# Patient Record
Sex: Female | Born: 1960
Health system: Southern US, Community
[De-identification: ages and names within clinical notes are randomized; demographics above are authoritative.]

## PROBLEM LIST (undated history)

## (undated) DIAGNOSIS — T7840XA Allergy, unspecified, initial encounter: Secondary | ICD-10-CM

## (undated) DIAGNOSIS — R002 Palpitations: Secondary | ICD-10-CM

## (undated) DIAGNOSIS — L509 Urticaria, unspecified: Secondary | ICD-10-CM

## (undated) DIAGNOSIS — R569 Unspecified convulsions: Secondary | ICD-10-CM

## (undated) DIAGNOSIS — G4730 Sleep apnea, unspecified: Secondary | ICD-10-CM

## (undated) DIAGNOSIS — F32A Depression, unspecified: Secondary | ICD-10-CM

## (undated) DIAGNOSIS — R9439 Abnormal result of other cardiovascular function study: Secondary | ICD-10-CM

## (undated) DIAGNOSIS — F419 Anxiety disorder, unspecified: Secondary | ICD-10-CM

## (undated) DIAGNOSIS — E119 Type 2 diabetes mellitus without complications: Secondary | ICD-10-CM

## (undated) DIAGNOSIS — T783XXA Angioneurotic edema, initial encounter: Secondary | ICD-10-CM

## (undated) DIAGNOSIS — G4733 Obstructive sleep apnea (adult) (pediatric): Secondary | ICD-10-CM

## (undated) DIAGNOSIS — R350 Frequency of micturition: Secondary | ICD-10-CM

## (undated) DIAGNOSIS — R519 Headache, unspecified: Secondary | ICD-10-CM

## (undated) DIAGNOSIS — R51 Headache: Secondary | ICD-10-CM

## (undated) DIAGNOSIS — I10 Essential (primary) hypertension: Secondary | ICD-10-CM

## (undated) DIAGNOSIS — R351 Nocturia: Secondary | ICD-10-CM

## (undated) DIAGNOSIS — G510 Bell's palsy: Secondary | ICD-10-CM

## (undated) DIAGNOSIS — M1990 Unspecified osteoarthritis, unspecified site: Secondary | ICD-10-CM

## (undated) DIAGNOSIS — F329 Major depressive disorder, single episode, unspecified: Secondary | ICD-10-CM

## (undated) DIAGNOSIS — G43909 Migraine, unspecified, not intractable, without status migrainosus: Secondary | ICD-10-CM

## (undated) DIAGNOSIS — I509 Heart failure, unspecified: Secondary | ICD-10-CM

## (undated) DIAGNOSIS — Z9289 Personal history of other medical treatment: Secondary | ICD-10-CM

## (undated) DIAGNOSIS — E7800 Pure hypercholesterolemia, unspecified: Secondary | ICD-10-CM

## (undated) DIAGNOSIS — G40909 Epilepsy, unspecified, not intractable, without status epilepticus: Secondary | ICD-10-CM

## (undated) DIAGNOSIS — R609 Edema, unspecified: Secondary | ICD-10-CM

## (undated) DIAGNOSIS — N189 Chronic kidney disease, unspecified: Secondary | ICD-10-CM

## (undated) DIAGNOSIS — G709 Myoneural disorder, unspecified: Secondary | ICD-10-CM

## (undated) DIAGNOSIS — D649 Anemia, unspecified: Secondary | ICD-10-CM

## (undated) HISTORY — DX: Personal history of other medical treatment: Z92.89

## (undated) HISTORY — PX: CARPAL TUNNEL RELEASE: SHX101

## (undated) HISTORY — PX: HAND SURGERY: SHX662

## (undated) HISTORY — DX: Angioneurotic edema, initial encounter: T78.3XXA

## (undated) HISTORY — DX: Palpitations: R00.2

## (undated) HISTORY — DX: Bell's palsy: G51.0

## (undated) HISTORY — DX: Abnormal result of other cardiovascular function study: R94.39

## (undated) HISTORY — DX: Pure hypercholesterolemia, unspecified: E78.00

## (undated) HISTORY — PX: BACK SURGERY: SHX140

## (undated) HISTORY — DX: Heart failure, unspecified: I50.9

## (undated) HISTORY — DX: Obstructive sleep apnea (adult) (pediatric): G47.33

## (undated) HISTORY — DX: Epilepsy, unspecified, not intractable, without status epilepticus: G40.909

## (undated) HISTORY — DX: Migraine, unspecified, not intractable, without status migrainosus: G43.909

## (undated) HISTORY — DX: Type 2 diabetes mellitus without complications: E11.9

## (undated) HISTORY — DX: Essential (primary) hypertension: I10

## (undated) HISTORY — DX: Edema, unspecified: R60.9

## (undated) HISTORY — DX: Chronic kidney disease, unspecified: N18.9

## (undated) HISTORY — DX: Urticaria, unspecified: L50.9

## (undated) HISTORY — DX: Allergy, unspecified, initial encounter: T78.40XA

## (undated) HISTORY — PX: WRIST SURGERY: SHX841

## (undated) HISTORY — DX: Morbid (severe) obesity due to excess calories: E66.01

---
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
## 1980-05-03 HISTORY — PX: TUBAL LIGATION: SHX77

## 1988-05-03 HISTORY — PX: BILATERAL SALPINGOOPHORECTOMY: SHX1223

## 1988-05-03 HISTORY — PX: ABDOMINAL HYSTERECTOMY: SHX81

## 1993-05-03 HISTORY — PX: LAMINECTOMY: SHX219

## 2004-06-17 ENCOUNTER — Ambulatory Visit: Payer: Self-pay | Admitting: Unknown Physician Specialty

## 2005-07-13 ENCOUNTER — Ambulatory Visit: Payer: Self-pay | Admitting: Family Medicine

## 2005-10-21 ENCOUNTER — Other Ambulatory Visit: Payer: Self-pay

## 2005-10-21 ENCOUNTER — Emergency Department: Payer: Self-pay | Admitting: Internal Medicine

## 2005-11-12 DIAGNOSIS — F341 Dysthymic disorder: Secondary | ICD-10-CM | POA: Insufficient documentation

## 2005-11-12 DIAGNOSIS — G43109 Migraine with aura, not intractable, without status migrainosus: Secondary | ICD-10-CM | POA: Insufficient documentation

## 2005-11-12 DIAGNOSIS — E782 Mixed hyperlipidemia: Secondary | ICD-10-CM | POA: Insufficient documentation

## 2005-11-12 DIAGNOSIS — G40909 Epilepsy, unspecified, not intractable, without status epilepticus: Secondary | ICD-10-CM | POA: Insufficient documentation

## 2005-11-12 DIAGNOSIS — E119 Type 2 diabetes mellitus without complications: Secondary | ICD-10-CM | POA: Insufficient documentation

## 2006-01-10 ENCOUNTER — Encounter: Payer: Self-pay | Admitting: Endocrinology

## 2006-03-11 ENCOUNTER — Encounter: Payer: Self-pay | Admitting: Endocrinology

## 2006-03-22 DIAGNOSIS — G4733 Obstructive sleep apnea (adult) (pediatric): Secondary | ICD-10-CM | POA: Insufficient documentation

## 2006-04-13 ENCOUNTER — Encounter: Payer: Self-pay | Admitting: Endocrinology

## 2006-10-25 ENCOUNTER — Encounter: Payer: Self-pay | Admitting: Endocrinology

## 2006-11-23 ENCOUNTER — Ambulatory Visit: Payer: Self-pay | Admitting: Gastroenterology

## 2006-11-23 LAB — HM COLONOSCOPY

## 2006-12-30 ENCOUNTER — Encounter: Payer: Self-pay | Admitting: Endocrinology

## 2007-01-16 ENCOUNTER — Encounter: Payer: Self-pay | Admitting: Endocrinology

## 2007-05-08 ENCOUNTER — Ambulatory Visit: Payer: Self-pay | Admitting: Endocrinology

## 2007-05-08 DIAGNOSIS — R609 Edema, unspecified: Secondary | ICD-10-CM | POA: Insufficient documentation

## 2007-05-08 DIAGNOSIS — R197 Diarrhea, unspecified: Secondary | ICD-10-CM | POA: Insufficient documentation

## 2007-05-08 DIAGNOSIS — I10 Essential (primary) hypertension: Secondary | ICD-10-CM

## 2007-05-08 DIAGNOSIS — G4730 Sleep apnea, unspecified: Secondary | ICD-10-CM | POA: Insufficient documentation

## 2007-05-08 DIAGNOSIS — E669 Obesity, unspecified: Secondary | ICD-10-CM | POA: Insufficient documentation

## 2007-05-08 DIAGNOSIS — E1169 Type 2 diabetes mellitus with other specified complication: Secondary | ICD-10-CM | POA: Insufficient documentation

## 2007-05-08 LAB — CONVERTED CEMR LAB
CO2: 27 meq/L (ref 19–32)
Calcium: 10.3 mg/dL (ref 8.4–10.5)
Chloride: 101 meq/L (ref 96–112)
GFR calc Af Amer: 116 mL/min
GFR calc non Af Amer: 96 mL/min
Potassium: 4.3 meq/L (ref 3.5–5.1)

## 2007-05-09 LAB — CONVERTED CEMR LAB
Calcium, Total (PTH): 10.5 mg/dL (ref 8.4–10.5)
PTH: 87.1 pg/mL — ABNORMAL HIGH (ref 14.0–72.0)

## 2007-05-10 ENCOUNTER — Ambulatory Visit: Payer: Self-pay | Admitting: Gastroenterology

## 2007-06-08 ENCOUNTER — Telehealth (INDEPENDENT_AMBULATORY_CARE_PROVIDER_SITE_OTHER): Payer: Self-pay | Admitting: *Deleted

## 2007-06-23 ENCOUNTER — Ambulatory Visit: Payer: Self-pay | Admitting: Endocrinology

## 2007-06-23 DIAGNOSIS — R519 Headache, unspecified: Secondary | ICD-10-CM | POA: Insufficient documentation

## 2007-06-23 DIAGNOSIS — R51 Headache: Secondary | ICD-10-CM | POA: Insufficient documentation

## 2007-07-05 ENCOUNTER — Encounter: Payer: Self-pay | Admitting: Endocrinology

## 2007-07-21 ENCOUNTER — Ambulatory Visit: Payer: Self-pay | Admitting: Endocrinology

## 2007-07-24 LAB — CONVERTED CEMR LAB
Creatinine,U: 261 mg/dL
Microalb, Ur: 6.1 mg/dL — ABNORMAL HIGH (ref 0.0–1.9)

## 2007-08-21 ENCOUNTER — Ambulatory Visit: Payer: Self-pay | Admitting: Endocrinology

## 2007-10-09 ENCOUNTER — Ambulatory Visit: Payer: Self-pay | Admitting: Family Medicine

## 2007-10-23 DIAGNOSIS — S63639A Sprain of interphalangeal joint of unspecified finger, initial encounter: Secondary | ICD-10-CM | POA: Insufficient documentation

## 2007-11-09 ENCOUNTER — Ambulatory Visit: Payer: Self-pay | Admitting: Endocrinology

## 2007-11-09 LAB — CONVERTED CEMR LAB: Hgb A1c MFr Bld: 7.7 % — ABNORMAL HIGH (ref 4.6–6.0)

## 2009-02-17 DIAGNOSIS — R5381 Other malaise: Secondary | ICD-10-CM | POA: Insufficient documentation

## 2009-02-19 ENCOUNTER — Ambulatory Visit: Payer: Self-pay | Admitting: Endocrinology

## 2009-02-19 LAB — CONVERTED CEMR LAB
Creatinine,U: 214 mg/dL
Hgb A1c MFr Bld: 6.4 % (ref 4.6–6.5)
Microalb Creat Ratio: 0.9 mg/g (ref 0.0–30.0)

## 2009-04-07 ENCOUNTER — Ambulatory Visit: Payer: Self-pay | Admitting: Unknown Physician Specialty

## 2009-04-23 ENCOUNTER — Ambulatory Visit: Payer: Self-pay | Admitting: Pain Medicine

## 2009-06-09 ENCOUNTER — Ambulatory Visit: Payer: Self-pay | Admitting: Pain Medicine

## 2009-06-19 ENCOUNTER — Ambulatory Visit: Payer: Self-pay | Admitting: Endocrinology

## 2009-07-27 ENCOUNTER — Emergency Department: Payer: Self-pay | Admitting: Emergency Medicine

## 2009-08-13 ENCOUNTER — Ambulatory Visit: Payer: Self-pay | Admitting: Pain Medicine

## 2009-11-13 DIAGNOSIS — I509 Heart failure, unspecified: Secondary | ICD-10-CM | POA: Insufficient documentation

## 2009-11-14 ENCOUNTER — Encounter: Admission: RE | Admit: 2009-11-14 | Discharge: 2009-11-14 | Payer: Self-pay | Admitting: Cardiology

## 2010-05-03 HISTORY — PX: KNEE SURGERY: SHX244

## 2010-07-10 ENCOUNTER — Ambulatory Visit: Payer: Self-pay

## 2010-07-20 ENCOUNTER — Emergency Department: Payer: Self-pay | Admitting: Unknown Physician Specialty

## 2010-08-18 ENCOUNTER — Ambulatory Visit: Payer: Self-pay | Admitting: Unknown Physician Specialty

## 2010-09-03 ENCOUNTER — Ambulatory Visit: Payer: Self-pay | Admitting: Unknown Physician Specialty

## 2010-09-10 ENCOUNTER — Ambulatory Visit: Payer: Self-pay | Admitting: Unknown Physician Specialty

## 2010-10-12 ENCOUNTER — Ambulatory Visit: Payer: Self-pay

## 2011-05-17 ENCOUNTER — Other Ambulatory Visit: Payer: Self-pay | Admitting: Family Medicine

## 2011-05-17 NOTE — Telephone Encounter (Signed)
please call patient: i last saw this pt 2 1/2 years ago, and that was only for dm.  pcp is [place], in [place]

## 2011-10-02 DIAGNOSIS — R002 Palpitations: Secondary | ICD-10-CM

## 2011-10-02 HISTORY — DX: Palpitations: R00.2

## 2011-11-15 ENCOUNTER — Ambulatory Visit: Payer: Self-pay | Admitting: Family Medicine

## 2011-11-17 ENCOUNTER — Ambulatory Visit: Payer: Self-pay | Admitting: Family Medicine

## 2012-01-07 LAB — HM DIABETES EYE EXAM

## 2012-04-19 ENCOUNTER — Ambulatory Visit: Payer: Self-pay | Admitting: Family Medicine

## 2012-04-19 LAB — HM MAMMOGRAPHY

## 2012-12-29 ENCOUNTER — Encounter: Payer: Self-pay | Admitting: *Deleted

## 2013-01-02 ENCOUNTER — Encounter: Payer: Self-pay | Admitting: Cardiology

## 2013-01-02 ENCOUNTER — Ambulatory Visit (INDEPENDENT_AMBULATORY_CARE_PROVIDER_SITE_OTHER): Payer: Medicare Other | Admitting: Cardiology

## 2013-01-02 VITALS — BP 102/60 | HR 73 | Ht 62.0 in | Wt 203.5 lb

## 2013-01-02 DIAGNOSIS — G4730 Sleep apnea, unspecified: Secondary | ICD-10-CM

## 2013-01-02 DIAGNOSIS — R5383 Other fatigue: Secondary | ICD-10-CM

## 2013-01-02 DIAGNOSIS — R609 Edema, unspecified: Secondary | ICD-10-CM

## 2013-01-02 DIAGNOSIS — E669 Obesity, unspecified: Secondary | ICD-10-CM | POA: Insufficient documentation

## 2013-01-02 DIAGNOSIS — R002 Palpitations: Secondary | ICD-10-CM

## 2013-01-02 DIAGNOSIS — Z79899 Other long term (current) drug therapy: Secondary | ICD-10-CM

## 2013-01-02 DIAGNOSIS — I10 Essential (primary) hypertension: Secondary | ICD-10-CM

## 2013-01-02 DIAGNOSIS — R5381 Other malaise: Secondary | ICD-10-CM

## 2013-01-02 DIAGNOSIS — E782 Mixed hyperlipidemia: Secondary | ICD-10-CM

## 2013-01-02 MED ORDER — EZETIMIBE-SIMVASTATIN 10-40 MG PO TABS: 1.0000 | ORAL_TABLET | Freq: Every day | ORAL | Status: DC

## 2013-01-02 NOTE — Patient Instructions (Addendum)
For your fatigue - I think that this may be the that you need CPAP - I will re-put in a referral to see Dr. Claiborne Billings to get this set up -- to see within 1 month.  I also want you to cut back your Metoprolol dose to 1/2 tab daily for ~1 month to see if that helps. If not , go back to regular dose.  I will check your cholesterol levels.  I will also check Vein dopplers in your legs to evaluate the L leg swelling.  Leonie Man, MD

## 2013-01-04 ENCOUNTER — Other Ambulatory Visit: Payer: Self-pay | Admitting: Cardiology

## 2013-01-05 ENCOUNTER — Other Ambulatory Visit: Payer: Self-pay | Admitting: *Deleted

## 2013-01-05 ENCOUNTER — Ambulatory Visit (HOSPITAL_COMMUNITY)
Admission: RE | Admit: 2013-01-05 | Discharge: 2013-01-05 | Disposition: A | Discharge status: Home / Self Care | Payer: Medicare Other | Source: Ambulatory Visit | Attending: Cardiovascular Disease | Admitting: Cardiovascular Disease

## 2013-01-05 DIAGNOSIS — R609 Edema, unspecified: Secondary | ICD-10-CM | POA: Insufficient documentation

## 2013-01-05 DIAGNOSIS — M7989 Other specified soft tissue disorders: Secondary | ICD-10-CM

## 2013-01-05 LAB — LIPID PANEL
HDL: 58 mg/dL (ref >39)
LDL Cholesterol: 155 mg/dL — ABNORMAL HIGH (ref 0–99)
Total CHOL/HDL Ratio: 4 Ratio
Triglycerides: 97 mg/dL (ref <150)
VLDL: 19 mg/dL (ref 0–40)

## 2013-01-05 LAB — COMPREHENSIVE METABOLIC PANEL
AST: 16 U/L (ref 0–37)
Albumin: 3.9 g/dL (ref 3.5–5.2)
Alkaline Phosphatase: 64 U/L (ref 39–117)
CO2: 26 mEq/L (ref 19–32)
Chloride: 107 mEq/L (ref 96–112)
Creat: 1.17 mg/dL — ABNORMAL HIGH (ref 0.50–1.10)
Glucose, Bld: 114 mg/dL — ABNORMAL HIGH (ref 70–99)
Total Protein: 7.2 g/dL (ref 6.0–8.3)

## 2013-01-05 MED ORDER — METOPROLOL SUCCINATE ER 25 MG PO TB24: 25.0000 mg | ORAL_TABLET | Freq: Two times a day (BID) | ORAL | Status: DC

## 2013-01-05 NOTE — Progress Notes (Signed)
Lower Extremity Venous Duplex Completed. °Brianna L Mazza,RVT °

## 2013-01-11 ENCOUNTER — Telehealth: Payer: Self-pay | Admitting: *Deleted

## 2013-01-11 NOTE — Telephone Encounter (Signed)
Spoke to patient. Results given.

## 2013-01-11 NOTE — Telephone Encounter (Signed)
Message copied by Raiford Simmonds on Thu Jan 11, 2013  4:41 PM ------      Message from: [facility], DAVID      Created: Fri Jan 05, 2013  7:36 PM       Kidney function is a little bit abnormal -- may be due to fasting.            Cholesterol - is well above goal -- TC 232 - goal < 200, LDL minimum goal < 130.            Leonie Man, MD       ------

## 2013-01-15 ENCOUNTER — Encounter: Payer: Self-pay | Admitting: Cardiology

## 2013-01-15 DIAGNOSIS — E782 Mixed hyperlipidemia: Secondary | ICD-10-CM | POA: Insufficient documentation

## 2013-01-15 DIAGNOSIS — Z79899 Other long term (current) drug therapy: Secondary | ICD-10-CM | POA: Insufficient documentation

## 2013-01-15 DIAGNOSIS — R002 Palpitations: Secondary | ICD-10-CM | POA: Insufficient documentation

## 2013-01-15 DIAGNOSIS — R5383 Other fatigue: Secondary | ICD-10-CM | POA: Insufficient documentation

## 2013-01-15 DIAGNOSIS — E1169 Type 2 diabetes mellitus with other specified complication: Secondary | ICD-10-CM | POA: Insufficient documentation

## 2013-01-15 NOTE — Assessment & Plan Note (Signed)
Her CPAP does any working correctly. Refer back to Dr. Claiborne Billings for adjustment and titration.

## 2013-01-15 NOTE — Assessment & Plan Note (Signed)
These palpitations seem to be relatively well-controlled. I don't think it to be contracture problem with acne of her beta blocker. If they do get worse with low-dose beta blocker then we have to reassess our treatment options.

## 2013-01-15 NOTE — Assessment & Plan Note (Signed)
Is multifactorial. It may very well to do with her CPAP not working as well as it had in the past. I recommend that she be referred back to Dr. Claiborne Billings to reassess her CPAP settings and machine.  I am also the back of her beta blocker dose to one half the current dose to see if that affects her symptoms at all.

## 2013-01-15 NOTE — Assessment & Plan Note (Signed)
She is on Vytorin. She is due recheck of her lipids and chemistry levels.

## 2013-01-15 NOTE — Assessment & Plan Note (Signed)
We discussed dietary modification and exercise. She is definitely trying to do this but it put back on the weight that she lost before. She is somewhat demoralized by that. I think some opacity with her fatigue but this may also make her fatigued.

## 2013-01-15 NOTE — Progress Notes (Signed)
PCP: Renato Shin, MD  Clinic Note: Chief Complaint  Patient presents with  . Annual Exam    no chest pain, no sob, edema,dx bronchitis -deslysm    HPI: Abigail Figueroa is a 52 y.o. female with a PMH below who presents today for annual followup. She initially presented in the summer of 2013 for evaluation of palpitations. Prior to that she had had cardiology evaluation with an echocardiogram and nuclear stress test by Dr. Tina Griffiths.  Interval History: She is now on low-dose beta blocker, and her copies of the month but told. She does episodes that come and go but for the most part are relatively well controlled. Her most notable thing is that she has left greater than right edema with varicose veins and mild venous stasis dermatitis. He does note some cramping in the legs and heaviness. She otherwise denies any PND, orthopnea or other heart failure symptoms. No chest pain or dyspnea with rest or exertion. She does note fatigue with just feeling somewhat tired in the day and overall energy. She is just getting over a bout of bronchitis. She be treated with azithromycin. She is noting that the coughing is gotten better but no active wheezing.  The remainder of Cardiovascular ROS: negative for - loss of consciousness, murmur, orthopnea, paroxysmal nocturnal dyspnea, rapid heart rate or shortness of breath is as follows: Additional cardiac review of systems: Lightheadedness - no, dizziness - no, syncope/near-syncope - no; TIA/amaurosis fugax - no Melena - no, hematochezia no; hematuria - no; nosebleeds - no; claudication - no  Past Medical History  Diagnosis Date  . Hypertension   . Edema   . Heart palpitations June 2013    Event monitor showing sinus tachycardia, PACs with couplets and triplets.  . Obesity   . Diabetes   . Hypercholesterolemia   . Seizure disorder   . Migraine   . OSA (obstructive sleep apnea)     has been on continuous positive airway pressure  . Hx of  echocardiogram 11/18/2009    showed normal left ventricular function, mild left ventricular hypertrophy, and no significant valve abnormalities.  . History of stress test 11/25/2009    Normal myocardial perfusion imaging    Prior Cardiac Evaluation and Past Surgical History: Past Surgical History  Procedure Laterality Date  . Cholecystectomy  1980s  . Appendectomy  1980s  . Tubal ligation  1982  . Abdominal hysterectomy  1990    without BSO  . Laminectomy  1995  . Back surgery  1900s 2001    x2 with "cage put in"  . Carpal tunnel release      Allergies  Allergen Reactions  . Morphine Sulfate     REACTION: swelling    Current Outpatient Prescriptions  Medication Sig Dispense Refill  . aspirin 81 MG tablet Take 81 mg by mouth daily.      Marland Kitchen azithromycin (ZITHROMAX) 250 MG tablet       . baclofen (LIORESAL) 10 MG tablet Take 10 mg by mouth 2 (two) times daily as needed.      Marland Kitchen BOTOX 100 UNITS SOLR injection       . carbamazepine (TEGRETOL XR) 400 MG 12 hr tablet Take 400 mg by mouth 2 (two) times daily.      . chlorproMAZINE (THORAZINE) 25 MG tablet Take 25 mg by mouth as needed.      . ezetimibe-simvastatin (VYTORIN) 10-40 MG per tablet Take 1 tablet by mouth at bedtime.  28 tablet  0  .  furosemide (LASIX) 40 MG tablet Take 40 mg by mouth daily.      Marland Kitchen ketoprofen (ORUDIS) 75 MG capsule       . lamoTRIgine (LAMICTAL) 100 MG tablet Take 100 mg by mouth 2 (two) times daily.      . metFORMIN (GLUCOPHAGE) 500 MG tablet Take 2 tablets twice daily and 1 tablet at lunch.      . metolazone (ZAROXOLYN) 2.5 MG tablet Take 2.5 mg by mouth as needed.      . metoprolol succinate (TOPROL-XL) 25 MG 24 hr tablet Take 1 tablet (25 mg total) by mouth 2 (two) times daily. Or As Directed  180 tablet  3  . potassium chloride (K-DUR) 10 MEQ tablet Take 10 mEq by mouth daily.      Marland Kitchen venlafaxine (EFFEXOR) 75 MG tablet Take 150 mg by mouth daily.        No current facility-administered medications for  this visit.    History   Social History Narrative   She is a married mother of 2, grandmother 3. She tries to get exercise but is not doing any routine program.   She quit smoking over 25 years ago does not drink alcohol.    ROS: A comprehensive Review of Systems - Symptoms noted above. she has definitely gained weight however since last visit her weight loss or as 191 pounds and a drop of 206 pounds. Currently is 203 pounds with a BMI of 33 to 37. Her CPAP has not been working as well, and she is unable to tolerate it  PHYSICAL EXAM BP 102/60  Pulse 73  Ht 5\' 2"  (1.575 m)  Wt 203 lb 8 oz (92.307 kg)  BMI 37.21 kg/m2 General appearance: alert, cooperative, appears stated age, no distress, moderately obese and morbidly obese Neck: no adenopathy, no carotid bruit, no JVD and supple, symmetrical, trachea midline Lungs: clear to auscultation bilaterally, normal percussion bilaterally and Nonlabored. Good Air movement Heart: regular rate and rhythm, S1, S2 normal, no murmur, click, rub or gallop and normal apical impulse Abdomen: soft, non-tender; bowel sounds normal; no masses,  no organomegaly Extremities: edema 1-2 +, varicose veins noted and venous stasis dermatitis noted Pulses: 2+ and symmetric Neurologic: Alert and oriented X 3, normal strength and tone. Normal symmetric reflexes. Normal coordination and gait  [id number] today: Yes Rate: 73 , Rhythm: Normal Sinus Rhythm, Normal ECG;   Recent Labs: None recently  ASSESSMENT / PLAN: Fatigue Is multifactorial. It may very well to do with her CPAP not working as well as it had in the past. I recommend that she be referred back to Dr. Claiborne Billings to reassess her CPAP settings and machine.  I am also the back of her beta blocker dose to one half the current dose to see if that affects her symptoms at all.  Heart palpation These palpitations seem to be relatively well-controlled. I don't think it to be contracture problem with acne of  her beta blocker. If they do get worse with low-dose beta blocker then we have to reassess our treatment options.  Mixed hyperlipidemia She is on Vytorin. She is due recheck of her lipids and chemistry levels.  Obesity (BMI 30-39.9) We discussed dietary modification and exercise. She is definitely trying to do this but it put back on the weight that she lost before. She is somewhat demoralized by that. I think some opacity with her fatigue but this may also make her fatigued.  Edema It was like she may very well  have some venous stasis disease. Will order Lotrimin venous Dopplers of his peripheral veins to see if there is any evidence of valvular insufficiency that would be potentially inflatable. I did talk about using support hose and possibly the compression stockings depending what the Dopplers show.  SLEEP APNEA on sleep apnea Her CPAP does any working correctly. Refer back to Dr. Claiborne Billings for adjustment and titration.  HYPERTENSION Very well-controlled accommodation beta blocker and ACE inhibitor. In fact she may be a little borderline hypotensive. Back no beta blocker would help that.   Orders Placed This Encounter  Procedures  . Comprehensive metabolic panel    Order Specific Question:  Has the patient fasted?    Answer:  Yes  . Lipid panel    Order Specific Question:  Has the patient fasted?    Answer:  Yes  . EKG 12-Lead    Scheduling Instructions:     [facility] Specific Question:  Where should this test be performed    Answer:  OTHER  . Lower Extremity Venous Duplex Bilateral    L > R LE Edema - eval for reflux    Standing Status: Future     Number of Occurrences: 1     Standing Expiration Date: 01/02/2014    Order Specific Question:  Laterality    Answer:  Bilateral    Order Specific Question:  Where should this test be performed:    Answer:  [facility]    Followup: One month  Ecko Beasley W. Ellyn Hack, M.D., M.S. [facility]  [facility] [address] [place],   [address]  [phone] Pager # [phone]

## 2013-01-15 NOTE — Assessment & Plan Note (Signed)
Very well-controlled accommodation beta blocker and ACE inhibitor. In fact she may be a little borderline hypotensive. Back no beta blocker would help that.

## 2013-01-15 NOTE — Assessment & Plan Note (Signed)
It was like she may very well have some venous stasis disease. Will order Lotrimin venous Dopplers of his peripheral veins to see if there is any evidence of valvular insufficiency that would be potentially inflatable. I did talk about using support hose and possibly the compression stockings depending what the Dopplers show.

## 2013-01-24 ENCOUNTER — Telehealth: Payer: Self-pay | Admitting: Cardiology

## 2013-01-24 NOTE — Telephone Encounter (Signed)
Message forwarded to Dr. Ellyn Hack.

## 2013-01-24 NOTE — Telephone Encounter (Signed)
Returned call and left message w/ instructions by MD/PA.  Call [phone] before 4pm with any additional questions or inquiries.

## 2013-01-24 NOTE — Telephone Encounter (Signed)
While I would love to put her on an ACE inhibitor for her diabetes, I just started low dose of beta blocker on her.  Her blood pressures were relatively low I saw her.  I don't think she needs a blood pressure medication at this time.  But her blood pressure were to increase, I would consider starting an ACE inhibitor/ARB.  Leonie Man, MD

## 2013-01-24 NOTE — Telephone Encounter (Signed)
Pt have diabetes and high blood pressure.She is not ace inhibitor or ARB. Recommendind doctor starts her on one of those medication.If she takes the medicine it decreases her risk of having a heart attack or stroke.If doctor agree he can send in a new prescription to any pharmacy.

## 2013-01-29 ENCOUNTER — Telehealth: Payer: Self-pay | Admitting: *Deleted

## 2013-01-29 NOTE — Telephone Encounter (Signed)
Formed faxed back to  [facility] -concerning initiate an ace /arb- recommendation not to start due to  Hypotension per Ellyn Hack.

## 2013-02-02 ENCOUNTER — Ambulatory Visit (INDEPENDENT_AMBULATORY_CARE_PROVIDER_SITE_OTHER): Payer: Medicare Other | Admitting: Cardiovascular Disease

## 2013-02-02 ENCOUNTER — Encounter: Payer: Self-pay | Admitting: Cardiovascular Disease

## 2013-02-02 VITALS — BP 130/90 | HR 66 | Ht 63.0 in | Wt 210.3 lb

## 2013-02-02 DIAGNOSIS — R5383 Other fatigue: Secondary | ICD-10-CM

## 2013-02-02 DIAGNOSIS — R5381 Other malaise: Secondary | ICD-10-CM

## 2013-02-02 DIAGNOSIS — R609 Edema, unspecified: Secondary | ICD-10-CM

## 2013-02-02 DIAGNOSIS — G4730 Sleep apnea, unspecified: Secondary | ICD-10-CM

## 2013-02-02 DIAGNOSIS — E119 Type 2 diabetes mellitus without complications: Secondary | ICD-10-CM

## 2013-02-02 DIAGNOSIS — R002 Palpitations: Secondary | ICD-10-CM

## 2013-02-02 DIAGNOSIS — E669 Obesity, unspecified: Secondary | ICD-10-CM

## 2013-02-02 DIAGNOSIS — I10 Essential (primary) hypertension: Secondary | ICD-10-CM

## 2013-02-02 NOTE — Progress Notes (Signed)
Patient ID: Abigail Figueroa, female   DOB: [birth year], 52 y.o.   MRN: [id number]       HPI: Abigail Figueroa, is a 52 y.o. female referred for evaluation of untreated sleep apnea.  Ms. Corrales is a 52 year old African American female who is followed by Dr. Ellyn Hack for cardiology care. She has a history of hypertension, palpitations, peripheral edema, hyperlipidemia, type 2 diabetes mellitus, depression, as well as migraines. In 2007, she apparently underwent a sleep study which was interpreted by Dr. Yancey Flemings. I do not have the diagnostic polysomnogram report but I do have the CPAP titration trial. She ultimately was titrated up to an 8 cm water pressure. She states she initially used CPAP therapy for several years but has not used this in over 2 years when she was starting to have difficulty with the machine over these last several years, her sleep is very poor. She wakes up frequently. According to her husband her snoring is exceptionally loud. She can fall sleep essentially anytime during the day. She also does note some palpitations she typically goes to bed at 11 PM and wakes up anywhere from 4-5 and possibly 6 AM. Her sleep is nonrestorative she presents now for evaluation.  Her Epworth sleepiness scale as noted below reveals severe excessive daytime sleepiness.  Epworth Sleepiness Scale: Situation   Chance of Dozing/Sleeping (0 = never , 1 = slight chance , 2 = moderate chance , 3 = high chance )   sitting and reading 3   watching TV 3   sitting inactive in a public place 3   being a passenger in a motor vehicle for an hour or more 3   lying down in the afternoon 3   sitting and talking to someone 3   sitting quietly after lunch (no alcohol) 3   while stopped for a few minutes in traffic as the driver 0   Total Score  21    Past Medical History  Diagnosis Date  . Hypertension   . Edema   . Heart palpitations June 2013    Event monitor showing sinus tachycardia, PACs with couplets  and triplets.  . Obesity   . Diabetes   . Hypercholesterolemia   . Seizure disorder   . Migraine   . OSA (obstructive sleep apnea)     has been on continuous positive airway pressure  . Hx of echocardiogram 11/18/2009    showed normal left ventricular function, mild left ventricular hypertrophy, and no significant valve abnormalities.  . History of stress test 11/25/2009    Normal myocardial perfusion imaging    Past Surgical History  Procedure Laterality Date  . Cholecystectomy  1980s  . Appendectomy  1980s  . Tubal ligation  1982  . Abdominal hysterectomy  1990    without BSO  . Laminectomy  1995  . Back surgery  1900s 2001    x2 with "cage put in"  . Carpal tunnel release      Allergies  Allergen Reactions  . Morphine Sulfate     REACTION: swelling    Current Outpatient Prescriptions  Medication Sig Dispense Refill  . aspirin 81 MG tablet Take 81 mg by mouth daily.      . baclofen (LIORESAL) 10 MG tablet Take 10 mg by mouth 2 (two) times daily as needed.      Marland Kitchen BOTOX 100 UNITS SOLR injection       . carbamazepine (TEGRETOL XR) 400 MG 12 hr tablet Take 400 mg by  mouth 2 (two) times daily.      . chlorproMAZINE (THORAZINE) 25 MG tablet Take 25 mg by mouth as needed.      . diclofenac (CATAFLAM) 50 MG tablet Take 1 tablet by mouth as needed.      . ezetimibe-simvastatin (VYTORIN) 10-40 MG per tablet Take 1 tablet by mouth at bedtime.  28 tablet  0  . furosemide (LASIX) 40 MG tablet Take 40 mg by mouth daily.      Marland Kitchen ketoprofen (ORUDIS) 75 MG capsule       . lamoTRIgine (LAMICTAL) 100 MG tablet Take 100 mg by mouth 2 (two) times daily.      . metFORMIN (GLUCOPHAGE) 500 MG tablet Take 2 tablets twice daily and 1 tablet at lunch.      . metolazone (ZAROXOLYN) 2.5 MG tablet Take 2.5 mg by mouth as needed.      . metoprolol succinate (TOPROL-XL) 25 MG 24 hr tablet Take 1 tablet (25 mg total) by mouth 2 (two) times daily. Or As Directed  180 tablet  3  . ONE TOUCH ULTRA TEST  test strip       . potassium chloride (K-DUR) 10 MEQ tablet Take 10 mEq by mouth daily.      Marland Kitchen venlafaxine (EFFEXOR) 75 MG tablet Take 150 mg by mouth daily.        No current facility-administered medications for this visit.    Social history is notable that she is married has 2 children and 3 grandchildren. She does not routinely exercise. She quit smoking greater than 25 years ago. No alcohol use.  ROS negative for fever, chills or night sweats. She does admit to high migraine headaches. They're times been some episodes of depression. She does have palpitations. She denies chest pressure. She does note some mild shortness of breath with activity. She denies abdominal pain. She denies change in bowel bladder habits. She denies GU issues. She at times notes leg swelling. She denies claudication. She denies a rash. She does have diabetes. She is unaware of thyroid issues. From a sleep perspective, she is unaware bruxism. She does have loud snoring. She does have nonrestorative sleep and excessive residual daytime sleepiness. She is unaware of restless legs. She denies hypnagogic hallucinations. p Other system review is negative.  PE BP 130/90  Pulse 66  Ht 5\' 3"  (1.6 m)  Wt 210 lb 4.8 oz (95.391 kg)  BMI 37.26 kg/m2  General: Alert, oriented, no distress.  Skin: normal turgor, no rashes HEENT: Normocephalic, atraumatic. Pupils round and reactive; sclera anicteric; Fundi mild arteriolar narrowing Nose without nasal septal hypertrophy Mouth/Parynx benign; Mallinpatti scale 4 Neck: Thick neck No JVD, no carotid briuts Lungs: clear to ausculatation and percussion; no wheezing or rales Heart: RRR, s1 s2 normal 1/6 systolic murmur. No S3 gallop Abdomen: Mildly obese; soft, nontender; no hepatosplenomehaly, BS+; abdominal aorta nontender and not dilated by palpation. Pulses 2+ Extremities: Trivial edema, no clubbinbg cyanosis, Homan's sign negative  Neurologic: grossly nonfocal Psychological:  Quiet affect.   LABS:  BMET    Component Value Date/Time   NA 140 01/05/2013 0943   K 4.0 01/05/2013 0943   CL 107 01/05/2013 0943   CO2 26 01/05/2013 0943   GLUCOSE 114* 01/05/2013 0943   BUN 17 01/05/2013 0943   CREATININE 1.17* 01/05/2013 0943   CREATININE 0.7 05/08/2007 0823   CALCIUM 10.0 01/05/2013 0943   CALCIUM 10.5 05/08/2007 2235   GFRNONAA 96 05/08/2007 0823   GFRAA 116 05/08/2007 [id number]  Hepatic Function Panel     Component Value Date/Time   PROT 7.2 01/05/2013 0943   ALBUMIN 3.9 01/05/2013 0943   AST 16 01/05/2013 0943   ALT 16 01/05/2013 0943   ALKPHOS 64 01/05/2013 0943   BILITOT 0.3 01/05/2013 0943     CBC No results found for this basename: wbc, rbc, hgb, hct, plt, mcv, mch, mchc, rdw, neutrabs, lymphsabs, monoabs, eosabs, basosabs     BNP No results found for this basename: probnp    Lipid Panel     Component Value Date/Time   CHOL 232* 01/05/2013 0943   TRIG 97 01/05/2013 0943   HDL 58 01/05/2013 0943   CHOLHDL 4.0 01/05/2013 0943   VLDL 19 01/05/2013 0943   LDLCALC 155* 01/05/2013 0943     RADIOLOGY: No results found.    ASSESSMENT AND PLAN: I had a long discussion with Ms. Sleppy and her husband. She does have a history of sleep apnea that dates back greater than 7 years. Initially she did use CPAP therapy with benefit but had difficulties with her mask. She has not used therapy in excess of 2 years. She does have significant excessive daytime sleepiness presently. Her sleep pattern is very poor. I reviewed the normal sleep architecture cycle and the adverse consequences if sleep apnea is left untreated particularly with reference to cardiovascular health. This undoubtedly has been contributory to her blood pressure probable palpitations. I'm scheduling her for a diagnostics polysomnogram and CPAP titration trial. If she meet split-night criteria this would be done in one night. She will need to be reinstituted with CPAP therapy I will then see her back in a sleep clinic for  followup evaluation     Troy Sine, MD, [facility]  02/02/2013 10:19 AM

## 2013-02-02 NOTE — Patient Instructions (Addendum)
Your physician has recommended that you have a sleep study. This test records several body functions during sleep, including: brain activity, eye movement, oxygen and carbon dioxide blood levels, heart rate and rhythm, breathing rate and rhythm, the flow of air through your mouth and nose, snoring, body muscle movements, and chest and belly movement.   Your physician recommends that you schedule a follow-up appointment in: 2-3 months in a sleep clinic.

## 2013-03-05 ENCOUNTER — Ambulatory Visit: Payer: Self-pay | Admitting: Family Medicine

## 2013-03-05 LAB — POCT ERYTHROCYTE SEDIMENTATION RATE, NON-AUTOMATED: Sed Rate: 29 mm

## 2013-03-07 DIAGNOSIS — M545 Low back pain, unspecified: Secondary | ICD-10-CM | POA: Insufficient documentation

## 2013-04-09 ENCOUNTER — Telehealth: Payer: Self-pay | Admitting: Cardiology

## 2013-04-09 MED ORDER — EZETIMIBE-SIMVASTATIN 10-40 MG PO TABS: 1.0000 | ORAL_TABLET | Freq: Every day | ORAL | Status: DC

## 2013-04-09 NOTE — Telephone Encounter (Signed)
Needs to get new rx for Vytorin 10-40 mg sent to [facility] on [address] [phone].  She has been getting samples but lives in [place] now and not convenient.  [facility] needs script  Please call

## 2013-04-09 NOTE — Telephone Encounter (Signed)
Rx was sent to pharmacy electronically. 

## 2013-04-30 ENCOUNTER — Ambulatory Visit: Payer: Medicare Other | Admitting: Cardiovascular Disease

## 2013-06-28 ENCOUNTER — Ambulatory Visit: Payer: Medicare Other | Admitting: Cardiovascular Disease

## 2013-06-29 ENCOUNTER — Other Ambulatory Visit: Payer: Self-pay | Admitting: Cardiology

## 2013-06-29 NOTE — Telephone Encounter (Signed)
Rx was sent to pharmacy electronically. 

## 2013-08-07 ENCOUNTER — Ambulatory Visit: Payer: Medicare Other | Admitting: Cardiovascular Disease

## 2013-08-09 ENCOUNTER — Encounter: Payer: Self-pay | Admitting: Neurology

## 2013-08-09 ENCOUNTER — Ambulatory Visit (INDEPENDENT_AMBULATORY_CARE_PROVIDER_SITE_OTHER): Payer: Medicare Other | Admitting: Neurology

## 2013-08-09 VITALS — BP 105/70 | HR 76 | Temp 98.1°F | Ht 63.0 in | Wt 208.9 lb

## 2013-08-09 DIAGNOSIS — R259 Unspecified abnormal involuntary movements: Secondary | ICD-10-CM

## 2013-08-09 DIAGNOSIS — R569 Unspecified convulsions: Secondary | ICD-10-CM

## 2013-08-09 DIAGNOSIS — G40909 Epilepsy, unspecified, not intractable, without status epilepticus: Secondary | ICD-10-CM

## 2013-08-09 DIAGNOSIS — Z79899 Other long term (current) drug therapy: Secondary | ICD-10-CM

## 2013-08-09 DIAGNOSIS — R251 Tremor, unspecified: Secondary | ICD-10-CM

## 2013-08-09 LAB — TSH
TSH: 1.42 u[IU]/mL (ref 0.350–4.500)
TSH: 1.42 u[IU]/mL (ref <=5.90)

## 2013-08-09 LAB — COMPREHENSIVE METABOLIC PANEL
ALK PHOS: 89 U/L (ref 39–117)
ALT: 23 U/L (ref 0–35)
AST: 23 U/L (ref 0–37)
Albumin: 4.1 g/dL (ref 3.5–5.2)
BILIRUBIN TOTAL: 0.2 mg/dL (ref 0.2–1.2)
BUN: 21 mg/dL (ref 6–23)
CO2: 29 mEq/L (ref 19–32)
Calcium: 9.9 mg/dL (ref 8.4–10.5)
Chloride: 104 mEq/L (ref 96–112)
Creat: 1.11 mg/dL — ABNORMAL HIGH (ref 0.50–1.10)
Glucose, Bld: 151 mg/dL — ABNORMAL HIGH (ref 70–99)
Potassium: 4.2 mEq/L (ref 3.5–5.3)
SODIUM: 140 meq/L (ref 135–145)
TOTAL PROTEIN: 7.1 g/dL (ref 6.0–8.3)

## 2013-08-09 LAB — CBC
HCT: 32.7 % — ABNORMAL LOW (ref 36.0–46.0)
Hemoglobin: 10.7 g/dL — ABNORMAL LOW (ref 12.0–15.0)
MCH: 26.9 pg (ref 26.0–34.0)
MCHC: 32.7 g/dL (ref 30.0–36.0)
MCV: 82.2 fL (ref 78.0–100.0)
Platelets: 233 10*3/uL (ref 150–400)
RBC: 3.98 MIL/uL (ref 3.87–5.11)
RDW: 15.9 % — ABNORMAL HIGH (ref 11.5–15.5)
WBC: 5.3 10*3/uL (ref 4.0–10.5)

## 2013-08-09 LAB — VITAMIN B12: Vitamin B-12: 772 pg/mL (ref 211–911)

## 2013-08-09 NOTE — Patient Instructions (Signed)
Follow up as needed

## 2013-08-09 NOTE — Progress Notes (Signed)
Abigail Figueroa was seen today in neurologic consultation at the request of Chrismon, DENNIS E, PA.  The consultation is for the evaluation of trremor.  This patient is accompanied in the office by her child who supplements the history.   The patient is a 53 y.o. year old female with a history of tremor.  The first symptom began 9 months ago.  Pts daughter states that it started gradual.  It is in both hands.  It is most noticeable when holding things.  Never at rest.  No family hx of similar  Affected by caffeine:  no  Drinks 1-2 large coffee cups per day Affected by alcohol:  unknown Affected by stress:  no Affected by fatigue:  no Spills soup if on spoon:  no  (but daughter notes that she will sometimes have to put the soup spoon down) Spills glass of liquid if full:  sometimes Affects ADL's (tying shoes, brushing teeth, etc):  no  Pt reports hx of seizure that began in her twenties.  She reports that with her first event she passed out without warning and "had the shakes."    She states that she had up to 20 other similar events at the time and was seeing a neurologist at [facility] at the time and was told that she had a small abnormality on the brain and was told that she needed lifetime medication.  Been on Tegretol since in her twenties.    Neuroimaging has previously been performed.  It is not available for my review today.  ALLERGIES:   Allergies  Allergen Reactions  . Morphine Sulfate     REACTION: swelling  . Oxycontin [Oxycodone Hcl] Swelling    CURRENT MEDICATIONS:  Current Outpatient Prescriptions on File Prior to Visit  Medication Sig Dispense Refill  . baclofen (LIORESAL) 10 MG tablet Take 10 mg by mouth 2 (two) times daily as needed.      Marland Kitchen BOTOX 100 UNITS SOLR injection       . carbamazepine (TEGRETOL XR) 400 MG 12 hr tablet Take 400 mg by mouth 2 (two) times daily.      . chlorproMAZINE (THORAZINE) 25 MG tablet Take 25 mg by mouth as needed.      .  ezetimibe-simvastatin (VYTORIN) 10-40 MG per tablet Take 1 tablet by mouth at bedtime.  30 tablet  5  . furosemide (LASIX) 40 MG tablet TAKE 1 TABLET BY MOUTH DAILY  30 tablet  7  . ketoprofen (ORUDIS) 75 MG capsule       . lamoTRIgine (LAMICTAL) 100 MG tablet Take 100 mg by mouth 2 (two) times daily.      . metFORMIN (GLUCOPHAGE) 500 MG tablet Take 2 tablets twice daily and 1 tablet at lunch.      . metolazone (ZAROXOLYN) 2.5 MG tablet Take 2.5 mg by mouth as needed.      . metoprolol succinate (TOPROL-XL) 25 MG 24 hr tablet Take 1 tablet (25 mg total) by mouth 2 (two) times daily. Or As Directed  180 tablet  3  . ONE TOUCH ULTRA TEST test strip       . potassium chloride (K-DUR) 10 MEQ tablet Take 10 mEq by mouth daily.      Marland Kitchen venlafaxine (EFFEXOR) 75 MG tablet Take 150 mg by mouth daily.        No current facility-administered medications on file prior to visit.    PAST MEDICAL HISTORY:   Past Medical History  Diagnosis Date  .  Hypertension   . Edema   . Heart palpitations June 2013    Event monitor showing sinus tachycardia, PACs with couplets and triplets.  . Obesity   . Diabetes   . Hypercholesterolemia   . Seizure disorder   . Migraine   . OSA (obstructive sleep apnea)     has been on continuous positive airway pressure  . Hx of echocardiogram 11/18/2009    showed normal left ventricular function, mild left ventricular hypertrophy, and no significant valve abnormalities.  . History of stress test 11/25/2009    Normal myocardial perfusion imaging    PAST SURGICAL HISTORY:   Past Surgical History  Procedure Laterality Date  . Cholecystectomy  1980s  . Appendectomy  1980s  . Tubal ligation  1982  . Abdominal hysterectomy  1990    without BSO  . Laminectomy  1995  . Back surgery  1900s 2001    x2 with "cage put in"  . Carpal tunnel release      SOCIAL HISTORY:   History   Social History  . Marital Status: Married    Spouse Name: N/A    Number of Children: N/A    . Years of Education: N/A   Occupational History  . Not on file.   Social History Main Topics  . Smoking status: Former Smoker    Quit date: 01/03/1979  . Smokeless tobacco: Never Used  . Alcohol Use: No  . Drug Use: No  . Sexual Activity: Not on file   Other Topics Concern  . Not on file   Social History Narrative   She is a married mother of 2, grandmother 3. She tries to get exercise but is not doing any routine program.   She quit smoking over 25 years ago does not drink alcohol.    FAMILY HISTORY:   Family Status  Relation Status Death Age  . Mother Deceased   . Maternal Grandmother Deceased   . Paternal Grandmother Deceased   . Paternal Grandfather Deceased     ROS:  A complete 10 system review of systems was obtained and was unremarkable apart from what is mentioned above.  PHYSICAL EXAMINATION:    VITALS:   Filed Vitals:   08/09/13 0908  BP: 105/70  Pulse: 76  Temp: 98.1 F (36.7 C)  TempSrc: Oral  Height: 5\' 3"  (1.6 m)  Weight: 208 lb 14.4 oz (94.756 kg)    GEN:  Normal appears female in no acute distress.  Appears stated age. HEENT:  Normocephalic, atraumatic. The mucous membranes are moist. The superficial temporal arteries are without ropiness or tenderness. Cardiovascular: Regular rate and rhythm. Lungs: Clear to auscultation bilaterally. Neck/Heme: There are no carotid bruits noted bilaterally.  NEUROLOGICAL: Orientation:  The patient is alert and oriented x 3.  Fund of knowledge is appropriate.  Recent and remote memory intact.  Attention span and concentration normal.  Repeats and names without difficulty. Cranial nerves: There is good facial symmetry. The pupils are equal round and reactive to light bilaterally. Fundoscopic exam reveals clear disc margins bilaterally. Extraocular muscles are intact and visual fields are full to confrontational testing. Speech is fluent and clear. Soft palate rises symmetrically and there is no tongue deviation.  Hearing is intact to conversational tone. Tone: Tone is good throughout. Sensation: Sensation is intact to light touch and pinprick throughout (facial, trunk, extremities). Vibration is intact at the bilateral big toe. There is no extinction with double simultaneous stimulation. There is no sensory dermatomal level identified. Coordination:  The patient has no difficulty with RAM's or FNF bilaterally. Motor: Strength is 5/5 in the bilateral upper and lower extremities.  Shoulder shrug is equal and symmetric. There is no pronator drift.  There are no fasciculations noted. DTR's: Deep tendon reflexes are 2/4 at the bilateral biceps, triceps, brachioradialis, patella and achilles.  Plantar responses are downgoing bilaterally. Gait and Station: The patient is able to ambulate without difficulty. The patient is able to heel toe walk without any difficulty. The patient is able to ambulate in a tandem fashion. The patient is able to stand in the Romberg position.  Movement examination:  The patient was able to draw Archimedes spirals with both hands without difficulty.  She writes a sentence without difficulty, both in print and in cursive.  She is able to pour water from one glass to another without spilling it.   IMPRESSION/PLAN  1. Tremor  -I saw absolutely no tremor on examination today.  I suspect that perhaps she has some enhanced physiologic tremor at times, and perhaps she has some intermittent tremor from medication (Tegretol has been reported to cause tremor at times).  I do not think that she has benign essential tremor.  I saw no evidence of any type of neurodegenerative condition.  I am going to do some lab work including TSH, carbamazepine level, CBC, chemistry and I asked the patient to call me next week for the results, or she can get on my chart.  I do not think that she needs further medication and neither does the patient. 2.  Hx of seizure, well controlled on tegretol  - I don't have old  records.  Pt is happy with the medication and satisified with prior care and work up.   3.  F/u prn.

## 2013-08-14 ENCOUNTER — Telehealth: Payer: Self-pay | Admitting: Neurology

## 2013-08-14 NOTE — Telephone Encounter (Signed)
Spoke with patient. She had these labs drawn on 08/09/2013. I called [facility] and they are faxing results. Carbamazepine level not yet resulted.

## 2013-08-14 NOTE — Telephone Encounter (Signed)
Please let pt know that she was anemic on recent labs and she should f/u with PCP.  Fax copy of labs to PCP.  Tell her it could be reason for intermittent tremor.  I am still waiting on tegretol level as well.

## 2013-08-14 NOTE — Telephone Encounter (Signed)
Spoke with patient and she was made aware lab results, results faxed to Vernie Murders, [place] at [phone]. Confirmation received.

## 2013-08-16 LAB — CARBAMAZEPINE, FREE AND TOTAL
CARBAMAZEPINE METABOLITE -: 1.2 ug/mL (ref 0.2–2.0)
CARBAMAZEPINE METABOLITE/: 0.6 ug/mL
CARBAMAZEPINE METABOLITE: 0.6 ug/mL (ref 0.1–1.0)
CARBAMAZEPINE, BOUND: 3.8 ug/mL
Carbamazepine, Free: 1.1 ug/mL (ref 1.0–3.0)
Carbamazepine, Total: 4.9 ug/mL (ref 4.0–12.0)

## 2013-08-31 ENCOUNTER — Other Ambulatory Visit: Payer: Self-pay

## 2013-08-31 MED ORDER — METOLAZONE 2.5 MG PO TABS: 2.5000 mg | ORAL_TABLET | ORAL | Status: DC | PRN

## 2013-09-05 ENCOUNTER — Telehealth: Payer: Self-pay | Admitting: *Deleted

## 2013-09-05 NOTE — Telephone Encounter (Signed)
Dr. Claiborne Billings reviewed download faxed to him from [facility]. He gave me orders to increase the patient's CPAP pressure to 10cmH20. Patient informed of this change. She also requested [place] to do a order for a new  Face mask. Order for [employer] fit F-10 faxed along with increased pressure order to [facility] 800 # as well as [facility] #.

## 2013-09-10 ENCOUNTER — Telehealth: Payer: Self-pay | Admitting: *Deleted

## 2013-09-10 NOTE — Telephone Encounter (Signed)
Called patient to inform her per [facility] FTF needed with Dr. Claiborne Billings.  Appointment scheduled for this week.

## 2013-09-12 ENCOUNTER — Encounter: Payer: Self-pay | Admitting: Cardiovascular Disease

## 2013-09-12 ENCOUNTER — Ambulatory Visit (INDEPENDENT_AMBULATORY_CARE_PROVIDER_SITE_OTHER): Payer: Medicare Other | Admitting: Cardiovascular Disease

## 2013-09-12 VITALS — BP 120/72 | HR 84 | Ht 63.0 in | Wt 211.4 lb

## 2013-09-12 DIAGNOSIS — G4730 Sleep apnea, unspecified: Secondary | ICD-10-CM

## 2013-09-12 DIAGNOSIS — E782 Mixed hyperlipidemia: Secondary | ICD-10-CM

## 2013-09-12 DIAGNOSIS — R609 Edema, unspecified: Secondary | ICD-10-CM

## 2013-09-12 DIAGNOSIS — E669 Obesity, unspecified: Secondary | ICD-10-CM

## 2013-09-12 DIAGNOSIS — R002 Palpitations: Secondary | ICD-10-CM

## 2013-09-12 DIAGNOSIS — I10 Essential (primary) hypertension: Secondary | ICD-10-CM

## 2013-09-12 NOTE — Progress Notes (Signed)
Patient ID: Abigail Figueroa, female   DOB: 08-Aug-1960, 53 y.o.   MRN: [id number]        HPI: Abigail Figueroa is a 53 y.o. female of structural sleep apnea evaluation.  Abigail Figueroa is a 53 year old African American female who has a history of hypertension, palpitations, peripheral edema, hyperlipidemia, type 2 diabetes mellitus, depression, as well as migraines. In 2007, she underwent a sleep study which was interpreted by Dr. Chancy Milroy. I do not have the diagnostic polysomnogram report but I do have the CPAP titration trial. She ultimately was titrated up to an 53 cm water pressure. She states she initially used CPAP therapy for several years but had not used this in over 2 years.  I had seen her in October 2014 and over several years several years her sleep had been very poor with frequent awakenings and loud snoring At that time, Her Epworth sleepiness scale revealed severe excessive daytime sleepiness.  Epworth Sleepiness Scale: Situation   Chance of Dozing/Sleeping (0 = never , 1 = slight chance , 2 = moderate chance , 3 = high chance )   sitting and reading 3   watching TV 3   sitting inactive in a public place 3   being a passenger in a motor vehicle for an hour or more 3   lying down in the afternoon 3   sitting and talking to someone 3   sitting quietly after lunch (no alcohol) 3   while stopped for a few minutes in traffic as the driver 0   Total Score  21   Subsequent, she started on a new machine with an S9 leak series.  A download from 05/09/2013 to 08/06/2013 showed reduced compliance averaging only 23% of the days.  Greater than 4 hours.  Upon further questioning, the patient states that she had difficulty with allergies and is nasal congestion.  She was using nasal pillows.  Over the past week, her mask was switched to a [employer] F 10.  She feels this is significantly better and she is now able to sleep.  Over the past week, she is using her CPAP with 100% compliance.  She  is set at an 8 cm water pressure.  A repeat Epworth Sleepiness Scale today endorsed at 14, but she has only been on this new mask for one week.  On further questioning, the patient states that she wants me to assume her cardiology care.  She does have a history of hyperlipidemia, as well as peripheral edema, and hypertension, and type 2 diabetes mellitus.  She does note her pulse rate to be typically in the 80s to 90s.  She has been only taking half of her metoprolol succinate dose twice a day, but she is uncertain if this is a 25 mg or a 50 mg tablet, but believes this may be just a 25 mg tablet.   Past Medical History  Diagnosis Date  . Hypertension   . Edema   . Heart palpitations June 2013    Event monitor showing sinus tachycardia, PACs with couplets and triplets.  . Obesity   . Diabetes   . Hypercholesterolemia   . Seizure disorder   . Migraine   . OSA (obstructive sleep apnea)     has been on continuous positive airway pressure  . Hx of echocardiogram 11/18/2009    showed normal left ventricular function, mild left ventricular hypertrophy, and no significant valve abnormalities.  . History of stress test 11/25/2009  Normal myocardial perfusion imaging    Past Surgical History  Procedure Laterality Date  . Cholecystectomy  1980s  . Appendectomy  1980s  . Tubal ligation  1982  . Abdominal hysterectomy  1990    without BSO  . Laminectomy  1995  . Back surgery  1900s 2001    x2 with "cage put in"  . Carpal tunnel release      Allergies  Allergen Reactions  . Morphine Sulfate     REACTION: swelling  . Oxycontin [Oxycodone Hcl] Swelling    Current Outpatient Prescriptions  Medication Sig Dispense Refill  . baclofen (LIORESAL) 10 MG tablet Take 10 mg by mouth 2 (two) times daily as needed.      Marland Kitchen BOTOX 100 UNITS SOLR injection       . carbamazepine (TEGRETOL XR) 400 MG 12 hr tablet Take 400 mg by mouth 2 (two) times daily.      . chlorproMAZINE (THORAZINE) 25 MG  tablet Take 25 mg by mouth as needed.      . ezetimibe-simvastatin (VYTORIN) 10-40 MG per tablet Take 1 tablet by mouth at bedtime.  30 tablet  5  . furosemide (LASIX) 40 MG tablet TAKE 1 TABLET BY MOUTH DAILY  30 tablet  7  . ketoprofen (ORUDIS) 75 MG capsule       . lamoTRIgine (LAMICTAL) 100 MG tablet Take 100 mg by mouth 2 (two) times daily.      . metFORMIN (GLUCOPHAGE) 500 MG tablet Take 2 tablets twice daily and 1 tablet at lunch.      . metolazone (ZAROXOLYN) 2.5 MG tablet Take 1 tablet (2.5 mg total) by mouth as needed.  30 tablet  0  . metoprolol succinate (TOPROL-XL) 25 MG 24 hr tablet Take 1 tablet (25 mg total) by mouth 2 (two) times daily. Or As Directed  180 tablet  3  . ONE TOUCH ULTRA TEST test strip       . potassium chloride (K-DUR) 10 MEQ tablet Take 10 mEq by mouth daily.      Marland Kitchen venlafaxine (EFFEXOR) 75 MG tablet Take 150 mg by mouth daily.       Marland Kitchen MYRBETRIQ 25 MG TB24 tablet Take 1 tablet by mouth daily.       No current facility-administered medications for this visit.    Social history is notable that she is married has 2 children and 3 grandchildren. She does not routinely exercise. She quit smoking greater than 25 years ago. No alcohol use.  ROS negative for fever, chills or night sweats. She does admit to  migraine headaches. Therehas been  some episodes of depression. She does have palpitations. She denies chest pressure. She does note some mild shortness of breath with activity. She denies abdominal pain. She denies change in bowel bladder habits. She denies GU issues. She at times notes leg swelling. She denies claudication. She denies a rash. She does have diabetes. She is unaware of thyroid issues.  She denies recent seizures. From a sleep perspective, she is unaware bruxism. She does have loud snoring, without CPAP therapy, but is unaware of snoring with treatment.  Her sleep has improved with her new mask, but there still is mild residual daytime sleepiness. She is  unaware of restless legs. She denies hypnagogic hallucinations.  Other comprehensive 14 point system review is negative.  PE BP 120/72  Pulse 84  Ht 5\' 3"  (1.6 m)  Wt 211 lb 6.4 oz (95.89 kg)  BMI 37.46 kg/m2  General:  Alert, oriented, no distress.  Skin: normal turgor, no rashes HEENT: Normocephalic, atraumatic. Pupils round and reactive; sclera anicteric; Fundi mild arteriolar narrowing Nose without nasal septal hypertrophy Mouth/Parynx benign; Mallinpatti scale 4 Neck: Thick neck No JVD, no carotid briuts; normal carotid upstroke Lungs: clear to ausculatation and percussion; no wheezing or rales Chest wall: Nontender to palpation Heart: RRR, s1 s2 normal 1/6 systolic murmur. No S3 gallop.  No diastolic murmur, no rubs, thrills, or she Abdomen: Mildly obese; soft, nontender; no hepatosplenomehaly, BS+; abdominal aorta nontender and not dilated by palpation. Pulses 2+ Extremities: Trivial edema, no clubbinbg cyanosis, Homan's sign negative  Neurologic: grossly nonfocal Psychological: Quiet affect.  ECG (independently read by me and (sinus rhythm at 84 beats per minute.  Nonspecific T changes.  QTc interval 411 milliseconds.   LABS:  BMET    Component Value Date/Time   NA 140 08/09/2013 1022   K 4.2 08/09/2013 1022   CL 104 08/09/2013 1022   CO2 29 08/09/2013 1022   GLUCOSE 151* 08/09/2013 1022   BUN 21 08/09/2013 1022   CREATININE 1.11* 08/09/2013 1022   CREATININE 0.7 05/08/2007 0823   CALCIUM 9.9 08/09/2013 1022   CALCIUM 10.5 05/08/2007 2235   GFRNONAA 96 05/08/2007 0823   GFRAA 116 05/08/2007 0823     Hepatic Function Panel     Component Value Date/Time   PROT 7.1 08/09/2013 1022   ALBUMIN 4.1 08/09/2013 1022   AST 23 08/09/2013 1022   ALT 23 08/09/2013 1022   ALKPHOS 89 08/09/2013 1022   BILITOT 0.2 08/09/2013 1022     CBC    Component Value Date/Time   WBC 5.3 08/09/2013 1022     BNP No results found for this basename: probnp    Lipid Panel     Component Value Date/Time    CHOL 232* 01/05/2013 0943   TRIG 97 01/05/2013 0943   HDL 58 01/05/2013 0943   CHOLHDL 4.0 01/05/2013 0943   VLDL 19 01/05/2013 0943   LDLCALC 155* 01/05/2013 0943     RADIOLOGY: No results found.    ASSESSMENT AND PLAN: Ms. Ubaldo has a history of sleep apnea that dates back greater than 7 years. Initially she did use CPAP therapy with benefit but had difficulties with her mask.  Since I had seen her in October 2014, she received a new CPAP unit.  She has had difficulty in being compliant standard secondary to nasal congestion and allergies.  She now has been changed to a [employer] Airfit F10, mask, and she notes marked improvement.  Over the past week.  She has used this consistently all night.  I will need to obtain a new download in approximately 30 days to further assess compliance and efficacy.  Presently, she is now well beta blocked.  On increasing her current dose of Toprol to one whole pill in the morning and half a pill at night for her present dose of one half pill twice a day.  I did provide her with samples of Vytorin.  She currently is stable from a blood pressure standpoint.  She's not having significant edema on her current dose of furosemide 40 mg.  She's not having any recurrent seizure activity on her Tegretol as well as Lamictal.  She is on metformin for diabetes mellitus.  I will see her in 6 months for reassessment or sooner if problems arise.     Troy Sine, MD, [facility]  09/12/2013 9:49 AM

## 2013-09-12 NOTE — Patient Instructions (Signed)
Your physician has recommended you make the following change in your medication: confirm MG of Metoprolol ER. Call office back with correct MG.. Whatever dose that you are taking increase the dose to 1 pill in the AM and 1/2 tablet in the PM per Dr. Evette Georges instructions.  [facility] will get a download on your CPAP machine in 30 days. Adjustments will be made accordingly if needed.  Your physician recommends that you schedule a follow-up appointment in 6 months.

## 2013-09-14 ENCOUNTER — Telehealth: Payer: Self-pay | Admitting: *Deleted

## 2013-09-14 NOTE — Telephone Encounter (Signed)
Faxed order for download in 4 weeks.

## 2013-09-26 ENCOUNTER — Telehealth: Payer: Self-pay | Admitting: Cardiovascular Disease

## 2013-09-26 NOTE — Telephone Encounter (Signed)
She said she was suppose to call back and let you know what the mg was of her Metoprolol ER.It is 25 mg,Please call this in today.Pt says she is completely out of it.

## 2013-09-27 MED ORDER — METOPROLOL SUCCINATE ER 25 MG PO TB24: ORAL_TABLET | ORAL | Status: DC

## 2013-09-27 NOTE — Telephone Encounter (Signed)
Patient has 25mg  tablets. Confirmed with her correct dosing per last OV with Dr. Claiborne Billings. Patient voiced understanding. Rx was sent to pharmacy electronically.

## 2013-12-04 ENCOUNTER — Other Ambulatory Visit: Payer: Self-pay | Admitting: *Deleted

## 2013-12-04 MED ORDER — EZETIMIBE-SIMVASTATIN 10-40 MG PO TABS: 1.0000 | ORAL_TABLET | Freq: Every day | ORAL | Status: DC

## 2014-02-13 ENCOUNTER — Other Ambulatory Visit: Payer: Self-pay | Admitting: Cardiology

## 2014-02-13 NOTE — Telephone Encounter (Signed)
Refill refused. Sent in on 09/27/13 #135 with 3 refills >> confirmed with pharmacy

## 2014-02-26 ENCOUNTER — Other Ambulatory Visit: Payer: Self-pay | Admitting: Cardiology

## 2014-02-26 NOTE — Telephone Encounter (Signed)
Rx was sent to pharmacy electronically. 

## 2014-03-08 DIAGNOSIS — M654 Radial styloid tenosynovitis [de Quervain]: Secondary | ICD-10-CM | POA: Insufficient documentation

## 2014-03-12 ENCOUNTER — Encounter: Payer: Self-pay | Admitting: Cardiovascular Disease

## 2014-03-12 ENCOUNTER — Ambulatory Visit (INDEPENDENT_AMBULATORY_CARE_PROVIDER_SITE_OTHER): Payer: Medicare Other | Admitting: Cardiovascular Disease

## 2014-03-12 VITALS — BP 150/90 | HR 73 | Ht 63.0 in | Wt 223.0 lb

## 2014-03-12 DIAGNOSIS — R609 Edema, unspecified: Secondary | ICD-10-CM

## 2014-03-12 DIAGNOSIS — E669 Obesity, unspecified: Secondary | ICD-10-CM

## 2014-03-12 DIAGNOSIS — E782 Mixed hyperlipidemia: Secondary | ICD-10-CM

## 2014-03-12 DIAGNOSIS — G4730 Sleep apnea, unspecified: Secondary | ICD-10-CM

## 2014-03-12 DIAGNOSIS — E119 Type 2 diabetes mellitus without complications: Secondary | ICD-10-CM

## 2014-03-12 DIAGNOSIS — I10 Essential (primary) hypertension: Secondary | ICD-10-CM

## 2014-03-12 MED ORDER — FUROSEMIDE 40 MG PO TABS: ORAL_TABLET | ORAL | Status: DC

## 2014-03-12 MED ORDER — LOSARTAN POTASSIUM-HCTZ 50-12.5 MG PO TABS: 1.0000 | ORAL_TABLET | Freq: Every day | ORAL | Status: DC

## 2014-03-12 NOTE — Patient Instructions (Signed)
Your physician has recommended you make the following change in your medication: start new prescription for losartan-hct 50-12.5. This has been sent to your pharmacy.  Your physician recommends that you schedule a follow-up appointment in: 3-4 months.  Your physician recommends that you return for lab work in: 3-4 days prior to your next visit.

## 2014-03-12 NOTE — Progress Notes (Signed)
Patient ID: MONIESHA MANERS, female   DOB: 31-Oct-1960, 53 y.o.   MRN: [id number]      HPI: RAYANN JIAN is a 53 y.o. female who presents for follow-up cardiology in sleep evaluation.  Ms. Leaphart is a 53 year old African American female who has a history of hypertension, palpitations, peripheral edema, hyperlipidemia, type 2 diabetes mellitus, depression, as well as migraines. In 2007, she underwent a sleep study which was interpreted by Dr. Chancy Milroy. I do not have the diagnostic polysomnogram report but I do have the CPAP titration trial. She ultimately was titrated up to an 8 cm water pressure. She states she initially used CPAP therapy for several years but had not used this in over 2 years.  I had seen her in October 2014 and over several years several years her sleep had been very poor with frequent awakenings and loud snoring At that time, Her Epworth sleepiness scale revealed severe excessive daytime sleepiness.  Epworth Sleepiness Scale: Situation   Chance of Dozing/Sleeping (0 = never , 1 = slight chance , 2 = moderate chance , 3 = high chance )   sitting and reading 3   watching TV 3   sitting inactive in a public place 3   being a passenger in a motor vehicle for an hour or more 3   lying down in the afternoon 3   sitting and talking to someone 3   sitting quietly after lunch (no alcohol) 3   while stopped for a few minutes in traffic as the driver 0   Total Score  21   Subsequently, she started on a new machine with an S9 Elite series. She now admits to using CPAP 100% of the time.  She is using a new Resmed Airfit F 10 mask.  At times she has nasal congestion.  When I last saw her, she requested that I assume her cardiology care.  She admits to a history of diabetes since 1999.   [place].  She had prescriptions for metolazone and furosemide but recently has not been taking this.  She has noticed some mild recurrent leg swelling.  She denies palpitations with her current dose  of Toprol-XL 25 mg in the morning and 12.5 mg in the evening.  She has a history of migraine headache for which she is on Lamictal.  She also takes Tegretol for seizure activity and denies recurrent events.  She also is on Wellbutrin SR 20 mg daily.  She presents for evaluation.  Past Medical History  Diagnosis Date  . Hypertension   . Edema   . Heart palpitations June 2013    Event monitor showing sinus tachycardia, PACs with couplets and triplets.  . Obesity   . Diabetes   . Hypercholesterolemia   . Seizure disorder   . Migraine   . OSA (obstructive sleep apnea)     has been on continuous positive airway pressure  . Hx of echocardiogram 11/18/2009    showed normal left ventricular function, mild left ventricular hypertrophy, and no significant valve abnormalities.  . History of stress test 11/25/2009    Normal myocardial perfusion imaging    Past Surgical History  Procedure Laterality Date  . Cholecystectomy  1980s  . Appendectomy  1980s  . Tubal ligation  1982  . Abdominal hysterectomy  1990    without BSO  . Laminectomy  1995  . Back surgery  1900s 2001    x2 with "cage put in"  . Carpal tunnel release  Allergies  Allergen Reactions  . Morphine Sulfate     REACTION: swelling  . Oxycontin [Oxycodone Hcl] Swelling, Hives and Itching    Current Outpatient Prescriptions  Medication Sig Dispense Refill  . baclofen (LIORESAL) 10 MG tablet Take 10 mg by mouth 2 (two) times daily as needed.    Marland Kitchen BOTOX 100 UNITS SOLR injection     . carbamazepine (TEGRETOL XR) 400 MG 12 hr tablet Take 400 mg by mouth 2 (two) times daily.    . chlorproMAZINE (THORAZINE) 25 MG tablet Take 25 mg by mouth as needed for nausea or vomiting.     . ezetimibe-simvastatin (VYTORIN) 10-40 MG per tablet Take 1 tablet by mouth at bedtime. 30 tablet 0  . fluocinonide ointment (LIDEX) [id number] % Apply 1 application topically 2 (two) times daily as needed.  1  . ketoprofen (ORUDIS) 75 MG capsule     .  lamoTRIgine (LAMICTAL) 100 MG tablet Take 100 mg by mouth 2 (two) times daily.    Marland Kitchen loratadine (CLARITIN) 10 MG tablet Take 1 tablet by mouth daily.  4  . metFORMIN (GLUCOPHAGE) 500 MG tablet Take 2 tablets twice daily and 1 tablet at lunch.    . metoprolol succinate (TOPROL-XL) 25 MG 24 hr tablet Take 1 tablet (25mg ) by mouth in morning and 1/2 tablet (12.5mg ) by mouth in evening. 135 tablet 3  . ONE TOUCH ULTRA TEST test strip     . potassium chloride (K-DUR) 10 MEQ tablet Take 10 mEq by mouth daily.    Marland Kitchen VIIBRYD 40 MG TABS Take 1 tablet by mouth daily.  1  . buPROPion (WELLBUTRIN SR) 200 MG 12 hr tablet Take 1 tablet by mouth daily.  1  . furosemide (LASIX) 40 MG tablet TAKE 1 TABLET BY MOUTH DAILY 30 tablet 7  . metolazone (ZAROXOLYN) 2.5 MG tablet Take 1 tablet (2.5 mg total) by mouth as needed. 30 tablet 0   No current facility-administered medications for this visit.    Social history is notable that she is married has 2 children and 3 grandchildren. She does not routinely exercise. She quit smoking greater than 25 years ago. No alcohol use.  ROS General: Negative; No fevers, chills, or night sweats;  HEENT: Negative; No changes in vision or hearing, sinus congestion, difficulty swallowing Pulmonary: Negative; No cough, wheezing, shortness of breath, hemoptysis Cardiovascular: Negative; No chest pain, presyncope, syncope, palpitations GI: Negative; No nausea, vomiting, diarrhea, or abdominal pain GU: Negative; No dysuria, hematuria, or difficulty voiding Musculoskeletal: Negative; no myalgias, joint pain, or weakness Hematologic/Oncology: Negative; no easy bruising, bleeding Endocrine: positive for diabetes Neuro: history of seizure disorder, stable.  Recently Skin: Negative; No rashes or skin lesions Psychiatric: Negative; No behavioral problems, depression Sleep: positive for obstructive sleep apnea with resolution of snoring as long as she uses CPAP; her previous residual  daytime sleepiness has significantly improved; no  bruxism, restless legs, hypnogognic hallucinations, no cataplexy Other comprehensive 14 point system review is negative.   PE BP 150/90 mmHg  Pulse 73  Ht 5\' 3"  (1.6 m)  Wt 223 lb (101.152 kg)  BMI 39.51 kg/m2  General: Alert, oriented, no distress.  Skin: normal turgor, no rashes HEENT: Normocephalic, atraumatic. Pupils round and reactive; sclera anicteric; Fundi mild arteriolar narrowing Nose without nasal septal hypertrophy Mouth/Parynx benign; Mallinpatti scale 4 Neck: Thick neck No JVD, no carotid briuts; normal carotid upstroke Lungs: clear to ausculatation and percussion; no wheezing or rales Chest wall: Nontender to palpation Heart: RRR, s1 s2  normal 1/6 systolic murmur. No S3 gallop.  No diastolic murmur, no rubs, thrills, or she Abdomen: Mildly obese; soft, nontender; no hepatosplenomehaly, BS+; abdominal aorta nontender and not dilated by palpation. Back: No CVA tenderness Pulses 2+ Extremities: Trivial edema, no clubbing cyanosis, Homan's sign negative  Neurologic: grossly nonfocal Psychological: Quiet affect.  ECG (independently read by me): Normal sinus rhythm at 73 bpm.  No ectopy.  ECG (independently read by me): sinus rhythm at 84 beats per minute.  Nonspecific T changes.  QTc interval 411 milliseconds.   LABS:  BMET    Component Value Date/Time   NA 140 08/09/2013 1022   K 4.2 08/09/2013 1022   CL 104 08/09/2013 1022   CO2 29 08/09/2013 1022   GLUCOSE 151* 08/09/2013 1022   BUN 21 08/09/2013 1022   CREATININE 1.11* 08/09/2013 1022   CREATININE 0.7 05/08/2007 0823   CALCIUM 9.9 08/09/2013 1022   CALCIUM 10.5 05/08/2007 2235   GFRNONAA 96 05/08/2007 0823   GFRAA 116 05/08/2007 0823     Hepatic Function Panel     Component Value Date/Time   PROT 7.1 08/09/2013 1022   ALBUMIN 4.1 08/09/2013 1022   AST 23 08/09/2013 1022   ALT 23 08/09/2013 1022   ALKPHOS 89 08/09/2013 1022   BILITOT 0.2  08/09/2013 1022     CBC    Component Value Date/Time   WBC 5.3 08/09/2013 1022     BNP No results found for: PROBNP  Lipid Panel     Component Value Date/Time   CHOL 232* 01/05/2013 0943   TRIG 97 01/05/2013 0943   HDL 58 01/05/2013 0943   CHOLHDL 4.0 01/05/2013 0943   VLDL 19 01/05/2013 0943   LDLCALC 155* 01/05/2013 0943     RADIOLOGY: No results found.    ASSESSMENT AND PLAN: Ms. Kornegay has a history of sleep apnea and presently continues to use CPAP with 100% compliance.   At times she does note some congestion while sleeping and I suggested she use nasal saline before going to bed and in the morning. She has tolerated her new mask.  She has a long-standing history of diabetes mellitus since 1999 and currently is on Glucophage 1000 mg morning and night and an additional 500 mg at lunch.  Her blood pressure today is elevated and was 150/90.  Previously she had been taking furosemide daily and remotely has had metolazone.  I recommended that she completely discontinue the metolazone.  She will take her Lasix only on an as-needed basis and I'm starting her on losartan HCT 50/12.5 which will be helpful both for her hypertension as well as mild leg swelling.  She will still have furosemide 20 mg on an as-needed basis.  If recurrent edema occurs.  She is tolerating Vytorin 10/40 for hyperlipidemia.  Her migraines have been stable. Her weight today is 223 pounds with a height of 53.  Her body mass index is 39.5 and approaching morbid obesity.  I strongly suggested weight loss and increased exercise.  I will see her in 3-4 months for follow-up evaluation.  Troy Sine, MD, [facility]  03/12/2014 11:24 AM

## 2014-03-14 DIAGNOSIS — E119 Type 2 diabetes mellitus without complications: Secondary | ICD-10-CM | POA: Insufficient documentation

## 2014-04-06 ENCOUNTER — Other Ambulatory Visit: Payer: Self-pay | Admitting: Cardiology

## 2014-04-06 NOTE — Telephone Encounter (Signed)
Rx was sent to pharmacy electronically. 

## 2014-04-19 ENCOUNTER — Ambulatory Visit: Payer: Self-pay

## 2014-04-19 ENCOUNTER — Ambulatory Visit: Payer: Self-pay | Admitting: Psychiatry

## 2014-04-19 DIAGNOSIS — M65319 Trigger thumb, unspecified thumb: Secondary | ICD-10-CM | POA: Insufficient documentation

## 2014-04-19 LAB — COMPREHENSIVE METABOLIC PANEL
ANION GAP: 5 — AB (ref 7–16)
Albumin: 3.5 g/dL (ref 3.4–5.0)
Alkaline Phosphatase: 105 U/L
BUN: 21 mg/dL — AB (ref 7–18)
Bilirubin,Total: 0.2 mg/dL (ref 0.2–1.0)
Calcium, Total: 10.5 mg/dL — ABNORMAL HIGH (ref 8.5–10.1)
Chloride: 106 mmol/L (ref 98–107)
Co2: 29 mmol/L (ref 21–32)
Creatinine: 1.35 mg/dL — ABNORMAL HIGH (ref 0.60–1.30)
EGFR (African American): 53 — ABNORMAL LOW
EGFR (Non-African Amer.): 44 — ABNORMAL LOW
Glucose: 181 mg/dL — ABNORMAL HIGH (ref 65–99)
OSMOLALITY: 287 (ref 275–301)
Potassium: 4.2 mmol/L (ref 3.5–5.1)
SGOT(AST): 31 U/L (ref 15–37)
SGPT (ALT): 38 U/L
Sodium: 140 mmol/L (ref 136–145)
Total Protein: 7.7 g/dL (ref 6.4–8.2)

## 2014-04-19 LAB — CBC WITH DIFFERENTIAL/PLATELET
Basophil #: 0.1 10*3/uL (ref 0.0–0.1)
Basophil %: 0.7 %
EOS PCT: 3.8 %
Eosinophil #: 0.3 10*3/uL (ref 0.0–0.7)
HCT: 33.7 % — AB (ref 35.0–47.0)
HGB: 10.8 g/dL — AB (ref 12.0–16.0)
LYMPHS ABS: 2.2 10*3/uL (ref 1.0–3.6)
LYMPHS PCT: 30.7 %
MCH: 27.3 pg (ref 26.0–34.0)
MCHC: 32.1 g/dL (ref 32.0–36.0)
MCV: 85 fL (ref 80–100)
Monocyte #: 0.4 x10 3/mm (ref 0.2–0.9)
Monocyte %: 5.4 %
NEUTROS PCT: 59.4 %
Neutrophil #: 4.2 10*3/uL (ref 1.4–6.5)
PLATELETS: 252 10*3/uL (ref 150–440)
RBC: 3.96 10*6/uL (ref 3.80–5.20)
RDW: 15.8 % — AB (ref 11.5–14.5)
WBC: 7.1 10*3/uL (ref 3.6–11.0)

## 2014-04-19 LAB — HEMOGLOBIN A1C: Hemoglobin A1C: 9 % — ABNORMAL HIGH (ref 4.2–6.3)

## 2014-04-19 LAB — LITHIUM LEVEL: LITHIUM: 0.67 mmol/L

## 2014-04-19 LAB — TSH: THYROID STIMULATING HORM: 2.06 u[IU]/mL

## 2014-05-31 DIAGNOSIS — M65311 Trigger thumb, right thumb: Secondary | ICD-10-CM | POA: Diagnosis not present

## 2014-05-31 DIAGNOSIS — M654 Radial styloid tenosynovitis [de Quervain]: Secondary | ICD-10-CM | POA: Diagnosis not present

## 2014-06-26 ENCOUNTER — Telehealth: Payer: Self-pay | Admitting: Cardiovascular Disease

## 2014-06-27 NOTE — Telephone Encounter (Signed)
Close encounter 

## 2014-07-11 ENCOUNTER — Ambulatory Visit: Payer: Medicare Other | Admitting: Cardiovascular Disease

## 2014-08-01 DIAGNOSIS — G43719 Chronic migraine without aura, intractable, without status migrainosus: Secondary | ICD-10-CM | POA: Diagnosis not present

## 2014-08-02 DIAGNOSIS — I10 Essential (primary) hypertension: Secondary | ICD-10-CM | POA: Diagnosis not present

## 2014-08-02 DIAGNOSIS — M25531 Pain in right wrist: Secondary | ICD-10-CM | POA: Diagnosis not present

## 2014-08-02 DIAGNOSIS — E119 Type 2 diabetes mellitus without complications: Secondary | ICD-10-CM | POA: Diagnosis not present

## 2014-08-16 ENCOUNTER — Other Ambulatory Visit
Admit: 2014-08-16 | Disposition: A | Discharge status: Home / Self Care | Payer: Self-pay | Attending: Family Medicine | Admitting: Family Medicine

## 2014-08-16 DIAGNOSIS — E119 Type 2 diabetes mellitus without complications: Secondary | ICD-10-CM | POA: Diagnosis not present

## 2014-08-16 LAB — CBC WITH DIFFERENTIAL/PLATELET
Basophil #: 0 10*3/uL (ref 0.0–0.1)
Basophil %: 0.7 %
Eosinophil #: 0.2 10*3/uL (ref 0.0–0.7)
Eosinophil %: 3.1 %
HCT: 35.6 % (ref 35.0–47.0)
HGB: 11.3 g/dL — ABNORMAL LOW (ref 12.0–16.0)
LYMPHS PCT: 28.6 %
Lymphocyte #: 1.8 10*3/uL (ref 1.0–3.6)
MCH: 26.7 pg (ref 26.0–34.0)
MCHC: 31.7 g/dL — AB (ref 32.0–36.0)
MCV: 84 fL (ref 80–100)
Monocyte #: 0.4 x10 3/mm (ref 0.2–0.9)
Monocyte %: 6.2 %
NEUTROS PCT: 61.4 %
Neutrophil #: 4 10*3/uL (ref 1.4–6.5)
Platelet: 259 10*3/uL (ref 150–440)
RBC: 4.22 10*6/uL (ref 3.80–5.20)
RDW: 15.2 % — ABNORMAL HIGH (ref 11.5–14.5)
WBC: 6.5 10*3/uL (ref 3.6–11.0)

## 2014-08-16 LAB — COMPREHENSIVE METABOLIC PANEL
ALBUMIN: 4 g/dL
ALK PHOS: 91 U/L
AST: 54 U/L — AB
Anion Gap: 10 (ref 7–16)
BUN: 18 mg/dL
Bilirubin,Total: 0.1 mg/dL — ABNORMAL LOW
CALCIUM: 10.4 mg/dL — AB
CO2: 24 mmol/L
Chloride: 110 mmol/L
Creatinine: 1.05 mg/dL — ABNORMAL HIGH
EGFR (Non-African Amer.): >60
GLUCOSE: 226 mg/dL — AB
Potassium: 4.5 mmol/L
SGPT (ALT): 49 U/L
SODIUM: 144 mmol/L
Total Protein: 7.8 g/dL

## 2014-08-16 LAB — LIPID PANEL
Cholesterol: 277 mg/dL — AB (ref 0–200)
Cholesterol: 277 mg/dL — ABNORMAL HIGH
HDL Cholesterol: 77 mg/dL
HDL: 77 mg/dL — AB (ref 35–70)
LDL CALC: 167 mg/dL
Ldl Cholesterol, Calc: 167 mg/dL — ABNORMAL HIGH
TRIGLYCERIDES: 167 mg/dL — AB (ref 40–160)
Triglycerides: 167 mg/dL — ABNORMAL HIGH
VLDL CHOLESTEROL, CALC: 33 mg/dL

## 2014-08-16 LAB — BASIC METABOLIC PANEL
BUN: 18 mg/dL (ref 4–21)
CREATININE: 1 mg/dL (ref 0.5–1.1)
Glucose: 226 mg/dL
Potassium: 4.5 mmol/L (ref 3.4–5.3)
Sodium: 144 mmol/L (ref 137–147)

## 2014-08-16 LAB — CBC AND DIFFERENTIAL
HEMATOCRIT: 36 % (ref 36–46)
HEMOGLOBIN: 11.3 g/dL — AB (ref 12.0–16.0)
NEUTROS ABS: 61 /uL
PLATELETS: 259 10*3/uL (ref 150–399)
WBC: 6.5 10^3/mL

## 2014-08-16 LAB — HEPATIC FUNCTION PANEL
ALT: 49 U/L — AB (ref 7–35)
AST: 54 U/L — AB (ref 13–35)
Alkaline Phosphatase: 91 U/L (ref 25–125)
Bilirubin, Total: 0.1 mg/dL

## 2014-08-16 LAB — HEMOGLOBIN A1C
HEMOGLOBIN A1C: 9.4 % — AB (ref 4.0–6.0)
Hemoglobin A1C: 9.4 % — ABNORMAL HIGH

## 2014-09-10 DIAGNOSIS — R569 Unspecified convulsions: Secondary | ICD-10-CM | POA: Insufficient documentation

## 2014-09-10 DIAGNOSIS — L309 Dermatitis, unspecified: Secondary | ICD-10-CM

## 2014-09-10 DIAGNOSIS — N3941 Urge incontinence: Secondary | ICD-10-CM | POA: Insufficient documentation

## 2014-09-10 DIAGNOSIS — M25539 Pain in unspecified wrist: Secondary | ICD-10-CM | POA: Insufficient documentation

## 2014-09-10 DIAGNOSIS — Z8639 Personal history of other endocrine, nutritional and metabolic disease: Secondary | ICD-10-CM | POA: Insufficient documentation

## 2014-09-10 DIAGNOSIS — K30 Functional dyspepsia: Secondary | ICD-10-CM | POA: Insufficient documentation

## 2014-09-10 DIAGNOSIS — M25559 Pain in unspecified hip: Secondary | ICD-10-CM | POA: Insufficient documentation

## 2014-09-10 DIAGNOSIS — R110 Nausea: Secondary | ICD-10-CM | POA: Insufficient documentation

## 2014-09-10 DIAGNOSIS — K921 Melena: Secondary | ICD-10-CM | POA: Insufficient documentation

## 2014-09-10 DIAGNOSIS — R002 Palpitations: Secondary | ICD-10-CM | POA: Insufficient documentation

## 2014-09-10 DIAGNOSIS — L659 Nonscarring hair loss, unspecified: Secondary | ICD-10-CM | POA: Insufficient documentation

## 2014-09-10 HISTORY — DX: Dermatitis, unspecified: L30.9

## 2014-09-11 DIAGNOSIS — M25532 Pain in left wrist: Secondary | ICD-10-CM | POA: Diagnosis not present

## 2014-09-11 DIAGNOSIS — M25531 Pain in right wrist: Secondary | ICD-10-CM | POA: Diagnosis not present

## 2014-10-02 ENCOUNTER — Telehealth: Payer: Self-pay | Admitting: Licensed Clinical Social Worker

## 2014-10-09 ENCOUNTER — Encounter: Payer: Self-pay | Admitting: Licensed Clinical Social Worker
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
## 2014-10-09 NOTE — Progress Notes (Signed)
### [facility] LCSW Progress Note:    Kenny Stern. Krouse 06/05/2014 12:41 PM Location: [facility] Patient #: [id number] DOB: 1961-02-26 Undefined / Language: [name] / Race: Black or African American Female    History of Present Illness(Larue Drawdy N Sye Schroepfer; 06/07/2014 9:41 AM) The patient is a 54 year old female who presents for a recheck of Depression. Symptoms include depressed mood (The patient is a 54 year old Married female with long history of depression, anxiety presented for the follow appointment. She reported that she would like to discuss about the adverse effects of Wellbutrin as she reported that the Wellbutrin is making her feels worse.She is becoming more agitated and is crying over small things.She wants to discontinue the Wellbutrin as she feels more tired with the combination of Tegretol and lamotrigine which is prescribed due to seizure disorder.Patient reported that she has been compliant with her medication and has been having some marital issues. She currently denied having any suicidal, homicidal ideations or plans), fatigue, indecisiveness, psychomotor retardation, appetite change, weight gain, irritability (Improving.) and anxiety, while symptoms do not include suicidal ideation, suicide attempt, sense of failure, weight loss, poor sleep or hypersomnia. The patient describes this as moderate in severity and improving. Symptoms are exacerbated by emotional stress, fatigue and family stressors.  Additional reasons for visit:  Recheck of Psychological conditionis described as the following: The onset of the psychological condition has been gradual and has been occurring for 12 months. Note for "Psychological condition": Partner/Marital Problems are improving per both client & spouse. Primary stressor is financial and due to spouse's ongoing unemployment.  Recheck of Anxiety disordersis described as the following: It is classified as  post traumatic stress disorder. Symptoms include palpitations, chest discomfort, racing thoughts and excessive worrying. The patient describes this as moderate in severity and improving.  Note: Start Time: 12:41 p.m. End Time: 1:35 p.m.  Diane & Nicole Kindred return for outpatient marital session to address communication and conflict resolution skills  "It's like a roller coaster. Everything is falling apart, at the house. You're sitting around stressed out." Recent disappointment when they were denied for assistance via [place] for a home improvement when they believe that they met the criteria for this program. Normalized their frustration and disappointment in the outcome. They do continue to receive financial assistance from local agencies to help pay for utilities and other needs and assured LCSW that they are getting there needs met. LCSW encouraged them to reapply for the [facility] program if their household repairs remain and offered to write a letter to advocate for the [place] to approve them. Reinforced that LCSW can not make any promises that this would help and reminded them that in many ways this would allow others to have more personal information about the emotional impact this has had on them individually and as a family. Discussed HIPAA and encouraged the two to let LCSW know their decision and assured that the letter could be written using language that we all approve. Both voiced much appreciation and understanding.  Spouse is not working and seems somewhat motivated to search for work and the challenges that he is facing as an older Engineer, materials. "Nicole Kindred has never been without a job." Their daughter was described as "She has really carried [place] and has really stepped up to help." She is working and going back to school and LCSW commended them on raising a daughter with a good work ethic who is goal  directed and raising her teen  son to be the same.  Regarding financial stressors, Dianea stated: "This is the worse it's been" with the couple reflecting on the financial hardship faced over past few months after he was fired from most recent company. In the past their was some source of income like Workman's Comp and unemployment so the couple were able to meet obligations and needs. His appeals for unemployment have been denied. Explored with client and husband the option of speaking to Legal Aide regarding his eligibiity for unemployment or not and Nicole Kindred would like to pursue this option. He has this information at home he assured LCSW.  "I think were doing better and more respectful of each other" per husband, Nicole Kindred commented and Mayre agreed. She is aware of unresolved issues and feelings that she has around spouse not working and the financial crisis that the two have found themselves in and voiced as her personal goal: "Trying to work on getting past the part that I blame Nicole Kindred. I want to get past that part and being so angry with him at times because of the situation."  The two admitted that they finally completed the Gratitude List and will bring that to next session. Situation with a broken oven and events around this which eventually led to client's brother and sister in law giving them an old stove since they bought a new one and the repair man gave them incorrect information and basically got their oven for $20 as they did not find out that it could have been fixed until after the repair man removed it from the home. Both admitted "We've made some bad choices here lately but we're so worn down." Explored how they found a way to let this go and commented on how calm and less stressed both of them looked and presented during session. The laughed about this and acknowledged "Sometimes you're gonna get it. You're gonna lose." Commended them on healthier outlook and attitude and the noticeable improvement in their moods and  interactions.  Overall both client and spouse feel things are improving and Laquesha is having an improvement in her symptoms as noted when she saw Dr. Jimmye Norman at last visit and today she confirmed this with LCSW.  No new concerns voiced. Both remain satisfied with therapeutic experience/sessions and motivated for ongoing treatment to continue venting and letting go of intense emotions and adding to their coping skills and working through anger and other emotions.  Supportive psychotherapy with insight using emphasis on CBT & Inter-personal & Intra-Personal issues and influences on communication and expression of needs.    Medication History(Shantara Goosby N Cardale Dorer; 06/05/2014 12:57 PM) Viibryd (40MG Tablet, 1 Oral daily, Taken starting 05/08/2014) Active. Lithium Carbonate (300MG Capsule, 1 (one) Capsule Oral two times daily, Taken starting 05/08/2014) Active. Baclofen (10MG Tablet, Oral two times daily, as needed) Active. LaMICtal (100MG Tablet, Oral two times daily) Active. Ketoprofen (75MG Capsule, Oral as needed) Active. Myrbetriq (25MG Tablet ER 24HR, Oral daily) Active. ChlorproMAZINE HCl (25MG Tablet, Oral as needed) Active. TEGretol-XR (400MG Tablet ER 12HR, Oral two times daily) Active. Vytorin (10-40MG Tablet, Oral daily) Active. Furosemide (40MG Tablet, Oral daily) Active. Klor-Con M10 (10MEQ Tablet ER, Oral daily) Active. MetFORMIN HCl (500MG Tablet, Oral 5 times daily) Active. Metoprolol Tartrate (25MG Tablet, 1 1/2 Oral daily) Active. Botox (100UNIT For Solution, Injection every 3 months) Active.    Review of Systems(Derwood Becraft N Rillie Riffel; 06/05/2014 12:57 PM) Psychiatric:Present- Depression (Improved as noted above.) and Frequent crying (Patient relates  this has decreased since the addition of lithium.). Not Present- Anxiety, Delusions, Insomnia, Suicidal Ideation and Suicidal Planning.    Assessment & Plan(Gibson Telleria N Lachele Lievanos; 06/05/2014 1:09 PM) MDD (major depressive disorder),  recurrent episode, moderate (296.32  F33.1) Story: She reports improvement on the combination of Viibryd and Lithium. We will continue her on these medications without any changes. She'll follow up in 1 month Current Plans l Short Term Goal: Patient will Develop Appropriate Coping Skills  l Interventions: 1. Reinforce positive, reality-based cognitive messages that enhance self-confidence and increase adaptive action. 2. Reinforce importance of social activities and verbalization of feelings, needs and desires. 3. Psychoeducation on causes of depressive symptoms and role of internalizing feelings, especially negative feelings associated with stressful life events. 4. Offered calm, nurturing and non-judgmental environment to begin building trust and rapport with client.   GAD (generalized anxiety disorder) (300.02  F41.1) Story: Continue viibryd. Current Plans l Short Term Goal: Patient will be Able to Communicate Needs or Concerns  l Interventions: 1. Explore cognitive messages that mediate anxiety response and retrain in adaptive cognitions. 2. Reinforce insights into past emotional issues and present anxiety. (i.e., trauma and trust issues, fears). 3. Help client develop reality-based cognitive messages that will increase self-confidence in coping with rational and irrational fears. 4. Offered information on stress management strategies. 5. Validate concerns expressed and encouraged ongoing expression of feelings.   Marital/partner relational problem (V61.10  Z63.0) Current Plans l Short Term Goal: Client & spouse will report increased satisfaction with marital relationship and improved communication strategies.  l Interventions: 1. Assisted client & spouse to identify behaviors that focus on relationship building. 2. Probed family of origin history for each partner to see patterns of negative self talk, critical thoughts and ineffective emotion  regulation and communication styles repeating themselves in the present relationship. 3. Reframed client's interpretations of spouse's behaviors as rejection to more accurately reflect what may be spouse's own internal conflicts and emotional limitations and overall differences in how he thinks and interprets and reacts to situations as well as how he handles his own emotions.  l Level of Participation: Interactive  l Patient Strength: Motivated for Treatment  l Patient Strength: Managing Daily Responsibilities  l Patient Strength: Verbal  l Patient Strength: Stable Housing  l Patient Strength: Aware of Need for Medications  l FAMILY PSYCHIATRIC THERAPY WITH PATIENT [phone]) l Follow up in 2 weeks or as needed    Signed electronically by Marian Sorrow Jalana Moore (06/07/2014 9:41 AM)

## 2014-10-10 ENCOUNTER — Ambulatory Visit: Payer: Medicaid Other | Admitting: Licensed Clinical Social Worker

## 2014-10-14 DIAGNOSIS — N3941 Urge incontinence: Secondary | ICD-10-CM

## 2014-10-14 DIAGNOSIS — R1013 Epigastric pain: Secondary | ICD-10-CM | POA: Insufficient documentation

## 2014-10-14 DIAGNOSIS — I509 Heart failure, unspecified: Secondary | ICD-10-CM | POA: Insufficient documentation

## 2014-10-14 DIAGNOSIS — F341 Dysthymic disorder: Secondary | ICD-10-CM | POA: Insufficient documentation

## 2014-10-14 DIAGNOSIS — F329 Major depressive disorder, single episode, unspecified: Secondary | ICD-10-CM | POA: Insufficient documentation

## 2014-10-14 DIAGNOSIS — G4733 Obstructive sleep apnea (adult) (pediatric): Secondary | ICD-10-CM | POA: Insufficient documentation

## 2014-10-14 DIAGNOSIS — R002 Palpitations: Secondary | ICD-10-CM | POA: Insufficient documentation

## 2014-10-14 DIAGNOSIS — K921 Melena: Secondary | ICD-10-CM | POA: Insufficient documentation

## 2014-10-14 DIAGNOSIS — R609 Edema, unspecified: Secondary | ICD-10-CM | POA: Insufficient documentation

## 2014-10-14 DIAGNOSIS — G43909 Migraine, unspecified, not intractable, without status migrainosus: Secondary | ICD-10-CM | POA: Insufficient documentation

## 2014-10-14 DIAGNOSIS — F32A Depression, unspecified: Secondary | ICD-10-CM | POA: Insufficient documentation

## 2014-10-14 DIAGNOSIS — G43109 Migraine with aura, not intractable, without status migrainosus: Secondary | ICD-10-CM

## 2014-10-14 DIAGNOSIS — E118 Type 2 diabetes mellitus with unspecified complications: Secondary | ICD-10-CM

## 2014-10-14 DIAGNOSIS — E1159 Type 2 diabetes mellitus with other circulatory complications: Secondary | ICD-10-CM | POA: Insufficient documentation

## 2014-10-14 DIAGNOSIS — G40909 Epilepsy, unspecified, not intractable, without status epilepticus: Secondary | ICD-10-CM

## 2014-10-14 DIAGNOSIS — L659 Nonscarring hair loss, unspecified: Secondary | ICD-10-CM | POA: Insufficient documentation

## 2014-10-14 DIAGNOSIS — I10 Essential (primary) hypertension: Secondary | ICD-10-CM | POA: Insufficient documentation

## 2014-10-14 DIAGNOSIS — E7800 Pure hypercholesterolemia, unspecified: Secondary | ICD-10-CM | POA: Insufficient documentation

## 2014-10-14 DIAGNOSIS — E785 Hyperlipidemia, unspecified: Secondary | ICD-10-CM | POA: Insufficient documentation

## 2014-10-14 DIAGNOSIS — R569 Unspecified convulsions: Secondary | ICD-10-CM | POA: Insufficient documentation

## 2014-10-14 DIAGNOSIS — E119 Type 2 diabetes mellitus without complications: Secondary | ICD-10-CM | POA: Insufficient documentation

## 2014-10-16 ENCOUNTER — Ambulatory Visit (INDEPENDENT_AMBULATORY_CARE_PROVIDER_SITE_OTHER): Payer: Medicaid Other | Admitting: Licensed Clinical Social Worker

## 2014-10-16 DIAGNOSIS — F331 Major depressive disorder, recurrent, moderate: Secondary | ICD-10-CM

## 2014-10-16 DIAGNOSIS — F411 Generalized anxiety disorder: Secondary | ICD-10-CM | POA: Diagnosis not present
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
## 2014-10-17 ENCOUNTER — Other Ambulatory Visit
Admission: RE | Admit: 2014-10-17 | Discharge: 2014-10-17 | Disposition: A | Discharge status: Home / Self Care | Payer: Medicare Other | Source: Ambulatory Visit | Attending: Psychiatry | Admitting: Psychiatry

## 2014-10-17 ENCOUNTER — Encounter: Payer: Self-pay | Admitting: Psychiatry

## 2014-10-17 ENCOUNTER — Ambulatory Visit (INDEPENDENT_AMBULATORY_CARE_PROVIDER_SITE_OTHER): Payer: Medicaid Other | Admitting: Psychiatry

## 2014-10-17 VITALS — BP 177/110 | HR 85 | Ht 63.0 in

## 2014-10-17 DIAGNOSIS — F331 Major depressive disorder, recurrent, moderate: Secondary | ICD-10-CM | POA: Diagnosis not present

## 2014-10-17 DIAGNOSIS — F329 Major depressive disorder, single episode, unspecified: Secondary | ICD-10-CM | POA: Insufficient documentation

## 2014-10-17 LAB — TSH: TSH: 3.633 u[IU]/mL (ref 0.350–4.500)

## 2014-10-17 LAB — BASIC METABOLIC PANEL
Anion gap: 4 — ABNORMAL LOW (ref 5–15)
BUN: 22 mg/dL — AB (ref 6–20)
CO2: 28 mmol/L (ref 22–32)
Calcium: 10.2 mg/dL (ref 8.9–10.3)
Chloride: 106 mmol/L (ref 101–111)
Creatinine, Ser: 1 mg/dL (ref 0.44–1.00)
GFR calc Af Amer: >60 mL/min (ref >60)
GLUCOSE: 153 mg/dL — AB (ref 65–99)
POTASSIUM: 4.6 mmol/L (ref 3.5–5.1)
Sodium: 138 mmol/L (ref 135–145)

## 2014-10-17 LAB — LITHIUM LEVEL: LITHIUM LVL: 0.3 mmol/L — AB (ref 0.60–1.20)

## 2014-10-17 MED ORDER — LITHIUM CARBONATE 300 MG PO TABS: 300.0000 mg | ORAL_TABLET | Freq: Two times a day (BID) | ORAL | Status: DC

## 2014-10-17 MED ORDER — VILAZODONE HCL 20 MG PO TABS: ORAL_TABLET | ORAL | Status: DC

## 2014-10-17 NOTE — Telephone Encounter (Signed)
LCSW was unable to return client's call.  Abigail Figueroa with scheduled outpatient appointment with LCSW following the call to clinic.

## 2014-10-17 NOTE — Progress Notes (Signed)
BH MD/PA/NP OP Progress Note  10/17/2014 9:03 AM Abigail Figueroa  MRN:  [id number]  Subjective:  Patient returns for follow-up of her major depressive disorder, recurrent moderate. She states that she did not come for follow-up for the past several months due to financial reasons such lack of money for gas. She relates she has continued on the lithium but to stretch it out she has only been taking it once a day at night. She has not been on her viibryd for approximately a month.  She presents with her daughter. She states that she has been experiencing depression. She states that she did have some suicidal ideation without plan or intent that occurred about one month ago. She states that she has not been very active. She states her past activity would be involved with church but they were in the process of exploring to find another church and thus this has not been an activity for her. She states she continues to take care of HER-2 dogs and find some enjoyment in that. She states that her sleep is poor and she sleeps maybe 4 hours a night. However daughter indicated the patient is sleeping during the day. We reviewed sleep hygiene to try to address this. Patient also indicates that there is some marital stress. She is seeing a therapist in this clinic to address this issue.  He lives in the home with her husband, daughter and grandchild. Chief Complaint: depression Visit Diagnosis:     ICD-9-CM ICD-10-CM   1. Major depressive disorder, recurrent episode, moderate 296.32 F33.1 Lithium level     Basic metabolic panel     TSH    Past Medical History:  Past Medical History  Diagnosis Date  . Hypertension   . Edema   . Heart palpitations June 2013    Event monitor showing sinus tachycardia, PACs with couplets and triplets.  . Obesity   . Diabetes   . Hypercholesterolemia   . Seizure disorder   . Migraine   . OSA (obstructive sleep apnea)     has been on continuous positive airway pressure   . Hx of echocardiogram 11/18/2009    showed normal left ventricular function, mild left ventricular hypertrophy, and no significant valve abnormalities.  . History of stress test 11/25/2009    Normal myocardial perfusion imaging    Past Surgical History  Procedure Laterality Date  . Cholecystectomy  1980s  . Appendectomy  1980s  . Tubal ligation  1982  . Abdominal hysterectomy  1990    without BSO  . Laminectomy  1995  . Back surgery  1900s 2001    x2 with "cage put in"  . Carpal tunnel release    . Knee surgery Right 2012    meniscus tear   Family History:  Family History  Problem Relation Age of Onset  . Heart attack Mother   . Seizures Mother   . Sarcoidosis Mother   . Alzheimer's disease Maternal Grandmother   . Dementia Maternal Grandmother   . Hypertension Maternal Grandfather   . Diabetes Maternal Grandfather    Social History:  History   Social History  . Marital Status: Married    Spouse Name: N/A  . Number of Children: N/A  . Years of Education: N/A   Social History Main Topics  . Smoking status: Former Smoker    Quit date: 01/03/1979  . Smokeless tobacco: Never Used  . Alcohol Use: No  . Drug Use: No  . Sexual Activity: Not on  file   Other Topics Concern  . None   Social History Narrative   She is a married mother of 2, grandmother 3. She tries to get exercise but is not doing any routine program.   She quit smoking over 25 years ago does not drink alcohol.   Additional History:   Assessment:   Musculoskeletal: Strength & Muscle Tone: within normal limits Gait & Station: normal Patient leans: N/A  Psychiatric Specialty Exam: HPI  Review of Systems  Psychiatric/Behavioral: Positive for depression. Negative for suicidal ideas, hallucinations, memory loss and substance abuse. The patient has insomnia. The patient is not nervous/anxious.     Blood pressure 177/110, pulse 85, height _0  (1.6 m).There is no weight on file to calculate BMI.   General Appearance: Well Groomed  Eye Contact:  Good  Speech:  Clear and Coherent and Normal Rate  Volume:  Normal  Mood:  Depressed  Affect:  Congruent  Thought Process:  Linear  Orientation:  Full (Time, Place, and Person)  Thought Content:  Negative  Suicidal Thoughts:  No  Homicidal Thoughts:  No  Memory:  Immediate;   Good Recent;   Good Remote;   Good  Judgement:  Good  Insight:  Good  Psychomotor Activity:  Negative  Concentration:  Good  Recall:  Good  Fund of Knowledge: Good  Language: Good  Akathisia:  Negative  Handed:  Right  AIMS (if indicated):  Not done  Assets:  Communication Skills Desire for Improvement Social Support  ADL's:  Intact  Cognition: WNL  Sleep:  Poor, not using CPAP, daytime napping   Is the patient at risk to self?  No. Has the patient been a risk to self in the past 6 months?  No. Has the patient been a risk to self within the distant past?  No. Is the patient a risk to others?  No. Has the patient been a risk to others in the past 6 months?  No. Has the patient been a risk to others within the distant past?  No.  Current Medications: Current Outpatient Prescriptions  Medication Sig Dispense Refill  . baclofen (LIORESAL) 10 MG tablet Take 10 mg by mouth 2 (two) times daily as needed.    Marland Kitchen BOTOX 100 UNITS SOLR injection     . carbamazepine (TEGRETOL XR) 400 MG 12 hr tablet Take 400 mg by mouth 2 (two) times daily.    . fluocinonide ointment (LIDEX) 4.09 % Apply 1 application topically 2 (two) times daily as needed.  1  . lamoTRIgine (LAMICTAL) 100 MG tablet Take 100 mg by mouth 2 (two) times daily.    Marland Kitchen lithium 300 MG tablet Take 1 tablet (300 mg total) by mouth 2 (two) times daily. 60 tablet 1  . loratadine (CLARITIN) 10 MG tablet Take 1 tablet by mouth daily.  4  . losartan-hydrochlorothiazide (HYZAAR) 50-12.5 MG per tablet Take 1 tablet by mouth daily. 30 tablet 6  . metFORMIN (GLUCOPHAGE) 500 MG tablet Take 2 tablets twice daily  and 1 tablet at lunch.    . metoprolol succinate (TOPROL-XL) 25 MG 24 hr tablet Take 1 tablet (69m) by mouth in morning and 1/2 tablet (12.552m by mouth in evening. 135 tablet 3  . ONE TOUCH ULTRA TEST test strip     . VIIBRYD 40 MG TABS Take 1 tablet by mouth daily.  1  . buPROPion (WELLBUTRIN SR) 200 MG 12 hr tablet Take 1 tablet by mouth daily.  1  . chlorproMAZINE (THORAZINE)  25 MG tablet Take 25 mg by mouth as needed for nausea or vomiting.     . ezetimibe-simvastatin (VYTORIN) 10-40 MG per tablet Take 1 tablet by mouth at bedtime (Patient not taking: Reported on 10/16/2014) 30 tablet 4  . furosemide (LASIX) 40 MG tablet take as needed for swelling (Patient not taking: Reported on 10/16/2014) 30 tablet 7  . ketoprofen (ORUDIS) 75 MG capsule     . ondansetron (ZOFRAN) 8 MG tablet Take 1 tablet by mouth. Every 8 hours as needed for nausea (wait to eat or drink until 40 minutes after taking pill    . potassium chloride (K-DUR) 10 MEQ tablet Take 10 mEq by mouth daily.    . Vilazodone HCl 20 MG TABS Take one half a tablet for seven days then increase to one whole tablet daily. 30 tablet 1   No current facility-administered medications for this visit.    Medical Decision Making:  Established Problem, Stable/Improving (1) and Review or order clinical lab tests (1)  Treatment Plan Summary:Medication management We will restart the patient's Viibryd at 10 mg daily for 7 days and then we will increase it to 20 mg daily. We very well may titrated up to her prior dose of 40 mg a day. However she's been off this medication for 1 month and thus we will restart her on it. We will increase her lithium back to its previous dose of 300 mg twice a day. I have Center for laboratory tests related to lithium this morning: Lithium level, TSH and basic metabolic panel. She'll follow up in 1 month. Encouraged call any questions or concerns prior to her next point. She'll continue therapy for her marital  issues.  Faith Rogue 10/17/2014, 9:03 AM

## 2014-10-23 ENCOUNTER — Encounter (HOSPITAL_BASED_OUTPATIENT_CLINIC_OR_DEPARTMENT_OTHER): Payer: Self-pay | Admitting: *Deleted

## 2014-10-24 ENCOUNTER — Other Ambulatory Visit: Payer: Self-pay | Admitting: Cardiovascular Disease

## 2014-10-24 ENCOUNTER — Encounter (HOSPITAL_BASED_OUTPATIENT_CLINIC_OR_DEPARTMENT_OTHER)
Admission: RE | Admit: 2014-10-24 | Discharge: 2014-10-24 | Disposition: A | Discharge status: Home / Self Care | Payer: Medicare Other | Source: Ambulatory Visit | Attending: Orthopedic Surgery | Admitting: Orthopedic Surgery

## 2014-10-24 ENCOUNTER — Encounter: Payer: Self-pay | Admitting: Psychiatry

## 2014-10-24 ENCOUNTER — Other Ambulatory Visit: Payer: Self-pay

## 2014-10-24 DIAGNOSIS — E119 Type 2 diabetes mellitus without complications: Secondary | ICD-10-CM | POA: Diagnosis not present

## 2014-10-24 DIAGNOSIS — E780 Pure hypercholesterolemia: Secondary | ICD-10-CM | POA: Diagnosis not present

## 2014-10-24 DIAGNOSIS — Z885 Allergy status to narcotic agent status: Secondary | ICD-10-CM | POA: Diagnosis not present

## 2014-10-24 DIAGNOSIS — M654 Radial styloid tenosynovitis [de Quervain]: Secondary | ICD-10-CM | POA: Diagnosis not present

## 2014-10-24 DIAGNOSIS — G4733 Obstructive sleep apnea (adult) (pediatric): Secondary | ICD-10-CM | POA: Diagnosis not present

## 2014-10-24 DIAGNOSIS — G43909 Migraine, unspecified, not intractable, without status migrainosus: Secondary | ICD-10-CM | POA: Diagnosis not present

## 2014-10-24 DIAGNOSIS — F419 Anxiety disorder, unspecified: Secondary | ICD-10-CM | POA: Diagnosis not present

## 2014-10-24 DIAGNOSIS — Z6841 Body Mass Index (BMI) 40.0 and over, adult: Secondary | ICD-10-CM | POA: Diagnosis not present

## 2014-10-24 DIAGNOSIS — G40909 Epilepsy, unspecified, not intractable, without status epilepticus: Secondary | ICD-10-CM | POA: Diagnosis not present

## 2014-10-24 DIAGNOSIS — R002 Palpitations: Secondary | ICD-10-CM | POA: Diagnosis not present

## 2014-10-24 DIAGNOSIS — I10 Essential (primary) hypertension: Secondary | ICD-10-CM | POA: Diagnosis not present

## 2014-10-24 DIAGNOSIS — Z87891 Personal history of nicotine dependence: Secondary | ICD-10-CM | POA: Diagnosis not present

## 2014-10-24 DIAGNOSIS — F329 Major depressive disorder, single episode, unspecified: Secondary | ICD-10-CM | POA: Diagnosis not present

## 2014-10-24 DIAGNOSIS — I509 Heart failure, unspecified: Secondary | ICD-10-CM | POA: Diagnosis not present

## 2014-10-24 LAB — BASIC METABOLIC PANEL
ANION GAP: 5 (ref 5–15)
BUN: 14 mg/dL (ref 6–20)
CALCIUM: 10.2 mg/dL (ref 8.9–10.3)
CO2: 26 mmol/L (ref 22–32)
Chloride: 109 mmol/L (ref 101–111)
Creatinine, Ser: 1.08 mg/dL — ABNORMAL HIGH (ref 0.44–1.00)
GFR calc Af Amer: >60 mL/min (ref >60)
GFR calc non Af Amer: 57 mL/min — ABNORMAL LOW (ref >60)
Glucose, Bld: 192 mg/dL — ABNORMAL HIGH (ref 65–99)
Potassium: 4.5 mmol/L (ref 3.5–5.1)
SODIUM: 140 mmol/L (ref 135–145)

## 2014-10-25 ENCOUNTER — Ambulatory Visit (HOSPITAL_BASED_OUTPATIENT_CLINIC_OR_DEPARTMENT_OTHER): Payer: Medicare Other | Admitting: Anesthesiology

## 2014-10-25 ENCOUNTER — Encounter (HOSPITAL_BASED_OUTPATIENT_CLINIC_OR_DEPARTMENT_OTHER)
Admission: RE | Disposition: A | Discharge status: Home / Self Care | Payer: Self-pay | Source: Ambulatory Visit | Attending: Orthopedic Surgery

## 2014-10-25 ENCOUNTER — Encounter (HOSPITAL_BASED_OUTPATIENT_CLINIC_OR_DEPARTMENT_OTHER): Payer: Self-pay | Admitting: Anesthesiology

## 2014-10-25 ENCOUNTER — Ambulatory Visit (HOSPITAL_BASED_OUTPATIENT_CLINIC_OR_DEPARTMENT_OTHER)
Admission: RE | Admit: 2014-10-25 | Discharge: 2014-10-25 | Disposition: A | Discharge status: Home / Self Care | Payer: Medicare Other | Source: Ambulatory Visit | Attending: Orthopedic Surgery | Admitting: Orthopedic Surgery

## 2014-10-25 DIAGNOSIS — R002 Palpitations: Secondary | ICD-10-CM | POA: Insufficient documentation

## 2014-10-25 DIAGNOSIS — F329 Major depressive disorder, single episode, unspecified: Secondary | ICD-10-CM | POA: Diagnosis not present

## 2014-10-25 DIAGNOSIS — I10 Essential (primary) hypertension: Secondary | ICD-10-CM | POA: Diagnosis not present

## 2014-10-25 DIAGNOSIS — G43909 Migraine, unspecified, not intractable, without status migrainosus: Secondary | ICD-10-CM | POA: Diagnosis not present

## 2014-10-25 DIAGNOSIS — F419 Anxiety disorder, unspecified: Secondary | ICD-10-CM | POA: Diagnosis not present

## 2014-10-25 DIAGNOSIS — M654 Radial styloid tenosynovitis [de Quervain]: Secondary | ICD-10-CM | POA: Diagnosis not present

## 2014-10-25 DIAGNOSIS — G40909 Epilepsy, unspecified, not intractable, without status epilepticus: Secondary | ICD-10-CM | POA: Diagnosis not present

## 2014-10-25 DIAGNOSIS — G4733 Obstructive sleep apnea (adult) (pediatric): Secondary | ICD-10-CM | POA: Insufficient documentation

## 2014-10-25 DIAGNOSIS — Z885 Allergy status to narcotic agent status: Secondary | ICD-10-CM | POA: Diagnosis not present

## 2014-10-25 DIAGNOSIS — Z6841 Body Mass Index (BMI) 40.0 and over, adult: Secondary | ICD-10-CM | POA: Diagnosis not present

## 2014-10-25 DIAGNOSIS — E780 Pure hypercholesterolemia: Secondary | ICD-10-CM | POA: Insufficient documentation

## 2014-10-25 DIAGNOSIS — I509 Heart failure, unspecified: Secondary | ICD-10-CM | POA: Diagnosis not present

## 2014-10-25 DIAGNOSIS — E119 Type 2 diabetes mellitus without complications: Secondary | ICD-10-CM | POA: Insufficient documentation

## 2014-10-25 DIAGNOSIS — Z87891 Personal history of nicotine dependence: Secondary | ICD-10-CM | POA: Diagnosis not present

## 2014-10-25 DIAGNOSIS — M65831 Other synovitis and tenosynovitis, right forearm: Secondary | ICD-10-CM | POA: Diagnosis not present

## 2014-10-25 HISTORY — DX: Depression, unspecified: F32.A

## 2014-10-25 HISTORY — DX: Anxiety disorder, unspecified: F41.9

## 2014-10-25 HISTORY — DX: Headache: R51

## 2014-10-25 HISTORY — DX: Major depressive disorder, single episode, unspecified: F32.9

## 2014-10-25 HISTORY — DX: Sleep apnea, unspecified: G47.30

## 2014-10-25 HISTORY — DX: Headache, unspecified: R51.9

## 2014-10-25 HISTORY — PX: DORSAL COMPARTMENT RELEASE: SHX5039

## 2014-10-25 LAB — GLUCOSE, CAPILLARY
GLUCOSE-CAPILLARY: 182 mg/dL — AB (ref 65–99)
Glucose-Capillary: 194 mg/dL — ABNORMAL HIGH (ref 65–99)

## 2014-10-25 LAB — POCT HEMOGLOBIN-HEMACUE: HEMOGLOBIN: 12.3 g/dL (ref 12.0–15.0)

## 2014-10-25 SURGERY — RELEASE, FIRST DORSAL COMPARTMENT, HAND
Anesthesia: General | Site: Wrist | Laterality: Left

## 2014-10-25 MED ORDER — KETOROLAC TROMETHAMINE 30 MG/ML IJ SOLN
INTRAMUSCULAR | Status: AC
  Filled 2014-10-25: qty 1

## 2014-10-25 MED ORDER — KETOROLAC TROMETHAMINE 30 MG/ML IJ SOLN
30.0000 mg | Freq: Once | INTRAMUSCULAR | Status: AC | PRN
  Administered 2014-10-25: 30 mg via INTRAVENOUS

## 2014-10-25 MED ORDER — HYDROCODONE-ACETAMINOPHEN 5-325 MG PO TABS
ORAL_TABLET | ORAL | Status: AC
  Filled 2014-10-25: qty 1

## 2014-10-25 MED ORDER — DEXAMETHASONE SODIUM PHOSPHATE 4 MG/ML IJ SOLN
INTRAMUSCULAR | Status: DC | PRN
  Administered 2014-10-25: 10 mg via INTRAVENOUS

## 2014-10-25 MED ORDER — PROPOFOL 10 MG/ML IV BOLUS
INTRAVENOUS | Status: DC | PRN
  Administered 2014-10-25: 160 mg via INTRAVENOUS

## 2014-10-25 MED ORDER — LACTATED RINGERS IV SOLN
INTRAVENOUS | Status: DC
  Administered 2014-10-25: 09:00:00 via INTRAVENOUS

## 2014-10-25 MED ORDER — OXYCODONE HCL 5 MG PO TABS
ORAL_TABLET | ORAL | Status: AC
  Filled 2014-10-25: qty 1

## 2014-10-25 MED ORDER — PROMETHAZINE HCL 25 MG/ML IJ SOLN: 6.2500 mg | INTRAMUSCULAR | Status: DC | PRN

## 2014-10-25 MED ORDER — FENTANYL CITRATE (PF) 100 MCG/2ML IJ SOLN
25.0000 ug | INTRAMUSCULAR | Status: DC | PRN
  Administered 2014-10-25: 25 ug via INTRAVENOUS
  Administered 2014-10-25: 50 ug via INTRAVENOUS

## 2014-10-25 MED ORDER — CEFAZOLIN SODIUM-DEXTROSE 2-3 GM-% IV SOLR
INTRAVENOUS | Status: DC | PRN
  Administered 2014-10-25: 2 g via INTRAVENOUS

## 2014-10-25 MED ORDER — HYDROCODONE-ACETAMINOPHEN 5-325 MG PO TABS: 2.0000 | ORAL_TABLET | Freq: Four times a day (QID) | ORAL | Status: DC | PRN

## 2014-10-25 MED ORDER — FENTANYL CITRATE (PF) 100 MCG/2ML IJ SOLN
INTRAMUSCULAR | Status: AC
  Filled 2014-10-25: qty 2

## 2014-10-25 MED ORDER — LIDOCAINE HCL (CARDIAC) 20 MG/ML IV SOLN
INTRAVENOUS | Status: DC | PRN
  Administered 2014-10-25: 60 mg via INTRAVENOUS

## 2014-10-25 MED ORDER — BUPIVACAINE HCL (PF) 0.25 % IJ SOLN
INTRAMUSCULAR | Status: DC | PRN
  Administered 2014-10-25: 10 mL

## 2014-10-25 MED ORDER — MIDAZOLAM HCL 2 MG/2ML IJ SOLN
1.0000 mg | INTRAMUSCULAR | Status: DC | PRN
  Administered 2014-10-25: 2 mg via INTRAVENOUS

## 2014-10-25 MED ORDER — HYDROCODONE-ACETAMINOPHEN 5-325 MG PO TABS
1.0000 | ORAL_TABLET | Freq: Once | ORAL | Status: AC
  Administered 2014-10-25: 1 via ORAL

## 2014-10-25 MED ORDER — FENTANYL CITRATE (PF) 100 MCG/2ML IJ SOLN
50.0000 ug | INTRAMUSCULAR | Status: DC | PRN
  Administered 2014-10-25: 50 ug via INTRAVENOUS

## 2014-10-25 MED ORDER — GLYCOPYRROLATE 0.2 MG/ML IJ SOLN: 0.2000 mg | Freq: Once | INTRAMUSCULAR | Status: DC | PRN

## 2014-10-25 MED ORDER — SCOPOLAMINE 1 MG/3DAYS TD PT72: 1.0000 | MEDICATED_PATCH | Freq: Once | TRANSDERMAL | Status: DC | PRN

## 2014-10-25 SURGICAL SUPPLY — 55 items
APL SKNCLS STERI-STRIP NONHPOA (GAUZE/BANDAGES/DRESSINGS) ×1
BANDAGE ELASTIC 3 VELCRO ST LF (GAUZE/BANDAGES/DRESSINGS) ×3 IMPLANT
BENZOIN TINCTURE PRP APPL 2/3 (GAUZE/BANDAGES/DRESSINGS) ×3 IMPLANT
BLADE SURG 15 STRL LF DISP TIS (BLADE) ×2 IMPLANT
BLADE SURG 15 STRL SS (BLADE) ×6
BNDG CONFORM 3 STRL LF (GAUZE/BANDAGES/DRESSINGS) ×5 IMPLANT
BRUSH SCRUB EZ PLAIN DRY (MISCELLANEOUS) ×3 IMPLANT
CLOSURE WOUND 1/2 X4 (GAUZE/BANDAGES/DRESSINGS) ×1
CORDS BIPOLAR (ELECTRODE) ×2 IMPLANT
COVER BACK TABLE 60X90IN (DRAPES) ×3 IMPLANT
COVER MAYO STAND STRL (DRAPES) ×3 IMPLANT
CUFF TOURNIQUET SINGLE 18IN (TOURNIQUET CUFF) ×2 IMPLANT
DRAPE EXTREMITY T 121X128X90 (DRAPE) ×3 IMPLANT
DRAPE SURG 17X23 STRL (DRAPES) ×3 IMPLANT
DRSG EMULSION OIL 3X3 NADH (GAUZE/BANDAGES/DRESSINGS) ×3 IMPLANT
GAUZE SPONGE 4X4 12PLY STRL (GAUZE/BANDAGES/DRESSINGS) IMPLANT
GAUZE SPONGE 4X4 16PLY XRAY LF (GAUZE/BANDAGES/DRESSINGS) IMPLANT
GAUZE XEROFORM 1X8 LF (GAUZE/BANDAGES/DRESSINGS) ×2 IMPLANT
GLOVE BIO SURGEON STRL SZ 6.5 (GLOVE) ×1 IMPLANT
GLOVE BIO SURGEONS STRL SZ 6.5 (GLOVE) ×1
GLOVE BIOGEL M STRL SZ7.5 (GLOVE) ×1 IMPLANT
GLOVE BIOGEL PI IND STRL 7.0 (GLOVE) IMPLANT
GLOVE BIOGEL PI INDICATOR 7.0 (GLOVE) ×2
GLOVE SS BIOGEL STRL SZ 8 (GLOVE) ×1 IMPLANT
GLOVE SUPERSENSE BIOGEL SZ 8 (GLOVE) ×2
GOWN STRL REUS W/ TWL LRG LVL3 (GOWN DISPOSABLE) ×1 IMPLANT
GOWN STRL REUS W/ TWL XL LVL3 (GOWN DISPOSABLE) ×1 IMPLANT
GOWN STRL REUS W/TWL LRG LVL3 (GOWN DISPOSABLE) ×3
GOWN STRL REUS W/TWL XL LVL3 (GOWN DISPOSABLE) ×6
NDL HYPO 25X1 1.5 SAFETY (NEEDLE) ×1 IMPLANT
NDL SAFETY ECLIPSE 18X1.5 (NEEDLE) IMPLANT
NEEDLE HYPO 18GX1.5 SHARP (NEEDLE)
NEEDLE HYPO 25X1 1.5 SAFETY (NEEDLE) ×3 IMPLANT
NS IRRIG 1000ML POUR BTL (IV SOLUTION) ×3 IMPLANT
PACK BASIN DAY SURGERY FS (CUSTOM PROCEDURE TRAY) ×3 IMPLANT
PAD CAST 3X4 CTTN HI CHSV (CAST SUPPLIES) ×2 IMPLANT
PADDING CAST ABS 3INX4YD NS (CAST SUPPLIES)
PADDING CAST ABS 4INX4YD NS (CAST SUPPLIES) ×2
PADDING CAST ABS COTTON 3X4 (CAST SUPPLIES) IMPLANT
PADDING CAST ABS COTTON 4X4 ST (CAST SUPPLIES) ×1 IMPLANT
PADDING CAST COTTON 3X4 STRL (CAST SUPPLIES) ×6
SPLINT FIBERGLASS 3X35 (CAST SUPPLIES) ×2 IMPLANT
SPLINT PLASTER CAST XFAST 3X15 (CAST SUPPLIES) IMPLANT
SPLINT PLASTER XTRA FASTSET 3X (CAST SUPPLIES)
STOCKINETTE 4X48 STRL (DRAPES) ×3 IMPLANT
STOCKINETTE TUBULAR COTT 4X25 (GAUZE/BANDAGES/DRESSINGS) ×2 IMPLANT
STOCKINETTE TUBULAR COTTON 2IN (GAUZE/BANDAGES/DRESSINGS) IMPLANT
STRIP CLOSURE SKIN 1/2X4 (GAUZE/BANDAGES/DRESSINGS) ×2 IMPLANT
SUT PROLENE 4 0 PS 2 18 (SUTURE) ×3 IMPLANT
SYR BULB 3OZ (MISCELLANEOUS) ×3 IMPLANT
SYR CONTROL 10ML LL (SYRINGE) ×3 IMPLANT
TOWEL OR 17X24 6PK STRL BLUE (TOWEL DISPOSABLE) ×3 IMPLANT
TOWEL OR NON WOVEN STRL DISP B (DISPOSABLE) ×3 IMPLANT
TRAY DSU PREP LF (CUSTOM PROCEDURE TRAY) ×3 IMPLANT
UNDERPAD 30X30 (UNDERPADS AND DIAPERS) ×3 IMPLANT

## 2014-10-25 NOTE — Op Note (Signed)
See dictation#[id number] Amedeo Plenty MD

## 2014-10-25 NOTE — Transfer of Care (Signed)
Immediate Anesthesia Transfer of Care Note  Patient: Abigail Figueroa  Procedure(s) Performed: Procedure(s): LEFT FIRST  DORSAL COMPARTMENT RELEASE AND RADIAL TENOSYNOVECTOMY  (Left)  Patient Location: PACU  Anesthesia Type:General  Level of Consciousness: patient cooperative  Airway & Oxygen Therapy: Patient Spontanous Breathing and Patient connected to face mask oxygen  Post-op Assessment: Report given to RN and Post -op Vital signs reviewed and stable  Post vital signs: Reviewed and stable  Last Vitals:  Filed Vitals:   10/25/14 0833  BP: 152/82  Pulse: 64  Temp: 36.7 C  Resp: 18    Complications: No apparent anesthesia complications

## 2014-10-25 NOTE — Op Note (Deleted)
Abigail Figueroa, Abigail Figueroa             ACCOUNT NO.:  [id number]  MEDICAL RECORD NO.:  [id number]  LOCATION:                               FACILITY:  [facility]  PHYSICIAN:  Satira Anis. Felicite Zeimet, M.D.DATE OF BIRTH:  04/01/1961  DATE OF PROCEDURE:  10/25/2014 DATE OF DISCHARGE:  10/25/2014                              OPERATIVE REPORT   PREOPERATIVE DIAGNOSIS:  Left chronic first dorsal compartment tenosynovitis.  POSTOPERATIVE DIAGNOSIS:  Left chronic first dorsal compartment tenosynovitis.  PROCEDURE:  Left wrist radical tenosynovectomy of the EPB tendons with associated first dorsal compartment release.  SURGEON:  Satira Anis. Amedeo Plenty, M.D.  ASSISTANT:  None.  COMPLICATION:  None.  ANESTHESIA:  General.  TOURNIQUET TIME:  Less than 30 minutes.  INDICATIONS FOR THE PROCEDURE:  Very pleasant female, 54 years of age presents with the above-mentioned diagnosis.  I have counseled her regarding risks and benefits of  surgery including risk of infection, bleeding, anesthesia, damage to normal structures, and failure of surgery to accomplish its intended goals of relieving symptoms and restoring function.  With this in mind, she desires to proceed.  All questions have been encouraged and answered.  OPERATIVE PROCEDURE:  She was given preoperative antibiotics.  Time-out was called.  Prepped and draped in usual sterile fashion after general anesthesia was induced.  Following this, final time-out was called.  Arm was elevated, tourniquet insufflated to 250 mmHg.  Transverse incision was made just proximal to the radial styloid.  Dissection was carried down to superficial radial nerve was identified, underwent a limited neurolysis and swept out of harm's way.  She was very irritable preoperatively about superficial radial nerve distribution.  This all looked normal.  I very carefully handled the nerve at all times.  Following this, I released the first dorsal compartment off the dorsal ledge  to prevent subluxation and release to a separate compartment separating the EPB from APL tendons.  I resected a portion of the septate deep in location on the floor of the first dorsal compartment and then set about performing a tenolysis, tenosynovectomy of the tendons, which were densely adherent to the soft tissue and field with inflammatory tissue (tenosynovitis).  Following this, I demonstrated full excursion, I checked the nerve, irrigated copiously, and closed the wound with interrupted Prolene.  The patient tolerated this well.  She had full excursion.  No complications and no subluxation of the tendons with range of motion including a circular movement.  She was dressed sterilely.  Standard thumb spike was Placed.  She will see [place] back in the office in 10 days for standard postop algorithm.  I have discussed the relevant issues, do's and don'ts.  It was a pleasure to see her today.     Satira Anis. Amedeo Plenty, M.D.   ______________________________ Satira Anis. Amedeo Plenty, M.D.    [facility]  D:  10/25/2014  T:  10/25/2014  Job:  [id number]

## 2014-10-25 NOTE — Anesthesia Preprocedure Evaluation (Signed)
Anesthesia Evaluation  Patient identified by MRN, date of birth, ID band Patient awake    Reviewed: Allergy & Precautions, NPO status , Patient's Chart, lab work & pertinent test results  Airway Mallampati: II  TM Distance: >3 FB Neck ROM: Full    Dental no notable dental hx.    Pulmonary sleep apnea , former smoker,  breath sounds clear to auscultation  Pulmonary exam normal       Cardiovascular hypertension, Pt. on medications +CHF Normal cardiovascular examRhythm:Regular Rate:Normal     Neuro/Psych Anxiety negative neurological ROS     GI/Hepatic negative GI ROS, Neg liver ROS,   Endo/Other  negative endocrine ROSdiabetesMorbid obesity  Renal/GU negative Renal ROS  negative genitourinary   Musculoskeletal negative musculoskeletal ROS (+)   Abdominal   Peds negative pediatric ROS (+)  Hematology negative hematology ROS (+)   Anesthesia Other Findings   Reproductive/Obstetrics negative OB ROS                             Anesthesia Physical Anesthesia Plan  ASA: III  Anesthesia Plan: General   Post-op Pain Management:    Induction: Intravenous  Airway Management Planned: LMA  Additional Equipment:   Intra-op Plan:   Post-operative Plan: Extubation in OR  Informed Consent: I have reviewed the patients History and Physical, chart, labs and discussed the procedure including the risks, benefits and alternatives for the proposed anesthesia with the patient or authorized representative who has indicated his/her understanding and acceptance.   Dental advisory given  Plan Discussed with: CRNA and Surgeon  Anesthesia Plan Comments:         Anesthesia Quick Evaluation

## 2014-10-25 NOTE — Discharge Instructions (Signed)
Keep bandage clean and dry.  Call for any problems.  No smoking.  Criteria for driving a car: you should be off your pain medicine for 7-8 hours, able to drive one handed(confident), thinking clearly and feeling able in your judgement to drive. Continue elevation as it will decrease swelling.  If instructed by MD move your fingers within the confines of the bandage/splint.  Use ice if instructed by your MD. Call immediately for any sudden loss of feeling in your hand/arm or change in functional abilities of the extremity.We recommend that you to take vitamin C 1000 mg a day to promote healing. We also recommend that if you require  pain medicine that you take a stool softener to prevent constipation as most pain medicines will have constipation side effects. We recommend either Peri-Colace or Senokot and recommend that you also consider adding MiraLAX to prevent the constipation affects from pain medicine if you are required to use them. These medicines are over the counter and maybe purchased at a local pharmacy. A cup of yogurt and a probiotic can also be helpful during the recovery process as the medicines can disrupt your intestinal environment.   Post Anesthesia Home Care Instructions  Activity: Get plenty of rest for the remainder of the day. A responsible adult should stay with you for 24 hours following the procedure.  For the next 24 hours, DO NOT: -Drive a car -Paediatric nurse -Drink alcoholic beverages -Take any medication unless instructed by your physician -Make any legal decisions or sign important papers.  Meals: Start with liquid foods such as gelatin or soup. Progress to regular foods as tolerated. Avoid greasy, spicy, heavy foods. If nausea and/or vomiting occur, drink only clear liquids until the nausea and/or vomiting subsides. Call your physician if vomiting continues.  Special Instructions/Symptoms: Your throat may feel dry or sore from the anesthesia or the breathing tube  placed in your throat during surgery. If this causes discomfort, gargle with warm salt water. The discomfort should disappear within 24 hours.  If you had a scopolamine patch placed behind your ear for the management of post- operative nausea and/or vomiting:  1. The medication in the patch is effective for 72 hours, after which it should be removed.  Wrap patch in a tissue and discard in the trash. Wash hands thoroughly with soap and water. 2. You may remove the patch earlier than 72 hours if you experience unpleasant side effects which may include dry mouth, dizziness or visual disturbances. 3. Avoid touching the patch. Wash your hands with soap and water after contact with the patch.

## 2014-10-25 NOTE — H&P (Signed)
Abigail Figueroa is an 54 y.o. female.   Chief Complaint: bilateral de Quervain's tenosynovitis HPI: patient presents for de Quervain's release. Her left side is actually the most symptomatic side at this time and she requests left sided surgery is posterior right-sided surgery. I discussed these issues with her at length and the findings.  She was originally scheduled for a right-sided release however given her symptoms on the left we have opted for a left surgical release today. The patient is awake alert and oriented and desires to proceed with left de Quervain's release  Past Medical History  Diagnosis Date  . Edema   . Heart palpitations June 2013    Event monitor showing sinus tachycardia, PACs with couplets and triplets.  . Obesity   . Diabetes   . Hypercholesterolemia   . Seizure disorder   . Migraine   . OSA (obstructive sleep apnea)     has been on continuous positive airway pressure  . Hx of echocardiogram 11/18/2009    showed normal left ventricular function, mild left ventricular hypertrophy, and no significant valve abnormalities.  . History of stress test 11/25/2009    Normal myocardial perfusion imaging  . Hypertension   . Dysrhythmia     palpitations  . Sleep apnea     CPAP  . Diabetes mellitus without complication   . Depression   . Anxiety   . Headache     migraines, gets botox injections    Past Surgical History  Procedure Laterality Date  . Cholecystectomy  1980s  . Appendectomy  1980s  . Abdominal hysterectomy  1990    without BSO  . Back surgery  1900s 2001    x2 with "cage put in"  . Knee surgery Right 2012    meniscus tear  . Abdominal hysterectomy  1990  . Cholecystectomy  1980  . Tubal ligation  1982  . Back surgery  2001  . Knee surgery  2012  . Laminectomy  1995  . Carpal tunnel release Bilateral   . Bilateral salpingoophorectomy  1990    Family History  Problem Relation Age of Onset  . Sarcoidosis Mother   . Alzheimer's disease  Maternal Grandmother   . Hypertension Maternal Grandfather   . Seizures Mother   . Heart attack Mother   . Dementia Maternal Grandmother   . Diabetes Maternal Grandfather    Social History:  reports that she quit smoking about 35 years ago. She does not have any smokeless tobacco history on file. She reports that she does not drink alcohol or use illicit drugs.  Allergies:  Allergies  Allergen Reactions  . Morphine And Related Hives    Nausea and vomiting   . Oxycontin [Oxycodone Hcl] Swelling, Hives and Itching  . Oxycontin [Oxycodone]     Medications Prior to Admission  Medication Sig Dispense Refill  . baclofen (LIORESAL) 10 MG tablet Take 10 mg by mouth 3 (three) times daily.    . carbamazepine (TEGRETOL XR) 400 MG 12 hr tablet Take 400 mg by mouth 2 (two) times daily.    . chlorproMAZINE (THORAZINE) 25 MG tablet Take 25 mg by mouth 3 (three) times daily.    Marland Kitchen ezetimibe-simvastatin (VYTORIN) 10-40 MG per tablet Take 1 tablet by mouth at bedtime.    . fluticasone (FLONASE) 50 MCG/ACT nasal spray Place 2 sprays into the nose daily.    Marland Kitchen lamoTRIgine (LAMICTAL) 100 MG tablet Take 100 mg by mouth 2 (two) times daily.    Marland Kitchen LITHIUM  CARBONATE PO Take 300 mg by mouth daily.    Marland Kitchen loratadine (CLARITIN) 10 MG tablet Take 10 mg by mouth daily.    Marland Kitchen losartan-hydrochlorothiazide (HYZAAR) 50-12.5 MG per tablet Take 1 tablet by mouth daily.    Marland Kitchen METFORMIN HCL PO Take 500 mg by mouth. 2 tablets morning and evening with 1 tablet at lunch    . metoprolol tartrate (LOPRESSOR) 25 MG tablet Take 12.5 mg by mouth 2 (two) times daily.    . OnabotulinumtoxinA (BOTOX IJ) Inject as directed.    . Vilazodone HCl (VIIBRYD) 40 MG TABS Take by mouth daily.    . baclofen (LIORESAL) 10 MG tablet Take 10 mg by mouth 2 (two) times daily as needed.    Marland Kitchen BOTOX 100 UNITS SOLR injection     . buPROPion (WELLBUTRIN SR) 200 MG 12 hr tablet Take 1 tablet by mouth daily.  1  . carbamazepine (TEGRETOL XR) 400 MG 12 hr  tablet Take 400 mg by mouth 2 (two) times daily.    . chlorproMAZINE (THORAZINE) 25 MG tablet Take 25 mg by mouth as needed for nausea or vomiting.     Marland Kitchen etodolac (LODINE) 200 MG capsule TAKE ONE CAPSULE BY MOUTH 4 TIMES A DAY AS NEEDED WITH FOOD  0  . ezetimibe-simvastatin (VYTORIN) 10-40 MG per tablet Take 1 tablet by mouth at bedtime (Patient not taking: Reported on 10/16/2014) 30 tablet 4  . fluocinonide ointment (LIDEX) 6.50 % Apply 1 application topically 2 (two) times daily as needed.  1  . furosemide (LASIX) 40 MG tablet take as needed for swelling (Patient not taking: Reported on 10/16/2014) 30 tablet 7  . ketoprofen (ORUDIS) 75 MG capsule     . lamoTRIgine (LAMICTAL) 100 MG tablet Take 100 mg by mouth 2 (two) times daily.    Marland Kitchen lithium 300 MG tablet Take 1 tablet (300 mg total) by mouth 2 (two) times daily. 60 tablet 1  . lithium carbonate 300 MG capsule Take 300 mg by mouth 2 (two) times daily.  1  . loratadine (CLARITIN) 10 MG tablet Take 1 tablet by mouth daily.  4  . losartan-hydrochlorothiazide (HYZAAR) 50-12.5 MG per tablet Take 1 tablet by mouth daily. 30 tablet 6  . losartan-hydrochlorothiazide (HYZAAR) 50-12.5 MG per tablet Take by mouth.    . metFORMIN (GLUCOPHAGE) 500 MG tablet Take 2 tablets twice daily and 1 tablet at lunch.    . metoprolol succinate (TOPROL-XL) 25 MG 24 hr tablet TAKE 1 TABLET BY MOUTH IN MORNING AND 1/2 TABLET BY MOUTH IN EVENING. 60 tablet 2  . ondansetron (ZOFRAN) 8 MG tablet Take 1 tablet by mouth. Every 8 hours as needed for nausea (wait to eat or drink until 40 minutes after taking pill    . ondansetron (ZOFRAN) 8 MG tablet Take by mouth every 8 (eight) hours as needed for nausea or vomiting.    . ONE TOUCH ULTRA TEST test strip     . potassium chloride (K-DUR) 10 MEQ tablet Take 10 mEq by mouth daily.    . traMADol (ULTRAM) 50 MG tablet TAKE 1 TABLET BY MOUTH EVERY 4 TO 6 HOURS AS NEEDED FOR PAIN  0  . traMADol-acetaminophen (ULTRACET) 37.5-325 MG per  tablet Take by mouth. for pain  0  . VIIBRYD 40 MG TABS Take 1 tablet by mouth daily.  1  . Vilazodone HCl 20 MG TABS Take one half a tablet for seven days then increase to one whole tablet daily. 30 tablet 1  Results for orders placed or performed during the hospital encounter of 10/25/14 (from the past 48 hour(s))  Basic metabolic panel     Status: Abnormal   Collection Time: 10/24/14  9:00 AM  Result Value Ref Range   Sodium 140 135 - 145 mmol/L   Potassium 4.5 3.5 - 5.1 mmol/L   Chloride 109 101 - 111 mmol/L   CO2 26 22 - 32 mmol/L   Glucose, Bld 192 (H) 65 - 99 mg/dL   BUN 14 6 - 20 mg/dL   Creatinine, Ser 1.08 (H) 0.44 - 1.00 mg/dL   Calcium 10.2 8.9 - 10.3 mg/dL   GFR calc non Af Amer 57 (L) >60 mL/min   GFR calc Af Amer >60 >60 mL/min    Comment: (NOTE) The eGFR has been calculated using the CKD EPI equation. This calculation has not been validated in all clinical situations. eGFR's persistently <60 mL/min signify possible Chronic Kidney Disease.    Anion gap 5 5 - 15   No results found.  Review of Systems  Eyes: Negative.   Cardiovascular: Negative.   Gastrointestinal: Negative.   Genitourinary: Negative.   Neurological: Negative.   Endo/Heme/Allergies: Negative.     Height 5' 3"  (1.6 m), weight 102.513 kg (226 lb). Physical Exam  Left wrist pain with tenosynovitis. The patient has a positive Finkelstein's test an aggressive inflammatory changes. She has no signs of infection or dystrophy. The patient is alert and oriented in no acute distress. The patient complains of pain in the affected upper extremity.  The patient is noted to have a normal HEENT exam. Lung fields show equal chest expansion and no shortness of breath. Abdomen exam is nontender without distention. Lower extremity examination does not show any fracture dislocation or blood clot symptoms. Pelvis is stable and the neck and back are stable and nontender. Assessment/Plan We will plan for left  de Quervain's release with radical tenosynovectomy We are planning surgery for your upper extremity. The risk and benefits of surgery to include risk of bleeding, infection, anesthesia,  damage to normal structures and failure of the surgery to accomplish its intended goals of relieving symptoms and restoring function have been discussed in detail. With this in mind we plan to proceed. I have specifically discussed with the patient the pre-and postoperative regime and the dos and don'ts and risk and benefits in great detail. Risk and benefits of surgery also include risk of dystrophy(CRPS), chronic nerve pain, failure of the healing process to go onto completion and other inherent risks of surgery The relavent the pathophysiology of the disease/injury process, as well as the alternatives for treatment and postoperative course of action has been discussed in great detail with the patient who desires to proceed.  We will do everything in our power to help you (the patient) restore function to the upper extremity. It is a pleasure to see this patient today.  Paulene Floor 10/25/2014, 8:19 AM

## 2014-10-25 NOTE — Anesthesia Postprocedure Evaluation (Signed)
  Anesthesia Post-op Note  Patient: Abigail Figueroa  Procedure(s) Performed: Procedure(s) (LRB): LEFT FIRST  DORSAL COMPARTMENT RELEASE AND RADIAL TENOSYNOVECTOMY  (Left)  Patient Location: PACU  Anesthesia Type: General  Level of Consciousness: awake and alert   Airway and Oxygen Therapy: Patient Spontanous Breathing  Post-op Pain: mild  Post-op Assessment: Post-op Vital signs reviewed, Patient's Cardiovascular Status Stable, Respiratory Function Stable, Patent Airway and No signs of Nausea or vomiting  Last Vitals:  Filed Vitals:   10/25/14 1015  BP: 148/87  Pulse: 64  Temp:   Resp: 16    Post-op Vital Signs: stable   Complications: No apparent anesthesia complications

## 2014-10-25 NOTE — Anesthesia Procedure Notes (Signed)
Procedure Name: LMA Insertion Date/Time: 10/25/2014 8:52 AM Performed by: Lyndee Leo Pre-anesthesia Checklist: Patient identified, Emergency Drugs available, Suction available and Patient being monitored Patient Re-evaluated:Patient Re-evaluated prior to inductionOxygen Delivery Method: Circle System Utilized Preoxygenation: Pre-oxygenation with 100% oxygen Intubation Type: IV induction Ventilation: Mask ventilation without difficulty LMA: LMA inserted LMA Size: 4.0 Number of attempts: 1 Airway Equipment and Method: Bite block Placement Confirmation: positive ETCO2 Tube secured with: Tape Dental Injury: Teeth and Oropharynx as per pre-operative assessment

## 2014-10-25 NOTE — Op Note (Signed)
Abigail Figueroa, Abigail Figueroa             ACCOUNT NO.:  [id number]  MEDICAL RECORD NO.:  [id number]  LOCATION:                               FACILITY:  [facility]  PHYSICIAN:  Satira Anis. Varnell Donate, M.D.DATE OF BIRTH:  12/29/1960  DATE OF PROCEDURE:  10/25/2014 DATE OF DISCHARGE:  10/25/2014                              OPERATIVE REPORT   PREOPERATIVE DIAGNOSIS:  Left chronic first dorsal compartment tenosynovitis.  POSTOPERATIVE DIAGNOSIS:  Left chronic first dorsal compartment tenosynovitis.  PROCEDURE:  Left wrist radical tenosynovectomy of the EPB tendons with associated first dorsal compartment release.  SURGEON:  Satira Anis. Amedeo Plenty, M.D.  ASSISTANT:  None.  COMPLICATION:  None.  ANESTHESIA:  General.  TOURNIQUET TIME:  Less than 30 minutes.  INDICATIONS FOR THE PROCEDURE:  Very pleasant female, 54 years of age presents with the above-mentioned diagnosis.  I have counseled her regarding risks and benefits of  surgery including risk of infection, bleeding, anesthesia, damage to normal structures, and failure of surgery to accomplish its intended goals of relieving symptoms and restoring function.  With this in mind, she desires to proceed.  All questions have been encouraged and answered.  OPERATIVE PROCEDURE:  She was given preoperative antibiotics.  Time-out was called.  Prepped and draped in usual sterile fashion after general anesthesia was induced.  Following this, final time-out was called.  Arm was elevated, tourniquet insufflated to 250 mmHg.  Transverse incision was made just proximal to the radial styloid.  Dissection was carried down to superficial radial nerve was identified, underwent a limited neurolysis and swept out of harm's way.  She was very irritable preoperatively about superficial radial nerve distribution.  This all looked normal.  I very carefully handled the nerve at all times.  Following this, I released the first dorsal compartment off the dorsal ledge  to prevent subluxation and release to a separate compartment separating the EPB from APL tendons.  I resected a portion of the septate deep in location on the floor of the first dorsal compartment and then set about performing a tenolysis, tenosynovectomy of the tendons, which were densely adherent to the soft tissue and field with inflammatory tissue (tenosynovitis).  Following this, I demonstrated full excursion, I checked the nerve, irrigated copiously, and closed the wound with interrupted Prolene.  The patient tolerated this well.  She had full excursion.  No complications and no subluxation of the tendons with range of motion including a circular movement.  She was dressed sterilely.  Standard thumb spike was Placed.  She will see [place] back in the office in 10 days for standard postop algorithm.  I have discussed the relevant issues, do's and don'ts.  It was a pleasure to see her today.     Satira Anis. Amedeo Plenty, M.D.   ______________________________ Satira Anis. Amedeo Plenty, M.D.    [facility]  D:  10/25/2014  T:  10/25/2014  Job:  [id number]

## 2014-10-28 ENCOUNTER — Encounter (HOSPITAL_BASED_OUTPATIENT_CLINIC_OR_DEPARTMENT_OTHER): Payer: Self-pay | Admitting: Orthopedic Surgery

## 2014-10-30 ENCOUNTER — Ambulatory Visit: Payer: Medicaid Other | Admitting: Licensed Clinical Social Worker

## 2014-11-05 ENCOUNTER — Ambulatory Visit: Payer: Medicaid Other | Admitting: Licensed Clinical Social Worker

## 2014-11-05 DIAGNOSIS — Z639 Problem related to primary support group, unspecified: Secondary | ICD-10-CM | POA: Insufficient documentation

## 2014-11-05 DIAGNOSIS — Z8669 Personal history of other diseases of the nervous system and sense organs: Secondary | ICD-10-CM | POA: Insufficient documentation

## 2014-11-05 DIAGNOSIS — G43909 Migraine, unspecified, not intractable, without status migrainosus: Secondary | ICD-10-CM | POA: Insufficient documentation

## 2014-11-05 DIAGNOSIS — F331 Major depressive disorder, recurrent, moderate: Secondary | ICD-10-CM | POA: Insufficient documentation

## 2014-11-05 DIAGNOSIS — Z8639 Personal history of other endocrine, nutritional and metabolic disease: Secondary | ICD-10-CM | POA: Insufficient documentation

## 2014-11-05 DIAGNOSIS — F411 Generalized anxiety disorder: Secondary | ICD-10-CM | POA: Insufficient documentation

## 2014-11-05 DIAGNOSIS — Z8679 Personal history of other diseases of the circulatory system: Secondary | ICD-10-CM | POA: Insufficient documentation

## 2014-11-05 DIAGNOSIS — F439 Reaction to severe stress, unspecified: Secondary | ICD-10-CM | POA: Insufficient documentation

## 2014-11-08 ENCOUNTER — Ambulatory Visit: Payer: Medicaid Other | Admitting: Licensed Clinical Social Worker

## 2014-11-11 ENCOUNTER — Other Ambulatory Visit: Payer: Self-pay

## 2014-11-12 ENCOUNTER — Ambulatory Visit: Payer: Self-pay | Admitting: Family Medicine

## 2014-11-14 DIAGNOSIS — M79642 Pain in left hand: Secondary | ICD-10-CM | POA: Diagnosis not present

## 2014-11-15 ENCOUNTER — Ambulatory Visit: Payer: Medicaid Other | Admitting: Licensed Clinical Social Worker

## 2014-11-19 ENCOUNTER — Ambulatory Visit: Payer: Medicaid Other | Admitting: Psychiatry

## 2014-11-20 DIAGNOSIS — G43719 Chronic migraine without aura, intractable, without status migrainosus: Secondary | ICD-10-CM | POA: Diagnosis not present

## 2014-11-21 ENCOUNTER — Ambulatory Visit: Payer: Medicaid Other | Admitting: Cardiovascular Disease

## 2014-11-21 DIAGNOSIS — M79642 Pain in left hand: Secondary | ICD-10-CM | POA: Diagnosis not present

## 2014-11-22 ENCOUNTER — Other Ambulatory Visit: Payer: Self-pay | Admitting: Family Medicine

## 2014-11-22 DIAGNOSIS — M79642 Pain in left hand: Secondary | ICD-10-CM | POA: Diagnosis not present

## 2014-11-25 ENCOUNTER — Ambulatory Visit (INDEPENDENT_AMBULATORY_CARE_PROVIDER_SITE_OTHER): Payer: Medicaid Other | Admitting: Licensed Clinical Social Worker

## 2014-11-25 DIAGNOSIS — F331 Major depressive disorder, recurrent, moderate: Secondary | ICD-10-CM | POA: Diagnosis not present

## 2014-11-25 DIAGNOSIS — F411 Generalized anxiety disorder: Secondary | ICD-10-CM
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
## 2014-11-26 ENCOUNTER — Encounter: Payer: Self-pay | Admitting: Psychiatry

## 2014-11-26 ENCOUNTER — Ambulatory Visit (INDEPENDENT_AMBULATORY_CARE_PROVIDER_SITE_OTHER): Payer: Medicaid Other | Admitting: Psychiatry

## 2014-11-26 VITALS — BP 138/85 | HR 92 | Temp 97.8°F | Ht 63.0 in | Wt 224.8 lb

## 2014-11-26 DIAGNOSIS — F331 Major depressive disorder, recurrent, moderate: Secondary | ICD-10-CM | POA: Diagnosis not present

## 2014-11-26 MED ORDER — VILAZODONE HCL 40 MG PO TABS: 40.0000 mg | ORAL_TABLET | Freq: Every day | ORAL | Status: DC

## 2014-11-26 MED ORDER — LITHIUM CARBONATE 300 MG PO TABS: 300.0000 mg | ORAL_TABLET | Freq: Two times a day (BID) | ORAL | Status: DC

## 2014-11-26 NOTE — Progress Notes (Signed)
BH MD/PA/NP OP Progress Note  11/26/2014 8:45 AM Abigail Figueroa  MRN:  [id number]  Subjective:  Patient returns for follow-up of her major depressive disorder, recurrent moderate. Patient states that overall she feels her mood is good. She feels she laughs more. She is sleeping well she states she goes to bed around 10 or 11 and then wake up at 6 in the morning to do gardening. She is not reporting any side effects or medications. However she did comment that prior to being off of her medications for several months her previous dose of the Viibryd was 40 mg and she felt like her mood was slightly better on that higher dose. I explained that because we are restarting her we did not go back up to her previous dose but that we could do that today. I discussed her lithium laboratory results with her that she obtained back in June. lithium was 0.3, it was drawn at 12 hours or more after her evening dose. TSH was within normal limits, renting was within normal limits P when was slightly high at 22.  He lives in the home with her husband, daughter and grandchild. Chief Complaint: depression Visit Diagnosis:   No diagnosis found.  Past Medical History:  Past Medical History  Diagnosis Date  . Edema   . Heart palpitations June 2013    Event monitor showing sinus tachycardia, PACs with couplets and triplets.  . Obesity   . Diabetes   . Hypercholesterolemia   . Seizure disorder   . Migraine   . OSA (obstructive sleep apnea)     has been on continuous positive airway pressure  . Hx of echocardiogram 11/18/2009    showed normal left ventricular function, mild left ventricular hypertrophy, and no significant valve abnormalities.  . History of stress test 11/25/2009    Normal myocardial perfusion imaging  . Hypertension   . Dysrhythmia     palpitations  . Sleep apnea     CPAP  . Diabetes mellitus without complication   . Depression   . Anxiety   . Headache     migraines, gets botox  injections    Past Surgical History  Procedure Laterality Date  . Cholecystectomy  1980s  . Appendectomy  1980s  . Abdominal hysterectomy  1990    without BSO  . Back surgery  1900s 2001    x2 with "cage put in"  . Knee surgery Right 2012    meniscus tear  . Abdominal hysterectomy  1990  . Cholecystectomy  1980  . Tubal ligation  1982  . Back surgery  2001  . Knee surgery  2012  . Laminectomy  1995  . Carpal tunnel release Bilateral   . Bilateral salpingoophorectomy  1990  . Dorsal compartment release Left 10/25/2014    Procedure: LEFT FIRST  DORSAL COMPARTMENT RELEASE AND RADIAL TENOSYNOVECTOMY ;  Surgeon: Roseanne Kaufman, MD;  Location: [facility];  Service: Orthopedics;  Laterality: Left;   Family History:  Family History  Problem Relation Age of Onset  . Sarcoidosis Mother   . Alzheimer's disease Maternal Grandmother   . Hypertension Maternal Grandfather   . Seizures Mother   . Heart attack Mother   . Dementia Maternal Grandmother   . Diabetes Maternal Grandfather    Social History:  History   Social History  . Marital Status: Married    Spouse Name: N/A  . Number of Children: N/A  . Years of Education: N/A   Social History  Main Topics  . Smoking status: Former Smoker    Quit date: 01/03/1979  . Smokeless tobacco: Not on file     Comment: quit in 1984  . Alcohol Use: No  . Drug Use: No  . Sexual Activity: Not on file   Other Topics Concern  . Not on file   Social History Narrative   ** Merged History Encounter **       She is a married mother of 2, grandmother 3. She tries to get exercise but is not doing any routine program.   She quit smoking over 25 years ago does not drink alcohol.   Additional History:   Assessment:   Musculoskeletal: Strength & Muscle Tone: within normal limits Gait & Station: normal Patient leans: N/A  Psychiatric Specialty Exam: HPI  Review of Systems  Psychiatric/Behavioral: Positive for depression.  Negative for suicidal ideas, hallucinations, memory loss and substance abuse. The patient has insomnia. The patient is not nervous/anxious.     There were no vitals taken for this visit.There is no weight on file to calculate BMI.  General Appearance: Well Groomed  Eye Contact:  Good  Speech:  Clear and Coherent and Normal Rate  Volume:  Normal  Mood:  Good  Affect:  Congruent  Thought Process:  Linear  Orientation:  Full (Time, Place, and Person)  Thought Content:  Negative  Suicidal Thoughts:  No  Homicidal Thoughts:  No  Memory:  Immediate;   Good Recent;   Good Remote;   Good  Judgement:  Good  Insight:  Good  Psychomotor Activity:  Negative  Concentration:  Good  Recall:  Good  Fund of Knowledge: Good  Language: Good  Akathisia:  Negative  Handed:  Right  AIMS (if indicated):  Not done  Assets:  Communication Skills Desire for Improvement Social Support  ADL's:  Intact  Cognition: WNL  Sleep:  Poor, not using CPAP, daytime napping   Is the patient at risk to self?  No. Has the patient been a risk to self in the past 6 months?  No. Has the patient been a risk to self within the distant past?  No. Is the patient a risk to others?  No. Has the patient been a risk to others in the past 6 months?  No. Has the patient been a risk to others within the distant past?  No.  Current Medications: Current Outpatient Prescriptions  Medication Sig Dispense Refill  . baclofen (LIORESAL) 10 MG tablet Take 10 mg by mouth 2 (two) times daily as needed.    Marland Kitchen BOTOX 100 UNITS SOLR injection     . buPROPion (WELLBUTRIN SR) 200 MG 12 hr tablet Take 1 tablet by mouth daily.  1  . carbamazepine (TEGRETOL XR) 400 MG 12 hr tablet Take 400 mg by mouth 2 (two) times daily.    . chlorproMAZINE (THORAZINE) 25 MG tablet Take 25 mg by mouth 3 (three) times daily.    Marland Kitchen etodolac (LODINE) 200 MG capsule TAKE ONE CAPSULE BY MOUTH 4 TIMES A DAY AS NEEDED WITH FOOD  0  . ezetimibe-simvastatin  (VYTORIN) 10-40 MG per tablet Take 1 tablet by mouth at bedtime 30 tablet 4  . fluocinonide ointment (LIDEX) [id number] % Apply 1 application topically 2 (two) times daily as needed.  1  . fluticasone (FLONASE) 50 MCG/ACT nasal spray Place 2 sprays into the nose daily.    . furosemide (LASIX) 40 MG tablet take as needed for swelling (Patient not taking: Reported on  10/16/2014) 30 tablet 7  . HYDROcodone-acetaminophen (NORCO) 5-325 MG per tablet Take 2 tablets by mouth every 6 (six) hours as needed for moderate pain. 30 tablet 0  . ketoprofen (ORUDIS) 75 MG capsule     . lamoTRIgine (LAMICTAL) 100 MG tablet Take 100 mg by mouth 2 (two) times daily.    Marland Kitchen lithium 300 MG tablet Take 1 tablet (300 mg total) by mouth 2 (two) times daily. 60 tablet 1  . loratadine (CLARITIN) 10 MG tablet Take 1 tablet by mouth daily.  4  . losartan-hydrochlorothiazide (HYZAAR) 50-12.5 MG per tablet Take 1 tablet by mouth daily. 30 tablet 6  . metFORMIN (GLUCOPHAGE) 500 MG tablet Take 2 tablets in the morning, 1 tablet at lunch time and 2 tablets in the evening 150 tablet 3  . metoprolol tartrate (LOPRESSOR) 25 MG tablet Take 12.5 mg by mouth 2 (two) times daily.    . mirabegron ER (MYRBETRIQ) 25 MG TB24 tablet Take by mouth.    . OnabotulinumtoxinA (BOTOX IJ) Inject as directed.    . ondansetron (ZOFRAN) 8 MG tablet Take 1 tablet by mouth. Every 8 hours as needed for nausea (wait to eat or drink until 40 minutes after taking pill    . ONE TOUCH ULTRA TEST test strip     . potassium chloride (K-DUR) 10 MEQ tablet Take 10 mEq by mouth daily.    . traMADol (ULTRAM) 50 MG tablet TAKE 1 TABLET BY MOUTH EVERY 4 TO 6 HOURS AS NEEDED FOR PAIN  0  . traMADol-acetaminophen (ULTRACET) 37.5-325 MG per tablet Take by mouth. for pain  0  . VIIBRYD 40 MG TABS Take 1 tablet by mouth daily.  1  . Vilazodone HCl (VIIBRYD) 40 MG TABS Take by mouth daily.    . Vilazodone HCl 20 MG TABS Take one half a tablet for seven days then increase to one  whole tablet daily. 30 tablet 1   No current facility-administered medications for this visit.    Medical Decision Making:  Established Problem, Stable/Improving (1) and Review or order clinical lab tests (1)  Treatment Plan Summary:Medication management We'll increase her vibrated back to her previous dose of 40 mg daily. She'll continue on the lithium 300 mg twice daily. She'll follow up in 2 months. She's been encouraged calling questions or concerns prior to her next appointment.  Faith Rogue 11/26/2014, 8:45 AM

## 2014-11-27 DIAGNOSIS — M79642 Pain in left hand: Secondary | ICD-10-CM | POA: Diagnosis not present

## 2014-12-03 DIAGNOSIS — M79642 Pain in left hand: Secondary | ICD-10-CM | POA: Diagnosis not present

## 2014-12-05 DIAGNOSIS — M79642 Pain in left hand: Secondary | ICD-10-CM | POA: Diagnosis not present

## 2014-12-10 ENCOUNTER — Ambulatory Visit: Payer: Self-pay | Admitting: Licensed Clinical Social Worker

## 2014-12-12 DIAGNOSIS — M79642 Pain in left hand: Secondary | ICD-10-CM | POA: Diagnosis not present

## 2014-12-19 DIAGNOSIS — M79642 Pain in left hand: Secondary | ICD-10-CM | POA: Diagnosis not present

## 2015-01-10 ENCOUNTER — Encounter: Payer: Self-pay | Admitting: Family Medicine

## 2015-01-10 ENCOUNTER — Ambulatory Visit (INDEPENDENT_AMBULATORY_CARE_PROVIDER_SITE_OTHER): Payer: Medicare Other | Admitting: Family Medicine

## 2015-01-10 VITALS — BP 172/94 | HR 95 | Temp 98.6°F | Resp 16 | Wt 222.6 lb

## 2015-01-10 DIAGNOSIS — E119 Type 2 diabetes mellitus without complications: Secondary | ICD-10-CM

## 2015-01-10 DIAGNOSIS — E782 Mixed hyperlipidemia: Secondary | ICD-10-CM

## 2015-01-10 DIAGNOSIS — F331 Major depressive disorder, recurrent, moderate: Secondary | ICD-10-CM | POA: Diagnosis not present

## 2015-01-10 DIAGNOSIS — I10 Essential (primary) hypertension: Secondary | ICD-10-CM | POA: Diagnosis not present

## 2015-01-10 DIAGNOSIS — J0101 Acute recurrent maxillary sinusitis: Secondary | ICD-10-CM

## 2015-01-10 MED ORDER — CETIRIZINE HCL 10 MG PO TABS: 10.0000 mg | ORAL_TABLET | Freq: Every day | ORAL | Status: DC

## 2015-01-10 MED ORDER — AMOXICILLIN 875 MG PO TABS: 875.0000 mg | ORAL_TABLET | Freq: Two times a day (BID) | ORAL | Status: DC

## 2015-01-10 NOTE — Patient Instructions (Signed)

## 2015-01-10 NOTE — Progress Notes (Signed)
Patient ID: Abigail Figueroa, female   DOB: [birth year], 54 y.o.   MRN: [id number]   Patient: Abigail Figueroa Female    DOB: Dec 05, 1960   54 y.o.   MRN: [id number] Visit Date: 01/10/2015  Today's Provider: Vernie Murders, PA   Chief Complaint  Patient presents with  . Sinusitis    X 2 weeks   Subjective:    Sinusitis This is a new problem. The current episode started 1 to 4 weeks ago. The problem is unchanged. There has been no fever. Associated symptoms include congestion, headaches, a hoarse voice, sinus pressure and sneezing. Pertinent negatives include no chills, coughing or shortness of breath. Past treatments include nothing.   Past Medical History  Diagnosis Date  . Edema   . Heart palpitations June 2013    Event monitor showing sinus tachycardia, PACs with couplets and triplets.  . Obesity   . Diabetes   . Hypercholesterolemia   . Seizure disorder   . Migraine   . OSA (obstructive sleep apnea)     has been on continuous positive airway pressure  . Hx of echocardiogram 11/18/2009    showed normal left ventricular function, mild left ventricular hypertrophy, and no significant valve abnormalities.  . History of stress test 11/25/2009    Normal myocardial perfusion imaging  . Hypertension   . Dysrhythmia     palpitations  . Sleep apnea     CPAP  . Diabetes mellitus without complication   . Depression   . Anxiety   . Headache     migraines, gets botox injections   Past Surgical History  Procedure Laterality Date  . Cholecystectomy  1980s  . Appendectomy  1980s  . Abdominal hysterectomy  1990    without BSO  . Back surgery  1900s 2001    x2 with "cage put in"  . Knee surgery Right 2012    meniscus tear  . Abdominal hysterectomy  1990  . Cholecystectomy  1980  . Tubal ligation  1982  . Back surgery  2001  . Knee surgery  2012  . Laminectomy  1995  . Carpal tunnel release Bilateral   . Bilateral salpingoophorectomy  1990  . Dorsal compartment release Left  10/25/2014    Procedure: LEFT FIRST  DORSAL COMPARTMENT RELEASE AND RADIAL TENOSYNOVECTOMY ;  Surgeon: Roseanne Kaufman, MD;  Location: [facility];  Service: Orthopedics;  Laterality: Left;  . Hand surgery    . Wrist surgery Right    Allergies  Allergen Reactions  . Morphine And Related Hives    Nausea and vomiting   . Oxycontin [Oxycodone Hcl] Swelling, Hives and Itching      Previous Medications   BACLOFEN (LIORESAL) 10 MG TABLET    Take 10 mg by mouth 2 (two) times daily as needed.   BOTOX 100 UNITS SOLR INJECTION       CARBAMAZEPINE (TEGRETOL XR) 400 MG 12 HR TABLET    Take 400 mg by mouth 2 (two) times daily.   CHLORPROMAZINE (THORAZINE) 25 MG TABLET    Take 25 mg by mouth 3 (three) times daily.   ETODOLAC (LODINE) 200 MG CAPSULE    TAKE ONE CAPSULE BY MOUTH 4 TIMES A DAY AS NEEDED WITH FOOD   EZETIMIBE-SIMVASTATIN (VYTORIN) 10-40 MG PER TABLET    Take 1 tablet by mouth at bedtime   LAMOTRIGINE (LAMICTAL) 100 MG TABLET    Take 100 mg by mouth 2 (two) times daily.   LITHIUM 300 MG TABLET  Take 1 tablet (300 mg total) by mouth 2 (two) times daily.   LORATADINE (CLARITIN) 10 MG TABLET    Take 1 tablet by mouth daily.   LOSARTAN-HYDROCHLOROTHIAZIDE (HYZAAR) 50-12.5 MG PER TABLET    Take 1 tablet by mouth daily.   METFORMIN (GLUCOPHAGE) 500 MG TABLET    Take 2 tablets in the morning, 1 tablet at lunch time and 2 tablets in the evening   METOPROLOL TARTRATE (LOPRESSOR) 25 MG TABLET    Take 12.5 mg by mouth 2 (two) times daily.   MIRABEGRON ER (MYRBETRIQ) 25 MG TB24 TABLET    Take by mouth.   ONDANSETRON (ZOFRAN) 8 MG TABLET    Take 1 tablet by mouth. Every 8 hours as needed for nausea (wait to eat or drink until 40 minutes after taking pill   ONE TOUCH ULTRA TEST TEST STRIP       POTASSIUM CHLORIDE (K-DUR) 10 MEQ TABLET    Take 10 mEq by mouth daily.   VIIBRYD 40 MG TABS    Take 1 tablet by mouth daily.   VILAZODONE HCL (VIIBRYD) 40 MG TABS    Take 1 tablet (40 mg total)  by mouth daily.  .  Review of Systems  Constitutional: Negative for fever and chills.  HENT: Positive for congestion, hoarse voice, postnasal drip, sinus pressure and sneezing.   Eyes: Positive for visual disturbance.       Some blurring of vision  Respiratory: Negative for cough and shortness of breath.   Cardiovascular: Negative.  Negative for chest pain.  Gastrointestinal: Negative.   Endocrine: Positive for polydipsia and polyuria.  Genitourinary: Positive for frequency. Negative for dysuria.       Get up 5-8 times a night to urinate and multiple times a day. History of OAB and DM with poor control.  Neurological: Positive for headaches.    Social History  Substance Use Topics  . Smoking status: Former Smoker    Types: Cigarettes    Quit date: 01/03/1979  . Smokeless tobacco: Never Used     Comment: quit in 1984  . Alcohol Use: No   Objective:   BP 172/94 mmHg  Pulse 95  Temp(Src) 98.6 F (37 C) (Oral)  Resp 16  Wt 222 lb 9.6 oz (100.971 kg)  SpO2 96%  LMP  (LMP Unknown)  Physical Exam  Constitutional: She is oriented to person, place, and time. She appears well-developed and well-nourished.  HENT:  Head: Normocephalic and atraumatic.  Left Ear: Hearing normal.  Nose: Right sinus exhibits maxillary sinus tenderness. Right sinus exhibits no frontal sinus tenderness. Left sinus exhibits maxillary sinus tenderness. Left sinus exhibits no frontal sinus tenderness.  Mouth/Throat: Mucous membranes are normal. Posterior oropharyngeal erythema present. No oropharyngeal exudate or tonsillar abscesses.  Eyes: Conjunctivae and EOM are normal.  Neck: Normal range of motion. Neck supple.  Cardiovascular: Normal rate and regular rhythm.   Pulmonary/Chest: Effort normal and breath sounds normal.  Abdominal: Soft. Bowel sounds are normal.  Neurological: She is alert and oriented to person, place, and time.  Skin: Rash noted.  Psychiatric: She exhibits a depressed mood. She  expresses no suicidal plans.      Assessment & Plan:     1. Acute recurrent maxillary sinusitis Gradual onset over the past 2 weeks. Initially started with some sneezing and rhinorrhea with post nasal drip. Does not get much help from Loratadine now. Will change to Zyrtec, add antibiotic and encouraged to use Flonase nasal spray. Increase fluid intake and  check CBC. Recheck pending reports. - amoxicillin (AMOXIL) 875 MG tablet; Take 1 tablet (875 mg total) by mouth 2 (two) times daily.  Dispense: 20 tablet; Refill: 0 - CBC with Differential/Platelet - cetirizine (ZYRTEC) 10 MG tablet; Take 1 tablet (10 mg total) by mouth daily.  Dispense: 30 tablet; Refill: 11  2. Type 2 diabetes mellitus without complication Still taking Metformin 500 mg 2 BID with extra tablet at lunch. Diet has not been the best due to financial difficulties and major depression with anxiety disorder. Some blurring of vision, polyuria and polydipsia recently. Blood work done prior to wrist surgery by Dr. Amedeo Plenty 10-25-14 showed Hgb A1C increase over the reading in April 2016 (9.4). Continues to refuse insulin as a consideration for treatment. Will recheck labs. May need SGLT or DPP4. - Hemoglobin A1c - Comprehensive metabolic panel  3. Essential hypertension Elevated BP today secondary to infection and anxiety. Still taking Metoprolol 25 mg 1 1/2 tablet BID with Hyzaar 50-12.5 mg qd. Will treat infection, encouraged to decrease salt/sodium and caffeine in diet. Recheck pending lab reports to decide about medication adjustments.  4. Mixed hyperlipidemia Tolerating Vytorin without side effects. Recheck lipid panel.  - Lipid panel  5. Depression, major, recurrent, moderate Tolerating Lithium, Lamictal and Viibryd as prescribed by psychiatris (Dr. Jimmye Norman). Will follow up with him in October 2016. Feels this regimen is controlling symptoms fairly well.

## 2015-01-11 LAB — CBC WITH DIFFERENTIAL/PLATELET
BASOS: 1 %
Basophils Absolute: 0 10*3/uL (ref 0.0–0.2)
EOS (ABSOLUTE): 0.3 10*3/uL (ref 0.0–0.4)
EOS: 5 %
Hematocrit: 36.3 % (ref 34.0–46.6)
Hemoglobin: 11.7 g/dL (ref 11.1–15.9)
IMMATURE GRANULOCYTES: 0 %
Immature Grans (Abs): 0 10*3/uL (ref 0.0–0.1)
LYMPHS: 33 %
Lymphocytes Absolute: 1.9 10*3/uL (ref 0.7–3.1)
MCH: 27.1 pg (ref 26.6–33.0)
MCHC: 32.2 g/dL (ref 31.5–35.7)
MCV: 84 fL (ref 79–97)
Monocytes Absolute: 0.2 10*3/uL (ref 0.1–0.9)
Monocytes: 4 %
NEUTROS PCT: 57 %
Neutrophils Absolute: 3.2 10*3/uL (ref 1.4–7.0)
PLATELETS: 280 10*3/uL (ref 150–379)
RBC: 4.31 x10E6/uL (ref 3.77–5.28)
RDW: 14.9 % (ref 12.3–15.4)
WBC: 5.6 10*3/uL (ref 3.4–10.8)

## 2015-01-11 LAB — COMPREHENSIVE METABOLIC PANEL
ALT: 40 IU/L — AB (ref 0–32)
AST: 60 IU/L — AB (ref 0–40)
Albumin/Globulin Ratio: 1.4 (ref 1.1–2.5)
Albumin: 4.3 g/dL (ref 3.5–5.5)
Alkaline Phosphatase: 108 IU/L (ref 39–117)
BUN/Creatinine Ratio: 10 (ref 9–23)
BUN: 10 mg/dL (ref 6–24)
Bilirubin Total: <0.2 mg/dL (ref 0.0–1.2)
CALCIUM: 10.6 mg/dL — AB (ref 8.7–10.2)
CO2: 23 mmol/L (ref 18–29)
CREATININE: 1.01 mg/dL — AB (ref 0.57–1.00)
Chloride: 103 mmol/L (ref 97–108)
GFR, EST AFRICAN AMERICAN: 73 mL/min/{1.73_m2} (ref >59)
GFR, EST NON AFRICAN AMERICAN: 63 mL/min/{1.73_m2} (ref >59)
Globulin, Total: 3.1 g/dL (ref 1.5–4.5)
Glucose: 220 mg/dL — ABNORMAL HIGH (ref 65–99)
Potassium: 3.9 mmol/L (ref 3.5–5.2)
Sodium: 142 mmol/L (ref 134–144)
Total Protein: 7.4 g/dL (ref 6.0–8.5)

## 2015-01-11 LAB — LIPID PANEL
CHOL/HDL RATIO: 3.2 ratio (ref 0.0–4.4)
Cholesterol, Total: 289 mg/dL — ABNORMAL HIGH (ref 100–199)
HDL: 91 mg/dL (ref >39)
LDL CALC: 172 mg/dL — AB (ref 0–99)
TRIGLYCERIDES: 130 mg/dL (ref 0–149)
VLDL CHOLESTEROL CAL: 26 mg/dL (ref 5–40)

## 2015-01-11 LAB — HEMOGLOBIN A1C
Est. average glucose Bld gHb Est-mCnc: 220 mg/dL
HEMOGLOBIN A1C: 9.3 % — AB (ref 4.8–5.6)

## 2015-01-14 ENCOUNTER — Telehealth: Payer: Self-pay

## 2015-01-14 NOTE — Telephone Encounter (Signed)
-----   Message from Margo Common, [place] sent at 01/13/2015  4:25 PM EDT ----- Blood sugar high and Hgb A1C still above 9.0. Normal kidney function tests. Liver tests shows slight strain with total cholesterol and LDL cholesterol high. Need to continue present Metformin and diet regimen. Should add Farxiga 5 mg qd (4 week sample) then recheck FBS daily. Schedule follow up appointment in a month to assess progress.

## 2015-01-14 NOTE — Telephone Encounter (Signed)
Patient advised as directed below. Patient verbalized understanding and agrees with treatment plan. Samples at front desk for pick up. Patient is aware. Patient scheduled for 1 month follow up appointment.

## 2015-01-17 ENCOUNTER — Telehealth: Payer: Self-pay | Admitting: Family Medicine

## 2015-01-17 MED ORDER — BENZONATATE 200 MG PO CAPS: 200.0000 mg | ORAL_CAPSULE | Freq: Two times a day (BID) | ORAL | Status: DC | PRN

## 2015-01-17 NOTE — Telephone Encounter (Signed)
Should finish all the Amoxicillin in 3-4 days. Use Tylenol or Advil for aches and pains. Will send cough medication to [facility] and recheck next week if needed.

## 2015-01-17 NOTE — Telephone Encounter (Signed)
Pt states she was in our office first of the week for sinus congestion.  Pt states she is now having body aches, cough and still having head congestion.  Pt is request another Rx to help with this.  [facility].  CB#[phone]/[id number]

## 2015-01-20 NOTE — Telephone Encounter (Signed)
Patient advised as directed below. Patient verbalized understanding. Patient states the cough medication sent to the pharmacy was over $30. Patient states she does feel a little better, but will call back if needed.

## 2015-01-20 NOTE — Telephone Encounter (Signed)
LMTCB

## 2015-01-27 ENCOUNTER — Ambulatory Visit: Payer: 59 | Admitting: Psychiatry

## 2015-01-29 ENCOUNTER — Encounter: Payer: Self-pay | Admitting: Psychiatry

## 2015-01-29 ENCOUNTER — Ambulatory Visit (INDEPENDENT_AMBULATORY_CARE_PROVIDER_SITE_OTHER): Payer: 59 | Admitting: Psychiatry

## 2015-01-29 VITALS — BP 142/98 | HR 96 | Temp 97.4°F | Ht 63.0 in | Wt 223.4 lb

## 2015-01-29 DIAGNOSIS — F331 Major depressive disorder, recurrent, moderate: Secondary | ICD-10-CM

## 2015-01-29 MED ORDER — VILAZODONE HCL 40 MG PO TABS: 40.0000 mg | ORAL_TABLET | Freq: Every day | ORAL | Status: DC

## 2015-01-29 MED ORDER — LITHIUM CARBONATE 300 MG PO TABS: 600.0000 mg | ORAL_TABLET | Freq: Every day | ORAL | Status: DC

## 2015-01-29 NOTE — Progress Notes (Signed)
[facility] MD/PA/NP OP Progress Note  01/29/2015 3:41 PM Abigail Figueroa  MRN:  [id number]  Subjective:  Patient returns for follow-up of her major depressive disorder, recurrent moderate. Patient states overall things have been pretty good. She states that her mood has generally been stable. She states she's able to enjoy her pets and her garden. She states her sleep is good. She states the only interruptions to her sleep or that one of her pets wakes her up to have to go for walks outside to go to the bathroom. She states her appetite is good. She denies any problems with the medications and feels like the lithium is been helpful for her mood and her "nerves." She did ask whether she could take both of the lithium 300 mg tablets at bedtime as opposed to one of them twice a day. Feliciana Rossetti that she was taking the morning dose she would feel somewhat drowsy from it. I told her that would be fine. I also discussed that we should recheck her lithium levels and associated labs. Chief Complaint: Cannot take both lithiums at bedtime Chief Complaint    Follow-up; Medication Refill; Stress    depression Visit Diagnosis:   No diagnosis found.  Past Medical History:  Past Medical History  Diagnosis Date  . Edema   . Heart palpitations June 2013    Event monitor showing sinus tachycardia, PACs with couplets and triplets.  . Obesity   . Diabetes   . Hypercholesterolemia   . Seizure disorder   . Migraine   . OSA (obstructive sleep apnea)     has been on continuous positive airway pressure  . Hx of echocardiogram 11/18/2009    showed normal left ventricular function, mild left ventricular hypertrophy, and no significant valve abnormalities.  . History of stress test 11/25/2009    Normal myocardial perfusion imaging  . Hypertension   . Dysrhythmia     palpitations  . Sleep apnea     CPAP  . Diabetes mellitus without complication   . Depression   . Anxiety   . Headache     migraines, gets botox  injections    Past Surgical History  Procedure Laterality Date  . Cholecystectomy  1980s  . Appendectomy  1980s  . Abdominal hysterectomy  1990    without BSO  . Back surgery  1900s 2001    x2 with "cage put in"  . Knee surgery Right 2012    meniscus tear  . Abdominal hysterectomy  1990  . Cholecystectomy  1980  . Tubal ligation  1982  . Back surgery  2001  . Knee surgery  2012  . Laminectomy  1995  . Carpal tunnel release Bilateral   . Bilateral salpingoophorectomy  1990  . Dorsal compartment release Left 10/25/2014    Procedure: LEFT FIRST  DORSAL COMPARTMENT RELEASE AND RADIAL TENOSYNOVECTOMY ;  Surgeon: Roseanne Kaufman, MD;  Location: [facility];  Service: Orthopedics;  Laterality: Left;  . Hand surgery    . Wrist surgery Right    Family History:  Family History  Problem Relation Age of Onset  . Sarcoidosis Mother   . Seizures Mother   . Heart attack Mother   . Alzheimer's disease Maternal Grandmother   . Dementia Maternal Grandmother   . Hypertension Maternal Grandfather   . Diabetes Maternal Grandfather    Social History:  Social History   Social History  . Marital Status: Married    Spouse Name: N/A  . Number of Children:  N/A  . Years of Education: N/A   Social History Main Topics  . Smoking status: Former Smoker    Types: Cigarettes    Quit date: 01/03/1979  . Smokeless tobacco: Never Used     Comment: quit in 1984  . Alcohol Use: No  . Drug Use: No  . Sexual Activity: No   Other Topics Concern  . None   Social History Narrative   ** Merged History Encounter **       She is a married mother of 2, grandmother 3. She tries to get exercise but is not doing any routine program.   She quit smoking over 25 years ago does not drink alcohol.   Additional History:   Assessment:   Musculoskeletal: Strength & Muscle Tone: within normal limits Gait & Station: normal Patient leans: N/A  Psychiatric Specialty Exam: HPI  Review of  Systems  Psychiatric/Behavioral: Negative for depression, suicidal ideas, hallucinations, memory loss and substance abuse. The patient is not nervous/anxious and does not have insomnia.     Blood pressure 142/98, pulse 96, temperature 97.4 F (36.3 C), temperature source Tympanic, height 5\' 3"  (1.6 m), weight 223 lb 6.4 oz (101.334 kg), SpO2 96 %.Body mass index is 39.58 kg/(m^2).  General Appearance: Well Groomed  Eye Contact:  Good  Speech:  Clear and Coherent and Normal Rate  Volume:  Normal  Mood:  Good  Affect:  Congruent  Thought Process:  Linear  Orientation:  Full (Time, Place, and Person)  Thought Content:  Negative  Suicidal Thoughts:  No  Homicidal Thoughts:  No  Memory:  Immediate;   Good Recent;   Good Remote;   Good  Judgement:  Good  Insight:  Good  Psychomotor Activity:  Negative  Concentration:  Good  Recall:  Good  Fund of Knowledge: Good  Language: Good  Akathisia:  Negative  Handed:  Right  AIMS (if indicated):  Not done  Assets:  Communication Skills Desire for Improvement Social Support  ADL's:  Intact  Cognition: WNL  Sleep:  Poor, not using CPAP, daytime napping   Is the patient at risk to self?  No. Has the patient been a risk to self in the past 6 months?  No. Has the patient been a risk to self within the distant past?  No. Is the patient a risk to others?  No. Has the patient been a risk to others in the past 6 months?  No. Has the patient been a risk to others within the distant past?  No.  Current Medications: Current Outpatient Prescriptions  Medication Sig Dispense Refill  . baclofen (LIORESAL) 10 MG tablet Take 10 mg by mouth 2 (two) times daily as needed.    Marland Kitchen BOTOX 100 UNITS SOLR injection     . carbamazepine (TEGRETOL XR) 400 MG 12 hr tablet Take 400 mg by mouth 2 (two) times daily.    . cetirizine (ZYRTEC) 10 MG tablet Take 1 tablet (10 mg total) by mouth daily. 30 tablet 11  . chlorproMAZINE (THORAZINE) 25 MG tablet Take 25 mg by  mouth 3 (three) times daily.    Marland Kitchen etodolac (LODINE) 200 MG capsule TAKE ONE CAPSULE BY MOUTH 4 TIMES A DAY AS NEEDED WITH FOOD  0  . ezetimibe-simvastatin (VYTORIN) 10-40 MG per tablet Take 1 tablet by mouth at bedtime 30 tablet 4  . lamoTRIgine (LAMICTAL) 100 MG tablet Take 100 mg by mouth 2 (two) times daily.    Marland Kitchen lithium 300 MG tablet Take  1 tablet (300 mg total) by mouth 2 (two) times daily. 60 tablet 2  . losartan-hydrochlorothiazide (HYZAAR) 50-12.5 MG per tablet Take 1 tablet by mouth daily. 30 tablet 6  . metFORMIN (GLUCOPHAGE) 500 MG tablet Take 2 tablets in the morning, 1 tablet at lunch time and 2 tablets in the evening 150 tablet 3  . metoprolol succinate (TOPROL-XL) 25 MG 24 hr tablet TAKE 1 TABLET BY MOUTH IN MORNING AND 1/2 TABLET BY MOUTH IN EVENING.  2  . metoprolol tartrate (LOPRESSOR) 25 MG tablet Take 12.5 mg by mouth 2 (two) times daily.    . ONE TOUCH ULTRA TEST test strip     . VIIBRYD 40 MG TABS Take 1 tablet by mouth daily.  1  . Vilazodone HCl (VIIBRYD) 40 MG TABS Take 1 tablet (40 mg total) by mouth daily. 30 tablet 2   No current facility-administered medications for this visit.    Medical Decision Making:  Established Problem, Stable/Improving (1) and Review or order clinical lab tests (1)  Treatment Plan Summary:Medication management   Age depressive disorder, recurrent, moderate-Continue Viibryd 40 mg daily. She'll continue on the lithium, change from 300 mg twice a day to 600 mg at bedtime. She'll follow up in 2 months. Also continue therapy as she relates it is helpful for her marriage. She's been encouraged calling questions or concerns prior to her next appointment.  Gave her a laboratory slip to assess lithium, thyroid and BUN/creatinine.  Of note patient does receive Lamictal and chlorpromazine and Tegretol for migraine treatment. These are prescribed by another physician.  Faith Rogue 01/29/2015, 3:41 PM

## 2015-02-10 ENCOUNTER — Other Ambulatory Visit: Payer: Self-pay | Admitting: Cardiovascular Disease

## 2015-02-10 NOTE — Telephone Encounter (Signed)
REFILL 

## 2015-02-11 ENCOUNTER — Ambulatory Visit (INDEPENDENT_AMBULATORY_CARE_PROVIDER_SITE_OTHER): Payer: 59 | Admitting: Licensed Clinical Social Worker

## 2015-02-11 ENCOUNTER — Encounter: Payer: Self-pay | Admitting: Licensed Clinical Social Worker

## 2015-02-11 DIAGNOSIS — F331 Major depressive disorder, recurrent, moderate: Secondary | ICD-10-CM

## 2015-02-11 DIAGNOSIS — F411 Generalized anxiety disorder: Secondary | ICD-10-CM | POA: Diagnosis not present

## 2015-02-11 NOTE — Progress Notes (Signed)
 THERAPIST PROGRESS NOTE  Session Time: 9:08 a.m. - 10:20 a.m.  Participation Level: Active  Behavioral Response: NeatAlertAnxious and Depressed (Improving)  Type of Therapy: Family Therapy  Treatment Goals addressed: Anxiety, Communication: Assertiveness and use of clarification and Coping  Interventions: Motivational Interviewing, Solution Focused, Strength-based, Supportive, Family Systems and Reframing  Summary: Abigail Figueroa is a 54 y.o. female who presents with improvement overall in depressive and anxious symptoms in spite of having some periodic set backs when her mood is depressed and she feels easily over-whelmed.  Client and Abigail Figueroa, spouse, return to OPT and since last session, her husband quit a job that he had after what sounds to be a threat to be terminated because he missed several days due to medical problems.  Both agreed that the last several years have been difficult financially on them and each see the other as stressed and unhappy and expressed genuine concerns for each other.  Abigail Figueroa and her husband did an effective job of explaining to one another how they are individually impacted by the financial insecurities of the last several years.  Client expressed desire to her husband for him to handle paying the bills again as this is a main stressor in her life and seems to result in disagreements between the two of them since they have different thoughts about which bills are priorities versus which are not.  "He use to do a beautiful job taking care of the bills. He had a system."  Abigail Figueroa talked about a recent stressful situation where she paid a bill then spouse checked bank account and wondered why the amount was so low. "I told him about it."  Abigail Figueroa responded "I need to see it in writing. My mind was a thousand miles away."  Through ongoing negotiating in session, the two decided on a plan that they would try that would allow them to share the bill paying responsibilities and  write these down.  Outcome of discussion for client had her admitting that her emotional needs for financial security have not been being met in the marriage and have not been met for many years with spouse in and out of jobs.  Spouse was receptive to this and explained to client "I can't be Super Man at my age. I'm a different person now. Things are different. I've been dealing with depression."  Life review facilitated us discussing the changes in the job market in this area, loss of many industries, out-sourcing of jobs along with impact of aging and health problems on sustaining employment.  Each appeared more equipped to show empathy towards one another.  The emotional and spiritual struggles faced due to financial and healthcare stressors was an additional theme of the session.  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  He spoke very passionately about this endeavor and Abigail Figueroa is supportive.  No additional needs or concerns voiced and after several months back and forth with an agency, the two were able to catch up on their mortgage and their home is not at risk of foreclosure.  They accept assistance from their daughter and both spoke of pleasure in watching their only grand-son play high school football.  Abigail Figueroa and Abigail Figueroa expressed appropriate disappointment of news that LCSW is resigning and offered well wishes.  The two would like to continue with family counseling and agreed to review the letter provided with list of other OPT providers.  Client would   like to return to see LCSW at least once more before therapist's last day with [facility].  Suicidal/Homicidal: Negativewithout intent/plan  Therapist Response:  LCSW reinforced client and spouse's progress in terms of re-framing and clarifying with one another the content of their messages to one another and being away of the role that thinking errors can play in mis-communication which  often results in conflict.  Commended the two of them on practicing skills and coping strategies/information shared during prior sessions. Assisted client with re-framing her belief that her husband "should" be providing more financial security for them and be the Super Man that she married to this being a wish and a desire and something that she misses and longs for while also learning ways to accept that time and circumstances can change people and their roles.  Supportive psychotherapy with insight was also provided to highlight progress and areas for future focus.  Validated positions and emotions held by both and discussed the concept of dialectics.  Notified verbally and in writing Abigail Figueroa and Abigail Figueroa that 03/07/15, will be LCSW's last day in this clinic.  Urged questions and feedback and assured that LCSW or office staff could assist them with referral to another OPT Provider or they could continue seeing other LCSW in the practice.  Plan: Return again in two weeks and LCSW and client/spouse will terminate services and client will discuss her preferences for OPT and referral will be discussed in more detail to assure continuity of care.  Abigail Figueroa will remain compliant with her medications and keep all medical appointments.  Call before next session if concerns or questions arise.  Diagnosis: Major Depressive Disorder   Generalized Anxiety Disorder   Rule Out PTSD    , LCSW 02/11/2015  

## 2015-02-13 ENCOUNTER — Encounter: Payer: Self-pay | Admitting: Cardiovascular Disease

## 2015-02-13 ENCOUNTER — Ambulatory Visit (INDEPENDENT_AMBULATORY_CARE_PROVIDER_SITE_OTHER): Payer: Medicare Other | Admitting: Cardiovascular Disease

## 2015-02-13 ENCOUNTER — Ambulatory Visit: Payer: Self-pay | Admitting: Family Medicine

## 2015-02-13 VITALS — BP 142/90 | HR 80 | Ht 63.0 in | Wt 225.0 lb

## 2015-02-13 DIAGNOSIS — E669 Obesity, unspecified: Secondary | ICD-10-CM

## 2015-02-13 DIAGNOSIS — R002 Palpitations: Secondary | ICD-10-CM | POA: Diagnosis not present

## 2015-02-13 DIAGNOSIS — R600 Localized edema: Secondary | ICD-10-CM

## 2015-02-13 DIAGNOSIS — E782 Mixed hyperlipidemia: Secondary | ICD-10-CM

## 2015-02-13 DIAGNOSIS — G4730 Sleep apnea, unspecified: Secondary | ICD-10-CM

## 2015-02-13 DIAGNOSIS — I10 Essential (primary) hypertension: Secondary | ICD-10-CM

## 2015-02-13 MED ORDER — FUROSEMIDE 20 MG PO TABS: 20.0000 mg | ORAL_TABLET | Freq: Every day | ORAL | Status: DC

## 2015-02-13 MED ORDER — METOPROLOL TARTRATE 50 MG PO TABS: 50.0000 mg | ORAL_TABLET | Freq: Two times a day (BID) | ORAL | Status: DC

## 2015-02-13 NOTE — Progress Notes (Signed)
Patient ID: Abigail Figueroa, female   DOB: May 13, 1960, 54 y.o.   MRN: [id number]      HPI: Abigail Figueroa is a 54 y.o. female who presents for a 11 month follow-up evaluation.  Ms. Brines has a history of hypertension, palpitations, peripheral edema, hyperlipidemia, type 2 diabetes mellitus, depression, as well as migraines. In 2007, she underwent a sleep study which was interpreted by Dr. Chancy Milroy. I do not have the diagnostic polysomnogram report but I do have the CPAP titration trial. She ultimately was titrated up to an 8 cm water pressure. She states she initially used CPAP therapy for several years but had not used this in over 2 years.  I had seen her in October 2014 and over several years several years her sleep had been very poor with frequent awakenings and loud snoring At that time, Her Epworth sleepiness scale revealed severe excessive daytime sleepiness.  Epworth Sleepiness Scale: Situation   Chance of Dozing/Sleeping (0 = never , 1 = slight chance , 2 = moderate chance , 3 = high chance )   sitting and reading 3   watching TV 3   sitting inactive in a public place 3   being a passenger in a motor vehicle for an hour or more 3   lying down in the afternoon 3   sitting and talking to someone 3   sitting quietly after lunch (no alcohol) 3   while stopped for a few minutes in traffic as the driver 0   Total Score  21   Subsequently, she started on a new machine with an S9 Elite series.  When I saw her one year ago, she was again using CPAP 1% of the time.  However, recently, she has had continued difficulty with nasal congestion and his only very intermittently using CPAP treatment area.  She states that Claritin did not work, nor did Zyrtec.   Recently, she has noticed bilateral intermittent ankle swelling.  She also admits to episodes of palpitations.  She has been taking metoprolol, tartrate 25 mg twice a day.  She has a history of hyperlipidemia and has been on Vytorin 10/40.   She is diabetic.  She recently underwent blood work by Abigail Figueroa, [place] , at [facility].  She presents for evaluation.  Past Medical History  Diagnosis Date  . Edema   . Heart palpitations June 2013    Event monitor showing sinus tachycardia, PACs with couplets and triplets.  . Obesity   . Diabetes (Abigail Figueroa)   . Hypercholesterolemia   . Seizure disorder ([place])   . Migraine   . OSA (obstructive sleep apnea)     has been on continuous positive airway pressure  . Hx of echocardiogram 11/18/2009    showed normal left ventricular function, mild left ventricular hypertrophy, and no significant valve abnormalities.  . History of stress test 11/25/2009    Normal myocardial perfusion imaging  . Hypertension   . Dysrhythmia     palpitations  . Sleep apnea     CPAP  . Diabetes mellitus without complication (Abigail Figueroa)   . Depression   . Anxiety   . Headache     migraines, gets botox injections    Past Surgical History  Procedure Laterality Date  . Cholecystectomy  1980s  . Appendectomy  1980s  . Abdominal hysterectomy  1990    without BSO  . Back surgery  1900s 2001    x2 with "cage put in"  . Figueroa surgery  Right 2012    meniscus tear  . Abdominal hysterectomy  1990  . Cholecystectomy  1980  . Tubal ligation  1982  . Back surgery  2001  . Figueroa surgery  2012  . Laminectomy  1995  . Carpal tunnel release Bilateral   . Bilateral salpingoophorectomy  1990  . Dorsal compartment release Left 10/25/2014    Procedure: LEFT FIRST  DORSAL COMPARTMENT RELEASE AND RADIAL TENOSYNOVECTOMY ;  Surgeon: Roseanne Kaufman, MD;  Location: [place];  Service: Orthopedics;  Laterality: Left;  . Hand surgery    . Wrist surgery Right     Allergies  Allergen Reactions  . Morphine And Related Hives    Nausea and vomiting   . Oxycontin [Oxycodone Hcl] Swelling, Hives and Itching    Current Outpatient Prescriptions  Medication Sig Dispense Refill  . baclofen (LIORESAL)  10 MG tablet Take 10 mg by mouth 2 (two) times daily as needed.    Marland Kitchen BOTOX 100 UNITS SOLR injection     . carbamazepine (TEGRETOL XR) 400 MG 12 hr tablet Take 400 mg by mouth 2 (two) times daily.    . chlorproMAZINE (THORAZINE) 25 MG tablet Take 25 mg by mouth 3 (three) times daily.    Marland Kitchen ezetimibe-simvastatin (VYTORIN) 10-40 MG per tablet Take 1 tablet by mouth at bedtime 30 tablet 4  . lamoTRIgine (LAMICTAL) 100 MG tablet Take 100 mg by mouth 2 (two) times daily.    Marland Kitchen lithium 300 MG tablet Take 2 tablets (600 mg total) by mouth at bedtime. 60 tablet 2  . metFORMIN (GLUCOPHAGE) 500 MG tablet Take 2 tablets in the morning, 1 tablet at lunch time and 2 tablets in the evening 150 tablet 3  . ONE TOUCH ULTRA TEST test strip     . Vilazodone HCl (VIIBRYD) 40 MG TABS Take 1 tablet (40 mg total) by mouth daily. 30 tablet 2  . furosemide (LASIX) 20 MG tablet Take 1 tablet (20 mg total) by mouth daily. 30 tablet 6  . metoprolol (LOPRESSOR) 50 MG tablet Take 1 tablet (50 mg total) by mouth 2 (two) times daily. 60 tablet 6   No current facility-administered medications for this visit.    Social history is notable that she is married has 2 children and 3 grandchildren. She does not routinely exercise. She quit smoking greater than 25 years ago. No alcohol use.  ROS General: Negative; No fevers, chills, or night sweats;  HEENT: Negative; No changes in vision or hearing, sinus congestion, difficulty swallowing Pulmonary: Negative; No cough, wheezing, shortness of breath, hemoptysis Cardiovascular: Positive for palpitations.  Positive for occasional ankle swelling. GI: Negative; No nausea, vomiting, diarrhea, or abdominal pain GU: Negative; No dysuria, hematuria, or difficulty voiding Musculoskeletal: Negative; no myalgias, joint pain, or weakness Hematologic/Oncology: Negative; no easy bruising, bleeding Endocrine: positive for diabetes Neuro: history of seizure disorder, stable.  Recently Skin:  Negative; No rashes or skin lesions Psychiatric: Negative; No behavioral problems, depression Sleep: positive for obstructive sleep apnea with resolution of snoring as long as she uses CPAP; her previous residual daytime sleepiness has significantly improved; no  bruxism, restless legs, hypnogognic hallucinations, no cataplexy Other comprehensive 14 point system review is negative.   PE BP 142/90 mmHg  Pulse 80  Ht 5' 3"  (1.6 m)  Wt 225 lb (102.059 kg)  BMI 39.87 kg/m2  LMP  (LMP Unknown)  Wt Readings from Last 3 Encounters:  02/13/15 225 lb (102.059 kg)  01/29/15 223 lb 6.4 oz (101.334  kg)  01/10/15 222 lb 9.6 oz (100.971 kg)   General: Alert, oriented, no distress.  Skin: normal turgor, no rashes HEENT: Normocephalic, atraumatic. Pupils round and reactive; sclera anicteric; Fundi mild arteriolar narrowing Nose without nasal septal hypertrophy Mouth/Parynx benign; Mallinpatti scale 4 Neck: Thick neck No JVD, no carotid briuts; normal carotid upstroke Lungs: clear to ausculatation and percussion; no wheezing or rales Chest wall: Nontender to palpation Heart: RRR, s1 s2 normal 1/6 systolic murmur. No S3 gallop.  No diastolic murmur, no rubs, thrills, or she Abdomen: Mildly obese; soft, nontender; no hepatosplenomehaly, BS+; abdominal aorta nontender and not dilated by palpation. Back: No CVA tenderness Pulses 2+ Extremities: Trace to 1+ ankle edema, no clubbing cyanosis, Homan's sign negative  Neurologic: grossly nonfocal Psychological: Normal affect and mood.  ECG (independently read by me): Sinus rhythm at 80 bpm.  Borderline LVH by voltage in aVL.  Nonspecific T changes.  November 2015 ECG (independently read by me): Normal sinus rhythm at 73 bpm.  No ectopy.  Prior ECG (independently read by me): sinus rhythm at 84 beats per minute.  Nonspecific T changes.  QTc interval 411 milliseconds.   LABS: BMP Latest Ref Rng 01/10/2015 10/24/2014 10/17/2014  Glucose 65 - 99 mg/dL  220(H) 192(H) 153(H)  BUN 6 - 24 mg/dL 10 14 22(H)  Creatinine 0.57 - 1.00 mg/dL 1.01(H) 1.08(H) 1.00  BUN/Creat Ratio 9 - 23 10 - -  Sodium 134 - 144 mmol/L 142 140 138  Potassium 3.5 - 5.2 mmol/L 3.9 4.5 4.6  Chloride 97 - 108 mmol/L 103 109 106  CO2 18 - 29 mmol/L 23 26 28   Calcium 8.7 - 10.2 mg/dL 10.6(H) 10.2 10.2   Hepatic Function Latest Ref Rng 01/10/2015 08/16/2014 08/16/2014  Total Protein 6.0 - 8.5 g/dL 7.4 7.8 -  Albumin 3.5 - 5.5 g/dL 4.3 4.0 -  AST 0 - 40 IU/L 60(H) 54(H) 54(A)  ALT 0 - 32 IU/L 40(H) 49 49(A)  Alk Phosphatase 39 - 117 IU/L 108 91 91  Total Bilirubin 0.0 - 1.2 mg/dL <0.2 0.1(L) -   CBC Latest Ref Rng 01/10/2015 10/25/2014 08/16/2014  WBC 3.4 - 10.8 x10E3/uL 5.6 - 6.5  Hemoglobin 12.0 - 15.0 g/dL - 12.3 11.3(L)  Hematocrit 34.0 - 46.6 % 36.3 - 35.6  Platelets 150-440 x10 3/mm 3 - - 259   Lab Results  Component Value Date   MCV 84 08/16/2014   MCV 85 04/19/2014   MCV 82.2 08/09/2013   Lab Results  Component Value Date   TSH 3.633 10/17/2014   Lab Results  Component Value Date   HGBA1C 9.3* 01/10/2015   Lipid Panel     Component Value Date/Time   CHOL 289* 01/10/2015 1009   CHOL 277* 08/16/2014 0843   CHOL 277* 08/16/2014   TRIG 130 01/10/2015 1009   TRIG 167* 08/16/2014 0843   HDL 91 01/10/2015 1009   HDL 77 08/16/2014 0843   HDL 77* 08/16/2014   CHOLHDL 3.2 01/10/2015 1009   CHOLHDL 4.0 01/05/2013 0943   VLDL 33 08/16/2014 0843   VLDL 19 01/05/2013 0943   LDLCALC 172* 01/10/2015 1009   LDLCALC 167* 08/16/2014 0843   LDLCALC 167 08/16/2014    RADIOLOGY: No results found.    ASSESSMENT AND PLAN: Ms. Viele is a 54 year old African-American female who has a history of hypertension, type 2 diabetes mellitus, peripheral edema, hyperlipidemia, migraine headaches, as well as obstructive sleep apnea.  Her blood pressure today is mildly elevated at 142/90.  She  has complained of recent occasional palpitations.  Her resting pulse is 80.  I  am recommending further titration of her metoprolol to 50 mg twice a day.  She also has ankle edema.  In the past she had been on diuretic therapy.  I am resuming low-dose furosemide 20 mg daily.  Recently, she has had difficulty with nasal congestion, which is her reason why she is not been using CPAP.  I have recommended she try using Allegra since apparently she wasn't sure if she benefited from Claritin or Zyrtec.  I recommended that she use nasal saline before putting her mask on and also in the morning, which may help some of her sinus congestion.  We discussed resumption of CPAP therapy, which she has done well with in the past.  We also discussed the importance of weight loss and exercise.  I reviewed the blood work which had recently been done by her primary provider.  Her glucose is significantly out of control with an elevated hemoglobin A1c.  This may also be contributing to her elevated triglycerides, and mild transaminase elevation contributing to fatty liver.  She also is on statin therapy.  If her LFTs continue to be elevated.  her statin dose will need to be decreased.  She will be following up with Abigail Figueroa next week.  I will see her in 4 months for cardiology reevaluation.   Troy Sine, MD, [facility]  02/13/2015 6:44 PM

## 2015-02-13 NOTE — Patient Instructions (Signed)
Your physician has recommended you make the following change in your medication: the lopressor has been increased to 50 mg twice a day. A new prescription has been sent to your pharmacy. A new prescription for furosemide has been started and has been sent to your pharmacy as well. Dr. Claiborne Billings recommends that you get some allegra OTC to take for your allergy symptoms.   Restart the use of your CPAP therapy.  Your physician recommends that you schedule a follow-up appointment in: 4 months with Dr. Claiborne Billings.

## 2015-02-18 ENCOUNTER — Encounter: Payer: Self-pay | Admitting: Family Medicine

## 2015-02-18 ENCOUNTER — Ambulatory Visit (INDEPENDENT_AMBULATORY_CARE_PROVIDER_SITE_OTHER): Payer: Medicare Other | Admitting: Family Medicine

## 2015-02-18 VITALS — BP 150/98 | HR 68 | Temp 98.4°F | Resp 16 | Wt 225.6 lb

## 2015-02-18 DIAGNOSIS — I10 Essential (primary) hypertension: Secondary | ICD-10-CM

## 2015-02-18 DIAGNOSIS — G4733 Obstructive sleep apnea (adult) (pediatric): Secondary | ICD-10-CM

## 2015-02-18 DIAGNOSIS — E1100 Type 2 diabetes mellitus with hyperosmolarity without nonketotic hyperglycemic-hyperosmolar coma (NKHHC): Secondary | ICD-10-CM | POA: Diagnosis not present

## 2015-02-18 NOTE — Progress Notes (Signed)
Patient ID: Abigail Figueroa, female   DOB: 16-Apr-1961, 54 y.o.   MRN: [id number]    Subjective:  Diabetes She presents for her follow-up diabetic visit. She has type 2 diabetes mellitus. Her disease course has been stable. Associated symptoms include polydipsia and polyuria. Pertinent negatives for diabetes include no foot ulcerations. (Occasional tingling in feet. Also, having nocturia 4-5 times a night with urinating once an hour during the day.) There are no hypoglycemic complications. Symptoms are stable.  Went to see her cardiologist for follow up of hypertension, palpitations and history of CHF. Increased Metoprolol to 50 mg BID and added Lasix 20 mg qd prn edema. Also, trying to encourage her to use the CPAP again at 8 cm H2O.   Prior to Admission medications   Medication Sig Start Date End Date Taking? Authorizing Provider  baclofen (LIORESAL) 10 MG tablet Take 10 mg by mouth 2 (two) times daily as needed.   Yes Historical Provider, MD  BOTOX 100 UNITS SOLR injection  11/01/12  Yes Historical Provider, MD  carbamazepine (TEGRETOL XR) 400 MG 12 hr tablet Take 400 mg by mouth 2 (two) times daily.   Yes Historical Provider, MD  chlorproMAZINE (THORAZINE) 25 MG tablet Take 25 mg by mouth 3 (three) times daily.   Yes Historical Provider, MD  ezetimibe-simvastatin (VYTORIN) 10-40 MG per tablet Take 1 tablet by mouth at bedtime 04/06/14  Yes Troy Sine, MD  furosemide (LASIX) 20 MG tablet Take 1 tablet (20 mg total) by mouth daily. 02/13/15  Yes Troy Sine, MD  lamoTRIgine (LAMICTAL) 100 MG tablet Take 100 mg by mouth 2 (two) times daily.   Yes Historical Provider, MD  lithium 300 MG tablet Take 2 tablets (600 mg total) by mouth at bedtime. 01/29/15  Yes Marjie Skiff, MD  metFORMIN (GLUCOPHAGE) 500 MG tablet Take 2 tablets in the morning, 1 tablet at lunch time and 2 tablets in the evening 11/22/14  Yes Dennis E Chrismon, PA  metoprolol (LOPRESSOR) 50 MG tablet Take 1 tablet (50 mg  total) by mouth 2 (two) times daily. 02/13/15  Yes Troy Sine, MD  ONE TOUCH ULTRA TEST test strip  01/02/13  Yes Historical Provider, MD  Vilazodone HCl (VIIBRYD) 40 MG TABS Take 1 tablet (40 mg total) by mouth daily. 01/29/15  Yes Marjie Skiff, MD    Patient Active Problem List   Diagnosis Date Noted  . History of brain disorder 11/05/2014  . H/O: obesity 11/05/2014  . Depression, major, recurrent, moderate ([place]) 11/05/2014  . Difficulty with family 11/05/2014  . Feeling stressed out 11/05/2014  . H/O: HTN (hypertension) 11/05/2014  . H/O elevated lipids 11/05/2014  . Anxiety, generalized 11/05/2014  . Migraine headache 11/05/2014  . Moderate recurrent major depression ([place]) 10/16/2014  . Generalized anxiety disorder 10/16/2014  . CHF (congestive heart failure) ([place]) 10/14/2014  . Edema 10/14/2014  . Depressive disorder 10/14/2014  . Obstructive sleep apnea 10/14/2014  . Diabetes ([place]) 10/14/2014  . Essential hypertension 10/14/2014  . Epilepsy ([place]) 10/14/2014  . Migraine 10/14/2014  . Hyperlipemia 10/14/2014  . Dysthymic disorder 10/14/2014  . Seizure ([place]) 10/14/2014  . Hypercholesteremia 10/14/2014  . Palpitation 10/14/2014  . Depression 10/14/2014  . Dyspepsia 10/14/2014  . Hematochezia 10/14/2014  . Urge incontinence 10/14/2014  . Hair thinning 10/14/2014  . Dermatitis, eczematoid 09/10/2014  . Acid indigestion 09/10/2014  . Alopecia 09/10/2014  . Blood in feces 09/10/2014  . H/O hypercholesterolemia 09/10/2014  . Arthralgia of hip 09/10/2014  .  Feeling bilious 09/10/2014  . Awareness of heartbeats 09/10/2014  . Pain in the wrist 09/10/2014  . Convulsions ([place]) 09/10/2014  . Urge incontinence 09/10/2014  . Snapping thumb syndrome 04/19/2014  . Diabetes mellitus ([place]) 03/14/2014  . De Quervain's disease (radial styloid tenosynovitis) 03/08/2014  . Mixed hyperlipidemia 01/15/2013  . Encounter for long-term (current) use of other medications - statin  01/15/2013  . Fatigue 01/15/2013  . Heart palpation 01/15/2013  . Obesity (BMI 30-39.9) 01/02/2013  . CCF (congestive cardiac failure) ([place]) 11/13/2009  . Malaise and fatigue 02/17/2009  . Sprain and strain of interphalangeal (joint) of hand 10/23/2007  . HEADACHE 06/23/2007  . DIABETES MELLITUS, TYPE II 05/08/2007  . HYPERCALCEMIA 05/08/2007  . Essential hypertension 05/08/2007  . Sleep apnea 05/08/2007  . Edema 05/08/2007  . DIARRHEA, CHRONIC 05/08/2007  . Clinical depression 10/20/2006  . Obstructive apnea 03/22/2006  . Diabetes mellitus, type 2 ([place]) 11/12/2005  . Depression, neurotic 11/12/2005  . Epilepsy ([place]) 11/12/2005  . Combined fat and carbohydrate induced hyperlipemia 11/12/2005  . Acute onset aura migraine 11/12/2005   Past Surgical History  Procedure Laterality Date  . Cholecystectomy  1980s  . Appendectomy  1980s  . Abdominal hysterectomy  1990    without BSO  . Back surgery  1900s 2001    x2 with "cage put in"  . Knee surgery Right 2012    meniscus tear  . Abdominal hysterectomy  1990  . Cholecystectomy  1980  . Tubal ligation  1982  . Back surgery  2001  . Knee surgery  2012  . Laminectomy  1995  . Carpal tunnel release Bilateral   . Bilateral salpingoophorectomy  1990  . Dorsal compartment release Left 10/25/2014    Procedure: LEFT FIRST  DORSAL COMPARTMENT RELEASE AND RADIAL TENOSYNOVECTOMY ;  Surgeon: Roseanne Kaufman, MD;  Location: [place];  Service: Orthopedics;  Laterality: Left;  . Hand surgery    . Wrist surgery Right      Past Medical History  Diagnosis Date  . Edema   . Heart palpitations June 2013    Event monitor showing sinus tachycardia, PACs with couplets and triplets.  . Obesity   . Diabetes ([place])   . Hypercholesterolemia   . Seizure disorder ([place])   . Migraine   . OSA (obstructive sleep apnea)     has been on continuous positive airway pressure  . Hx of echocardiogram 11/18/2009    showed normal left  ventricular function, mild left ventricular hypertrophy, and no significant valve abnormalities.  . History of stress test 11/25/2009    Normal myocardial perfusion imaging  . Hypertension   . Dysrhythmia     palpitations  . Sleep apnea     CPAP  . Diabetes mellitus without complication ([place])   . Depression   . Anxiety   . Headache     migraines, gets botox injections    Social History   Social History  . Marital Status: Married    Spouse Name: N/A  . Number of Children: N/A  . Years of Education: N/A   Occupational History  . Not on file.   Social History Main Topics  . Smoking status: Former Smoker    Types: Cigarettes    Quit date: 01/03/1979  . Smokeless tobacco: Never Used     Comment: quit in 1984  . Alcohol Use: No  . Drug Use: No  . Sexual Activity: No   Other Topics Concern  . Not on file  Social History Narrative   ** Merged History Encounter **       She is a married mother of 2, grandmother 3. She tries to get exercise but is not doing any routine program.   She quit smoking over 25 years ago does not drink alcohol.    Allergies  Allergen Reactions  . Morphine And Related Hives    Nausea and vomiting   . Oxycontin [Oxycodone Hcl] Swelling, Hives and Itching   Review of Systems  Constitutional: Negative.   HENT: Negative.   Eyes: Negative.   Respiratory: Negative.   Cardiovascular: Negative.   Gastrointestinal: Negative.   Genitourinary: Negative.   Musculoskeletal: Positive for myalgias and joint pain.  Skin: Negative.   Neurological: Negative.   Endo/Heme/Allergies: Positive for polydipsia.  Psychiatric/Behavioral: Negative.     Immunization History  Administered Date(s) Administered  . Tdap 10/20/2006, 10/20/2006   Objective:  BP 150/98 mmHg  Pulse 68  Temp(Src) 98.4 F (36.9 C) (Oral)  Resp 16  Wt 225 lb 9.6 oz (102.331 kg)  LMP  (LMP Unknown)  Physical Exam  Lab Results  Component Value Date   WBC 5.6 01/10/2015    HGB 12.3 10/25/2014   HCT 36.3 01/10/2015   PLT 259 08/16/2014   GLUCOSE 220* 01/10/2015   CHOL 289* 01/10/2015   TRIG 130 01/10/2015   HDL 91 01/10/2015   LDLCALC 172* 01/10/2015   TSH 3.633 10/17/2014   HGBA1C 9.3* 01/10/2015   MICROALBUR 0.2 SUPPR mg/dL 02/19/2009    CMP     Component Value Date/Time   NA 142 01/10/2015 1009   NA 140 10/24/2014 0900   NA 144 08/16/2014 0843   K 3.9 01/10/2015 1009   K 4.5 08/16/2014 0843   CL 103 01/10/2015 1009   CL 110 08/16/2014 0843   CO2 23 01/10/2015 1009   CO2 24 08/16/2014 0843   GLUCOSE 220* 01/10/2015 1009   GLUCOSE 192* 10/24/2014 0900   GLUCOSE 226* 08/16/2014 0843   BUN 10 01/10/2015 1009   BUN 14 10/24/2014 0900   BUN 18 08/16/2014 0843   CREATININE 1.01* 01/10/2015 1009   CREATININE 1.05* 08/16/2014 0843   CREATININE 1.0 08/16/2014   CREATININE 1.11* 08/09/2013 1022   CALCIUM 10.6* 01/10/2015 1009   CALCIUM 10.4* 08/16/2014 0843   CALCIUM 10.5 05/08/2007 2235   PROT 7.4 01/10/2015 1009   PROT 7.8 08/16/2014 0843   PROT 7.1 08/09/2013 1022   ALBUMIN 4.3 01/10/2015 1009   ALBUMIN 4.0 08/16/2014 0843   ALBUMIN 4.1 08/09/2013 1022   AST 60* 01/10/2015 1009   AST 54* 08/16/2014 0843   ALT 40* 01/10/2015 1009   ALT 49 08/16/2014 0843   ALKPHOS 108 01/10/2015 1009   ALKPHOS 91 08/16/2014 0843   BILITOT <0.2 01/10/2015 1009   BILITOT 0.1* 08/16/2014 0843   BILITOT 0.2 08/09/2013 1022   GFRNONAA 63 01/10/2015 1009   GFRNONAA >60 08/16/2014 0843   GFRNONAA 44* 04/19/2014 0857   GFRAA 73 01/10/2015 1009   GFRAA >60 08/16/2014 0843   GFRAA 53* 04/19/2014 0857    Assessment and Plan :   1. Type 2 diabetes mellitus with hyperosmolarity without coma, without long-term current use of insulin ([place]) Did not tolerate the [place] but it did lower FBS to 130. Has rebounded up to 186-200 without it and only using the Metformin. Hgb A1C 9.2 on 01-10-15. Encouraged to get back on the CPAP regularly and recheck diabetes in 2  months.  2. Essential hypertension Elevation today  but states she always get very nervous in the "doctor's office". Continue follow up with cardiologist (Dr. Claiborne Billings) and continue Metoprolol 50 mg BID and Lasix 20 mg qd prn edema. Limit sodium intake and don't use decongestants. Recheck in 2 months.  3. Obstructive sleep apnea Encouraged to follow cardiologist's recommendation to get back on the CPAP at 8 cm H2O every night. OSA diagnosed 2 years ago by sleep study.   [facility] 02/18/2015 11:07 AM

## 2015-02-18 NOTE — Patient Instructions (Signed)

## 2015-02-21 ENCOUNTER — Telehealth: Payer: Self-pay

## 2015-02-21 NOTE — Telephone Encounter (Signed)
pt called left a voice massage that she needed to speak with tina thompson as soon as possible.  no other details.

## 2015-02-21 NOTE — Telephone Encounter (Signed)
left message to please call our office back for more details.

## 2015-02-25 ENCOUNTER — Ambulatory Visit (INDEPENDENT_AMBULATORY_CARE_PROVIDER_SITE_OTHER): Payer: 59 | Admitting: Licensed Clinical Social Worker

## 2015-02-25 DIAGNOSIS — F331 Major depressive disorder, recurrent, moderate: Secondary | ICD-10-CM

## 2015-02-25 DIAGNOSIS — F411 Generalized anxiety disorder: Secondary | ICD-10-CM | POA: Diagnosis not present

## 2015-02-25 NOTE — Progress Notes (Signed)
THERAPIST PROGRESS NOTE  Session Time: 9:10 a.m. -  10:05 a.m.  Participation Level: Active  Behavioral Response: CasualAlertAnxious and Depressed  Type of Therapy: Family Therapy  Treatment Goals addressed: Communication: productive styles of communicating; assertiveness and conflit resolution skills and Coping  Interventions: DBT, Motivational Interviewing, Solution Focused, Strength-based, Counsellor, Supportive, Family Systems and Reframing  Summary: Abigail Figueroa is a 54 y.o. female who presents with steady improvement in depressive and anxious symptoms per observation and her report.  Client and spouse were appreciative of LCSW's referral to resources, the [facility], who was able to pay their [employer] bill.  Mr. Abdelnour was present and both did a fair job of communicating details of recent conflict that the two had.  Both were receptive to LCSW's guidance about clarification in communication and assessing what unrealistic beliefs may be negatively influencing how they feel about each other.  Inadequate pattern of communication between partner & family members along with common irrational beliefs that can contribute to conflict were discussed.  Client discussed dependency in relationship with spouse and her dislike for certain behaviors that he displays.  Mr. Bouse responded to these from his vantage point while also validating client's experience.  Abigail Figueroa was also able to confirm seeing things differently now as spouse described his view point.  Client & spouse discussed examples of situations where there are difficulties with boundaries and boundary setting and self-assertion and this also involves the at times stressful dynamics with their daughter and teen grand-son living in the home.  Session focused on using various techniques to assist the couple to explore different ways of fighting and also to better understand where each one is coming from in terms  of their own beliefs/thoughts about the other person's motives.  We processed a recent situation where client became mad at Mr. Pletcher and did not go to [place] with him the following day.  Both voiced agreement that these are skills that they can try to use when a difference of opinion occurs.  No immediate risks of harm to self or others.  Financially the couple still is struggling since Mr. Dondiego does not have a job.  He does have at least one possible job pending.    Abigail Figueroa is aware that she can continue to come to this clinic for OPT to see [facility] or be assisted with another referral.  Client and her husband expressed an interest in continuing OPT with LCSW and therapist's new location.  She voiced understanding about the possibility of a gap in services due to time involved with insurance credentialing.  Client and spouse indicated "We enjoy you so much. You've really helped [place] and we would prefer to keep seeing you."  Suicidal/Homicidal: Nowithout intent/plan  Therapist Response:  LCSW reinforced client and spouse's progress in terms of re-framing and clarifying with one another the content of their messages to avoid mis-interpretation. Reinforced and highlighted in couple's recent example, the role that thinking errors can play in mis-communication which often results in conflict.  Commended the two of them on practicing skills and coping strategies/information shared during prior sessions. Assisted both with re-framing her certain deeply held believes, some of which are family of origin based, that may interfere with assertive and healthier communication.  Supportive psychotherapy with insight was also provided to highlight progress and areas for future focus.  Validated positions and emotions held by both and discussed the concept of dialectics.  Reinforced use of OPT here at [facility] and  urged questions and feedback about therapeutic experience. Assured that LCSW or office staff could  assist them with referral to another OPT provider due to possibility of gap in services as LCSW works on [employer].  Plan:   LCSW and client/spouse terminated services and client will follow up with OPT per client's choices. Abigail Figueroa will remain compliant with her medications and keep all medical appointments.  She is aware that she can call should questions or concerns arise.  Diagnosis: Major Depressive Disorder   Generalized Anxiety Disorder   Rule Out PTSD    Miguel Dibble, LCSW 02/25/2015

## 2015-02-28 ENCOUNTER — Encounter: Payer: Self-pay | Admitting: Family Medicine

## 2015-02-28 ENCOUNTER — Ambulatory Visit (INDEPENDENT_AMBULATORY_CARE_PROVIDER_SITE_OTHER): Payer: Medicare Other | Admitting: Family Medicine

## 2015-02-28 VITALS — BP 152/98 | HR 74 | Temp 98.3°F | Resp 18

## 2015-02-28 DIAGNOSIS — J0100 Acute maxillary sinusitis, unspecified: Secondary | ICD-10-CM | POA: Diagnosis not present

## 2015-02-28 DIAGNOSIS — J300 Vasomotor rhinitis: Secondary | ICD-10-CM

## 2015-02-28 MED ORDER — PREDNISONE 5 MG PO TABS
5.0000 mg | ORAL_TABLET | Freq: Every day | ORAL | Status: DC
Start: 2015-02-28 — End: 2015-04-09

## 2015-02-28 MED ORDER — AZITHROMYCIN 250 MG PO TABS
ORAL_TABLET | ORAL | Status: DC
Start: 2015-02-28 — End: 2015-04-09

## 2015-02-28 NOTE — Progress Notes (Signed)
Patient ID: Abigail Figueroa, female   DOB: 06/26/1960, 54 y.o.   MRN: [id number] Name: Abigail Figueroa   MRN: [id number]    DOB: November 02, 1960   Date:02/28/2015       Progress Note  Subjective  Chief Complaint  Chief Complaint  Patient presents with  . Sinusitis    Sinusitis This is a new problem. The current episode started 1 to 4 weeks ago. The problem has been gradually worsening since onset. There has been no fever. Associated symptoms include chills, congestion, coughing, headaches, sinus pressure and sneezing. (Dry hacking cough.) Treatments tried: Some antihistamine - Zyrtec.   Past Medical History  Diagnosis Date  . Edema   . Heart palpitations June 2013    Event monitor showing sinus tachycardia, PACs with couplets and triplets.  . Obesity   . Diabetes ([place])   . Hypercholesterolemia   . Seizure disorder ([place])   . Migraine   . OSA (obstructive sleep apnea)     has been on continuous positive airway pressure  . Hx of echocardiogram 11/18/2009    showed normal left ventricular function, mild left ventricular hypertrophy, and no significant valve abnormalities.  . History of stress test 11/25/2009    Normal myocardial perfusion imaging  . Hypertension   . Dysrhythmia     palpitations  . Sleep apnea     CPAP  . Diabetes mellitus without complication ([place])   . Depression   . Anxiety   . Headache     migraines, gets botox injections   Social History  Substance Use Topics  . Smoking status: Former Smoker    Types: Cigarettes    Quit date: 01/03/1979  . Smokeless tobacco: Never Used     Comment: quit in 1984  . Alcohol Use: No    Current outpatient prescriptions:  .  baclofen (LIORESAL) 10 MG tablet, Take 10 mg by mouth 2 (two) times daily as needed., Disp: , Rfl:  .  BOTOX 100 UNITS SOLR injection, , Disp: , Rfl:  .  carbamazepine (TEGRETOL XR) 400 MG 12 hr tablet, Take 400 mg by mouth 2 (two) times daily., Disp: , Rfl:  .  chlorproMAZINE (THORAZINE) 25 MG  tablet, Take 25 mg by mouth 3 (three) times daily., Disp: , Rfl:  .  ezetimibe-simvastatin (VYTORIN) 10-40 MG per tablet, Take 1 tablet by mouth at bedtime, Disp: 30 tablet, Rfl: 4 .  furosemide (LASIX) 20 MG tablet, Take 1 tablet (20 mg total) by mouth daily., Disp: 30 tablet, Rfl: 6 .  lamoTRIgine (LAMICTAL) 100 MG tablet, Take 100 mg by mouth 2 (two) times daily., Disp: , Rfl:  .  lithium 300 MG tablet, Take 2 tablets (600 mg total) by mouth at bedtime., Disp: 60 tablet, Rfl: 2 .  metFORMIN (GLUCOPHAGE) 500 MG tablet, Take 2 tablets in the morning, 1 tablet at lunch time and 2 tablets in the evening, Disp: 150 tablet, Rfl: 3 .  metoprolol (LOPRESSOR) 50 MG tablet, Take 1 tablet (50 mg total) by mouth 2 (two) times daily., Disp: 60 tablet, Rfl: 6 .  ONE TOUCH ULTRA TEST test strip, , Disp: , Rfl:  .  Vilazodone HCl (VIIBRYD) 40 MG TABS, Take 1 tablet (40 mg total) by mouth daily., Disp: 30 tablet, Rfl: 2  Allergies  Allergen Reactions  . Morphine And Related Hives    Nausea and vomiting   . Oxycontin [Oxycodone Hcl] Swelling, Hives and Itching   Review of Systems  Constitutional: Positive for chills.  HENT:  Positive for congestion, sinus pressure and sneezing.   Eyes: Positive for discharge.  Respiratory: Positive for cough.   Cardiovascular: Negative.   Gastrointestinal: Negative.   Genitourinary: Negative.   Musculoskeletal: Negative.   Skin: Negative.   Neurological: Positive for headaches.  Endo/Heme/Allergies: Negative.   Psychiatric/Behavioral: Negative.    Objective  Filed Vitals:   02/28/15 1141  BP: 152/98  Pulse: 74  Temp: 98.3 F (36.8 C)  TempSrc: Oral  Resp: 18   Physical Exam  Constitutional: She is oriented to person, place, and time and well-developed, well-nourished, and in no distress.  HENT:  Head: Normocephalic and atraumatic.  Right Ear: External ear normal.  Left Ear: External ear normal.  Turbinates 4+ swollen with clear watery drainage. Tender  maxillary sinuses with puffiness.  Eyes: Conjunctivae and EOM are normal.  Neck: Neck supple.  Cardiovascular: Normal rate and regular rhythm.   Pulmonary/Chest: Effort normal and breath sounds normal.  Abdominal: Soft. Bowel sounds are normal.  Lymphadenopathy:    She has no cervical adenopathy.  Neurological: She is alert and oriented to person, place, and time.   Recent Results (from the past 2160 hour(s))  Hemoglobin A1c     Status: Abnormal   Collection Time: 01/10/15 10:09 AM  Result Value Ref Range   Hgb A1c MFr Bld 9.3 (H) 4.8 - 5.6 %    Comment:          Pre-diabetes: 5.7 - 6.4          Diabetes: >6.4          Glycemic control for adults with diabetes: <7.0    Est. average glucose Bld gHb Est-mCnc 220 mg/dL  CBC with Differential/Platelet     Status: None   Collection Time: 01/10/15 10:09 AM  Result Value Ref Range   WBC 5.6 3.4 - 10.8 x10E3/uL   RBC 4.31 3.77 - 5.28 x10E6/uL   Hemoglobin 11.7 11.1 - 15.9 g/dL   Hematocrit 36.3 34.0 - 46.6 %   MCV 84 79 - 97 fL   MCH 27.1 26.6 - 33.0 pg   MCHC 32.2 31.5 - 35.7 g/dL   RDW 14.9 12.3 - 15.4 %   Platelets 280 150 - 379 x10E3/uL   Neutrophils 57 %   Lymphs 33 %   Monocytes 4 %   Eos 5 %   Basos 1 %   Neutrophils Absolute 3.2 1.4 - 7.0 x10E3/uL   Lymphocytes Absolute 1.9 0.7 - 3.1 x10E3/uL   Monocytes Absolute 0.2 0.1 - 0.9 x10E3/uL   EOS (ABSOLUTE) 0.3 0.0 - 0.4 x10E3/uL   Basophils Absolute 0.0 0.0 - 0.2 x10E3/uL   Immature Granulocytes 0 %   Immature Grans (Abs) 0.0 0.0 - 0.1 x10E3/uL  Comprehensive metabolic panel     Status: Abnormal   Collection Time: 01/10/15 10:09 AM  Result Value Ref Range   Glucose 220 (H) 65 - 99 mg/dL   BUN 10 6 - 24 mg/dL   Creatinine, Ser 1.01 (H) 0.57 - 1.00 mg/dL   GFR calc non Af Amer 63 >59 mL/min/1.73   GFR calc Af Amer 73 >59 mL/min/1.73   BUN/Creatinine Ratio 10 9 - 23   Sodium 142 134 - 144 mmol/L   Potassium 3.9 3.5 - 5.2 mmol/L   Chloride 103 97 - 108 mmol/L   CO2 23  18 - 29 mmol/L   Calcium 10.6 (H) 8.7 - 10.2 mg/dL   Total Protein 7.4 6.0 - 8.5 g/dL   Albumin 4.3 3.5 -  5.5 g/dL   Globulin, Total 3.1 1.5 - 4.5 g/dL   Albumin/Globulin Ratio 1.4 1.1 - 2.5   Bilirubin Total <0.2 0.0 - 1.2 mg/dL   Alkaline Phosphatase 108 39 - 117 IU/L   AST 60 (H) 0 - 40 IU/L   ALT 40 (H) 0 - 32 IU/L  Lipid panel     Status: Abnormal   Collection Time: 01/10/15 10:09 AM  Result Value Ref Range   Cholesterol, Total 289 (H) 100 - 199 mg/dL   Triglycerides 130 0 - 149 mg/dL   HDL 91 >39 mg/dL    Comment: According to ATP-III Guidelines, HDL-C >59 mg/dL is considered a negative risk factor for CHD.    VLDL Cholesterol Cal 26 5 - 40 mg/dL   LDL Calculated 172 (H) 0 - 99 mg/dL   Chol/HDL Ratio 3.2 0.0 - 4.4 ratio units    Comment:                                   T. Chol/HDL Ratio                                             Men  Women                               1/2 Avg.Risk  3.4    3.3                                   Avg.Risk  5.0    4.4                                2X Avg.Risk  9.6    7.1                                3X Avg.Risk 23.4   11.0    Assessment & Plan  1. Vasomotor rhinitis Onset over the past week. No relief from the trial of Zyrtec. Will treat with prednisone taper and may use Netti-Pot irrigation prn. Recheck prn. - predniSONE (DELTASONE) 5 MG tablet; Take 1 tablet (5 mg total) by mouth daily with breakfast. Taper daily by one tablet (6,5,4,3,2,1)  Dispense: 21 tablet; Refill: 1  2. Acute maxillary sinusitis, recurrence not specified Onset with rhinitis. Developed occasional purulent mucus blown from head with toothache/sinus pain. Treat with Mucinex-DM and Z-pak. Recheck in a week if no better. May use Netti-Pot irrigation prn. - azithromycin (ZITHROMAX) 250 MG tablet; Two tablets by mouth today then one daily for 4 days  Dispense: 6 tablet; Refill: 0

## 2015-03-03 ENCOUNTER — Other Ambulatory Visit: Payer: Self-pay | Admitting: Cardiovascular Disease

## 2015-03-05 DIAGNOSIS — G43719 Chronic migraine without aura, intractable, without status migrainosus: Secondary | ICD-10-CM | POA: Diagnosis not present

## 2015-03-20 DIAGNOSIS — G43719 Chronic migraine without aura, intractable, without status migrainosus: Secondary | ICD-10-CM | POA: Diagnosis not present

## 2015-03-25 ENCOUNTER — Ambulatory Visit: Payer: Self-pay | Admitting: Family Medicine

## 2015-04-01 ENCOUNTER — Ambulatory Visit: Payer: Self-pay | Admitting: Psychiatry

## 2015-04-06 ENCOUNTER — Emergency Department: Payer: Medicare Other

## 2015-04-06 ENCOUNTER — Emergency Department
Admission: EM | Admit: 2015-04-06 | Discharge: 2015-04-06 | Disposition: A | Discharge status: Home / Self Care | Payer: Medicare Other | Attending: Emergency Medicine | Admitting: Emergency Medicine

## 2015-04-06 ENCOUNTER — Encounter: Payer: Self-pay | Admitting: Emergency Medicine

## 2015-04-06 DIAGNOSIS — Z87891 Personal history of nicotine dependence: Secondary | ICD-10-CM | POA: Diagnosis not present

## 2015-04-06 DIAGNOSIS — Z792 Long term (current) use of antibiotics: Secondary | ICD-10-CM | POA: Diagnosis not present

## 2015-04-06 DIAGNOSIS — Z7984 Long term (current) use of oral hypoglycemic drugs: Secondary | ICD-10-CM | POA: Diagnosis not present

## 2015-04-06 DIAGNOSIS — E119 Type 2 diabetes mellitus without complications: Secondary | ICD-10-CM | POA: Diagnosis not present

## 2015-04-06 DIAGNOSIS — M545 Low back pain: Secondary | ICD-10-CM | POA: Diagnosis not present

## 2015-04-06 DIAGNOSIS — G8929 Other chronic pain: Secondary | ICD-10-CM | POA: Diagnosis not present

## 2015-04-06 DIAGNOSIS — R202 Paresthesia of skin: Secondary | ICD-10-CM | POA: Diagnosis not present

## 2015-04-06 DIAGNOSIS — M5431 Sciatica, right side: Secondary | ICD-10-CM | POA: Diagnosis not present

## 2015-04-06 DIAGNOSIS — Z7952 Long term (current) use of systemic steroids: Secondary | ICD-10-CM | POA: Insufficient documentation

## 2015-04-06 DIAGNOSIS — M549 Dorsalgia, unspecified: Secondary | ICD-10-CM | POA: Diagnosis present

## 2015-04-06 DIAGNOSIS — R200 Anesthesia of skin: Secondary | ICD-10-CM | POA: Diagnosis not present

## 2015-04-06 DIAGNOSIS — I10 Essential (primary) hypertension: Secondary | ICD-10-CM | POA: Insufficient documentation

## 2015-04-06 LAB — URINE DRUG SCREEN, QUALITATIVE (ARMC ONLY)
AMPHETAMINES, UR SCREEN: NOT DETECTED
BENZODIAZEPINE, UR SCRN: NOT DETECTED
Barbiturates, Ur Screen: NOT DETECTED
Cannabinoid 50 Ng, Ur ~~LOC~~: NOT DETECTED
Cocaine Metabolite,Ur ~~LOC~~: NOT DETECTED
MDMA (ECSTASY) UR SCREEN: NOT DETECTED
METHADONE SCREEN, URINE: NOT DETECTED
Opiate, Ur Screen: NOT DETECTED
Phencyclidine (PCP) Ur S: NOT DETECTED
Tricyclic, Ur Screen: NOT DETECTED

## 2015-04-06 MED ORDER — HYDROCODONE-ACETAMINOPHEN 5-325 MG PO TABS
1.0000 | ORAL_TABLET | Freq: Once | ORAL | Status: AC
  Administered 2015-04-06: 1 via ORAL
  Filled 2015-04-06: qty 1

## 2015-04-06 MED ORDER — KETOROLAC TROMETHAMINE 30 MG/ML IJ SOLN
30.0000 mg | Freq: Once | INTRAMUSCULAR | Status: AC
  Administered 2015-04-06: 30 mg via INTRAVENOUS
  Filled 2015-04-06: qty 1

## 2015-04-06 MED ORDER — HYDROCODONE-ACETAMINOPHEN 5-325 MG PO TABS
1.0000 | ORAL_TABLET | ORAL | Status: DC | PRN
Start: 2015-04-06 — End: 2015-07-17

## 2015-04-06 MED ORDER — KETAMINE HCL 10 MG/ML IJ SOLN: 0.2000 mg/kg | Freq: Once | INTRAMUSCULAR | Status: AC

## 2015-04-06 MED ORDER — KETAMINE HCL 50 MG/ML IJ SOLN
INTRAMUSCULAR | Status: AC
  Administered 2015-04-06: 21 mg
  Filled 2015-04-06: qty 10

## 2015-04-06 MED ORDER — FENTANYL CITRATE (PF) 100 MCG/2ML IJ SOLN
50.0000 ug | Freq: Once | INTRAMUSCULAR | Status: AC
  Administered 2015-04-06: 50 ug via INTRAVENOUS
  Filled 2015-04-06: qty 2

## 2015-04-06 NOTE — ED Notes (Signed)
ED paramedic Tammy assisted pt to standing beside the stretcher but was unable to take any steps; MD aware; pt up for discharge

## 2015-04-06 NOTE — ED Notes (Signed)
Pt had difficulty getting from wheelchair to the bed and required 2 assistance.  Difficulty moving in bed without assistance.  Pt reports pain with moving.  Pt reports incontinence because it is too painful for her to move.  Pain with palpation to lower back as well. Denies falls.

## 2015-04-06 NOTE — ED Provider Notes (Addendum)
[facility] Emergency Department Provider Note  ____________________________________________   I have reviewed the triage vital signs and the nursing notes.   HISTORY  Chief Complaint Back Pain    HPI Abigail Figueroa is a 55 y.o. female presents to the complaining of back pain which is been there for a week getting gradually worse until over the last 2 days it has been too hard for her to move around. She has no actual incontinence of bowel or bladder but sometimes it hurts so much she does not want to get up and go to the bathroom. She denies any fever or chills nausea or vomiting. She states she has some tingling in her leg sometimes but no weakness. Patient has chronic recurring back pain has been on multiple different pain medications for this in the past. She has had "swelling" with OxyContin and itching with morphine. She states she handles other narcotic pain medications well. She denies any injury.  Past Medical History  Diagnosis Date  . Edema   . Heart palpitations June 2013    Event monitor showing sinus tachycardia, PACs with couplets and triplets.  . Obesity   . Diabetes ([place])   . Hypercholesterolemia   . Seizure disorder ([place])   . Migraine   . OSA (obstructive sleep apnea)     has been on continuous positive airway pressure  . Hx of echocardiogram 11/18/2009    showed normal left ventricular function, mild left ventricular hypertrophy, and no significant valve abnormalities.  . History of stress test 11/25/2009    Normal myocardial perfusion imaging  . Hypertension   . Dysrhythmia     palpitations  . Sleep apnea     CPAP  . Diabetes mellitus without complication ([place])   . Depression   . Anxiety   . Headache     migraines, gets botox injections    Patient Active Problem List   Diagnosis Date Noted  . History of brain disorder 11/05/2014  . H/O: obesity 11/05/2014  . Depression, major, recurrent, moderate ([place]) 11/05/2014  .  Difficulty with family 11/05/2014  . Feeling stressed out 11/05/2014  . H/O: HTN (hypertension) 11/05/2014  . H/O elevated lipids 11/05/2014  . Anxiety, generalized 11/05/2014  . Migraine headache 11/05/2014  . Moderate recurrent major depression ([place]) 10/16/2014  . Generalized anxiety disorder 10/16/2014  . CHF (congestive heart failure) ([place]) 10/14/2014  . Edema 10/14/2014  . Depressive disorder 10/14/2014  . Obstructive sleep apnea 10/14/2014  . Diabetes ([place]) 10/14/2014  . Essential hypertension 10/14/2014  . Epilepsy ([place]) 10/14/2014  . Migraine 10/14/2014  . Hyperlipemia 10/14/2014  . Dysthymic disorder 10/14/2014  . Seizure ([place]) 10/14/2014  . Hypercholesteremia 10/14/2014  . Palpitation 10/14/2014  . Depression 10/14/2014  . Dyspepsia 10/14/2014  . Hematochezia 10/14/2014  . Urge incontinence 10/14/2014  . Hair thinning 10/14/2014  . Dermatitis, eczematoid 09/10/2014  . Acid indigestion 09/10/2014  . Alopecia 09/10/2014  . Blood in feces 09/10/2014  . H/O hypercholesterolemia 09/10/2014  . Arthralgia of hip 09/10/2014  . Feeling bilious 09/10/2014  . Awareness of heartbeats 09/10/2014  . Pain in the wrist 09/10/2014  . Convulsions ([place]) 09/10/2014  . Urge incontinence 09/10/2014  . Snapping thumb syndrome 04/19/2014  . Diabetes mellitus ([place]) 03/14/2014  . De Quervain's disease (radial styloid tenosynovitis) 03/08/2014  . Mixed hyperlipidemia 01/15/2013  . Encounter for long-term (current) use of other medications - statin 01/15/2013  . Fatigue 01/15/2013  . Heart palpation 01/15/2013  . Obesity (BMI 30-39.9)  01/02/2013  . CCF (congestive cardiac failure) ([place]) 11/13/2009  . Malaise and fatigue 02/17/2009  . Sprain and strain of interphalangeal (joint) of hand 10/23/2007  . HEADACHE 06/23/2007  . DIABETES MELLITUS, TYPE II 05/08/2007  . HYPERCALCEMIA 05/08/2007  . Essential hypertension 05/08/2007  . Sleep apnea 05/08/2007  . Edema 05/08/2007  . DIARRHEA,  CHRONIC 05/08/2007  . Clinical depression 10/20/2006  . Obstructive apnea 03/22/2006  . Diabetes mellitus, type 2 ([place]) 11/12/2005  . Depression, neurotic 11/12/2005  . Epilepsy ([place]) 11/12/2005  . Combined fat and carbohydrate induced hyperlipemia 11/12/2005  . Acute onset aura migraine 11/12/2005    Past Surgical History  Procedure Laterality Date  . Cholecystectomy  1980s  . Appendectomy  1980s  . Abdominal hysterectomy  1990    without BSO  . Back surgery  1900s 2001    x2 with "cage put in"  . Knee surgery Right 2012    meniscus tear  . Abdominal hysterectomy  1990  . Cholecystectomy  1980  . Tubal ligation  1982  . Back surgery  2001  . Knee surgery  2012  . Laminectomy  1995  . Carpal tunnel release Bilateral   . Bilateral salpingoophorectomy  1990  . Dorsal compartment release Left 10/25/2014    Procedure: LEFT FIRST  DORSAL COMPARTMENT RELEASE AND RADIAL TENOSYNOVECTOMY ;  Surgeon: Roseanne Kaufman, MD;  Location: [facility];  Service: Orthopedics;  Laterality: Left;  . Hand surgery    . Wrist surgery Right     Current Outpatient Rx  Name  Route  Sig  Dispense  Refill  . azithromycin (ZITHROMAX) 250 MG tablet      Two tablets by mouth today then one daily for 4 days   6 tablet   0   . baclofen (LIORESAL) 10 MG tablet   Oral   Take 10 mg by mouth 2 (two) times daily as needed.         Marland Kitchen BOTOX 100 UNITS SOLR injection               . carbamazepine (TEGRETOL XR) 400 MG 12 hr tablet   Oral   Take 400 mg by mouth 2 (two) times daily.         . chlorproMAZINE (THORAZINE) 25 MG tablet   Oral   Take 25 mg by mouth 3 (three) times daily.         . furosemide (LASIX) 20 MG tablet   Oral   Take 1 tablet (20 mg total) by mouth daily.   30 tablet   6   . lamoTRIgine (LAMICTAL) 100 MG tablet   Oral   Take 100 mg by mouth 2 (two) times daily.         Marland Kitchen lithium 300 MG tablet   Oral   Take 2 tablets (600 mg total) by mouth at  bedtime.   60 tablet   2   . metFORMIN (GLUCOPHAGE) 500 MG tablet      Take 2 tablets in the morning, 1 tablet at lunch time and 2 tablets in the evening   150 tablet   3   . metoprolol (LOPRESSOR) 50 MG tablet   Oral   Take 1 tablet (50 mg total) by mouth 2 (two) times daily.   60 tablet   6   . ONE TOUCH ULTRA TEST test strip               . predniSONE (DELTASONE) 5 MG tablet  Oral   Take 1 tablet (5 mg total) by mouth daily with breakfast. Taper daily by one tablet (6,5,4,3,2,1)   21 tablet   1   . Vilazodone HCl (VIIBRYD) 40 MG TABS   Oral   Take 1 tablet (40 mg total) by mouth daily.   30 tablet   2     Hold filling until patient calls to request refill ...   . VYTORIN 10-40 MG tablet      TAKE 1 TABLET BY MOUTH AT BEDTIME   30 tablet   11     Allergies Morphine and related and Oxycontin  Family History  Problem Relation Age of Onset  . Sarcoidosis Mother   . Seizures Mother   . Heart attack Mother   . Alzheimer's disease Maternal Grandmother   . Dementia Maternal Grandmother   . Hypertension Maternal Grandfather   . Diabetes Maternal Grandfather     Social History Social History  Substance Use Topics  . Smoking status: Former Smoker    Types: Cigarettes    Quit date: 01/03/1979  . Smokeless tobacco: Never Used     Comment: quit in 1984  . Alcohol Use: No    Review of Systems Constitutional: No fever/chills Eyes: No visual changes. ENT: No sore throat. No stiff neck no neck pain Cardiovascular: Denies chest pain. Respiratory: Denies shortness of breath. Gastrointestinal:   no vomiting.  No diarrhea.  No constipation. Genitourinary: Negative for dysuria. Musculoskeletal: Negative lower extremity swelling Skin: Negative for rash. Neurological: Negative for headaches, focal weakness or numbness. 10-point ROS otherwise negative.  ____________________________________________   PHYSICAL EXAM:  VITAL SIGNS: ED Triage Vitals  Enc  Vitals Group     BP 04/06/15 1445 175/89 mmHg     Pulse Rate 04/06/15 1445 96     Resp 04/06/15 1445 18     Temp 04/06/15 1445 99.3 F (37.4 C)     Temp Source 04/06/15 1445 Oral     SpO2 04/06/15 1445 97 %     Weight 04/06/15 1445 226 lb (102.513 kg)     Height 04/06/15 1445 5\' 3"  (1.6 m)     Head Cir --      Peak Flow --      Pain Score 04/06/15 1508 10     Pain Loc --      Pain Edu? --      Excl. in [place]? --     Constitutional: Alert and oriented. Well appearing and in no acute distress. Eyes: Conjunctivae are normal. PERRL. EOMI. Head: Atraumatic. Nose: No congestion/rhinnorhea. Mouth/Throat: Mucous membranes are moist.  Oropharynx non-erythematous. Neck: No stridor.   Nontender with no meningismus Cardiovascular: Normal rate, regular rhythm. Grossly normal heart sounds.  Good peripheral circulation. Respiratory: Normal respiratory effort.  No retractions. Lungs CTAB. Abdominal: Soft and nontender. No distention. No guarding no rebound Back:  Significant right paraspinal discomfort with no evidence of shingles rash or infection, there is pain in the sciatic fossa as well is no midline tenderness there are no lesions noted. there is no CVA tenderness Musculoskeletal: No lower extremity tenderness. No joint effusions, no DVT signs strong distal pulses no edema Neurologic:  Normal speech and language. No gross focal neurologic deficits are appreciated. Limited by patient's pain, positive straight leg raise at 1 essentially on the right. She has intact sensation strong distal pulses, no saddle anesthesia reflexes are 2+ and symmetric Skin:  Skin is warm, dry and intact. No rash noted. Psychiatric: Mood and affect are  normal. Speech and behavior are normal.  ____________________________________________   LABS (all labs ordered are listed, but only abnormal results are displayed)  Labs Reviewed - No data to display ____________________________________________  EKG  I  personally interpreted any EKGs ordered by me or triage  ____________________________________________  RADIOLOGY  I reviewed any imaging ordered by me or triage that were performed during my shift ____________________________________________   PROCEDURES  Procedure(s) performed: None  Critical Care performed: None  ____________________________________________   INITIAL IMPRESSION / ASSESSMENT AND PLAN / ED COURSE  Pertinent labs & imaging results that were available during my care of the patient were reviewed by me and considered in my medical decision making (see chart for details).  Patient with significant back pain with right-sided sciatic radiation, no signs or symptoms of cauda equina syndrome. Again, patient does not have incontinence of bladder, it was just too painful for her to get up to go to the bathroom. Patient denies any weakness. Difficult to fully assess neurologic function at this time given patient's limitations from pain, given her history of significant allergies to morphine pain medications, although she can take some narcotics and not others, I will try dose of ketamine and ketorolac to see if that helps. We'll reassess closely.  ----------------------------------------- 6:03 PM on 04/06/2015 -----------------------------------------    The patient has had some mild relief with the medications given Cipro but she states her pain is still too great to go home. She is requesting narcotic pain medication. She understands there is always a risk if we give narcotic pain medication that she might have an allergic reaction even if we use medication that is not likely to do so. We will give her IV fentanyl. Because she has hardware in her back we'll obtain an x-ray. Her abdomen remains benign is no evidence of AAA, and when she is relaxed and can fully range her leg with no evidence of neurologic deficit  ----------------------------------------- 8:07 PM on  04/06/2015 -----------------------------------------  Patient much more comfortable. Possibly we did have to give her fentanyl as nonnarcotic pain medication was not sufficient. Patient did not take her blood pressure medication this morning. She states her blood pressure always runs high when she is in pain. Serial abdominal exams betray no evidence of AAA, serial neurologic exams are completely normal, patient has had sciatica off and on for at least 17 years. Do not think she meets criteria for emergent MRI. Patient has had no symptoms of cauda equina syndrome. She's thing much better. She states she can take hydrocodone without any kind of allergic reaction. I will give her the first dose here to ensure that that is true And we will see if we can get her safely home with close outpatient follow-up with her doctor. Return precautions and follow-up given and understood. Patient does understand there is a risk to the [place] giving narcotic pain medication but she would like to take them. Obviously I have a low suspicion she has had hydrocodone before that any adverse reaction. Patient states she does need something to take at home. Patient while stating that she is in a great of pain has also managed to eat a good meal here and taking a few naps while present which I think is also a reassuring sign ____________________________________________   FINAL CLINICAL IMPRESSION(S) / ED DIAGNOSES  Final diagnoses:  None     Schuyler Amor, MD 04/06/15 [place], MD 04/06/15 [place], MD 04/06/15 [phone]  Schuyler Amor, MD 04/06/15 2008  Schuyler Amor, MD 04/06/15 [phone]

## 2015-04-06 NOTE — ED Notes (Signed)
Pt with lower right sided back pain that radiates down right leg with toe numbness and tingling.  Pt with history of back surgery with fusion.

## 2015-04-07 ENCOUNTER — Telehealth: Payer: Self-pay | Admitting: Family Medicine

## 2015-04-07 NOTE — Telephone Encounter (Signed)
Please advise 

## 2015-04-07 NOTE — Telephone Encounter (Signed)
Pt stated she went to ER last night and was advised she needed to see Dr. Frederich Cha at [facility]. Pt wanted to know if she needed an OV to get the referral or if it could be done off the ER visit. Please advise. Thanks TNP

## 2015-04-08 NOTE — Telephone Encounter (Addendum)
Schedule appointment to evaluate progress of back pain. Be sure she brings medications given at the ER and home medications to review Thursday.

## 2015-04-08 NOTE — Telephone Encounter (Signed)
Pt called back to get an update. Pt stated that she is in a lot of pain. Please advise. Thanks TNP

## 2015-04-09 ENCOUNTER — Other Ambulatory Visit: Payer: Self-pay

## 2015-04-09 NOTE — Telephone Encounter (Signed)
Patient called back to schedule a follow up appointment with [name] on Thursday.

## 2015-04-09 NOTE — Telephone Encounter (Signed)
LMTCB to schedule a follow up appointment for Thursday per [name].

## 2015-04-10 ENCOUNTER — Encounter: Payer: Self-pay | Admitting: Family Medicine

## 2015-04-10 ENCOUNTER — Ambulatory Visit (INDEPENDENT_AMBULATORY_CARE_PROVIDER_SITE_OTHER): Payer: Medicare Other | Admitting: Family Medicine

## 2015-04-10 VITALS — BP 128/84 | HR 76 | Temp 97.9°F | Resp 16

## 2015-04-10 DIAGNOSIS — L309 Dermatitis, unspecified: Secondary | ICD-10-CM | POA: Diagnosis not present

## 2015-04-10 DIAGNOSIS — M545 Low back pain, unspecified: Secondary | ICD-10-CM

## 2015-04-10 DIAGNOSIS — Z9889 Other specified postprocedural states: Secondary | ICD-10-CM | POA: Diagnosis not present

## 2015-04-10 MED ORDER — FLUOCINONIDE 0.05 % EX OINT: 1.0000 "application " | TOPICAL_OINTMENT | Freq: Two times a day (BID) | CUTANEOUS | Status: DC

## 2015-04-10 MED ORDER — BACLOFEN 10 MG PO TABS: 10.0000 mg | ORAL_TABLET | Freq: Three times a day (TID) | ORAL | Status: DC

## 2015-04-10 NOTE — Progress Notes (Signed)
Patient ID: Abigail Figueroa, female   DOB: [birth year], 54 y.o.   MRN: [id number]   Patient: Abigail Figueroa Female    DOB: March 13, 1961   54 y.o.   MRN: [id number] Visit Date: 04/10/2015  Today's Provider: Vernie Murders, PA   Chief Complaint  Patient presents with  . Back Pain  . Nasal Congestion   Subjective:    HPI  This 54 year old female had sudden spasms in the right lower back with radiation to the right toes over the past week. Started after walking at home. Worsened and went to ER on 04-06-15 and diagnosed with sciatic pain. Still having acute pain with movement Norco QID and Baclofen 10 mg BID. Describes pain as a pressure pain in the right lower back with "swelling sensation". Not much relief from heating pad or [place] Pat[place]. States she is having difficulty going to the bathroom due to pain with ambulation (presents in a wheelchair today). Has a history of lumbar laminectomy in 1995 and more back surgery with fusion in 2001 by Dr. Mauri Pole.   Previous Medications   BACLOFEN (LIORESAL) 10 MG TABLET    Take 10 mg by mouth 2 (two) times daily as needed.   BOTOX 100 UNITS SOLR INJECTION       CARBAMAZEPINE (TEGRETOL XR) 400 MG 12 HR TABLET    Take 400 mg by mouth 2 (two) times daily.   CHLORPROMAZINE (THORAZINE) 25 MG TABLET    Take 25 mg by mouth 3 (three) times daily.   FUROSEMIDE (LASIX) 20 MG TABLET    Take 1 tablet (20 mg total) by mouth daily.   HYDROCODONE-ACETAMINOPHEN (NORCO) 5-325 MG TABLET    Take 1 tablet by mouth every 4 (four) hours as needed for moderate pain.   LAMOTRIGINE (LAMICTAL) 100 MG TABLET    Take 100 mg by mouth 2 (two) times daily.   LITHIUM 300 MG TABLET    Take 2 tablets (600 mg total) by mouth at bedtime.   METFORMIN (GLUCOPHAGE) 500 MG TABLET    Take 2 tablets in the morning, 1 tablet at lunch time and 2 tablets in the evening   METOPROLOL (LOPRESSOR) 50 MG TABLET    Take 1 tablet (50 mg total) by mouth 2 (two) times daily.   ONE TOUCH ULTRA TEST TEST  STRIP       VILAZODONE HCL (VIIBRYD) 40 MG TABS    Take 1 tablet (40 mg total) by mouth daily.   VYTORIN 10-40 MG TABLET    TAKE 1 TABLET BY MOUTH AT BEDTIME   Allergies  Allergen Reactions  . Morphine And Related Hives    Nausea and vomiting   . Oxycontin [Oxycodone Hcl] Swelling, Hives and Itching    Past Surgical History  Procedure Laterality Date  . Cholecystectomy  1980s  . Appendectomy  1980s  . Abdominal hysterectomy  1990    without BSO  . Back surgery  1900s 2001    x2 with "cage put in"  . Knee surgery Right 2012    meniscus tear  . Abdominal hysterectomy  1990  . Cholecystectomy  1980  . Tubal ligation  1982  . Back surgery  2001  . Knee surgery  2012  . Laminectomy  1995  . Carpal tunnel release Bilateral   . Bilateral salpingoophorectomy  1990  . Dorsal compartment release Left 10/25/2014    Procedure: LEFT FIRST  DORSAL COMPARTMENT RELEASE AND RADIAL TENOSYNOVECTOMY ;  Surgeon: Roseanne Kaufman, MD;  Location:  [facility];  Service: Orthopedics;  Laterality: Left;  . Hand surgery    . Wrist surgery Right    Family History  Problem Relation Age of Onset  . Sarcoidosis Mother   . Seizures Mother   . Heart attack Mother   . Alzheimer's disease Maternal Grandmother   . Dementia Maternal Grandmother   . Hypertension Maternal Grandfather   . Diabetes Maternal Grandfather    Patient Active Problem List   Diagnosis Date Noted  . History of brain disorder 11/05/2014  . H/O: obesity 11/05/2014  . Depression, major, recurrent, moderate ([place]) 11/05/2014  . Difficulty with family 11/05/2014  . Feeling stressed out 11/05/2014  . H/O: HTN (hypertension) 11/05/2014  . H/O elevated lipids 11/05/2014  . Anxiety, generalized 11/05/2014  . Migraine headache 11/05/2014  . Moderate recurrent major depression ([place]) 10/16/2014  . Generalized anxiety disorder 10/16/2014  . CHF (congestive heart failure) ([place]) 10/14/2014  . Edema 10/14/2014  . Depressive  disorder 10/14/2014  . Obstructive sleep apnea 10/14/2014  . Diabetes ([place]) 10/14/2014  . Essential hypertension 10/14/2014  . Epilepsy ([place]) 10/14/2014  . Migraine 10/14/2014  . Hyperlipemia 10/14/2014  . Dysthymic disorder 10/14/2014  . Seizure ([place]) 10/14/2014  . Hypercholesteremia 10/14/2014  . Palpitation 10/14/2014  . Depression 10/14/2014  . Dyspepsia 10/14/2014  . Hematochezia 10/14/2014  . Urge incontinence 10/14/2014  . Hair thinning 10/14/2014  . Dermatitis, eczematoid 09/10/2014  . Acid indigestion 09/10/2014  . Alopecia 09/10/2014  . Blood in feces 09/10/2014  . H/O hypercholesterolemia 09/10/2014  . Arthralgia of hip 09/10/2014  . Feeling bilious 09/10/2014  . Awareness of heartbeats 09/10/2014  . Pain in the wrist 09/10/2014  . Convulsions ([place]) 09/10/2014  . Urge incontinence 09/10/2014  . Snapping thumb syndrome 04/19/2014  . Diabetes mellitus ([place]) 03/14/2014  . De Quervain's disease (radial styloid tenosynovitis) 03/08/2014  . LBP (low back pain) 03/07/2013  . Mixed hyperlipidemia 01/15/2013  . Encounter for long-term (current) use of other medications - statin 01/15/2013  . Fatigue 01/15/2013  . Heart palpation 01/15/2013  . Obesity (BMI 30-39.9) 01/02/2013  . CCF (congestive cardiac failure) ([place]) 11/13/2009  . Malaise and fatigue 02/17/2009  . Sprain and strain of interphalangeal (joint) of hand 10/23/2007  . HEADACHE 06/23/2007  . DIABETES MELLITUS, TYPE II 05/08/2007  . HYPERCALCEMIA 05/08/2007  . Essential hypertension 05/08/2007  . Sleep apnea 05/08/2007  . Edema 05/08/2007  . DIARRHEA, CHRONIC 05/08/2007  . Clinical depression 10/20/2006  . Obstructive apnea 03/22/2006  . Diabetes mellitus, type 2 ([place]) 11/12/2005  . Depression, neurotic 11/12/2005  . Epilepsy ([place]) 11/12/2005  . Combined fat and carbohydrate induced hyperlipemia 11/12/2005  . Acute onset aura migraine 11/12/2005    Review of Systems  Constitutional: Negative.     HENT: Positive for congestion.   Eyes: Negative.   Respiratory: Negative.   Cardiovascular: Negative.   Gastrointestinal: Negative.   Endocrine: Negative.   Genitourinary: Negative.   Musculoskeletal: Positive for back pain.  Skin: Negative.   Allergic/Immunologic: Negative.   Neurological: Negative.   Hematological: Negative.   Psychiatric/Behavioral: Negative.     Social History  Substance Use Topics  . Smoking status: Former Smoker    Types: Cigarettes    Quit date: 01/03/1979  . Smokeless tobacco: Never Used     Comment: quit in 1984  . Alcohol Use: No   Objective:   BP 128/84 mmHg  Pulse 76  Temp(Src) 97.9 F (36.6 C) (Oral)  Resp 16  SpO2 96%  LMP  (LMP Unknown)  Physical Exam  Constitutional: She is oriented to person, place, and time. She appears well-developed and well-nourished.  HENT:  Head: Normocephalic.  Right Ear: External ear normal.  Left Ear: External ear normal.  Mild swelling of turbinates. No purulent mucus drainage.  Eyes: Conjunctivae and EOM are normal.  Neck: Normal range of motion. Neck supple.  Cardiovascular: Normal rate and regular rhythm.   Pulmonary/Chest: Effort normal and breath sounds normal.  Abdominal: Soft. Bowel sounds are normal.  Musculoskeletal: She exhibits no tenderness.  Tender right low back into sciatic notch and radiation down the right leg. Well healed scar over the lower lumbar and sacrum from past laminectomy and the fusion by Dr. Mauri Pole.  Neurological: She is alert and oriented to person, place, and time.  Unable to elicit DTR's in either lower extremity. Unable to test ROM due to acute pain with movement. Deep pressure ache and occasional spasms at rest.  Skin:  Dry eczema across dorsum of MCP joints of hands.      Assessment & Plan:       1. Low back pain radiating to right lower extremity Recent flare of back pain with radiation down the right leg. Worried about movement of screws and fusion. No specific  injury recently. Requests referral to neurosurgeon. Not a good candidate for MRI with history of metal cage fusing lumbar vertebrae. Will increase Baclofen to TID and schedule referral. - baclofen (LIORESAL) 10 MG tablet; Take 1 tablet (10 mg total) by mouth 3 (three) times daily.  Dispense: 30 each; Refill: 0 - Ambulatory referral to Neurosurgery  2. Hx of decompressive lumbar laminectomy First laminectomy was in 1995 and second in 2001 with fusion by Dr. Mauri Pole (no longer available for follow up).  3. Eczema of both hands History of hand eczema with good response from minimal use of Lidex. Recommend using it to regain control then use Eucerin to maintain moisture in skin. Recheck if no better and advised not to use the Lidex more than 10 days. - fluocinonide ointment (LIDEX) 0.05 %; Apply 1 application topically 2 (two) times daily.  Dispense: 30 g; Refill: 1

## 2015-04-10 NOTE — Patient Instructions (Signed)

## 2015-04-15 ENCOUNTER — Other Ambulatory Visit: Payer: Self-pay | Admitting: Family Medicine

## 2015-04-15 DIAGNOSIS — M5416 Radiculopathy, lumbar region: Secondary | ICD-10-CM | POA: Diagnosis not present

## 2015-04-15 DIAGNOSIS — I10 Essential (primary) hypertension: Secondary | ICD-10-CM | POA: Diagnosis not present

## 2015-04-15 DIAGNOSIS — M545 Low back pain: Secondary | ICD-10-CM | POA: Diagnosis not present

## 2015-04-15 DIAGNOSIS — Z6841 Body Mass Index (BMI) 40.0 and over, adult: Secondary | ICD-10-CM | POA: Diagnosis not present

## 2015-05-02 ENCOUNTER — Telehealth: Payer: Self-pay | Admitting: Family Medicine

## 2015-05-02 NOTE — Telephone Encounter (Signed)
Pt called saying she needs an order for deposable underwear.  She says she usually gets you write an order.  She talked to you regarding this when she was last in the office.  Husband can come pick up an order on Tuesday.  Her call back is  [phone].  Thanks, [employer]

## 2015-05-06 NOTE — Telephone Encounter (Signed)
Call order to [facility] for disposable diaper/underwear for mixed urinary incontinence.

## 2015-05-06 NOTE — Telephone Encounter (Signed)
Contacted [facility]. They will need a written RX along with DX code and how many disposable underwear per month on RX faxed to [phone].

## 2015-05-06 NOTE — Telephone Encounter (Signed)
RX faxed to [facility]. Patient is aware.

## 2015-05-06 NOTE — Telephone Encounter (Signed)
Pt would like orders for deposable underwear sent to [facility]. Thanks TNP

## 2015-05-07 ENCOUNTER — Telehealth: Payer: Self-pay | Admitting: Family Medicine

## 2015-05-07 ENCOUNTER — Other Ambulatory Visit: Payer: Self-pay | Admitting: Family Medicine

## 2015-05-07 NOTE — Telephone Encounter (Signed)
Pt stated she contacted [facility] and they advised pt they didn't receive the RX that was faxed yesterday and pt would like it faxed again today if possible. Pt stated that she only has one left. Please advise. Thanks TNP

## 2015-05-07 NOTE — Telephone Encounter (Signed)
Re faxed RX to [facility]. Patient is aware.

## 2015-05-14 ENCOUNTER — Ambulatory Visit: Payer: Self-pay | Admitting: Psychiatry

## 2015-05-14 ENCOUNTER — Other Ambulatory Visit: Payer: Self-pay

## 2015-05-14 DIAGNOSIS — F331 Major depressive disorder, recurrent, moderate: Secondary | ICD-10-CM

## 2015-05-14 MED ORDER — VILAZODONE HCL 40 MG PO TABS: 40.0000 mg | ORAL_TABLET | Freq: Every day | ORAL | Status: DC

## 2015-05-14 MED ORDER — LITHIUM CARBONATE 300 MG PO TABS: 600.0000 mg | ORAL_TABLET | Freq: Every day | ORAL | Status: DC

## 2015-05-14 NOTE — Telephone Encounter (Signed)
pt called states that she still can not get out of her home, road has not been done yet from the snow.  pt r/s appt for monday.  pt wanted to know if you would give her enough medications to last until she can come in

## 2015-05-19 ENCOUNTER — Other Ambulatory Visit
Admission: RE | Admit: 2015-05-19 | Discharge: 2015-05-19 | Disposition: A | Discharge status: Home / Self Care | Payer: Medicare Other | Source: Ambulatory Visit | Attending: Psychiatry | Admitting: Psychiatry

## 2015-05-19 ENCOUNTER — Ambulatory Visit (INDEPENDENT_AMBULATORY_CARE_PROVIDER_SITE_OTHER): Payer: 59 | Admitting: Psychiatry

## 2015-05-19 ENCOUNTER — Encounter: Payer: Self-pay | Admitting: Psychiatry

## 2015-05-19 VITALS — BP 140/86 | HR 85 | Temp 97.6°F | Ht 63.0 in | Wt 222.2 lb

## 2015-05-19 DIAGNOSIS — Z79899 Other long term (current) drug therapy: Secondary | ICD-10-CM | POA: Insufficient documentation

## 2015-05-19 DIAGNOSIS — F331 Major depressive disorder, recurrent, moderate: Secondary | ICD-10-CM

## 2015-05-19 DIAGNOSIS — F329 Major depressive disorder, single episode, unspecified: Secondary | ICD-10-CM | POA: Diagnosis not present

## 2015-05-19 LAB — BASIC METABOLIC PANEL
Anion gap: 9 (ref 5–15)
BUN: 18 mg/dL (ref 6–20)
CHLORIDE: 104 mmol/L (ref 101–111)
CO2: 26 mmol/L (ref 22–32)
CREATININE: 1.07 mg/dL — AB (ref 0.44–1.00)
Calcium: 10.7 mg/dL — ABNORMAL HIGH (ref 8.9–10.3)
GFR calc Af Amer: >60 mL/min (ref >60)
GFR calc non Af Amer: 58 mL/min — ABNORMAL LOW (ref >60)
GLUCOSE: 258 mg/dL — AB (ref 65–99)
Potassium: 4.2 mmol/L (ref 3.5–5.1)
SODIUM: 139 mmol/L (ref 135–145)

## 2015-05-19 LAB — TSH: TSH: 2.522 u[IU]/mL (ref 0.350–4.500)

## 2015-05-19 LAB — LITHIUM LEVEL: LITHIUM LVL: 0.6 mmol/L (ref 0.60–1.20)

## 2015-05-19 MED ORDER — VILAZODONE HCL 40 MG PO TABS: 40.0000 mg | ORAL_TABLET | Freq: Every day | ORAL | Status: DC

## 2015-05-19 MED ORDER — LITHIUM CARBONATE 300 MG PO TABS: 600.0000 mg | ORAL_TABLET | Freq: Every day | ORAL | Status: DC

## 2015-05-19 NOTE — Progress Notes (Addendum)
[place] MD/PA/NP OP Progress Note  05/19/2015 11:05 AM Abigail Figueroa  MRN:  [id number]  Subjective:  Patient returns for follow-up of her major depressive disorder, recurrent moderate. He presents to the point with her daughter. Patient states the biggest issue is been dealing with pain in her back. She relates they may be doing some back surgery. Patient states that aside from that stressor her mood has been fairly stable. I asked daughter whether she noticed any issues. Daughter stated that there is some frustration and sadness from her ability issues and remaining in the house. Patient has had some issue with getting to appointments and discussed with him perhaps exploring some assistance with transportation. Patient indicates that due to her daughter and husband's work schedule can be challenging to get to appointments. She states she continues to have  telephone dialogue with the therapist she saw in this clinic and ultimately plans to transition to see that therapist.  He reports overall feeling stable on the current medication regimen of viibryd and lithium.  Chief Complaint: my back Chief Complaint    Follow-up; Medication Refill    depression Visit Diagnosis:     ICD-9-CM ICD-10-CM   1. Major depressive disorder, recurrent episode, moderate (HCC) 296.32 F33.1 Vilazodone HCl (VIIBRYD) 40 MG TABS    Past Medical History:  Past Medical History  Diagnosis Date  . Edema   . Heart palpitations June 2013    Event monitor showing sinus tachycardia, PACs with couplets and triplets.  . Obesity   . Diabetes ([place])   . Hypercholesterolemia   . Seizure disorder ([place])   . Migraine   . OSA (obstructive sleep apnea)     has been on continuous positive airway pressure  . Hx of echocardiogram 11/18/2009    showed normal left ventricular function, mild left ventricular hypertrophy, and no significant valve abnormalities.  . History of stress test 11/25/2009    Normal myocardial perfusion  imaging  . Hypertension   . Dysrhythmia     palpitations  . Sleep apnea     CPAP  . Diabetes mellitus without complication ([place])   . Depression   . Anxiety   . Headache     migraines, gets botox injections    Past Surgical History  Procedure Laterality Date  . Cholecystectomy  1980s  . Appendectomy  1980s  . Abdominal hysterectomy  1990    without BSO  . Back surgery  1900s 2001    x2 with "cage put in"  . Knee surgery Right 2012    meniscus tear  . Abdominal hysterectomy  1990  . Cholecystectomy  1980  . Tubal ligation  1982  . Back surgery  2001  . Knee surgery  2012  . Laminectomy  1995  . Carpal tunnel release Bilateral   . Bilateral salpingoophorectomy  1990  . Dorsal compartment release Left 10/25/2014    Procedure: LEFT FIRST  DORSAL COMPARTMENT RELEASE AND RADIAL TENOSYNOVECTOMY ;  Surgeon: Roseanne Kaufman, MD;  Location: [facility];  Service: Orthopedics;  Laterality: Left;  . Hand surgery    . Wrist surgery Right    Family History:  Family History  Problem Relation Age of Onset  . Sarcoidosis Mother   . Seizures Mother   . Heart attack Mother   . Alzheimer's disease Maternal Grandmother   . Dementia Maternal Grandmother   . Hypertension Maternal Grandfather   . Diabetes Maternal Grandfather    Social History:  Social History   Social  History  . Marital Status: Married    Spouse Name: N/A  . Number of Children: N/A  . Years of Education: N/A   Social History Main Topics  . Smoking status: Former Smoker    Types: Cigarettes    Quit date: 01/03/1979  . Smokeless tobacco: Never Used     Comment: quit in 1984  . Alcohol Use: No  . Drug Use: No  . Sexual Activity: No   Other Topics Concern  . None   Social History Narrative   ** Merged History Encounter **       She is a married mother of 2, grandmother 3. She tries to get exercise but is not doing any routine program.   She quit smoking over 25 years ago does not drink alcohol.    Additional History:   Assessment:   Musculoskeletal: Strength & Muscle Tone: within normal limits Gait & Station: Slow and was ambulating with a walker Patient leans: N/A  Psychiatric Specialty Exam: HPI  Review of Systems  Psychiatric/Behavioral: Positive for depression (depressionis somewhat related  dealing with her back pain). Negative for suicidal ideas, hallucinations, memory loss and substance abuse. The patient is not nervous/anxious and does not have insomnia.     Blood pressure 140/86, pulse 85, temperature 97.6 F (36.4 C), temperature source Tympanic, height 5\' 3"  (1.6 m), weight 222 lb 3.2 oz (100.789 kg), SpO2 96 %.Body mass index is 39.37 kg/(m^2).  General Appearance: Well Groomed  Eye Contact:  Good  Speech:  Clear and Coherent and Normal Rate  Volume:  Normal  Mood:  Okay  Affect:  Restricted however brightened at the end of the appointment and did say thank you to this writer  Thought Process:  Linear  Orientation:  Full (Time, Place, and Person)  Thought Content:  Negative  Suicidal Thoughts:  No  Homicidal Thoughts:  No  Memory:  Immediate;   Good Recent;   Good Remote;   Good  Judgement:  Good  Insight:  Good  Psychomotor Activity:  Negative  Concentration:  Good  Recall:  Good  Fund of Knowledge: Good  Language: Good  Akathisia:  Negative  Handed:  Right  AIMS (if indicated):  Not done  Assets:  Communication Skills Desire for Improvement Social Support  ADL's:  Intact  Cognition: WNL  Sleep:  Poor, not using CPAP, daytime napping   Is the patient at risk to self?  No. Has the patient been a risk to self in the past 6 months?  No. Has the patient been a risk to self within the distant past?  No. Is the patient a risk to others?  No. Has the patient been a risk to others in the past 6 months?  No. Has the patient been a risk to others within the distant past?  No.  Current Medications: Current Outpatient Prescriptions  Medication Sig  Dispense Refill  . baclofen (LIORESAL) 10 MG tablet Take 1 tablet (10 mg total) by mouth 3 (three) times daily. 30 each 0  . betamethasone acetate-betamethasone sodium phosphate (CELESTONE) 6 (3-3) MG/ML injection Inject 3 mg into the articular space.    Marland Kitchen BOTOX 100 UNITS SOLR injection     . chlorproMAZINE (THORAZINE) 25 MG tablet Take 25 mg by mouth 3 (three) times daily.    . fluocinonide ointment (LIDEX) AB-123456789 % Apply 1 application topically 2 (two) times daily. 30 g 1  . furosemide (LASIX) 20 MG tablet Take 1 tablet (20 mg total) by mouth  daily. 30 tablet 6  . HYDROcodone-acetaminophen (NORCO) 5-325 MG tablet Take 1 tablet by mouth every 4 (four) hours as needed for moderate pain. 8 tablet 0  . lamoTRIgine (LAMICTAL) 100 MG tablet Take 100 mg by mouth 2 (two) times daily.    Marland Kitchen lithium 300 MG tablet Take 2 tablets (600 mg total) by mouth at bedtime. 60 tablet 4  . metFORMIN (GLUCOPHAGE) 500 MG tablet TAKE 2 TABLETS IN THE MORNING, 1 TABLET AT LUNCH TIME AND 2 TABLETS IN THE EVENING 150 tablet 3  . metoprolol (LOPRESSOR) 50 MG tablet Take 1 tablet (50 mg total) by mouth 2 (two) times daily. 60 tablet 6  . Metoprolol-Hydrochlorothiazide 25-12.5 MG TB24 Take by mouth.    . ondansetron (ZOFRAN) 8 MG tablet     . ONE TOUCH ULTRA TEST test strip     . TEGRETOL-XR 400 MG 12 hr tablet TAKE 1 TABLET BY MOUTH TWICE DAILY 60 tablet 4  . Vilazodone HCl (VIIBRYD) 40 MG TABS Take 1 tablet (40 mg total) by mouth daily. 30 tablet 4  . VYTORIN 10-40 MG tablet TAKE 1 TABLET BY MOUTH AT BEDTIME 30 tablet 11   No current facility-administered medications for this visit.    Medical Decision Making:  Established Problem, Stable/Improving (1) and Review or order clinical lab tests (1)  Treatment Plan Summary:Medication management   Age depressive disorder, recurrent, moderate-Continue Viibryd 40 mg daily. She'll continue on the lithium, change from 300 mg twice a day to 600 mg at bedtime. She'll follow up in 3  months.   Given her a laboratory slip at her last appointment to get lithium labs. Patient case she has not done that it will go today. I've written out another laboratory slip and strongly encourage her to get these labs done.  Of note patient does receive Lamictal and chlorpromazine and Tegretol for migraine treatment. These are prescribed by another physician.  Patient is aware of my departure and that she will follow up with another provider within this clinic. She's been encouraged call any questions or concerns prior to her next appointment.  05/22/15-Spoke with patient about her recent lithium labs. Informed her that overall they are normal. Did discuss with her that she has a slightly elevated creatinine, 1.07, with normal limits being up to 1. However following this over the past 7 months it is largely remained stable. Her GFR is only slightly decreased at 58 with the normal being 60. Her TSH was within normal limits. Her lithium level was 0.6. Given that she's reporting benefit from the lithium we're going to continue to monitor. Will make her next provider aware of this.   Faith Rogue 05/19/2015, 11:05 AM

## 2015-05-22 ENCOUNTER — Telehealth: Payer: Self-pay | Admitting: Psychiatry

## 2015-05-22 ENCOUNTER — Other Ambulatory Visit: Payer: Self-pay | Admitting: Neurosurgery

## 2015-05-22 DIAGNOSIS — M5416 Radiculopathy, lumbar region: Secondary | ICD-10-CM

## 2015-05-22 NOTE — Telephone Encounter (Signed)
Spoke with patient about her recent lithium labs. Informed her that overall they are normal. Did discuss with her that she has a slightly elevated creatinine, 1.07, with normal limits being up to 1. However following this over the past 7 months it is largely remained stable. Her GFR is only slightly decreased at 58 with the normal being 60. Her TSH was within normal limits. Her lithium level was 0.6. Given that she's reporting benefit from the lithium we're going to continue to monitor. Will make her next provider aware of this.

## 2015-05-30 ENCOUNTER — Ambulatory Visit
Admission: RE | Admit: 2015-05-30 | Discharge: 2015-05-30 | Disposition: A | Discharge status: Home / Self Care | Payer: Medicare Other | Source: Ambulatory Visit | Attending: Neurosurgery | Admitting: Neurosurgery

## 2015-05-30 DIAGNOSIS — M5416 Radiculopathy, lumbar region: Secondary | ICD-10-CM

## 2015-05-30 DIAGNOSIS — M4806 Spinal stenosis, lumbar region: Secondary | ICD-10-CM | POA: Diagnosis not present

## 2015-05-30 MED ORDER — GADOBENATE DIMEGLUMINE 529 MG/ML IV SOLN
20.0000 mL | Freq: Once | INTRAVENOUS | Status: AC | PRN
  Administered 2015-05-30: 20 mL via INTRAVENOUS

## 2015-06-03 ENCOUNTER — Telehealth: Payer: Self-pay | Admitting: Psychiatry

## 2015-06-03 NOTE — Telephone Encounter (Signed)
Received a message that patient reported her lithium was not helping her. Called to discuss the issue with patient. There was no answer and left a voicemail message. AW

## 2015-06-06 ENCOUNTER — Telehealth: Payer: Self-pay | Admitting: Psychiatry

## 2015-06-06 NOTE — Telephone Encounter (Signed)
Patient contacted the office on 06/03/15 indicating she felt her lithium was not working. I contacted her at that time and left a voicemail but did not get any response. I contacted her again today 06/06/2015 and of left a voicemail.

## 2015-06-10 ENCOUNTER — Telehealth: Payer: Self-pay | Admitting: Psychiatry

## 2015-06-10 NOTE — Telephone Encounter (Signed)
Attempted to return patient's phone call again as she had contacted the clinic indicating she felt her lithium was not working.  After a message and my contact information. AW

## 2015-06-26 DIAGNOSIS — M4806 Spinal stenosis, lumbar region: Secondary | ICD-10-CM | POA: Diagnosis not present

## 2015-06-26 DIAGNOSIS — M1288 Other specific arthropathies, not elsewhere classified, other specified site: Secondary | ICD-10-CM | POA: Diagnosis not present

## 2015-06-26 DIAGNOSIS — M4316 Spondylolisthesis, lumbar region: Secondary | ICD-10-CM | POA: Diagnosis not present

## 2015-06-26 DIAGNOSIS — M5416 Radiculopathy, lumbar region: Secondary | ICD-10-CM | POA: Diagnosis not present

## 2015-06-30 ENCOUNTER — Other Ambulatory Visit: Payer: Self-pay | Admitting: Neurosurgery

## 2015-07-01 DIAGNOSIS — M4316 Spondylolisthesis, lumbar region: Secondary | ICD-10-CM | POA: Diagnosis not present

## 2015-07-02 DIAGNOSIS — G43719 Chronic migraine without aura, intractable, without status migrainosus: Secondary | ICD-10-CM | POA: Diagnosis not present

## 2015-07-18 NOTE — Pre-Procedure Instructions (Signed)
    Abigail Figueroa  07/18/2015      [id number] [address], [place] [address] Phone: [phone] Fax: [phone]    Your procedure is scheduled on 07/28/15.  Report to [facility] Admitting at 10 A.M.  Call this number if you have problems the morning of surgery:  [phone]   Remember:  Do not eat food or drink liquids after midnight.  Take these medicines the morning of surgery with A SIP OF WATER --hydrocodone,lithium,metroprolol,tegretol   Do not wear jewelry, make-up or nail polish.  Do not wear lotions, powders, or perfumes.  You may wear deodorant.  Do not shave 48 hours prior to surgery.  Men may shave face and neck.  Do not bring valuables to the hospital.  [facility] is not responsible for any belongings or valuables.  Contacts, dentures or bridgework may not be worn into surgery.  Leave your suitcase in the car.  After surgery it may be brought to your room.  For patients admitted to the hospital, discharge time will be determined by your treatment team.  Patients discharged the day of surgery will not be allowed to drive home.   Name and phone number of your driver:    Special instructions:    Please read over the following fact sheets that you were given. Pain Booklet, Coughing and Deep Breathing, MRSA Information and Surgical Site Infection Prevention

## 2015-07-21 ENCOUNTER — Encounter (HOSPITAL_COMMUNITY)
Admission: RE | Admit: 2015-07-21 | Discharge: 2015-07-21 | Disposition: A | Discharge status: Home / Self Care | Payer: Medicare Other | Source: Ambulatory Visit | Attending: Neurosurgery | Admitting: Neurosurgery

## 2015-07-21 ENCOUNTER — Telehealth: Payer: Self-pay | Admitting: *Deleted

## 2015-07-21 ENCOUNTER — Encounter (HOSPITAL_COMMUNITY): Payer: Self-pay

## 2015-07-21 DIAGNOSIS — Z01810 Encounter for preprocedural cardiovascular examination: Secondary | ICD-10-CM | POA: Insufficient documentation

## 2015-07-21 DIAGNOSIS — R002 Palpitations: Secondary | ICD-10-CM | POA: Diagnosis not present

## 2015-07-21 DIAGNOSIS — M4316 Spondylolisthesis, lumbar region: Secondary | ICD-10-CM | POA: Diagnosis not present

## 2015-07-21 DIAGNOSIS — E119 Type 2 diabetes mellitus without complications: Secondary | ICD-10-CM | POA: Insufficient documentation

## 2015-07-21 DIAGNOSIS — R9439 Abnormal result of other cardiovascular function study: Secondary | ICD-10-CM | POA: Diagnosis not present

## 2015-07-21 DIAGNOSIS — I10 Essential (primary) hypertension: Secondary | ICD-10-CM | POA: Diagnosis not present

## 2015-07-21 DIAGNOSIS — Z0183 Encounter for blood typing: Secondary | ICD-10-CM | POA: Diagnosis not present

## 2015-07-21 DIAGNOSIS — Z01812 Encounter for preprocedural laboratory examination: Secondary | ICD-10-CM | POA: Insufficient documentation

## 2015-07-21 HISTORY — DX: Unspecified osteoarthritis, unspecified site: M19.90

## 2015-07-21 HISTORY — DX: Anemia, unspecified: D64.9

## 2015-07-21 LAB — BASIC METABOLIC PANEL
ANION GAP: 10 (ref 5–15)
BUN: 17 mg/dL (ref 6–20)
CALCIUM: 10.2 mg/dL (ref 8.9–10.3)
CHLORIDE: 109 mmol/L (ref 101–111)
CO2: 24 mmol/L (ref 22–32)
Creatinine, Ser: 1.16 mg/dL — ABNORMAL HIGH (ref 0.44–1.00)
GFR calc Af Amer: >60 mL/min (ref >60)
GFR calc non Af Amer: 52 mL/min — ABNORMAL LOW (ref >60)
GLUCOSE: 238 mg/dL — AB (ref 65–99)
Potassium: 4.3 mmol/L (ref 3.5–5.1)
Sodium: 143 mmol/L (ref 135–145)

## 2015-07-21 LAB — GLUCOSE, CAPILLARY: Glucose-Capillary: 221 mg/dL — ABNORMAL HIGH (ref 65–99)

## 2015-07-21 LAB — ABO/RH: ABO/RH(D): A POS

## 2015-07-21 LAB — TYPE AND SCREEN
ABO/RH(D): A POS
Antibody Screen: NEGATIVE

## 2015-07-21 LAB — CBC
HEMATOCRIT: 35 % — AB (ref 36.0–46.0)
HEMOGLOBIN: 11.1 g/dL — AB (ref 12.0–15.0)
MCH: 27.5 pg (ref 26.0–34.0)
MCHC: 31.7 g/dL (ref 30.0–36.0)
MCV: 86.8 fL (ref 78.0–100.0)
Platelets: 244 10*3/uL (ref 150–400)
RBC: 4.03 MIL/uL (ref 3.87–5.11)
RDW: 15.7 % — AB (ref 11.5–15.5)
WBC: 6.2 10*3/uL (ref 4.0–10.5)

## 2015-07-21 LAB — SURGICAL PCR SCREEN
MRSA, PCR: NEGATIVE
STAPHYLOCOCCUS AUREUS: NEGATIVE

## 2015-07-21 NOTE — Telephone Encounter (Signed)
OK to have APP see her. Dr. Ellyn Hack is out for the week and I am rounding this week.

## 2015-07-21 NOTE — Telephone Encounter (Signed)
Request for surgical clearance:  1. What type of surgery is being performed? Spine surgery - slipped disc repair  - L3-5 "cages" (plates & screws)  2. When is this surgery scheduled? Mon Mar 27th  3. Are there any medications that need to be held prior to surgery and how long?   4. Name of physician performing surgery? Dr. Arnoldo Morale  5. What is your office phone and fax number? Phone: Roseanne Kaufman, surg scheduler [phone] ex [phone]    Spoke to patient - seen as walk-in.  She came in for cardiac clearance request. Notes she was seen for pre-op visit today - Dr. Arnoldo Morale' office requested she call to see if clearance required.  She has seen Dr. Claiborne Billings for sleep issues. Dr. Ellyn Hack primary cardiologist - overdue to be seen. Dr. Ellyn Hack out of office this week.  Pt aware I will send msg - see if OK for clearance by APP this week.   Pt has given best number to reach her [phone] Or call husband Nicole Kindred [phone]

## 2015-07-22 ENCOUNTER — Ambulatory Visit: Payer: Medicare Other | Admitting: Physician Assistant

## 2015-07-22 ENCOUNTER — Encounter: Payer: Self-pay | Admitting: Physician Assistant

## 2015-07-22 ENCOUNTER — Ambulatory Visit (INDEPENDENT_AMBULATORY_CARE_PROVIDER_SITE_OTHER): Payer: Medicare Other | Admitting: Physician Assistant

## 2015-07-22 VITALS — BP 160/98 | HR 80 | Ht 63.0 in | Wt 223.0 lb

## 2015-07-22 DIAGNOSIS — Z01810 Encounter for preprocedural cardiovascular examination: Secondary | ICD-10-CM | POA: Diagnosis not present

## 2015-07-22 DIAGNOSIS — I10 Essential (primary) hypertension: Secondary | ICD-10-CM

## 2015-07-22 DIAGNOSIS — E669 Obesity, unspecified: Secondary | ICD-10-CM

## 2015-07-22 DIAGNOSIS — E7800 Pure hypercholesterolemia, unspecified: Secondary | ICD-10-CM

## 2015-07-22 DIAGNOSIS — R600 Localized edema: Secondary | ICD-10-CM | POA: Insufficient documentation

## 2015-07-22 DIAGNOSIS — Z01818 Encounter for other preprocedural examination: Secondary | ICD-10-CM | POA: Diagnosis not present

## 2015-07-22 DIAGNOSIS — E1169 Type 2 diabetes mellitus with other specified complication: Secondary | ICD-10-CM

## 2015-07-22 DIAGNOSIS — E119 Type 2 diabetes mellitus without complications: Secondary | ICD-10-CM

## 2015-07-22 DIAGNOSIS — G4733 Obstructive sleep apnea (adult) (pediatric): Secondary | ICD-10-CM

## 2015-07-22 LAB — HEMOGLOBIN A1C
HEMOGLOBIN A1C: 9.2 % — AB (ref 4.8–5.6)
MEAN PLASMA GLUCOSE: 217 mg/dL

## 2015-07-22 MED ORDER — POTASSIUM CHLORIDE ER 10 MEQ PO CPCR: 10.0000 meq | ORAL_CAPSULE | Freq: Every day | ORAL | Status: DC | PRN

## 2015-07-22 MED ORDER — FUROSEMIDE 20 MG PO TABS: 20.0000 mg | ORAL_TABLET | Freq: Every day | ORAL | Status: DC | PRN

## 2015-07-22 NOTE — Telephone Encounter (Signed)
Patient prefer to see Dr Claiborne Billings as primary cardiologist and sleep

## 2015-07-22 NOTE — Patient Instructions (Signed)
Medication Instructions: Abigail Fuller, PA-C, has recommended making the following medication changes: 1. START Furosemide (Lasix) 20 mg - take 1 tablet by mouth daily for 3 days then only use when needed. 2. START Potassium 10 mEq - take 1 capsule by mouth daily for 3 days then only used when needed.  Weigh daily. Take Furosemide and potassium together if your weight climbs more than 3 pounds in a day or 5 pounds in a week. No salt to very little salt in your diet.  No more than 2000 mg in a day. Call if increased shortness of breath or increased swelling.  Labwork: NONE  Testing/Procedures: 1. [place] physician has requested that you have a lexiscan myoview this week. For further information please visit [email or web address]. Please follow instruction sheet, as given.  Follow-up: Abigail Figueroa recommends that you schedule a follow-up appointment in 3 months with Abigail Figueroa.  If you need a refill on your cardiac medications before your next appointment, please call your pharmacy.

## 2015-07-22 NOTE — Telephone Encounter (Signed)
Ok if ok with Dr. Ellyn Hack

## 2015-07-22 NOTE — Telephone Encounter (Signed)
Note patient no longer sees Dr. Ellyn Hack, this was missed in discussion yesterday. She prefers to be pt of Dr. Claiborne Billings now.

## 2015-07-22 NOTE — Progress Notes (Signed)
Patient ID: Abigail Figueroa, female   DOB: 04-17-1961, 55 y.o.   MRN: [id number]    Date:  07/22/2015   ID:  Abigail Figueroa, DOB 1960-09-10, MRN [id number]  PCP:  Vernie Murders, PA  Primary Cardiologist:  Claiborne Billings   Chief Complaint  Patient presents with  . Surgical Clearance    Back surgery, Reallignment of 4 and 5     History of Present Illness: Abigail Figueroa is a 55 y.o. female  History of obstructive sleep apnea syndrome , diabetes mellitus, hypercholesterolemia.   Last stress test was July 2011 and was low risk.  She's been having problems with her lower back since May 26, 2023 and requires lumbar fusion which is scheduled for the 27th.  She is here for preoperative clearance. She reports that she stopped the Lasix because it made her have to go to the bathroom so frequently.  Getting up and down was causing severe back pain. She currently denies any orthopnea or PND but does have lower extremity edema bilaterally. She reports her blood pressures at home has been running about 147/85.  Family history significant for myocardial infarction with her mother at age 38, at which time she died.  Prior 05/26/2023 she was walking with her dog for 30-40 minutes a day.  The patient currently denies nausea, vomiting, fever, chest pain, shortness of breath, dizziness, PND, cough, congestion, abdominal pain, hematochezia, melena, claudication.  Wt Readings from Last 3 Encounters:  07/22/15 223 lb (101.152 kg)  07/21/15 225 lb 14.4 oz (102.468 kg)  05/19/15 222 lb 3.2 oz (100.789 kg)     Past Medical History  Diagnosis Date  . Edema   . Heart palpitations June 2013    Event monitor showing sinus tachycardia, PACs with couplets and triplets.  . Obesity   . Diabetes ([place])   . Hypercholesterolemia   . Seizure disorder ([place])   . Migraine   . OSA (obstructive sleep apnea)     has been on continuous positive airway pressure  . Hx of echocardiogram 11/18/2009    showed normal left ventricular  function, mild left ventricular hypertrophy, and no significant valve abnormalities.  . History of stress test 11/25/2009    Normal myocardial perfusion imaging  . Hypertension   . Dysrhythmia     palpitations  . Sleep apnea     CPAP  . Diabetes mellitus without complication ([place])   . Depression   . Anxiety   . Headache     migraines, gets botox injections  . Anemia   . Arthritis     Current Outpatient Prescriptions  Medication Sig Dispense Refill  . baclofen (LIORESAL) 10 MG tablet Take 1 tablet (10 mg total) by mouth 3 (three) times daily. (Patient taking differently: Take 10 mg by mouth 3 (three) times daily as needed (Headaches). ) 30 each 0  . BOTOX 100 UNITS SOLR injection Inject into the muscle every 3 (three) months.     . chlorproMAZINE (THORAZINE) 25 MG tablet Take 25 mg by mouth 3 (three) times daily as needed (migraines).     . fluocinonide ointment (LIDEX) AB-[id number] % Apply 1 application topically 2 (two) times daily. (Patient taking differently: Apply 1 application topically 2 (two) times daily as needed. ) 30 g 1  . HYDROcodone-acetaminophen (NORCO/VICODIN) 5-325 MG tablet Take 1 tablet by mouth every 6 (six) hours as needed for moderate pain.    Marland Kitchen lamoTRIgine (LAMICTAL) 100 MG tablet Take 100 mg by mouth 2 (two) times daily.    Marland Kitchen  lithium 300 MG tablet Take 2 tablets (600 mg total) by mouth at bedtime. 60 tablet 4  . metFORMIN (GLUCOPHAGE) 500 MG tablet TAKE 2 TABLETS IN THE MORNING, 1 TABLET AT LUNCH TIME AND 2 TABLETS IN THE EVENING 150 tablet 3  . metoprolol (LOPRESSOR) 50 MG tablet Take 1 tablet (50 mg total) by mouth 2 (two) times daily. 60 tablet 6  . TEGRETOL-XR 400 MG 12 hr tablet TAKE 1 TABLET BY MOUTH TWICE DAILY 60 tablet 4  . Vilazodone HCl (VIIBRYD) 40 MG TABS Take 1 tablet (40 mg total) by mouth daily. 30 tablet 4  . VYTORIN 10-40 MG tablet TAKE 1 TABLET BY MOUTH AT BEDTIME 30 tablet 11  . furosemide (LASIX) 20 MG tablet Take 1 tablet (20 mg total) by mouth  daily as needed for fluid or edema. 30 tablet 5  . potassium chloride (MICRO-K) 10 MEQ CR capsule Take 1 capsule (10 mEq total) by mouth daily as needed. 30 capsule 5   No current facility-administered medications for this visit.    Allergies:    Allergies  Allergen Reactions  . Morphine And Related Hives    Nausea and vomiting   . Oxycontin [Oxycodone Hcl] Swelling, Hives and Itching    Social History:  The patient  reports that she quit smoking about 36 years ago. Her smoking use included Cigarettes. She has never used smokeless tobacco. She reports that she does not drink alcohol or use illicit drugs.   Family history:   Family History  Problem Relation Age of Onset  . Sarcoidosis Mother   . Seizures Mother   . Heart attack Mother   . Alzheimer's disease Maternal Grandmother   . Dementia Maternal Grandmother   . Hypertension Maternal Grandfather   . Diabetes Maternal Grandfather     ROS:  Please see the history of present illness.  All other systems reviewed and negative.   PHYSICAL EXAM: VS:  BP 160/98 mmHg  Pulse 80  Ht 5\' 3"  (1.6 m)  Wt 223 lb (101.152 kg)  BMI 39.51 kg/m2  LMP  (LMP Unknown) Obese, well developed, in no acute distress HEENT: Pupils are equal round react to light accommodation extraocular movements are intact.  Neck: no JVDNo cervical lymphadenopathy. Cardiac: Regular rate and rhythm without murmurs rubs or gallops. Lungs:  clear to auscultation bilaterally, no wheezing, rhonchi or rales Abd: soft, nontender, positive bowel sounds all quadrants, no hepatosplenomegaly Ext: 1+ bilateral lower extremity edema.  2+ radial and dorsalis pedis pulses. Skin: warm and dry Neuro:  Grossly normal  EKG:  Normal sinus rhythm rate 72 bpm    ASSESSMENT AND PLAN:  Problem List Items Addressed This Visit    Preoperative clearance   Obstructive sleep apnea   Lower extremity edema   Hypercholesteremia   Relevant Medications   furosemide (LASIX) 20 MG  tablet   Essential hypertension   Relevant Medications   furosemide (LASIX) 20 MG tablet   Diabetes mellitus type 2 in obese [facility])    Other Visit Diagnoses    Encounter for pre-operative cardiovascular clearance    -  Primary    Relevant Orders    EKG 12-Lead    Myocardial Perfusion Imaging      We will assess Abigail Figueroa's preoperative cardiac risk with a Lexiscan Cardiolite. Cardiac risk factors include obesity, hypertension, hyperlipidemia and diabetes as well as family history of heart disease.   We'll try to get this done before Friday cannot do it [facility] or [facility]  offices, we will do it at the hospital.  She does have a fair amount lower extremity edema is of pain because She is given. Vascular he start 20 mg daily next 3 days and resume as needed along with 10 to potassium. She should monitor blood pressure and weight. However, if her blood pressure does not improve below 130/80, she consider adding amlodipine 5 mg daily.. Continue Vytorin  For lipids.

## 2015-07-22 NOTE — Telephone Encounter (Signed)
appt scheduled today w/ Gaspar Bidding at 3:30 at [facility] office. Appt details communicated to patient.

## 2015-07-23 ENCOUNTER — Telehealth (HOSPITAL_COMMUNITY): Payer: Self-pay | Admitting: *Deleted

## 2015-07-23 NOTE — Telephone Encounter (Signed)
Left message on voicemail per DPR in reference to upcoming appointment scheduled on 07/24/15 with detailed instructions given per Myocardial Perfusion Study Information Sheet for the test. LM to arrive 15 minutes early, and that it is imperative to arrive on time for appointment to keep from having the test rescheduled. If you need to cancel or reschedule your appointment, please call the office within 24 hours of your appointment. Failure to do so may result in a cancellation of your appointment, and a $50 no show fee. Phone number given for call back for any questions.  Hubbard Robinson, RN

## 2015-07-24 ENCOUNTER — Ambulatory Visit (HOSPITAL_BASED_OUTPATIENT_CLINIC_OR_DEPARTMENT_OTHER): Payer: Medicare Other

## 2015-07-24 DIAGNOSIS — R002 Palpitations: Secondary | ICD-10-CM | POA: Diagnosis not present

## 2015-07-24 DIAGNOSIS — Z01810 Encounter for preprocedural cardiovascular examination: Secondary | ICD-10-CM | POA: Diagnosis not present

## 2015-07-24 DIAGNOSIS — R9439 Abnormal result of other cardiovascular function study: Secondary | ICD-10-CM | POA: Diagnosis not present

## 2015-07-24 DIAGNOSIS — E119 Type 2 diabetes mellitus without complications: Secondary | ICD-10-CM | POA: Diagnosis not present

## 2015-07-24 DIAGNOSIS — Z0183 Encounter for blood typing: Secondary | ICD-10-CM | POA: Diagnosis not present

## 2015-07-24 DIAGNOSIS — M4316 Spondylolisthesis, lumbar region: Secondary | ICD-10-CM | POA: Diagnosis not present

## 2015-07-24 DIAGNOSIS — Z01812 Encounter for preprocedural laboratory examination: Secondary | ICD-10-CM | POA: Diagnosis not present

## 2015-07-24 DIAGNOSIS — I10 Essential (primary) hypertension: Secondary | ICD-10-CM | POA: Diagnosis not present

## 2015-07-24 MED ORDER — TECHNETIUM TC 99M SESTAMIBI GENERIC - CARDIOLITE
33.0000 | Freq: Once | INTRAVENOUS | Status: AC | PRN
  Administered 2015-07-24: 33 via INTRAVENOUS

## 2015-07-24 MED ORDER — REGADENOSON 0.4 MG/5ML IV SOLN
0.4000 mg | Freq: Once | INTRAVENOUS | Status: AC
  Administered 2015-07-24: 0.4 mg via INTRAVENOUS

## 2015-07-25 ENCOUNTER — Ambulatory Visit (HOSPITAL_COMMUNITY): Payer: Medicare Other

## 2015-07-25 LAB — MYOCARDIAL PERFUSION IMAGING
CHL CUP NUCLEAR SSS: 5
CHL CUP RESTING HR STRESS: 69 {beats}/min
LV dias vol: 91 mL (ref 46–106)
LVSYSVOL: 40 mL
Peak HR: 100 {beats}/min
RATE: 0.23
SDS: 5
SRS: 0
TID: 0.99

## 2015-07-25 MED ORDER — TECHNETIUM TC 99M SESTAMIBI GENERIC - CARDIOLITE
30.9000 | Freq: Once | INTRAVENOUS | Status: AC | PRN
  Administered 2015-07-25: 30.9 via INTRAVENOUS

## 2015-07-27 MED ORDER — DEXTROSE 5 % IV SOLN
2.0000 g | INTRAVENOUS | Status: DC
  Filled 2015-07-27: qty 20

## 2015-07-28 ENCOUNTER — Ambulatory Visit (HOSPITAL_COMMUNITY)
Admission: RE | Admit: 2015-07-28 | Discharge: 2015-07-28 | Disposition: A | Discharge status: Home / Self Care | Payer: Medicare Other | Source: Ambulatory Visit | Attending: Neurosurgery | Admitting: Neurosurgery

## 2015-07-28 ENCOUNTER — Encounter (HOSPITAL_COMMUNITY): Payer: Self-pay | Admitting: Anesthesiology

## 2015-07-28 ENCOUNTER — Encounter (HOSPITAL_COMMUNITY)
Admission: RE | Disposition: A | Discharge status: Home / Self Care | Payer: Self-pay | Source: Ambulatory Visit | Attending: Neurosurgery

## 2015-07-28 DIAGNOSIS — Z8249 Family history of ischemic heart disease and other diseases of the circulatory system: Secondary | ICD-10-CM | POA: Insufficient documentation

## 2015-07-28 DIAGNOSIS — Z538 Procedure and treatment not carried out for other reasons: Secondary | ICD-10-CM | POA: Insufficient documentation

## 2015-07-28 DIAGNOSIS — E119 Type 2 diabetes mellitus without complications: Secondary | ICD-10-CM | POA: Insufficient documentation

## 2015-07-28 DIAGNOSIS — G4733 Obstructive sleep apnea (adult) (pediatric): Secondary | ICD-10-CM | POA: Diagnosis not present

## 2015-07-28 DIAGNOSIS — I10 Essential (primary) hypertension: Secondary | ICD-10-CM | POA: Insufficient documentation

## 2015-07-28 DIAGNOSIS — E7800 Pure hypercholesterolemia, unspecified: Secondary | ICD-10-CM | POA: Diagnosis not present

## 2015-07-28 DIAGNOSIS — M4316 Spondylolisthesis, lumbar region: Secondary | ICD-10-CM | POA: Diagnosis not present

## 2015-07-28 LAB — GLUCOSE, CAPILLARY: Glucose-Capillary: 161 mg/dL — ABNORMAL HIGH (ref 65–99)

## 2015-07-28 SURGERY — CANCELLED PROCEDURE

## 2015-07-28 MED ORDER — LIDOCAINE HCL (CARDIAC) 20 MG/ML IV SOLN
INTRAVENOUS | Status: AC
  Filled 2015-07-28: qty 5

## 2015-07-28 MED ORDER — FENTANYL CITRATE (PF) 250 MCG/5ML IJ SOLN
INTRAMUSCULAR | Status: AC
  Filled 2015-07-28: qty 5

## 2015-07-28 MED ORDER — DEXAMETHASONE SODIUM PHOSPHATE 4 MG/ML IJ SOLN
INTRAMUSCULAR | Status: AC
  Filled 2015-07-28: qty 2

## 2015-07-28 MED ORDER — ROCURONIUM BROMIDE 50 MG/5ML IV SOLN
INTRAVENOUS | Status: AC
  Filled 2015-07-28: qty 1

## 2015-07-28 MED ORDER — ONDANSETRON HCL 4 MG/2ML IJ SOLN
INTRAMUSCULAR | Status: AC
  Filled 2015-07-28: qty 2

## 2015-07-28 MED ORDER — MIDAZOLAM HCL 2 MG/2ML IJ SOLN
INTRAMUSCULAR | Status: AC
  Filled 2015-07-28: qty 2

## 2015-07-28 MED ORDER — LACTATED RINGERS IV SOLN
INTRAVENOUS | Status: DC
  Administered 2015-07-28: 11:00:00 via INTRAVENOUS

## 2015-07-28 MED ORDER — PROPOFOL 10 MG/ML IV BOLUS
INTRAVENOUS | Status: AC
  Filled 2015-07-28: qty 20

## 2015-07-28 NOTE — Consult Note (Signed)
Anesthesiology Note:  Abigail Figueroa is a 55 year old female with a history of type 2 diabetes,hypertension, obstructive sleep apnea, hypercholesterolemia, and a strong family history of coronary artery disease. She last had a stress test in July, 2011 and is now scheduled to undergo lumbar decompression and fusion by Dr. Arnoldo Morale. She was seen by in the [facility] office on 3/21 for cardiac clearance and a Lexiscan nuclear stress test was performed on 07/24/2015. The results showed an ejection fraction of 56% and findings consistent with ischemia with reversible perfusion defects in the mid anterior wall, apical anterior wall, and apex. These findings were not present on her previous nuclear stress test in 2011.   After discussions with Dr. Shelva Majestic it was decided that the most prudent course is to cancel her surgery for today and refer her to [facility] for probable cardiac catheterization. She  is scheduled to to see Orlie Pollen PA in the [place] office at 8:30 on July 30 2015.  Roberts Gaudy

## 2015-07-30 ENCOUNTER — Ambulatory Visit (INDEPENDENT_AMBULATORY_CARE_PROVIDER_SITE_OTHER): Payer: Medicare Other | Admitting: Physician Assistant

## 2015-07-30 ENCOUNTER — Encounter: Payer: Self-pay | Admitting: Physician Assistant

## 2015-07-30 ENCOUNTER — Ambulatory Visit
Admission: RE | Admit: 2015-07-30 | Discharge: 2015-07-30 | Disposition: A | Discharge status: Home / Self Care | Payer: Medicare Other | Source: Ambulatory Visit | Attending: Physician Assistant | Admitting: Physician Assistant

## 2015-07-30 VITALS — BP 120/80 | HR 60 | Ht 63.0 in | Wt 221.0 lb

## 2015-07-30 DIAGNOSIS — E669 Obesity, unspecified: Secondary | ICD-10-CM

## 2015-07-30 DIAGNOSIS — E1169 Type 2 diabetes mellitus with other specified complication: Secondary | ICD-10-CM

## 2015-07-30 DIAGNOSIS — E782 Mixed hyperlipidemia: Secondary | ICD-10-CM

## 2015-07-30 DIAGNOSIS — R931 Abnormal findings on diagnostic imaging of heart and coronary circulation: Secondary | ICD-10-CM

## 2015-07-30 DIAGNOSIS — R9439 Abnormal result of other cardiovascular function study: Secondary | ICD-10-CM | POA: Diagnosis not present

## 2015-07-30 DIAGNOSIS — E119 Type 2 diabetes mellitus without complications: Secondary | ICD-10-CM | POA: Diagnosis not present

## 2015-07-30 LAB — CBC WITH DIFFERENTIAL/PLATELET
Basophils Absolute: 0.1 10*3/uL (ref 0.0–0.1)
Basophils Relative: 1 % (ref 0–1)
Eosinophils Absolute: 0.2 10*3/uL (ref 0.0–0.7)
Eosinophils Relative: 3 % (ref 0–5)
HCT: 35 % — ABNORMAL LOW (ref 36.0–46.0)
Hemoglobin: 11.5 g/dL — ABNORMAL LOW (ref 12.0–15.0)
Lymphocytes Relative: 32 % (ref 12–46)
Lymphs Abs: 2 10*3/uL (ref 0.7–4.0)
MCH: 28.4 pg (ref 26.0–34.0)
MCHC: 32.9 g/dL (ref 30.0–36.0)
MCV: 86.4 fL (ref 78.0–100.0)
MPV: 9.8 fL (ref 8.6–12.4)
Monocytes Absolute: 0.3 10*3/uL (ref 0.1–1.0)
Monocytes Relative: 5 % (ref 3–12)
Neutro Abs: 3.8 10*3/uL (ref 1.7–7.7)
Neutrophils Relative %: 59 % (ref 43–77)
Platelets: 269 10*3/uL (ref 150–400)
RBC: 4.05 MIL/uL (ref 3.87–5.11)
RDW: 15.7 % — ABNORMAL HIGH (ref 11.5–15.5)
WBC: 6.4 10*3/uL (ref 4.0–10.5)

## 2015-07-30 LAB — BASIC METABOLIC PANEL
BUN: 18 mg/dL (ref 7–25)
CALCIUM: 10.2 mg/dL (ref 8.6–10.4)
CHLORIDE: 106 mmol/L (ref 98–110)
CO2: 28 mmol/L (ref 20–31)
CREATININE: 1.01 mg/dL (ref 0.50–1.05)
Glucose, Bld: 185 mg/dL — ABNORMAL HIGH (ref 65–99)
Potassium: 4.4 mmol/L (ref 3.5–5.3)
Sodium: 142 mmol/L (ref 135–146)

## 2015-07-30 NOTE — Patient Instructions (Signed)
Continue Lasix  Cardiac Cath scheduled 08/06/15  Follow instructions given

## 2015-07-30 NOTE — Progress Notes (Signed)
Patient ID: Abigail Figueroa, female   DOB: 1960/07/02, 55 y.o.   MRN: [id number]    Date:  07/30/2015   ID:  Abigail Figueroa, DOB March 24, 1961, MRN [id number]  PCP:  Vernie Murders, PA  Primary Cardiologist:  Claiborne Billings  Chief Complaint  Patient presents with  . Follow-up    Stress test     History of Present Illness: Abigail Figueroa is a 55 y.o. femalewith a history of obstructive sleep apnea syndrome, diabetes mellitus, hypercholesterolemia. Last stress test was July 2011 and was low risk. She's been having problems with her lower back since May 14, 2023 and requires lumbar fusion which is scheduled for the 27th.    I saw Abigail Figueroa on March 21 for preoperative clearance.  At that time she reported that stopping the Lasix because it made her have to go to the bathroom so frequently. Getting up and down was causing severe back pain. She denied any orthopnea or PND but does have lower extremity edema bilaterally. She reports her blood pressures at home has been running about 147/85. Family history significant for myocardial infarction with her mother at age 73, at which time she died. Prior 14-May-2023 she was walking with her dog for 30-40 minutes a day.   she underwent nuclear stress testing which was intermediate risk with a medium defect of moderate severity in the mid anterior apical anterior apex location there was mild hypokinesis of the distal inferior wall segment. T-wave inversions were noted II, III, aVF V5 and 6.   she is here today to discuss left heart catheterization.  The patient currently denies nausea, vomiting, fever, chest pain, shortness of breath, orthopnea, dizziness, PND, cough, congestion, abdominal pain, hematochezia, melena, lower extremity edema, claudication.  Wt Readings from Last 3 Encounters:  07/30/15 221 lb (100.245 kg)  07/28/15 223 lb (101.152 kg)  07/24/15 223 lb (101.152 kg)     Past Medical History  Diagnosis Date  . Edema   . Heart palpitations  June 2013    Event monitor showing sinus tachycardia, PACs with couplets and triplets.  . Obesity   . Diabetes ([place])   . Hypercholesterolemia   . Seizure disorder ([place])   . Migraine   . OSA (obstructive sleep apnea)     has been on continuous positive airway pressure  . Hx of echocardiogram 11/18/2009    showed normal left ventricular function, mild left ventricular hypertrophy, and no significant valve abnormalities.  . History of stress test 11/25/2009    Normal myocardial perfusion imaging  . Hypertension   . Dysrhythmia     palpitations  . Sleep apnea     CPAP  . Diabetes mellitus without complication ([place])   . Depression   . Anxiety   . Headache     migraines, gets botox injections  . Anemia   . Arthritis     Current Outpatient Prescriptions  Medication Sig Dispense Refill  . baclofen (LIORESAL) 10 MG tablet Take 1 tablet (10 mg total) by mouth 3 (three) times daily. (Patient taking differently: Take 10 mg by mouth 3 (three) times daily as needed (Headaches). ) 30 each 0  . BOTOX 100 UNITS SOLR injection Inject into the muscle every 3 (three) months.     . chlorproMAZINE (THORAZINE) 25 MG tablet Take 25 mg by mouth 3 (three) times daily as needed (migraines).     . fluocinonide ointment (LIDEX) [id number] % Apply 1 application topically 2 (two) times daily. (Patient taking differently: Apply 1 application topically  2 (two) times daily as needed. ) 30 g 1  . furosemide (LASIX) 20 MG tablet Take 1 tablet (20 mg total) by mouth daily as needed for fluid or edema. 30 tablet 5  . HYDROcodone-acetaminophen (NORCO/VICODIN) 5-325 MG tablet Take 1 tablet by mouth every 6 (six) hours as needed for moderate pain.    Marland Kitchen lamoTRIgine (LAMICTAL) 100 MG tablet Take 100 mg by mouth 2 (two) times daily.    Marland Kitchen lithium 300 MG tablet Take 2 tablets (600 mg total) by mouth at bedtime. 60 tablet 4  . metFORMIN (GLUCOPHAGE) 500 MG tablet TAKE 2 TABLETS IN THE MORNING, 1 TABLET AT LUNCH TIME AND 2 TABLETS  IN THE EVENING 150 tablet 3  . metoprolol (LOPRESSOR) 50 MG tablet Take 1 tablet (50 mg total) by mouth 2 (two) times daily. 60 tablet 6  . potassium chloride (MICRO-K) 10 MEQ CR capsule Take 1 capsule (10 mEq total) by mouth daily as needed. 30 capsule 5  . TEGRETOL-XR 400 MG 12 hr tablet TAKE 1 TABLET BY MOUTH TWICE DAILY 60 tablet 4  . Vilazodone HCl (VIIBRYD) 40 MG TABS Take 1 tablet (40 mg total) by mouth daily. 30 tablet 4  . VYTORIN 10-40 MG tablet TAKE 1 TABLET BY MOUTH AT BEDTIME 30 tablet 11   No current facility-administered medications for this visit.    Allergies:    Allergies  Allergen Reactions  . Morphine And Related Hives    Nausea and vomiting   . Oxycontin [Oxycodone Hcl] Swelling, Hives and Itching    Social History:  The patient  reports that she quit smoking about 36 years ago. Her smoking use included Cigarettes. She has never used smokeless tobacco. She reports that she does not drink alcohol or use illicit drugs.   Family history:   Family History  Problem Relation Age of Onset  . Sarcoidosis Mother   . Seizures Mother   . Heart attack Mother   . Alzheimer's disease Maternal Grandmother   . Dementia Maternal Grandmother   . Hypertension Maternal Grandfather   . Diabetes Maternal Grandfather     ROS:  Please see the history of present illness.  All other systems reviewed and negative.   PHYSICAL EXAM: VS:  BP 120/80 mmHg  Pulse 60  Ht 5\' 3"  (1.6 m)  Wt 221 lb (100.245 kg)  BMI 39.16 kg/m2  LMP  (LMP Unknown) obese, well developed, in no acute distress HEENT: Pupils are equal round react to light accommodation extraocular movements are intact.  Neck: no JVDNo cervical lymphadenopathy. Cardiac: Regular rate and rhythm without murmurs rubs or gallops. Lungs:  clear to auscultation bilaterally, no wheezing, rhonchi or rales Abd: soft, nontender, positive bowel sounds all quadrants, no hepatosplenomegaly Ext: 1+  lower extremity edema.  2+ radial and  dorsalis pedis pulses. Skin: warm and dry Neuro:  Grossly normal     ASSESSMENT AND PLAN:  Problem List Items Addressed This Visit    Obesity (BMI 30-39.9) - Primary (Chronic)   Relevant Orders   Basic metabolic panel   CBC w/Diff/Platelet   INR/PT   DG Chest 2 View   Mixed hyperlipidemia (Chronic)   Relevant Orders   Basic metabolic panel   CBC w/Diff/Platelet   INR/PT   DG Chest 2 View   Diabetes mellitus type 2 in obese [facility])   Relevant Orders   Basic metabolic panel   CBC w/Diff/Platelet   INR/PT   DG Chest 2 View   Abnormal nuclear stress test  Relevant Orders   Basic metabolic panel   CBC w/Diff/Platelet   INR/PT   DG Chest 2 View     Patient will be scheduled for left heart catheterization next.   She is supposed to have lumbar fusion.   If stenting is required, recommend using bare-metal stent so she doesn't have to put off her surgery that much longer.  The patient understands that risks include but are not limited to stroke (1 in 1000), death (1 in 28), kidney failure [usually temporary] (1 in 500), bleeding (1 in 200), allergic reaction [possibly serious] (1 in 200). The patient understands and is willing to proceed.  Her lower extremity edema has not resolved. I asked her to resume daily Lasix 20 mg with potassium.

## 2015-07-30 NOTE — Addendum Note (Signed)
Addended by: Brett Canales on: 07/30/2015 03:23 PM   Modules accepted: Orders

## 2015-07-31 LAB — PROTIME-INR
INR: 1.01 (ref <1.50)
PROTHROMBIN TIME: 13.4 s (ref 11.6–15.2)

## 2015-08-05 ENCOUNTER — Ambulatory Visit: Payer: Medicare Other | Admitting: Cardiovascular Disease

## 2015-08-06 ENCOUNTER — Ambulatory Visit (HOSPITAL_COMMUNITY)
Admission: RE | Admit: 2015-08-06 | Discharge: 2015-08-06 | Disposition: A | Discharge status: Home / Self Care | Payer: Medicare Other | Source: Ambulatory Visit | Attending: Cardiovascular Disease | Admitting: Cardiovascular Disease

## 2015-08-06 ENCOUNTER — Encounter (HOSPITAL_COMMUNITY)
Admission: RE | Disposition: A | Discharge status: Home / Self Care | Payer: Self-pay | Source: Ambulatory Visit | Attending: Cardiovascular Disease

## 2015-08-06 DIAGNOSIS — I10 Essential (primary) hypertension: Secondary | ICD-10-CM | POA: Diagnosis not present

## 2015-08-06 DIAGNOSIS — R931 Abnormal findings on diagnostic imaging of heart and coronary circulation: Secondary | ICD-10-CM

## 2015-08-06 DIAGNOSIS — G4733 Obstructive sleep apnea (adult) (pediatric): Secondary | ICD-10-CM | POA: Diagnosis not present

## 2015-08-06 DIAGNOSIS — E119 Type 2 diabetes mellitus without complications: Secondary | ICD-10-CM | POA: Insufficient documentation

## 2015-08-06 DIAGNOSIS — E785 Hyperlipidemia, unspecified: Secondary | ICD-10-CM | POA: Diagnosis not present

## 2015-08-06 DIAGNOSIS — R9439 Abnormal result of other cardiovascular function study: Secondary | ICD-10-CM

## 2015-08-06 DIAGNOSIS — Z6839 Body mass index (BMI) 39.0-39.9, adult: Secondary | ICD-10-CM | POA: Insufficient documentation

## 2015-08-06 HISTORY — PX: CARDIAC CATHETERIZATION: SHX172

## 2015-08-06 LAB — GLUCOSE, CAPILLARY
GLUCOSE-CAPILLARY: 181 mg/dL — AB (ref 65–99)
Glucose-Capillary: 140 mg/dL — ABNORMAL HIGH (ref 65–99)

## 2015-08-06 SURGERY — LEFT HEART CATH AND CORONARY ANGIOGRAPHY

## 2015-08-06 MED ORDER — ASPIRIN 81 MG PO CHEW
81.0000 mg | CHEWABLE_TABLET | ORAL | Status: AC
  Administered 2015-08-06: 81 mg via ORAL

## 2015-08-06 MED ORDER — SODIUM CHLORIDE 0.9 % IV SOLN: 250.0000 mL | INTRAVENOUS | Status: DC | PRN

## 2015-08-06 MED ORDER — LIDOCAINE HCL (PF) 1 % IJ SOLN
INTRAMUSCULAR | Status: DC | PRN
  Administered 2015-08-06: 15 mL via INTRA_ARTERIAL

## 2015-08-06 MED ORDER — SODIUM CHLORIDE 0.9% FLUSH: 3.0000 mL | Freq: Two times a day (BID) | INTRAVENOUS | Status: DC

## 2015-08-06 MED ORDER — VERAPAMIL HCL 2.5 MG/ML IV SOLN
INTRAVENOUS | Status: AC
  Filled 2015-08-06: qty 2

## 2015-08-06 MED ORDER — MIDAZOLAM HCL 2 MG/2ML IJ SOLN
INTRAMUSCULAR | Status: AC
Start: 2015-08-06 — End: 2015-08-06
  Filled 2015-08-06: qty 2

## 2015-08-06 MED ORDER — SODIUM CHLORIDE 0.9 % WEIGHT BASED INFUSION
3.0000 mL/kg/h | INTRAVENOUS | Status: AC
  Administered 2015-08-06: 3 mL/kg/h via INTRAVENOUS

## 2015-08-06 MED ORDER — ONDANSETRON HCL 4 MG/2ML IJ SOLN: 4.0000 mg | Freq: Four times a day (QID) | INTRAMUSCULAR | Status: DC | PRN

## 2015-08-06 MED ORDER — SODIUM CHLORIDE 0.9% FLUSH: 3.0000 mL | INTRAVENOUS | Status: DC | PRN

## 2015-08-06 MED ORDER — ACETAMINOPHEN 325 MG PO TABS: 650.0000 mg | ORAL_TABLET | ORAL | Status: DC | PRN

## 2015-08-06 MED ORDER — ASPIRIN 81 MG PO CHEW
CHEWABLE_TABLET | ORAL | Status: AC
  Filled 2015-08-06: qty 1

## 2015-08-06 MED ORDER — FENTANYL CITRATE (PF) 100 MCG/2ML IJ SOLN
INTRAMUSCULAR | Status: DC | PRN
  Administered 2015-08-06 (×2): 25 ug via INTRAVENOUS

## 2015-08-06 MED ORDER — HEPARIN (PORCINE) IN NACL 2-0.9 UNIT/ML-% IJ SOLN
INTRAMUSCULAR | Status: DC | PRN
  Administered 2015-08-06: 1000 mL

## 2015-08-06 MED ORDER — FENTANYL CITRATE (PF) 100 MCG/2ML IJ SOLN
INTRAMUSCULAR | Status: AC
  Filled 2015-08-06: qty 2

## 2015-08-06 MED ORDER — HEPARIN SODIUM (PORCINE) 1000 UNIT/ML IJ SOLN
INTRAMUSCULAR | Status: DC | PRN
  Administered 2015-08-06: 5000 [IU] via INTRAVENOUS

## 2015-08-06 MED ORDER — LIDOCAINE HCL (PF) 1 % IJ SOLN
INTRAMUSCULAR | Status: AC
  Filled 2015-08-06: qty 30

## 2015-08-06 MED ORDER — NITROGLYCERIN 1 MG/10 ML FOR IR/CATH LAB
INTRA_ARTERIAL | Status: AC
  Filled 2015-08-06: qty 10

## 2015-08-06 MED ORDER — HEPARIN SODIUM (PORCINE) 1000 UNIT/ML IJ SOLN
INTRAMUSCULAR | Status: AC
  Filled 2015-08-06: qty 1

## 2015-08-06 MED ORDER — SODIUM CHLORIDE 0.9 % IV SOLN
INTRAVENOUS | Status: AC
Start: 2015-08-06 — End: 2015-08-06

## 2015-08-06 MED ORDER — SODIUM CHLORIDE 0.9 % WEIGHT BASED INFUSION: 1.0000 mL/kg/h | INTRAVENOUS | Status: DC

## 2015-08-06 MED ORDER — MIDAZOLAM HCL 2 MG/2ML IJ SOLN
INTRAMUSCULAR | Status: DC | PRN
Start: 2015-08-06 — End: 2015-08-06
  Administered 2015-08-06: 2 mg via INTRAVENOUS

## 2015-08-06 MED ORDER — LIDOCAINE HCL (PF) 1 % IJ SOLN
INTRAMUSCULAR | Status: DC | PRN
  Administered 2015-08-06: 2 mL

## 2015-08-06 SURGICAL SUPPLY — 11 items
CATH INFINITI 5 FR JL3.5 (CATHETERS) ×2 IMPLANT
CATH INFINITI 5FR ANG PIGTAIL (CATHETERS) ×2 IMPLANT
CATH OPTITORQUE TIG 4.0 5F (CATHETERS) ×2 IMPLANT
DEVICE RAD COMP TR BAND LRG (VASCULAR PRODUCTS) ×2 IMPLANT
GLIDESHEATH SLEND SS 6F .021 (SHEATH) ×2 IMPLANT
KIT HEART LEFT (KITS) ×3 IMPLANT
PACK CARDIAC CATHETERIZATION (CUSTOM PROCEDURE TRAY) ×3 IMPLANT
SYR MEDRAD MARK V 150ML (SYRINGE) ×3 IMPLANT
TRANSDUCER W/STOPCOCK (MISCELLANEOUS) ×3 IMPLANT
TUBING CIL FLEX 10 FLL-RA (TUBING) ×3 IMPLANT
WIRE SAFE-T 1.5MM-J .035X260CM (WIRE) ×4 IMPLANT

## 2015-08-06 NOTE — Discharge Instructions (Signed)
Radial Site Care °Refer to this sheet in the next few weeks. These instructions provide you with information about caring for yourself after your procedure. Your health care provider may also give you more specific instructions. Your treatment has been planned according to current medical practices, but problems sometimes occur. Call your health care provider if you have any problems or questions after your procedure. °WHAT TO EXPECT AFTER THE PROCEDURE °After your procedure, it is typical to have the following: °· Bruising at the radial site that usually fades within 1-2 weeks. °· Blood collecting in the tissue (hematoma) that may be painful to the touch. It should usually decrease in size and tenderness within 1-2 weeks. °HOME CARE INSTRUCTIONS °· Take medicines only as directed by your health care provider. °· You may shower 24-48 hours after the procedure or as directed by your health care provider. Remove the bandage (dressing) and gently wash the site with plain soap and water. Pat the area dry with a clean towel. Do not rub the site, because this may cause bleeding. °· Do not take baths, swim, or use a hot tub until your health care provider approves. °· Check your insertion site every day for redness, swelling, or drainage. °· Do not apply powder or lotion to the site. °· Do not flex or bend the affected arm for 24 hours or as directed by your health care provider. °· Do not push or pull heavy objects with the affected arm for 24 hours or as directed by your health care provider. °· Do not lift over 10 lb (4.5 kg) for 5 days after your procedure or as directed by your health care provider. °· Ask your health care provider when it is okay to: °¨ Return to work or school. °¨ Resume usual physical activities or sports. °¨ Resume sexual activity. °· Do not drive home if you are discharged the same day as the procedure. Have someone else drive you. °· You may drive 24 hours after the procedure unless otherwise  instructed by your health care provider. °· Do not operate machinery or power tools for 24 hours after the procedure. °· If your procedure was done as an outpatient procedure, which means that you went home the same day as your procedure, a responsible adult should be with you for the first 24 hours after you arrive home. °· Keep all follow-up visits as directed by your health care provider. This is important. °SEEK MEDICAL CARE IF: °· You have a fever. °· You have chills. °· You have increased bleeding from the radial site. Hold pressure on the site. °SEEK IMMEDIATE MEDICAL CARE IF: °· You have unusual pain at the radial site. °· You have redness, warmth, or swelling at the radial site. °· You have drainage (other than a small amount of blood on the dressing) from the radial site. °· The radial site is bleeding, and the bleeding does not stop after 30 minutes of holding steady pressure on the site. °· Your arm or hand becomes pale, cool, tingly, or numb. °  °This information is not intended to replace advice given to you by your health care provider. Make sure you discuss any questions you have with your health care provider. °  °Document Released: 05/22/2010 Document Revised: 05/10/2014 Document Reviewed: 11/05/2013 °Elsevier Interactive Patient Education ©2016 [employer] ° °

## 2015-08-07 ENCOUNTER — Encounter (HOSPITAL_COMMUNITY): Payer: Self-pay | Admitting: Cardiovascular Disease

## 2015-08-07 ENCOUNTER — Other Ambulatory Visit: Payer: Self-pay | Admitting: Neurosurgery

## 2015-08-07 ENCOUNTER — Telehealth: Payer: Self-pay

## 2015-08-07 NOTE — Telephone Encounter (Signed)
Received call from patient.She stated she needs surgical clearance from [facility] for back surgery with Dr.Jenkins.Message sent to [facility].

## 2015-08-11 ENCOUNTER — Encounter: Payer: Self-pay | Admitting: Psychiatry

## 2015-08-11 ENCOUNTER — Ambulatory Visit (INDEPENDENT_AMBULATORY_CARE_PROVIDER_SITE_OTHER): Payer: 59 | Admitting: Psychiatry

## 2015-08-11 VITALS — BP 138/88 | HR 75 | Temp 97.7°F | Ht 63.0 in | Wt 220.6 lb

## 2015-08-11 DIAGNOSIS — F331 Major depressive disorder, recurrent, moderate: Secondary | ICD-10-CM

## 2015-08-11 MED ORDER — VILAZODONE HCL 40 MG PO TABS: 40.0000 mg | ORAL_TABLET | Freq: Every day | ORAL | Status: DC

## 2015-08-11 MED ORDER — LITHIUM CARBONATE 300 MG PO TABS: 600.0000 mg | ORAL_TABLET | Freq: Every day | ORAL | Status: DC

## 2015-08-11 NOTE — Progress Notes (Signed)
Patient ID: Abigail Figueroa, female   DOB: August 18, 1960, 55 y.o.   MRN: [id number] [facility] MD/PA/NP OP Progress Note  08/11/2015 10:19 AM Abigail Figueroa  MRN:  [id number]  Subjective:  Patient returns for follow-up of her major depressive disorder, recurrent moderate. Patient was previously seen by Dr. Jimmye Norman and this is the first visit for this patient with this clinician. Today she presents along with her husband and walks with the help of a walker. States that she is doing okay except for her back pain. She is looking forward to the surgery soon. States she was supposed to the surgery back in March but they found an abnormality in her stress test and wanted to check out her heart. States that she's been cleared for surgery and will have it on May 11. Reports fair sleep and appetite. Taking all her medications regularly.  she takes Tegretol for seizures and Lamictal for migraines and these are prescribed by her other physicians.  Chief Complaint: my back depression Visit Diagnosis:   No diagnosis found.  Past Medical History:  Past Medical History  Diagnosis Date  . Edema   . Heart palpitations June 2013    Event monitor showing sinus tachycardia, PACs with couplets and triplets.  . Obesity   . Diabetes ([place])   . Hypercholesterolemia   . Seizure disorder ([place])   . Migraine   . OSA (obstructive sleep apnea)     has been on continuous positive airway pressure  . Hx of echocardiogram 11/18/2009    showed normal left ventricular function, mild left ventricular hypertrophy, and no significant valve abnormalities.  . History of stress test 11/25/2009    Normal myocardial perfusion imaging  . Hypertension   . Dysrhythmia     palpitations  . Sleep apnea     CPAP  . Diabetes mellitus without complication ([place])   . Depression   . Anxiety   . Headache     migraines, gets botox injections  . Anemia   . Arthritis     Past Surgical History  Procedure Laterality Date  . Cholecystectomy   1980s  . Appendectomy  1980s  . Abdominal hysterectomy  1990    without BSO  . Back surgery  1900s 2001    x2 with "cage put in"  . Knee surgery Right 2012    meniscus tear  . Abdominal hysterectomy  1990  . Cholecystectomy  1980  . Tubal ligation  1982  . Back surgery  2001  . Knee surgery  2012  . Laminectomy  1995  . Carpal tunnel release Bilateral   . Bilateral salpingoophorectomy  1990  . Dorsal compartment release Left 10/25/2014    Procedure: LEFT FIRST  DORSAL COMPARTMENT RELEASE AND RADIAL TENOSYNOVECTOMY ;  Surgeon: Roseanne Kaufman, MD;  Location: [facility];  Service: Orthopedics;  Laterality: Left;  . Hand surgery    . Wrist surgery Right   . Cardiac catheterization N/A 08/06/2015    Procedure: Left Heart Cath and Coronary Angiography;  Surgeon: Troy Sine, MD;  Location: [facility];  Service: Cardiovascular;  Laterality: N/A;   Family History:  Family History  Problem Relation Age of Onset  . Sarcoidosis Mother   . Seizures Mother   . Heart attack Mother   . Alzheimer's disease Maternal Grandmother   . Dementia Maternal Grandmother   . Hypertension Maternal Grandfather   . Diabetes Maternal Grandfather    Social History:  Social History   Social  History  . Marital Status: Married    Spouse Name: N/A  . Number of Children: N/A  . Years of Education: N/A   Social History Main Topics  . Smoking status: Former Smoker    Types: Cigarettes    Quit date: 01/03/1979  . Smokeless tobacco: Never Used     Comment: quit in 1984  . Alcohol Use: No  . Drug Use: No  . Sexual Activity: No   Other Topics Concern  . Not on file   Social History Narrative   ** Merged History Encounter **       She is a married mother of 2, grandmother 3. She tries to get exercise but is not doing any routine program.   She quit smoking over 25 years ago does not drink alcohol.   Additional History:   Assessment:   Musculoskeletal: Strength & Muscle  Tone: within normal limits Gait & Station: Slow and was ambulating with a walker Patient leans: N/A  Psychiatric Specialty Exam: HPI  Review of Systems  Psychiatric/Behavioral: Positive for depression (depressionis somewhat related  dealing with her back pain). Negative for suicidal ideas, hallucinations, memory loss and substance abuse. The patient is not nervous/anxious and does not have insomnia.     There were no vitals taken for this visit.There is no weight on file to calculate BMI.  General Appearance: Well Groomed  Eye Contact:  Good  Speech:  Clear and Coherent and Normal Rate  Volume:  Normal  Mood:  Okay  Affect:  Restricted however brightened at the end of the appointment and did say thank you to this writer  Thought Process:  Linear  Orientation:  Full (Time, Place, and Person)  Thought Content:  Negative  Suicidal Thoughts:  No  Homicidal Thoughts:  No  Memory:  Immediate;   Good Recent;   Good Remote;   Good  Judgement:  Good  Insight:  Good  Psychomotor Activity:  Negative  Concentration:  Good  Recall:  Good  Fund of Knowledge: Good  Language: Good  Akathisia:  Negative  Handed:  Right  AIMS (if indicated):  Not done  Assets:  Communication Skills Desire for Improvement Social Support  ADL's:  Intact  Cognition: WNL  Sleep:  Poor, not using CPAP, daytime napping   Is the patient at risk to self?  No. Has the patient been a risk to self in the past 6 months?  No. Has the patient been a risk to self within the distant past?  No. Is the patient a risk to others?  No. Has the patient been a risk to others in the past 6 months?  No. Has the patient been a risk to others within the distant past?  No.  Current Medications: Current Outpatient Prescriptions  Medication Sig Dispense Refill  . baclofen (LIORESAL) 10 MG tablet Take 1 tablet (10 mg total) by mouth 3 (three) times daily. (Patient taking differently: Take 10 mg by mouth 3 (three) times daily as  needed (Headaches). ) 30 each 0  . BOTOX 100 UNITS SOLR injection Inject into the muscle every 3 (three) months.     . chlorproMAZINE (THORAZINE) 25 MG tablet Take 25 mg by mouth 3 (three) times daily as needed (migraines).     . fluocinonide ointment (LIDEX) [id number] % Apply 1 application topically 2 (two) times daily. (Patient taking differently: Apply 1 application topically 2 (two) times daily as needed. ) 30 g 1  . furosemide (LASIX) 20 MG tablet Take  1 tablet (20 mg total) by mouth daily as needed for fluid or edema. 30 tablet 5  . HYDROcodone-acetaminophen (NORCO/VICODIN) 5-325 MG tablet Take 1 tablet by mouth every 6 (six) hours as needed for moderate pain.    Marland Kitchen lamoTRIgine (LAMICTAL) 100 MG tablet Take 100 mg by mouth 2 (two) times daily.    Marland Kitchen lithium 300 MG tablet Take 2 tablets (600 mg total) by mouth at bedtime. 60 tablet 4  . metFORMIN (GLUCOPHAGE) 500 MG tablet TAKE 2 TABLETS IN THE MORNING, 1 TABLET AT LUNCH TIME AND 2 TABLETS IN THE EVENING 150 tablet 3  . metoprolol (LOPRESSOR) 50 MG tablet Take 1 tablet (50 mg total) by mouth 2 (two) times daily. 60 tablet 6  . potassium chloride (MICRO-K) 10 MEQ CR capsule Take 1 capsule (10 mEq total) by mouth daily as needed. 30 capsule 5  . TEGRETOL-XR 400 MG 12 hr tablet TAKE 1 TABLET BY MOUTH TWICE DAILY 60 tablet 4  . Vilazodone HCl (VIIBRYD) 40 MG TABS Take 1 tablet (40 mg total) by mouth daily. 30 tablet 4  . VYTORIN 10-40 MG tablet TAKE 1 TABLET BY MOUTH AT BEDTIME 30 tablet 11   No current facility-administered medications for this visit.    Medical Decision Making:  Established Problem, Stable/Improving (1) and Review or order clinical lab tests (1)  Treatment Plan Summary:Medication management   Major depressive disorder, recurrent, moderate-Continue Viibryd 40 mg daily. She'll continue on the lithium,  600 mg at bedtime. She'll follow up in 3 months.   Patient to obtain a lithium level by next visit. Lab slip was given  today.  Patient takes Lamictal and chlorpromazine and Tegretol for migraine treatment. These are prescribed by another physician.   She's been encouraged call any questions or concerns prior to her next appointment.  Marcio Hoque 08/11/2015, 10:19 AM

## 2015-08-12 NOTE — Telephone Encounter (Signed)
Patient has appointment to see [facility] tomorrow. Clearance can be given during this visit.

## 2015-08-13 ENCOUNTER — Ambulatory Visit: Payer: Medicare Other | Admitting: Cardiology

## 2015-08-18 NOTE — Telephone Encounter (Signed)
[email or web address] 

## 2015-08-19 ENCOUNTER — Ambulatory Visit (INDEPENDENT_AMBULATORY_CARE_PROVIDER_SITE_OTHER): Payer: Medicare Other | Admitting: Nurse Practitioner

## 2015-08-19 ENCOUNTER — Encounter: Payer: Self-pay | Admitting: Nurse Practitioner

## 2015-08-19 VITALS — BP 138/70 | HR 59 | Ht 63.0 in | Wt 221.2 lb

## 2015-08-19 DIAGNOSIS — M545 Low back pain: Secondary | ICD-10-CM

## 2015-08-19 DIAGNOSIS — E119 Type 2 diabetes mellitus without complications: Secondary | ICD-10-CM

## 2015-08-19 DIAGNOSIS — Z01810 Encounter for preprocedural cardiovascular examination: Secondary | ICD-10-CM

## 2015-08-19 DIAGNOSIS — I10 Essential (primary) hypertension: Secondary | ICD-10-CM

## 2015-08-19 DIAGNOSIS — E1122 Type 2 diabetes mellitus with diabetic chronic kidney disease: Secondary | ICD-10-CM | POA: Insufficient documentation

## 2015-08-19 DIAGNOSIS — E7800 Pure hypercholesterolemia, unspecified: Secondary | ICD-10-CM

## 2015-08-19 DIAGNOSIS — R9439 Abnormal result of other cardiovascular function study: Secondary | ICD-10-CM | POA: Diagnosis not present

## 2015-08-19 NOTE — Patient Instructions (Signed)
Your physician recommends that you continue on your current medications as directed. Please refer to the Current Medication list given to you today.  Your physician recommends that you schedule a follow-up appointment in 6 months with Dr Claiborne Billings  If you need a refill on your cardiac medications before your next appointment, please call your pharmacy.

## 2015-08-19 NOTE — Progress Notes (Signed)
Office Visit    Patient Name: Abigail Figueroa Date of Encounter: 08/19/2015  Primary Care Provider:  Vernie Murders, PA Primary Cardiologist:  Corky Downs, MD   Chief Complaint     55 year old female who presents for follow-up after recent diagnostic catheterization.  Past Medical History    Past Medical History  Diagnosis Date  . Edema   . Heart palpitations June 2013    Event monitor showing sinus tachycardia, PACs with couplets and triplets.  . Morbid obesity ([place])   . Hypercholesterolemia   . Seizure disorder ([place])   . Migraine   . OSA (obstructive sleep apnea)     has been on continuous positive airway pressure  . Hx of echocardiogram     a. 10/2009 Echo: showed normal left ventricular function, mild left ventricular hypertrophy, and no significant valve abnormalities.  . Abnormal stress test     a. 10/2009: Normal myocardial perfusion imaging; b. 08/2015 MV: medium defect of mod severity in mid ant apical region w/ mild HK of the distal inf wall;  c. 08/2015 Cath: nl Cors, EF 60%.  . Essential hypertension   . Type II diabetes mellitus ([place])   . Depression   . Anxiety   . Headache     migraines, gets botox injections  . Anemia   . Arthritis    Past Surgical History  Procedure Laterality Date  . Cholecystectomy  1980s  . Appendectomy  1980s  . Abdominal hysterectomy  1990    without BSO  . Back surgery  1900s 2001    x2 with "cage put in"  . Knee surgery Right 2012    meniscus tear  . Abdominal hysterectomy  1990  . Cholecystectomy  1980  . Tubal ligation  1982  . Back surgery  2001  . Knee surgery  2012  . Laminectomy  1995  . Carpal tunnel release Bilateral   . Bilateral salpingoophorectomy  1990  . Dorsal compartment release Left 10/25/2014    Procedure: LEFT FIRST  DORSAL COMPARTMENT RELEASE AND RADIAL TENOSYNOVECTOMY ;  Surgeon: Roseanne Kaufman, MD;  Location: [place];  Service: Orthopedics;  Laterality: Left;  . Hand surgery    .  Wrist surgery Right   . Cardiac catheterization N/A 08/06/2015    Procedure: Left Heart Cath and Coronary Angiography;  Surgeon: Troy Sine, MD;  Location: [facility];  Service: Cardiovascular;  Laterality: N/A;   Allergies  Allergies  Allergen Reactions  . Morphine And Related Hives    Nausea and vomiting   . Oxycontin [Oxycodone Hcl] Swelling, Hives and Itching    History of Present Illness     55 year old female with a prior history of hypertension, hyperlipidemia, diabetes, sleep apnea , obesity, and depression. She's been expressing low back pain and has been evaluated by surgery with plan for  Lumbar fusion, tentatively scheduled for April 24. She was seen in cardiology clinic in late March for preoperative clearance and underwent stress testing  A medium defect of moderate severity in the mid anteroapical region. Given this finding, diagnostic catheterization was arranged. This was performed in early April showing normal coronary arteries and normal LV function. Since her catheterization, she has recovered well. She has not been having any chest pain or dyspnea and her right wrist has healed up well,  though remains slightly sore. She continues to have low back pain when she is up and about and is looking forward to her surgery. She denies  PND, orthopnea, dizziness, syncope, or early satiety.  She does have some degree of chronic bilateral lower extremity edema.She is compliant with her CPAP.  Home Medications    Prior to Admission medications   Medication Sig Start Date End Date Taking? Authorizing Provider  baclofen (LIORESAL) 10 MG tablet Take 1 tablet (10 mg total) by mouth 3 (three) times daily. Patient taking differently: Take 10 mg by mouth 3 (three) times daily as needed (Headaches).  04/10/15  Yes Dennis E Chrismon, PA  BOTOX 100 UNITS SOLR injection Inject into the muscle every 3 (three) months.  11/01/12  Yes Historical Provider, MD  chlorproMAZINE (THORAZINE) 25 MG  tablet Take 25 mg by mouth 3 (three) times daily as needed (migraines).    Yes Historical Provider, MD  fluocinonide ointment (LIDEX) [id number] % Apply 1 application topically 2 (two) times daily. Patient taking differently: Apply 1 application topically 2 (two) times daily as needed.  04/10/15  Yes Dennis E Chrismon, PA  furosemide (LASIX) 20 MG tablet Take 1 tablet (20 mg total) by mouth daily as needed for fluid or edema. 07/22/15  Yes Brett Canales, PA-C  HYDROcodone-acetaminophen (NORCO/VICODIN) 5-325 MG tablet Take 1 tablet by mouth every 6 (six) hours as needed for moderate pain.   Yes Historical Provider, MD  lamoTRIgine (LAMICTAL) 100 MG tablet Take 100 mg by mouth 2 (two) times daily.   Yes Historical Provider, MD  lithium 300 MG tablet Take 2 tablets (600 mg total) by mouth at bedtime. 08/11/15  Yes Himabindu Ravi, MD  metFORMIN (GLUCOPHAGE) 500 MG tablet TAKE 2 TABLETS IN THE MORNING, 1 TABLET AT LUNCH TIME AND 2 TABLETS IN THE EVENING 04/15/15  Yes Dennis E Chrismon, PA  metoprolol (LOPRESSOR) 50 MG tablet Take 1 tablet (50 mg total) by mouth 2 (two) times daily. 02/13/15  Yes Troy Sine, MD  potassium chloride (MICRO-K) 10 MEQ CR capsule Take 1 capsule (10 mEq total) by mouth daily as needed. 07/22/15  Yes Einar Pheasant Hager, PA-C  TEGRETOL-XR 400 MG 12 hr tablet TAKE 1 TABLET BY MOUTH TWICE DAILY 05/08/15  Yes Vickki Muff Chrismon, PA  Vilazodone HCl (VIIBRYD) 40 MG TABS Take 1 tablet (40 mg total) by mouth daily. 08/11/15  Yes Himabindu Ravi, MD  VYTORIN 10-40 MG tablet TAKE 1 TABLET BY MOUTH AT BEDTIME 03/04/15  Yes Troy Sine, MD    Review of Systems     she has been experiencing low back pain for some time now. She is pending surgery. She also has mild chronic lower extremity edema. She denies chest pain, palpitations, PND, orthopnea, dizziness, syncope, or early satiety.  All other systems reviewed and are otherwise negative except as noted above.  Physical Exam    VS:  BP 138/70 mmHg   Pulse 59  Ht 5\' 3"  (1.6 m)  Wt 221 lb 3.2 oz (100.336 kg)  BMI 39.19 kg/m2  LMP  (LMP Unknown) , BMI Body mass index is 39.19 kg/(m^2). GEN: Well nourished, well developed, in no acute distress. HEENT: normal. Neck: Supple,  No bruits or masses. Obese, difficult to assess JVP. Cardiac: RRR, no murmurs, rubs, or gallops. No clubbing, cyanosis,  Trace to 1+ bilateral lower extremity edema.  Radials/DP/PT 2+ and equal bilaterally.  The right wrist catheterization site is without bleeding, bruit, or hematoma. Respiratory:  Respirations regular and unlabored, clear to auscultation bilaterally. GI: Soft, nontender, nondistended, BS + x 4. MS: no deformity or atrophy. Skin: warm and dry, no rash. Neuro:  Strength and sensation are intact. Psych: Normal affect.  Accessory Clinical Findings     none  Assessment & Plan    1.   Abnormal stress test /preoperative evaluation: patient recently underwent stress testing for preoperative clearance pending lumbar surgery. Stress testing was abnormal, which led to diagnostic catheterization. Catheterization showed normal coronary arteries and normal LV function. She does not require any additional cardiac workup at this time and is felt to be low risk for cardiac complications related to her lumbar surgery.   2. Essential hypertension: Blood pressure is stable on beta blocer and low-dose Lasix therapy.  3. Hyperlipidemia: She is on Vytorin and this is followed by primary care. Of note, LDL was 172 in September 2016.  4. Type 2 diabetes mellitus: Managed by primary care. She takes metformin.  5.  Depression : this is followed by behavioral health. This is been stable.  6.  Low back pain: As above, she is pending lumbar surgery and will not require any additional cardiac evaluation.   7. Morbid obesity: activity has been limited in the setting of back pain. She will benefit from long-term rehabilitation efforts following her surgery.   8. OSA:  She  reports compliance with CPAP.  9.  Disposition: Follow-up with Dr. Claiborne Billings in 6 months as he follows her sleep apnea.   Murray Hodgkins, NP 08/19/2015, 1:02 PM

## 2015-08-22 ENCOUNTER — Encounter (HOSPITAL_COMMUNITY): Payer: Self-pay

## 2015-08-22 ENCOUNTER — Other Ambulatory Visit: Payer: Self-pay | Admitting: Psychiatry

## 2015-08-22 ENCOUNTER — Encounter (HOSPITAL_COMMUNITY)
Admission: RE | Admit: 2015-08-22 | Discharge: 2015-08-22 | Disposition: A | Discharge status: Home / Self Care | Payer: Medicare Other | Source: Ambulatory Visit | Attending: Neurosurgery | Admitting: Neurosurgery

## 2015-08-22 DIAGNOSIS — Z7984 Long term (current) use of oral hypoglycemic drugs: Secondary | ICD-10-CM | POA: Diagnosis not present

## 2015-08-22 DIAGNOSIS — Z87891 Personal history of nicotine dependence: Secondary | ICD-10-CM | POA: Diagnosis not present

## 2015-08-22 DIAGNOSIS — I10 Essential (primary) hypertension: Secondary | ICD-10-CM | POA: Diagnosis not present

## 2015-08-22 DIAGNOSIS — Z9049 Acquired absence of other specified parts of digestive tract: Secondary | ICD-10-CM | POA: Diagnosis not present

## 2015-08-22 DIAGNOSIS — E7800 Pure hypercholesterolemia, unspecified: Secondary | ICD-10-CM | POA: Diagnosis not present

## 2015-08-22 DIAGNOSIS — Z833 Family history of diabetes mellitus: Secondary | ICD-10-CM | POA: Diagnosis not present

## 2015-08-22 DIAGNOSIS — Z79899 Other long term (current) drug therapy: Secondary | ICD-10-CM | POA: Diagnosis not present

## 2015-08-22 DIAGNOSIS — F329 Major depressive disorder, single episode, unspecified: Secondary | ICD-10-CM | POA: Diagnosis not present

## 2015-08-22 DIAGNOSIS — Z8249 Family history of ischemic heart disease and other diseases of the circulatory system: Secondary | ICD-10-CM | POA: Diagnosis not present

## 2015-08-22 DIAGNOSIS — E119 Type 2 diabetes mellitus without complications: Secondary | ICD-10-CM | POA: Diagnosis not present

## 2015-08-22 DIAGNOSIS — G4733 Obstructive sleep apnea (adult) (pediatric): Secondary | ICD-10-CM | POA: Diagnosis not present

## 2015-08-22 DIAGNOSIS — Z885 Allergy status to narcotic agent status: Secondary | ICD-10-CM | POA: Diagnosis not present

## 2015-08-22 DIAGNOSIS — M4806 Spinal stenosis, lumbar region: Secondary | ICD-10-CM | POA: Diagnosis not present

## 2015-08-22 HISTORY — DX: Frequency of micturition: R35.0

## 2015-08-22 HISTORY — DX: Unspecified convulsions: R56.9

## 2015-08-22 HISTORY — DX: Myoneural disorder, unspecified: G70.9

## 2015-08-22 HISTORY — DX: Nocturia: R35.1

## 2015-08-22 LAB — BASIC METABOLIC PANEL
Anion gap: 9 (ref 5–15)
BUN: 13 mg/dL (ref 6–20)
CO2: 22 mmol/L (ref 22–32)
CREATININE: 1.25 mg/dL — AB (ref 0.44–1.00)
Calcium: 10.2 mg/dL (ref 8.9–10.3)
Chloride: 109 mmol/L (ref 101–111)
GFR calc Af Amer: 55 mL/min — ABNORMAL LOW (ref >60)
GFR, EST NON AFRICAN AMERICAN: 48 mL/min — AB (ref >60)
Glucose, Bld: 208 mg/dL — ABNORMAL HIGH (ref 65–99)
POTASSIUM: 4.2 mmol/L (ref 3.5–5.1)
SODIUM: 140 mmol/L (ref 135–145)

## 2015-08-22 LAB — CBC
HEMATOCRIT: 34.4 % — AB (ref 36.0–46.0)
Hemoglobin: 10.6 g/dL — ABNORMAL LOW (ref 12.0–15.0)
MCH: 26.6 pg (ref 26.0–34.0)
MCHC: 30.8 g/dL (ref 30.0–36.0)
MCV: 86.4 fL (ref 78.0–100.0)
PLATELETS: 252 10*3/uL (ref 150–400)
RBC: 3.98 MIL/uL (ref 3.87–5.11)
RDW: 15 % (ref 11.5–15.5)
WBC: 5.9 10*3/uL (ref 4.0–10.5)

## 2015-08-22 LAB — SURGICAL PCR SCREEN
MRSA, PCR: NEGATIVE
STAPHYLOCOCCUS AUREUS: NEGATIVE

## 2015-08-22 LAB — TYPE AND SCREEN
ABO/RH(D): A POS
ANTIBODY SCREEN: NEGATIVE

## 2015-08-22 LAB — GLUCOSE, CAPILLARY: Glucose-Capillary: 188 mg/dL — ABNORMAL HIGH (ref 65–99)

## 2015-08-22 NOTE — Progress Notes (Signed)
Anesthesia Chart Review: Patient is a 55 year old female scheduled for L4-5 decompression with PLIF on 08/25/15 by Dr. Arnoldo Morale. Procedure was initially scheduled for 07/28/15 but was canceled due to abnormal stress test. Since then she underwent a cardiac cath that showed normal coronaries.  History includes former smoker, palpitations, edema, hypercholesterolemia, seizure disorder, migraines, OSA (CPAP), HTN, DM2, depression, anxiety, anemia, arthritis, cholecystectomy, hysterectomy, back surgery. False positive stress test with normal coronaries 08/2015. BMI is consistent with obesity. PCP is listed as Vernie Murders, PA-C. Cardiologist is Dr. Shelva Majestic.  Meds include baclofen, Botox, Thorazine, Lasix, Norco, Lamictal, lithium, metformin, Lopressor, KCl, Tegretol, Vilazodone, Vytorin.  07/22/15 EKG: NSR, moderate voltage criteria for LVH, may be normal variant, non-specific T wave abnormality.  08/06/15 LHC:  Normal coronary arteries. Normal LV function with an ejection fraction of at least 60%. RECOMMENDATION: The patient's stress test is a false positive study, most likely due to her morbid obesity and body habitus. She has normal coronary arteries and normal LV function. She is given preoperative clearance for her planned lumbar surgery to be done by Dr. Arnoldo Morale.  07/25/15 Nuclear stress test:  Nuclear stress EF: 56%. Mild hypokinesis in the distal inferior wall segment.  T wave inversion was noted during stress in the II, III, aVF, V5 and V6 leads.  Defect 1: There is a medium defect of moderate severity present in the mid anterior, apical anterior and apex location.  Findings consistent with ischemia. Note, sensitivity and specificity reduced by body habitus, 2 day study.  This is an intermediate risk study.  11/18/09 Echo: Summary: Technically difficult. Pericardial fat pad noted. Mild concentric LVH. LV systolic function is normal. Trace MR. Trace TR.  07/30/15 CXR: IMPRESSION: No  active cardiopulmonary disease.  Preoperative labs noted. H/H 10.6/34.4. T&S done. Cr 1.25. Glucose 208. A1c 07/21/15 was 9.2. She will get a fasting CBG on arrival.  Patient with recent cath showing normal coronaries and normal EF. If no acute changes then I would anticipate that she could proceed as planned.  [facility] Short Stay Center/Anesthesiology Phone [phone] 08/22/2015 4:33 PM

## 2015-08-22 NOTE — Pre-Procedure Instructions (Signed)
Abigail Figueroa  08/22/2015      [id number] [address], [place] [address] Phone: [phone] Fax: [phone]    Your procedure is scheduled on August 25, 2015.  Report to [facility] Admitting at 5:30 A.M.  Call this number if you have problems the morning of surgery:  [phone]   Remember:  Do not eat food or drink liquids after midnight.  Take these medicines the morning of surgery with A SIP OF WATER : lamoTRIgine (LAMICTAL), TEGRETOL-XR, Vilazodone HCl (VIIBRYD),  metoprolol (LOPRESSOR)   IF NEEDED: HYDROcodone-acetaminophen (NORCO/VICODIN),baclofen (LIORESAL) ,  chlorproMAZINE (THORAZINE)   STOP ASPIRIN, HERBAL MEDICATIONS, NSAID'S (ALEVE, ADVIL, IBUPROFEN)      How to Manage Your Diabetes Before and After Surgery  Why is it important to control my blood sugar before and after surgery? . Improving blood sugar levels before and after surgery helps healing and can limit problems. . A way of improving blood sugar control is eating a healthy diet by: o  Eating less sugar and carbohydrates o  Increasing activity/exercise o  Talking with your doctor about reaching your blood sugar goals . High blood sugars (greater than 180 mg/dL) can raise your risk of infections and slow your recovery, so you will need to focus on controlling your diabetes during the weeks before surgery. . Make sure that the doctor who takes care of your diabetes knows about your planned surgery including the date and location.  How do I manage my blood sugar before surgery? . Check your blood sugar at least 4 times a day, starting 2 days before surgery, to make sure that the level is not too high or low. o Check your blood sugar the morning of your surgery when you wake up and every 2 hours until you get to the Short Stay unit. . If your blood sugar is less than 70 mg/dL, you will need to treat for low blood sugar: o Do not take  insulin. o Treat a low blood sugar (less than 70 mg/dL) with  cup of clear juice (cranberry or apple), 4 glucose tablets, OR glucose gel. o Recheck blood sugar in 15 minutes after treatment (to make sure it is greater than 70 mg/dL). If your blood sugar is not greater than 70 mg/dL on recheck, call [phone] for further instructions. . Report your blood sugar to the short stay nurse when you get to Short Stay. . If you are admitted to the hospital after surgery: o Your blood sugar will be checked by the staff and you will probably be given insulin after surgery (instead of oral diabetes medicines) to make sure you have good blood sugar levels. o The goal for blood sugar control after surgery is 80-180 mg/dL.              WHAT DO I DO ABOUT MY DIABETES MEDICATION?   Marland Kitchen Do not take oral diabetes medicines (pills) the morning of surgery.  . The day of surgery, do not take other diabetes injectables, including Byetta (exenatide), Bydureon (exenatide ER), Victoza (liraglutide), or Trulicity (dulaglutide).  Reviewed and Endorsed by [facility] Patient Education Committee, August 2015   Do not wear jewelry, make-up or nail polish.  Do not wear lotions, powders, or perfumes.  You may wear deodorant.  Do not shave 48 hours prior to surgery.    Do not bring valuables to the hospital.  [facility] is not responsible for  any belongings or valuables.  Contacts, dentures or bridgework may not be worn into surgery.  Leave your suitcase in the car.  After surgery it may be brought to your room.  For patients admitted to the hospital, discharge time will be determined by your treatment team.  Patients discharged the day of surgery will not be allowed to drive home.   Name and phone number of your driver:    Special instructions:  "PREPARING FOR SURGERY"  Please read over the following fact sheets that you were given. Pain Booklet, Coughing and Deep Breathing, Blood Transfusion  Information and Surgical Site Infection Prevention

## 2015-08-23 LAB — LITHIUM LEVEL: LITHIUM LVL: 0.9 mmol/L (ref 0.6–1.2)

## 2015-08-23 LAB — RENAL FUNCTION PANEL
Albumin: 4.1 g/dL (ref 3.5–5.5)
BUN / CREAT RATIO: 11 (ref 9–23)
BUN: 14 mg/dL (ref 6–24)
CALCIUM: 10.4 mg/dL — AB (ref 8.7–10.2)
CO2: 22 mmol/L (ref 18–29)
CREATININE: 1.24 mg/dL — AB (ref 0.57–1.00)
Chloride: 102 mmol/L (ref 96–106)
GFR calc Af Amer: 57 mL/min/{1.73_m2} — ABNORMAL LOW (ref >59)
GFR calc non Af Amer: 49 mL/min/{1.73_m2} — ABNORMAL LOW (ref >59)
GLUCOSE: 241 mg/dL — AB (ref 65–99)
POTASSIUM: 4.6 mmol/L (ref 3.5–5.2)
Phosphorus: 3.1 mg/dL (ref 2.5–4.5)
SODIUM: 141 mmol/L (ref 134–144)

## 2015-08-25 ENCOUNTER — Encounter (HOSPITAL_COMMUNITY): Payer: Self-pay | Admitting: *Deleted

## 2015-08-25 ENCOUNTER — Inpatient Hospital Stay (HOSPITAL_COMMUNITY)
Admission: RE | Admit: 2015-08-25 | Discharge: 2015-08-28 | DRG: 460 | Disposition: A | Discharge status: Home Health | Payer: Medicare Other | Source: Ambulatory Visit | Attending: Neurosurgery | Admitting: Neurosurgery

## 2015-08-25 ENCOUNTER — Inpatient Hospital Stay (HOSPITAL_COMMUNITY): Payer: Medicare Other

## 2015-08-25 ENCOUNTER — Inpatient Hospital Stay (HOSPITAL_COMMUNITY): Payer: Medicare Other | Admitting: Critical Care Medicine

## 2015-08-25 ENCOUNTER — Inpatient Hospital Stay (HOSPITAL_COMMUNITY): Payer: Medicare Other | Admitting: Vascular Surgery

## 2015-08-25 ENCOUNTER — Encounter (HOSPITAL_COMMUNITY)
Admission: RE | Disposition: A | Discharge status: Home Health | Payer: Self-pay | Source: Ambulatory Visit | Attending: Neurosurgery

## 2015-08-25 DIAGNOSIS — Z8249 Family history of ischemic heart disease and other diseases of the circulatory system: Secondary | ICD-10-CM | POA: Diagnosis not present

## 2015-08-25 DIAGNOSIS — F419 Anxiety disorder, unspecified: Secondary | ICD-10-CM | POA: Diagnosis present

## 2015-08-25 DIAGNOSIS — Z7984 Long term (current) use of oral hypoglycemic drugs: Secondary | ICD-10-CM

## 2015-08-25 DIAGNOSIS — G40909 Epilepsy, unspecified, not intractable, without status epilepticus: Secondary | ICD-10-CM | POA: Diagnosis present

## 2015-08-25 DIAGNOSIS — M79606 Pain in leg, unspecified: Secondary | ICD-10-CM | POA: Diagnosis present

## 2015-08-25 DIAGNOSIS — M4806 Spinal stenosis, lumbar region: Secondary | ICD-10-CM | POA: Diagnosis not present

## 2015-08-25 DIAGNOSIS — E119 Type 2 diabetes mellitus without complications: Secondary | ICD-10-CM | POA: Diagnosis present

## 2015-08-25 DIAGNOSIS — G4733 Obstructive sleep apnea (adult) (pediatric): Secondary | ICD-10-CM | POA: Diagnosis not present

## 2015-08-25 DIAGNOSIS — Z885 Allergy status to narcotic agent status: Secondary | ICD-10-CM

## 2015-08-25 DIAGNOSIS — Z6839 Body mass index (BMI) 39.0-39.9, adult: Secondary | ICD-10-CM

## 2015-08-25 DIAGNOSIS — Z833 Family history of diabetes mellitus: Secondary | ICD-10-CM | POA: Diagnosis not present

## 2015-08-25 DIAGNOSIS — I10 Essential (primary) hypertension: Secondary | ICD-10-CM | POA: Diagnosis not present

## 2015-08-25 DIAGNOSIS — D649 Anemia, unspecified: Secondary | ICD-10-CM | POA: Diagnosis not present

## 2015-08-25 DIAGNOSIS — M4316 Spondylolisthesis, lumbar region: Secondary | ICD-10-CM | POA: Diagnosis present

## 2015-08-25 DIAGNOSIS — Z87891 Personal history of nicotine dependence: Secondary | ICD-10-CM

## 2015-08-25 DIAGNOSIS — Z79899 Other long term (current) drug therapy: Secondary | ICD-10-CM

## 2015-08-25 DIAGNOSIS — F329 Major depressive disorder, single episode, unspecified: Secondary | ICD-10-CM | POA: Diagnosis present

## 2015-08-25 DIAGNOSIS — Z419 Encounter for procedure for purposes other than remedying health state, unspecified: Secondary | ICD-10-CM

## 2015-08-25 DIAGNOSIS — Z9049 Acquired absence of other specified parts of digestive tract: Secondary | ICD-10-CM

## 2015-08-25 DIAGNOSIS — M4326 Fusion of spine, lumbar region: Secondary | ICD-10-CM | POA: Diagnosis not present

## 2015-08-25 DIAGNOSIS — E7800 Pure hypercholesterolemia, unspecified: Secondary | ICD-10-CM | POA: Diagnosis present

## 2015-08-25 DIAGNOSIS — M5136 Other intervertebral disc degeneration, lumbar region: Secondary | ICD-10-CM | POA: Diagnosis not present

## 2015-08-25 DIAGNOSIS — M1990 Unspecified osteoarthritis, unspecified site: Secondary | ICD-10-CM | POA: Diagnosis not present

## 2015-08-25 DIAGNOSIS — R269 Unspecified abnormalities of gait and mobility: Secondary | ICD-10-CM | POA: Diagnosis not present

## 2015-08-25 LAB — GLUCOSE, CAPILLARY
GLUCOSE-CAPILLARY: 226 mg/dL — AB (ref 65–99)
GLUCOSE-CAPILLARY: 150 mg/dL — AB (ref 65–99)
GLUCOSE-CAPILLARY: 246 mg/dL — AB (ref 65–99)
GLUCOSE-CAPILLARY: 189 mg/dL — AB (ref 65–99)

## 2015-08-25 SURGERY — POSTERIOR LUMBAR FUSION 1 LEVEL
Anesthesia: General

## 2015-08-25 MED ORDER — ONDANSETRON HCL 4 MG/2ML IJ SOLN
INTRAMUSCULAR | Status: AC
  Filled 2015-08-25: qty 2

## 2015-08-25 MED ORDER — ROCURONIUM BROMIDE 50 MG/5ML IV SOLN
INTRAVENOUS | Status: AC
  Filled 2015-08-25: qty 1

## 2015-08-25 MED ORDER — MENTHOL 3 MG MT LOZG: 1.0000 | LOZENGE | OROMUCOSAL | Status: DC | PRN

## 2015-08-25 MED ORDER — LITHIUM CARBONATE 300 MG PO CAPS
600.0000 mg | ORAL_CAPSULE | Freq: Every day | ORAL | Status: DC
Start: 2015-08-25 — End: 2015-08-28
  Administered 2015-08-25 – 2015-08-27 (×3): 600 mg via ORAL
  Filled 2015-08-25 (×3): qty 2

## 2015-08-25 MED ORDER — PROPOFOL 10 MG/ML IV BOLUS
INTRAVENOUS | Status: AC
  Filled 2015-08-25: qty 20

## 2015-08-25 MED ORDER — BACITRACIN ZINC 500 UNIT/GM EX OINT
TOPICAL_OINTMENT | CUTANEOUS | Status: DC | PRN
  Administered 2015-08-25: 1 via TOPICAL

## 2015-08-25 MED ORDER — FENTANYL CITRATE (PF) 100 MCG/2ML IJ SOLN
INTRAMUSCULAR | Status: AC
  Filled 2015-08-25: qty 2

## 2015-08-25 MED ORDER — LAMOTRIGINE 100 MG PO TABS
100.0000 mg | ORAL_TABLET | Freq: Two times a day (BID) | ORAL | Status: DC
  Administered 2015-08-25 – 2015-08-27 (×6): 100 mg via ORAL
  Filled 2015-08-25 (×7): qty 1

## 2015-08-25 MED ORDER — CHLORPROMAZINE HCL 25 MG PO TABS
25.0000 mg | ORAL_TABLET | Freq: Three times a day (TID) | ORAL | Status: DC | PRN
  Filled 2015-08-25: qty 1

## 2015-08-25 MED ORDER — ONDANSETRON HCL 4 MG/2ML IJ SOLN
INTRAMUSCULAR | Status: DC | PRN
  Administered 2015-08-25: 4 mg via INTRAVENOUS

## 2015-08-25 MED ORDER — ROCURONIUM BROMIDE 100 MG/10ML IV SOLN
INTRAVENOUS | Status: DC | PRN
  Administered 2015-08-25: 20 mg via INTRAVENOUS
  Administered 2015-08-25: 50 mg via INTRAVENOUS

## 2015-08-25 MED ORDER — GLYCOPYRROLATE 0.2 MG/ML IJ SOLN
INTRAMUSCULAR | Status: AC
  Filled 2015-08-25: qty 3

## 2015-08-25 MED ORDER — METOPROLOL TARTRATE 25 MG PO TABS
50.0000 mg | ORAL_TABLET | Freq: Two times a day (BID) | ORAL | Status: DC
  Administered 2015-08-25 – 2015-08-27 (×6): 50 mg via ORAL
  Filled 2015-08-25 (×6): qty 2

## 2015-08-25 MED ORDER — METFORMIN HCL 500 MG PO TABS
500.0000 mg | ORAL_TABLET | Freq: Two times a day (BID) | ORAL | Status: DC
  Administered 2015-08-25 – 2015-08-27 (×5): 500 mg via ORAL
  Filled 2015-08-25 (×5): qty 1

## 2015-08-25 MED ORDER — ACETAMINOPHEN 650 MG RE SUPP: 650.0000 mg | RECTAL | Status: DC | PRN

## 2015-08-25 MED ORDER — BISACODYL 10 MG RE SUPP: 10.0000 mg | Freq: Every day | RECTAL | Status: DC | PRN

## 2015-08-25 MED ORDER — VANCOMYCIN HCL 1000 MG IV SOLR
INTRAVENOUS | Status: DC | PRN
  Administered 2015-08-25: 1000 mg via TOPICAL

## 2015-08-25 MED ORDER — BACITRACIN 50000 UNITS IM SOLR
INTRAMUSCULAR | Status: DC | PRN
  Administered 2015-08-25: 09:00:00

## 2015-08-25 MED ORDER — LIDOCAINE HCL (CARDIAC) 20 MG/ML IV SOLN
INTRAVENOUS | Status: AC
  Filled 2015-08-25: qty 5

## 2015-08-25 MED ORDER — EZETIMIBE-SIMVASTATIN 10-40 MG PO TABS
1.0000 | ORAL_TABLET | Freq: Every day | ORAL | Status: DC
Start: 2015-08-25 — End: 2015-08-28
  Administered 2015-08-25 – 2015-08-27 (×3): 1 via ORAL
  Filled 2015-08-25 (×3): qty 1

## 2015-08-25 MED ORDER — BUPIVACAINE LIPOSOME 1.3 % IJ SUSP
INTRAMUSCULAR | Status: DC | PRN
  Administered 2015-08-25: 20 mL

## 2015-08-25 MED ORDER — BUPIVACAINE LIPOSOME 1.3 % IJ SUSP
20.0000 mL | Freq: Once | INTRAMUSCULAR | Status: DC
Start: 2015-08-25 — End: 2015-08-28
  Filled 2015-08-25: qty 20

## 2015-08-25 MED ORDER — PHENYLEPHRINE HCL 10 MG/ML IJ SOLN
10.0000 mg | INTRAVENOUS | Status: DC | PRN
  Administered 2015-08-25: 25 ug/min via INTRAVENOUS

## 2015-08-25 MED ORDER — 0.9 % SODIUM CHLORIDE (POUR BTL) OPTIME
TOPICAL | Status: DC | PRN
  Administered 2015-08-25: 1000 mL

## 2015-08-25 MED ORDER — PHENOL 1.4 % MT LIQD: 1.0000 | OROMUCOSAL | Status: DC | PRN

## 2015-08-25 MED ORDER — PHENYLEPHRINE HCL 10 MG/ML IJ SOLN
INTRAMUSCULAR | Status: DC | PRN
  Administered 2015-08-25 (×2): 80 ug via INTRAVENOUS
  Administered 2015-08-25 (×2): 40 ug via INTRAVENOUS

## 2015-08-25 MED ORDER — CARBAMAZEPINE ER 200 MG PO TB12
400.0000 mg | ORAL_TABLET | Freq: Two times a day (BID) | ORAL | Status: DC
  Administered 2015-08-25 – 2015-08-27 (×6): 400 mg via ORAL
  Filled 2015-08-25: qty 1
  Filled 2015-08-25: qty 2
  Filled 2015-08-25: qty 1
  Filled 2015-08-25 (×2): qty 2
  Filled 2015-08-25: qty 1
  Filled 2015-08-25: qty 2

## 2015-08-25 MED ORDER — ARTIFICIAL TEARS OP OINT
TOPICAL_OINTMENT | OPHTHALMIC | Status: AC
  Filled 2015-08-25: qty 3.5

## 2015-08-25 MED ORDER — FENTANYL CITRATE (PF) 100 MCG/2ML IJ SOLN
25.0000 ug | INTRAMUSCULAR | Status: DC | PRN
  Administered 2015-08-25 (×4): 25 ug via INTRAVENOUS

## 2015-08-25 MED ORDER — BUPIVACAINE-EPINEPHRINE (PF) 0.5% -1:200000 IJ SOLN
INTRAMUSCULAR | Status: DC | PRN
  Administered 2015-08-25: 10 mL via PERINEURAL

## 2015-08-25 MED ORDER — GLYCOPYRROLATE 0.2 MG/ML IJ SOLN
INTRAMUSCULAR | Status: DC | PRN
  Administered 2015-08-25: 0.6 mg via INTRAVENOUS

## 2015-08-25 MED ORDER — FENTANYL CITRATE (PF) 100 MCG/2ML IJ SOLN
INTRAMUSCULAR | Status: DC | PRN
Start: 2015-08-25 — End: 2015-08-25
  Administered 2015-08-25 (×3): 50 ug via INTRAVENOUS
  Administered 2015-08-25: 100 ug via INTRAVENOUS

## 2015-08-25 MED ORDER — VANCOMYCIN HCL 1000 MG IV SOLR
INTRAVENOUS | Status: AC
  Filled 2015-08-25: qty 1000

## 2015-08-25 MED ORDER — METOCLOPRAMIDE HCL 5 MG/ML IJ SOLN: 10.0000 mg | Freq: Once | INTRAMUSCULAR | Status: DC | PRN

## 2015-08-25 MED ORDER — PHENYLEPHRINE 40 MCG/ML (10ML) SYRINGE FOR IV PUSH (FOR BLOOD PRESSURE SUPPORT)
PREFILLED_SYRINGE | INTRAVENOUS | Status: AC
  Filled 2015-08-25: qty 20

## 2015-08-25 MED ORDER — FUROSEMIDE 20 MG PO TABS
20.0000 mg | ORAL_TABLET | Freq: Every day | ORAL | Status: DC
  Administered 2015-08-25 – 2015-08-27 (×3): 20 mg via ORAL
  Filled 2015-08-25 (×3): qty 1

## 2015-08-25 MED ORDER — DOCUSATE SODIUM 100 MG PO CAPS
100.0000 mg | ORAL_CAPSULE | Freq: Two times a day (BID) | ORAL | Status: DC
  Administered 2015-08-25 – 2015-08-27 (×6): 100 mg via ORAL
  Filled 2015-08-25 (×6): qty 1

## 2015-08-25 MED ORDER — SUCCINYLCHOLINE CHLORIDE 20 MG/ML IJ SOLN
INTRAMUSCULAR | Status: AC
  Filled 2015-08-25: qty 1

## 2015-08-25 MED ORDER — ACETAMINOPHEN 325 MG PO TABS: 650.0000 mg | ORAL_TABLET | ORAL | Status: DC | PRN

## 2015-08-25 MED ORDER — HYDROMORPHONE HCL 2 MG PO TABS
4.0000 mg | ORAL_TABLET | ORAL | Status: DC | PRN
  Administered 2015-08-25 – 2015-08-27 (×8): 4 mg via ORAL
  Filled 2015-08-25 (×9): qty 2

## 2015-08-25 MED ORDER — ALUM & MAG HYDROXIDE-SIMETH 200-200-20 MG/5ML PO SUSP: 30.0000 mL | Freq: Four times a day (QID) | ORAL | Status: DC | PRN

## 2015-08-25 MED ORDER — BACLOFEN 10 MG PO TABS
10.0000 mg | ORAL_TABLET | Freq: Three times a day (TID) | ORAL | Status: DC
  Administered 2015-08-25 – 2015-08-27 (×3): 10 mg via ORAL
  Filled 2015-08-25 (×10): qty 1

## 2015-08-25 MED ORDER — VILAZODONE HCL 40 MG PO TABS
40.0000 mg | ORAL_TABLET | Freq: Every day | ORAL | Status: DC
  Administered 2015-08-26 – 2015-08-27 (×2): 40 mg via ORAL
  Filled 2015-08-25 (×3): qty 1

## 2015-08-25 MED ORDER — DIAZEPAM 5 MG PO TABS
ORAL_TABLET | ORAL | Status: AC
  Filled 2015-08-25: qty 1

## 2015-08-25 MED ORDER — LACTATED RINGERS IV SOLN: INTRAVENOUS | Status: DC

## 2015-08-25 MED ORDER — THROMBIN 20000 UNITS EX SOLR
CUTANEOUS | Status: DC | PRN
  Administered 2015-08-25: 09:00:00 via TOPICAL

## 2015-08-25 MED ORDER — DIAZEPAM 5 MG PO TABS
5.0000 mg | ORAL_TABLET | Freq: Four times a day (QID) | ORAL | Status: DC | PRN
  Administered 2015-08-25: 5 mg via ORAL

## 2015-08-25 MED ORDER — EPHEDRINE SULFATE 50 MG/ML IJ SOLN
INTRAMUSCULAR | Status: AC
  Filled 2015-08-25: qty 6

## 2015-08-25 MED ORDER — NEOSTIGMINE METHYLSULFATE 10 MG/10ML IV SOLN
INTRAVENOUS | Status: DC | PRN
  Administered 2015-08-25: 4 mg via INTRAVENOUS

## 2015-08-25 MED ORDER — MEPERIDINE HCL 25 MG/ML IJ SOLN: 6.2500 mg | INTRAMUSCULAR | Status: DC | PRN

## 2015-08-25 MED ORDER — STERILE WATER FOR INJECTION IJ SOLN
INTRAMUSCULAR | Status: AC
  Filled 2015-08-25: qty 10

## 2015-08-25 MED ORDER — PROPOFOL 10 MG/ML IV BOLUS
INTRAVENOUS | Status: DC | PRN
  Administered 2015-08-25: 200 mg via INTRAVENOUS

## 2015-08-25 MED ORDER — MIDAZOLAM HCL 2 MG/2ML IJ SOLN
INTRAMUSCULAR | Status: AC
  Filled 2015-08-25: qty 2

## 2015-08-25 MED ORDER — CEFAZOLIN SODIUM-DEXTROSE 2-4 GM/100ML-% IV SOLN
INTRAVENOUS | Status: AC
  Filled 2015-08-25: qty 100

## 2015-08-25 MED ORDER — PHENYLEPHRINE HCL 10 MG/ML IJ SOLN
INTRAMUSCULAR | Status: AC
  Filled 2015-08-25: qty 1

## 2015-08-25 MED ORDER — HYDROMORPHONE HCL 1 MG/ML IJ SOLN
1.0000 mg | INTRAMUSCULAR | Status: DC | PRN
  Administered 2015-08-25 – 2015-08-26 (×5): 1 mg via INTRAVENOUS
  Filled 2015-08-25 (×5): qty 1

## 2015-08-25 MED ORDER — EPHEDRINE SULFATE 50 MG/ML IJ SOLN
INTRAMUSCULAR | Status: AC
  Filled 2015-08-25: qty 1

## 2015-08-25 MED ORDER — POTASSIUM CHLORIDE CRYS ER 10 MEQ PO TBCR
10.0000 meq | EXTENDED_RELEASE_TABLET | Freq: Every day | ORAL | Status: DC
  Administered 2015-08-25 – 2015-08-27 (×3): 10 meq via ORAL
  Filled 2015-08-25 (×3): qty 1

## 2015-08-25 MED ORDER — CEFAZOLIN SODIUM-DEXTROSE 2-4 GM/100ML-% IV SOLN
2.0000 g | INTRAVENOUS | Status: AC
  Administered 2015-08-25: 2 g via INTRAVENOUS

## 2015-08-25 MED ORDER — LIDOCAINE HCL (CARDIAC) 20 MG/ML IV SOLN
INTRAVENOUS | Status: DC | PRN
  Administered 2015-08-25: 100 mg via INTRAVENOUS

## 2015-08-25 MED ORDER — LACTATED RINGERS IV SOLN
INTRAVENOUS | Status: DC | PRN
  Administered 2015-08-25 (×2): via INTRAVENOUS

## 2015-08-25 MED ORDER — CEFAZOLIN SODIUM-DEXTROSE 2-4 GM/100ML-% IV SOLN
2.0000 g | Freq: Three times a day (TID) | INTRAVENOUS | Status: AC
  Administered 2015-08-25 (×2): 2 g via INTRAVENOUS
  Filled 2015-08-25 (×2): qty 100

## 2015-08-25 MED ORDER — FENTANYL CITRATE (PF) 250 MCG/5ML IJ SOLN
INTRAMUSCULAR | Status: AC
  Filled 2015-08-25: qty 5

## 2015-08-25 MED ORDER — MIDAZOLAM HCL 5 MG/5ML IJ SOLN
INTRAMUSCULAR | Status: DC | PRN
  Administered 2015-08-25: 2 mg via INTRAVENOUS

## 2015-08-25 MED ORDER — ONDANSETRON HCL 4 MG/2ML IJ SOLN: 4.0000 mg | INTRAMUSCULAR | Status: DC | PRN

## 2015-08-25 SURGICAL SUPPLY — 60 items
APL SKNCLS STERI-STRIP NONHPOA (GAUZE/BANDAGES/DRESSINGS) ×1
BAG DECANTER FOR FLEXI CONT (MISCELLANEOUS) ×2 IMPLANT
BENZOIN TINCTURE PRP APPL 2/3 (GAUZE/BANDAGES/DRESSINGS) ×2 IMPLANT
BLADE CLIPPER SURG (BLADE) IMPLANT
BRUSH SCRUB EZ PLAIN DRY (MISCELLANEOUS) ×2 IMPLANT
BUR MATCHSTICK NEURO 3.0 LAGG (BURR) ×2 IMPLANT
BUR PRECISION FLUTE 6.0 (BURR) ×2 IMPLANT
CAGE ALTERA 10X31X9-13 15D (Cage) ×1 IMPLANT
CANISTER SUCT 3000ML PPV (MISCELLANEOUS) ×2 IMPLANT
CAP REVERE LOCKING (Cap) ×4 IMPLANT
CONT SPEC 4OZ CLIKSEAL STRL BL (MISCELLANEOUS) ×2 IMPLANT
COVER BACK TABLE 60X90IN (DRAPES) ×2 IMPLANT
DRAPE C-ARM 42X72 X-RAY (DRAPES) ×4 IMPLANT
DRAPE LAPAROTOMY 100X72X124 (DRAPES) ×2 IMPLANT
DRAPE POUCH INSTRU U-SHP 10X18 (DRAPES) ×2 IMPLANT
DRAPE PROXIMA HALF (DRAPES) ×2 IMPLANT
DRAPE SURG 17X23 STRL (DRAPES) ×8 IMPLANT
ELECT BLADE 4.0 EZ CLEAN MEGAD (MISCELLANEOUS) ×2
ELECT REM PT RETURN 9FT ADLT (ELECTROSURGICAL) ×2
ELECTRODE BLDE 4.0 EZ CLN MEGD (MISCELLANEOUS) ×1 IMPLANT
ELECTRODE REM PT RTRN 9FT ADLT (ELECTROSURGICAL) ×1 IMPLANT
EVACUATOR 1/8 PVC DRAIN (DRAIN) IMPLANT
GAUZE SPONGE 4X4 12PLY STRL (GAUZE/BANDAGES/DRESSINGS) ×2 IMPLANT
GAUZE SPONGE 4X4 16PLY XRAY LF (GAUZE/BANDAGES/DRESSINGS) ×4 IMPLANT
GLOVE BIO SURGEON STRL SZ8 (GLOVE) ×4 IMPLANT
GLOVE BIO SURGEON STRL SZ8.5 (GLOVE) ×4 IMPLANT
GLOVE EXAM NITRILE LRG STRL (GLOVE) IMPLANT
GLOVE EXAM NITRILE MD LF STRL (GLOVE) IMPLANT
GLOVE EXAM NITRILE XL STR (GLOVE) IMPLANT
GLOVE EXAM NITRILE XS STR PU (GLOVE) IMPLANT
GOWN STRL REUS W/ TWL LRG LVL3 (GOWN DISPOSABLE) IMPLANT
GOWN STRL REUS W/ TWL XL LVL3 (GOWN DISPOSABLE) ×2 IMPLANT
GOWN STRL REUS W/TWL 2XL LVL3 (GOWN DISPOSABLE) IMPLANT
GOWN STRL REUS W/TWL LRG LVL3 (GOWN DISPOSABLE)
GOWN STRL REUS W/TWL XL LVL3 (GOWN DISPOSABLE) ×4
KIT BASIN OR (CUSTOM PROCEDURE TRAY) ×2 IMPLANT
KIT ROOM TURNOVER OR (KITS) ×2 IMPLANT
NDL HYPO 21X1.5 SAFETY (NEEDLE) IMPLANT
NEEDLE HYPO 21X1.5 SAFETY (NEEDLE) IMPLANT
NEEDLE HYPO 22GX1.5 SAFETY (NEEDLE) ×2 IMPLANT
NS IRRIG 1000ML POUR BTL (IV SOLUTION) ×2 IMPLANT
PACK LAMINECTOMY NEURO (CUSTOM PROCEDURE TRAY) ×2 IMPLANT
PAD ARMBOARD 7.5X6 YLW CONV (MISCELLANEOUS) ×6 IMPLANT
PATTIES SURGICAL .5 X1 (DISPOSABLE) IMPLANT
ROD REVERE 6.35 40MM (Rod) ×2 IMPLANT
SCREW 7.5X45MM (Screw) ×4 IMPLANT
SPONGE LAP 4X18 X RAY DECT (DISPOSABLE) IMPLANT
SPONGE NEURO XRAY DETECT 1X3 (DISPOSABLE) IMPLANT
SPONGE SURGIFOAM ABS GEL 100 (HEMOSTASIS) ×2 IMPLANT
STRIP BIOACTIVE 20CC 25X100X8 (Miscellaneous) ×1 IMPLANT
STRIP CLOSURE SKIN 1/2X4 (GAUZE/BANDAGES/DRESSINGS) ×2 IMPLANT
SUT VIC AB 1 CT1 18XBRD ANBCTR (SUTURE) ×2 IMPLANT
SUT VIC AB 1 CT1 8-18 (SUTURE) ×4
SUT VIC AB 2-0 CP2 18 (SUTURE) ×4 IMPLANT
SYR 20CC LL (SYRINGE) ×1 IMPLANT
TAPE CLOTH SURG 4X10 WHT LF (GAUZE/BANDAGES/DRESSINGS) ×1 IMPLANT
TOWEL OR 17X24 6PK STRL BLUE (TOWEL DISPOSABLE) ×2 IMPLANT
TOWEL OR 17X26 10 PK STRL BLUE (TOWEL DISPOSABLE) ×2 IMPLANT
TRAY FOLEY W/METER SILVER 14FR (SET/KITS/TRAYS/PACK) ×2 IMPLANT
WATER STERILE IRR 1000ML POUR (IV SOLUTION) ×2 IMPLANT

## 2015-08-25 NOTE — Transfer of Care (Signed)
Immediate Anesthesia Transfer of Care Note  Patient: Abigail Figueroa  Procedure(s) Performed: Procedure(s) with comments: Lumbar four - five decompression with  posterior lumbar interbody fusion with interbody prosthesis posterior lateral arthrodesis and posterior nonsegmental instrumentation (N/A) - L45 decompression with  posterior lumbar interbody fusion with interbody prosthesis posterior lateral arthrodesis and posterior nonsegmental instrumentation  Patient Location: PACU  Anesthesia Type:General  Level of Consciousness: sedated  Airway & Oxygen Therapy: Patient Spontanous Breathing and Patient connected to nasal cannula oxygen  Post-op Assessment: Report given to RN and Post -op Vital signs reviewed and stable  Post vital signs: Reviewed and stable  Last Vitals:  Filed Vitals:   08/25/15 0635  BP: 127/68  Pulse: 67  Temp: 36.5 C  Resp: 18    Complications: No apparent anesthesia complications

## 2015-08-25 NOTE — Progress Notes (Signed)
Subjective:  The patient is somnolent but easily arousable.   Objective: Vital signs in last 24 hours: Temp:  [96.4 F (35.8 C)-97.7 F (36.5 C)] 96.4 F (35.8 C) (04/24 1125) Pulse Rate:  [67-102] 102 (04/24 1130) Resp:  [9-18] 9 (04/24 1130) BP: (127-136)/(68-69) 136/69 mmHg (04/24 1125) SpO2:  [94 %-100 %] 94 % (04/24 1130) Weight:  [100.245 kg (221 lb)] 100.245 kg (221 lb) (04/24 0635)  Intake/Output from previous day:   Intake/Output this shift: Total I/O In: 1700 [I.V.:1700] Out: 165 [Urine:65; Blood:100]  Physical exam the patient is somnolent but arousable. She is moving her lower extremities well.  Lab Results: No results for input(s): WBC, HGB, HCT, PLT in the last 72 hours. BMET No results for input(s): NA, K, CL, CO2, GLUCOSE, BUN, CREATININE, CALCIUM in the last 72 hours.  Studies/Results: Dg Lumbar Spine 1 View  08/25/2015  CLINICAL DATA:  55 year old female undergoing L4-L5 posterior lumbar interbody fusion EXAM: LUMBAR SPINE - 1 VIEW COMPARISON:  Prior radiographs 06/26/2015 FINDINGS: Single cross-table lateral intraoperative image is submitted. Soft tissue retractors are present posterior to the L4 spinous process. A surgical packing sponge is also present in the posterior soft tissues. A metallic probe overlies the L4-L5 facet joint. Prior surgical changes of L5-S1 interbody cage. IMPRESSION: Intraoperative localization radiographs as above. Electronically Signed   By: Jacqulynn Cadet M.D.   On: 08/25/2015 08:59   Dg C-arm 1-60 Min  08/25/2015  CLINICAL DATA:  Lumbar surgery. EXAM: DG C-ARM 61-120 MIN COMPARISON:  08/25/2015. FINDINGS: Lumbar vertebra numbered as per prior exam. Pedicle screws are noted in L4 and L5. Inter disc fusion L4-L5 and L5-S1. No acute bony abnormality . IMPRESSION: Postsurgical changes lumbar spine. Electronically Signed   By: Marcello Moores  Register   On: 08/25/2015 11:50    Assessment/Plan: The patient is doing well. I spoke with her  husband.  LOS: 0 days     Abigail Figueroa D 08/25/2015, 11:55 AM

## 2015-08-25 NOTE — Progress Notes (Signed)
Orthopedic Tech Progress Note Patient Details:  Abigail Figueroa December 15, 1960 [id number]  Patient ID: Abigail Figueroa, female   DOB: 07/29/1960, 55 y.o.   MRN: [id number] Called in bio-tech brace order; spoke with Duffy Rhody, Shyah Cadmus 08/25/2015, 11:48 AM

## 2015-08-25 NOTE — Op Note (Signed)
Brief history:the patient is a 55 year old black female who's had a prior L5-S1 threaded titanium interbody fusion by another physician.She has developed recurrent back and leg pain consistent with neurogenic claudication. She has failed medical management and was worked up with lumbar x-rays and lumbar MRI. This demonstrated a spondylolisthesis with severe facet arthropathy at L4-5. I discussed the situation with the patient. We discussed the various treatment options. She has decided to proceed with surgery.  Preoperative diagnosis:l4-5 spondylolisthesis, facet arthropathyDegenerative disc disease, spinal stenosis compressing both the L4 and the L5 nerve roots; lumbago; lumbar radiculopathy  Postoperative diagnosis: the same  Procedure:bilateral L4-5Laminotomy/foraminotomies to decompress the bilateralL4 and L5nerve roots(the work required to do this was in addition to the work required to do the posterior lumbar interbody fusion because of the patient's spinal stenosis, facet arthropathy. Etc. requiring a wide decompression of the nerve roots.);L4-5 posterior lumbar interbody fusion with local morselized autograft bone and Kinnex graft extender; insertion of interbody prosthesis atl4-5(globus peekexpandable interbody prosthesis); posterior nonsegmental instrumentation fromL4 toL5with globus titanium pedicle screws and rods; posterior lateral arthrodesis atL4-5with local morselized autograft bone and Kinnex bone graft extender.  Surgeon: Dr. Earle Gell  Asst.:dr. Sherley Bounds  Anesthesia: Gen. endotracheal  Estimated blood loss:150 mL  Drains: none   Complications: None  Description of procedure: The patient was brought to the operating room by the anesthesia team. General endotracheal anesthesia was induced. The patient was turned to the prone position on the Wilson frame. The patient's lumbosacral region was then prepared with Betadine scrub and Betadine solution. Sterile drapes were  applied.  I then injected the area to be incised with Marcaine with epinephrine solution. I then used the scalpel to make a linear midline incision over theL4-5interspace. I then used electrocautery to perform a bilateral subperiosteal dissection exposing the spinous process and lamina ofL4 and L5. We then obtained intraoperative radiograph to confirm our location. We then inserted the Verstrac retractor to provide exposure.  I began the decompression by using the high speed drill to perform laminotomies atl4-5 bilaterally. We then used the Kerrison punches to widen the laminotomy and removed the ligamentum flavum atl4-5 bilaterally. We used the Kerrison punches to remove the medial facets atL4-5 bilaterally. We performed wide foraminotomies about the bilateralL4 and L5nerve roots completing the decompression.  We now turned our attention to the posterior lumbar interbody fusion. I used a scalpel to incise the intervertebral disc atl4-5. I then performed a partial intervertebral discectomy atl4-5 bilaterallyusing the pituitary forceps. We prepared the vertebral endplates atL4-37for the fusion by removing the soft tissues with the curettes. We then used the trial spacers to pick the appropriate sized interbody prosthesis. We prefilled his prosthesis with a combination of local morselized autograft bone that we obtained during the decompression as well as Kinnex bone graft extender. We inserted the prefilled prosthesis into the interspace atl4-5. There was a good snug fit of the prosthesis in the interspace. We then filled and the remainder of the intervertebral disc space with local morselized autograft bone and Kinnex. This completed the posterior lumbar interbody arthrodesis.  We now turned attention to the instrumentation. Under fluoroscopic guidance we cannulated the bilateralL4 and L5pedicles with the bone probe. We then removed the bone probe. We then tapped the pedicle with atics 0.40millimeter tap. We  then removed the tap. We probed inside the tapped pedicle with a ball probe to rule out cortical breaches. We then inserted a7.5 x 33millimeter pedicle screw into the L4 and L5pedicles bilaterally under fluoroscopic guidance.  We then palpated along the medial aspect of the pedicles to rule out cortical breaches. There were none. The nerve roots were not injured. We then connected the unilateral pedicle screws with a lordotic rod. We compressed the construct and secured the rod in place with the caps. We then tightened the caps appropriately. This completed the instrumentation fromL4-5.  We now turned our attention to the posterior lateral arthrodesis atl4-5. We used the high-speed drill to decorticate the remainder of the facets, pars, transverse process atL4-5. We then applied a combination of local morselized autograft bone and Kinnex bone graft extender over these decorticated posterior lateral structures. This completed the posterior lateral arthrodesis.  We then obtained hemostasis using bipolar electrocautery. We irrigated the wound out with bacitracin solution. We inspected the thecal sac and nerve roots and noted they were well decompressed. We then removed the retractor.e placed vancomycin powder in the wound.We reapproximated patient's thoracolumbar fascia with interrupted #1 Vicryl suture. We reapproximated patient's subcutaneous tissue with interrupted 2-0 Vicryl suture. The reapproximated patient's skin with Steri-Strips and benzoin. The wound was then coated with bacitracin ointment. A sterile dressing was applied. The drapes were removed. The patient was subsequently returned to the supine position where they were extubated by the anesthesia team. He was then transported to the post anesthesia care unit in stable condition. All sponge instrument and needle counts were reportedly correct at the end of this case.

## 2015-08-25 NOTE — Anesthesia Procedure Notes (Signed)
Procedure Name: Intubation Date/Time: 08/25/2015 7:30 AM Performed by: Merrilyn Puma B Pre-anesthesia Checklist: Patient identified, Emergency Drugs available, Timeout performed, Suction available and Patient being monitored Patient Re-evaluated:Patient Re-evaluated prior to inductionOxygen Delivery Method: Circle system utilized Preoxygenation: Pre-oxygenation with 100% oxygen Intubation Type: IV induction Ventilation: Mask ventilation without difficulty Laryngoscope Size: Mac and 3 Grade View: Grade II Tube type: Oral Tube size: 7.0 mm Number of attempts: 1 Airway Equipment and Method: Stylet Placement Confirmation: CO2 detector,  positive ETCO2,  ETT inserted through vocal cords under direct vision and breath sounds checked- equal and bilateral Secured at: 21 cm Tube secured with: Tape Dental Injury: Teeth and Oropharynx as per pre-operative assessment

## 2015-08-25 NOTE — Anesthesia Preprocedure Evaluation (Addendum)
Anesthesia Evaluation  Patient identified by MRN, date of birth, ID band Patient awake    Reviewed: Allergy & Precautions, NPO status , Patient's Chart, lab work & pertinent test results  Airway Mallampati: II  TM Distance: >3 FB Neck ROM: Full    Dental no notable dental hx.    Pulmonary sleep apnea and Continuous Positive Airway Pressure Ventilation , former smoker,    Pulmonary exam normal breath sounds clear to auscultation       Cardiovascular hypertension, Pt. on medications Normal cardiovascular exam Rhythm:Regular Rate:Normal  Abnormal stress test 3/17 led to a cardiac cath in 4/17. Results: normal. Clean vessels   Neuro/Psych Seizures -, Well Controlled,  negative psych ROS   GI/Hepatic negative GI ROS, Neg liver ROS,   Endo/Other  diabetes, Type 2, Oral Hypoglycemic AgentsMorbid obesity  Renal/GU negative Renal ROS  negative genitourinary   Musculoskeletal negative musculoskeletal ROS (+)   Abdominal   Peds negative pediatric ROS (+)  Hematology negative hematology ROS (+)   Anesthesia Other Findings   Reproductive/Obstetrics negative OB ROS                            Anesthesia Physical Anesthesia Plan  ASA: III  Anesthesia Plan: General   Post-op Pain Management:    Induction: Intravenous  Airway Management Planned: Oral ETT  Additional Equipment:   Intra-op Plan:   Post-operative Plan: Extubation in OR  Informed Consent: I have reviewed the patients History and Physical, chart, labs and discussed the procedure including the risks, benefits and alternatives for the proposed anesthesia with the patient or authorized representative who has indicated his/her understanding and acceptance.   Dental advisory given  Plan Discussed with: CRNA  Anesthesia Plan Comments:         Anesthesia Quick Evaluation

## 2015-08-25 NOTE — H&P (Signed)
Subjective: Patient is a 55 year old black female who has had prior surgery at L5-S1 by another physician. She has developed recurrent back and leg pain. She has failed medical management. She was worked up with a lumbar MRI and x-rays which demonstrated spinal stenosis at L4-5. I discussed the various treatment options with the patient including surgery. She has weighed the risks, benefits, and alternative surgery and decided to proceed with an L4-5 decompression, instrumentation, and fusion.  Past Medical History  Diagnosis Date  . Edema   . Heart palpitations June 2013    Event monitor showing sinus tachycardia, PACs with couplets and triplets.  . Morbid obesity ([place])   . Hypercholesterolemia   . Seizure disorder ([place])   . Migraine   . OSA (obstructive sleep apnea)     has been on continuous positive airway pressure  . Hx of echocardiogram     a. 10/2009 Echo: showed normal left ventricular function, mild left ventricular hypertrophy, and no significant valve abnormalities.  . Abnormal stress test     a. 10/2009: Normal myocardial perfusion imaging; b. 08/2015 MV: medium defect of mod severity in mid ant apical region w/ mild HK of the distal inf wall;  c. 08/2015 Cath: nl Cors, EF 60%.  . Essential hypertension   . Type II diabetes mellitus ([place])   . Depression   . Anxiety   . Headache     migraines, gets botox injections  . Anemia   . Arthritis   . Seizures ([place])   . Frequent urination at night   . Frequent urination   . Neuromuscular disorder [facility])     Past Surgical History  Procedure Laterality Date  . Cholecystectomy  1980s  . Abdominal hysterectomy  1990    without BSO  . Back surgery  1900s 2001    x2 with "cage put in"  . Knee surgery Right 2012    meniscus tear  . Abdominal hysterectomy  1990  . Cholecystectomy  1980  . Tubal ligation  1982  . Back surgery  2001  . Knee surgery  2012  . Laminectomy  1995  . Carpal tunnel release Bilateral   . Bilateral  salpingoophorectomy  1990  . Dorsal compartment release Left 10/25/2014    Procedure: LEFT FIRST  DORSAL COMPARTMENT RELEASE AND RADIAL TENOSYNOVECTOMY ;  Surgeon: Roseanne Kaufman, MD;  Location: [facility];  Service: Orthopedics;  Laterality: Left;  . Hand surgery    . Wrist surgery Right   . Cardiac catheterization N/A 08/06/2015    Procedure: Left Heart Cath and Coronary Angiography;  Surgeon: Troy Sine, MD;  Location: [facility];  Service: Cardiovascular;  Laterality: N/A;    Allergies  Allergen Reactions  . Morphine And Related Hives    Nausea and vomiting   . Oxycontin [Oxycodone Hcl] Swelling, Hives and Itching    Social History  Substance Use Topics  . Smoking status: Former Smoker    Types: Cigarettes    Quit date: 01/03/1979  . Smokeless tobacco: Never Used     Comment: quit in 1984  . Alcohol Use: No    Family History  Problem Relation Age of Onset  . Sarcoidosis Mother   . Seizures Mother   . Heart attack Mother   . Alzheimer's disease Maternal Grandmother   . Dementia Maternal Grandmother   . Hypertension Maternal Grandfather   . Diabetes Maternal Grandfather    Prior to Admission medications   Medication Sig Start Date End  Date Taking? Authorizing Provider  baclofen (LIORESAL) 10 MG tablet Take 1 tablet (10 mg total) by mouth 3 (three) times daily. Patient taking differently: Take 10 mg by mouth 3 (three) times daily as needed (Headaches).  04/10/15  Yes Dennis E Chrismon, PA  fluocinonide ointment (LIDEX) [id number] % Apply 1 application topically 2 (two) times daily. Patient taking differently: Apply 1 application topically 2 (two) times daily as needed.  04/10/15  Yes Dennis E Chrismon, PA  furosemide (LASIX) 20 MG tablet Take 1 tablet (20 mg total) by mouth daily as needed for fluid or edema. Patient taking differently: Take 20 mg by mouth daily.  07/22/15  Yes Brett Canales, PA-C  HYDROcodone-acetaminophen (NORCO/VICODIN) 5-325 MG tablet  Take 1 tablet by mouth every 6 (six) hours as needed for moderate pain.   Yes Historical Provider, MD  lamoTRIgine (LAMICTAL) 100 MG tablet Take 100 mg by mouth 2 (two) times daily.   Yes Historical Provider, MD  lithium 300 MG tablet Take 2 tablets (600 mg total) by mouth at bedtime. 08/11/15  Yes Himabindu Ravi, MD  metFORMIN (GLUCOPHAGE) 500 MG tablet TAKE 2 TABLETS IN THE MORNING, 1 TABLET AT LUNCH TIME AND 2 TABLETS IN THE EVENING 04/15/15  Yes Dennis E Chrismon, PA  metoprolol (LOPRESSOR) 50 MG tablet Take 1 tablet (50 mg total) by mouth 2 (two) times daily. 02/13/15  Yes Troy Sine, MD  potassium chloride (MICRO-K) 10 MEQ CR capsule Take 1 capsule (10 mEq total) by mouth daily as needed. Patient taking differently: Take 10 mEq by mouth daily.  07/22/15  Yes Einar Pheasant Hager, PA-C  TEGRETOL-XR 400 MG 12 hr tablet TAKE 1 TABLET BY MOUTH TWICE DAILY 05/08/15  Yes Vickki Muff Chrismon, PA  Vilazodone HCl (VIIBRYD) 40 MG TABS Take 1 tablet (40 mg total) by mouth daily. 08/11/15  Yes Himabindu Ravi, MD  VYTORIN 10-40 MG tablet TAKE 1 TABLET BY MOUTH AT BEDTIME 03/04/15  Yes Troy Sine, MD  BOTOX 100 UNITS SOLR injection Inject into the muscle every 3 (three) months.  11/01/12   Historical Provider, MD  chlorproMAZINE (THORAZINE) 25 MG tablet Take 25 mg by mouth 3 (three) times daily as needed (migraines).     Historical Provider, MD     Review of Systems  Positive ROS: As above  All other systems have been reviewed and were otherwise negative with the exception of those mentioned in the HPI and as above.  Objective: Vital signs in last 24 hours: Temp:  [97.7 F (36.5 C)] 97.7 F (36.5 C) (04/24 0635) Pulse Rate:  [67] 67 (04/24 0635) Resp:  [18] 18 (04/24 0635) BP: (127)/(68) 127/68 mmHg (04/24 0635) SpO2:  [100 %] 100 % (04/24 0635) Weight:  [100.245 kg (221 lb)] 100.245 kg (221 lb) (04/24 0635)  General Appearance: Alert, cooperative, no distress, Head: Normocephalic, without obvious  abnormality, atraumatic Eyes: PERRL, conjunctiva/corneas clear, EOM's intact,    Ears: Normal  Throat: Normal  Neck: Supple, symmetrical, trachea midline, no adenopathy; thyroid: No enlargement/tenderness/nodules; no carotid bruit or JVD Back: Symmetric, no curvature, ROM normal, no CVA tenderness. The patient's lumbar incision is well-healed. Lungs: Clear to auscultation bilaterally, respirations unlabored Heart: Regular rate and rhythm, no murmur, rub or gallop Abdomen: Soft, non-tender,, no masses, no organomegaly Extremities: Extremities normal, atraumatic, no cyanosis or edema Pulses: 2+ and symmetric all extremities Skin: Skin color, texture, turgor normal, no rashes or lesions  NEUROLOGIC:   Mental status: alert and oriented, no aphasia, good attention  span, Fund of knowledge/ memory ok Motor Exam - grossly normal Sensory Exam - grossly normal Reflexes:  Coordination - grossly normal Gait - grossly normal Balance - grossly normal Cranial Nerves: I: smell Not tested  II: visual acuity  OS: Normal  OD: Normal   II: visual fields Full to confrontation  II: pupils Equal, round, reactive to light  III,VII: ptosis None  III,IV,VI: extraocular muscles  Full ROM  V: mastication Normal  V: facial light touch sensation  Normal  V,VII: corneal reflex  Present  VII: facial muscle function - upper  Normal  VII: facial muscle function - lower Normal  VIII: hearing Not tested  IX: soft palate elevation  Normal  IX,X: gag reflex Present  XI: trapezius strength  5/5  XI: sternocleidomastoid strength 5/5  XI: neck flexion strength  5/5  XII: tongue strength  Normal    Data Review Lab Results  Component Value Date   WBC 5.9 08/22/2015   HGB 10.6* 08/22/2015   HCT 34.4* 08/22/2015   MCV 86.4 08/22/2015   PLT 252 08/22/2015   Lab Results  Component Value Date   NA 140 08/22/2015   K 4.2 08/22/2015   CL 109 08/22/2015   CO2 22 08/22/2015   BUN 13 08/22/2015   CREATININE  1.25* 08/22/2015   GLUCOSE 208* 08/22/2015   Lab Results  Component Value Date   INR 1.01 07/30/2015    Assessment/Plan: L4-5 spondylolisthesis, spinal stenosis, lumbago, lumbar radiculopathy, neurogenic claudication: I have discussed the situation with the patient. I have reviewed her imaging studies with her and pointed out the abnormalities. We have discussed the various treatment options including surgery. I have described the surgical treatment option of an L4-5 decompression, instrumentation, and fusion. I have shown her surgical models. We have discussed the risks, benefits, and alternatives to surgery. I have answered all her questions. She has decided to proceed with surgery.   Josephus Harriger D 08/25/2015 7:19 AM

## 2015-08-25 NOTE — Anesthesia Postprocedure Evaluation (Signed)
Anesthesia Post Note  Patient: Abigail Figueroa  Procedure(s) Performed: Procedure(s) (LRB): Lumbar four - five decompression with  posterior lumbar interbody fusion with interbody prosthesis posterior lateral arthrodesis and posterior nonsegmental instrumentation (N/A)  Patient location during evaluation: PACU Anesthesia Type: General Level of consciousness: awake and alert Pain management: pain level controlled Vital Signs Assessment: post-procedure vital signs reviewed and stable Respiratory status: spontaneous breathing, nonlabored ventilation, respiratory function stable and patient connected to nasal cannula oxygen Cardiovascular status: blood pressure returned to baseline and stable Postop Assessment: no signs of nausea or vomiting Anesthetic complications: no    Last Vitals:  Filed Vitals:   08/25/15 1230 08/25/15 1245  BP: 151/82   Pulse: 88   Temp:    Resp: 20 18    Last Pain:  Filed Vitals:   08/25/15 1309  PainSc: 10-Worst pain ever                 Montez Hageman

## 2015-08-26 LAB — GLUCOSE, CAPILLARY
GLUCOSE-CAPILLARY: 223 mg/dL — AB (ref 65–99)
GLUCOSE-CAPILLARY: 221 mg/dL — AB (ref 65–99)
Glucose-Capillary: 248 mg/dL — ABNORMAL HIGH (ref 65–99)
Glucose-Capillary: 257 mg/dL — ABNORMAL HIGH (ref 65–99)

## 2015-08-26 MED FILL — Sodium Chloride IV Soln 0.9%: INTRAVENOUS | Qty: 1000 | Status: AC

## 2015-08-26 MED FILL — Heparin Sodium (Porcine) Inj 1000 Unit/ML: INTRAMUSCULAR | Qty: 30 | Status: AC

## 2015-08-26 NOTE — Evaluation (Signed)
Physical Therapy Evaluation Patient Details Name: Abigail Figueroa MRN: [id number] DOB: Nov 16, 1960 Today's Date: 08/26/2015   History of Present Illness  Pt is a 55 y/o female who presents s/p L4-L5 PLIF on 08/25/15.   Clinical Impression  Pt admitted with above diagnosis. Pt currently with functional limitations due to the deficits listed below (see PT Problem List). At the time of PT eval pt was able to perform transfers and ambulation with min guard to min assist for balance support and safety. Pt will benefit from skilled PT to increase their independence and safety with mobility to allow discharge to the venue listed below.       Follow Up Recommendations Home health PT;Supervision for mobility/OOB    Equipment Recommendations  Rolling walker with 5" wheels;3in1 (PT)    Recommendations for Other Services       Precautions / Restrictions Precautions Precautions: Fall;Back Precaution Booklet Issued: Yes (comment) Precaution Comments: Reviewed handout, and pt was cued for precautions during functional mobility.  Required Braces or Orthoses: Spinal Brace Spinal Brace: Lumbar corset;Applied in sitting position Restrictions Weight Bearing Restrictions: No      Mobility  Bed Mobility Overal bed mobility: Needs Assistance Bed Mobility: Sidelying to Sit   Sidelying to sit: Min guard       General bed mobility comments: Pt already in S/L when PT arrived. Pt was able to transition to full sitting position with heavy use of rails and increased time. Close guard for safety.   Transfers Overall transfer level: Needs assistance Equipment used: Rolling walker (2 wheeled) Transfers: Sit to/from Stand Sit to Stand: Min guard         General transfer comment: Pt was able to power-up to full standing position with hands-on guarding for safety. VC's for hand placement on seated surface and increased time required.   Ambulation/Gait Ambulation/Gait assistance: Min guard;Min  assist Ambulation Distance (Feet): 150 Feet Assistive device: Rolling walker (2 wheeled) Gait Pattern/deviations: Step-through pattern;Decreased stride length;Trunk flexed;Narrow base of support Gait velocity: Decreased Gait velocity interpretation: Below normal speed for age/gender General Gait Details: Very slow and guarded. Pt was mostly min guard assist but had occasional LOB which required min A to recover. Pt closing eyes intermittently and required cueing to open.   Stairs            Wheelchair Mobility    Modified Rankin (Stroke Patients Only)       Balance Overall balance assessment: Needs assistance Sitting-balance support: Feet supported;No upper extremity supported Sitting balance-Leahy Scale: Fair     Standing balance support: No upper extremity supported;During functional activity Standing balance-Leahy Scale: Fair Standing balance comment: At EOB or edge of chair prior to stand>sit.                              Pertinent Vitals/Pain Pain Assessment: Faces Faces Pain Scale: Hurts even more Pain Location: Back Pain Descriptors / Indicators: Operative site guarding;Sore Pain Intervention(s): Limited activity within patient's tolerance;Monitored during session;Repositioned    Home Living Family/patient expects to be discharged to:: Private residence Living Arrangements: Spouse/significant other Available Help at Discharge: Family;Available 24 hours/day Type of Home: House Home Access: Stairs to enter Entrance Stairs-Rails: None (Post on porch to hold to) [employer] of Steps: 2 Home Layout: One level Home Equipment: Walker - 4 wheels      Prior Function Level of Independence: Independent with assistive device(s)  Comments: Pt using rollator PTA     Hand Dominance        Extremity/Trunk Assessment   Upper Extremity Assessment: Defer to OT evaluation           Lower Extremity Assessment: Generalized  weakness      Cervical / Trunk Assessment: Normal (Lumbar surgical incision)  Communication   Communication: No difficulties  Cognition Arousal/Alertness: Lethargic;Suspect due to medications Behavior During Therapy: [facility] for tasks assessed/performed Overall Cognitive Status: Within Functional Limits for tasks assessed                      General Comments      Exercises        Assessment/Plan    PT Assessment Patient needs continued PT services  PT Diagnosis Difficulty walking;Acute pain   PT Problem List Decreased strength;Decreased range of motion;Decreased activity tolerance;Decreased balance;Decreased mobility;Decreased knowledge of use of DME;Decreased safety awareness;Decreased knowledge of precautions;Pain  PT Treatment Interventions DME instruction;Gait training;Stair training;Functional mobility training;Therapeutic activities;Therapeutic exercise;Neuromuscular re-education;Patient/family education   PT Goals (Current goals can be found in the Care Plan section) Acute Rehab PT Goals Patient Stated Goal: Pain control PT Goal Formulation: With patient/family Time For Goal Achievement: 09/02/15 Potential to Achieve Goals: Good    Frequency Min 5X/week   Barriers to discharge        Co-evaluation               End of Session Equipment Utilized During Treatment: Back brace Activity Tolerance: Patient limited by fatigue;Patient limited by lethargy Patient left: in chair;with call bell/phone within reach;with family/visitor present Nurse Communication: Mobility status;Patient requests pain meds         Time: 0750-0826 PT Time Calculation (min) (ACUTE ONLY): 36 min   Charges:   PT Evaluation $PT Eval Moderate Complexity: 1 Procedure PT Treatments $Gait Training: 8-22 mins   PT G Codes:        Rolinda Roan 09/25/15, 8:48 AM   Rolinda Roan, PT, DPT Acute Rehabilitation Services Pager: [phone]

## 2015-08-26 NOTE — Progress Notes (Signed)
Patient ID: Abigail Figueroa, female   DOB: 11/03/1960, 55 y.o.   MRN: [id number] Subjective:  The patient is alert and pleasant. Her back is appropriately sore. She looks well.  Objective: Vital signs in last 24 hours: Temp:  [96.4 F (35.8 C)-99.5 F (37.5 C)] 99.1 F (37.3 C) (04/25 0745) Pulse Rate:  [70-102] 84 (04/25 0745) Resp:  [0-20] 18 (04/25 0745) BP: (106-165)/(51-95) 127/51 mmHg (04/25 0745) SpO2:  [94 %-100 %] 98 % (04/25 0745)  Intake/Output from previous day: 04/24 0701 - 04/25 0700 In: 2330 [P.O.:480; I.V.:1850] Out: 3315 [Urine:3215; Blood:100] Intake/Output this shift:    Physical exam the patient is alert and pleasant. Her dressing is in tatters. She is moving her lower extremities well.  Lab Results: No results for input(s): WBC, HGB, HCT, PLT in the last 72 hours. BMET No results for input(s): NA, K, CL, CO2, GLUCOSE, BUN, CREATININE, CALCIUM in the last 72 hours.  Studies/Results: Dg Lumbar Spine 2-3 Views  08/25/2015  CLINICAL DATA:  L4-5 decompression. EXAM: LUMBAR SPINE - 2-3 VIEW COMPARISON:  Intraoperative radiographs of earlier today. FINDINGS: AP and lateral views. These demonstrate trans pedicle screw fixation L4-5 with interbody fusion material. Prior L5-S1 fixation as well. IMPRESSION: Intraoperative imaging. Electronically Signed   By: Abigail Miyamoto M.D.   On: 08/25/2015 11:53   Dg Lumbar Spine 1 View  08/25/2015  CLINICAL DATA:  55 year old female undergoing L4-L5 posterior lumbar interbody fusion EXAM: LUMBAR SPINE - 1 VIEW COMPARISON:  Prior radiographs 06/26/2015 FINDINGS: Single cross-table lateral intraoperative image is submitted. Soft tissue retractors are present posterior to the L4 spinous process. A surgical packing sponge is also present in the posterior soft tissues. A metallic probe overlies the L4-L5 facet joint. Prior surgical changes of L5-S1 interbody cage. IMPRESSION: Intraoperative localization radiographs as above. Electronically  Signed   By: Jacqulynn Cadet M.D.   On: 08/25/2015 08:59   Dg C-arm 1-60 Min  08/25/2015  CLINICAL DATA:  Lumbar surgery. EXAM: DG C-ARM 61-120 MIN COMPARISON:  08/25/2015. FINDINGS: Lumbar vertebra numbered as per prior exam. Pedicle screws are noted in L4 and L5. Inter disc fusion L4-L5 and L5-S1. No acute bony abnormality . IMPRESSION: Postsurgical changes lumbar spine. Electronically Signed   By: Marcello Moores  Register   On: 08/25/2015 11:50    Assessment/Plan: Postop day #1: The patient is doing well. We will mobilize her with PT. She will likely go home tomorrow.  LOS: 1 day     Ruby Logiudice D 08/26/2015, 7:51 AM

## 2015-08-26 NOTE — Progress Notes (Addendum)
Inpatient Diabetes Program Recommendations  AACE/ADA: New Consensus Statement on Inpatient Glycemic Control (2015)  Target Ranges:  Prepandial:   less than 140 mg/dL      Peak postprandial:   less than 180 mg/dL (1-2 hours)      Critically ill patients:  140 - 180 mg/dL   Review of Glycemic Control:  Results for Abigail Figueroa, Abigail Figueroa (MRN [id number]) as of 08/26/2015 12:01  Ref. Range 08/25/2015 06:33 08/25/2015 11:24 08/25/2015 15:56 08/25/2015 21:18 08/26/2015 07:39  Glucose-Capillary Latest Ref Range: 65-99 mg/dL 226 (H) 150 (H) 246 (H) 189 (H) 248 (H)   Results for Abigail Figueroa, Abigail Figueroa (MRN [id number]) as of 08/26/2015 12:01  Ref. Range 07/21/2015 09:33  Hemoglobin A1C Latest Ref Range: 4.8-5.6 % 9.2 (H)   Diabetes history:  Type 2 diabetes Outpatient Diabetes medications: Metformin 500 mg q AM and 1000 mg q PM Current orders for Inpatient glycemic control:  Metformin 500 mg bid  Inpatient Diabetes Program Recommendations:   May consider adding Novolog moderate tid with meals and HS.  Note that A1C is elevated greater than goal.  Will need follow-up with PCP regarding elevated A1C.    Thanks, Adah Perl, RN, BC-ADM Inpatient Diabetes Coordinator Pager [phone] (8a-5p)

## 2015-08-27 LAB — GLUCOSE, CAPILLARY
GLUCOSE-CAPILLARY: 218 mg/dL — AB (ref 65–99)
GLUCOSE-CAPILLARY: 224 mg/dL — AB (ref 65–99)
Glucose-Capillary: 198 mg/dL — ABNORMAL HIGH (ref 65–99)
Glucose-Capillary: 191 mg/dL — ABNORMAL HIGH (ref 65–99)

## 2015-08-27 NOTE — Progress Notes (Signed)
Patient ID: Abigail Figueroa, female   DOB: 08/18/1960, 55 y.o.   MRN: [id number] Subjective:  The patient is alert and pleasant. She wants to go home tomorrow.  Objective: Vital signs in last 24 hours: Temp:  [98.3 F (36.8 C)-99.3 F (37.4 C)] 99.3 F (37.4 C) (04/26 1217) Pulse Rate:  [80-89] 81 (04/26 1217) Resp:  [18-20] 18 (04/26 1217) BP: (113-168)/(48-87) 114/48 mmHg (04/26 1217) SpO2:  [94 %-98 %] 98 % (04/26 1217)  Intake/Output from previous day: 04/25 0701 - 04/26 0700 In: 240 [P.O.:240] Out: 350 [Urine:350] Intake/Output this shift: Total I/O In: 480 [P.O.:480] Out: -   Physical exam the patient is alert and pleasant. She is moving her lower extremities well.  Lab Results: No results for input(s): WBC, HGB, HCT, PLT in the last 72 hours. BMET No results for input(s): NA, K, CL, CO2, GLUCOSE, BUN, CREATININE, CALCIUM in the last 72 hours.  Studies/Results: No results found.  Assessment/Plan: Postop day #2: The patient will likely go home tomorrow. We will continue mobilizer her with PT. I spoke with her husband.  LOS: 2 days     Abigail Figueroa D 08/27/2015, 1:23 PM

## 2015-08-27 NOTE — Care Management Note (Signed)
Case Management Note  Patient Details  Name: Abigail Figueroa MRN: [id number] Date of Birth: 1960-08-14  Subjective/Objective:   55 yr old female s/p Bilateral L4-L5 laminectomies/foramintomies with decompression.                  Action/Plan:  Case manager spoke with patient at the bedside concerning home health and DME needs. Choice was offered. Referral was called to [facility], [facility]. Patient will have family support at discharge.    Expected Discharge Date:   08/27/15               Expected Discharge Plan:  [place]  In-House Referral:     Discharge planning Services  CM Consult  Post Acute Care Choice:  Durable Medical Equipment, Home Health Choice offered to:  Patient  DME Arranged:  3-N-1, Walker rolling DME Agency:  [place]:  PT, Nurse's Aide [place] Agency:  [place]  Status of Service:  Completed, signed off  Medicare Important Message Given:    Date Medicare IM Given:    Medicare IM give by:    Date Additional Medicare IM Given:    Additional Medicare Important Message give by:     If discussed at [place] of Stay Meetings, dates discussed:    Additional Comments:  Ninfa Meeker, RN 08/27/2015, 3:00 PM

## 2015-08-27 NOTE — Progress Notes (Signed)
Physical Therapy Treatment Patient Details Name: Abigail Figueroa MRN: [id number] DOB: 1960/05/17 Today's Date: 08/27/2015    History of Present Illness Pt is a 55 y/o female who presents s/p L4-L5 PLIF on 08/25/15.     PT Comments    Pt progressing towards physical therapy goals. Was able to perform transfers and ambulation with min guard to min assist for balance support and safety. Pt continues to be very lethargic and intermittently falls asleep during mobility causing LOB and requiring assist to recover. Pt's husband was present during session and educated on safety at home.   Follow Up Recommendations  Home health PT;Supervision for mobility/OOB     Equipment Recommendations  Rolling walker with 5" wheels;3in1 (PT)    Recommendations for Other Services       Precautions / Restrictions Precautions Precautions: Fall;Back Precaution Booklet Issued: Yes (comment) Precaution Comments: Reviewed handout, and pt was cued for precautions during functional mobility.  Required Braces or Orthoses: Spinal Brace Spinal Brace: Lumbar corset;Applied in sitting position Restrictions Weight Bearing Restrictions: No    Mobility  Bed Mobility Overal bed mobility: Needs Assistance Bed Mobility: Sidelying to Sit   Sidelying to sit: Min guard       General bed mobility comments: Pt already in S/L when PT arrived. Pt was able to transition to full sitting position with rails lowered and increased time. Close guard for safety.   Transfers Overall transfer level: Needs assistance Equipment used: Rolling walker (2 wheeled) Transfers: Sit to/from Stand Sit to Stand: Min guard         General transfer comment: Pt was able to power-up to full standing position with hands-on guarding for safety. VC's for hand placement on seated surface and significant increased time required for pt to initiate transfer.  Ambulation/Gait Ambulation/Gait assistance: Min guard;Min assist Ambulation  Distance (Feet): 150 Feet Assistive device: Rolling walker (2 wheeled) Gait Pattern/deviations: Step-through pattern;Decreased stride length Gait velocity: Decreased Gait velocity interpretation: Below normal speed for age/gender General Gait Details: Very slow and guarded. Pt was mostly min guard assist but had occasional LOB which required min A to recover (due to falling asleep while walking). Pt closing eyes intermittently and required cueing to open.    Stairs Stairs: Yes Stairs assistance: +2 safety/equipment Stair Management: No rails;Forwards;Step to pattern Number of Stairs: 3 General stair comments: Husband present and educated in safety with stair negotiation. Pt was able to complete 1 step ascending and descending x3 with HHA.   Wheelchair Mobility    Modified Rankin (Stroke Patients Only)       Balance Overall balance assessment: Needs assistance Sitting-balance support: Feet supported;No upper extremity supported Sitting balance-Leahy Scale: Fair     Standing balance support: During functional activity Standing balance-Leahy Scale: Poor Standing balance comment: LOB due to falling asleep during functional mobility.                     Cognition Arousal/Alertness: Lethargic;Suspect due to medications Behavior During Therapy: [facility] for tasks assessed/performed Overall Cognitive Status: Within Functional Limits for tasks assessed                      Exercises      General Comments        Pertinent Vitals/Pain Pain Assessment: Faces Faces Pain Scale: Hurts little more Pain Location: back Pain Descriptors / Indicators: Operative site guarding;Sore Pain Intervention(s): Limited activity within patient's tolerance;Monitored during session;Repositioned    Home Living  Prior Function            PT Goals (current goals can now be found in the care plan section) Acute Rehab PT Goals Patient Stated Goal: Pain  control PT Goal Formulation: With patient/family Time For Goal Achievement: 09/02/15 Potential to Achieve Goals: Good Progress towards PT goals: Progressing toward goals (Slowly)    Frequency  Min 5X/week    PT Plan Current plan remains appropriate    Co-evaluation             End of Session Equipment Utilized During Treatment: Back brace Activity Tolerance: Patient limited by fatigue;Patient limited by lethargy Patient left: in chair;with call bell/phone within reach;with family/visitor present     Time: [id number] PT Time Calculation (min) (ACUTE ONLY): 25 min  Charges:  $Gait Training: 23-37 mins                    G Codes:      Rolinda Roan 09-21-15, 9:53 AM   Rolinda Roan, PT, DPT Acute Rehabilitation Services Pager: [phone]

## 2015-08-28 LAB — GLUCOSE, CAPILLARY: Glucose-Capillary: 194 mg/dL — ABNORMAL HIGH (ref 65–99)

## 2015-08-28 MED ORDER — DOCUSATE SODIUM 100 MG PO CAPS: 100.0000 mg | ORAL_CAPSULE | Freq: Two times a day (BID) | ORAL | Status: DC

## 2015-08-28 MED ORDER — CEPHALEXIN 500 MG PO CAPS: 500.0000 mg | ORAL_CAPSULE | Freq: Four times a day (QID) | ORAL | Status: DC

## 2015-08-28 MED ORDER — HYDROMORPHONE HCL 2 MG PO TABS: 2.0000 mg | ORAL_TABLET | ORAL | Status: DC | PRN

## 2015-08-28 MED ORDER — CEPHALEXIN 500 MG PO CAPS
500.0000 mg | ORAL_CAPSULE | Freq: Four times a day (QID) | ORAL | Status: DC
  Filled 2015-08-28 (×2): qty 1

## 2015-08-28 NOTE — Progress Notes (Signed)
Patient alert and oriented, mae's well, voiding adequate amount of urine, swallowing without difficulty, no c/o pain. Patient discharged home with family. Script and discharged instructions given to patient. Patient and family stated understanding of d/c instructions given and has an appointment with MD. 

## 2015-08-28 NOTE — Discharge Summary (Signed)
Physician Discharge Summary  Patient ID: Abigail Figueroa MRN: [id number] DOB/AGE: [birth year] 55 y.o.  Admit date: 08/25/2015 Discharge date: 08/28/2015  Admission Diagnoses:L4-5 spondylolisthesis, facet arthropathy, lumbago, lumbar radiculopathy  Discharge Diagnoses: The same Active Problems:   Spondylolisthesis of lumbar region   Discharged Condition: good  Hospital Course: I performed an L4-5 decompression, instrumentation, and fusion on the patient on 08/25/2015. The surgery went well.  The patient's postoperative course was unremarkable. On postoperative day #3 the patient, and her husband, requested discharge to home. They were given written and oral discharge instructions. All the questions were answered.  Consults: Physical therapy Significant Diagnostic Studies: None Treatments: L4-5 decompression, instrumentation, and fusion. Discharge Exam: Blood pressure 149/67, pulse 97, temperature 99.6 F (37.6 C), temperature source Oral, resp. rate 18, height 5\' 3"  (1.6 m), weight 100.245 kg (221 lb), SpO2 94 %. The patient is alert and pleasant. Her strength is normal in her lower extremities. She has some mild bloody discharge on her dressing.  Disposition: Home  Discharge Instructions    Call MD for:  difficulty breathing, headache or visual disturbances    Complete by:  As directed      Call MD for:  extreme fatigue    Complete by:  As directed      Call MD for:  hives    Complete by:  As directed      Call MD for:  persistant dizziness or light-headedness    Complete by:  As directed      Call MD for:  persistant nausea and vomiting    Complete by:  As directed      Call MD for:  redness, tenderness, or signs of infection (pain, swelling, redness, odor or green/yellow discharge around incision site)    Complete by:  As directed      Call MD for:  severe uncontrolled pain    Complete by:  As directed      Call MD for:  temperature >100.4    Complete by:  As directed       Diet - low sodium heart healthy    Complete by:  As directed      Discharge instructions    Complete by:  As directed   Call [phone] for a followup appointment. Take a stool softener while you are using pain medications.     Driving Restrictions    Complete by:  As directed   Do not drive for 2 weeks.     Increase activity slowly    Complete by:  As directed      Lifting restrictions    Complete by:  As directed   Do not lift more than 5 pounds. No excessive bending or twisting.     May shower / Bathe    Complete by:  As directed   He may shower after the pain she is removed 3 days after surgery. Leave the incision alone.     Remove dressing in 48 hours    Complete by:  As directed   Your stitches are under the scan and will dissolve by themselves. The Steri-Strips will fall off after you take a few showers. Do not rub back or pick at the wound, Leave the wound alone.            Medication List    STOP taking these medications        HYDROcodone-acetaminophen 5-325 MG tablet  Commonly known as:  NORCO/VICODIN      TAKE these  medications        baclofen 10 MG tablet  Commonly known as:  LIORESAL  Take 1 tablet (10 mg total) by mouth 3 (three) times daily.     BOTOX 100 units Solr injection  Generic drug:  botulinum toxin Type A  Inject into the muscle every 3 (three) months.     chlorproMAZINE 25 MG tablet  Commonly known as:  THORAZINE  Take 25 mg by mouth 3 (three) times daily as needed (migraines).     docusate sodium 100 MG capsule  Commonly known as:  COLACE  Take 1 capsule (100 mg total) by mouth 2 (two) times daily.     fluocinonide ointment 0.05 %  Commonly known as:  LIDEX  Apply 1 application topically 2 (two) times daily.     furosemide 20 MG tablet  Commonly known as:  LASIX  Take 1 tablet (20 mg total) by mouth daily as needed for fluid or edema.     HYDROmorphone 2 MG tablet  Commonly known as:  DILAUDID  Take 1 tablet (2 mg total)  by mouth every 4 (four) hours as needed for moderate pain or severe pain.     lamoTRIgine 100 MG tablet  Commonly known as:  LAMICTAL  Take 100 mg by mouth 2 (two) times daily.     lithium 300 MG tablet  Take 2 tablets (600 mg total) by mouth at bedtime.     metFORMIN 500 MG tablet  Commonly known as:  GLUCOPHAGE  TAKE 2 TABLETS IN THE MORNING, 1 TABLET AT LUNCH TIME AND 2 TABLETS IN THE EVENING     metoprolol 50 MG tablet  Commonly known as:  LOPRESSOR  Take 1 tablet (50 mg total) by mouth 2 (two) times daily.     potassium chloride 10 MEQ CR capsule  Commonly known as:  MICRO-K  Take 1 capsule (10 mEq total) by mouth daily as needed.     TEGRETOL-XR 400 MG 12 hr tablet  Generic drug:  carbamazepine  TAKE 1 TABLET BY MOUTH TWICE DAILY     Vilazodone HCl 40 MG Tabs  Commonly known as:  VIIBRYD  Take 1 tablet (40 mg total) by mouth daily.     VYTORIN 10-40 MG tablet  Generic drug:  ezetimibe-simvastatin  TAKE 1 TABLET BY MOUTH AT BEDTIME           Follow-up Information    Follow up with [facility].   Why:  Someone from [facility] will contact you concerning start date and time for therapy.   Contact information:   [address] [place] [address] [phone]       Signed: Ophelia Charter 08/28/2015, 8:54 AM

## 2015-08-28 NOTE — Progress Notes (Signed)
Physical Therapy Treatment Patient Details Name: Abigail Figueroa MRN: [id number] DOB: 1960/07/04 Today's Date: 08/28/2015    History of Present Illness Pt is a 55 y/o female who presents s/p L4-L5 PLIF on 08/25/15.     PT Comments    Pt progressing towards physical therapy goals. Was able to perform transfers and ambulation with min guard assist for safety. Pt more alert this session and was able to participate in education for precautions. Increased cues required for teach-back. Pt and husband anticipate d/c home today.   Follow Up Recommendations  Home health PT;Supervision for mobility/OOB     Equipment Recommendations  Rolling walker with 5" wheels;3in1 (PT)    Recommendations for Other Services       Precautions / Restrictions Precautions Precautions: Fall;Back Precaution Booklet Issued: Yes (comment) Precaution Comments: Reviewed handout, and pt was cued for precautions during functional mobility.  Required Braces or Orthoses: Spinal Brace Spinal Brace: Lumbar corset;Applied in sitting position Restrictions Weight Bearing Restrictions: No    Mobility  Bed Mobility Overal bed mobility: Needs Assistance Bed Mobility: Rolling;Sidelying to Sit Rolling: Min guard Sidelying to sit: Min guard       General bed mobility comments: Pt cued to get up on R side of bed but was attempting to roll to the L side to get feet over to the R. Pt cued for proper sequencing and technique however was not following cues. Pt ended up sitting up on L side of bed.   Transfers Overall transfer level: Needs assistance Equipment used: Rolling walker (2 wheeled) Transfers: Sit to/from Stand Sit to Stand: Min guard         General transfer comment: Hands-on assist for tactile cue to initiate transfer to standing. Increased time required.   Ambulation/Gait Ambulation/Gait assistance: Min guard Ambulation Distance (Feet): 225 Feet Assistive device: Rolling walker (2 wheeled) Gait  Pattern/deviations: Step-through pattern;Decreased stride length;Trunk flexed Gait velocity: Decreased Gait velocity interpretation: Below normal speed for age/gender General Gait Details: Slow and guarded. Pt more alert today and no significant LOB noted. Hands-on guarding provided for safety.    Stairs         General stair comments: Pt and husband declined stair training today.   Wheelchair Mobility    Modified Rankin (Stroke Patients Only)       Balance Overall balance assessment: Needs assistance Sitting-balance support: Feet supported;No upper extremity supported Sitting balance-Leahy Scale: Fair     Standing balance support: No upper extremity supported;During functional activity Standing balance-Leahy Scale: Fair Standing balance comment: Standing edge of chair before sitting.                     Cognition Arousal/Alertness: Lethargic;Suspect due to medications Behavior During Therapy: [facility] for tasks assessed/performed Overall Cognitive Status: Within Functional Limits for tasks assessed                      Exercises      General Comments        Pertinent Vitals/Pain Pain Assessment: Faces Faces Pain Scale: Hurts little more Pain Location: back Pain Descriptors / Indicators: Operative site guarding;Sore Pain Intervention(s): Limited activity within patient's tolerance;Monitored during session;Repositioned    Home Living                      Prior Function            PT Goals (current goals can now be found in the care plan section)  Acute Rehab PT Goals Patient Stated Goal: Pain control PT Goal Formulation: With patient/family Time For Goal Achievement: 09/02/15 Potential to Achieve Goals: Good Progress towards PT goals: Progressing toward goals    Frequency  Min 5X/week    PT Plan Current plan remains appropriate    Co-evaluation             End of Session Equipment Utilized During Treatment: Back  brace Activity Tolerance: Patient limited by fatigue;Patient limited by lethargy Patient left: in chair;with call bell/phone within reach;with family/visitor present     Time: 0742-0810 PT Time Calculation (min) (ACUTE ONLY): 28 min  Charges:  $Gait Training: 23-37 mins                    G Codes:      Rolinda Roan Sep 17, 2015, 8:45 AM   Rolinda Roan, PT, DPT Acute Rehabilitation Services Pager: [phone]

## 2015-08-29 DIAGNOSIS — G40909 Epilepsy, unspecified, not intractable, without status epilepticus: Secondary | ICD-10-CM | POA: Diagnosis not present

## 2015-08-29 DIAGNOSIS — F329 Major depressive disorder, single episode, unspecified: Secondary | ICD-10-CM | POA: Diagnosis not present

## 2015-08-29 DIAGNOSIS — E119 Type 2 diabetes mellitus without complications: Secondary | ICD-10-CM | POA: Diagnosis not present

## 2015-08-29 DIAGNOSIS — Z4789 Encounter for other orthopedic aftercare: Secondary | ICD-10-CM | POA: Diagnosis not present

## 2015-08-29 DIAGNOSIS — I10 Essential (primary) hypertension: Secondary | ICD-10-CM | POA: Diagnosis not present

## 2015-08-29 DIAGNOSIS — F419 Anxiety disorder, unspecified: Secondary | ICD-10-CM | POA: Diagnosis not present

## 2015-08-29 DIAGNOSIS — G709 Myoneural disorder, unspecified: Secondary | ICD-10-CM | POA: Diagnosis not present

## 2015-08-29 DIAGNOSIS — M5416 Radiculopathy, lumbar region: Secondary | ICD-10-CM | POA: Diagnosis not present

## 2015-08-29 DIAGNOSIS — M4806 Spinal stenosis, lumbar region: Secondary | ICD-10-CM | POA: Diagnosis not present

## 2015-08-29 DIAGNOSIS — M4316 Spondylolisthesis, lumbar region: Secondary | ICD-10-CM | POA: Diagnosis not present

## 2015-08-29 DIAGNOSIS — Z7984 Long term (current) use of oral hypoglycemic drugs: Secondary | ICD-10-CM | POA: Diagnosis not present

## 2015-09-02 DIAGNOSIS — G709 Myoneural disorder, unspecified: Secondary | ICD-10-CM | POA: Diagnosis not present

## 2015-09-02 DIAGNOSIS — Z4789 Encounter for other orthopedic aftercare: Secondary | ICD-10-CM | POA: Diagnosis not present

## 2015-09-02 DIAGNOSIS — M5416 Radiculopathy, lumbar region: Secondary | ICD-10-CM | POA: Diagnosis not present

## 2015-09-02 DIAGNOSIS — F419 Anxiety disorder, unspecified: Secondary | ICD-10-CM | POA: Diagnosis not present

## 2015-09-02 DIAGNOSIS — M4806 Spinal stenosis, lumbar region: Secondary | ICD-10-CM | POA: Diagnosis not present

## 2015-09-02 DIAGNOSIS — G40909 Epilepsy, unspecified, not intractable, without status epilepticus: Secondary | ICD-10-CM | POA: Diagnosis not present

## 2015-09-02 DIAGNOSIS — Z7984 Long term (current) use of oral hypoglycemic drugs: Secondary | ICD-10-CM | POA: Diagnosis not present

## 2015-09-02 DIAGNOSIS — I10 Essential (primary) hypertension: Secondary | ICD-10-CM | POA: Diagnosis not present

## 2015-09-02 DIAGNOSIS — E119 Type 2 diabetes mellitus without complications: Secondary | ICD-10-CM | POA: Diagnosis not present

## 2015-09-02 DIAGNOSIS — F329 Major depressive disorder, single episode, unspecified: Secondary | ICD-10-CM | POA: Diagnosis not present

## 2015-09-02 DIAGNOSIS — M4316 Spondylolisthesis, lumbar region: Secondary | ICD-10-CM | POA: Diagnosis not present

## 2015-09-03 ENCOUNTER — Other Ambulatory Visit (HOSPITAL_COMMUNITY): Payer: Medicare Other

## 2015-09-04 DIAGNOSIS — G40909 Epilepsy, unspecified, not intractable, without status epilepticus: Secondary | ICD-10-CM | POA: Diagnosis not present

## 2015-09-04 DIAGNOSIS — Z7984 Long term (current) use of oral hypoglycemic drugs: Secondary | ICD-10-CM | POA: Diagnosis not present

## 2015-09-04 DIAGNOSIS — I10 Essential (primary) hypertension: Secondary | ICD-10-CM | POA: Diagnosis not present

## 2015-09-04 DIAGNOSIS — E119 Type 2 diabetes mellitus without complications: Secondary | ICD-10-CM | POA: Diagnosis not present

## 2015-09-04 DIAGNOSIS — M4806 Spinal stenosis, lumbar region: Secondary | ICD-10-CM | POA: Diagnosis not present

## 2015-09-04 DIAGNOSIS — M5416 Radiculopathy, lumbar region: Secondary | ICD-10-CM | POA: Diagnosis not present

## 2015-09-04 DIAGNOSIS — F419 Anxiety disorder, unspecified: Secondary | ICD-10-CM | POA: Diagnosis not present

## 2015-09-04 DIAGNOSIS — M4316 Spondylolisthesis, lumbar region: Secondary | ICD-10-CM | POA: Diagnosis not present

## 2015-09-04 DIAGNOSIS — Z4789 Encounter for other orthopedic aftercare: Secondary | ICD-10-CM | POA: Diagnosis not present

## 2015-09-04 DIAGNOSIS — F329 Major depressive disorder, single episode, unspecified: Secondary | ICD-10-CM | POA: Diagnosis not present

## 2015-09-04 DIAGNOSIS — G709 Myoneural disorder, unspecified: Secondary | ICD-10-CM | POA: Diagnosis not present

## 2015-09-08 DIAGNOSIS — G709 Myoneural disorder, unspecified: Secondary | ICD-10-CM | POA: Diagnosis not present

## 2015-09-08 DIAGNOSIS — G40909 Epilepsy, unspecified, not intractable, without status epilepticus: Secondary | ICD-10-CM | POA: Diagnosis not present

## 2015-09-08 DIAGNOSIS — I10 Essential (primary) hypertension: Secondary | ICD-10-CM | POA: Diagnosis not present

## 2015-09-08 DIAGNOSIS — E119 Type 2 diabetes mellitus without complications: Secondary | ICD-10-CM | POA: Diagnosis not present

## 2015-09-08 DIAGNOSIS — F419 Anxiety disorder, unspecified: Secondary | ICD-10-CM | POA: Diagnosis not present

## 2015-09-08 DIAGNOSIS — F329 Major depressive disorder, single episode, unspecified: Secondary | ICD-10-CM | POA: Diagnosis not present

## 2015-09-08 DIAGNOSIS — M4806 Spinal stenosis, lumbar region: Secondary | ICD-10-CM | POA: Diagnosis not present

## 2015-09-08 DIAGNOSIS — Z7984 Long term (current) use of oral hypoglycemic drugs: Secondary | ICD-10-CM | POA: Diagnosis not present

## 2015-09-08 DIAGNOSIS — M5416 Radiculopathy, lumbar region: Secondary | ICD-10-CM | POA: Diagnosis not present

## 2015-09-08 DIAGNOSIS — M4316 Spondylolisthesis, lumbar region: Secondary | ICD-10-CM | POA: Diagnosis not present

## 2015-09-08 DIAGNOSIS — Z4789 Encounter for other orthopedic aftercare: Secondary | ICD-10-CM | POA: Diagnosis not present

## 2015-09-09 DIAGNOSIS — Z4789 Encounter for other orthopedic aftercare: Secondary | ICD-10-CM | POA: Diagnosis not present

## 2015-09-09 DIAGNOSIS — Z7984 Long term (current) use of oral hypoglycemic drugs: Secondary | ICD-10-CM | POA: Diagnosis not present

## 2015-09-09 DIAGNOSIS — I10 Essential (primary) hypertension: Secondary | ICD-10-CM | POA: Diagnosis not present

## 2015-09-09 DIAGNOSIS — G709 Myoneural disorder, unspecified: Secondary | ICD-10-CM | POA: Diagnosis not present

## 2015-09-09 DIAGNOSIS — M4316 Spondylolisthesis, lumbar region: Secondary | ICD-10-CM | POA: Diagnosis not present

## 2015-09-09 DIAGNOSIS — M5416 Radiculopathy, lumbar region: Secondary | ICD-10-CM | POA: Diagnosis not present

## 2015-09-09 DIAGNOSIS — F419 Anxiety disorder, unspecified: Secondary | ICD-10-CM | POA: Diagnosis not present

## 2015-09-09 DIAGNOSIS — G40909 Epilepsy, unspecified, not intractable, without status epilepticus: Secondary | ICD-10-CM | POA: Diagnosis not present

## 2015-09-09 DIAGNOSIS — F329 Major depressive disorder, single episode, unspecified: Secondary | ICD-10-CM | POA: Diagnosis not present

## 2015-09-09 DIAGNOSIS — M4806 Spinal stenosis, lumbar region: Secondary | ICD-10-CM | POA: Diagnosis not present

## 2015-09-09 DIAGNOSIS — E119 Type 2 diabetes mellitus without complications: Secondary | ICD-10-CM | POA: Diagnosis not present

## 2015-09-10 DIAGNOSIS — I10 Essential (primary) hypertension: Secondary | ICD-10-CM | POA: Diagnosis not present

## 2015-09-10 DIAGNOSIS — G40909 Epilepsy, unspecified, not intractable, without status epilepticus: Secondary | ICD-10-CM | POA: Diagnosis not present

## 2015-09-10 DIAGNOSIS — E119 Type 2 diabetes mellitus without complications: Secondary | ICD-10-CM | POA: Diagnosis not present

## 2015-09-10 DIAGNOSIS — M4316 Spondylolisthesis, lumbar region: Secondary | ICD-10-CM | POA: Diagnosis not present

## 2015-09-10 DIAGNOSIS — M5416 Radiculopathy, lumbar region: Secondary | ICD-10-CM | POA: Diagnosis not present

## 2015-09-10 DIAGNOSIS — G709 Myoneural disorder, unspecified: Secondary | ICD-10-CM | POA: Diagnosis not present

## 2015-09-10 DIAGNOSIS — Z4789 Encounter for other orthopedic aftercare: Secondary | ICD-10-CM | POA: Diagnosis not present

## 2015-09-10 DIAGNOSIS — Z7984 Long term (current) use of oral hypoglycemic drugs: Secondary | ICD-10-CM | POA: Diagnosis not present

## 2015-09-10 DIAGNOSIS — F329 Major depressive disorder, single episode, unspecified: Secondary | ICD-10-CM | POA: Diagnosis not present

## 2015-09-10 DIAGNOSIS — M4806 Spinal stenosis, lumbar region: Secondary | ICD-10-CM | POA: Diagnosis not present

## 2015-09-10 DIAGNOSIS — F419 Anxiety disorder, unspecified: Secondary | ICD-10-CM | POA: Diagnosis not present

## 2015-09-25 ENCOUNTER — Other Ambulatory Visit: Payer: Self-pay

## 2015-09-25 ENCOUNTER — Telehealth: Payer: Self-pay

## 2015-09-25 NOTE — Telephone Encounter (Signed)
Called patient today to get her scheduled for hospital follow up but patient has back surgery and states she is not allowed to ride in cars right now. Patient was advised to call back when she is release to do this to come in for hospital follow up. Reviewed patient's medications in her chart and updated this today. 09/25/15-Tanaia Hawkey a, RMA

## 2015-10-06 ENCOUNTER — Telehealth: Payer: Self-pay | Admitting: Psychiatry

## 2015-10-06 ENCOUNTER — Telehealth: Payer: Self-pay

## 2015-10-06 NOTE — Telephone Encounter (Signed)
pt called left a message that she is having problems with her lithium.  pt states that she is shaking and off balance.  pt states she had back surgery and can not afford to fall.  Pt was last seen on  08-11-15 next appt  11-10-15.

## 2015-10-06 NOTE — Telephone Encounter (Signed)
Patient was called and told to decrease lithium to 300 mg. She was told to make an appointment and patient stated that she has to first follow up with postop and then would make an appointment here.

## 2015-10-06 NOTE — Telephone Encounter (Signed)
left message that patient needs to call us back to make an appt to be seen.

## 2015-10-06 NOTE — Telephone Encounter (Signed)
Patient should probably reduce her dosage of lithium. I will give her a call and find out her symptoms and probably decrease her lithium.

## 2015-10-10 ENCOUNTER — Encounter: Payer: Self-pay | Admitting: Family Medicine

## 2015-10-10 DIAGNOSIS — M4316 Spondylolisthesis, lumbar region: Secondary | ICD-10-CM | POA: Diagnosis not present

## 2015-11-10 ENCOUNTER — Ambulatory Visit: Payer: 59 | Admitting: Psychiatry

## 2015-11-25 ENCOUNTER — Ambulatory Visit: Payer: Self-pay | Admitting: Family Medicine

## 2015-11-30 ENCOUNTER — Other Ambulatory Visit: Payer: Self-pay | Admitting: Cardiovascular Disease

## 2015-12-03 ENCOUNTER — Other Ambulatory Visit: Payer: Self-pay | Admitting: Family Medicine

## 2015-12-03 DIAGNOSIS — Z1231 Encounter for screening mammogram for malignant neoplasm of breast: Secondary | ICD-10-CM

## 2015-12-03 DIAGNOSIS — L84 Corns and callosities: Secondary | ICD-10-CM | POA: Diagnosis not present

## 2015-12-03 DIAGNOSIS — L218 Other seborrheic dermatitis: Secondary | ICD-10-CM | POA: Diagnosis not present

## 2015-12-03 DIAGNOSIS — L301 Dyshidrosis [pompholyx]: Secondary | ICD-10-CM | POA: Diagnosis not present

## 2015-12-05 ENCOUNTER — Encounter: Payer: Self-pay | Admitting: Family Medicine

## 2015-12-05 ENCOUNTER — Ambulatory Visit (INDEPENDENT_AMBULATORY_CARE_PROVIDER_SITE_OTHER): Payer: Medicare Other | Admitting: Family Medicine

## 2015-12-05 VITALS — BP 128/74 | HR 68 | Temp 99.1°F | Resp 16 | Wt 214.0 lb

## 2015-12-05 DIAGNOSIS — I10 Essential (primary) hypertension: Secondary | ICD-10-CM

## 2015-12-05 DIAGNOSIS — E785 Hyperlipidemia, unspecified: Secondary | ICD-10-CM | POA: Diagnosis not present

## 2015-12-05 DIAGNOSIS — E1100 Type 2 diabetes mellitus with hyperosmolarity without nonketotic hyperglycemic-hyperosmolar coma (NKHHC): Secondary | ICD-10-CM | POA: Diagnosis not present

## 2015-12-05 DIAGNOSIS — N3941 Urge incontinence: Secondary | ICD-10-CM | POA: Diagnosis not present

## 2015-12-05 NOTE — Progress Notes (Signed)
Patient: Abigail Figueroa Female    DOB: Nov 13, 1960   55 y.o.   MRN: [id number] Visit Date: 12/05/2015  Today's Provider: Vernie Murders, PA   Chief Complaint  Patient presents with  . Diabetes  . Hypertension   Subjective:    HPI  Diabetes Mellitus Type II, Follow-up:   Lab Results  Component Value Date   HGBA1C 9.2 (H) 07/21/2015   HGBA1C 9.3 (H) 01/10/2015   HGBA1C 9.4 (H) 08/16/2014    Last seen for diabetes 5 months ago.  Management since then includes no changes. She reports good compliance with treatment. She is not having side effects.  Home blood sugar records: fasting range: 140s  Episodes of hypoglycemia? no   Most Recent Eye Exam: 1year. Patient goes every September.  Weight trend: stable Prior visit with dietician: no Current diet: well balanced Current exercise: none due to back pain. Patient just had lumbar surgery in 08/2015.   Pertinent Labs:    Component Value Date/Time   CHOL 289 (H) 01/10/2015 1009   CHOL 277 (H) 08/16/2014 0843   TRIG 130 01/10/2015 1009   TRIG 167 (H) 08/16/2014 0843   HDL 91 01/10/2015 1009   HDL 77 08/16/2014 0843   LDLCALC 172 (H) 01/10/2015 1009   LDLCALC 167 (H) 08/16/2014 0843   CREATININE 1.24 (H) 08/22/2015 1116   CREATININE 1.01 07/30/2015 1012    Wt Readings from Last 3 Encounters:  12/05/15 214 lb (97.1 kg)  08/25/15 221 lb (100.2 kg)  08/22/15 221 lb (100.2 kg)    ------------------------------------------------------------------------  Hypertension, follow-up:  BP Readings from Last 3 Encounters:  12/05/15 128/74  08/28/15 (!) 149/67  08/22/15 (!) 145/74    She was last seen for hypertension 5 months ago.  BP at that visit was 138/70. Management since that visit includes no changes. She reports good compliance with treatment. She is not having side effects.  She is not exercising. She is adherent to low salt diet.   Outside blood pressures are not being checked. Patient denies  chest pain, dyspnea, irregular heart beat, palpitations and tachypnea.   Cardiovascular risk factors include diabetes mellitus.     Weight trend: stable Wt Readings from Last 3 Encounters:  12/05/15 214 lb (97.1 kg)  08/25/15 221 lb (100.2 kg)  08/22/15 221 lb (100.2 kg)    Current diet: well balanced, on average, 3 meals per day  Past Medical History:  Diagnosis Date  . Abnormal stress test    a. 10/2009: Normal myocardial perfusion imaging; b. 08/2015 MV: medium defect of mod severity in mid ant apical region w/ mild HK of the distal inf wall;  c. 08/2015 Cath: nl Cors, EF 60%.  . Anemia   . Anxiety   . Arthritis   . Depression   . Edema   . Essential hypertension   . Frequent urination   . Frequent urination at night   . Headache    migraines, gets botox injections  . Heart palpitations June 2013   Event monitor showing sinus tachycardia, PACs with couplets and triplets.  Marland Kitchen Hx of echocardiogram    a. 10/2009 Echo: showed normal left ventricular function, mild left ventricular hypertrophy, and no significant valve abnormalities.  . Hypercholesterolemia   . Migraine   . Morbid obesity ([place])   . Neuromuscular disorder ([place])   . OSA (obstructive sleep apnea)    has been on continuous positive airway pressure  . Seizure disorder ([place])   .  Seizures ([place])   . Type II diabetes mellitus ([place])    Past Surgical History:  Procedure Laterality Date  . ABDOMINAL HYSTERECTOMY  1990   without BSO  . ABDOMINAL HYSTERECTOMY  1990  . BACK SURGERY  1900s 2001   x2 with "cage put in"  . BACK SURGERY  2001  . BILATERAL SALPINGOOPHORECTOMY  1990  . CARDIAC CATHETERIZATION N/A 08/06/2015   Procedure: Left Heart Cath and Coronary Angiography;  Surgeon: Troy Sine, MD;  Location: [facility];  Service: Cardiovascular;  Laterality: N/A;  . CARPAL TUNNEL RELEASE Bilateral   . CHOLECYSTECTOMY  1980s  . CHOLECYSTECTOMY  1980  . DORSAL COMPARTMENT RELEASE Left 10/25/2014   Procedure:  LEFT FIRST  DORSAL COMPARTMENT RELEASE AND RADIAL TENOSYNOVECTOMY ;  Surgeon: Roseanne Kaufman, MD;  Location: [facility];  Service: Orthopedics;  Laterality: Left;  . HAND SURGERY    . KNEE SURGERY Right 2012   meniscus tear  . KNEE SURGERY  2012  . LAMINECTOMY  1995  . TUBAL LIGATION  1982  . WRIST SURGERY Right    Family History  Problem Relation Age of Onset  . Sarcoidosis Mother   . Seizures Mother   . Heart attack Mother   . Alzheimer's disease Maternal Grandmother   . Dementia Maternal Grandmother   . Hypertension Maternal Grandfather   . Diabetes Maternal Grandfather    Allergies  Allergen Reactions  . Morphine And Related Hives    Nausea and vomiting   . Oxycontin [Oxycodone Hcl] Swelling, Hives and Itching   Current Meds  Medication Sig  . BOTOX 100 UNITS SOLR injection Inject into the muscle every 3 (three) months.   . chlorproMAZINE (THORAZINE) 25 MG tablet Take 25 mg by mouth 3 (three) times daily as needed (migraines).   . fluocinonide ointment (LIDEX) [id number] % Apply 1 application topically 2 (two) times daily. (Patient taking differently: Apply 1 application topically 2 (two) times daily as needed. )  . furosemide (LASIX) 20 MG tablet Take 1 tablet (20 mg total) by mouth daily as needed for fluid or edema. (Patient taking differently: Take 20 mg by mouth daily. )  . HYDROmorphone (DILAUDID) 2 MG tablet Take 1 tablet (2 mg total) by mouth every 4 (four) hours as needed for moderate pain or severe pain.  Marland Kitchen ketoconazole (NIZORAL) 2 % shampoo Apply 1 application topically 2 (two) times a week.  . lamoTRIgine (LAMICTAL) 100 MG tablet Take 100 mg by mouth 2 (two) times daily.  Marland Kitchen lithium 300 MG tablet Take 2 tablets (600 mg total) by mouth at bedtime.  . metFORMIN (GLUCOPHAGE) 500 MG tablet TAKE 2 TABLETS IN THE MORNING, 1 TABLET AT LUNCH TIME AND 2 TABLETS IN THE EVENING  . metoprolol (LOPRESSOR) 50 MG tablet TAKE 1 TABLET (50 MG TOTAL) BY MOUTH 2 (TWO) TIMES  DAILY.  Marland Kitchen potassium chloride (MICRO-K) 10 MEQ CR capsule Take 1 capsule (10 mEq total) by mouth daily as needed. (Patient taking differently: Take 10 mEq by mouth daily. )  . TEGRETOL-XR 400 MG 12 hr tablet TAKE 1 TABLET BY MOUTH TWICE DAILY  . triamcinolone cream (KENALOG) 0.1 % Apply 1 application topically 2 (two) times daily.  . Vilazodone HCl (VIIBRYD) 40 MG TABS Take 1 tablet (40 mg total) by mouth daily.  Marland Kitchen VYTORIN 10-40 MG tablet TAKE 1 TABLET BY MOUTH AT BEDTIME    Review of Systems  Constitutional: Negative.   Respiratory: Negative.   Cardiovascular: Negative.  Endocrine: Negative.   Musculoskeletal: Positive for arthralgias and back pain.    Social History  Substance Use Topics  . Smoking status: Former Smoker    Types: Cigarettes    Quit date: 01/03/1979  . Smokeless tobacco: Never Used     Comment: quit in 1984  . Alcohol use No   Objective:   BP 128/74 (BP Location: Right Arm, Patient Position: Sitting, Cuff Size: Normal)   Pulse 68   Temp 99.1 F (37.3 C)   Resp 16   Wt 214 lb (97.1 kg)   LMP  (LMP Unknown)   BMI 37.91 kg/m   Physical Exam  Constitutional: She is oriented to person, place, and time. She appears well-developed and well-nourished.  HENT:  Head: Normocephalic.  Right Ear: External ear normal.  Left Ear: External ear normal.  Nose: Nose normal.  Mouth/Throat: Oropharynx is clear and moist.  Eyes: Conjunctivae are normal.  Neck: Neck supple. No thyromegaly present.  Cardiovascular: Normal rate and regular rhythm.   Pulmonary/Chest: Breath sounds normal.  Abdominal: Soft. Bowel sounds are normal.  Musculoskeletal: She exhibits tenderness.  Tender lower lumbar scar from L4-5 decompression with fusion by Dr. Arnoldo Morale in April 2017. Limits flexion ability. Having less leg pains.  Lymphadenopathy:    She has no cervical adenopathy.  Neurological: She is alert and oriented to person, place, and time.      Assessment & Plan:     1. Type 2  diabetes mellitus with hyperosmolarity without coma, without long-term current use of insulin ([place]) Feels she is controlling blood sugar better since losing 7 lbs after spine surgery in April 2017. Continues to tolerate metformin 500 mg 2 at breakfast and supper with 1 at lunch. Last Hgb A1C was 9.2% in March 2017. Will recheck labs. Continue present diet and medication regimen. Scheduled for ophthalmology exam Sept. 2017. Recheck pending reports. - CBC with Differential/Platelet - Comprehensive metabolic panel - Hemoglobin A1c  2. Urge incontinence Continues to have urge incontinence and has to use adult diapers 3-4 times a day. Denies dysuria, hematuria or fever. No improvement since L4-5 decompression with fusion by neurosurgeon (Dr. Arnoldo Morale). May need recheck with urologist if this continues to progress.  3. Hyperlipemia Tolerating Vytorin 10-40 mg qd without side effects. Recheck labs and follow up pending reports. - Comprehensive metabolic panel - Lipid panel - TSH  4. Essential hypertension Very good BP control. Still taking Metoprolol daily and occasional diuretic with KCL for CHF. Followed by Dr. Claiborne Billings (cardiologist) once or twice a year. - CBC with Differential/Platelet - Comprehensive metabolic panel - TSH       Vernie Murders, PA  [facility]

## 2015-12-19 ENCOUNTER — Telehealth: Payer: Self-pay | Admitting: Family Medicine

## 2015-12-19 NOTE — Telephone Encounter (Signed)
Remind patient to get lab work done if she hasn't already done it.

## 2015-12-19 NOTE — Telephone Encounter (Signed)
Pt reminded. States she will get this done on Monday. Renaldo Fiddler, CMA

## 2015-12-24 ENCOUNTER — Ambulatory Visit: Payer: Medicare Other | Attending: Family Medicine

## 2016-01-22 DIAGNOSIS — G43719 Chronic migraine without aura, intractable, without status migrainosus: Secondary | ICD-10-CM | POA: Diagnosis not present

## 2016-01-22 DIAGNOSIS — R51 Headache: Secondary | ICD-10-CM | POA: Diagnosis not present

## 2016-01-22 DIAGNOSIS — M542 Cervicalgia: Secondary | ICD-10-CM | POA: Diagnosis not present

## 2016-01-22 DIAGNOSIS — M791 Myalgia: Secondary | ICD-10-CM | POA: Diagnosis not present

## 2016-01-23 ENCOUNTER — Emergency Department: Payer: Medicare Other

## 2016-01-23 ENCOUNTER — Emergency Department
Admission: EM | Admit: 2016-01-23 | Discharge: 2016-01-23 | Disposition: A | Discharge status: Home / Self Care | Payer: Medicare Other | Attending: Emergency Medicine | Admitting: Emergency Medicine

## 2016-01-23 ENCOUNTER — Encounter: Payer: Self-pay | Admitting: Emergency Medicine

## 2016-01-23 DIAGNOSIS — H6121 Impacted cerumen, right ear: Secondary | ICD-10-CM | POA: Diagnosis not present

## 2016-01-23 DIAGNOSIS — R29810 Facial weakness: Secondary | ICD-10-CM | POA: Diagnosis present

## 2016-01-23 DIAGNOSIS — I509 Heart failure, unspecified: Secondary | ICD-10-CM | POA: Insufficient documentation

## 2016-01-23 DIAGNOSIS — E119 Type 2 diabetes mellitus without complications: Secondary | ICD-10-CM | POA: Diagnosis not present

## 2016-01-23 DIAGNOSIS — Z7984 Long term (current) use of oral hypoglycemic drugs: Secondary | ICD-10-CM | POA: Diagnosis not present

## 2016-01-23 DIAGNOSIS — Z87891 Personal history of nicotine dependence: Secondary | ICD-10-CM | POA: Diagnosis not present

## 2016-01-23 DIAGNOSIS — I110 Hypertensive heart disease with heart failure: Secondary | ICD-10-CM | POA: Diagnosis not present

## 2016-01-23 DIAGNOSIS — Z79899 Other long term (current) drug therapy: Secondary | ICD-10-CM | POA: Insufficient documentation

## 2016-01-23 DIAGNOSIS — R51 Headache: Secondary | ICD-10-CM | POA: Diagnosis not present

## 2016-01-23 DIAGNOSIS — R4781 Slurred speech: Secondary | ICD-10-CM | POA: Diagnosis not present

## 2016-01-23 DIAGNOSIS — G510 Bell's palsy: Secondary | ICD-10-CM | POA: Diagnosis not present

## 2016-01-23 LAB — COMPREHENSIVE METABOLIC PANEL
ALBUMIN: 4 g/dL (ref 3.5–5.0)
ALK PHOS: 104 U/L (ref 38–126)
ALT: 28 U/L (ref 14–54)
AST: 42 U/L — AB (ref 15–41)
Anion gap: 5 (ref 5–15)
BILIRUBIN TOTAL: 0.2 mg/dL — AB (ref 0.3–1.2)
BUN: 23 mg/dL — AB (ref 6–20)
CALCIUM: 10.1 mg/dL (ref 8.9–10.3)
CO2: 28 mmol/L (ref 22–32)
Chloride: 106 mmol/L (ref 101–111)
Creatinine, Ser: 1.13 mg/dL — ABNORMAL HIGH (ref 0.44–1.00)
GFR calc Af Amer: >60 mL/min (ref >60)
GFR calc non Af Amer: 54 mL/min — ABNORMAL LOW (ref >60)
GLUCOSE: 275 mg/dL — AB (ref 65–99)
Potassium: 4.5 mmol/L (ref 3.5–5.1)
Sodium: 139 mmol/L (ref 135–145)
TOTAL PROTEIN: 7.8 g/dL (ref 6.5–8.1)

## 2016-01-23 LAB — CBC WITH DIFFERENTIAL/PLATELET
BASOS ABS: 0.1 10*3/uL (ref 0–0.1)
BASOS PCT: 1 %
EOS PCT: 3 %
Eosinophils Absolute: 0.3 10*3/uL (ref 0–0.7)
HCT: 36.1 % (ref 35.0–47.0)
Hemoglobin: 12 g/dL (ref 12.0–16.0)
LYMPHS PCT: 37 %
Lymphs Abs: 2.9 10*3/uL (ref 1.0–3.6)
MCH: 27.8 pg (ref 26.0–34.0)
MCHC: 33.1 g/dL (ref 32.0–36.0)
MCV: 83.9 fL (ref 80.0–100.0)
MONO ABS: 0.5 10*3/uL (ref 0.2–0.9)
Monocytes Relative: 6 %
Neutro Abs: 4.2 10*3/uL (ref 1.4–6.5)
Neutrophils Relative %: 53 %
PLATELETS: 229 10*3/uL (ref 150–440)
RBC: 4.3 MIL/uL (ref 3.80–5.20)
RDW: 15.3 % — AB (ref 11.5–14.5)
WBC: 7.8 10*3/uL (ref 3.6–11.0)

## 2016-01-23 LAB — CARBAMAZEPINE LEVEL, TOTAL: Carbamazepine Lvl: 4.4 ug/mL (ref 4.0–12.0)

## 2016-01-23 MED ORDER — PROCHLORPERAZINE EDISYLATE 5 MG/ML IJ SOLN
10.0000 mg | Freq: Once | INTRAMUSCULAR | Status: AC
  Administered 2016-01-23: 10 mg via INTRAVENOUS
  Filled 2016-01-23: qty 2

## 2016-01-23 MED ORDER — PREDNISONE 20 MG PO TABS: 60.0000 mg | ORAL_TABLET | Freq: Every day | ORAL | 0 refills | Status: DC

## 2016-01-23 MED ORDER — LORAZEPAM 2 MG/ML IJ SOLN
INTRAMUSCULAR | Status: AC
  Administered 2016-01-23: 1 mg via INTRAVENOUS
  Filled 2016-01-23: qty 1

## 2016-01-23 MED ORDER — PREDNISONE 20 MG PO TABS
60.0000 mg | ORAL_TABLET | Freq: Once | ORAL | Status: AC
  Administered 2016-01-23: 60 mg via ORAL
  Filled 2016-01-23: qty 3

## 2016-01-23 MED ORDER — SODIUM CHLORIDE 0.9 % IV BOLUS (SEPSIS)
1000.0000 mL | Freq: Once | INTRAVENOUS | Status: AC
  Administered 2016-01-23: 1000 mL via INTRAVENOUS

## 2016-01-23 MED ORDER — LORAZEPAM 2 MG/ML IJ SOLN
1.0000 mg | Freq: Once | INTRAMUSCULAR | Status: AC
  Administered 2016-01-23: 1 mg via INTRAVENOUS

## 2016-01-23 NOTE — ED Notes (Signed)
Dr. Clearnce Hasten at bedside

## 2016-01-23 NOTE — ED Notes (Signed)
MRI at bedside

## 2016-01-23 NOTE — ED Triage Notes (Signed)
Reports going to the headache clinic yesterday to get trigger shots.  States this am she woke up with slurred speech and left side facial droop.

## 2016-01-23 NOTE — ED Notes (Signed)
Pt taken to MRI  

## 2016-01-23 NOTE — ED Provider Notes (Signed)
[facility] Emergency Department Provider Note   ____________________________________________   First MD Initiated Contact with Patient 01/23/16 1531     (approximate)  I have reviewed the triage vital signs and the nursing notes.   HISTORY  Chief Complaint Aphasia   HPI Abigail Figueroa is a 55 y.o. female with a history of hypertension, diabetes, migraine and hypercholesterolemia who is presenting to the emergency department today with a right-sided headache and facial droop. She says that she had trigger point injections yesterday morning at her [facility] in [place] by Dr. Domingo Cocking. She says that about 6 PM last night she started having right-sided numbness to her tongue and then this morning noticed a right-sided facial droop. She says that she also has neither 10 throbbing pain to the right side of her head that is associated with nausea and photophobia. She says that this has been ongoing for 1 week and started mildly with gradual onset. She says it is consistent with her chronic migraine. She is unsure of what exactly was injected into her yesterday during her trigger point injections but said that they were down both sides of her face and and into the right mandible. She says that she is sure that she did not have Botox yesterday.   Past Medical History:  Diagnosis Date  . Abnormal stress test    a. 10/2009: Normal myocardial perfusion imaging; b. 08/2015 MV: medium defect of mod severity in mid ant apical region w/ mild HK of the distal inf wall;  c. 08/2015 Cath: nl Cors, EF 60%.  . Anemia   . Anxiety   . Arthritis   . Depression   . Edema   . Essential hypertension   . Frequent urination   . Frequent urination at night   . Headache    migraines, gets botox injections  . Heart palpitations June 2013   Event monitor showing sinus tachycardia, PACs with couplets and triplets.  Marland Kitchen Hx of echocardiogram    a. 10/2009 Echo: showed  normal left ventricular function, mild left ventricular hypertrophy, and no significant valve abnormalities.  . Hypercholesterolemia   . Migraine   . Morbid obesity ([place])   . Neuromuscular disorder ([place])   . OSA (obstructive sleep apnea)    has been on continuous positive airway pressure  . Seizure disorder ([place])   . Seizures ([place])   . Type II diabetes mellitus [facility])     Patient Active Problem List   Diagnosis Date Noted  . Spondylolisthesis of lumbar region 08/25/2015  . Type II diabetes mellitus ([place])   . Abnormal stress test   . Hypercholesterolemia   . Abnormal nuclear stress test 07/30/2015  . Preoperative clearance 07/22/2015  . Lower extremity edema 07/22/2015  . History of brain disorder 11/05/2014  . H/O: obesity 11/05/2014  . Depression, major, recurrent, moderate ([place]) 11/05/2014  . Difficulty with family 11/05/2014  . Feeling stressed out 11/05/2014  . H/O: HTN (hypertension) 11/05/2014  . H/O elevated lipids 11/05/2014  . Anxiety, generalized 11/05/2014  . Migraine headache 11/05/2014  . Moderate recurrent major depression ([place]) 10/16/2014  . Generalized anxiety disorder 10/16/2014  . CHF (congestive heart failure) ([place]) 10/14/2014  . Edema 10/14/2014  . Depressive disorder 10/14/2014  . Obstructive sleep apnea 10/14/2014  . Diabetes ([place]) 10/14/2014  . Essential hypertension 10/14/2014  . Epilepsy ([place]) 10/14/2014  . Migraine 10/14/2014  . Hyperlipemia 10/14/2014  . Dysthymic disorder 10/14/2014  . Seizure ([place]) 10/14/2014  .  Hypercholesteremia 10/14/2014  . Palpitation 10/14/2014  . Depression 10/14/2014  . Dyspepsia 10/14/2014  . Hematochezia 10/14/2014  . Urge incontinence 10/14/2014  . Hair thinning 10/14/2014  . Dermatitis, eczematoid 09/10/2014  . Acid indigestion 09/10/2014  . Alopecia 09/10/2014  . Blood in feces 09/10/2014  . H/O hypercholesterolemia 09/10/2014  . Arthralgia of hip 09/10/2014  . Feeling bilious 09/10/2014  . Awareness of  heartbeats 09/10/2014  . Pain in the wrist 09/10/2014  . Convulsions ([place]) 09/10/2014  . Urge incontinence 09/10/2014  . Snapping thumb syndrome 04/19/2014  . Diabetes mellitus ([place]) 03/14/2014  . De Quervain's disease (radial styloid tenosynovitis) 03/08/2014  . LBP (low back pain) 03/07/2013  . Mixed hyperlipidemia 01/15/2013  . Encounter for long-term (current) use of other medications - statin 01/15/2013  . Fatigue 01/15/2013  . Heart palpation 01/15/2013  . Obesity (BMI 30-39.9) 01/02/2013  . CCF (congestive cardiac failure) ([place]) 11/13/2009  . Malaise and fatigue 02/17/2009  . Sprain and strain of interphalangeal (joint) of hand 10/23/2007  . HEADACHE 06/23/2007  . Diabetes mellitus type 2 in obese ([place]) 05/08/2007  . HYPERCALCEMIA 05/08/2007  . Sleep apnea 05/08/2007  . Edema 05/08/2007  . DIARRHEA, CHRONIC 05/08/2007  . Clinical depression 10/20/2006  . Obstructive apnea 03/22/2006  . Diabetes mellitus, type 2 ([place]) 11/12/2005  . Depression, neurotic 11/12/2005  . Epilepsy ([place]) 11/12/2005  . Combined fat and carbohydrate induced hyperlipemia 11/12/2005  . Acute onset aura migraine 11/12/2005    Past Surgical History:  Procedure Laterality Date  . ABDOMINAL HYSTERECTOMY  1990   without BSO  . ABDOMINAL HYSTERECTOMY  1990  . BACK SURGERY  1900s 2001   x2 with "cage put in"  . BACK SURGERY  2001  . BILATERAL SALPINGOOPHORECTOMY  1990  . CARDIAC CATHETERIZATION N/A 08/06/2015   Procedure: Left Heart Cath and Coronary Angiography;  Surgeon: Troy Sine, MD;  Location: [facility];  Service: Cardiovascular;  Laterality: N/A;  . CARPAL TUNNEL RELEASE Bilateral   . CHOLECYSTECTOMY  1980s  . CHOLECYSTECTOMY  1980  . DORSAL COMPARTMENT RELEASE Left 10/25/2014   Procedure: LEFT FIRST  DORSAL COMPARTMENT RELEASE AND RADIAL TENOSYNOVECTOMY ;  Surgeon: Roseanne Kaufman, MD;  Location: [facility];  Service: Orthopedics;  Laterality: Left;  . HAND SURGERY     . KNEE SURGERY Right 2012   meniscus tear  . KNEE SURGERY  2012  . LAMINECTOMY  1995  . TUBAL LIGATION  1982  . WRIST SURGERY Right     Prior to Admission medications   Medication Sig Start Date End Date Taking? Authorizing Provider  baclofen (LIORESAL) 10 MG tablet Take 1 tablet (10 mg total) by mouth 3 (three) times daily. Patient taking differently: Take 10 mg by mouth 3 (three) times daily as needed (Headaches).  04/10/15   Vickki Muff Chrismon, PA  BOTOX 100 UNITS SOLR injection Inject into the muscle every 3 (three) months.  11/01/12   Historical Provider, MD  chlorproMAZINE (THORAZINE) 25 MG tablet Take 25 mg by mouth 3 (three) times daily as needed (migraines).     Historical Provider, MD  fluocinonide ointment (LIDEX) 7.56 % Apply 1 application topically 2 (two) times daily. Patient taking differently: Apply 1 application topically 2 (two) times daily as needed.  04/10/15   Vickki Muff Chrismon, PA  furosemide (LASIX) 20 MG tablet Take 1 tablet (20 mg total) by mouth daily as needed for fluid or edema. Patient taking differently: Take 20 mg by mouth daily.  07/22/15   Brett Canales, PA-C  HYDROmorphone (DILAUDID) 2 MG tablet Take 1 tablet (2 mg total) by mouth every 4 (four) hours as needed for moderate pain or severe pain. 08/28/15   Newman Pies, MD  ketoconazole (NIZORAL) 2 % shampoo Apply 1 application topically 2 (two) times a week.    Historical Provider, MD  lamoTRIgine (LAMICTAL) 100 MG tablet Take 100 mg by mouth 2 (two) times daily.    Historical Provider, MD  lithium 300 MG tablet Take 2 tablets (600 mg total) by mouth at bedtime. 08/11/15   Himabindu Ravi, MD  metFORMIN (GLUCOPHAGE) 500 MG tablet TAKE 2 TABLETS IN THE MORNING, 1 TABLET AT LUNCH TIME AND 2 TABLETS IN THE EVENING 04/15/15   Vickki Muff Chrismon, PA  metoprolol (LOPRESSOR) 50 MG tablet TAKE 1 TABLET (50 MG TOTAL) BY MOUTH 2 (TWO) TIMES DAILY. 12/02/15   Troy Sine, MD  potassium chloride (MICRO-K) 10 MEQ CR capsule  Take 1 capsule (10 mEq total) by mouth daily as needed. Patient taking differently: Take 10 mEq by mouth daily.  07/22/15   Brett Canales, PA-C  TEGRETOL-XR 400 MG 12 hr tablet TAKE 1 TABLET BY MOUTH TWICE DAILY 05/08/15   Vickki Muff Chrismon, PA  triamcinolone cream (KENALOG) 0.1 % Apply 1 application topically 2 (two) times daily.    Historical Provider, MD  Vilazodone HCl (VIIBRYD) 40 MG TABS Take 1 tablet (40 mg total) by mouth daily. 08/11/15   Himabindu Ravi, MD  VYTORIN 10-40 MG tablet TAKE 1 TABLET BY MOUTH AT BEDTIME 03/04/15   Troy Sine, MD    Allergies Morphine and related and Oxycontin [oxycodone hcl]  Family History  Problem Relation Age of Onset  . Sarcoidosis Mother   . Seizures Mother   . Heart attack Mother   . Alzheimer's disease Maternal Grandmother   . Dementia Maternal Grandmother   . Hypertension Maternal Grandfather   . Diabetes Maternal Grandfather     Social History Social History  Substance Use Topics  . Smoking status: Former Smoker    Types: Cigarettes    Quit date: 01/03/1979  . Smokeless tobacco: Never Used     Comment: quit in 1984  . Alcohol use No    Review of Systems Constitutional: No fever/chills Eyes: No visual changes. ENT: No sore throat. Cardiovascular: Denies chest pain. Respiratory: Denies shortness of breath. Gastrointestinal: No abdominal pain.  No nausea, no vomiting.  No diarrhea.  No constipation. Genitourinary: Negative for dysuria. Musculoskeletal: Negative for back pain. Skin: Negative for rash. Neurological: Negative for   10-point ROS otherwise negative.  ____________________________________________   PHYSICAL EXAM:  VITAL SIGNS: ED Triage Vitals [01/23/16 1506]  Enc Vitals Group     BP (!) 164/70     Pulse Rate 72     Resp 18     Temp 98.3 F (36.8 C)     Temp Source Oral     SpO2 98 %     Weight 211 lb (95.7 kg)     Height 5\' 3"  (1.6 m)     Head Circumference      Peak Flow      Pain Score 7     Pain  Loc      Pain Edu?      Excl. in [place]?     Constitutional: Alert and oriented. Well appearing and in no acute distress. Eyes: Conjunctivae are normal. PERRL. EOMI. Head: Atraumatic. Nose: No congestion/rhinnorhea. Mouth/Throat: Mucous membranes are moist.  Neck: No stridor.   Cardiovascular: Normal rate, regular rhythm. Grossly normal heart sounds.  Good peripheral circulation. Respiratory: Normal respiratory effort.  No retractions. Lungs CTAB. Gastrointestinal: Soft and nontender. No distention.  Musculoskeletal: No lower extremity tenderness nor edema.  No joint effusions. Neurologic:  Normal speech and language. Right-sided facial droop that appears peripheral. The right eyebrow is unable to be lowered as far as the left and there appears to be flattening of the right side of the forehead as well when she attempts to close her eyes. Skin:  Skin is warm, dry and intact. No rash noted. Psychiatric: Mood and affect are normal. Speech and behavior are normal.  ____________________________________________   LABS (all labs ordered are listed, but only abnormal results are displayed)  Labs Reviewed  COMPREHENSIVE METABOLIC PANEL - Abnormal; Notable for the following:       Result Value   Glucose, Bld 275 (*)    BUN 23 (*)    Creatinine, Ser 1.13 (*)    AST 42 (*)    Total Bilirubin 0.2 (*)    GFR calc non Af Amer 54 (*)    All other components within normal limits  CBC WITH DIFFERENTIAL/PLATELET - Abnormal; Notable for the following:    RDW 15.3 (*)    All other components within normal limits   ____________________________________________  EKG  ED ECG REPORT I, Kahlin Mark,  Youlanda Roys, the attending physician, personally viewed and interpreted this ECG.   Date: 01/23/2016  EKG Time: 1502  Rate: 75  Rhythm: normal sinus rhythm  Axis: Normal  Intervals:none  ST&T Change: No ST segment elevation or depression. No abnormal T-wave  inversion.  ____________________________________________  RADIOLOGY  CT Head Wo Contrast (Accession [id number]) (Order [id number])  Imaging  Date: 01/23/2016 Department: [facility] EMERGENCY DEPARTMENT Released By: Theodis Sato, RN (auto-released) Authorizing: Orbie Pyo, MD  PACS Images   Show images for CT Head Wo Contrast  Study Result   CLINICAL DATA:  Headache, left facial droop  EXAM: CT HEAD WITHOUT CONTRAST  TECHNIQUE: Contiguous axial images were obtained from the base of the skull through the vertex without intravenous contrast.  COMPARISON:  10/21/2005  FINDINGS: Brain: No evidence of acute infarction, hemorrhage, hydrocephalus, extra-axial collection or mass lesion/mass effect.  Vascular: No hyperdense vessel or unexpected calcification.  Skull: No osseous abnormality.  Sinuses/Orbits: Visualized paranasal sinuses are clear. Visualized mastoid sinuses are clear. Visualized orbits demonstrate no focal abnormality.  Other: None  IMPRESSION: No acute intracranial pathology.   Electronically Signed   By: Kathreen Devoid   On: 01/23/2016 15:35    MR Brain Wo Contrast (Accession [id number]) (Order [id number])  Imaging  Date: 01/23/2016 Department: [facility] EMERGENCY DEPARTMENT Released By/Authorizing: Orbie Pyo, MD (auto-released)  PACS Images   Show images for MR Brain Wo Contrast  Study Result   CLINICAL DATA:  55 y/o F; patient woke up with slurred speech and facial droop.  EXAM: MRI HEAD WITHOUT CONTRAST  TECHNIQUE: Multiplanar, multiecho pulse sequences of the brain and surrounding structures were obtained without intravenous contrast.  COMPARISON:  None.  FINDINGS: Brain: Moderate motion degradation. No diffusion restriction to suggest acute or early subacute infarct. No abnormal susceptibility hypointensity to indicate intracranial hemorrhage. No  significant T2 FLAIR signal abnormality. No focal mass effect.  Vascular: Normal flow voids.  Skull and upper cervical spine: Normal marrow signal.  Sinuses/Orbits: Left maxillary sinus mucous retention cyst. Orbits are unremarkable. No abnormal signal of mastoid  air cells.  Other: None.  IMPRESSION: No acute intracranial abnormality is identified. Moderate motion degradation. Unremarkable MRI of brain for age.   Electronically Signed   By: Kristine Garbe M.D.   On: 01/23/2016 18:07     ____________________________________________   PROCEDURES  Procedure(s) performed:  Ceruminosis is noted.  Wax is removed by syringing and manual debridement. Instructions for home care to prevent wax buildup are given.  Procedures  Critical Care performed:   ____________________________________________   INITIAL IMPRESSION / ASSESSMENT AND PLAN / ED COURSE  Pertinent labs & imaging results that were available during my care of the patient were reviewed by me and considered in my medical decision making (see chart for details).  ----------------------------------------- 5:11 PM on 01/23/2016 -----------------------------------------  I discussed the case Dr. Doy Mince of neurology here at [facility]. She says that it is possible that the patient could be caused by the trigger point injections but she is unclear of why it would be caused if the patient was not ejected with Botox. Unclear if there was possibly lidocaine and the injections. However, the time frame seems to be distant between the onset of the numbness and the timing of the trigger point injections. The droop appears peripheral, however because of the discrepancy in timeline and also not being able to contact the patient's headache Center we will obtain an MRI to make sure there is no ongoing central process. We attempted to contact the headache Center where the patient had the trigger  point injections, however they close to 11:45 AM and there is no answering service. I explained the plan to the patient as well as family and they're understanding willing to comply. The patient says that her headache is improving at this time with Compazine over the facial droop is to present on the right side.  Clinical Course   ----------------------------------------- 8:50 PM on 01/23/2016 -----------------------------------------  Patient is resting comfortably at this time. Reassuring MRI. Likely palsy versus paralysis from injections yesterday at her headache specialist. I will be prescribing her steroids for a possible Bell's palsy. She'll be following up with her headache specialist this Monday. I inspected her years and she had a cerumen impaction in the right ear which was able to clear with pressurized water from an 18-gauge catheter. A raisin sized impaction was cleared and there is a normal TM underlying and a normal canal. Left TM also is normal.  ____________________________________________   FINAL CLINICAL IMPRESSION(S) / ED DIAGNOSES  Peripheral nerve VII, right-sided palsy.    NEW MEDICATIONS STARTED DURING THIS VISIT:  New Prescriptions   No medications on file     Note:  This document was prepared using Dragon voice recognition software and may include unintentional dictation errors.    Orbie Pyo, MD 01/23/16 2051

## 2016-01-27 DIAGNOSIS — G43719 Chronic migraine without aura, intractable, without status migrainosus: Secondary | ICD-10-CM | POA: Diagnosis not present

## 2016-02-02 ENCOUNTER — Ambulatory Visit (INDEPENDENT_AMBULATORY_CARE_PROVIDER_SITE_OTHER): Payer: Medicare Other | Admitting: Family Medicine

## 2016-02-02 ENCOUNTER — Encounter: Payer: Self-pay | Admitting: Family Medicine

## 2016-02-02 VITALS — BP 158/92 | HR 62 | Temp 98.3°F | Resp 14 | Wt 219.8 lb

## 2016-02-02 DIAGNOSIS — E119 Type 2 diabetes mellitus without complications: Secondary | ICD-10-CM

## 2016-02-02 DIAGNOSIS — G510 Bell's palsy: Secondary | ICD-10-CM | POA: Insufficient documentation

## 2016-02-02 DIAGNOSIS — I509 Heart failure, unspecified: Secondary | ICD-10-CM | POA: Diagnosis not present

## 2016-02-02 LAB — POCT GLYCOSYLATED HEMOGLOBIN (HGB A1C): Hemoglobin A1C: 8.6

## 2016-02-02 MED ORDER — GLUCOSE BLOOD VI STRP: ORAL_STRIP | 12 refills | Status: DC

## 2016-02-02 MED ORDER — ONETOUCH ULTRASOFT LANCETS MISC
12 refills | Status: DC
Start: 2016-02-02 — End: 2016-06-25

## 2016-02-02 NOTE — Progress Notes (Signed)
Patient: Abigail Figueroa Female    DOB: 04-08-1961   55 y.o.   MRN: [id number] Visit Date: 02/02/2016  Today's Provider: Vernie Murders, PA   Chief Complaint  Patient presents with  . Hospitalization Follow-up   Subjective:    HPI  Follow up Hospitalization  Patient was admitted to [facility] on 01/23/2016 and discharged on 01/24/2016. She was treated for Bell's Palsy. Treatment for this included started prednisone 20 mg. Take 60 mg daily. Patient then was seen by Dr. Domingo Cocking at [place] and he changed Prednisone to 10 mg taper and added Methocarbamol 500 mg and Acyclovir 400 mg  She reports excellent compliance with treatment. She reports this condition is slightly improved.  ------------------------------------------------------------------------------------  Past Medical History:  Diagnosis Date  . Abnormal stress test    a. 10/2009: Normal myocardial perfusion imaging; b. 08/2015 MV: medium defect of mod severity in mid ant apical region w/ mild HK of the distal inf wall;  c. 08/2015 Cath: nl Cors, EF 60%.  . Anemia   . Anxiety   . Arthritis   . Depression   . Edema   . Essential hypertension   . Frequent urination   . Frequent urination at night   . Headache    migraines, gets botox injections  . Heart palpitations June 2013   Event monitor showing sinus tachycardia, PACs with couplets and triplets.  Marland Kitchen Hx of echocardiogram    a. 10/2009 Echo: showed normal left ventricular function, mild left ventricular hypertrophy, and no significant valve abnormalities.  . Hypercholesterolemia   . Migraine   . Morbid obesity ([place])   . Neuromuscular disorder ([place])   . OSA (obstructive sleep apnea)    has been on continuous positive airway pressure  . Seizure disorder ([place])   . Seizures ([place])   . Type II diabetes mellitus ([place])    Past Surgical History:  Procedure Laterality Date  . ABDOMINAL HYSTERECTOMY  1990   without BSO  . ABDOMINAL HYSTERECTOMY  1990  . BACK  SURGERY  1900s 2001   x2 with "cage put in"  . BACK SURGERY  2001  . BILATERAL SALPINGOOPHORECTOMY  1990  . CARDIAC CATHETERIZATION N/A 08/06/2015   Procedure: Left Heart Cath and Coronary Angiography;  Surgeon: Troy Sine, MD;  Location: [place] [facility];  Service: Cardiovascular;  Laterality: N/A;  . CARPAL TUNNEL RELEASE Bilateral   . CHOLECYSTECTOMY  1980s  . CHOLECYSTECTOMY  1980  . DORSAL COMPARTMENT RELEASE Left 10/25/2014   Procedure: LEFT FIRST  DORSAL COMPARTMENT RELEASE AND RADIAL TENOSYNOVECTOMY ;  Surgeon: Roseanne Kaufman, MD;  Location: [facility];  Service: Orthopedics;  Laterality: Left;  . HAND SURGERY    . KNEE SURGERY Right 2012   meniscus tear  . KNEE SURGERY  2012  . LAMINECTOMY  1995  . TUBAL LIGATION  1982  . WRIST SURGERY Right    Family History  Problem Relation Age of Onset  . Sarcoidosis Mother   . Seizures Mother   . Heart attack Mother   . Alzheimer's disease Maternal Grandmother   . Dementia Maternal Grandmother   . Hypertension Maternal Grandfather   . Diabetes Maternal Grandfather    Allergies  Allergen Reactions  . Morphine And Related Hives    Nausea and vomiting   . Oxycontin [Oxycodone Hcl] Swelling, Hives and Itching     Previous Medications   ACYCLOVIR (ZOVIRAX) 400 MG TABLET    Take 400 mg by mouth 5 (five)  times daily.   BACLOFEN (LIORESAL) 10 MG TABLET    Take 1 tablet (10 mg total) by mouth 3 (three) times daily.   BOTOX 100 UNITS SOLR INJECTION    Inject into the muscle every 3 (three) months.    CHLORPROMAZINE (THORAZINE) 25 MG TABLET    Take 25 mg by mouth 3 (three) times daily as needed (migraines).    FLUOCINONIDE OINTMENT (LIDEX) 0.05 %    Apply 1 application topically 2 (two) times daily.   FUROSEMIDE (LASIX) 20 MG TABLET    Take 1 tablet (20 mg total) by mouth daily as needed for fluid or edema.   HYDROMORPHONE (DILAUDID) 2 MG TABLET    Take 1 tablet (2 mg total) by mouth every 4 (four) hours as needed for  moderate pain or severe pain.   KETOCONAZOLE (NIZORAL) 2 % SHAMPOO    Apply 1 application topically 2 (two) times a week.   LAMOTRIGINE (LAMICTAL) 100 MG TABLET    Take 100 mg by mouth 2 (two) times daily.   LITHIUM 300 MG TABLET    Take 2 tablets (600 mg total) by mouth at bedtime.   METFORMIN (GLUCOPHAGE) 500 MG TABLET    TAKE 2 TABLETS IN THE MORNING, 1 TABLET AT LUNCH TIME AND 2 TABLETS IN THE EVENING   METHOCARBAMOL (ROBAXIN) 500 MG TABLET    Take 500 mg by mouth.   METOPROLOL (LOPRESSOR) 50 MG TABLET    TAKE 1 TABLET (50 MG TOTAL) BY MOUTH 2 (TWO) TIMES DAILY.   POTASSIUM CHLORIDE (MICRO-K) 10 MEQ CR CAPSULE    Take 1 capsule (10 mEq total) by mouth daily as needed.   PREDNISONE (DELTASONE) 10 MG TABLET    Take 10 mg by mouth.   TEGRETOL-XR 400 MG 12 HR TABLET    TAKE 1 TABLET BY MOUTH TWICE DAILY   TRIAMCINOLONE CREAM (KENALOG) 0.1 %    Apply 1 application topically 2 (two) times daily.   VILAZODONE HCL (VIIBRYD) 40 MG TABS    Take 1 tablet (40 mg total) by mouth daily.   VYTORIN 10-40 MG TABLET    TAKE 1 TABLET BY MOUTH AT BEDTIME    Review of Systems  Constitutional: Negative.   Respiratory: Negative.   Cardiovascular: Negative.   Musculoskeletal:       Facial droop     Social History  Substance Use Topics  . Smoking status: Former Smoker    Types: Cigarettes    Quit date: 01/03/1979  . Smokeless tobacco: Never Used     Comment: quit in 1984  . Alcohol use No   Objective:   BP (!) 158/92 (BP Location: Right Arm, Patient Position: Sitting, Cuff Size: Normal)   Pulse 62   Temp 98.3 F (36.8 C) (Oral)   Resp 14   Wt 219 lb 12.8 oz (99.7 kg)   LMP  (LMP Unknown)   BMI 38.94 kg/m  Wt Readings from Last 3 Encounters:  02/02/16 219 lb 12.8 oz (99.7 kg)  01/23/16 211 lb (95.7 kg)  12/05/15 214 lb (97.1 kg)    Physical Exam  Constitutional: She is oriented to person, place, and time. She appears well-developed and well-nourished.  HENT:  Head: Normocephalic.  Eyes:  Conjunctivae are normal.  Neck: Neck supple. No JVD present.  Cardiovascular: Normal rate and regular rhythm.   Pulmonary/Chest: Effort normal and breath sounds normal.  Abdominal: Soft. Bowel sounds are normal.  Musculoskeletal: She exhibits deformity.  Right facial weakness forehead to right jaw.  Slight tearing of the right eye and decreased ability to completely close the right eye. No weakness in arms or legs.  Lymphadenopathy:    She has no cervical adenopathy.  Neurological: She is alert and oriented to person, place, and time.      Assessment & Plan:     1. Right-sided Bell's palsy Onset 01-23-16 and evaluated at the hospital with CT scan and MRI of head. No sign of stroke, hemorrhage or intracranial lesions. Her neurologist (Dr. Domingo Cocking) changed her prednisone dosage and added Acyclovir with Methocarbamol for persistent headache. Will finish these meds and follow up with him in a month to evaluate return of right facial muscle strength.  2. Type 2 diabetes mellitus without complication, without long-term current use of insulin (HCC) Blood sugars have been higher, urinating more and thirsty since the occurrence of the Bell's Palsy and use of prednisone. Encouraged to watch diet closer, walk for exercise and continue Metformin 500 mg 2 breakfast, 1 lunch and 2 supper. Hgb A1C 8.6% today. Want to work on diet and check FBS regularly. If FBS over 200 regularly, will need to consider basal insulin. Recheck regularly in 3 months. - POCT glycosylated hemoglobin (Hb A1C) - glucose blood test strip; Use as instructed  Dispense: 100 each; Refill: 12 - Lancets (ONETOUCH ULTRASOFT) lancets; Use as instructed  Dispense: 100 each; Refill: 12  3. Congestive heart failure, unspecified congestive heart failure chronicity, unspecified congestive heart failure type ([place]) Has gained 8 lbs since the onset of Bell's Palsy. May be extra fluid retention from prednisone. EKG in the ER on 01-23-16 was  essentially normal. May use Lasix prn edema and follow up with cardiologist as planned.

## 2016-02-12 DIAGNOSIS — H18603 Keratoconus, unspecified, bilateral: Secondary | ICD-10-CM | POA: Diagnosis not present

## 2016-02-12 DIAGNOSIS — E119 Type 2 diabetes mellitus without complications: Secondary | ICD-10-CM | POA: Diagnosis not present

## 2016-02-12 LAB — HM DIABETES EYE EXAM

## 2016-02-17 ENCOUNTER — Other Ambulatory Visit: Payer: Self-pay | Admitting: Psychiatry

## 2016-02-17 ENCOUNTER — Encounter: Payer: Self-pay | Admitting: Family Medicine

## 2016-02-17 DIAGNOSIS — F331 Major depressive disorder, recurrent, moderate: Secondary | ICD-10-CM

## 2016-02-18 ENCOUNTER — Telehealth: Payer: Self-pay

## 2016-02-18 NOTE — Telephone Encounter (Signed)
left message on dr. Awilda Metro for viibryd 40mg  ok for generic and #30 no additional refills.

## 2016-02-18 NOTE — Telephone Encounter (Signed)
pt called stated that she needed a refill on her viibryd  pt has appt  03-01-16

## 2016-02-19 ENCOUNTER — Other Ambulatory Visit: Payer: Self-pay | Admitting: Family Medicine

## 2016-02-26 ENCOUNTER — Other Ambulatory Visit: Payer: Self-pay | Admitting: Family Medicine

## 2016-02-26 ENCOUNTER — Other Ambulatory Visit: Payer: Self-pay | Admitting: Cardiovascular Disease

## 2016-02-26 DIAGNOSIS — G43719 Chronic migraine without aura, intractable, without status migrainosus: Secondary | ICD-10-CM | POA: Diagnosis not present

## 2016-03-01 ENCOUNTER — Ambulatory Visit: Payer: 59 | Admitting: Psychiatry

## 2016-03-02 ENCOUNTER — Ambulatory Visit: Payer: 59 | Admitting: Psychiatry

## 2016-03-02 DIAGNOSIS — M4316 Spondylolisthesis, lumbar region: Secondary | ICD-10-CM | POA: Diagnosis not present

## 2016-03-02 DIAGNOSIS — I10 Essential (primary) hypertension: Secondary | ICD-10-CM | POA: Diagnosis not present

## 2016-03-02 DIAGNOSIS — M5442 Lumbago with sciatica, left side: Secondary | ICD-10-CM | POA: Diagnosis not present

## 2016-03-02 DIAGNOSIS — M5441 Lumbago with sciatica, right side: Secondary | ICD-10-CM | POA: Diagnosis not present

## 2016-03-04 ENCOUNTER — Encounter: Payer: Self-pay | Admitting: Psychiatry

## 2016-03-04 ENCOUNTER — Ambulatory Visit (INDEPENDENT_AMBULATORY_CARE_PROVIDER_SITE_OTHER): Payer: 59 | Admitting: Psychiatry

## 2016-03-04 VITALS — BP 153/84 | HR 67 | Temp 98.5°F | Wt 218.6 lb

## 2016-03-04 DIAGNOSIS — F411 Generalized anxiety disorder: Secondary | ICD-10-CM

## 2016-03-04 DIAGNOSIS — F331 Major depressive disorder, recurrent, moderate: Secondary | ICD-10-CM | POA: Diagnosis not present

## 2016-03-04 MED ORDER — VILAZODONE HCL 40 MG PO TABS: 40.0000 mg | ORAL_TABLET | Freq: Every day | ORAL | 4 refills | Status: DC

## 2016-03-04 NOTE — Progress Notes (Signed)
Patient ID: Abigail Figueroa, female   DOB: 12/15/1960, 55 y.o.   MRN: [id number] [facility] MD/PA/NP OP Progress Note  03/04/2016 9:14 AM Abigail Figueroa  MRN:  [id number]  Subjective:  Patient returns for follow-up of her major depressive disorder, recurrent moderate. She reports doing okay overall and that she had her back surgery back in April. States she is doing a little better. States that she was having a tremor and her lithium was decreased to 300 mg after a phone call with this clinician. States that she continues to have some tremors. Reports her mood is stable but she was feeling stressed there for a while. This has to do with her husband not having a job at that time. she takes Tegretol for seizures and Lamictal for migraines and these are prescribed by her other physicians.  Chief Complaint: Family stress Chief Complaint    Follow-up; Medication Refill    depression Visit Diagnosis:     ICD-9-CM ICD-10-CM   1. Major depressive disorder, recurrent episode, moderate (HCC) 296.32 F33.1   2. Generalized anxiety disorder 300.02 F41.1     Past Medical History:  Past Medical History:  Diagnosis Date  . Abnormal stress test    a. 10/2009: Normal myocardial perfusion imaging; b. 08/2015 MV: medium defect of mod severity in mid ant apical region w/ mild HK of the distal inf wall;  c. 08/2015 Cath: nl Cors, EF 60%.  . Anemia   . Anxiety   . Arthritis   . Bell's palsy   . Depression   . Edema   . Essential hypertension   . Frequent urination   . Frequent urination at night   . Headache    migraines, gets botox injections  . Heart palpitations June 2013   Event monitor showing sinus tachycardia, PACs with couplets and triplets.  Marland Kitchen Hx of echocardiogram    a. 10/2009 Echo: showed normal left ventricular function, mild left ventricular hypertrophy, and no significant valve abnormalities.  . Hypercholesterolemia   . Migraine   . Morbid obesity ([place])   . Neuromuscular disorder ([place])   .  OSA (obstructive sleep apnea)    has been on continuous positive airway pressure  . Seizure disorder ([place])   . Seizures ([place])   . Type II diabetes mellitus ([place])     Past Surgical History:  Procedure Laterality Date  . ABDOMINAL HYSTERECTOMY  1990   without BSO  . ABDOMINAL HYSTERECTOMY  1990  . BACK SURGERY  1900s 2001   x2 with "cage put in"  . BACK SURGERY  2001  . BILATERAL SALPINGOOPHORECTOMY  1990  . CARDIAC CATHETERIZATION N/A 08/06/2015   Procedure: Left Heart Cath and Coronary Angiography;  Surgeon: Troy Sine, MD;  Location: [place] [facility];  Service: Cardiovascular;  Laterality: N/A;  . CARPAL TUNNEL RELEASE Bilateral   . CHOLECYSTECTOMY  1980s  . CHOLECYSTECTOMY  1980  . DORSAL COMPARTMENT RELEASE Left 10/25/2014   Procedure: LEFT FIRST  DORSAL COMPARTMENT RELEASE AND RADIAL TENOSYNOVECTOMY ;  Surgeon: Roseanne Kaufman, MD;  Location: [place];  Service: Orthopedics;  Laterality: Left;  . HAND SURGERY    . KNEE SURGERY Right 2012   meniscus tear  . KNEE SURGERY  2012  . LAMINECTOMY  1995  . TUBAL LIGATION  1982  . WRIST SURGERY Right    Family History:  Family History  Problem Relation Age of Onset  . Sarcoidosis Mother   . Seizures Mother   . Heart  attack Mother   . Alzheimer's disease Maternal Grandmother   . Dementia Maternal Grandmother   . Hypertension Maternal Grandfather   . Diabetes Maternal Grandfather    Social History:  Social History   Social History  . Marital status: Married    Spouse name: N/A  . Number of children: N/A  . Years of education: N/A   Social History Main Topics  . Smoking status: Former Smoker    Types: Cigarettes    Quit date: 01/03/1979  . Smokeless tobacco: Never Used     Comment: quit in 1984  . Alcohol use No  . Drug use: No  . Sexual activity: No   Other Topics Concern  . None   Social History Narrative   ** Merged History Encounter **       She is a married mother of 2, grandmother 3.  She tries to get exercise but is not doing any routine program.   She quit smoking over 25 years ago does not drink alcohol.   Additional History:   Assessment:   Musculoskeletal: Strength & Muscle Tone: within normal limits Gait & Station: Slow and was ambulating with a walker Patient leans: N/A  Psychiatric Specialty Exam: HPI  Review of Systems  Psychiatric/Behavioral: Negative for hallucinations, memory loss, substance abuse and suicidal ideas. Depression: depressionis somewhat related  dealing with her back pain. The patient is not nervous/anxious and does not have insomnia.     Blood pressure (!) 153/84, pulse 67, temperature 98.5 F (36.9 C), temperature source Oral, weight 218 lb 9.6 oz (99.2 kg).Body mass index is 38.72 kg/m.  General Appearance: Well Groomed  Eye Contact:  Good  Speech:  Clear and Coherent and Normal Rate  Volume:  Normal  Mood:  Okay  Affect:  constricted  Thought Process:  Linear  Orientation:  Full (Time, Place, and Person)  Thought Content:  Negative  Suicidal Thoughts:  No  Homicidal Thoughts:  No  Memory:  Immediate;   Good Recent;   Good Remote;   Good  Judgement:  Good  Insight:  Good  Psychomotor Activity:  Negative  Concentration:  Good  Recall:  Good  Fund of Knowledge: Good  Language: Good  Akathisia:  Negative  Handed:  Right  AIMS (if indicated):  Not done  Assets:  Communication Skills Desire for Improvement Social Support  ADL's:  Intact  Cognition: WNL  Sleep:  Poor, not using CPAP, daytime napping   Is the patient at risk to self?  No. Has the patient been a risk to self in the past 6 months?  No. Has the patient been a risk to self within the distant past?  No. Is the patient a risk to others?  No. Has the patient been a risk to others in the past 6 months?  No. Has the patient been a risk to others within the distant past?  No.  Current Medications: Current Outpatient Prescriptions  Medication Sig Dispense  Refill  . acyclovir (ZOVIRAX) 400 MG tablet Take 400 mg by mouth 5 (five) times daily.    . baclofen (LIORESAL) 10 MG tablet Take 1 tablet (10 mg total) by mouth 3 (three) times daily. (Patient taking differently: Take 10 mg by mouth 3 (three) times daily as needed (Headaches). ) 30 each 0  . BOTOX 100 UNITS SOLR injection Inject into the muscle every 3 (three) months.     . chlorproMAZINE (THORAZINE) 25 MG tablet Take 25 mg by mouth 3 (three) times daily  as needed (migraines).     . fluocinonide ointment (LIDEX) 3.66 % Apply 1 application topically 2 (two) times daily. (Patient taking differently: Apply 1 application topically 2 (two) times daily as needed. ) 30 g 1  . furosemide (LASIX) 20 MG tablet Take 1 tablet (20 mg total) by mouth daily as needed for fluid or edema. (Patient taking differently: Take 20 mg by mouth daily. ) 30 tablet 5  . glucose blood test strip Use as instructed 100 each 12  . HYDROmorphone (DILAUDID) 2 MG tablet Take 1 tablet (2 mg total) by mouth every 4 (four) hours as needed for moderate pain or severe pain. 50 tablet 0  . ketoconazole (NIZORAL) 2 % shampoo Apply 1 application topically 2 (two) times a week.    . lamoTRIgine (LAMICTAL) 100 MG tablet Take 100 mg by mouth 2 (two) times daily.    . Lancets (ONETOUCH ULTRASOFT) lancets Use as instructed 100 each 12  . lithium 300 MG tablet Take 2 tablets (600 mg total) by mouth at bedtime. 60 tablet 4  . metFORMIN (GLUCOPHAGE) 500 MG tablet TAKE 2 TABLETS IN THE MORNING, 1 TABLET AT LUNCH TIME AND 2 TABLETS IN THE EVENING 150 tablet 3  . methocarbamol (ROBAXIN) 500 MG tablet Take 500 mg by mouth.    . metoprolol (LOPRESSOR) 50 MG tablet TAKE 1 TABLET (50 MG TOTAL) BY MOUTH 2 (TWO) TIMES DAILY. 60 tablet 0  . potassium chloride (MICRO-K) 10 MEQ CR capsule Take 1 capsule (10 mEq total) by mouth daily as needed. (Patient taking differently: Take 10 mEq by mouth daily. ) 30 capsule 5  . predniSONE (DELTASONE) 10 MG tablet Take  10 mg by mouth.    . TEGRETOL-XR 400 MG 12 hr tablet TAKE 1 TABLET BY MOUTH TWICE DAILY 60 tablet 4  . triamcinolone cream (KENALOG) 0.1 % Apply 1 application topically 2 (two) times daily.    . Vilazodone HCl (VIIBRYD) 40 MG TABS Take 1 tablet (40 mg total) by mouth daily. 30 tablet 4  . VYTORIN 10-40 MG tablet TAKE 1 TABLET BY MOUTH AT BEDTIME 30 tablet 11   No current facility-administered medications for this visit.     Medical Decision Making:  Established Problem, Stable/Improving (1) and Review or order clinical lab tests (1)  Treatment Plan Summary:Medication management   Major depressive disorder, recurrent, moderate-Continue Viibryd 40 mg daily.  Discontinue lithium  Labs- lithium level was at 0.9 and April 2017. Her thyroid function tests and knee function tests were normal  Patient takes Lamictal and chlorpromazine and Tegretol for migraine treatment. These are prescribed by another physician. Discussed with patient that she is on 2 different mood stabilizers for migraine and her seizures. We will continue to monitor her on these medications and will not restart anything at this time.   She's been encouraged call any questions or concerns prior to her next appointment. Return to clinic in 3 months time.  Mabry Santarelli 03/04/2016, 9:14 AM

## 2016-03-17 ENCOUNTER — Other Ambulatory Visit: Payer: Self-pay | Admitting: Family Medicine

## 2016-03-17 DIAGNOSIS — L309 Dermatitis, unspecified: Secondary | ICD-10-CM

## 2016-03-19 NOTE — Telephone Encounter (Signed)
RX sent to pharmacy  

## 2016-03-19 NOTE — Telephone Encounter (Signed)
Pt contacted office for refill request on the following medications: fluocinonide ointment (LIDEX) 0.05 %  Last written: 04/10/15 with 1 refill. Pt stated that she needs this sent in today if possible. Her hands are hurting and bleeding some. Pt stated this cream really helped in the past. The pharmacy sent request on 03/17/16. Please advise. Thanks TNP

## 2016-03-27 ENCOUNTER — Other Ambulatory Visit: Payer: Self-pay | Admitting: Cardiovascular Disease

## 2016-03-29 NOTE — Telephone Encounter (Signed)
Rx request sent to pharmacy.  

## 2016-04-02 ENCOUNTER — Other Ambulatory Visit: Payer: Self-pay | Admitting: Pharmacist

## 2016-04-02 NOTE — Patient Outreach (Signed)
Outreach call to Abigail Figueroa regarding her request for follow up from the [facility] Medication Adherence Campaign. Called and spoke with patient. HIPAA identifiers verified and verbal consent received.   [name] reports that she takes her cholesterol medication nightly at bedtime, as directed. Reports that she believes that she misses a dose no more than once or twice a month. Discuss with patient strategies for helping her to remember to take her medications. Discuss with patient the importance of medication adherence. Patient verbalizes understanding. Patient denies any issues with medication side effects or cost.    Provide patient with my phone number for future medication questions or concerns.  Harlow Asa, PharmD Clinical Pharmacist [facility] [phone]

## 2016-04-06 ENCOUNTER — Ambulatory Visit (INDEPENDENT_AMBULATORY_CARE_PROVIDER_SITE_OTHER): Payer: Medicare Other | Admitting: Family Medicine

## 2016-04-06 ENCOUNTER — Encounter: Payer: Self-pay | Admitting: Family Medicine

## 2016-04-06 ENCOUNTER — Other Ambulatory Visit: Payer: Self-pay

## 2016-04-06 VITALS — BP 152/102 | HR 88 | Temp 100.0°F | Resp 16 | Wt 221.4 lb

## 2016-04-06 DIAGNOSIS — J039 Acute tonsillitis, unspecified: Secondary | ICD-10-CM

## 2016-04-06 DIAGNOSIS — R5381 Other malaise: Secondary | ICD-10-CM

## 2016-04-06 LAB — POCT INFLUENZA A/B
Influenza A, POC: NEGATIVE
Influenza B, POC: NEGATIVE

## 2016-04-06 LAB — POCT RAPID STREP A (OFFICE): RAPID STREP A SCREEN: NEGATIVE

## 2016-04-06 MED ORDER — LIDOCAINE VISCOUS 2 % MT SOLN: 5.0000 mL | Freq: Four times a day (QID) | OROMUCOSAL | 0 refills | Status: DC | PRN

## 2016-04-06 MED ORDER — AMOXICILLIN-POT CLAVULANATE 875-125 MG PO TABS: 1.0000 | ORAL_TABLET | Freq: Two times a day (BID) | ORAL | 0 refills | Status: DC

## 2016-04-06 NOTE — Progress Notes (Signed)
Patient: Abigail Figueroa Female    DOB: 1960/05/31   55 y.o.   MRN: [id number] Visit Date: 04/06/2016  Today's Provider: Vernie Murders, PA   Chief Complaint  Patient presents with  . URI   Subjective:    URI   This is a new problem. Episode onset: 3 days ago. The problem has been unchanged. The maximum temperature recorded prior to her arrival was 100.4 - 100.9 F. Associated symptoms include ear pain and a sore throat. Associated symptoms comments: Fatigue, body aches, and back pain . She has tried nothing for the symptoms.   Past Medical History:  Diagnosis Date  . Abnormal stress test    a. 10/2009: Normal myocardial perfusion imaging; b. 08/2015 MV: medium defect of mod severity in mid ant apical region w/ mild HK of the distal inf wall;  c. 08/2015 Cath: nl Cors, EF 60%.  . Anemia   . Anxiety   . Arthritis   . Bell's palsy   . Depression   . Edema   . Essential hypertension   . Frequent urination   . Frequent urination at night   . Headache    migraines, gets botox injections  . Heart palpitations June 2013   Event monitor showing sinus tachycardia, PACs with couplets and triplets.  Marland Kitchen Hx of echocardiogram    a. 10/2009 Echo: showed normal left ventricular function, mild left ventricular hypertrophy, and no significant valve abnormalities.  . Hypercholesterolemia   . Migraine   . Morbid obesity ([place])   . Neuromuscular disorder ([place])   . OSA (obstructive sleep apnea)    has been on continuous positive airway pressure  . Seizure disorder ([place])   . Seizures ([place])   . Type II diabetes mellitus ([place])    Past Surgical History:  Procedure Laterality Date  . ABDOMINAL HYSTERECTOMY  1990   without BSO  . ABDOMINAL HYSTERECTOMY  1990  . BACK SURGERY  1900s 2001   x2 with "cage put in"  . BACK SURGERY  2001  . BILATERAL SALPINGOOPHORECTOMY  1990  . CARDIAC CATHETERIZATION N/A 08/06/2015   Procedure: Left Heart Cath and Coronary Angiography;  Surgeon: Troy Sine, MD;   Location: [facility];  Service: Cardiovascular;  Laterality: N/A;  . CARPAL TUNNEL RELEASE Bilateral   . CHOLECYSTECTOMY  1980s  . CHOLECYSTECTOMY  1980  . DORSAL COMPARTMENT RELEASE Left 10/25/2014   Procedure: LEFT FIRST  DORSAL COMPARTMENT RELEASE AND RADIAL TENOSYNOVECTOMY ;  Surgeon: Roseanne Kaufman, MD;  Location: [facility];  Service: Orthopedics;  Laterality: Left;  . HAND SURGERY    . KNEE SURGERY Right 2012   meniscus tear  . KNEE SURGERY  2012  . LAMINECTOMY  1995  . TUBAL LIGATION  1982  . WRIST SURGERY Right    Family History  Problem Relation Age of Onset  . Sarcoidosis Mother   . Seizures Mother   . Heart attack Mother   . Alzheimer's disease Maternal Grandmother   . Dementia Maternal Grandmother   . Hypertension Maternal Grandfather   . Diabetes Maternal Grandfather    Allergies  Allergen Reactions  . Morphine And Related Hives    Nausea and vomiting   . Oxycontin [Oxycodone Hcl] Swelling, Hives and Itching     Previous Medications   ACYCLOVIR (ZOVIRAX) 400 MG TABLET    Take 400 mg by mouth 5 (five) times daily.   BACLOFEN (LIORESAL) 10 MG TABLET    Take 1 tablet (10 mg  total) by mouth 3 (three) times daily.   BOTOX 100 UNITS SOLR INJECTION    Inject into the muscle every 3 (three) months.    EZETIMIBE-SIMVASTATIN (VYTORIN) 10-40 MG TABLET    Take 1 tablet by mouth at bedtime. Please schedule appointment for refills.   FLUOCINONIDE OINTMENT (LIDEX) 0.05 %    APPLY 1 APPLICATION TOPICALLY 2 (TWO) TIMES DAILY.   FUROSEMIDE (LASIX) 20 MG TABLET    Take 1 tablet (20 mg total) by mouth daily as needed for fluid or edema.   GLUCOSE BLOOD TEST STRIP    Use as instructed   HYDROMORPHONE (DILAUDID) 2 MG TABLET    Take 1 tablet (2 mg total) by mouth every 4 (four) hours as needed for moderate pain or severe pain.   KETOCONAZOLE (NIZORAL) 2 % SHAMPOO    Apply 1 application topically 2 (two) times a week.   LAMOTRIGINE (LAMICTAL) 100 MG TABLET     Take 100 mg by mouth 2 (two) times daily.   LANCETS (ONETOUCH ULTRASOFT) LANCETS    Use as instructed   METFORMIN (GLUCOPHAGE) 500 MG TABLET    TAKE 2 TABLETS IN THE MORNING, 1 TABLET AT LUNCH TIME AND 2 TABLETS IN THE EVENING   METHOCARBAMOL (ROBAXIN) 500 MG TABLET    Take 500 mg by mouth.   METOPROLOL (LOPRESSOR) 50 MG TABLET    Take 1 tablet (50 mg total) by mouth 2 (two) times daily. Please schedule appointment for refills.   POTASSIUM CHLORIDE (MICRO-K) 10 MEQ CR CAPSULE    Take 1 capsule (10 mEq total) by mouth daily as needed.   PREDNISONE (DELTASONE) 10 MG TABLET    Take 10 mg by mouth.   TEGRETOL-XR 400 MG 12 HR TABLET    TAKE 1 TABLET BY MOUTH TWICE DAILY   TRIAMCINOLONE CREAM (KENALOG) 0.1 %    Apply 1 application topically 2 (two) times daily.   VILAZODONE HCL (VIIBRYD) 40 MG TABS    Take 1 tablet (40 mg total) by mouth daily.    Review of Systems  Constitutional: Positive for fatigue and fever.  HENT: Positive for ear pain and sore throat.   Respiratory: Negative.   Cardiovascular: Negative.   Musculoskeletal: Positive for back pain.    Social History  Substance Use Topics  . Smoking status: Former Smoker    Types: Cigarettes    Quit date: 01/03/1979  . Smokeless tobacco: Never Used     Comment: quit in 1984  . Alcohol use No   Objective:   BP (!) 152/102 (BP Location: Right Arm, Patient Position: Sitting, Cuff Size: Normal)   Pulse 88   Temp 100 F (37.8 C) (Oral)   Resp 16   Wt 221 lb 6.4 oz (100.4 kg)   LMP  (LMP Unknown)   BMI 39.22 kg/m   Physical Exam  Constitutional: She is oriented to person, place, and time. She appears well-developed and well-nourished.  HENT:  Head: Normocephalic.  Right Ear: External ear normal.  Left Ear: External ear normal.  Fiery red and swollen left tonsil with a yellow ulcer on it.   Eyes: Conjunctivae are normal.  Neck: Neck supple.  Painful and swollen left submandibular node.  Cardiovascular: Normal rate and regular  rhythm.   Pulmonary/Chest: Effort normal and breath sounds normal.  Abdominal: Soft. Bowel sounds are normal.  Lymphadenopathy:    She has cervical adenopathy.  Neurological: She is oriented to person, place, and time.  Skin: No rash noted.  Assessment & Plan:     1. Acute tonsillitis, unspecified etiology Onset 2-3 days ago with sore throat, body aches, fever and fatigue. Left tonsil fiery red and swollen with a large ulcer. Will treat with Augmentin and Viscous Xylocaine. Increase fluids and may use Tylenol for fever and body aches. Recheck prn. - POCT rapid strep A - amoxicillin-clavulanate (AUGMENTIN) 875-125 MG tablet; Take 1 tablet by mouth 2 (two) times daily.  Dispense: 20 tablet; Refill: 0 - lidocaine (XYLOCAINE) 2 % solution; Use as directed 5 mLs in the mouth or throat every 6 (six) hours as needed (throat pain).  Dispense: 100 mL; Refill: 0  2. Malaise Negative flu test. Suspect secondary to tonsillitis. Recheck prn. - POCT Influenza A/B

## 2016-04-22 DIAGNOSIS — R51 Headache: Secondary | ICD-10-CM | POA: Diagnosis not present

## 2016-04-22 DIAGNOSIS — M791 Myalgia: Secondary | ICD-10-CM | POA: Diagnosis not present

## 2016-04-22 DIAGNOSIS — G43719 Chronic migraine without aura, intractable, without status migrainosus: Secondary | ICD-10-CM | POA: Diagnosis not present

## 2016-04-22 DIAGNOSIS — M542 Cervicalgia: Secondary | ICD-10-CM | POA: Diagnosis not present

## 2016-05-06 ENCOUNTER — Ambulatory Visit: Payer: Self-pay

## 2016-05-06 ENCOUNTER — Ambulatory Visit: Payer: Self-pay | Admitting: Family Medicine

## 2016-05-11 ENCOUNTER — Other Ambulatory Visit: Payer: Self-pay | Admitting: Cardiovascular Disease

## 2016-05-11 ENCOUNTER — Telehealth: Payer: Self-pay | Admitting: Cardiovascular Disease

## 2016-05-11 MED ORDER — METOPROLOL TARTRATE 50 MG PO TABS: 50.0000 mg | ORAL_TABLET | Freq: Two times a day (BID) | ORAL | 0 refills | Status: DC

## 2016-05-11 NOTE — Telephone Encounter (Signed)
°  New Prob   *STAT* If patient is at the pharmacy, call can be transferred to refill team.   1. Which medications need to be refilled? (please list name of each medication and dose if known)  metoprolol (LOPRESSOR) 50 MG tablet  2. Which pharmacy/location (including street and city if local pharmacy) is medication to be sent to? [facility] [place] [facility] 30 day or 90 day supply?  30 days    Follow Up appointment scheduled for 06/01/16 at 11:30 AM.

## 2016-05-11 NOTE — Telephone Encounter (Signed)
Med refilled.

## 2016-05-21 ENCOUNTER — Other Ambulatory Visit: Payer: Self-pay | Admitting: Cardiovascular Disease

## 2016-05-21 NOTE — Telephone Encounter (Signed)
Rx has been sent to the pharmacy electronically. ° °

## 2016-05-25 DIAGNOSIS — M4316 Spondylolisthesis, lumbar region: Secondary | ICD-10-CM | POA: Diagnosis not present

## 2016-05-28 DIAGNOSIS — G43719 Chronic migraine without aura, intractable, without status migrainosus: Secondary | ICD-10-CM | POA: Diagnosis not present

## 2016-05-31 DIAGNOSIS — M25532 Pain in left wrist: Secondary | ICD-10-CM | POA: Diagnosis not present

## 2016-05-31 DIAGNOSIS — M1812 Unilateral primary osteoarthritis of first carpometacarpal joint, left hand: Secondary | ICD-10-CM | POA: Diagnosis not present

## 2016-06-01 ENCOUNTER — Ambulatory Visit (INDEPENDENT_AMBULATORY_CARE_PROVIDER_SITE_OTHER): Payer: Medicare Other | Admitting: Cardiovascular Disease

## 2016-06-01 ENCOUNTER — Encounter: Payer: Self-pay | Admitting: Cardiovascular Disease

## 2016-06-01 VITALS — BP 142/88 | HR 64 | Ht 63.0 in | Wt 219.0 lb

## 2016-06-01 DIAGNOSIS — Z79899 Other long term (current) drug therapy: Secondary | ICD-10-CM

## 2016-06-01 DIAGNOSIS — I10 Essential (primary) hypertension: Secondary | ICD-10-CM

## 2016-06-01 DIAGNOSIS — E669 Obesity, unspecified: Secondary | ICD-10-CM

## 2016-06-01 DIAGNOSIS — E782 Mixed hyperlipidemia: Secondary | ICD-10-CM | POA: Diagnosis not present

## 2016-06-01 DIAGNOSIS — E1169 Type 2 diabetes mellitus with other specified complication: Secondary | ICD-10-CM

## 2016-06-01 DIAGNOSIS — G4733 Obstructive sleep apnea (adult) (pediatric): Secondary | ICD-10-CM

## 2016-06-01 MED ORDER — METOPROLOL TARTRATE 50 MG PO TABS: 75.0000 mg | ORAL_TABLET | Freq: Two times a day (BID) | ORAL | 6 refills | Status: DC

## 2016-06-01 NOTE — Patient Instructions (Addendum)
Your physician recommends that you return for lab work fasting.  Your physician has recommended that you have a sleep study. This test records several body functions during sleep, including: brain activity, eye movement, oxygen and carbon dioxide blood levels, heart rate and rhythm, breathing rate and rhythm, the flow of air through your mouth and nose, snoring, body muscle movements, and chest and belly movement.  Your physician has recommended you make the following change in your medication:   1.) the metoprolol ( lopressor) has been increased to 75 mg twice a day. ( 1.5 tablets)   Your physician recommends that you schedule a follow-up appointment in: 4 months.

## 2016-06-02 NOTE — Progress Notes (Signed)
Patient ID: Abigail Figueroa, female   DOB: 06/11/1960, 57 y.o.   MRN: [id number]     PCP: Fannie Knee, PA  HPI: Abigail Figueroa is a 56 y.o. female who presents for a 15 month follow-up evaluation.  Abigail Figueroa has a history of hypertension, palpitations, peripheral edema, hyperlipidemia, type 2 diabetes mellitus, depression, as well as migraines. In 2007, she underwent a sleep study which was interpreted by Dr. Chancy Milroy. I do not have the diagnostic polysomnogram report but I do have the CPAP titration trial. She ultimately was titrated up to an 8 cm water pressure. She states she initially used CPAP therapy for several years but had not used in years.   When I saw her in October 2014  her sleep had been very poor for several years and she had with frequent awakenings and loud snoring.  At that time, Her Epworth sleepiness scale revealed severe excessive daytime sleepiness.  Epworth Sleepiness Scale: Situation   Chance of Dozing/Sleeping (0 = never , 1 = slight chance , 2 = moderate chance , 3 = high chance )   sitting and reading 3   watching TV 3   sitting inactive in a public place 3   being a passenger in a motor vehicle for an hour or more 3   lying down in the afternoon 3   sitting and talking to someone 3   sitting quietly after lunch (no alcohol) 3   while stopped for a few minutes in traffic as the driver 0   Total Score  21   Subsequently, she started on a new machine with an S9 Elite series.  Initially she use this, but ultimately again stopped using treatment.  Presently, she goes to bed at 9:30, but wakes up several times to take her dogs out to go to the bathroom.  Typically is up between 6 AM and 8 AM.  In April 2017, she underwent preoperative clearance nuclear study prior to undergoing lumbar back surgery.  This study was interpreted as intermediate risk with a possible defect in the mid, distal anterior wall and apex and there was distal inferior hypokinesis.  She  ultimately was referred for cardiac catheterization which I performed on 08/06/2015 and revealed normal coronary arteries.  She was admitted to [facility] in September 2017 with Bell's palsy.  She was treated with steroids.  She denies recurrent episodes of chest pain.  She denies palpitations.  She presents for reevaluation.  Past Medical History:  Diagnosis Date  . Abnormal stress test    a. 10/2009: Normal myocardial perfusion imaging; b. 08/2015 MV: medium defect of mod severity in mid ant apical region w/ mild HK of the distal inf wall;  c. 08/2015 Cath: nl Cors, EF 60%.  . Anemia   . Anxiety   . Arthritis   . Bell's palsy   . Depression   . Edema   . Essential hypertension   . Frequent urination   . Frequent urination at night   . Headache    migraines, gets botox injections  . Heart palpitations June 2013   Event monitor showing sinus tachycardia, PACs with couplets and triplets.  Marland Kitchen Hx of echocardiogram    a. 10/2009 Echo: showed normal left ventricular function, mild left ventricular hypertrophy, and no significant valve abnormalities.  . Hypercholesterolemia   . Migraine   . Morbid obesity ([place])   . Neuromuscular disorder ([place])   . OSA (obstructive sleep apnea)  has been on continuous positive airway pressure  . Seizure disorder ([place])   . Seizures ([place])   . Type II diabetes mellitus ([place])     Past Surgical History:  Procedure Laterality Date  . ABDOMINAL HYSTERECTOMY  1990   without BSO  . ABDOMINAL HYSTERECTOMY  1990  . BACK SURGERY  1900s 2001   x2 with "cage put in"  . BACK SURGERY  2001  . BILATERAL SALPINGOOPHORECTOMY  1990  . CARDIAC CATHETERIZATION N/A 08/06/2015   Procedure: Left Heart Cath and Coronary Angiography;  Surgeon: Troy Sine, MD;  Location: [facility];  Service: Cardiovascular;  Laterality: N/A;  . CARPAL TUNNEL RELEASE Bilateral   . CHOLECYSTECTOMY  1980s  . CHOLECYSTECTOMY  1980  . DORSAL COMPARTMENT  RELEASE Left 10/25/2014   Procedure: LEFT FIRST  DORSAL COMPARTMENT RELEASE AND RADIAL TENOSYNOVECTOMY ;  Surgeon: Roseanne Kaufman, MD;  Location: [facility];  Service: Orthopedics;  Laterality: Left;  . HAND SURGERY    . KNEE SURGERY Right 2012   meniscus tear  . KNEE SURGERY  2012  . LAMINECTOMY  1995  . TUBAL LIGATION  1982  . WRIST SURGERY Right     Allergies  Allergen Reactions  . Morphine And Related Hives    Nausea and vomiting   . Oxycontin [Oxycodone Hcl] Swelling, Hives and Itching    Current Outpatient Prescriptions  Medication Sig Dispense Refill  . baclofen (LIORESAL) 10 MG tablet Take 1 tablet (10 mg total) by mouth 3 (three) times daily. (Patient taking differently: Take 10 mg by mouth 3 (three) times daily as needed (Headaches). ) 30 each 0  . BOTOX 100 UNITS SOLR injection Inject into the muscle every 3 (three) months.     . CYCLOBENZAPRINE HCL PO Take 1 tablet by mouth daily as needed.    . ezetimibe-simvastatin (VYTORIN) 10-40 MG tablet TAKE 1 TABLET BY MOUTH AT BEDTIME. PLEASE SCHEDULE APPOINTMENT FOR REFILLS. 30 tablet 2  . fluocinonide ointment (LIDEX) 1.61 % APPLY 1 APPLICATION TOPICALLY 2 (TWO) TIMES DAILY. 30 g 1  . furosemide (LASIX) 20 MG tablet Take 1 tablet (20 mg total) by mouth daily as needed for fluid or edema. (Patient taking differently: Take 20 mg by mouth daily. ) 30 tablet 5  . gabapentin (NEURONTIN) 300 MG capsule Take 300 mg by mouth 3 (three) times daily.    Marland Kitchen glucose blood test strip Use as instructed 100 each 12  . lamoTRIgine (LAMICTAL) 100 MG tablet Take 100 mg by mouth 2 (two) times daily.    . Lancets (ONETOUCH ULTRASOFT) lancets Use as instructed 100 each 12  . metFORMIN (GLUCOPHAGE) 500 MG tablet TAKE 2 TABLETS IN THE MORNING, 1 TABLET AT LUNCH TIME AND 2 TABLETS IN THE EVENING 150 tablet 3  . metoprolol (LOPRESSOR) 50 MG tablet Take 1.5 tablets (75 mg total) by mouth 2 (two) times daily. 90 tablet 6  . potassium chloride  (MICRO-K) 10 MEQ CR capsule Take 1 capsule (10 mEq total) by mouth daily as needed. (Patient taking differently: Take 10 mEq by mouth daily. ) 30 capsule 5  . TEGRETOL-XR 400 MG 12 hr tablet TAKE 1 TABLET BY MOUTH TWICE DAILY 60 tablet 4  . triamcinolone cream (KENALOG) 0.1 % Apply 1 application topically 2 (two) times daily.    . Vilazodone HCl (VIIBRYD) 40 MG TABS Take 1 tablet (40 mg total) by mouth daily. 30 tablet 4   No current facility-administered medications for this visit.  Social history is notable that she is married has 2 children and 3 grandchildren. She does not routinely exercise. She quit smoking greater than 25 years ago. No alcohol use.  ROS General: Negative; No fevers, chills, or night sweats;  HEENT: Negative; No changes in vision or hearing, sinus congestion, difficulty swallowing Pulmonary: Negative; No cough, wheezing, shortness of breath, hemoptysis Cardiovascular: Positive for palpitations.  Positive for occasional ankle swelling. GI: Negative; No nausea, vomiting, diarrhea, or abdominal pain GU: Negative; No dysuria, hematuria, or difficulty voiding Musculoskeletal: Negative; no myalgias, joint pain, or weakness Hematologic/Oncology: Negative; no easy bruising, bleeding Endocrine: positive for diabetes Neuro: history of seizure disorder, stable.  Recently Skin: Negative; No rashes or skin lesions Psychiatric: Negative; No behavioral problems, depression Sleep: positive for obstructive sleep apnea ; In the past, her significant  daytime sleepiness significantly improved when she was using CPAP therapy no  bruxism, restless legs, hypnogognic hallucinations, no cataplexy Other comprehensive 14 point system review is negative.   PE BP (!) 142/88   Pulse 64   Ht _0  (1.6 m)   Wt 219 lb (99.3 kg)   LMP  (LMP Unknown)   BMI 38.79 kg/m   Wt Readings from Last 3 Encounters:  06/01/16 219 lb (99.3 kg)  04/06/16 221 lb 6.4 oz (100.4 kg)  02/02/16 219 lb  12.8 oz (99.7 kg)   General: Alert, oriented, no distress.  Skin: normal turgor, no rashes HEENT: Normocephalic, atraumatic. Pupils round and reactive; sclera anicteric; Fundi mild arteriolar narrowing Nose without nasal septal hypertrophy Mouth/Parynx benign; Mallinpatti scale 4 Neck: Thick neck No JVD, no carotid briuts; normal carotid upstroke Lungs: clear to ausculatation and percussion; no wheezing or rales Chest wall: Nontender to palpation Heart: RRR, s1 s2 normal 1/6 systolic murmur. No S3 gallop.  No diastolic murmur, no rubs, thrills, or she Abdomen: Mildly obese; soft, nontender; no hepatosplenomehaly, BS+; abdominal aorta nontender and not dilated by palpation. Back: No CVA tenderness Pulses 2+ Extremities: Trivial  ankle edema, no clubbing cyanosis, Homan's sign negative  Neurologic: grossly nonfocal Psychological: Normal affect and mood.  ECG (independently read by me): Normal sinus rhythm at 64 bpm.  LVH by voltage criteria.  Nonspecific T changes.  October 2016 ECG (independently read by me): Sinus rhythm at 80 bpm.  Borderline LVH by voltage in aVL.  Nonspecific T changes.  November 2015 ECG (independently read by me): Normal sinus rhythm at 73 bpm.  No ectopy.  Prior ECG (independently read by me): sinus rhythm at 84 beats per minute.  Nonspecific T changes.  QTc interval 411 milliseconds.   LABS: BMP Latest Ref Rng & Units 01/23/2016 08/22/2015 08/22/2015  Glucose 65 - 99 mg/dL 275(H) 241(H) 208(H)  BUN 6 - 20 mg/dL 23(H) 14 13  Creatinine 0.44 - 1.00 mg/dL 1.13(H) 1.24(H) 1.25(H)  BUN/Creat Ratio 9 - 23 - 11 -  Sodium 135 - 145 mmol/L 139 141 140  Potassium 3.5 - 5.1 mmol/L 4.5 4.6 4.2  Chloride 101 - 111 mmol/L 106 102 109  CO2 22 - 32 mmol/L _1 Calcium 8.9 - 10.3 mg/dL 10.1 10.4(H) 10.2   Hepatic Function Latest Ref Rng & Units 01/23/2016 08/22/2015 01/10/2015  Total Protein 6.5 - 8.1 g/dL 7.8 - 7.4  Albumin 3.5 - 5.0 g/dL 4.0 4.1 4.3  AST 15 - 41 U/L  42(H) - 60(H)  ALT 14 - 54 U/L 28 - 40(H)  Alk Phosphatase 38 - 126 U/L 104 - 108  Total Bilirubin 0.3 -  1.2 mg/dL 0.2(L) - <0.2   CBC Latest Ref Rng & Units 01/23/2016 08/22/2015 07/30/2015  WBC 3.6 - 11.0 K/uL 7.8 5.9 6.4  Hemoglobin 12.0 - 16.0 g/dL 12.0 10.6(L) 11.5(L)  Hematocrit 35.0 - 47.0 % 36.1 34.4(L) 35.0(L)  Platelets 150 - 440 K/uL 229 252 269   Lab Results  Component Value Date   MCV 83.9 01/23/2016   MCV 86.4 08/22/2015   MCV 86.4 07/30/2015   Lab Results  Component Value Date   TSH 2.522 05/19/2015   Lab Results  Component Value Date   HGBA1C 8.6 02/02/2016   Lipid Panel     Component Value Date/Time   CHOL 289 (H) 01/10/2015 1009   CHOL 277 (H) 08/16/2014 0843   TRIG 130 01/10/2015 1009   TRIG 167 (H) 08/16/2014 0843   HDL 91 01/10/2015 1009   HDL 77 08/16/2014 0843   CHOLHDL 3.2 01/10/2015 1009   CHOLHDL 4.0 01/05/2013 0943   VLDL 33 08/16/2014 0843   LDLCALC 172 (H) 01/10/2015 1009   LDLCALC 167 (H) 08/16/2014 0843    RADIOLOGY: No results found.  IMPRESSION:  1. Essential hypertension   2. Obstructive sleep apnea   3. Diabetes mellitus type 2 in obese ([place])   4. Mixed hyperlipidemia   5. Drug therapy   6. Obesity (BMI 30-39.9)     ASSESSMENT AND PLAN: Ms. Loss is a 56 year old African-American female who has a history of hypertension, type 2 diabetes mellitus, peripheral edema, hyperlipidemia, migraine headaches, as well as obstructive sleep apnea.  Her initial sleep study was done in 2007.   In the past she had significant daytime sleepiness and significantly benefited from CPAP therapy.  However, she has not used this in several years.  She wakes up several times per night, but states the reason she wakes up is to take her dogs out to go to the bathroom.  She continues to note residual daytime sleepiness.  Her blood pressure today was slightly increased.  She has been on furosemide 20 mg but has been taking this on a when necessary basis.   I reviewed her cardiac catheterization study from April 2017, which was done after a nuclear study suggested ischemia.  An abnormality.  She continues to be on Vytorin 10/44, hyperlipidemia.  I have recommended laboratory be obtained.  In 2016.  Her lipid studies were markedly elevated.  If she continues to be elevated, she will need to be switched to high potency statin therapy.  I'm increasing metoprolol to 75 mg twice a day.  I've also discussed the importance of resuming CPAP therapy.  Since it is been over 11 years since her sleep study  I will schedule her for follow-up split-night evaluation.  I will see her in 4 months for reevaluation.  Time spent: 25 minutes  Troy Sine, MD, [facility]  06/02/2016 7:54 PM

## 2016-06-04 ENCOUNTER — Telehealth: Payer: Self-pay | Admitting: Family Medicine

## 2016-06-04 NOTE — Telephone Encounter (Signed)
Put in referral to get a new glucometer.

## 2016-06-04 NOTE — Telephone Encounter (Signed)
Please review below-aa

## 2016-06-04 NOTE — Telephone Encounter (Signed)
Please see below thank you-aa

## 2016-06-04 NOTE — Telephone Encounter (Signed)
Spoke to pt and she said her glucose meter was broken. She believes it was this one Abigail Figueroa Does she need a prescription for a new one?  I also have a resource through [employer] that offers discounted or free ones for patients that I could put her in touch with your approval or put in a referral and I'll take care of it!  Thank you, Curt Bears

## 2016-06-07 ENCOUNTER — Ambulatory Visit (INDEPENDENT_AMBULATORY_CARE_PROVIDER_SITE_OTHER): Payer: Medicare Other | Admitting: Family Medicine

## 2016-06-07 ENCOUNTER — Encounter: Payer: Self-pay | Admitting: Family Medicine

## 2016-06-07 VITALS — BP 152/76 | HR 88 | Temp 99.9°F | Resp 20 | Wt 215.0 lb

## 2016-06-07 DIAGNOSIS — R509 Fever, unspecified: Secondary | ICD-10-CM | POA: Diagnosis not present

## 2016-06-07 DIAGNOSIS — R059 Cough, unspecified: Secondary | ICD-10-CM

## 2016-06-07 DIAGNOSIS — R05 Cough: Secondary | ICD-10-CM

## 2016-06-07 LAB — POCT INFLUENZA A/B
INFLUENZA A, POC: NEGATIVE
Influenza B, POC: NEGATIVE

## 2016-06-07 MED ORDER — OSELTAMIVIR PHOSPHATE 75 MG PO CAPS: 75.0000 mg | ORAL_CAPSULE | Freq: Two times a day (BID) | ORAL | 0 refills | Status: DC

## 2016-06-07 MED ORDER — AZITHROMYCIN 250 MG PO TABS: ORAL_TABLET | ORAL | 0 refills | Status: DC

## 2016-06-07 NOTE — Progress Notes (Signed)
Patient: Abigail Figueroa Female    DOB: [birth year]   56 y.o.   MRN: [id number] Visit Date: 06/07/2016  Today's Provider: Vernie Murders, PA   Chief Complaint  Patient presents with  . URI    Started Saturday  . Fever   Subjective:    URI   This is a new problem. The current episode started in the past 7 days. The problem has been gradually worsening. The maximum temperature recorded prior to her arrival was 100.4 - 100.9 F. Associated symptoms include abdominal pain, congestion, coughing, ear pain, headaches, rhinorrhea, sinus pain, sneezing, a sore throat and wheezing. Pertinent negatives include no chest pain, diarrhea, joint swelling, nausea, plugged ear sensation, rash or vomiting. She has tried acetaminophen and NSAIDs for the symptoms. The treatment provided no relief.  Fever   This is a new problem. The current episode started in the past 7 days. The problem has been gradually worsening. The maximum temperature noted was 101 to 101.9 F (101.4 last night). Associated symptoms include abdominal pain, congestion, coughing, ear pain, headaches, a sore throat and wheezing. Pertinent negatives include no chest pain, diarrhea, nausea, rash or vomiting. She has tried acetaminophen and NSAIDs for the symptoms. The treatment provided no relief.   Patient Active Problem List   Diagnosis Date Noted  . Right-sided Bell's palsy 02/02/2016  . Spondylolisthesis of lumbar region 08/25/2015  . Type II diabetes mellitus ([place])   . Abnormal stress test   . Hypercholesterolemia   . Abnormal nuclear stress test 07/30/2015  . Preoperative clearance 07/22/2015  . Lower extremity edema 07/22/2015  . History of brain disorder 11/05/2014  . H/O: obesity 11/05/2014  . Depression, major, recurrent, moderate ([place]) 11/05/2014  . Difficulty with family 11/05/2014  . Feeling stressed out 11/05/2014  . H/O: HTN (hypertension) 11/05/2014  . H/O elevated lipids 11/05/2014  . Anxiety, generalized  11/05/2014  . Migraine headache 11/05/2014  . Moderate recurrent major depression ([place]) 10/16/2014  . Generalized anxiety disorder 10/16/2014  . CHF (congestive heart failure) ([place]) 10/14/2014  . Edema 10/14/2014  . Depressive disorder 10/14/2014  . Obstructive sleep apnea 10/14/2014  . Diabetes ([place]) 10/14/2014  . Essential hypertension 10/14/2014  . Epilepsy ([place]) 10/14/2014  . Migraine 10/14/2014  . Hyperlipemia 10/14/2014  . Dysthymic disorder 10/14/2014  . Seizure ([place]) 10/14/2014  . Hypercholesteremia 10/14/2014  . Palpitation 10/14/2014  . Depression 10/14/2014  . Dyspepsia 10/14/2014  . Hematochezia 10/14/2014  . Urge incontinence 10/14/2014  . Hair thinning 10/14/2014  . Dermatitis, eczematoid 09/10/2014  . Acid indigestion 09/10/2014  . Alopecia 09/10/2014  . Blood in feces 09/10/2014  . H/O hypercholesterolemia 09/10/2014  . Arthralgia of hip 09/10/2014  . Feeling bilious 09/10/2014  . Awareness of heartbeats 09/10/2014  . Pain in the wrist 09/10/2014  . Convulsions ([place]) 09/10/2014  . Urge incontinence 09/10/2014  . Snapping thumb syndrome 04/19/2014  . Diabetes mellitus ([place]) 03/14/2014  . De Quervain's disease (radial styloid tenosynovitis) 03/08/2014  . LBP (low back pain) 03/07/2013  . Mixed hyperlipidemia 01/15/2013  . Encounter for long-term (current) use of other medications - statin 01/15/2013  . Fatigue 01/15/2013  . Heart palpation 01/15/2013  . Obesity (BMI 30-39.9) 01/02/2013  . CCF (congestive cardiac failure) ([place]) 11/13/2009  . Malaise and fatigue 02/17/2009  . Sprain and strain of interphalangeal (joint) of hand 10/23/2007  . HEADACHE 06/23/2007  . Diabetes mellitus type 2 in obese ([place]) 05/08/2007  . HYPERCALCEMIA 05/08/2007  . Sleep apnea  05/08/2007  . Edema 05/08/2007  . DIARRHEA, CHRONIC 05/08/2007  . Clinical depression 10/20/2006  . Obstructive apnea 03/22/2006  . Diabetes mellitus, type 2 ([place]) 11/12/2005  . Depression, neurotic  11/12/2005  . Epilepsy ([place]) 11/12/2005  . Combined fat and carbohydrate induced hyperlipemia 11/12/2005  . Acute onset aura migraine 11/12/2005   Past Surgical History:  Procedure Laterality Date  . ABDOMINAL HYSTERECTOMY  1990   without BSO  . ABDOMINAL HYSTERECTOMY  1990  . BACK SURGERY  1900s 2001   x2 with "cage put in"  . BACK SURGERY  2001  . BILATERAL SALPINGOOPHORECTOMY  1990  . CARDIAC CATHETERIZATION N/A 08/06/2015   Procedure: Left Heart Cath and Coronary Angiography;  Surgeon: Troy Sine, MD;  Location: [facility];  Service: Cardiovascular;  Laterality: N/A;  . CARPAL TUNNEL RELEASE Bilateral   . CHOLECYSTECTOMY  1980s  . CHOLECYSTECTOMY  1980  . DORSAL COMPARTMENT RELEASE Left 10/25/2014   Procedure: LEFT FIRST  DORSAL COMPARTMENT RELEASE AND RADIAL TENOSYNOVECTOMY ;  Surgeon: Roseanne Kaufman, MD;  Location: [facility];  Service: Orthopedics;  Laterality: Left;  . HAND SURGERY    . KNEE SURGERY Right 2012   meniscus tear  . KNEE SURGERY  2012  . LAMINECTOMY  1995  . TUBAL LIGATION  1982  . WRIST SURGERY Right    Family History  Problem Relation Age of Onset  . Sarcoidosis Mother   . Seizures Mother   . Heart attack Mother   . Alzheimer's disease Maternal Grandmother   . Dementia Maternal Grandmother   . Hypertension Maternal Grandfather   . Diabetes Maternal Grandfather       Allergies  Allergen Reactions  . Morphine And Related Hives    Nausea and vomiting   . Oxycontin [Oxycodone Hcl] Swelling, Hives and Itching    Current Outpatient Prescriptions:  .  baclofen (LIORESAL) 10 MG tablet, Take 1 tablet (10 mg total) by mouth 3 (three) times daily. (Patient taking differently: Take 10 mg by mouth 3 (three) times daily as needed (Headaches). ), Disp: 30 each, Rfl: 0 .  BOTOX 100 UNITS SOLR injection, Inject into the muscle every 3 (three) months. , Disp: , Rfl:  .  CYCLOBENZAPRINE HCL PO, Take 1 tablet by mouth daily as needed.,  Disp: , Rfl:  .  ezetimibe-simvastatin (VYTORIN) 10-40 MG tablet, TAKE 1 TABLET BY MOUTH AT BEDTIME. PLEASE SCHEDULE APPOINTMENT FOR REFILLS., Disp: 30 tablet, Rfl: 2 .  fluocinonide ointment (LIDEX) 3.15 %, APPLY 1 APPLICATION TOPICALLY 2 (TWO) TIMES DAILY., Disp: 30 g, Rfl: 1 .  furosemide (LASIX) 20 MG tablet, Take 1 tablet (20 mg total) by mouth daily as needed for fluid or edema. (Patient taking differently: Take 20 mg by mouth daily. ), Disp: 30 tablet, Rfl: 5 .  gabapentin (NEURONTIN) 300 MG capsule, Take 300 mg by mouth 3 (three) times daily., Disp: , Rfl:  .  glucose blood test strip, Use as instructed, Disp: 100 each, Rfl: 12 .  lamoTRIgine (LAMICTAL) 100 MG tablet, Take 100 mg by mouth 2 (two) times daily., Disp: , Rfl:  .  Lancets (ONETOUCH ULTRASOFT) lancets, Use as instructed, Disp: 100 each, Rfl: 12 .  metFORMIN (GLUCOPHAGE) 500 MG tablet, TAKE 2 TABLETS IN THE MORNING, 1 TABLET AT LUNCH TIME AND 2 TABLETS IN THE EVENING, Disp: 150 tablet, Rfl: 3 .  metoprolol (LOPRESSOR) 50 MG tablet, Take 1.5 tablets (75 mg total) by mouth 2 (two) times daily., Disp: 90 tablet, Rfl:  6 .  potassium chloride (MICRO-K) 10 MEQ CR capsule, Take 1 capsule (10 mEq total) by mouth daily as needed. (Patient taking differently: Take 10 mEq by mouth daily. ), Disp: 30 capsule, Rfl: 5 .  TEGRETOL-XR 400 MG 12 hr tablet, TAKE 1 TABLET BY MOUTH TWICE DAILY, Disp: 60 tablet, Rfl: 4 .  triamcinolone cream (KENALOG) 0.1 %, Apply 1 application topically 2 (two) times daily., Disp: , Rfl:  .  Vilazodone HCl (VIIBRYD) 40 MG TABS, Take 1 tablet (40 mg total) by mouth daily., Disp: 30 tablet, Rfl: 4  Review of Systems  Constitutional: Positive for activity change, chills, diaphoresis, fatigue and fever. Negative for appetite change.  HENT: Positive for congestion, ear pain, rhinorrhea, sinus pain, sinus pressure, sneezing and sore throat. Negative for ear discharge, nosebleeds, postnasal drip and tinnitus.   Eyes:  Negative.   Respiratory: Positive for cough, chest tightness and wheezing. Negative for apnea, choking, shortness of breath and stridor.   Cardiovascular: Negative.  Negative for chest pain.  Gastrointestinal: Positive for abdominal pain. Negative for abdominal distention, anal bleeding, blood in stool, constipation, diarrhea, nausea, rectal pain and vomiting.  Skin: Negative for rash.  Neurological: Positive for headaches. Negative for dizziness and light-headedness.    Social History  Substance Use Topics  . Smoking status: Former Smoker    Types: Cigarettes    Quit date: 01/03/1979  . Smokeless tobacco: Never Used     Comment: quit in 1984  . Alcohol use No   Objective:   BP (!) 152/76 (BP Location: Right Arm, Patient Position: Sitting, Cuff Size: Large)   Pulse 88   Temp 99.9 F (37.7 C) (Oral)   Resp 20   Wt 215 lb (97.5 kg)   LMP  (LMP Unknown)   SpO2 95%   BMI 38.09 kg/m   Physical Exam  Constitutional: She is oriented to person, place, and time. She appears well-developed and well-nourished.  HENT:  Head: Normocephalic.  Right Ear: External ear normal.  Left Ear: External ear normal.  Nose: Nose normal.  Mouth/Throat: Oropharynx is clear and moist.  Eyes: Conjunctivae are normal.  Neck: Neck supple.  Cardiovascular: Normal rate.   Pulmonary/Chest: Effort normal and breath sounds normal.  Abdominal: Soft. Bowel sounds are normal.  Lymphadenopathy:    She has no cervical adenopathy.  Neurological: She is alert and oriented to person, place, and time.      Assessment & Plan:    1. Fever, unspecified fever cause Onset over the past 7 days with cough, congestion, abdominal discomfort, sore throat and malaise. Had temperature spike to 101.4 at home last night. Flu test negative for A&B. Appears sicker than physical exam. Will treat with Tamiflu and given Z-pak for possible subacute bronchitis. Check CBC and continue OTC cough and cold medications with Tylenol prn. -  POCT Influenza A/B - CBC with Differential/Platelet - oseltamivir (TAMIFLU) 75 MG capsule; Take 1 capsule (75 mg total) by mouth 2 (two) times daily.  Dispense: 10 capsule; Refill: 0 - azithromycin (ZITHROMAX) 250 MG tablet; Take 2 tablets by mouth today then one daily for 4 days.  Dispense: 6 tablet; Refill: 0  2. Cough Onset over the past week. Flu test negative. No significant sputum production but having fever. Treat with Tamiflu and Z-pak. Allergic to morphine/opiates (hives). Recommend she use Mucinex-DM for cough, increase fluid intake and take Tylenol or Advil prn fever. Recheck pending lab reports. - POCT Influenza A/B - oseltamivir (TAMIFLU) 75 MG capsule; Take 1 capsule (  75 mg total) by mouth 2 (two) times daily.  Dispense: 10 capsule; Refill: 0 - azithromycin (ZITHROMAX) 250 MG tablet; Take 2 tablets by mouth today then one daily for 4 days.  Dispense: 6 tablet; Refill: [place], [place]  [facility]

## 2016-06-07 NOTE — Patient Instructions (Signed)

## 2016-06-08 ENCOUNTER — Telehealth: Payer: Self-pay

## 2016-06-08 LAB — CBC WITH DIFFERENTIAL/PLATELET
BASOS: 0 %
Basophils Absolute: 0 10*3/uL (ref 0.0–0.2)
EOS (ABSOLUTE): 0.1 10*3/uL (ref 0.0–0.4)
EOS: 1 %
HEMATOCRIT: 34.6 % (ref 34.0–46.6)
HEMOGLOBIN: 11.3 g/dL (ref 11.1–15.9)
IMMATURE GRANS (ABS): 0 10*3/uL (ref 0.0–0.1)
IMMATURE GRANULOCYTES: 0 %
Lymphocytes Absolute: 1.4 10*3/uL (ref 0.7–3.1)
Lymphs: 24 %
MCH: 27.2 pg (ref 26.6–33.0)
MCHC: 32.7 g/dL (ref 31.5–35.7)
MCV: 83 fL (ref 79–97)
MONOCYTES: 12 %
Monocytes Absolute: 0.7 10*3/uL (ref 0.1–0.9)
NEUTROS PCT: 63 %
Neutrophils Absolute: 3.8 10*3/uL (ref 1.4–7.0)
Platelets: 205 10*3/uL (ref 150–379)
RBC: 4.15 x10E6/uL (ref 3.77–5.28)
RDW: 15.9 % — ABNORMAL HIGH (ref 12.3–15.4)
WBC: 6 10*3/uL (ref 3.4–10.8)

## 2016-06-08 NOTE — Telephone Encounter (Signed)
Pt advised. Abigail Figueroa, CMA  

## 2016-06-08 NOTE — Telephone Encounter (Signed)
CRR in EPIC. Curt Bears advised.

## 2016-06-08 NOTE — Telephone Encounter (Signed)
lmtcb Emily Drozdowski, CMA  

## 2016-06-08 NOTE — Telephone Encounter (Signed)
-----   Message from Margo Common, [place] sent at 06/08/2016  7:59 AM EST ----- No sign of bacterial infection on CBC. Probably correct in assuming this is a viral illness. Proceed with present medications and home to rest. Recheck 5-7 days if no better.

## 2016-06-08 NOTE — Telephone Encounter (Signed)
Ana, sending you instructions on how to do the [facility] Referral. Thanks, knb

## 2016-06-22 ENCOUNTER — Encounter: Payer: Self-pay | Admitting: Family Medicine

## 2016-06-25 ENCOUNTER — Other Ambulatory Visit: Payer: Self-pay | Admitting: Family Medicine

## 2016-06-25 ENCOUNTER — Telehealth: Payer: Self-pay | Admitting: Emergency Medicine

## 2016-06-25 DIAGNOSIS — E669 Obesity, unspecified: Principal | ICD-10-CM

## 2016-06-25 DIAGNOSIS — E119 Type 2 diabetes mellitus without complications: Secondary | ICD-10-CM

## 2016-06-25 DIAGNOSIS — E1169 Type 2 diabetes mellitus with other specified complication: Secondary | ICD-10-CM

## 2016-06-25 MED ORDER — ONETOUCH ULTRASOFT LANCETS MISC: 12 refills | Status: DC

## 2016-06-25 MED ORDER — GLUCOSE BLOOD VI STRP: ORAL_STRIP | 12 refills | Status: DC

## 2016-06-25 NOTE — Telephone Encounter (Signed)
Have a written prescription ready and a free new OneTouch Verio glucometer in our sample closet. May pick these up and have sent test strips with lancets to the pharmacy.

## 2016-06-25 NOTE — Telephone Encounter (Signed)
Pt would like to know if she can get a new meter. She has the one touch ultra 2 now. Pt will also need the strips and lancet. She also requesting rx for Disposable underwear be sent to [facility] [phone]). She has gotten these in the past from there for mixed incontinence. (order in media section from 05/05/14). Please advise.    For the meter, test strips and lancets she uses [facility] [address]

## 2016-06-28 NOTE — Telephone Encounter (Signed)
Written RX and glucometer at front desk for pick up. Patient's husband Nicole Kindred advised.

## 2016-07-01 ENCOUNTER — Ambulatory Visit: Payer: 59 | Admitting: Psychiatry

## 2016-07-02 ENCOUNTER — Other Ambulatory Visit: Payer: Self-pay | Admitting: Family Medicine

## 2016-07-09 DIAGNOSIS — G43719 Chronic migraine without aura, intractable, without status migrainosus: Secondary | ICD-10-CM | POA: Diagnosis not present

## 2016-07-09 DIAGNOSIS — M791 Myalgia: Secondary | ICD-10-CM | POA: Diagnosis not present

## 2016-07-09 DIAGNOSIS — R51 Headache: Secondary | ICD-10-CM | POA: Diagnosis not present

## 2016-07-09 DIAGNOSIS — M542 Cervicalgia: Secondary | ICD-10-CM | POA: Diagnosis not present

## 2016-07-13 ENCOUNTER — Encounter: Payer: Self-pay | Admitting: Family Medicine

## 2016-07-13 ENCOUNTER — Ambulatory Visit: Payer: Self-pay

## 2016-07-15 ENCOUNTER — Ambulatory Visit: Payer: Self-pay

## 2016-07-15 ENCOUNTER — Encounter: Payer: Self-pay | Admitting: Family Medicine

## 2016-07-19 ENCOUNTER — Encounter (HOSPITAL_BASED_OUTPATIENT_CLINIC_OR_DEPARTMENT_OTHER): Payer: Medicare Other

## 2016-07-20 ENCOUNTER — Encounter: Payer: Self-pay | Admitting: Family Medicine

## 2016-07-20 ENCOUNTER — Other Ambulatory Visit: Payer: Self-pay

## 2016-07-20 ENCOUNTER — Ambulatory Visit (INDEPENDENT_AMBULATORY_CARE_PROVIDER_SITE_OTHER): Payer: Medicare Other

## 2016-07-20 ENCOUNTER — Ambulatory Visit (INDEPENDENT_AMBULATORY_CARE_PROVIDER_SITE_OTHER): Payer: Medicare Other | Admitting: Family Medicine

## 2016-07-20 VITALS — BP 148/82 | HR 64 | Temp 99.0°F | Wt 217.2 lb

## 2016-07-20 VITALS — BP 148/82 | HR 64 | Temp 99.0°F | Ht 63.0 in | Wt 217.2 lb

## 2016-07-20 DIAGNOSIS — Z0000 Encounter for general adult medical examination without abnormal findings: Secondary | ICD-10-CM

## 2016-07-20 DIAGNOSIS — Z1159 Encounter for screening for other viral diseases: Secondary | ICD-10-CM

## 2016-07-20 DIAGNOSIS — J301 Allergic rhinitis due to pollen: Secondary | ICD-10-CM | POA: Diagnosis not present

## 2016-07-20 DIAGNOSIS — F331 Major depressive disorder, recurrent, moderate: Secondary | ICD-10-CM

## 2016-07-20 DIAGNOSIS — Z114 Encounter for screening for human immunodeficiency virus [HIV]: Secondary | ICD-10-CM | POA: Diagnosis not present

## 2016-07-20 DIAGNOSIS — G40909 Epilepsy, unspecified, not intractable, without status epilepticus: Secondary | ICD-10-CM

## 2016-07-20 DIAGNOSIS — Z1239 Encounter for other screening for malignant neoplasm of breast: Secondary | ICD-10-CM

## 2016-07-20 DIAGNOSIS — I509 Heart failure, unspecified: Secondary | ICD-10-CM

## 2016-07-20 DIAGNOSIS — E1100 Type 2 diabetes mellitus with hyperosmolarity without nonketotic hyperglycemic-hyperosmolar coma (NKHHC): Secondary | ICD-10-CM

## 2016-07-20 DIAGNOSIS — I10 Essential (primary) hypertension: Secondary | ICD-10-CM | POA: Diagnosis not present

## 2016-07-20 DIAGNOSIS — E7800 Pure hypercholesterolemia, unspecified: Secondary | ICD-10-CM

## 2016-07-20 DIAGNOSIS — G4730 Sleep apnea, unspecified: Secondary | ICD-10-CM

## 2016-07-20 DIAGNOSIS — Z1231 Encounter for screening mammogram for malignant neoplasm of breast: Secondary | ICD-10-CM | POA: Diagnosis not present

## 2016-07-20 LAB — POCT UA - MICROALBUMIN: MICROALBUMIN (UR) POC: 50 mg/L

## 2016-07-20 MED ORDER — FLUTICASONE PROPIONATE 50 MCG/ACT NA SUSP: 2.0000 | Freq: Every day | NASAL | 6 refills | Status: DC

## 2016-07-20 NOTE — Progress Notes (Signed)
Subjective:   Abigail Figueroa is a 56 y.o. female who presents for Medicare Annual (Subsequent) preventive examination.  Review of Systems:  N/A  Cardiac Risk Factors include: hypertension;dyslipidemia;advanced age (>40men, >66 women);diabetes mellitus;obesity (BMI >30kg/m2)     Objective:     Vitals: BP (!) 148/82 (BP Location: Right Arm)   Pulse 64   Temp 99 F (37.2 C) (Oral)   Ht 5\' 3"  (1.6 m)   Wt 217 lb 3.2 oz (98.5 kg)   LMP  (LMP Unknown)   BMI 38.48 kg/m   Body mass index is 38.48 kg/m.   Tobacco History  Smoking Status  . Former Smoker  . Types: Cigarettes  . Quit date: 01/03/1979  Smokeless Tobacco  . Never Used    Comment: quit in 1984     Counseling given: Not Answered   Past Medical History:  Diagnosis Date  . Abnormal stress test    a. 10/2009: Normal myocardial perfusion imaging; b. 08/2015 MV: medium defect of mod severity in mid ant apical region w/ mild HK of the distal inf wall;  c. 08/2015 Cath: nl Cors, EF 60%.  . Anemia   . Anxiety   . Arthritis   . Bell's palsy   . Depression   . Edema   . Essential hypertension   . Frequent urination   . Frequent urination at night   . Headache    migraines, gets botox injections  . Heart palpitations June 2013   Event monitor showing sinus tachycardia, PACs with couplets and triplets.  Marland Kitchen Hx of echocardiogram    a. 10/2009 Echo: showed normal left ventricular function, mild left ventricular hypertrophy, and no significant valve abnormalities.  . Hypercholesterolemia   . Migraine   . Morbid obesity ([place])   . Neuromuscular disorder ([place])   . OSA (obstructive sleep apnea)    has been on continuous positive airway pressure  . Seizure disorder ([place])   . Seizures ([place])   . Type II diabetes mellitus ([place])    Past Surgical History:  Procedure Laterality Date  . ABDOMINAL HYSTERECTOMY  1990   without BSO  . ABDOMINAL HYSTERECTOMY  1990  . BACK SURGERY  1900s 2001   x2 with "cage put in"  . BACK  SURGERY  2001  . BILATERAL SALPINGOOPHORECTOMY  1990  . CARDIAC CATHETERIZATION N/A 08/06/2015   Procedure: Left Heart Cath and Coronary Angiography;  Surgeon: Troy Sine, MD;  Location: [facility];  Service: Cardiovascular;  Laterality: N/A;  . CARPAL TUNNEL RELEASE Bilateral   . CHOLECYSTECTOMY  1980s  . CHOLECYSTECTOMY  1980  . DORSAL COMPARTMENT RELEASE Left 10/25/2014   Procedure: LEFT FIRST  DORSAL COMPARTMENT RELEASE AND RADIAL TENOSYNOVECTOMY ;  Surgeon: Roseanne Kaufman, MD;  Location: [facility];  Service: Orthopedics;  Laterality: Left;  . HAND SURGERY    . KNEE SURGERY Right 2012   meniscus tear  . KNEE SURGERY  2012  . LAMINECTOMY  1995  . TUBAL LIGATION  1982  . WRIST SURGERY Right    Family History  Problem Relation Age of Onset  . Sarcoidosis Mother   . Seizures Mother   . Heart attack Mother   . Alzheimer's disease Maternal Grandmother   . Dementia Maternal Grandmother   . Hypertension Maternal Grandfather   . Diabetes Maternal Grandfather    History  Sexual Activity  . Sexual activity: No    Outpatient Encounter Prescriptions as of 07/20/2016  Medication Sig  .  baclofen (LIORESAL) 10 MG tablet Take 1 tablet (10 mg total) by mouth 3 (three) times daily. (Patient taking differently: Take 10 mg by mouth 2 (two) times daily as needed (Headaches). )  . BOTOX 100 UNITS SOLR injection Inject into the muscle every 3 (three) months.   . CYCLOBENZAPRINE HCL PO Take 1 tablet by mouth daily as needed.  . ezetimibe-simvastatin (VYTORIN) 10-40 MG tablet TAKE 1 TABLET BY MOUTH AT BEDTIME. PLEASE SCHEDULE APPOINTMENT FOR REFILLS.  . fluocinonide ointment (LIDEX) 4.01 % APPLY 1 APPLICATION TOPICALLY 2 (TWO) TIMES DAILY.  . furosemide (LASIX) 20 MG tablet Take 1 tablet (20 mg total) by mouth daily as needed for fluid or edema. (Patient taking differently: Take 20 mg by mouth daily. )  . gabapentin (NEURONTIN) 300 MG capsule Take 300 mg by mouth 3 (three)  times daily.   Marland Kitchen glucose blood test strip Use as instructed  . lamoTRIgine (LAMICTAL) 100 MG tablet Take 100 mg by mouth 2 (two) times daily.  . Lancets (ONETOUCH ULTRASOFT) lancets Use as instructed  . metFORMIN (GLUCOPHAGE) 500 MG tablet TAKE 2 TABLETS IN THE MORNING, 1 TABLET AT LUNCH TIME AND 2 TABLETS IN THE EVENING  . metoprolol (LOPRESSOR) 50 MG tablet Take 1.5 tablets (75 mg total) by mouth 2 (two) times daily. (Patient taking differently: Take 50 mg by mouth 2 (two) times daily. )  . potassium chloride (MICRO-K) 10 MEQ CR capsule Take 1 capsule (10 mEq total) by mouth daily as needed. (Patient taking differently: Take 10 mEq by mouth daily. )  . TEGRETOL-XR 400 MG 12 hr tablet TAKE 1 TABLET BY MOUTH TWICE DAILY  . triamcinolone cream (KENALOG) 0.1 % Apply 1 application topically 2 (two) times daily.  . Vilazodone HCl (VIIBRYD) 40 MG TABS Take 1 tablet (40 mg total) by mouth daily.  Marland Kitchen azithromycin (ZITHROMAX) 250 MG tablet Take 2 tablets by mouth today then one daily for 4 days. (Patient not taking: Reported on 07/20/2016)  . oseltamivir (TAMIFLU) 75 MG capsule Take 1 capsule (75 mg total) by mouth 2 (two) times daily. (Patient not taking: Reported on 07/20/2016)   No facility-administered encounter medications on file as of 07/20/2016.     Activities of Daily Living In your present state of health, do you have any difficulty performing the following activities: 07/20/2016 08/22/2015  Hearing? Y N  Vision? N N  Difficulty concentrating or making decisions? N N  Walking or climbing stairs? Y Y  Dressing or bathing? N N  Doing errands, shopping? N N  Preparing Food and eating ? N -  Using the Toilet? N -  In the past six months, have you accidently leaked urine? Y -  Do you have problems with loss of bowel control? N -  Managing your Medications? N -  Managing your Finances? N -  Housekeeping or managing your Housekeeping? N -  Some recent data might be hidden    Patient Care  Team: Margo Common, PA as PCP - General (Physician Assistant) Troy Sine, MD as Consulting Physician (Cardiology) Orie Rout, MD as Referring Physician (Specialist) Newman Pies, MD as Consulting Physician (Neurosurgery) Roseanne Kaufman, MD as Consulting Physician (Orthopedic Surgery)    Assessment:    Exercise Activities and Dietary recommendations Current Exercise Habits: Home exercise routine, Type of exercise: walking, Time (Minutes): 30, Frequency (Times/Week): 7, Weekly Exercise (Minutes/Week): 210, Intensity: Mild  Goals    . Increase water intake          Recommend  to continue drinking 40 oz of water a day.      Fall Risk Fall Risk  07/20/2016  Falls in the past year? No  Risk for fall due to : Impaired mobility   Depression Screen PHQ 2/9 Scores 07/20/2016  PHQ - 2 Score 2  PHQ- 9 Score 7     Cognitive Function     6CIT Screen 07/20/2016  What Year? 0 points  What month? 0 points  What time? 0 points  Count back from 20 0 points  Months in reverse 0 points  Repeat phrase 10 points  Total Score 10    Immunization History  Administered Date(s) Administered  . Tdap 10/20/2006, 10/20/2006   Screening Tests Health Maintenance  Topic Date Due  . FOOT EXAM  10/14/1970  . URINE MICROALBUMIN  03/22/2015  . MAMMOGRAM  10/01/2016 (Originally 04/19/2014)  . INFLUENZA VACCINE  01/01/2017 (Originally 12/02/2015)  . PNEUMOCOCCAL POLYSACCHARIDE VACCINE (1) 10/13/2025 (Originally 10/14/1962)  . PAP SMEAR  05/03/2026 (Originally 10/13/1981)  . HEMOGLOBIN A1C  08/02/2016  . TETANUS/TDAP  10/19/2016  . COLONOSCOPY  11/22/2016  . OPHTHALMOLOGY EXAM  02/11/2017  . Hepatitis C Screening  Completed  . HIV Screening  Completed      Plan:  I have personally reviewed and addressed the Medicare Annual Wellness questionnaire and have noted the following in the patient's chart:  A. Medical and social history B. Use of alcohol, tobacco or illicit drugs   C. Current medications and supplements D. Functional ability and status E.  Nutritional status F.  Physical activity G. Advance directives H. List of other physicians I.  Hospitalizations, surgeries, and ER visits in previous 12 months J.  [place] such as hearing and vision if needed, cognitive and depression L. Referrals and appointments - none  In addition, I have reviewed and discussed with patient certain preventive protocols, quality metrics, and best practice recommendations. A written personalized care plan for preventive services as well as general preventive health recommendations were provided to patient.  See attached scanned questionnaire for additional information.   Signed,  Fabio Neighbors, LPN Nurse Health Advisor   MD Recommendations: Pt needs diabetic foot exam and urine microalbumin today. Referral for  mammogram sent in today.    Reviewed the health advisor's note and was available for consultation. Agree with documentation and plan.

## 2016-07-20 NOTE — Patient Instructions (Signed)
Abigail Figueroa , Thank you for taking time to come for your Medicare Wellness Visit. I appreciate your ongoing commitment to your health goals. Please review the following plan we discussed and let me know if I can assist you in the future.   Screening recommendations/referrals: Colonoscopy: last 11/23/06 Mammogram: last 04/19/12 Recommended yearly ophthalmology/optometry visit for glaucoma screening and checkup Recommended yearly dental visit for hygiene and checkup  Vaccinations: Influenza vaccine: Denied Tdap vaccine: last 10/20/06  Advanced directives: Requested copy   Next appointment: None  Preventive Care 40-64 Years, Female Preventive care refers to lifestyle choices and visits with your health care provider that can promote health and wellness. What does preventive care include?  A yearly physical exam. This is also called an annual well check.  Dental exams once or twice a year.  Routine eye exams. Ask your health care provider how often you should have your eyes checked.  Personal lifestyle choices, including:  Daily care of your teeth and gums.  Regular physical activity.  Eating a healthy diet.  Avoiding tobacco and drug use.  Limiting alcohol use.  Practicing safe sex.  Taking low-dose aspirin daily starting at age 27.  Taking vitamin and mineral supplements as recommended by your health care provider. What happens during an annual well check? The services and screenings done by your health care provider during your annual well check will depend on your age, overall health, lifestyle risk factors, and family history of disease. Counseling  Your health care provider may ask you questions about your:  Alcohol use.  Tobacco use.  Drug use.  Emotional well-being.  Home and relationship well-being.  Sexual activity.  Eating habits.  Work and work Statistician.  Method of birth control.  Menstrual cycle.  Pregnancy history. Screening  You  may have the following tests or measurements:  Height, weight, and BMI.  Blood pressure.  Lipid and cholesterol levels. These may be checked every 5 years, or more frequently if you are over 10 years old.  Skin check.  Lung cancer screening. You may have this screening every year starting at age 69 if you have a 30-pack-year history of smoking and currently smoke or have quit within the past 15 years.  Fecal occult blood test (FOBT) of the stool. You may have this test every year starting at age 62.  Flexible sigmoidoscopy or colonoscopy. You may have a sigmoidoscopy every 5 years or a colonoscopy every 10 years starting at age 52.  Hepatitis C blood test.  Hepatitis B blood test.  Sexually transmitted disease (STD) testing.  Diabetes screening. This is done by checking your blood sugar (glucose) after you have not eaten for a while (fasting). You may have this done every 1-3 years.  Mammogram. This may be done every 1-2 years. Talk to your health care provider about when you should start having regular mammograms. This may depend on whether you have a family history of breast cancer.  BRCA-related cancer screening. This may be done if you have a family history of breast, ovarian, tubal, or peritoneal cancers.  Pelvic exam and Pap test. This may be done every 3 years starting at age 75. Starting at age 23, this may be done every 5 years if you have a Pap test in combination with an HPV test.  Bone density scan. This is done to screen for osteoporosis. You may have this scan if you are at high risk for osteoporosis. Discuss your test results, treatment options, and if necessary, the  need for more tests with your health care provider. Vaccines  Your health care provider may recommend certain vaccines, such as:  Influenza vaccine. This is recommended every year.  Tetanus, diphtheria, and acellular pertussis (Tdap, Td) vaccine. You may need a Td booster every 10 years.  Zoster  vaccine. You may need this after age 10.  Pneumococcal 13-valent conjugate (PCV13) vaccine. You may need this if you have certain conditions and were not previously vaccinated.  Pneumococcal polysaccharide (PPSV23) vaccine. You may need one or two doses if you smoke cigarettes or if you have certain conditions. Talk to your health care provider about which screenings and vaccines you need and how often you need them. This information is not intended to replace advice given to you by your health care provider. Make sure you discuss any questions you have with your health care provider. Document Released: 05/16/2015 Document Revised: 01/07/2016 Document Reviewed: 02/18/2015 Elsevier Interactive Patient Education  2017 [place] Prevention in the Home Falls can cause injuries. They can happen to people of all ages. There are many things you can do to make your home safe and to help prevent falls. What can I do on the outside of my home?  Regularly fix the edges of walkways and driveways and fix any cracks.  Remove anything that might make you trip as you walk through a door, such as a raised step or threshold.  Trim any bushes or trees on the path to your home.  Use bright outdoor lighting.  Clear any walking paths of anything that might make someone trip, such as rocks or tools.  Regularly check to see if handrails are loose or broken. Make sure that both sides of any steps have handrails.  Any raised decks and porches should have guardrails on the edges.  Have any leaves, snow, or ice cleared regularly.  Use sand or salt on walking paths during winter.  Clean up any spills in your garage right away. This includes oil or grease spills. What can I do in the bathroom?  Use night lights.  Install grab bars by the toilet and in the tub and shower. Do not use towel bars as grab bars.  Use non-skid mats or decals in the tub or shower.  If you need to sit down in the  shower, use a plastic, non-slip stool.  Keep the floor dry. Clean up any water that spills on the floor as soon as it happens.  Remove soap buildup in the tub or shower regularly.  Attach bath mats securely with double-sided non-slip rug tape.  Do not have throw rugs and other things on the floor that can make you trip. What can I do in the bedroom?  Use night lights.  Make sure that you have a light by your bed that is easy to reach.  Do not use any sheets or blankets that are too big for your bed. They should not hang down onto the floor.  Have a firm chair that has side arms. You can use this for support while you get dressed.  Do not have throw rugs and other things on the floor that can make you trip. What can I do in the kitchen?  Clean up any spills right away.  Avoid walking on wet floors.  Keep items that you use a lot in easy-to-reach places.  If you need to reach something above you, use a strong step stool that has a grab bar.  Keep electrical cords out of the way.  Do not use floor polish or wax that makes floors slippery. If you must use wax, use non-skid floor wax.  Do not have throw rugs and other things on the floor that can make you trip. What can I do with my stairs?  Do not leave any items on the stairs.  Make sure that there are handrails on both sides of the stairs and use them. Fix handrails that are broken or loose. Make sure that handrails are as long as the stairways.  Check any carpeting to make sure that it is firmly attached to the stairs. Fix any carpet that is loose or worn.  Avoid having throw rugs at the top or bottom of the stairs. If you do have throw rugs, attach them to the floor with carpet tape.  Make sure that you have a light switch at the top of the stairs and the bottom of the stairs. If you do not have them, ask someone to add them for you. What else can I do to help prevent falls?  Wear shoes that:  Do not have high  heels.  Have rubber bottoms.  Are comfortable and fit you well.  Are closed at the toe. Do not wear sandals.  If you use a stepladder:  Make sure that it is fully opened. Do not climb a closed stepladder.  Make sure that both sides of the stepladder are locked into place.  Ask someone to hold it for you, if possible.  Clearly mark and make sure that you can see:  Any grab bars or handrails.  First and last steps.  Where the edge of each step is.  Use tools that help you move around (mobility aids) if they are needed. These include:  Canes.  Walkers.  Scooters.  Crutches.  Turn on the lights when you go into a dark area. Replace any light bulbs as soon as they burn out.  Set up your furniture so you have a clear path. Avoid moving your furniture around.  If any of your floors are uneven, fix them.  If there are any pets around you, be aware of where they are.  Review your medicines with your doctor. Some medicines can make you feel dizzy. This can increase your chance of falling. Ask your doctor what other things that you can do to help prevent falls. This information is not intended to replace advice given to you by your health care provider. Make sure you discuss any questions you have with your health care provider. Document Released: 02/13/2009 Document Revised: 09/25/2015 Document Reviewed: 05/24/2014 Elsevier Interactive Patient Education  2017 [employer].

## 2016-07-20 NOTE — Progress Notes (Signed)
Patient: Abigail Figueroa Female    DOB: 1960/07/25   56 y.o.   MRN: [id number] Visit Date: 07/20/2016  Today's Provider: Vernie Murders, PA   Chief Complaint  Patient presents with  . Hypertension  . Hyperlipidemia  . Diabetes  . Follow-up   Subjective:    HPI Patient is here to follow up from AWE done today.   Diabetes Mellitus Type II, Follow-up:   Lab Results  Component Value Date   HGBA1C 8.6 02/02/2016   HGBA1C 9.2 (H) 07/21/2015   HGBA1C 9.3 (H) 01/10/2015   Last seen for diabetes 5 months ago.  Management since then includes encouraged to watch diet closer, walk for exercise and continue Metformin 500 mg 2 breakfast, 1 lunch and 2 supper. She reports good compliance with treatment. She is not having side effects.  Current symptoms include none  Home blood sugar records: fasting range: 130-140's  Episodes of hypoglycemia? no   Current Insulin Regimen: none Weight trend: stable Current diet: low carb Current exercise: yes  ------------------------------------------------------------------------   Hypertension, follow-up:  BP Readings from Last 3 Encounters:  07/20/16 (!) 148/82  07/20/16 (!) 148/82  06/07/16 (!) 152/76    She was last seen for hypertension 5 months ago.  BP at that visit was 158/92 Management since that visit includes continue medications.She reports good compliance with treatment. She is not having side effects.  She is exercising. She is adherent to low salt diet.   Outside blood pressures are not being checked. She is experiencing none.  Patient denies chest pain, chest pressure/discomfort, irregular heart beat and palpitations.   Cardiovascular risk factors include diabetes mellitus, dyslipidemia and obesity (BMI >= 30 kg/m2).  Use of agents associated with hypertension: none.   ------------------------------------------------------------------------    Lipid/Cholesterol, Follow-up:   Last seen for this 5 months ago.    Management since that visit includes continue medications.  Last Lipid Panel:    Component Value Date/Time   CHOL 289 (H) 01/10/2015 1009   CHOL 277 (H) 08/16/2014 0843   TRIG 130 01/10/2015 1009   TRIG 167 (H) 08/16/2014 0843   HDL 91 01/10/2015 1009   HDL 77 08/16/2014 0843   CHOLHDL 3.2 01/10/2015 1009   CHOLHDL 4.0 01/05/2013 0943   VLDL 33 08/16/2014 0843   LDLCALC 172 (H) 01/10/2015 1009   LDLCALC 167 (H) 08/16/2014 0843    She reports good compliance with treatment. She is not having side effects.   Wt Readings from Last 3 Encounters:  07/20/16 217 lb 3.2 oz (98.5 kg)  07/20/16 217 lb 3.2 oz (98.5 kg)  06/07/16 215 lb (97.5 kg)    ------------------------------------------------------------------------ Past Medical History:  Diagnosis Date  . Abnormal stress test    a. 10/2009: Normal myocardial perfusion imaging; b. 08/2015 MV: medium defect of mod severity in mid ant apical region w/ mild HK of the distal inf wall;  c. 08/2015 Cath: nl Cors, EF 60%.  . Anemia   . Anxiety   . Arthritis   . Bell's palsy   . Depression   . Edema   . Essential hypertension   . Frequent urination   . Frequent urination at night   . Headache    migraines, gets botox injections  . Heart palpitations June 2013   Event monitor showing sinus tachycardia, PACs with couplets and triplets.  Marland Kitchen Hx of echocardiogram    a. 10/2009 Echo: showed normal left ventricular function, mild left ventricular hypertrophy, and no significant valve abnormalities.  Marland Kitchen  Hypercholesterolemia   . Migraine   . Morbid obesity ([place])   . Neuromuscular disorder ([place])   . OSA (obstructive sleep apnea)    has been on continuous positive airway pressure  . Seizure disorder ([place])   . Seizures ([place])   . Type II diabetes mellitus ([place])    Past Surgical History:  Procedure Laterality Date  . ABDOMINAL HYSTERECTOMY  1990   without BSO  . ABDOMINAL HYSTERECTOMY  1990  . BACK SURGERY  1900s 2001   x2 with "cage  put in"  . BACK SURGERY  2001  . BILATERAL SALPINGOOPHORECTOMY  1990  . CARDIAC CATHETERIZATION N/A 08/06/2015   Procedure: Left Heart Cath and Coronary Angiography;  Surgeon: Troy Sine, MD;  Location: [facility];  Service: Cardiovascular;  Laterality: N/A;  . CARPAL TUNNEL RELEASE Bilateral   . CHOLECYSTECTOMY  1980s  . CHOLECYSTECTOMY  1980  . DORSAL COMPARTMENT RELEASE Left 10/25/2014   Procedure: LEFT FIRST  DORSAL COMPARTMENT RELEASE AND RADIAL TENOSYNOVECTOMY ;  Surgeon: Roseanne Kaufman, MD;  Location: [facility];  Service: Orthopedics;  Laterality: Left;  . HAND SURGERY    . KNEE SURGERY Right 2012   meniscus tear  . KNEE SURGERY  2012  . LAMINECTOMY  1995  . TUBAL LIGATION  1982  . WRIST SURGERY Right    Family History  Problem Relation Age of Onset  . Sarcoidosis Mother   . Seizures Mother   . Heart attack Mother   . Alzheimer's disease Maternal Grandmother   . Dementia Maternal Grandmother   . Hypertension Maternal Grandfather   . Diabetes Maternal Grandfather    Allergies  Allergen Reactions  . Morphine And Related Hives    Nausea and vomiting   . Oxycontin [Oxycodone Hcl] Swelling, Hives and Itching     Previous Medications   BACLOFEN (LIORESAL) 10 MG TABLET    Take 1 tablet (10 mg total) by mouth 3 (three) times daily.   BOTOX 100 UNITS SOLR INJECTION    Inject into the muscle every 3 (three) months.    CYCLOBENZAPRINE HCL PO    Take 1 tablet by mouth daily as needed.   EZETIMIBE-SIMVASTATIN (VYTORIN) 10-40 MG TABLET    TAKE 1 TABLET BY MOUTH AT BEDTIME. PLEASE SCHEDULE APPOINTMENT FOR REFILLS.   FLUOCINONIDE OINTMENT (LIDEX) 0.05 %    APPLY 1 APPLICATION TOPICALLY 2 (TWO) TIMES DAILY.   FUROSEMIDE (LASIX) 20 MG TABLET    Take 1 tablet (20 mg total) by mouth daily as needed for fluid or edema.   GABAPENTIN (NEURONTIN) 300 MG CAPSULE    Take 300 mg by mouth 3 (three) times daily.    GLUCOSE BLOOD TEST STRIP    Use as instructed    LAMOTRIGINE (LAMICTAL) 100 MG TABLET    Take 100 mg by mouth 2 (two) times daily.   LANCETS (ONETOUCH ULTRASOFT) LANCETS    Use as instructed   METFORMIN (GLUCOPHAGE) 500 MG TABLET    TAKE 2 TABLETS IN THE MORNING, 1 TABLET AT LUNCH TIME AND 2 TABLETS IN THE EVENING   METOPROLOL (LOPRESSOR) 50 MG TABLET    Take 1.5 tablets (75 mg total) by mouth 2 (two) times daily.   POTASSIUM CHLORIDE (MICRO-K) 10 MEQ CR CAPSULE    Take 1 capsule (10 mEq total) by mouth daily as needed.   TEGRETOL-XR 400 MG 12 HR TABLET    TAKE 1 TABLET BY MOUTH TWICE DAILY   TRIAMCINOLONE CREAM (KENALOG) 0.1 %  Apply 1 application topically 2 (two) times daily.   VILAZODONE HCL (VIIBRYD) 40 MG TABS    Take 1 tablet (40 mg total) by mouth daily.    Review of Systems  Constitutional: Negative.   HENT: Positive for sneezing.   Eyes: Positive for itching.  Respiratory: Negative.   Cardiovascular: Negative.   Endocrine: Negative.   Musculoskeletal: Positive for back pain.  Allergic/Immunologic: Positive for environmental allergies.  Neurological: Positive for headaches.    Social History  Substance Use Topics  . Smoking status: Former Smoker    Types: Cigarettes    Quit date: 01/03/1979  . Smokeless tobacco: Never Used     Comment: quit in 1984  . Alcohol use No   Objective:   BP (!) 148/82   Pulse 64   Temp 99 F (37.2 C) (Oral)   Wt 217 lb 3.2 oz (98.5 kg)   LMP  (LMP Unknown)   BMI 38.48 kg/m   Physical Exam  Constitutional: She is oriented to person, place, and time. She appears well-developed and well-nourished.  HENT:  Head: Normocephalic.  Right Ear: External ear normal.  Left Ear: External ear normal.  Mouth/Throat: Oropharynx is clear and moist.  Eyes: Conjunctivae and EOM are normal.  Neck: Neck supple. No thyromegaly present.  Cardiovascular: Normal rate, regular rhythm and normal heart sounds.   Pulmonary/Chest: Effort normal and breath sounds normal.  Abdominal: Soft. Bowel sounds are  normal.  Musculoskeletal: She exhibits tenderness.  Left low back discomfort with forward flexion. Good gait and heel-to-toe walking. Crepitus in right knee. No swelling and fair ROM. Normal pulses.  Neurological: She is alert and oriented to person, place, and time. She has normal reflexes.  Skin: No rash noted.  Psychiatric: She has a normal mood and affect. Her behavior is normal. Thought content normal.      Assessment & Plan:     1. Type 2 diabetes mellitus with hyperosmolarity without coma, without long-term current use of insulin (HCC) FBS  Averaging 130-140 at home. Trying to follow a low carbohydrate low fat diet and continues to take Metformin 500 mg 2 tablets in the morning, 1 at luch and 2 in the evenings. No side effects. Was not able to continue [place] due to poor insurance coverage and cost. Encouraged to proceed with ophthalmology and podiatry diabetes screening. Microalbumin level 50 today. Declines further medications today (ACE). Recheck routine labs. Reviewed wellness screening with patient. - POCT UA - Microalbumin - CBC with Differential/Platelet - Comprehensive metabolic panel - Hemoglobin A1c - Lipid panel  2. Essential hypertension Well controlled BP with use of Metoprolol 50 mg BID and Lasix 20 mg qd prn flare of edema. Recheck routine labs and follow up pending reports. Followed by Dr. Claiborne Billings (cardiologist) 06-01-16 - CBC with Differential/Platelet - Comprehensive metabolic panel - TSH  3. Sleep apnea, unspecified type Having difficulty using CPAP - not tolerating mask. Recommend she check with supplier for proper fit or option of nasal pillows. Dr. Claiborne Billings evaluated and scheduled a split-night evaluation.  4. Congestive heart failure, unspecified congestive heart failure chronicity, unspecified congestive heart failure type ([place]) Followed by Dr. Claiborne Billings (cardiologist) and using Lasix prn edema. - Comprehensive metabolic panel  5. Nonintractable epilepsy without  status epilepticus, unspecified epilepsy type ([place]) No seizures in 25 years. Followed by Dr. Domingo Cocking (neurologist) for epilepsy and migraines. Still taking Tegretol 400 mg BID, Gabapentin 300 mg TID and Botox injections every 3 months.  6. Hypercholesterolemia Tolerating Vytorin 10-40 mg qd. No  side effects and following low fat diet. Will get follow up labs and recheck prn. - Comprehensive metabolic panel - Lipid panel - TSH  7. Depression, major, recurrent, moderate (HCC) Feels improved on the Viibrid 40 mg qd and Lamictal 100 mg BID.Marland Kitchen   8. Seasonal allergic rhinitis due to pollen, unspecified chronicity Flare with recent warm weather and pollen counts elevated. Need refill of Flonase and may use OTC antihistamine (Claritin, Zyrtec or Allegra). - fluticasone (FLONASE) 50 MCG/ACT nasal spray; Place 2 sprays into both nostrils daily.  Dispense: 16 g; Refill: 6

## 2016-07-21 ENCOUNTER — Ambulatory Visit: Payer: 59 | Admitting: Psychiatry

## 2016-07-23 ENCOUNTER — Other Ambulatory Visit: Payer: Self-pay | Admitting: *Deleted

## 2016-07-23 MED ORDER — FUROSEMIDE 20 MG PO TABS: 20.0000 mg | ORAL_TABLET | Freq: Every day | ORAL | 5 refills | Status: DC | PRN

## 2016-07-23 NOTE — Telephone Encounter (Signed)
E-SENT  RX REFILLED

## 2016-07-29 DIAGNOSIS — M1812 Unilateral primary osteoarthritis of first carpometacarpal joint, left hand: Secondary | ICD-10-CM | POA: Diagnosis not present

## 2016-07-29 DIAGNOSIS — M25532 Pain in left wrist: Secondary | ICD-10-CM | POA: Diagnosis not present

## 2016-08-12 ENCOUNTER — Encounter (HOSPITAL_BASED_OUTPATIENT_CLINIC_OR_DEPARTMENT_OTHER): Payer: Medicare Other

## 2016-08-17 ENCOUNTER — Other Ambulatory Visit: Payer: Self-pay | Admitting: Psychiatry

## 2016-08-17 ENCOUNTER — Encounter: Payer: Self-pay | Admitting: Psychiatry

## 2016-08-17 ENCOUNTER — Ambulatory Visit (INDEPENDENT_AMBULATORY_CARE_PROVIDER_SITE_OTHER): Payer: 59 | Admitting: Psychiatry

## 2016-08-17 VITALS — BP 147/83 | HR 83 | Temp 98.2°F | Wt 211.8 lb

## 2016-08-17 DIAGNOSIS — F331 Major depressive disorder, recurrent, moderate: Secondary | ICD-10-CM | POA: Diagnosis not present

## 2016-08-17 DIAGNOSIS — F411 Generalized anxiety disorder: Secondary | ICD-10-CM

## 2016-08-17 MED ORDER — VILAZODONE HCL 40 MG PO TABS: 40.0000 mg | ORAL_TABLET | Freq: Every day | ORAL | 2 refills | Status: DC

## 2016-08-17 NOTE — Progress Notes (Signed)
Patient ID: Abigail Figueroa, female   DOB: 16-Oct-1960, 56 y.o.   MRN: [id number] [facility] MD/PA/NP OP Progress Note  08/17/2016 1:45 PM GREGORY DOWE  MRN:  [id number]  Subjective:  Patient returns for follow-up of her major depressive disorder, recurrent moderate. She has not been seen in 6 months. States she is not doing well, her relationship with husband is stressful. They are always arguing. Patient tearful and states when they argue she feels it is too much. Having some suicidal thoughts but able to contract for safety. We discussed hospitalization but patient reports that she does not need to be there. States that her daughter is very supportive. Reports she is taking the fiber regularly. She has not been seen in therapy for a long time and would like to go back to [employer].  Chief Complaint: Family stress Chief Complaint    Follow-up; Medication Refill    depression Visit Diagnosis:     ICD-9-CM ICD-10-CM   1. Major depressive disorder, recurrent episode, moderate (HCC) 296.32 F33.1   2. Generalized anxiety disorder 300.02 F41.1     Past Medical History:  Past Medical History:  Diagnosis Date  . Abnormal stress test    a. 10/2009: Normal myocardial perfusion imaging; b. 08/2015 MV: medium defect of mod severity in mid ant apical region w/ mild HK of the distal inf wall;  c. 08/2015 Cath: nl Cors, EF 60%.  . Anemia   . Anxiety   . Arthritis   . Bell's palsy   . Depression   . Edema   . Essential hypertension   . Frequent urination   . Frequent urination at night   . Headache    migraines, gets botox injections  . Heart palpitations June 2013   Event monitor showing sinus tachycardia, PACs with couplets and triplets.  Marland Kitchen Hx of echocardiogram    a. 10/2009 Echo: showed normal left ventricular function, mild left ventricular hypertrophy, and no significant valve abnormalities.  . Hypercholesterolemia   . Migraine   . Morbid obesity ([place])   . Neuromuscular disorder ([place])    . OSA (obstructive sleep apnea)    has been on continuous positive airway pressure  . Seizure disorder ([place])   . Seizures ([place])   . Type II diabetes mellitus ([place])     Past Surgical History:  Procedure Laterality Date  . ABDOMINAL HYSTERECTOMY  1990   without BSO  . ABDOMINAL HYSTERECTOMY  1990  . BACK SURGERY  1900s 2001   x2 with "cage put in"  . BACK SURGERY  2001  . BILATERAL SALPINGOOPHORECTOMY  1990  . CARDIAC CATHETERIZATION N/A 08/06/2015   Procedure: Left Heart Cath and Coronary Angiography;  Surgeon: Troy Sine, MD;  Location: [place] [facility];  Service: Cardiovascular;  Laterality: N/A;  . CARPAL TUNNEL RELEASE Bilateral   . CHOLECYSTECTOMY  1980s  . CHOLECYSTECTOMY  1980  . DORSAL COMPARTMENT RELEASE Left 10/25/2014   Procedure: LEFT FIRST  DORSAL COMPARTMENT RELEASE AND RADIAL TENOSYNOVECTOMY ;  Surgeon: Roseanne Kaufman, MD;  Location: [place];  Service: Orthopedics;  Laterality: Left;  . HAND SURGERY    . KNEE SURGERY Right 2012   meniscus tear  . KNEE SURGERY  2012  . LAMINECTOMY  1995  . TUBAL LIGATION  1982  . WRIST SURGERY Right    Family History:  Family History  Problem Relation Age of Onset  . Sarcoidosis Mother   . Seizures Mother   . Heart attack Mother   .  Alzheimer's disease Maternal Grandmother   . Dementia Maternal Grandmother   . Hypertension Maternal Grandfather   . Diabetes Maternal Grandfather    Social History:  Social History   Social History  . Marital status: Married    Spouse name: N/A  . Number of children: N/A  . Years of education: N/A   Social History Main Topics  . Smoking status: Former Smoker    Types: Cigarettes    Quit date: 01/03/1979  . Smokeless tobacco: Never Used     Comment: quit in 1984  . Alcohol use No  . Drug use: No  . Sexual activity: No   Other Topics Concern  . None   Social History Narrative   ** Merged History Encounter **       She is a married mother of 2, grandmother  3. She tries to get exercise but is not doing any routine program.   She quit smoking over 25 years ago does not drink alcohol.   Additional History:   Assessment:   Musculoskeletal: Strength & Muscle Tone: within normal limits Gait & Station: Slow and was ambulating with a walker Patient leans: N/A  Psychiatric Specialty Exam: Medication Refill     Review of Systems  Psychiatric/Behavioral: Negative for hallucinations, memory loss, substance abuse and suicidal ideas. Depression: depressionis somewhat related  dealing with her back pain. The patient is not nervous/anxious and does not have insomnia.     Blood pressure (!) 147/83, pulse 83, temperature 98.2 F (36.8 C), temperature source Oral, weight 211 lb 12.8 oz (96.1 kg).Body mass index is 37.52 kg/m.  General Appearance: Well Groomed  Eye Contact:  Good  Speech:  Clear and Coherent and Normal Rate  Volume:  Normal  Mood:  viibryd  Affect:  constricted  Thought Process:  Linear  Orientation:  Full (Time, Place, and Person)  Thought Content:  Negative  Suicidal Thoughts:  No  Homicidal Thoughts:  No  Memory:  Immediate;   Good Recent;   Good Remote;   Good  Judgement:  Good  Insight:  Good  Psychomotor Activity:  Negative  Concentration:  Good  Recall:  Good  Fund of Knowledge: Good  Language: Good  Akathisia:  Negative  Handed:  Right  AIMS (if indicated):  Not done  Assets:  Communication Skills Desire for Improvement Social Support  ADL's:  Intact  Cognition: WNL  Sleep:  ok   Is the patient at risk to self?  No. Has the patient been a risk to self in the past 6 months?  No. Has the patient been a risk to self within the distant past?  No. Is the patient a risk to others?  No. Has the patient been a risk to others in the past 6 months?  No. Has the patient been a risk to others within the distant past?  No.  Current Medications: Current Outpatient Prescriptions  Medication Sig Dispense Refill  .  baclofen (LIORESAL) 10 MG tablet Take 1 tablet (10 mg total) by mouth 3 (three) times daily. (Patient taking differently: Take 10 mg by mouth 2 (two) times daily as needed (Headaches). ) 30 each 0  . BOTOX 100 UNITS SOLR injection Inject into the muscle every 3 (three) months.     . chlorproMAZINE (THORAZINE) 25 MG tablet Take 25 mg by mouth 3 (three) times daily.    . CYCLOBENZAPRINE HCL PO Take 1 tablet by mouth daily as needed.    . ezetimibe-simvastatin (VYTORIN) 10-40 MG tablet  TAKE 1 TABLET BY MOUTH AT BEDTIME. PLEASE SCHEDULE APPOINTMENT FOR REFILLS. 30 tablet 2  . fluocinonide ointment (LIDEX) 1.51 % APPLY 1 APPLICATION TOPICALLY 2 (TWO) TIMES DAILY. 30 g 1  . furosemide (LASIX) 20 MG tablet Take 1 tablet (20 mg total) by mouth daily as needed for fluid or edema. 30 tablet 5  . gabapentin (NEURONTIN) 300 MG capsule Take 300 mg by mouth 3 (three) times daily.     Marland Kitchen glucose blood test strip Use as instructed 100 each 12  . lamoTRIgine (LAMICTAL) 100 MG tablet Take 100 mg by mouth 2 (two) times daily.    . Lancets (ONETOUCH ULTRASOFT) lancets Use as instructed 100 each 12  . metFORMIN (GLUCOPHAGE) 500 MG tablet TAKE 2 TABLETS IN THE MORNING, 1 TABLET AT LUNCH TIME AND 2 TABLETS IN THE EVENING 150 tablet 3  . metoprolol (LOPRESSOR) 50 MG tablet Take 1.5 tablets (75 mg total) by mouth 2 (two) times daily. (Patient taking differently: Take 50 mg by mouth 2 (two) times daily. ) 90 tablet 6  . potassium chloride (MICRO-K) 10 MEQ CR capsule Take 1 capsule (10 mEq total) by mouth daily as needed. (Patient taking differently: Take 10 mEq by mouth daily. ) 30 capsule 5  . TEGRETOL-XR 400 MG 12 hr tablet TAKE 1 TABLET BY MOUTH TWICE DAILY 60 tablet 4  . triamcinolone cream (KENALOG) 0.1 % Apply 1 application topically 2 (two) times daily.    . Vilazodone HCl (VIIBRYD) 40 MG TABS Take 1 tablet (40 mg total) by mouth daily. 30 tablet 4   No current facility-administered medications for this visit.      Medical Decision Making:  Established Problem, Stable/Improving (1) and Review or order clinical lab tests (1)  Treatment Plan Summary:Medication management   Major depressive disorder, recurrent, moderate-Continue Viibryd 40 mg daily.  Start seeing Miguel Dibble for therapy. Patient given her number. We discussed that if she is having any suicidal thoughts she is to call 911 or go to the nearest ER. Patient in agreement with this.  Patient takes Lamictal and chlorpromazine and Tegretol for migraine treatment. These are prescribed by another physician. Discussed with patient that she is on 2 different mood stabilizers for migraine and her seizures. We will continue to monitor her on these medications and will not restart anything at this time.   She's been encouraged call any questions or concerns prior to her next appointment. Return to clinic in 1 months time.  Tikisha Molinaro 08/17/2016, 1:45 PM

## 2016-08-18 ENCOUNTER — Other Ambulatory Visit: Payer: Self-pay | Admitting: Psychiatry

## 2016-08-18 DIAGNOSIS — F331 Major depressive disorder, recurrent, moderate: Secondary | ICD-10-CM

## 2016-08-24 ENCOUNTER — Other Ambulatory Visit: Payer: Self-pay | Admitting: Family Medicine

## 2016-08-24 DIAGNOSIS — L309 Dermatitis, unspecified: Secondary | ICD-10-CM

## 2016-08-26 DIAGNOSIS — G43719 Chronic migraine without aura, intractable, without status migrainosus: Secondary | ICD-10-CM | POA: Diagnosis not present

## 2016-09-16 ENCOUNTER — Ambulatory Visit: Payer: 59 | Admitting: Psychiatry

## 2016-09-17 ENCOUNTER — Other Ambulatory Visit: Payer: Self-pay | Admitting: Cardiovascular Disease

## 2016-09-20 ENCOUNTER — Other Ambulatory Visit: Payer: Self-pay | Admitting: Family Medicine

## 2016-09-20 ENCOUNTER — Telehealth: Payer: Self-pay | Admitting: Family Medicine

## 2016-09-20 MED ORDER — METFORMIN HCL 500 MG PO TABS: ORAL_TABLET | ORAL | 1 refills | Status: DC

## 2016-09-20 NOTE — Telephone Encounter (Signed)
[facility] faxed a 90-days supply for the following medication. Thanks CC  metFORMIN (GLUCOPHAGE) 500 MG tablet  Take 2 tablets in the morning, 1 tablet at lunch time and 2 tablets in the evening.

## 2016-09-20 NOTE — Telephone Encounter (Signed)
Last filled 07/02/16 by [name], pharmacy asking for 90day supply please review. KW

## 2016-09-20 NOTE — Telephone Encounter (Signed)
Done

## 2016-09-20 NOTE — Telephone Encounter (Signed)
ERROR

## 2016-09-30 ENCOUNTER — Encounter (HOSPITAL_BASED_OUTPATIENT_CLINIC_OR_DEPARTMENT_OTHER): Payer: Medicare Other

## 2016-10-04 ENCOUNTER — Ambulatory Visit (INDEPENDENT_AMBULATORY_CARE_PROVIDER_SITE_OTHER): Payer: Medicare Other | Admitting: Family Medicine

## 2016-10-04 ENCOUNTER — Encounter: Payer: Self-pay | Admitting: Family Medicine

## 2016-10-04 VITALS — BP 140/88 | HR 68 | Temp 98.3°F | Resp 16 | Wt 212.0 lb

## 2016-10-04 DIAGNOSIS — B029 Zoster without complications: Secondary | ICD-10-CM

## 2016-10-04 MED ORDER — FAMCICLOVIR 500 MG PO TABS: 500.0000 mg | ORAL_TABLET | Freq: Three times a day (TID) | ORAL | 0 refills | Status: DC

## 2016-10-04 NOTE — Patient Instructions (Signed)
Shingles Shingles, which is also known as herpes zoster, is an infection that causes a painful skin rash and fluid-filled blisters. Shingles is not related to genital herpes, which is a sexually transmitted infection. Shingles only develops in people who:  Have had chickenpox.  Have received the chickenpox vaccine. (This is rare.)  What are the causes? Shingles is caused by varicella-zoster virus (VZV). This is the same virus that causes chickenpox. After exposure to VZV, the virus stays in the body in an inactive (dormant) state. Shingles develops if the virus reactivates. This can happen many years after the initial exposure to VZV. It is not known what causes this virus to reactivate. What increases the risk? People who have had chickenpox or received the chickenpox vaccine are at risk for shingles. Infection is more common in people who:  Are older than age 50.  Have a weakened defense (immune) system, such as those with HIV, AIDS, or cancer.  Are taking medicines that weaken the immune system, such as transplant medicines.  Are under great stress.  What are the signs or symptoms? Early symptoms of this condition include itching, tingling, and pain in an area on your skin. Pain may be described as burning, stabbing, or throbbing. A few days or weeks after symptoms start, a painful red rash appears, usually on one side of the body in a bandlike or beltlike pattern. The rash eventually turns into fluid-filled blisters that break open, scab over, and dry up in about 2-3 weeks. At any time during the infection, you may also develop:  A fever.  Chills.  A headache.  An upset stomach.  How is this diagnosed? This condition is diagnosed with a skin exam. Sometimes, skin or fluid samples are taken from the blisters before a diagnosis is made. These samples are examined under a microscope or sent to a lab for testing. How is this treated? There is no specific cure for this condition.  Your health care provider will probably prescribe medicines to help you manage pain, recover more quickly, and avoid long-term problems. Medicines may include:  Antiviral drugs.  Anti-inflammatory drugs.  Pain medicines.  If the area involved is on your face, you may be referred to a specialist, such as an eye doctor (ophthalmologist) or an ear, nose, and throat (ENT) doctor to help you avoid eye problems, chronic pain, or disability. Follow these instructions at home: Medicines  Take medicines only as directed by your health care provider.  Apply an anti-itch or numbing cream to the affected area as directed by your health care provider. Blister and Rash Care  Take a cool bath or apply cool compresses to the area of the rash or blisters as directed by your health care provider. This may help with pain and itching.  Keep your rash covered with a loose bandage (dressing). Wear loose-fitting clothing to help ease the pain of material rubbing against the rash.  Keep your rash and blisters clean with mild soap and cool water or as directed by your health care provider.  Check your rash every day for signs of infection. These include redness, swelling, and pain that lasts or increases.  Do not pick your blisters.  Do not scratch your rash. General instructions  Rest as directed by your health care provider.  Keep all follow-up visits as directed by your health care provider. This is important.  Until your blisters scab over, your infection can cause chickenpox in people who have never had it or been vaccinated   against it. To prevent this from happening, avoid contact with other people, especially: ? Babies. ? Pregnant women. ? Children who have eczema. ? Elderly people who have transplants. ? People who have chronic illnesses, such as leukemia or AIDS. Contact a health care provider if:  Your pain is not relieved with prescribed medicines.  Your pain does not get better after  the rash heals.  Your rash looks infected. Signs of infection include redness, swelling, and pain that lasts or increases. Get help right away if:  The rash is on your face or nose.  You have facial pain, pain around your eye area, or loss of feeling on one side of your face.  You have ear pain or you have ringing in your ear.  You have loss of taste.  Your condition gets worse. This information is not intended to replace advice given to you by your health care provider. Make sure you discuss any questions you have with your health care provider. Document Released: 04/19/2005 Document Revised: 12/14/2015 Document Reviewed: 02/28/2014 Elsevier Interactive Patient Education  2017 Elsevier Inc.  

## 2016-10-04 NOTE — Progress Notes (Signed)
Patient: Abigail Figueroa Female    DOB: 1960-06-05   56 y.o.   MRN: [id number] Visit Date: 10/04/2016  Today's Provider: Vernie Murders, PA   Chief Complaint  Patient presents with  . Rash   Subjective:    Rash  This is a new problem. Episode onset: x 1 week. The problem has been gradually worsening since onset. The affected locations include the neck (pain radiating into the left shoulder). The rash is characterized by pain, itchiness and burning. She was exposed to nothing. Associated symptoms include fatigue and joint pain (shoulder). Pertinent negatives include no anorexia, congestion, cough, eye pain, facial edema, fever, nail changes, rhinorrhea, shortness of breath, sore throat or vomiting. Treatments tried: Cortisone, antihistamine, Baclofen. The treatment provided no relief.       Allergies  Allergen Reactions  . Morphine And Related Hives    Nausea and vomiting   . Oxycontin [Oxycodone Hcl] Swelling, Hives and Itching     Current Outpatient Prescriptions:  .  baclofen (LIORESAL) 10 MG tablet, Take 1 tablet (10 mg total) by mouth 3 (three) times daily. (Patient taking differently: Take 10 mg by mouth 2 (two) times daily as needed (Headaches). ), Disp: 30 each, Rfl: 0 .  BOTOX 100 UNITS SOLR injection, Inject into the muscle every 3 (three) months. , Disp: , Rfl:  .  chlorproMAZINE (THORAZINE) 25 MG tablet, Take 25 mg by mouth 3 (three) times daily., Disp: , Rfl:  .  CYCLOBENZAPRINE HCL PO, Take 1 tablet by mouth daily as needed., Disp: , Rfl:  .  ezetimibe-simvastatin (VYTORIN) 10-40 MG tablet, TAKE 1 TABLET BY MOUTH AT BEDTIME. PLEASE SCHEDULE APPOINTMENT FOR REFILLS., Disp: 30 tablet, Rfl: 0 .  fluocinonide ointment (LIDEX) 0.01 %, APPLY 1 APPLICATION TOPICALLY 2 (TWO) TIMES DAILY., Disp: 30 g, Rfl: 1 .  furosemide (LASIX) 20 MG tablet, Take 1 tablet (20 mg total) by mouth daily as needed for fluid or edema., Disp: 30 tablet, Rfl: 5 .  glucose blood test strip,  Use as instructed, Disp: 100 each, Rfl: 12 .  lamoTRIgine (LAMICTAL) 100 MG tablet, Take 100 mg by mouth 2 (two) times daily., Disp: , Rfl:  .  Lancets (ONETOUCH ULTRASOFT) lancets, Use as instructed, Disp: 100 each, Rfl: 12 .  metFORMIN (GLUCOPHAGE) 500 MG tablet, TAKE 2 TABLETS IN THE MORNING, 1 TABLET AT LUNCH TIME AND 2 TABLETS IN THE EVENING, Disp: 450 tablet, Rfl: 1 .  metoprolol (LOPRESSOR) 50 MG tablet, Take 1.5 tablets (75 mg total) by mouth 2 (two) times daily. (Patient taking differently: Take 50 mg by mouth 2 (two) times daily. ), Disp: 90 tablet, Rfl: 6 .  TEGRETOL-XR 400 MG 12 hr tablet, TAKE 1 TABLET BY MOUTH TWICE DAILY, Disp: 60 tablet, Rfl: 4 .  triamcinolone cream (KENALOG) 0.1 %, Apply 1 application topically 2 (two) times daily., Disp: , Rfl:  .  Vilazodone HCl (VIIBRYD) 40 MG TABS, Take 1 tablet (40 mg total) by mouth daily., Disp: 30 tablet, Rfl: 2 .  gabapentin (NEURONTIN) 300 MG capsule, Take 300 mg by mouth 3 (three) times daily. , Disp: , Rfl:  .  potassium chloride (MICRO-K) 10 MEQ CR capsule, Take 1 capsule (10 mEq total) by mouth daily as needed. (Patient not taking: Reported on 10/04/2016), Disp: 30 capsule, Rfl: 5  Review of Systems  Constitutional: Positive for fatigue. Negative for fever.  HENT: Negative for congestion, rhinorrhea and sore throat.   Eyes: Negative for pain.  Respiratory:  Negative for cough and shortness of breath.   Gastrointestinal: Negative for anorexia and vomiting.  Musculoskeletal: Positive for joint pain (shoulder).  Skin: Positive for rash. Negative for nail changes.    Social History  Substance Use Topics  . Smoking status: Former Smoker    Types: Cigarettes    Quit date: 01/03/1983  . Smokeless tobacco: Never Used     Comment: quit in 1984  . Alcohol use No   Objective:   BP 140/88 (BP Location: Left Arm, Patient Position: Sitting, Cuff Size: Large)   Pulse 68   Temp 98.3 F (36.8 C) (Oral)   Resp 16   Wt 212 lb (96.2 kg)    LMP  (LMP Unknown)   BMI 37.55 kg/m  Vitals:   10/04/16 1024  BP: 140/88  Pulse: 68  Resp: 16  Temp: 98.3 F (36.8 C)  TempSrc: Oral  Weight: 212 lb (96.2 kg)     Physical Exam  Constitutional: She is oriented to person, place, and time. She appears well-developed and well-nourished. No distress.  HENT:  Head: Normocephalic and atraumatic.  Right Ear: Hearing normal.  Left Ear: Hearing normal.  Nose: Nose normal.  Eyes: Conjunctivae and lids are normal. Right eye exhibits no discharge. Left eye exhibits no discharge. No scleral icterus.  Neck: Neck supple.  Pulmonary/Chest: Effort normal. No respiratory distress.  Musculoskeletal: Normal range of motion.  Neurological: She is alert and oriented to person, place, and time.  Skin: Skin is intact. No lesion and no rash noted.  Cluster of very tender blisters at the occipital hairline and tenderness behind the left ear. Tender along the left upper back and shoulder without further rash.   Psychiatric: She has a normal mood and affect. Her speech is normal and behavior is normal. Thought content normal.       Assessment & Plan:     1. Herpes zoster without complication Cluster of painful blisters at the midline occiput and radiation of pain into the left shoulder over the past week. Suspect shingles and will treat with Famvir. May use Lidocaine cream for discomfort. Recheck if no better in 2 weeks. - famciclovir (FAMVIR) 500 MG tablet; Take 1 tablet (500 mg total) by mouth 3 (three) times daily.  Dispense: 21 tablet; Refill: [place]  [facility]

## 2016-10-12 ENCOUNTER — Ambulatory Visit: Payer: 59 | Admitting: Psychiatry

## 2016-10-20 ENCOUNTER — Ambulatory Visit: Payer: 59 | Admitting: Psychiatry

## 2016-11-01 ENCOUNTER — Encounter: Payer: Self-pay | Admitting: Family Medicine

## 2016-11-01 ENCOUNTER — Ambulatory Visit (INDEPENDENT_AMBULATORY_CARE_PROVIDER_SITE_OTHER): Payer: Medicare Other | Admitting: Family Medicine

## 2016-11-01 VITALS — BP 138/82 | HR 62 | Temp 98.1°F | Resp 14 | Wt 213.0 lb

## 2016-11-01 DIAGNOSIS — B0229 Other postherpetic nervous system involvement: Secondary | ICD-10-CM | POA: Diagnosis not present

## 2016-11-01 NOTE — Patient Instructions (Signed)
Postherpetic Neuralgia Postherpetic neuralgia (PHN) is nerve pain that occurs after a shingles infection. Shingles is a painful rash that appears on one side of the body, usually on your trunk or face. Shingles is caused by the varicella-zoster virus. This is the same virus that causes chickenpox. In people who have had chickenpox, the virus can resurface years later and cause shingles. You may have PHN if you continue to have pain for 3 months after your shingles rash has gone away. PHN appears in the same area where you had the shingles rash. For most people, PHN goes away within 1 year. Getting a vaccination for shingles can prevent PHN. This vaccine is recommended for people older than 50. It may prevent shingles and may also lower your risk of PHN if you do get shingles. What are the causes? PHN is caused by damage to your nerves from the varicella-zoster virus. This damage makes your nerves overly sensitive. What increases the risk? Aging is the biggest risk factor for developing PHN. Most people who get PHN are older than 41. Other risk factors include:  Having very bad pain before your shingles rash starts.  Having a very bad rash.  Having shingles in the nerve that supplies your face and eye (trigeminal nerve).  What are the signs or symptoms? Pain is the main symptom of PHN. The pain is often very bad and may be described as stabbing, burning, or feeling like an electric shock. The pain may come and go or may be there all the time. Pain may be triggered by light touches on the skin or changes in temperature. You may have itching along with the pain. How is this diagnosed? Your health care provider may diagnose PHN based on your symptoms and your history of shingles. Lab studies and other diagnostic tests are usually not needed. How is this treated? There is no cure for PHN. Treatment for PHN will focus on pain relief. Over-the-counter pain relievers do not usually relieve PHN pain. You  may need to work with a pain specialist. Treatment may include:  Antidepressant medicines to help with pain and improve sleep.  Antiseizure medicines to relieve nerve pain.  Strong pain relievers (opioids).  A numbing patch worn on the skin (lidocaine patch).  Follow these instructions at home: It may take a long time to recover from PHN. Work closely with your health care provider, and have a good support system at home.  Take all medicines as directed by your health care provider.  Wear loose, comfortable clothing.  Cover sensitive areas with a dressing to reduce friction from clothing rubbing on the area.  If cold does not make your pain worse, try applying a cool compress or cooling gel pack to the area.  Talk to your health care provider if you feel depressed or desperate. Living with long-term pain can be depressing.  Contact a health care provider if:  Your medicine is not helping.  You are struggling to manage your pain at home. This information is not intended to replace advice given to you by your health care provider. Make sure you discuss any questions you have with your health care provider. Document Released: 07/10/2002 Document Revised: 09/25/2015 Document Reviewed: 04/10/2013 Elsevier Interactive Patient Education  [employer].

## 2016-11-01 NOTE — Progress Notes (Signed)
Patient: Abigail Figueroa Female    DOB: 1961/04/16   56 y.o.   MRN: [id number] Visit Date: 11/01/2016  Today's Provider: Vernie Murders, PA   Chief Complaint  Patient presents with  . Herpes Zoster   Subjective:    HPI  Patient is here to follow up from last office visit on 10/04/16 when patient was diagnosed with shingles and started on Famvir which she has finished and was advised to Lidocaine for discomfort. Patient used cream once. Patient states she was taking Gabapentin also when she had shingles break out but not taking at this time. The break out around the back of her head resolved but she is still having pain/burning sensation in the area of break out-upper back. Also some discoloration present to the skin too. No itching.  Patient Active Problem List   Diagnosis Date Noted  . Right-sided Bell's palsy 02/02/2016  . Spondylolisthesis of lumbar region 08/25/2015  . Type II diabetes mellitus ([place])   . Abnormal stress test   . Hypercholesterolemia   . Abnormal nuclear stress test 07/30/2015  . Preoperative clearance 07/22/2015  . Lower extremity edema 07/22/2015  . History of brain disorder 11/05/2014  . H/O: obesity 11/05/2014  . Depression, major, recurrent, moderate ([place]) 11/05/2014  . Difficulty with family 11/05/2014  . Feeling stressed out 11/05/2014  . H/O: HTN (hypertension) 11/05/2014  . H/O elevated lipids 11/05/2014  . Anxiety, generalized 11/05/2014  . Migraine headache 11/05/2014  . Moderate recurrent major depression ([place]) 10/16/2014  . Generalized anxiety disorder 10/16/2014  . CHF (congestive heart failure) ([place]) 10/14/2014  . Edema 10/14/2014  . Depressive disorder 10/14/2014  . Obstructive sleep apnea 10/14/2014  . Diabetes ([place]) 10/14/2014  . Essential hypertension 10/14/2014  . Epilepsy ([place]) 10/14/2014  . Migraine 10/14/2014  . Hyperlipemia 10/14/2014  . Dysthymic disorder 10/14/2014  . Seizure ([place]) 10/14/2014  . Hypercholesteremia  10/14/2014  . Palpitation 10/14/2014  . Depression 10/14/2014  . Dyspepsia 10/14/2014  . Hematochezia 10/14/2014  . Urge incontinence 10/14/2014  . Hair thinning 10/14/2014  . Dermatitis, eczematoid 09/10/2014  . Acid indigestion 09/10/2014  . Alopecia 09/10/2014  . Blood in feces 09/10/2014  . H/O hypercholesterolemia 09/10/2014  . Arthralgia of hip 09/10/2014  . Feeling bilious 09/10/2014  . Awareness of heartbeats 09/10/2014  . Pain in the wrist 09/10/2014  . Convulsions ([place]) 09/10/2014  . Urge incontinence 09/10/2014  . Snapping thumb syndrome 04/19/2014  . Diabetes mellitus ([place]) 03/14/2014  . De Quervain's disease (radial styloid tenosynovitis) 03/08/2014  . LBP (low back pain) 03/07/2013  . Mixed hyperlipidemia 01/15/2013  . Encounter for long-term (current) use of other medications - statin 01/15/2013  . Fatigue 01/15/2013  . Heart palpation 01/15/2013  . Obesity (BMI 30-39.9) 01/02/2013  . CCF (congestive cardiac failure) ([place]) 11/13/2009  . Malaise and fatigue 02/17/2009  . Sprain and strain of interphalangeal (joint) of hand 10/23/2007  . HEADACHE 06/23/2007  . Diabetes mellitus type 2 in obese ([place]) 05/08/2007  . HYPERCALCEMIA 05/08/2007  . Sleep apnea 05/08/2007  . Edema 05/08/2007  . DIARRHEA, CHRONIC 05/08/2007  . Clinical depression 10/20/2006  . Obstructive apnea 03/22/2006  . Diabetes mellitus, type 2 ([place]) 11/12/2005  . Depression, neurotic 11/12/2005  . Epilepsy ([place]) 11/12/2005  . Combined fat and carbohydrate induced hyperlipemia 11/12/2005  . Acute onset aura migraine 11/12/2005   Past Surgical History:  Procedure Laterality Date  . ABDOMINAL HYSTERECTOMY  1990   without BSO  . ABDOMINAL HYSTERECTOMY  1990  .  BACK SURGERY  1900s 2001   x2 with "cage put in"  . BACK SURGERY  2001  . BILATERAL SALPINGOOPHORECTOMY  1990  . CARDIAC CATHETERIZATION N/A 08/06/2015   Procedure: Left Heart Cath and Coronary Angiography;  Surgeon: Troy Sine, MD;   Location: [facility];  Service: Cardiovascular;  Laterality: N/A;  . CARPAL TUNNEL RELEASE Bilateral   . CHOLECYSTECTOMY  1980s  . CHOLECYSTECTOMY  1980  . DORSAL COMPARTMENT RELEASE Left 10/25/2014   Procedure: LEFT FIRST  DORSAL COMPARTMENT RELEASE AND RADIAL TENOSYNOVECTOMY ;  Surgeon: Roseanne Kaufman, MD;  Location: [facility];  Service: Orthopedics;  Laterality: Left;  . HAND SURGERY    . KNEE SURGERY Right 2012   meniscus tear  . KNEE SURGERY  2012  . LAMINECTOMY  1995  . TUBAL LIGATION  1982  . WRIST SURGERY Right    Family History  Problem Relation Age of Onset  . Sarcoidosis Mother   . Seizures Mother   . Heart attack Mother   . Alzheimer's disease Maternal Grandmother   . Dementia Maternal Grandmother   . Hypertension Maternal Grandfather   . Diabetes Maternal Grandfather      Allergies  Allergen Reactions  . Morphine And Related Hives    Nausea and vomiting   . Oxycontin [Oxycodone Hcl] Swelling, Hives and Itching     Current Outpatient Prescriptions:  .  baclofen (LIORESAL) 10 MG tablet, Take 1 tablet (10 mg total) by mouth 3 (three) times daily. (Patient taking differently: Take 10 mg by mouth 2 (two) times daily as needed (Headaches). ), Disp: 30 each, Rfl: 0 .  BOTOX 100 UNITS SOLR injection, Inject into the muscle every 3 (three) months. , Disp: , Rfl:  .  chlorproMAZINE (THORAZINE) 25 MG tablet, Take 25 mg by mouth 3 (three) times daily., Disp: , Rfl:  .  CYCLOBENZAPRINE HCL PO, Take 1 tablet by mouth daily as needed., Disp: , Rfl:  .  ezetimibe-simvastatin (VYTORIN) 10-40 MG tablet, TAKE 1 TABLET BY MOUTH AT BEDTIME. PLEASE SCHEDULE APPOINTMENT FOR REFILLS., Disp: 30 tablet, Rfl: 0 .  fluocinonide ointment (LIDEX) 4.09 %, APPLY 1 APPLICATION TOPICALLY 2 (TWO) TIMES DAILY., Disp: 30 g, Rfl: 1 .  furosemide (LASIX) 20 MG tablet, Take 1 tablet (20 mg total) by mouth daily as needed for fluid or edema., Disp: 30 tablet, Rfl: 5 .  glucose  blood test strip, Use as instructed, Disp: 100 each, Rfl: 12 .  lamoTRIgine (LAMICTAL) 100 MG tablet, Take 100 mg by mouth 2 (two) times daily., Disp: , Rfl:  .  Lancets (ONETOUCH ULTRASOFT) lancets, Use as instructed, Disp: 100 each, Rfl: 12 .  metFORMIN (GLUCOPHAGE) 500 MG tablet, TAKE 2 TABLETS IN THE MORNING, 1 TABLET AT LUNCH TIME AND 2 TABLETS IN THE EVENING, Disp: 450 tablet, Rfl: 1 .  metoprolol (LOPRESSOR) 50 MG tablet, Take 1.5 tablets (75 mg total) by mouth 2 (two) times daily. (Patient taking differently: Take 50 mg by mouth 2 (two) times daily. ), Disp: 90 tablet, Rfl: 6 .  potassium chloride (MICRO-K) 10 MEQ CR capsule, Take 1 capsule (10 mEq total) by mouth daily as needed., Disp: 30 capsule, Rfl: 5 .  TEGRETOL-XR 400 MG 12 hr tablet, TAKE 1 TABLET BY MOUTH TWICE DAILY, Disp: 60 tablet, Rfl: 4 .  triamcinolone cream (KENALOG) 0.1 %, Apply 1 application topically 2 (two) times daily., Disp: , Rfl:  .  Vilazodone HCl (VIIBRYD) 40 MG TABS, Take 1 tablet (40 mg  total) by mouth daily., Disp: 30 tablet, Rfl: 2 .  gabapentin (NEURONTIN) 300 MG capsule, Take 300 mg by mouth 3 (three) times daily. , Disp: , Rfl:   Review of Systems  Constitutional: Negative.   Respiratory: Negative.   Cardiovascular: Negative.   Musculoskeletal: Positive for arthralgias and myalgias.  Skin:       Discoloration upper back./neck area.    Social History  Substance Use Topics  . Smoking status: Former Smoker    Types: Cigarettes    Quit date: 01/03/1983  . Smokeless tobacco: Never Used     Comment: quit in 1984  . Alcohol use No   Objective:   BP 138/82   Pulse 62   Temp 98.1 F (36.7 C)   Resp 14   Wt 213 lb (96.6 kg)   LMP  (LMP Unknown)   BMI 37.73 kg/m    Wt Readings from Last 3 Encounters:  11/01/16 213 lb (96.6 kg)  10/04/16 212 lb (96.2 kg)  07/20/16 217 lb 3.2 oz (98.5 kg)    Vitals:   11/01/16 1155  BP: 138/82  Pulse: 62  Resp: 14  Temp: 98.1 F (36.7 C)  Weight: 213 lb  (96.6 kg)     Physical Exam  Constitutional: She is oriented to person, place, and time. She appears well-developed and well-nourished. No distress.  HENT:  Head: Normocephalic and atraumatic.  Right Ear: Hearing normal.  Left Ear: Hearing normal.  Nose: Nose normal.  Eyes: Conjunctivae and lids are normal. Right eye exhibits no discharge. Left eye exhibits no discharge. No scleral icterus.  Neck: Neck supple.  Pulmonary/Chest: Effort normal. No respiratory distress.  Musculoskeletal: Normal range of motion.  Neurological: She is alert and oriented to person, place, and time.  Skin: Skin is intact. No lesion and no rash noted.  Very tender to light touch in the posterior neck, upper shoulders and right occipital areas where she had shingles. No rash noted.  Psychiatric: She has a normal mood and affect. Her speech is normal and behavior is normal. Thought content normal.      Assessment & Plan:     1. Post herpetic neuralgia No further rash. Has some tenderness in the right posterior neck. May use Capsaicin Cream prn. Consider restarting the Gabapentin 300 mg up to TID. Recheck prn.       Vernie Murders, PA  [facility]

## 2016-11-09 ENCOUNTER — Ambulatory Visit: Payer: 59 | Admitting: Psychiatry

## 2016-11-11 ENCOUNTER — Ambulatory Visit (INDEPENDENT_AMBULATORY_CARE_PROVIDER_SITE_OTHER): Payer: 59 | Admitting: Psychiatry

## 2016-11-11 ENCOUNTER — Encounter: Payer: Self-pay | Admitting: Psychiatry

## 2016-11-11 ENCOUNTER — Ambulatory Visit: Payer: 59 | Admitting: Psychiatry

## 2016-11-11 VITALS — BP 148/81 | HR 78 | Temp 98.5°F | Wt 210.6 lb

## 2016-11-11 DIAGNOSIS — F411 Generalized anxiety disorder: Secondary | ICD-10-CM

## 2016-11-11 DIAGNOSIS — F331 Major depressive disorder, recurrent, moderate: Secondary | ICD-10-CM | POA: Diagnosis not present

## 2016-11-11 MED ORDER — VILAZODONE HCL 40 MG PO TABS: 40.0000 mg | ORAL_TABLET | Freq: Every day | ORAL | 2 refills | Status: DC

## 2016-11-11 NOTE — Progress Notes (Signed)
Patient ID: Abigail Figueroa, female   DOB: [birth year], 56 y.o.   MRN: [id number] [facility] MD/PA/NP OP Progress Note  11/11/2016 2:51 PM Abigail Figueroa  MRN:  [id number]  Subjective:  Patient returns for follow-up of her major depressive disorder, recurrent moderate. She has not been seen in 6 months. She is doing ok moodwise and she has started seeing [employer]. States seeing Otila Kluver and it has been helping her deal with certain stressors. Reports that she has not been eating that well but she has been trying to watch her weight. Reports she is also not sleeping well. States it's complicated. We discussed starting her on trazodone and her doctor reports that she is on too many medications. Denies any suicidal thoughts.  Chief Complaint: Family stress, doing slightly better Chief Complaint    Follow-up; Medication Refill     Visit Diagnosis:     ICD-10-CM   1. Major depressive disorder, recurrent episode, moderate (HCC) F33.1   2. Generalized anxiety disorder F41.1     Past Medical History:  Past Medical History:  Diagnosis Date  . Abnormal stress test    a. 10/2009: Normal myocardial perfusion imaging; b. 08/2015 MV: medium defect of mod severity in mid ant apical region w/ mild HK of the distal inf wall;  c. 08/2015 Cath: nl Cors, EF 60%.  . Anemia   . Anxiety   . Arthritis   . Bell's palsy   . Depression   . Edema   . Essential hypertension   . Frequent urination   . Frequent urination at night   . Headache    migraines, gets botox injections  . Heart palpitations June 2013   Event monitor showing sinus tachycardia, PACs with couplets and triplets.  Marland Kitchen Hx of echocardiogram    a. 10/2009 Echo: showed normal left ventricular function, mild left ventricular hypertrophy, and no significant valve abnormalities.  . Hypercholesterolemia   . Migraine   . Morbid obesity ([place])   . Neuromuscular disorder ([place])   . OSA (obstructive sleep apnea)    has been on continuous positive airway  pressure  . Seizure disorder ([place])   . Seizures ([place])   . Type II diabetes mellitus ([place])     Past Surgical History:  Procedure Laterality Date  . ABDOMINAL HYSTERECTOMY  1990   without BSO  . ABDOMINAL HYSTERECTOMY  1990  . BACK SURGERY  1900s 2001   x2 with "cage put in"  . BACK SURGERY  2001  . BILATERAL SALPINGOOPHORECTOMY  1990  . CARDIAC CATHETERIZATION N/A 08/06/2015   Procedure: Left Heart Cath and Coronary Angiography;  Surgeon: Troy Sine, MD;  Location: [facility];  Service: Cardiovascular;  Laterality: N/A;  . CARPAL TUNNEL RELEASE Bilateral   . CHOLECYSTECTOMY  1980s  . CHOLECYSTECTOMY  1980  . DORSAL COMPARTMENT RELEASE Left 10/25/2014   Procedure: LEFT FIRST  DORSAL COMPARTMENT RELEASE AND RADIAL TENOSYNOVECTOMY ;  Surgeon: Roseanne Kaufman, MD;  Location: [facility];  Service: Orthopedics;  Laterality: Left;  . HAND SURGERY    . KNEE SURGERY Right 2012   meniscus tear  . KNEE SURGERY  2012  . LAMINECTOMY  1995  . TUBAL LIGATION  1982  . WRIST SURGERY Right    Family History:  Family History  Problem Relation Age of Onset  . Sarcoidosis Mother   . Seizures Mother   . Heart attack Mother   . Alzheimer's disease Maternal Grandmother   . Dementia Maternal Grandmother   .  Hypertension Maternal Grandfather   . Diabetes Maternal Grandfather    Social History:  Social History   Social History  . Marital status: Married    Spouse name: N/A  . Number of children: N/A  . Years of education: N/A   Social History Main Topics  . Smoking status: Former Smoker    Types: Cigarettes    Quit date: 01/03/1983  . Smokeless tobacco: Never Used     Comment: quit in 1984  . Alcohol use No  . Drug use: No  . Sexual activity: No   Other Topics Concern  . None   Social History Narrative   ** Merged History Encounter **       She is a married mother of 2, grandmother 3. She tries to get exercise but is not doing any routine program.   She quit  smoking over 25 years ago does not drink alcohol.   Additional History:   Assessment:   Musculoskeletal: Strength & Muscle Tone: within normal limits Gait & Station: Slow and was ambulating with a walker Patient leans: N/A  Psychiatric Specialty Exam: Medication Refill     Review of Systems  Psychiatric/Behavioral: Negative for hallucinations, memory loss, substance abuse and suicidal ideas. Depression: depressionis somewhat related  dealing with her back pain. The patient is not nervous/anxious and does not have insomnia.     Blood pressure (!) 148/81, pulse 78, temperature 98.5 F (36.9 C), temperature source Oral, weight 210 lb 9.6 oz (95.5 kg).Body mass index is 37.31 kg/m.  General Appearance: Well Groomed  Eye Contact:  Good  Speech:  Clear and Coherent and Normal Rate  Volume:  Normal  Mood:  viibryd  Affect:  pleasant  Thought Process:  Linear  Orientation:  Full (Time, Place, and Person)  Thought Content:  Negative  Suicidal Thoughts:  No  Homicidal Thoughts:  No  Memory:  Immediate;   Good Recent;   Good Remote;   Good  Judgement:  Good  Insight:  Good  Psychomotor Activity:  Negative  Concentration:  Good  Recall:  Good  Fund of Knowledge: Good  Language: Good  Akathisia:  Negative  Handed:  Right  AIMS (if indicated):  Not done  Assets:  Communication Skills Desire for Improvement Social Support  ADL's:  Intact  Cognition: WNL  Sleep:  ok   Is the patient at risk to self?  No. Has the patient been a risk to self in the past 6 months?  No. Has the patient been a risk to self within the distant past?  No. Is the patient a risk to others?  No. Has the patient been a risk to others in the past 6 months?  No. Has the patient been a risk to others within the distant past?  No.  Current Medications: Current Outpatient Prescriptions  Medication Sig Dispense Refill  . baclofen (LIORESAL) 10 MG tablet Take 1 tablet (10 mg total) by mouth 3 (three) times  daily. (Patient taking differently: Take 10 mg by mouth 2 (two) times daily as needed (Headaches). ) 30 each 0  . BOTOX 100 UNITS SOLR injection Inject into the muscle every 3 (three) months.     . chlorproMAZINE (THORAZINE) 25 MG tablet Take 25 mg by mouth 3 (three) times daily.    . CYCLOBENZAPRINE HCL PO Take 1 tablet by mouth daily as needed.    . ezetimibe-simvastatin (VYTORIN) 10-40 MG tablet TAKE 1 TABLET BY MOUTH AT BEDTIME. PLEASE SCHEDULE APPOINTMENT FOR REFILLS. [place]  tablet 0  . fluocinonide ointment (LIDEX) 8.34 % APPLY 1 APPLICATION TOPICALLY 2 (TWO) TIMES DAILY. 30 g 1  . furosemide (LASIX) 20 MG tablet Take 1 tablet (20 mg total) by mouth daily as needed for fluid or edema. 30 tablet 5  . gabapentin (NEURONTIN) 300 MG capsule Take 300 mg by mouth 3 (three) times daily.     Marland Kitchen glucose blood test strip Use as instructed 100 each 12  . lamoTRIgine (LAMICTAL) 100 MG tablet Take 100 mg by mouth 2 (two) times daily.    . Lancets (ONETOUCH ULTRASOFT) lancets Use as instructed 100 each 12  . metFORMIN (GLUCOPHAGE) 500 MG tablet TAKE 2 TABLETS IN THE MORNING, 1 TABLET AT LUNCH TIME AND 2 TABLETS IN THE EVENING 450 tablet 1  . metoprolol (LOPRESSOR) 50 MG tablet Take 1.5 tablets (75 mg total) by mouth 2 (two) times daily. (Patient taking differently: Take 50 mg by mouth 2 (two) times daily. ) 90 tablet 6  . potassium chloride (MICRO-K) 10 MEQ CR capsule Take 1 capsule (10 mEq total) by mouth daily as needed. 30 capsule 5  . TEGRETOL-XR 400 MG 12 hr tablet TAKE 1 TABLET BY MOUTH TWICE DAILY 60 tablet 4  . triamcinolone cream (KENALOG) 0.1 % Apply 1 application topically 2 (two) times daily.    . Vilazodone HCl (VIIBRYD) 40 MG TABS Take 1 tablet (40 mg total) by mouth daily. 30 tablet 2   No current facility-administered medications for this visit.     Medical Decision Making:  Established Problem, Stable/Improving (1) and Review or order clinical lab tests (1)  Treatment Plan  Summary:Medication management   Major depressive disorder, recurrent, moderate-Continue Viibryd 40 mg daily.  She has started seeing Miguel Dibble for therapy.  We discussed that if she is having any suicidal thoughts she is to call 911 or go to the nearest ER. Patient in agreement with this.  Patient takes Lamictal and chlorpromazine and Tegretol for migraine treatment. These are prescribed by another physician. Discussed with patient that she is on 2 different mood stabilizers for migraine and her seizures. We will continue to monitor her on these medications and will not restart anything at this time.   She's been encouraged call any questions or concerns prior to her next appointment. Return to clinic in 3 months time.  Branden Shallenberger 11/11/2016, 2:51 PM

## 2016-11-15 ENCOUNTER — Other Ambulatory Visit: Payer: Self-pay | Admitting: Family Medicine

## 2016-11-15 ENCOUNTER — Telehealth: Payer: Self-pay | Admitting: Family Medicine

## 2016-11-15 DIAGNOSIS — E669 Obesity, unspecified: Principal | ICD-10-CM

## 2016-11-15 DIAGNOSIS — E1169 Type 2 diabetes mellitus with other specified complication: Secondary | ICD-10-CM

## 2016-11-15 DIAGNOSIS — E119 Type 2 diabetes mellitus without complications: Secondary | ICD-10-CM

## 2016-11-15 MED ORDER — ONETOUCH ULTRASOFT LANCETS MISC: 12 refills | Status: DC

## 2016-11-15 MED ORDER — GLUCOSE BLOOD VI STRP: ORAL_STRIP | 12 refills | Status: DC

## 2016-11-15 NOTE — Telephone Encounter (Signed)
Pt called saying [facility] on [address]  is sending an order over for her One Touch lancets and tet strips.  Pt says the order needs to say to use 3 times a day.  She needs this for insurance purposes. They are saying she is getting it filled too soon.  Pt's call back [phone]    Thanks. teri

## 2016-11-15 NOTE — Telephone Encounter (Signed)
70-100 is perfect !! With less Metformin and these blood levels, should schedule for labs to recheck Hgb A1C and decide if further adjustments are needed. Should be able to get you down to testing once a day so insurance will be satisfied. Will [facility]ill [facility]ets and test strips [facility].

## 2016-11-15 NOTE — Telephone Encounter (Signed)
Patient advised. She states she is taking blood sugars more frequently due to hypoglycemic episodes. She states her blood sugars have been running 70-100's. Patient states she has been checking sugar in the morning when she gets up, 2 hrs prior to lunch and 2 hours prior to dinner. She also decreased Metformin due to sugars being low. She is now taking One tablet at breakfast, lunch, and dinner.

## 2016-11-15 NOTE — Telephone Encounter (Signed)
Doesn't need to check blood more than once a day unless she is taking insulin or having symptoms of hypoglycemia and needing changes in medications.

## 2016-11-15 NOTE — Telephone Encounter (Signed)
Patient advised. She states she is getting her labs that were ordered on 07/20/2016 done in the morning.

## 2016-11-26 DIAGNOSIS — M4316 Spondylolisthesis, lumbar region: Secondary | ICD-10-CM | POA: Diagnosis not present

## 2016-11-26 DIAGNOSIS — M545 Low back pain: Secondary | ICD-10-CM | POA: Diagnosis not present

## 2016-11-26 DIAGNOSIS — I10 Essential (primary) hypertension: Secondary | ICD-10-CM | POA: Diagnosis not present

## 2016-11-26 DIAGNOSIS — G518 Other disorders of facial nerve: Secondary | ICD-10-CM | POA: Diagnosis not present

## 2016-11-26 DIAGNOSIS — M791 Myalgia: Secondary | ICD-10-CM | POA: Diagnosis not present

## 2016-11-26 DIAGNOSIS — G43719 Chronic migraine without aura, intractable, without status migrainosus: Secondary | ICD-10-CM | POA: Diagnosis not present

## 2016-11-26 DIAGNOSIS — M542 Cervicalgia: Secondary | ICD-10-CM | POA: Diagnosis not present

## 2016-12-03 ENCOUNTER — Encounter (HOSPITAL_BASED_OUTPATIENT_CLINIC_OR_DEPARTMENT_OTHER): Payer: Medicare Other

## 2016-12-06 ENCOUNTER — Other Ambulatory Visit: Payer: Self-pay | Admitting: *Deleted

## 2016-12-06 ENCOUNTER — Other Ambulatory Visit: Payer: Self-pay

## 2016-12-06 MED ORDER — FLUTICASONE PROPIONATE 50 MCG/ACT NA SUSP: NASAL | 3 refills | Status: DC

## 2016-12-06 MED ORDER — METOPROLOL TARTRATE 50 MG PO TABS
75.0000 mg | ORAL_TABLET | Freq: Two times a day (BID) | ORAL | 0 refills | Status: DC
Start: 2016-12-06 — End: 2016-12-09

## 2016-12-06 MED ORDER — FUROSEMIDE 20 MG PO TABS: 20.0000 mg | ORAL_TABLET | Freq: Every day | ORAL | 0 refills | Status: DC | PRN

## 2016-12-06 NOTE — Telephone Encounter (Signed)
[facility] request [facility] [address] and request for 90 day supply.-aa

## 2016-12-09 ENCOUNTER — Other Ambulatory Visit: Payer: Self-pay

## 2016-12-09 MED ORDER — METOPROLOL TARTRATE 50 MG PO TABS: 75.0000 mg | ORAL_TABLET | Freq: Two times a day (BID) | ORAL | 2 refills | Status: DC

## 2016-12-09 MED ORDER — FUROSEMIDE 20 MG PO TABS: 20.0000 mg | ORAL_TABLET | Freq: Every day | ORAL | 0 refills | Status: DC | PRN

## 2016-12-26 IMAGING — RF DG LUMBAR SPINE 2-3V
1 series · 3 of 3 positions shown · non-contrast
Comparison: Intraoperative radiographs of earlier today.

CLINICAL DATA: L4-5 decompression.

EXAM:
LUMBAR SPINE - 2-3 VIEW

[Series 1: run · 3 of 3 slices shown]
[im 1/3]
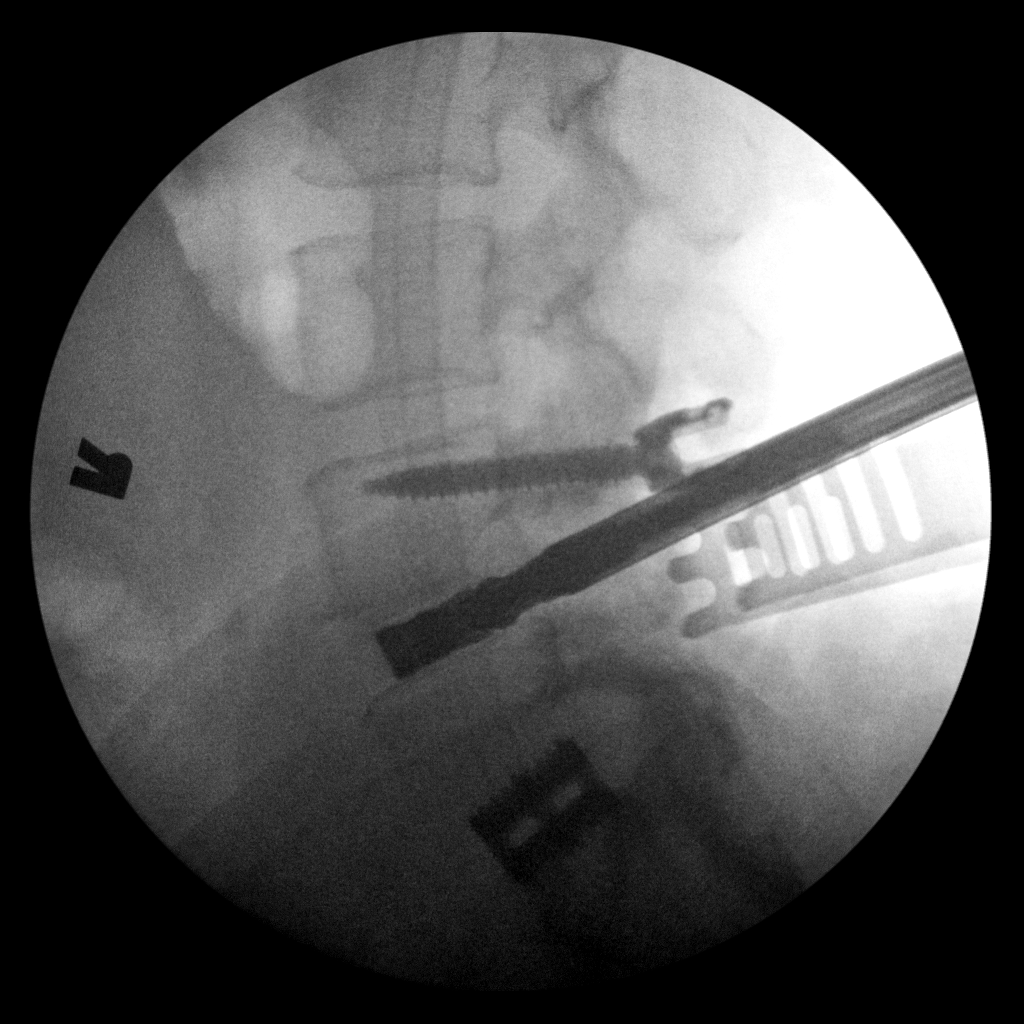
[im 2/3]
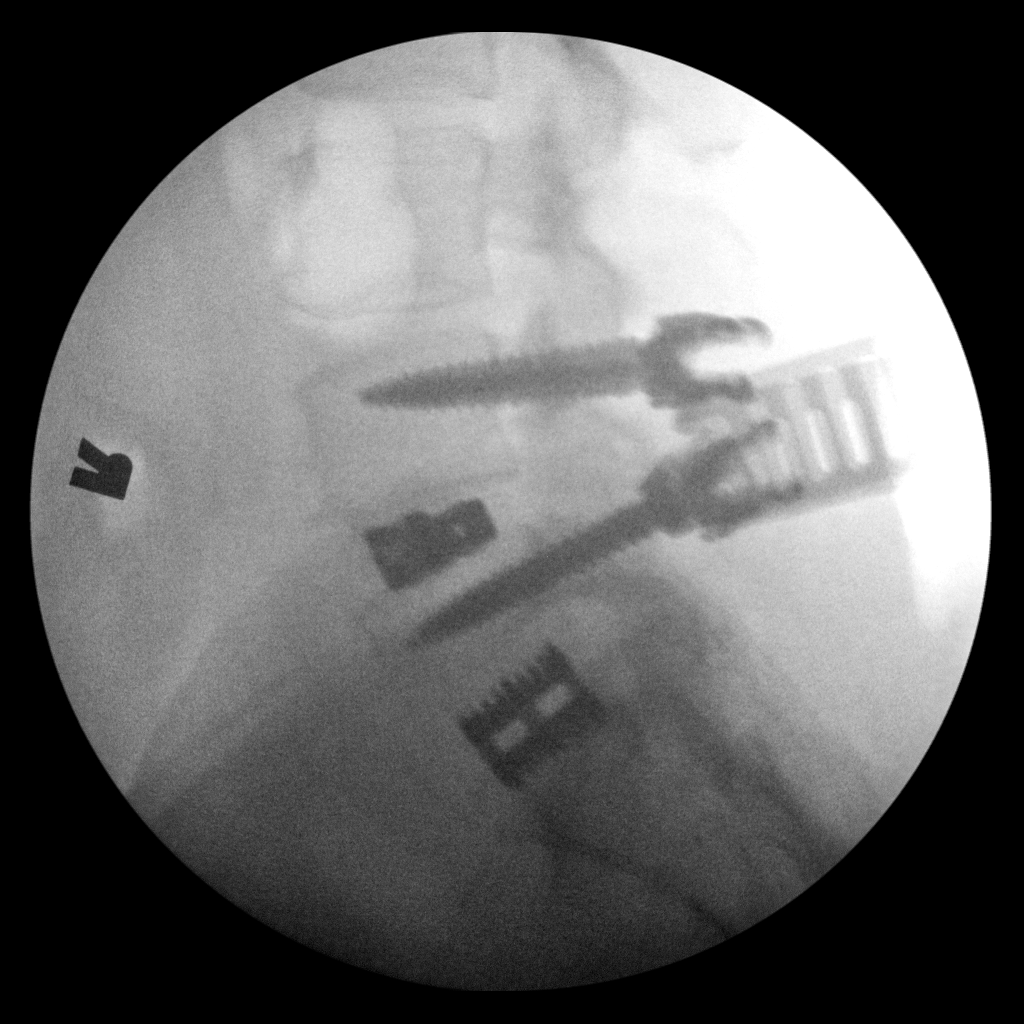
[im 3/3]
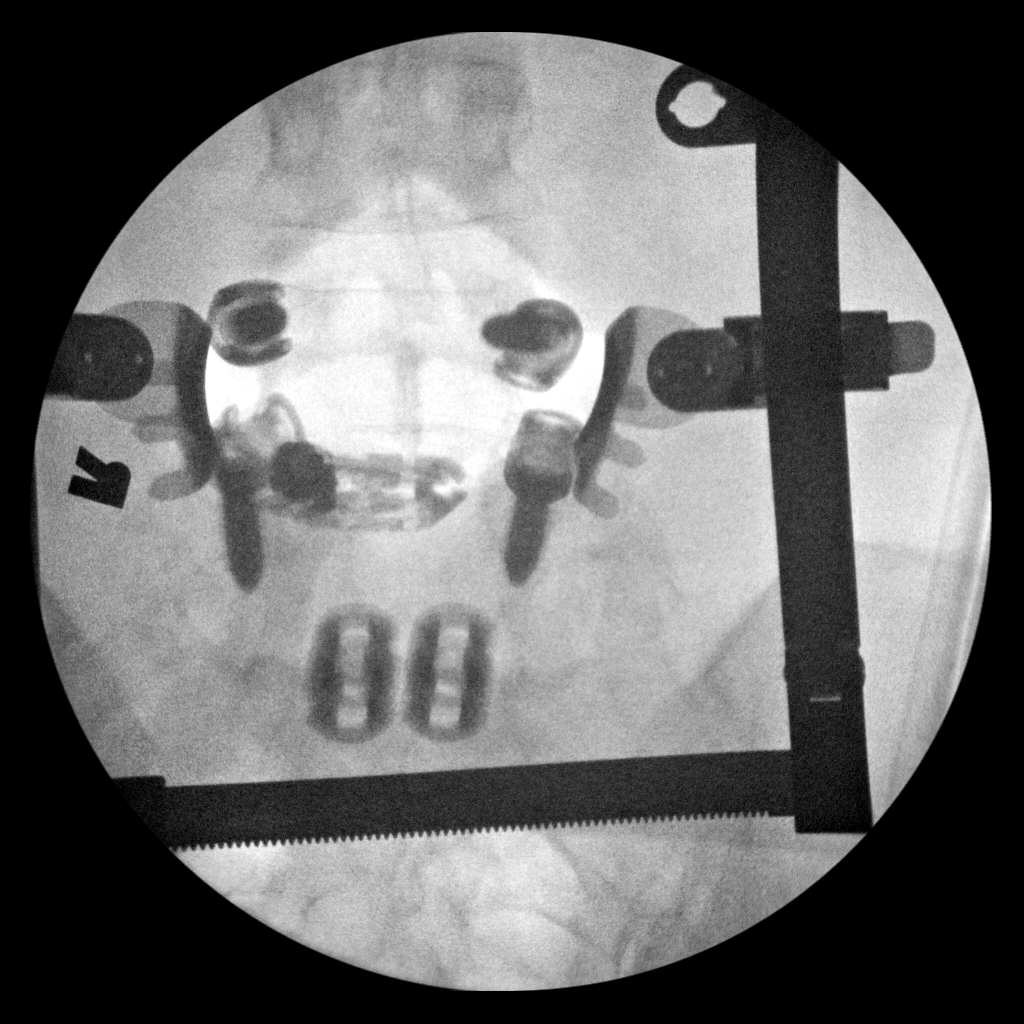

[3 of 3 positions shown; findings below may reference images not displayed]

FINDINGS: AP and lateral views. These demonstrate trans pedicle screw fixation
L4-5 with interbody fusion material. Prior L5-S1 fixation as well.
IMPRESSION: Intraoperative imaging.

## 2017-01-11 ENCOUNTER — Other Ambulatory Visit: Payer: Self-pay | Admitting: Family Medicine

## 2017-01-28 DIAGNOSIS — M791 Myalgia: Secondary | ICD-10-CM | POA: Diagnosis not present

## 2017-01-28 DIAGNOSIS — R51 Headache: Secondary | ICD-10-CM | POA: Diagnosis not present

## 2017-01-28 DIAGNOSIS — M542 Cervicalgia: Secondary | ICD-10-CM | POA: Diagnosis not present

## 2017-01-28 DIAGNOSIS — G43719 Chronic migraine without aura, intractable, without status migrainosus: Secondary | ICD-10-CM | POA: Diagnosis not present

## 2017-02-01 ENCOUNTER — Ambulatory Visit: Payer: Self-pay | Admitting: Family Medicine

## 2017-02-08 ENCOUNTER — Other Ambulatory Visit: Payer: Self-pay | Admitting: Cardiovascular Disease

## 2017-02-10 ENCOUNTER — Ambulatory Visit: Payer: Self-pay | Admitting: Family Medicine

## 2017-02-23 ENCOUNTER — Other Ambulatory Visit: Payer: Self-pay | Admitting: Psychiatry

## 2017-02-23 DIAGNOSIS — M791 Myalgia, unspecified site: Secondary | ICD-10-CM | POA: Diagnosis not present

## 2017-02-23 DIAGNOSIS — F331 Major depressive disorder, recurrent, moderate: Secondary | ICD-10-CM

## 2017-02-23 DIAGNOSIS — G518 Other disorders of facial nerve: Secondary | ICD-10-CM | POA: Diagnosis not present

## 2017-02-23 DIAGNOSIS — M542 Cervicalgia: Secondary | ICD-10-CM | POA: Diagnosis not present

## 2017-02-23 DIAGNOSIS — G43719 Chronic migraine without aura, intractable, without status migrainosus: Secondary | ICD-10-CM | POA: Diagnosis not present

## 2017-02-28 ENCOUNTER — Ambulatory Visit (INDEPENDENT_AMBULATORY_CARE_PROVIDER_SITE_OTHER): Payer: Medicare Other | Admitting: Family Medicine

## 2017-02-28 ENCOUNTER — Encounter: Payer: Self-pay | Admitting: Family Medicine

## 2017-02-28 VITALS — BP 132/84 | HR 74 | Temp 97.8°F | Wt 213.6 lb

## 2017-02-28 DIAGNOSIS — E7800 Pure hypercholesterolemia, unspecified: Secondary | ICD-10-CM | POA: Diagnosis not present

## 2017-02-28 DIAGNOSIS — Z1159 Encounter for screening for other viral diseases: Secondary | ICD-10-CM

## 2017-02-28 DIAGNOSIS — Z114 Encounter for screening for human immunodeficiency virus [HIV]: Secondary | ICD-10-CM

## 2017-02-28 DIAGNOSIS — Z1231 Encounter for screening mammogram for malignant neoplasm of breast: Secondary | ICD-10-CM | POA: Diagnosis not present

## 2017-02-28 DIAGNOSIS — Z1211 Encounter for screening for malignant neoplasm of colon: Secondary | ICD-10-CM

## 2017-02-28 DIAGNOSIS — Z23 Encounter for immunization: Secondary | ICD-10-CM | POA: Diagnosis not present

## 2017-02-28 DIAGNOSIS — I10 Essential (primary) hypertension: Secondary | ICD-10-CM

## 2017-02-28 DIAGNOSIS — E1100 Type 2 diabetes mellitus with hyperosmolarity without nonketotic hyperglycemic-hyperosmolar coma (NKHHC): Secondary | ICD-10-CM | POA: Diagnosis not present

## 2017-02-28 DIAGNOSIS — F331 Major depressive disorder, recurrent, moderate: Secondary | ICD-10-CM | POA: Diagnosis not present

## 2017-02-28 MED ORDER — VILAZODONE HCL 40 MG PO TABS: 40.0000 mg | ORAL_TABLET | Freq: Every day | ORAL | 3 refills | Status: DC

## 2017-02-28 NOTE — Progress Notes (Signed)
Patient: Abigail Figueroa Female    DOB: 02-14-1961   56 y.o.   MRN: [id number] Visit Date: 02/28/2017   Today's Provider: Vernie Murders, PA   Chief Complaint  Patient presents with  . Hypertension  . Diabetes  . Hyperlipidemia  . Depression  . Follow-up   Subjective:    HPI  Diabetes Mellitus Type II, Follow-up:   RecentLabs       Lab Results  Component Value Date   HGBA1C 8.6 02/02/2016   HGBA1C 9.2 (H) 07/21/2015   HGBA1C 9.3 (H) 01/10/2015     Last seen for diabetes 7 months ago.  Management since then includes encouraged to watch diet closer, walk for exercise and continue Metformin 500 mg 1 tablet at breakfast, 1 at lunch and 1 at supper. She reports fair compliance with treatment. Patient did not get labs done.  She is not having side effects.  Current symptoms include none  Home blood sugar records: fasting range: Patient has not checked FBS in approximately 1 week. Last check was 133.  Episodes of hypoglycemia? no              Current Insulin Regimen: none Weight trend: stable Current diet: low carb Current exercise: yes  ------------------------------------------------------------------------   Hypertension, follow-up:  BP Readings from Last 3 Encounters:  02/28/17 132/84  11/01/16 138/82  10/04/16 140/88    She was last seen for hypertension 7 months ago.  BP at that visit was 148/82 Management since that visit includes continue medications. She reports fair compliance with treatment. Patient did not get labs done.  She is not having side effects.  She is exercising. She is adherent to low salt diet.   Outside blood pressures are not being checked. She is experiencing none.  Patient denies chest pain, chest pressure/discomfort, irregular heart beat and palpitations.   Cardiovascular risk factors include diabetes mellitus, dyslipidemia and obesity (BMI >= 30 kg/m2).  Use of agents associated with hypertension: none.    ------------------------------------------------------------------------    Lipid/Cholesterol, Follow-up:   Last seen for this 7 months ago.  Management since that visit includes continue medications.  Last Lipid Panel: Labs(Brief)   Lab Results  Component Value Date   CHOL 289 (H) 01/10/2015   HDL 91 01/10/2015   LDLCALC 172 (H) 01/10/2015   TRIG 130 01/10/2015   CHOLHDL 3.2 01/10/2015    She reports fair compliance with treatment. Patient did not get labs done.  She is not having side effects.   Wt Readings from Last 3 Encounters:  02/28/17 213 lb 9.6 oz (96.9 kg)  11/01/16 213 lb (96.6 kg)  10/04/16 212 lb (96.2 kg)    ------------------------------------------------------------------------ Past Medical History:  Diagnosis Date  . Abnormal stress test    a. 10/2009: Normal myocardial perfusion imaging; b. 08/2015 MV: medium defect of mod severity in mid ant apical region w/ mild HK of the distal inf wall;  c. 08/2015 Cath: nl Cors, EF 60%.  . Anemia   . Anxiety   . Arthritis   . Bell's palsy   . Depression   . Edema   . Essential hypertension   . Frequent urination   . Frequent urination at night   . Headache    migraines, gets botox injections  . Heart palpitations June 2013   Event monitor showing sinus tachycardia, PACs with couplets and triplets.  Marland Kitchen Hx of echocardiogram    a. 10/2009 Echo: showed normal left ventricular function, mild left ventricular hypertrophy, and no  significant valve abnormalities.  . Hypercholesterolemia   . Migraine   . Morbid obesity ([place])   . Neuromuscular disorder ([place])   . OSA (obstructive sleep apnea)    has been on continuous positive airway pressure  . Seizure disorder ([place])   . Seizures ([place])   . Type II diabetes mellitus ([place])    Past Surgical History:  Procedure Laterality Date  . ABDOMINAL HYSTERECTOMY  1990   without BSO  . BACK SURGERY  1900s 2001   x2 with "cage put in"  . BILATERAL  SALPINGOOPHORECTOMY  1990  . CARDIAC CATHETERIZATION N/A 08/06/2015   Procedure: Left Heart Cath and Coronary Angiography;  Surgeon: Troy Sine, MD;  Location: [facility];  Service: Cardiovascular;  Laterality: N/A;  . CARPAL TUNNEL RELEASE Bilateral   . CHOLECYSTECTOMY  1980  . DORSAL COMPARTMENT RELEASE Left 10/25/2014   Procedure: LEFT FIRST  DORSAL COMPARTMENT RELEASE AND RADIAL TENOSYNOVECTOMY ;  Surgeon: Roseanne Kaufman, MD;  Location: [facility];  Service: Orthopedics;  Laterality: Left;  . HAND SURGERY    . KNEE SURGERY Right 2012   meniscus tear  . KNEE SURGERY  2012  . LAMINECTOMY  1995  . TUBAL LIGATION  1982  . WRIST SURGERY Right    Family History  Problem Relation Age of Onset  . Sarcoidosis Mother   . Seizures Mother   . Heart attack Mother   . Alzheimer's disease Maternal Grandmother   . Dementia Maternal Grandmother   . Hypertension Maternal Grandfather   . Diabetes Maternal Grandfather    Allergies  Allergen Reactions  . Morphine And Related Hives    Nausea and vomiting   . Oxycontin [Oxycodone Hcl] Swelling, Hives and Itching     Previous Medications   BACLOFEN (LIORESAL) 10 MG TABLET    Take 1 tablet (10 mg total) by mouth 3 (three) times daily.   BOTOX 100 UNITS SOLR INJECTION    Inject into the muscle every 3 (three) months.    CHLORPROMAZINE (THORAZINE) 25 MG TABLET    Take 25 mg by mouth 3 (three) times daily.   CYCLOBENZAPRINE HCL PO    Take 1 tablet by mouth daily as needed.   EZETIMIBE-SIMVASTATIN (VYTORIN) 10-40 MG TABLET    TAKE 1 TABLET BY MOUTH AT BEDTIME. PLEASE SCHEDULE APPOINTMENT FOR REFILLS.   FLUOCINONIDE OINTMENT (LIDEX) 0.05 %    APPLY 1 APPLICATION TOPICALLY 2 (TWO) TIMES DAILY.   FLUTICASONE (FLONASE) 50 MCG/ACT NASAL SPRAY    PLACE 2 SPRAYS INTO BOTH NOSTRILS DAILY.   FUROSEMIDE (LASIX) 20 MG TABLET    Take 1 tablet (20 mg total) by mouth daily as needed for fluid or edema.   GABAPENTIN (NEURONTIN) 300 MG  CAPSULE    Take 300 mg by mouth 3 (three) times daily.    GLUCOSE BLOOD TEST STRIP    Test fasting each morning and 2 hours before supper. Retest if having hypoglycemic symptoms.   LAMOTRIGINE (LAMICTAL) 100 MG TABLET    Take 100 mg by mouth 2 (two) times daily.   LANCETS (ONETOUCH ULTRASOFT) LANCETS    Test fasting each morning and 2 hours before supper. Retest if having hypoglycemic symptoms.   METFORMIN (GLUCOPHAGE) 500 MG TABLET    TAKE 1 TABLETS IN THE MORNING, 1 TABLET AT LUNCH TIME AND 1 TABLETS IN THE EVENING   METOPROLOL TARTRATE (LOPRESSOR) 50 MG TABLET    TAKE 1 AND 1/2 TABLETS BY MOUTH TWICE A DAY  TEGRETOL-XR 400 MG 12 HR TABLET    TAKE 1 TABLET BY MOUTH TWICE DAILY   TRIAMCINOLONE CREAM (KENALOG) 0.1 %    Apply 1 application topically 2 (two) times daily.   VILAZODONE HCL (VIIBRYD) 40 MG TABS    Take 1 tablet (40 mg total) by mouth daily.    Review of Systems  Constitutional: Positive for fatigue.  Respiratory: Negative.   Cardiovascular: Negative.   Gastrointestinal: Positive for nausea.  Endocrine: Negative.   Musculoskeletal: Negative.     Social History  Substance Use Topics  . Smoking status: Former Smoker    Types: Cigarettes    Quit date: 01/03/1983  . Smokeless tobacco: Never Used     Comment: quit in 1984  . Alcohol use No   Objective:   BP 132/84 (BP Location: Right Arm, Patient Position: Sitting, Cuff Size: Normal)   Pulse 74   Temp 97.8 F (36.6 C) (Oral)   Wt 213 lb 9.6 oz (96.9 kg)   LMP  (LMP Unknown)   SpO2 97%   BMI 37.84 kg/m   Physical Exam  Constitutional: She is oriented to person, place, and time. She appears well-developed and well-nourished. No distress.  HENT:  Head: Normocephalic and atraumatic.  Right Ear: Hearing and external ear normal.  Left Ear: Hearing and external ear normal.  Nose: Nose normal.  Eyes: Conjunctivae and lids are normal. Right eye exhibits no discharge. Left eye exhibits no discharge. No scleral icterus.   Neck: Neck supple.  Cardiovascular: Normal rate.   Pulmonary/Chest: Effort normal and breath sounds normal. No respiratory distress.  Abdominal: Soft. Bowel sounds are normal.  Musculoskeletal: Normal range of motion.  Neurological: She is alert and oriented to person, place, and time.  Skin: Skin is intact. No lesion and no rash noted.  Psychiatric: She has a normal mood and affect. Her speech is normal and behavior is normal. Thought content normal.   Diabetic Foot Exam - Simple   Simple Foot Form Diabetic Foot exam was performed with the following findings:  Yes 02/28/2017  9:36 AM  Visual Inspection No deformities, no ulcerations, no other skin breakdown bilaterally:  Yes Sensation Testing Intact to touch and monofilament testing bilaterally:  Yes Pulse Check Posterior Tibialis and Dorsalis pulse intact bilaterally:  Yes Comments       Assessment & Plan:     1. Essential hypertension Well controlled on the Metoprolol and occasional use of Lasix for edema (none today). No longer taking potassium supplement. Check CBC, TSH and CMP. Follow up pending reports. - CBC with Differential - Comprehensive metabolic panel - TSH  2. Type 2 diabetes mellitus with hyperosmolarity without coma, without long-term current use of insulin (HCC) Feels blood sugars stable (last FBS was 133 a week ago). No polyuria, polydipsia or vision changes. Encouraged to get ophthalmology recheck this year. Microalbumen was 50 mg/L in March this year. Refuses additional medications. Reduced her Metformin 500 mg to one tablet TID on her own because she felt sugars were dropping too much. Will recheck labs. - HgB A1c - CBC with Differential - Comprehensive metabolic panel - Lipid Profile  3. Hypercholesterolemia Trying to follow low fat renal diet that her husband is on due to nephropathy. Will check her labs and continue Vytorin 10-40 mg qd. Tolerating this medication without side effects. - Comprehensive  metabolic panel - TSH - Lipid Profile  4. Depression, major, recurrent, moderate (HCC) Feels stable on the Viibryd and Lamictal. Wants to be referred to a  psychiatrist in [place] instead of here. Will check labs and refill Viibryd. Schedule referral. - CBC with Differential - Comprehensive metabolic panel - Ambulatory referral to Psychiatry  5. Screening for HIV (human immunodeficiency virus) Reordered because she did not get tests done when last ordered. - HIV antibody  6. Need for hepatitis C screening test Reordered because she did not get tests done when last ordered. - Hepatitis C Antibody  7. Screening mammogram, encounter for - MM Digital Screening; Future  8. Screening for colon cancer Had a polyp removed in 2008 by Dr. Allen Norris. Due for - Ambulatory referral to Gastroenterology  9. Need for influenza vaccination - Flu Vaccine QUAD 6+ mos PF IM (Fluarix Quad PF)

## 2017-03-01 LAB — HEMOGLOBIN A1C
EAG (MMOL/L): 9.5 (calc)
HEMOGLOBIN A1C: 7.6 %{Hb} — AB (ref <5.7)
Mean Plasma Glucose: 171 (calc)

## 2017-03-01 LAB — CBC WITH DIFFERENTIAL/PLATELET
BASOS ABS: 39 {cells}/uL (ref 0–200)
Basophils Relative: 0.7 %
EOS ABS: 140 {cells}/uL (ref 15–500)
Eosinophils Relative: 2.5 %
HCT: 34.7 % — ABNORMAL LOW (ref 35.0–45.0)
Hemoglobin: 11.6 g/dL — ABNORMAL LOW (ref 11.7–15.5)
Lymphs Abs: 2022 cells/uL (ref 850–3900)
MCH: 27.6 pg (ref 27.0–33.0)
MCHC: 33.4 g/dL (ref 32.0–36.0)
MCV: 82.4 fL (ref 80.0–100.0)
MONOS PCT: 5.2 %
MPV: 10.6 fL (ref 7.5–12.5)
NEUTROS PCT: 55.5 %
Neutro Abs: 3108 cells/uL (ref 1500–7800)
PLATELETS: 250 10*3/uL (ref 140–400)
RBC: 4.21 10*6/uL (ref 3.80–5.10)
RDW: 14.8 % (ref 11.0–15.0)
TOTAL LYMPHOCYTE: 36.1 %
WBC: 5.6 10*3/uL (ref 3.8–10.8)
WBCMIX: 291 {cells}/uL (ref 200–950)

## 2017-03-01 LAB — COMPLETE METABOLIC PANEL WITH GFR
AG Ratio: 1.2 (calc) (ref 1.0–2.5)
ALBUMIN MSPROF: 4 g/dL (ref 3.6–5.1)
ALKALINE PHOSPHATASE (APISO): 83 U/L (ref 33–130)
ALT: 16 U/L (ref 6–29)
AST: 15 U/L (ref 10–35)
BUN: 23 mg/dL (ref 7–25)
CHLORIDE: 105 mmol/L (ref 98–110)
CO2: 27 mmol/L (ref 20–32)
Calcium: 10.2 mg/dL (ref 8.6–10.4)
Creat: 1.03 mg/dL (ref 0.50–1.05)
GFR, EST AFRICAN AMERICAN: 70 mL/min/{1.73_m2} (ref >=60)
GFR, Est Non African American: 61 mL/min/{1.73_m2} (ref >=60)
GLUCOSE: 167 mg/dL — AB (ref 65–99)
Globulin: 3.3 g/dL (calc) (ref 1.9–3.7)
POTASSIUM: 4.4 mmol/L (ref 3.5–5.3)
Sodium: 140 mmol/L (ref 135–146)
TOTAL PROTEIN: 7.3 g/dL (ref 6.1–8.1)
Total Bilirubin: 0.3 mg/dL (ref 0.2–1.2)

## 2017-03-01 LAB — LIPID PANEL
Cholesterol: 294 mg/dL — ABNORMAL HIGH (ref <200)
HDL: 70 mg/dL (ref >50)
LDL CHOLESTEROL (CALC): 190 mg/dL — AB
NON-HDL CHOLESTEROL (CALC): 224 mg/dL — AB (ref <130)
Total CHOL/HDL Ratio: 4.2 (calc) (ref <5.0)
Triglycerides: 168 mg/dL — ABNORMAL HIGH (ref <150)

## 2017-03-01 LAB — TSH: TSH: 1.45 m[IU]/L (ref 0.40–4.50)

## 2017-03-01 LAB — HEPATITIS C ANTIBODY
HEP C AB: NONREACTIVE
SIGNAL TO CUT-OFF: 0.01 (ref <1.00)

## 2017-03-01 LAB — HIV ANTIBODY (ROUTINE TESTING W REFLEX): HIV 1&2 Ab, 4th Generation: NONREACTIVE

## 2017-04-06 DIAGNOSIS — M542 Cervicalgia: Secondary | ICD-10-CM | POA: Diagnosis not present

## 2017-04-06 DIAGNOSIS — R51 Headache: Secondary | ICD-10-CM | POA: Diagnosis not present

## 2017-04-06 DIAGNOSIS — G43719 Chronic migraine without aura, intractable, without status migrainosus: Secondary | ICD-10-CM | POA: Diagnosis not present

## 2017-04-06 DIAGNOSIS — M791 Myalgia, unspecified site: Secondary | ICD-10-CM | POA: Diagnosis not present

## 2017-04-18 ENCOUNTER — Other Ambulatory Visit: Payer: Self-pay | Admitting: Family Medicine

## 2017-04-18 NOTE — Telephone Encounter (Signed)
Please review. Thanks!  

## 2017-04-18 NOTE — Telephone Encounter (Signed)
Pt contacted office for refill request on the following medications:  Vilazodone HCl (VIIBRYD) 40 MG TABS   [facility] [address].  CB#[phone]/MW  Pt states she the pharmacy will not give her the last Rx due to she is getting it too early.  Pt states she took her last pill last night.  She is not sure why she is short on having enough medication for the month/MW

## 2017-04-18 NOTE — Telephone Encounter (Signed)
Ask pharmacy to refill early for the holidays - especially if traveling.

## 2017-04-19 NOTE — Telephone Encounter (Signed)
[facility] Pharmacist states patient's insurance is denying medication coverage early. Patient advised. She states that her insurance company told her they would pay for early refill. Advised patient to get her insurance company to contact [facility] to make them aware. [facility] to [facility] per dennis okay to refill Vilazodone early.

## 2017-04-22 ENCOUNTER — Telehealth: Payer: Self-pay

## 2017-04-22 NOTE — Telephone Encounter (Signed)
Gastroenterology Pre-Procedure Review  Request Date:  Requesting Physician: Dr. Vicente Males  PATIENT REVIEW QUESTIONS: The patient responded to the following health history questions as indicated:    1. Are you having any GI issues? No  2. Do you have a personal history of Polyps? Yes, [place] 3. Do you have a family history of Colon Cancer or Polyps? No  4. Diabetes Mellitus? Yes  5. Joint replacements in the past 12 months? No  6. Major health problems in the past 3 months? No  7. Any artificial heart valves, MVP, or defibrillator? No     MEDICATIONS & ALLERGIES:    Patient reports the following regarding taking any anticoagulation/antiplatelet therapy:   Plavix, Coumadin, Eliquis, Xarelto, Lovenox, Pradaxa, Brilinta, or Effient? No  Aspirin? No   Patient confirms/reports the following medications:  Current Outpatient Medications  Medication Sig Dispense Refill  . baclofen (LIORESAL) 10 MG tablet Take 1 tablet (10 mg total) by mouth 3 (three) times daily. (Patient taking differently: Take 10 mg by mouth 2 (two) times daily as needed (Headaches). ) 30 each 0  . BOTOX 100 UNITS SOLR injection Inject into the muscle every 3 (three) months.     . chlorproMAZINE (THORAZINE) 25 MG tablet Take 25 mg by mouth 3 (three) times daily.    . CYCLOBENZAPRINE HCL PO Take 1 tablet by mouth daily as needed.    . ezetimibe-simvastatin (VYTORIN) 10-40 MG tablet TAKE 1 TABLET BY MOUTH AT BEDTIME. PLEASE SCHEDULE APPOINTMENT FOR REFILLS. 30 tablet 0  . fluocinonide ointment (LIDEX) 3.38 % APPLY 1 APPLICATION TOPICALLY 2 (TWO) TIMES DAILY. 30 g 1  . fluticasone (FLONASE) 50 MCG/ACT nasal spray PLACE 2 SPRAYS INTO BOTH NOSTRILS DAILY. 48 g 3  . furosemide (LASIX) 20 MG tablet Take 1 tablet (20 mg total) by mouth daily as needed for fluid or edema. 30 tablet 0  . gabapentin (NEURONTIN) 300 MG capsule Take 300 mg by mouth 3 (three) times daily.     Marland Kitchen glucose blood test strip Test fasting each morning and 2 hours  before supper. Retest if having hypoglycemic symptoms. 100 each 12  . lamoTRIgine (LAMICTAL) 100 MG tablet Take 100 mg by mouth 2 (two) times daily.    . Lancets (ONETOUCH ULTRASOFT) lancets Test fasting each morning and 2 hours before supper. Retest if having hypoglycemic symptoms. 100 each 12  . metFORMIN (GLUCOPHAGE) 500 MG tablet TAKE 2 TABLETS IN THE MORNING, 1 TABLET AT LUNCH TIME AND 2 TABLETS IN THE EVENING 450 tablet 1  . metoprolol tartrate (LOPRESSOR) 50 MG tablet TAKE 1 AND 1/2 TABLETS BY MOUTH TWICE A DAY 90 tablet 0  . potassium chloride (MICRO-K) 10 MEQ CR capsule Take 1 capsule (10 mEq total) by mouth daily as needed. 30 capsule 5  . TEGRETOL-XR 400 MG 12 hr tablet TAKE 1 TABLET BY MOUTH TWICE DAILY 60 tablet 4  . triamcinolone cream (KENALOG) 0.1 % Apply 1 application topically 2 (two) times daily.    . Vilazodone HCl (VIIBRYD) 40 MG TABS Take 1 tablet (40 mg total) by mouth daily. 30 tablet 3   No current facility-administered medications for this visit.     Patient confirms/reports the following allergies:  Allergies  Allergen Reactions  . Morphine And Related Hives    Nausea and vomiting   . Oxycontin [Oxycodone Hcl] Swelling, Hives and Itching    No orders of the defined types were placed in this encounter.   AUTHORIZATION INFORMATION Primary Insurance: 1D#: Group #:  Secondary  Insurance: 1D#: Group #:  SCHEDULE INFORMATION: Date: 05/20/17 Time: Location: [facility]

## 2017-04-27 ENCOUNTER — Other Ambulatory Visit: Payer: Self-pay

## 2017-04-27 DIAGNOSIS — Z86010 Personal history of colonic polyps: Secondary | ICD-10-CM

## 2017-05-02 ENCOUNTER — Ambulatory Visit (INDEPENDENT_AMBULATORY_CARE_PROVIDER_SITE_OTHER): Payer: Medicare Other | Admitting: Family Medicine

## 2017-05-02 ENCOUNTER — Other Ambulatory Visit: Payer: Self-pay | Admitting: Family Medicine

## 2017-05-02 ENCOUNTER — Encounter: Payer: Self-pay | Admitting: Family Medicine

## 2017-05-02 VITALS — BP 148/96 | HR 71 | Temp 98.2°F | Wt 218.8 lb

## 2017-05-02 DIAGNOSIS — L219 Seborrheic dermatitis, unspecified: Secondary | ICD-10-CM | POA: Diagnosis not present

## 2017-05-02 DIAGNOSIS — S46811A Strain of other muscles, fascia and tendons at shoulder and upper arm level, right arm, initial encounter: Secondary | ICD-10-CM

## 2017-05-02 MED ORDER — KETOCONAZOLE 2 % EX CREA: 1.0000 "application " | TOPICAL_CREAM | Freq: Every day | CUTANEOUS | 0 refills | Status: DC

## 2017-05-02 NOTE — Progress Notes (Signed)
Patient: Abigail Figueroa Female    DOB: 05-29-1960   56 y.o.   MRN: [id number] Visit Date: 05/02/2017  Today's Provider: Vernie Murders, PA   Chief Complaint  Patient presents with  . Back Pain   Subjective:    Back Pain  This is a new problem. Episode onset: Saturday. The problem occurs intermittently. The problem has been gradually improving since onset. Pain location: upper right near shoulder blade. Quality: dull ache. The symptoms are aggravated by position and sitting. Risk factors include obesity. She has tried muscle relaxant (baclofen) for the symptoms. The treatment provided moderate relief.   Past Medical History:  Diagnosis Date  . Abnormal stress test    a. 10/2009: Normal myocardial perfusion imaging; b. 08/2015 MV: medium defect of mod severity in mid ant apical region w/ mild HK of the distal inf wall;  c. 08/2015 Cath: nl Cors, EF 60%.  . Anemia   . Anxiety   . Arthritis   . Bell's palsy   . Depression   . Edema   . Essential hypertension   . Frequent urination   . Frequent urination at night   . Headache    migraines, gets botox injections  . Heart palpitations June 2013   Event monitor showing sinus tachycardia, PACs with couplets and triplets.  Marland Kitchen Hx of echocardiogram    a. 10/2009 Echo: showed normal left ventricular function, mild left ventricular hypertrophy, and no significant valve abnormalities.  . Hypercholesterolemia   . Migraine   . Morbid obesity ([place])   . Neuromuscular disorder ([place])   . OSA (obstructive sleep apnea)    has been on continuous positive airway pressure  . Seizure disorder ([place])   . Seizures ([place])   . Type II diabetes mellitus ([place])    Past Surgical History:  Procedure Laterality Date  . ABDOMINAL HYSTERECTOMY  1990   without BSO  . BACK SURGERY  1900s 2001   x2 with "cage put in"  . BILATERAL SALPINGOOPHORECTOMY  1990  . CARDIAC CATHETERIZATION N/A 08/06/2015   Procedure: Left Heart Cath and Coronary Angiography;   Surgeon: Troy Sine, MD;  Location: [facility];  Service: Cardiovascular;  Laterality: N/A;  . CARPAL TUNNEL RELEASE Bilateral   . CHOLECYSTECTOMY  1980  . DORSAL COMPARTMENT RELEASE Left 10/25/2014   Procedure: LEFT FIRST  DORSAL COMPARTMENT RELEASE AND RADIAL TENOSYNOVECTOMY ;  Surgeon: Roseanne Kaufman, MD;  Location: [facility];  Service: Orthopedics;  Laterality: Left;  . HAND SURGERY    . KNEE SURGERY Right 2012   meniscus tear  . KNEE SURGERY  2012  . LAMINECTOMY  1995  . TUBAL LIGATION  1982  . WRIST SURGERY Right    Family History  Problem Relation Age of Onset  . Sarcoidosis Mother   . Seizures Mother   . Heart attack Mother   . Alzheimer's disease Maternal Grandmother   . Dementia Maternal Grandmother   . Hypertension Maternal Grandfather   . Diabetes Maternal Grandfather    Allergies  Allergen Reactions  . Morphine And Related Hives    Nausea and vomiting   . Oxycontin [Oxycodone Hcl] Swelling, Hives and Itching    Current Outpatient Medications:  .  baclofen (LIORESAL) 10 MG tablet, Take 1 tablet (10 mg total) by mouth 3 (three) times daily. (Patient taking differently: Take 10 mg by mouth 2 (two) times daily as needed (Headaches). ), Disp: 30 each, Rfl: 0 .  BOTOX 100 UNITS  SOLR injection, Inject into the muscle every 3 (three) months. , Disp: , Rfl:  .  chlorproMAZINE (THORAZINE) 25 MG tablet, Take 25 mg by mouth 3 (three) times daily., Disp: , Rfl:  .  CYCLOBENZAPRINE HCL PO, Take 1 tablet by mouth daily as needed., Disp: , Rfl:  .  ezetimibe-simvastatin (VYTORIN) 10-40 MG tablet, TAKE 1 TABLET BY MOUTH AT BEDTIME. PLEASE SCHEDULE APPOINTMENT FOR REFILLS., Disp: 30 tablet, Rfl: 0 .  fluocinonide ointment (LIDEX) 0.10 %, APPLY 1 APPLICATION TOPICALLY 2 (TWO) TIMES DAILY., Disp: 30 g, Rfl: 1 .  fluticasone (FLONASE) 50 MCG/ACT nasal spray, PLACE 2 SPRAYS INTO BOTH NOSTRILS DAILY., Disp: 48 g, Rfl: 3 .  furosemide (LASIX) 20 MG tablet, Take  1 tablet (20 mg total) by mouth daily as needed for fluid or edema., Disp: 30 tablet, Rfl: 0 .  gabapentin (NEURONTIN) 300 MG capsule, Take 300 mg by mouth 3 (three) times daily. , Disp: , Rfl:  .  glucose blood test strip, Test fasting each morning and 2 hours before supper. Retest if having hypoglycemic symptoms., Disp: 100 each, Rfl: 12 .  lamoTRIgine (LAMICTAL) 100 MG tablet, Take 100 mg by mouth 2 (two) times daily., Disp: , Rfl:  .  Lancets (ONETOUCH ULTRASOFT) lancets, Test fasting each morning and 2 hours before supper. Retest if having hypoglycemic symptoms., Disp: 100 each, Rfl: 12 .  metFORMIN (GLUCOPHAGE) 500 MG tablet, TAKE 2 TABLETS IN THE MORNING, 1 TABLET AT LUNCH TIME AND 2 TABLETS IN THE EVENING, Disp: 450 tablet, Rfl: 1 .  metoprolol tartrate (LOPRESSOR) 50 MG tablet, TAKE 1 AND 1/2 TABLETS BY MOUTH TWICE A DAY, Disp: 90 tablet, Rfl: 0 .  potassium chloride (MICRO-K) 10 MEQ CR capsule, Take 1 capsule (10 mEq total) by mouth daily as needed., Disp: 30 capsule, Rfl: 5 .  TEGRETOL-XR 400 MG 12 hr tablet, TAKE 1 TABLET BY MOUTH TWICE DAILY, Disp: 60 tablet, Rfl: 4 .  triamcinolone cream (KENALOG) 0.1 %, Apply 1 application topically 2 (two) times daily., Disp: , Rfl:  .  Vilazodone HCl (VIIBRYD) 40 MG TABS, Take 1 tablet (40 mg total) by mouth daily., Disp: 30 tablet, Rfl: 3  Review of Systems  Constitutional: Negative.   Respiratory: Negative.   Cardiovascular: Negative.   Musculoskeletal: Positive for back pain.    Social History   Tobacco Use  . Smoking status: Former Smoker    Types: Cigarettes    Last attempt to quit: 01/03/1983    Years since quitting: 34.3  . Smokeless tobacco: Never Used  . Tobacco comment: quit in 1984  Substance Use Topics  . Alcohol use: No    Alcohol/week: 0.0 oz   Objective:   BP (!) 148/96 (BP Location: Right Arm, Patient Position: Sitting, Cuff Size: Normal)   Pulse 71   Temp 98.2 F (36.8 C) (Oral)   Wt 218 lb 12.8 oz (99.2 kg)   LMP   (LMP Unknown)   SpO2 96%   BMI 38.76 kg/m    Physical Exam  Constitutional: She is oriented to person, place, and time. She appears well-developed and well-nourished. No distress.  HENT:  Head: Normocephalic and atraumatic.  Right Ear: Hearing normal.  Left Ear: Hearing normal.  Nose: Nose normal.  Eyes: Conjunctivae and lids are normal. Right eye exhibits no discharge. Left eye exhibits no discharge. No scleral icterus.  Cardiovascular: Normal rate and regular rhythm.  Pulmonary/Chest: Effort normal and breath sounds normal. No respiratory distress.  Abdominal: Bowel sounds are normal.  Musculoskeletal: Normal range of motion. She exhibits tenderness.  Right trapezius muscle.  Neurological: She is alert and oriented to person, place, and time.  Skin: Skin is intact. No lesion and no rash noted.  Psychiatric: She has a normal mood and affect. Her speech is normal and behavior is normal. Thought content normal.      Assessment & Plan:     1. Strain of right trapezius muscle, initial encounter Onset over the past 2-3 days. No specific injury known. No skin rashes and tenderness in in the right trapezius to palpate. Recommend using Icy-Hot with Lidocaine in the muscle and limit lifting above head. Recheck prn.  2. Seborrheic dermatitis Intermittent flare of scaly oily skin on forehead. Requests refill of the Nizoral that was given to her by Dr. Retia Passe (dermatologist). Follow up with dermatologist if no better after use of Nizoral intermittently over the next few days. - ketoconazole (NIZORAL) 2 % cream; Apply 1 application topically daily.  Dispense: 15 g; Refill: [place], [place]  [facility]

## 2017-05-12 ENCOUNTER — Other Ambulatory Visit: Payer: Self-pay | Admitting: Family Medicine

## 2017-05-12 DIAGNOSIS — L219 Seborrheic dermatitis, unspecified: Secondary | ICD-10-CM

## 2017-05-12 MED ORDER — KETOCONAZOLE 2 % EX CREA: 1.0000 "application " | TOPICAL_CREAM | Freq: Every day | CUTANEOUS | 0 refills | Status: DC | PRN

## 2017-05-12 NOTE — Telephone Encounter (Signed)
[facility] fax[facility] refill request for the following medications:  ketoconazole (NIZORAL) 2 % cream   Last Rx: 05/02/17 LOV: 05/02/17 Please advise. Thanks TNP

## 2017-05-17 ENCOUNTER — Telehealth: Payer: Self-pay | Admitting: Gastroenterology

## 2017-05-17 NOTE — Telephone Encounter (Signed)
Patient called and she is sick and needs to r/s her procedure.

## 2017-05-20 ENCOUNTER — Telehealth: Payer: Self-pay

## 2017-05-20 ENCOUNTER — Ambulatory Visit: Admission: RE | Admit: 2017-05-20 | Payer: Medicare Other | Source: Ambulatory Visit | Admitting: Gastroenterology

## 2017-05-20 ENCOUNTER — Encounter: Admission: RE | Payer: Self-pay | Source: Ambulatory Visit

## 2017-05-20 SURGERY — COLONOSCOPY WITH PROPOFOL
Anesthesia: General

## 2017-05-23 NOTE — Telephone Encounter (Signed)
Opened in error

## 2017-05-25 DIAGNOSIS — G43719 Chronic migraine without aura, intractable, without status migrainosus: Secondary | ICD-10-CM | POA: Diagnosis not present

## 2017-05-25 DIAGNOSIS — G518 Other disorders of facial nerve: Secondary | ICD-10-CM | POA: Diagnosis not present

## 2017-05-25 DIAGNOSIS — M542 Cervicalgia: Secondary | ICD-10-CM | POA: Diagnosis not present

## 2017-05-25 DIAGNOSIS — M791 Myalgia, unspecified site: Secondary | ICD-10-CM | POA: Diagnosis not present

## 2017-06-07 ENCOUNTER — Ambulatory Visit: Admission: RE | Admit: 2017-06-07 | Payer: Medicare Other | Source: Ambulatory Visit | Admitting: Gastroenterology

## 2017-06-07 ENCOUNTER — Encounter: Admission: RE | Payer: Self-pay | Source: Ambulatory Visit

## 2017-06-07 SURGERY — COLONOSCOPY WITH PROPOFOL
Anesthesia: General

## 2017-06-10 ENCOUNTER — Other Ambulatory Visit: Payer: Self-pay | Admitting: Cardiovascular Disease

## 2017-06-13 ENCOUNTER — Other Ambulatory Visit: Payer: Self-pay | Admitting: Family Medicine

## 2017-06-13 ENCOUNTER — Other Ambulatory Visit: Payer: Self-pay | Admitting: Specialist

## 2017-06-13 DIAGNOSIS — F331 Major depressive disorder, recurrent, moderate: Secondary | ICD-10-CM

## 2017-06-13 DIAGNOSIS — R519 Headache, unspecified: Secondary | ICD-10-CM

## 2017-06-13 DIAGNOSIS — R51 Headache: Principal | ICD-10-CM

## 2017-06-13 NOTE — Telephone Encounter (Signed)
REFILL 

## 2017-06-18 ENCOUNTER — Ambulatory Visit (INDEPENDENT_AMBULATORY_CARE_PROVIDER_SITE_OTHER): Payer: Medicare Other | Admitting: Family Medicine

## 2017-06-18 ENCOUNTER — Encounter: Payer: Self-pay | Admitting: Family Medicine

## 2017-06-18 VITALS — BP 124/80 | HR 97 | Temp 98.4°F | Resp 16 | Wt 222.0 lb

## 2017-06-18 DIAGNOSIS — J309 Allergic rhinitis, unspecified: Secondary | ICD-10-CM | POA: Insufficient documentation

## 2017-06-18 DIAGNOSIS — J0140 Acute pansinusitis, unspecified: Secondary | ICD-10-CM

## 2017-06-18 DIAGNOSIS — J301 Allergic rhinitis due to pollen: Secondary | ICD-10-CM

## 2017-06-18 MED ORDER — AZITHROMYCIN 250 MG PO TABS: ORAL_TABLET | ORAL | 0 refills | Status: AC

## 2017-06-18 MED ORDER — PREDNISONE 10 MG PO TABS: ORAL_TABLET | ORAL | 0 refills | Status: AC

## 2017-06-18 MED ORDER — AZELASTINE HCL 0.1 % NA SOLN: 2.0000 | Freq: Two times a day (BID) | NASAL | 3 refills | Status: DC

## 2017-06-18 NOTE — Progress Notes (Signed)
Patient: Abigail Figueroa Female    DOB: 03/21/1961   57 y.o.   MRN: [id number] Visit Date: 06/18/2017  Today's Provider: Lelon Huh, MD   Chief Complaint  Patient presents with  . Allergic Rhinitis    Subjective:    HPI    Allergic Rhinitis: OWEN PAGNOTTA is here for evaluation of possible allergic rhinitis. Patient's symptoms include cough, headaches, itchy eyes, nasal congestion, postnasal drip, sinus pressure, sneezing, swelling of eyes and watery eyes. These symptoms are seasonal. Current triggers include exposure to blooming tree. The patient has been suffering from these symptoms for approximately 2 weeks. The patient has tried Xyzal, Flonase with poor relief of symptoms. Pt states her sx are worsening.Is having more fascial pain around sinuses and drainage is starting to turn dark yellow.   Allergies  Allergen Reactions  . Morphine And Related Hives    Nausea and vomiting   . Oxycontin [Oxycodone Hcl] Swelling, Hives and Itching     Current Outpatient Medications:  .  baclofen (LIORESAL) 10 MG tablet, Take 1 tablet (10 mg total) by mouth 3 (three) times daily. (Patient taking differently: Take 10 mg by mouth 2 (two) times daily as needed (Headaches). ), Disp: 30 each, Rfl: 0 .  BOTOX 100 UNITS SOLR injection, Inject into the muscle every 3 (three) months. , Disp: , Rfl:  .  chlorproMAZINE (THORAZINE) 25 MG tablet, Take 25 mg by mouth 3 (three) times daily., Disp: , Rfl:  .  CYCLOBENZAPRINE HCL PO, Take 1 tablet by mouth daily as needed., Disp: , Rfl:  .  ezetimibe-simvastatin (VYTORIN) 10-40 MG tablet, TAKE 1 TABLET BY MOUTH AT BEDTIME. PLEASE SCHEDULE APPOINTMENT FOR REFILLS., Disp: 30 tablet, Rfl: 0 .  fluocinonide ointment (LIDEX) 5.63 %, APPLY 1 APPLICATION TOPICALLY 2 (TWO) TIMES DAILY., Disp: 30 g, Rfl: 1 .  fluticasone (FLONASE) 50 MCG/ACT nasal spray, PLACE 2 SPRAYS INTO BOTH NOSTRILS DAILY., Disp: 48 g, Rfl: 3 .  furosemide (LASIX) 20 MG tablet, Take  1 tablet (20 mg total) by mouth daily as needed for fluid or edema., Disp: 30 tablet, Rfl: 0 .  glucose blood test strip, Test fasting each morning and 2 hours before supper. Retest if having hypoglycemic symptoms., Disp: 100 each, Rfl: 12 .  ketoconazole (NIZORAL) 2 % cream, Apply 1 application topically daily as needed for irritation., Disp: 15 g, Rfl: 0 .  lamoTRIgine (LAMICTAL) 100 MG tablet, Take 100 mg by mouth 2 (two) times daily., Disp: , Rfl:  .  Lancets (ONETOUCH ULTRASOFT) lancets, Test fasting each morning and 2 hours before supper. Retest if having hypoglycemic symptoms., Disp: 100 each, Rfl: 12 .  metFORMIN (GLUCOPHAGE) 500 MG tablet, TAKE 2 TABLETS IN THE MORNING, 1 TABLET AT LUNCH TIME AND 2 TABLETS IN THE EVENING, Disp: 450 tablet, Rfl: 1 .  metoprolol tartrate (LOPRESSOR) 50 MG tablet, Take 1.5 tablets (75 mg total) by mouth 2 (two) times daily. NEED OV., Disp: 90 tablet, Rfl: 0 .  potassium chloride (MICRO-K) 10 MEQ CR capsule, Take 1 capsule (10 mEq total) by mouth daily as needed., Disp: 30 capsule, Rfl: 5 .  TEGRETOL-XR 400 MG 12 hr tablet, TAKE 1 TABLET BY MOUTH TWICE DAILY, Disp: 60 tablet, Rfl: 4 .  triamcinolone cream (KENALOG) 0.1 %, Apply 1 application topically 2 (two) times daily., Disp: , Rfl:  .  VIIBRYD 40 MG TABS, TAKE 1 TABLET BY MOUTH EVERY DAY, Disp: 30 tablet, Rfl: 3  Review of Systems  Constitutional: Positive for fatigue. Negative for fever.  HENT: Positive for congestion, facial swelling, postnasal drip, sinus pressure, sinus pain and sneezing. Negative for rhinorrhea and sore throat.   Respiratory: Positive for cough. Negative for shortness of breath and wheezing.   Neurological: Positive for weakness.    Social History   Tobacco Use  . Smoking status: Former Smoker    Types: Cigarettes    Last attempt to quit: 01/03/1983    Years since quitting: 34.4  . Smokeless tobacco: Never Used  . Tobacco comment: quit in 1984  Substance Use Topics  . Alcohol  use: No    Alcohol/week: 0.0 oz   Objective:   BP 124/80 (BP Location: Right Arm, Patient Position: Sitting, Cuff Size: Large)   Pulse 97   Temp 98.4 F (36.9 C) (Oral)   Resp 16   Wt 222 lb (100.7 kg)   LMP  (LMP Unknown)   SpO2 95%   BMI 39.33 kg/m  Vitals:   06/18/17 0909  BP: 124/80  Pulse: 97  Resp: 16  Temp: 98.4 F (36.9 C)  TempSrc: Oral  SpO2: 95%  Weight: 222 lb (100.7 kg)     Physical Exam  General Appearance:    Alert, cooperative, no distress  HENT:   bilateral TM normal without fluid or infection, neck has bilateral anterior cervical nodes enlarged, pharynx erythematous without exudate, frontal and maxillary sinuses tender, post nasal drip noted and nasal mucosa pale and congested  Eyes:    PERRL, conjunctiva/corneas clear, EOM's intact       Lungs:     Clear to auscultation bilaterally, respirations unlabored  Heart:    Regular rate and rhythm  Neurologic:   Awake, alert, oriented x 3. No apparent focal neurological           defect.           Assessment & Plan:     1. Seasonal allergic rhinitis due to pollen  - predniSONE (DELTASONE) 10 MG tablet; 6 tablets for 1 day, then 5 for 1 day, then 4 for 1 day, then 3 for 1 day, then 2 for 1 day then 1 for 1 day.  Dispense: 21 tablet; Refill: 0 - azelastine (ASTELIN) 0.1 % nasal spray; Place 2 sprays into both nostrils 2 (two) times daily. Use in each nostril as directed  Dispense: 30 mL; Refill: 3  2. Acute non-recurrent pansinusitis  - predniSONE (DELTASONE) 10 MG tablet; 6 tablets for 1 day, then 5 for 1 day, then 4 for 1 day, then 3 for 1 day, then 2 for 1 day then 1 for 1 day.  Dispense: 21 tablet; Refill: 0 - azithromycin (ZITHROMAX) 250 MG tablet; 2 by mouth today, then 1 daily for 4 days  Dispense: 6 tablet; Refill: 0       Lelon Huh, MD  [facility]

## 2017-06-18 NOTE — Patient Instructions (Signed)

## 2017-06-22 ENCOUNTER — Telehealth: Payer: Self-pay

## 2017-06-22 NOTE — Telephone Encounter (Signed)
Called to schedule AWV with [facility] and pt was unavailable at this time and would like a CB tomorrow, 06/23/17. -MM

## 2017-06-23 NOTE — Telephone Encounter (Signed)
LMTCB and schedule AWV. -MM

## 2017-06-27 ENCOUNTER — Ambulatory Visit
Admission: RE | Admit: 2017-06-27 | Discharge: 2017-06-27 | Disposition: A | Discharge status: Home / Self Care | Payer: Medicare Other | Source: Ambulatory Visit | Attending: Specialist | Admitting: Specialist

## 2017-06-27 DIAGNOSIS — R51 Headache: Secondary | ICD-10-CM | POA: Diagnosis not present

## 2017-06-27 DIAGNOSIS — R519 Headache, unspecified: Secondary | ICD-10-CM

## 2017-06-29 NOTE — Telephone Encounter (Signed)
Scheduled AWV for 07/15/17 @ 10:00 AM.

## 2017-07-02 ENCOUNTER — Ambulatory Visit (INDEPENDENT_AMBULATORY_CARE_PROVIDER_SITE_OTHER): Payer: Medicare Other | Admitting: Family Medicine

## 2017-07-02 ENCOUNTER — Encounter: Payer: Self-pay | Admitting: Family Medicine

## 2017-07-02 VITALS — BP 122/78 | HR 72 | Temp 98.3°F | Resp 16 | Wt 221.0 lb

## 2017-07-02 DIAGNOSIS — J40 Bronchitis, not specified as acute or chronic: Secondary | ICD-10-CM | POA: Diagnosis not present

## 2017-07-02 DIAGNOSIS — J0100 Acute maxillary sinusitis, unspecified: Secondary | ICD-10-CM

## 2017-07-02 MED ORDER — HYDROCODONE-HOMATROPINE 5-1.5 MG/5ML PO SYRP: ORAL_SOLUTION | ORAL | 0 refills | Status: DC

## 2017-07-02 MED ORDER — AMOXICILLIN-POT CLAVULANATE 875-125 MG PO TABS: 1.0000 | ORAL_TABLET | Freq: Two times a day (BID) | ORAL | 0 refills | Status: DC

## 2017-07-02 NOTE — Progress Notes (Signed)
Subjective:     Patient ID: SARANN TREGRE, female   DOB: July 15, 1960, 57 y.o.   MRN: [id number] Chief Complaint  Patient presents with  . Cough    Patient is here today with c/o cough,fatigue,runny nose, ears feel like they are burning,headaches and congestion.Symptoms started 3 weeks. Reports that she was seen by Dr.Fisher on a Saturday and was given Prednisone,Zpack and nasal spray. Reports that she was feeling good for a couple a days. Reports no fever.    HPI States she is starting to have purulent sinus congestion again and her cough has been productive of yellow sputum for a week. No fever reported.Accompanied by her husband today.  Review of Systems     Objective:   Physical Exam  Constitutional: She appears well-developed and well-nourished. No distress.  Ears: T.M's intact without inflammation Sinuses: mild maxillary sinus tenderness Throat: no tonsillar enlargement or exudate Neck: no cervical adenopathy Lungs: clear     Assessment:    1. Bronchitis - HYDROcodone-homatropine (HYCODAN) 5-1.5 MG/5ML syrup; 5 ml 4-6 hours as needed for cough  Dispense: 120 mL; Refill: 0  2. Acute maxillary sinusitis, recurrence not specified - amoxicillin-clavulanate (AUGMENTIN) 875-125 MG tablet; Take 1 tablet by mouth 2 (two) times daily.  Dispense: 20 tablet; Refill: 0    Plan:    Discussed use of saline nasal spray and antihistamine like Claritin.

## 2017-07-02 NOTE — Patient Instructions (Signed)
Discussed use of saline nasal spray and a non-sedating antihistamine like Claritin

## 2017-07-06 DIAGNOSIS — M542 Cervicalgia: Secondary | ICD-10-CM | POA: Diagnosis not present

## 2017-07-06 DIAGNOSIS — M791 Myalgia, unspecified site: Secondary | ICD-10-CM | POA: Diagnosis not present

## 2017-07-06 DIAGNOSIS — G43719 Chronic migraine without aura, intractable, without status migrainosus: Secondary | ICD-10-CM | POA: Diagnosis not present

## 2017-07-06 DIAGNOSIS — R51 Headache: Secondary | ICD-10-CM | POA: Diagnosis not present

## 2017-07-15 ENCOUNTER — Encounter: Payer: Self-pay | Admitting: Family Medicine

## 2017-07-15 ENCOUNTER — Ambulatory Visit (INDEPENDENT_AMBULATORY_CARE_PROVIDER_SITE_OTHER): Payer: Medicare Other

## 2017-07-15 ENCOUNTER — Ambulatory Visit (INDEPENDENT_AMBULATORY_CARE_PROVIDER_SITE_OTHER): Payer: Medicare Other | Admitting: Family Medicine

## 2017-07-15 VITALS — BP 136/64 | HR 72 | Temp 98.9°F | Ht 63.0 in

## 2017-07-15 VITALS — BP 122/86 | HR 68 | Temp 97.5°F | Resp 16 | Wt 218.0 lb

## 2017-07-15 DIAGNOSIS — Z1211 Encounter for screening for malignant neoplasm of colon: Secondary | ICD-10-CM | POA: Diagnosis not present

## 2017-07-15 DIAGNOSIS — J301 Allergic rhinitis due to pollen: Secondary | ICD-10-CM

## 2017-07-15 DIAGNOSIS — Z0000 Encounter for general adult medical examination without abnormal findings: Secondary | ICD-10-CM | POA: Diagnosis not present

## 2017-07-15 NOTE — Patient Instructions (Signed)
Resume azelastine spray and fluticasone spray and continue Claritin. Consider another trial on oral prednisone if not improving in a week.

## 2017-07-15 NOTE — Patient Instructions (Addendum)
Abigail Figueroa , Thank you for taking time to come for your Medicare Wellness Visit. I appreciate your ongoing commitment to your health goals. Please review the following plan we discussed and let me know if I can assist you in the future.   Screening recommendations/referrals: Colonoscopy: Up to date Recommended yearly ophthalmology/optometry visit for glaucoma screening and checkup Recommended yearly dental visit for hygiene and checkup  Vaccinations: Influenza vaccine: Up to date Pneumococcal vaccine: N/A Tdap vaccine: Up to date Shingles vaccine: N/A    Advanced directives: Please bring a copy of your POA (Power of Attorney) and/or Living Will to your next appointment.   Conditions/risks identified: Recommend to continue current plan to cut out all sodas in diet to help aid in weight loss.   Next appointment: Advised pt to set up an CPE- pt declined. Next AWV scheduled for 07/18/18.  Preventive Care 40-64 Years, Female Preventive care refers to lifestyle choices and visits with your health care provider that can promote health and wellness. What does preventive care include?  A yearly physical exam. This is also called an annual well check.  Dental exams once or twice a year.  Routine eye exams. Ask your health care provider how often you should have your eyes checked.  Personal lifestyle choices, including:  Daily care of your teeth and gums.  Regular physical activity.  Eating a healthy diet.  Avoiding tobacco and drug use.  Limiting alcohol use.  Practicing safe sex.  Taking low-dose aspirin every day starting at age 65. What happens during an annual well check? The services and screenings done by your health care provider during your annual well check will depend on your age, overall health, lifestyle risk factors, and family history of disease. Counseling  Your health care provider may ask you questions about your:  Alcohol use.  Tobacco use.  Drug  use.  Emotional well-being.  Home and relationship well-being.  Sexual activity.  Eating habits.  Work and work Statistician. Screening  You may have the following tests or measurements:  Height, weight, and BMI.  Blood pressure.  Lipid and cholesterol levels. These may be checked every 5 years, or more frequently if you are over 28 years old.  Skin check.  Lung cancer screening. You may have this screening every year starting at age 50 if you have a 30-pack-year history of smoking and currently smoke or have quit within the past 15 years.  Fecal occult blood test (FOBT) of the stool. You may have this test every year starting at age 69.  Flexible sigmoidoscopy or colonoscopy. You may have a sigmoidoscopy every 5 years or a colonoscopy every 10 years starting at age 31.  Prostate cancer screening. Recommendations will vary depending on your family history and other risks.  Hepatitis C blood test.  Hepatitis B blood test.  Sexually transmitted disease (STD) testing.  Diabetes screening. This is done by checking your blood sugar (glucose) after you have not eaten for a while (fasting). You may have this done every 1-3 years. Discuss your test results, treatment options, and if necessary, the need for more tests with your health care provider. Vaccines  Your health care provider may recommend certain vaccines, such as:  Influenza vaccine. This is recommended every year.  Tetanus, diphtheria, and acellular pertussis (Tdap, Td) vaccine. You may need a Td booster every 10 years.  Zoster vaccine. You may need this after age 71.  Pneumococcal 13-valent conjugate (PCV13) vaccine. You may need this if you have  certain conditions and have not been vaccinated.  Pneumococcal polysaccharide (PPSV23) vaccine. You may need one or two doses if you smoke cigarettes or if you have certain conditions. Talk to your health care provider about which screenings and vaccines you need and how  often you need them. This information is not intended to replace advice given to you by your health care provider. Make sure you discuss any questions you have with your health care provider. Document Released: 05/16/2015 Document Revised: 01/07/2016 Document Reviewed: 02/18/2015 Elsevier Interactive Patient Education  2017 [employer] Prevention in the Home Falls can cause injuries. They can happen to people of all ages. There are many things you can do to make your home safe and to help prevent falls. What can I do on the outside of my home?  Regularly fix the edges of walkways and driveways and fix any cracks.  Remove anything that might make you trip as you walk through a door, such as a raised step or threshold.  Trim any bushes or trees on the path to your home.  Use bright outdoor lighting.  Clear any walking paths of anything that might make someone trip, such as rocks or tools.  Regularly check to see if handrails are loose or broken. Make sure that both sides of any steps have handrails.  Any raised decks and porches should have guardrails on the edges.  Have any leaves, snow, or ice cleared regularly.  Use sand or salt on walking paths during winter.  Clean up any spills in your garage right away. This includes oil or grease spills. What can I do in the bathroom?  Use night lights.  Install grab bars by the toilet and in the tub and shower. Do not use towel bars as grab bars.  Use non-skid mats or decals in the tub or shower.  If you need to sit down in the shower, use a plastic, non-slip stool.  Keep the floor dry. Clean up any water that spills on the floor as soon as it happens.  Remove soap buildup in the tub or shower regularly.  Attach bath mats securely with double-sided non-slip rug tape.  Do not have throw rugs and other things on the floor that can make you trip. What can I do in the bedroom?  Use night lights.  Make sure that you have a  light by your bed that is easy to reach.  Do not use any sheets or blankets that are too big for your bed. They should not hang down onto the floor.  Have a firm chair that has side arms. You can use this for support while you get dressed.  Do not have throw rugs and other things on the floor that can make you trip. What can I do in the kitchen?  Clean up any spills right away.  Avoid walking on wet floors.  Keep items that you use a lot in easy-to-reach places.  If you need to reach something above you, use a strong step stool that has a grab bar.  Keep electrical cords out of the way.  Do not use floor polish or wax that makes floors slippery. If you must use wax, use non-skid floor wax.  Do not have throw rugs and other things on the floor that can make you trip. What can I do with my stairs?  Do not leave any items on the stairs.  Make sure that there are handrails on both sides of  the stairs and use them. Fix handrails that are broken or loose. Make sure that handrails are as long as the stairways.  Check any carpeting to make sure that it is firmly attached to the stairs. Fix any carpet that is loose or worn.  Avoid having throw rugs at the top or bottom of the stairs. If you do have throw rugs, attach them to the floor with carpet tape.  Make sure that you have a light switch at the top of the stairs and the bottom of the stairs. If you do not have them, ask someone to add them for you. What else can I do to help prevent falls?  Wear shoes that:  Do not have high heels.  Have rubber bottoms.  Are comfortable and fit you well.  Are closed at the toe. Do not wear sandals.  If you use a stepladder:  Make sure that it is fully opened. Do not climb a closed stepladder.  Make sure that both sides of the stepladder are locked into place.  Ask someone to hold it for you, if possible.  Clearly mark and make sure that you can see:  Any grab bars or  handrails.  First and last steps.  Where the edge of each step is.  Use tools that help you move around (mobility aids) if they are needed. These include:  Canes.  Walkers.  Scooters.  Crutches.  Turn on the lights when you go into a dark area. Replace any light bulbs as soon as they burn out.  Set up your furniture so you have a clear path. Avoid moving your furniture around.  If any of your floors are uneven, fix them.  If there are any pets around you, be aware of where they are.  Review your medicines with your doctor. Some medicines can make you feel dizzy. This can increase your chance of falling. Ask your doctor what other things that you can do to help prevent falls. This information is not intended to replace advice given to you by your health care provider. Make sure you discuss any questions you have with your health care provider. Document Released: 02/13/2009 Document Revised: 09/25/2015 Document Reviewed: 05/24/2014 Elsevier Interactive Patient Education  2017 [employer].

## 2017-07-15 NOTE — Progress Notes (Addendum)
Subjective:     Patient ID: Abigail Figueroa, female   DOB: [birth year], 57 y.o.   MRN: [id number] Chief Complaint  Patient presents with  . Cough    Patient presents in office today with complaints of cough and congestion for 30days. Patient states associated with cough she has had sinus pain/pressure under eyes, fatigue and dark circles under eyes. Patient has tried otc [facility] brand allergy medicine and nasal spray.    HPI States sinus drainage improved after last course of abx. Now with allergy exacerbation. Only using Claritin at this time.Reports she has seen an allergist in the past and was getting shots.  Review of Systems     Objective:   Physical Exam  Constitutional: She appears well-developed and well-nourished. No distress.  Ears: T.M's intact without inflammation Sinuses: clear drainage noted today Throat: no tonsillar enlargement or exudate Neck: no cervical adenopathy Lungs: clear     Assessment:    1. Seasonal allergic rhinitis due to pollen     Plan:    Resume all allergy medication. Call if not improving in a week for prednisone trial.

## 2017-07-15 NOTE — Progress Notes (Addendum)
Subjective:   Abigail Figueroa is a 57 y.o. female who presents for Medicare Annual (Subsequent) preventive examination.  Review of Systems:  N/A       Objective:     Vitals: BP 136/64 (BP Location: Right Arm)   Pulse 72   Temp 98.9 F (37.2 C) (Oral)   Ht 5\' 3"  (1.6 m)   LMP  (LMP Unknown)   BMI 39.15 kg/m   Body mass index is 39.15 kg/m.  Advanced Directives 07/20/2016 01/23/2016 08/22/2015 08/06/2015 07/21/2015 04/06/2015 10/25/2014  Does Patient Have a Medical Advance Directive? Yes No Yes Yes Yes Yes Yes  Type of Advance Directive Living will;Healthcare Power of [place] will [place];Living will - - Living will  Does patient want to make changes to medical advance directive? - - No - Patient declined No - Patient declined - No - Patient declined No - Patient declined  Copy of [place] in Chart? No - copy requested - No - copy requested No - copy requested No - copy requested No - copy requested No - copy requested  Would patient like information on creating a medical advance directive? - Yes - Educational materials given - - - - -  Some encounter information is confidential and restricted. Go to Review Flowsheets activity to see all data.    Tobacco Social History   Tobacco Use  Smoking Status Former Smoker  . Types: Cigarettes  . Last attempt to quit: 01/03/1983  . Years since quitting: 34.5  Smokeless Tobacco Never Used  Tobacco Comment   quit in 1984     Counseling given: Not Answered Comment: quit in 1984   Clinical Intake:     Pain Score: 0-No pain                 Past Medical History:  Diagnosis Date  . Abnormal stress test    a. 10/2009: Normal myocardial perfusion imaging; b. 08/2015 MV: medium defect of mod severity in mid ant apical region w/ mild HK of the distal inf wall;  c. 08/2015 Cath: nl Cors, EF 60%.  . Anemia   . Anxiety   . Arthritis   . Bell's palsy   . Depression   . Edema   .  Essential hypertension   . Frequent urination   . Frequent urination at night   . Headache    migraines, gets botox injections  . Heart palpitations June 2013   Event monitor showing sinus tachycardia, PACs with couplets and triplets.  Marland Kitchen Hx of echocardiogram    a. 10/2009 Echo: showed normal left ventricular function, mild left ventricular hypertrophy, and no significant valve abnormalities.  . Hypercholesterolemia   . Migraine   . Morbid obesity ([place])   . Neuromuscular disorder ([place])   . OSA (obstructive sleep apnea)    has been on continuous positive airway pressure  . Seizure disorder ([place])   . Seizures ([place])   . Type II diabetes mellitus ([place])    Past Surgical History:  Procedure Laterality Date  . ABDOMINAL HYSTERECTOMY  1990   without BSO  . BACK SURGERY  1900s 2001   x2 with "cage put in"  . BILATERAL SALPINGOOPHORECTOMY  1990  . CARDIAC CATHETERIZATION N/A 08/06/2015   Procedure: Left Heart Cath and Coronary Angiography;  Surgeon: Troy Sine, MD;  Location: [facility];  Service: Cardiovascular;  Laterality: N/A;  . CARPAL TUNNEL RELEASE Bilateral   . CHOLECYSTECTOMY  [place] Left 10/25/2014   Procedure: LEFT FIRST  DORSAL COMPARTMENT RELEASE AND RADIAL TENOSYNOVECTOMY ;  Surgeon: Roseanne Kaufman, MD;  Location: [facility];  Service: Orthopedics;  Laterality: Left;  . HAND SURGERY    . KNEE SURGERY Right 2012   meniscus tear  . KNEE SURGERY  2012  . LAMINECTOMY  1995  . TUBAL LIGATION  1982  . WRIST SURGERY Right    Family History  Problem Relation Age of Onset  . Sarcoidosis Mother   . Seizures Mother   . Heart attack Mother   . Alzheimer's disease Maternal Grandmother   . Dementia Maternal Grandmother   . Hypertension Maternal Grandfather   . Diabetes Maternal Grandfather    Social History   Socioeconomic History  . Marital status: Married    Spouse name: None  . Number of children: None  . Years of  education: None  . Highest education level: None  Social Needs  . Financial resource strain: None  . Food insecurity - worry: None  . Food insecurity - inability: None  . Transportation needs - medical: None  . Transportation needs - non-medical: None  Occupational History  . None  Tobacco Use  . Smoking status: Former Smoker    Types: Cigarettes    Last attempt to quit: 01/03/1983    Years since quitting: 34.5  . Smokeless tobacco: Never Used  . Tobacco comment: quit in 1984  Substance and Sexual Activity  . Alcohol use: No    Alcohol/week: 0.0 oz  . Drug use: No  . Sexual activity: No  Other Topics Concern  . None  Social History Narrative   ** Merged History Encounter **       She is a married mother of 2, grandmother 3. She tries to get exercise but is not doing any routine program.   She quit smoking over 25 years ago does not drink alcohol.    Outpatient Encounter Medications as of 07/15/2017  Medication Sig  . amoxicillin-clavulanate (AUGMENTIN) 875-125 MG tablet Take 1 tablet by mouth 2 (two) times daily.  Marland Kitchen azelastine (ASTELIN) 0.1 % nasal spray Place 2 sprays into both nostrils 2 (two) times daily. Use in each nostril as directed  . baclofen (LIORESAL) 10 MG tablet Take 1 tablet (10 mg total) by mouth 3 (three) times daily. (Patient taking differently: Take 10 mg by mouth 2 (two) times daily as needed (Headaches). )  . BOTOX 100 UNITS SOLR injection Inject into the muscle every 3 (three) months.   . chlorproMAZINE (THORAZINE) 25 MG tablet Take 25 mg by mouth 3 (three) times daily.  . CYCLOBENZAPRINE HCL PO Take 1 tablet by mouth daily as needed.  . ezetimibe-simvastatin (VYTORIN) 10-40 MG tablet TAKE 1 TABLET BY MOUTH AT BEDTIME. PLEASE SCHEDULE APPOINTMENT FOR REFILLS.  . fluocinonide ointment (LIDEX) 8.52 % APPLY 1 APPLICATION TOPICALLY 2 (TWO) TIMES DAILY.  . fluticasone (FLONASE) 50 MCG/ACT nasal spray PLACE 2 SPRAYS INTO BOTH NOSTRILS DAILY.  . furosemide (LASIX)  20 MG tablet Take 1 tablet (20 mg total) by mouth daily as needed for fluid or edema.  Marland Kitchen glucose blood test strip Test fasting each morning and 2 hours before supper. Retest if having hypoglycemic symptoms.  Marland Kitchen HYDROcodone-homatropine (HYCODAN) 5-1.5 MG/5ML syrup 5 ml 4-6 hours as needed for cough  . ketoconazole (NIZORAL) 2 % cream Apply 1 application topically daily as needed for irritation.  Marland Kitchen lamoTRIgine (LAMICTAL) 100 MG tablet Take 100 mg  by mouth 2 (two) times daily.  . Lancets (ONETOUCH ULTRASOFT) lancets Test fasting each morning and 2 hours before supper. Retest if having hypoglycemic symptoms.  . metFORMIN (GLUCOPHAGE) 500 MG tablet TAKE 2 TABLETS IN THE MORNING, 1 TABLET AT LUNCH TIME AND 2 TABLETS IN THE EVENING  . metoprolol tartrate (LOPRESSOR) 50 MG tablet Take 1.5 tablets (75 mg total) by mouth 2 (two) times daily. NEED OV.  Marland Kitchen potassium chloride (MICRO-K) 10 MEQ CR capsule Take 1 capsule (10 mEq total) by mouth daily as needed. (Patient not taking: Reported on 07/02/2017)  . TEGRETOL-XR 400 MG 12 hr tablet TAKE 1 TABLET BY MOUTH TWICE DAILY  . triamcinolone cream (KENALOG) 0.1 % Apply 1 application topically 2 (two) times daily.  Marland Kitchen VIIBRYD 40 MG TABS TAKE 1 TABLET BY MOUTH EVERY DAY   No facility-administered encounter medications on file as of 07/15/2017.     Activities of Daily Living In your present state of health, do you have any difficulty performing the following activities: 02/28/2017 07/20/2016  Hearing? Tempie Donning  Vision? N N  Difficulty concentrating or making decisions? Y N  Walking or climbing stairs? N Y  Dressing or bathing? N N  Doing errands, shopping? Y N  Preparing Food and eating ? - N  Using the Toilet? - N  In the past six months, have you accidently leaked urine? - Y  Do you have problems with loss of bowel control? - N  Managing your Medications? - N  Managing your Finances? - N  Housekeeping or managing your Housekeeping? - N  Comment - husband and  daughter helps  Some recent data might be hidden    Patient Care Team: Chrismon, Vickki Muff, PA as PCP - General (Physician Assistant) Troy Sine, MD as Consulting Physician (Cardiology) Orie Rout, MD as Referring Physician (Specialist) Newman Pies, MD as Consulting Physician (Neurosurgery) Roseanne Kaufman, MD as Consulting Physician (Orthopedic Surgery)    Assessment:   This is a routine wellness examination for Davin.  Exercise Activities and Dietary recommendations    Goals    None      Fall Risk Fall Risk  02/28/2017 07/20/2016  Falls in the past year? No No  Risk for fall due to : - Impaired mobility   Is the patient's home free of loose throw rugs in walkways, pet beds, electrical cords, etc?   yes      Grab bars in the bathroom? yes      Handrails on the stairs?   no      Adequate lighting?   yes  Timed Get Up and Go performed: N/A  Depression Screen PHQ 2/9 Scores 02/28/2017 07/20/2016  PHQ - 2 Score 2 2  PHQ- 9 Score 4 7     Cognitive Function: Pt declined screening today.      6CIT Screen 07/20/2016  What Year? 0 points  What month? 0 points  What time? 0 points  Count back from 20 0 points  Months in reverse 0 points  Repeat phrase 10 points  Total Score 10    Immunization History  Administered Date(s) Administered  . Influenza,inj,Quad PF,6+ Mos 02/28/2017  . Tdap 10/20/2006, 10/20/2006    Qualifies for Shingles Vaccine? N/A  Screening Tests Health Maintenance  Topic Date Due  . OPHTHALMOLOGY EXAM  02/28/2018 (Originally 02/11/2017)  . COLONOSCOPY  02/28/2018 (Originally 11/22/2016)  . TETANUS/TDAP  02/28/2018 (Originally 10/19/2016)  . PNEUMOCOCCAL POLYSACCHARIDE VACCINE (1) 10/13/2025 (Originally 10/14/1962)  . PAP SMEAR  05/03/2026 (Originally 10/13/1981)  . URINE MICROALBUMIN  07/20/2017  . HEMOGLOBIN A1C  08/29/2017  . FOOT EXAM  02/28/2018  . MAMMOGRAM  02/19/2019  . INFLUENZA VACCINE  Completed  . Hepatitis C  Screening  Completed  . HIV Screening  Completed    Cancer Screenings: Lung: Low Dose CT Chest recommended if Age 72-80 years, 30 pack-year currently smoking OR have quit w/in 15years. Patient does not qualify. Breast:  Up to date on Mammogram? Yes   Up to date of Bone Density/Dexa? Yes Colorectal: Referral sent today.   Additional Screenings:  HIV: Up to date Hepatitis C Screening: Up to date     Plan:  I have personally reviewed and addressed the Medicare Annual Wellness questionnaire and have noted the following in the patient's chart:  A. Medical and social history B. Use of alcohol, tobacco or illicit drugs  C. Current medications and supplements D. Functional ability and status E.  Nutritional status F.  Physical activity G. Advance directives H. List of other physicians I.  Hospitalizations, surgeries, and ER visits in previous 12 months J.  [place] such as hearing and vision if needed, cognitive and depression L. Referrals and appointments - none  In addition, I have reviewed and discussed with patient certain preventive protocols, quality metrics, and best practice recommendations. A written personalized care plan for preventive services as well as general preventive health recommendations were provided to patient.  See attached scanned questionnaire for additional information.   Signed,  Fabio Neighbors, LPN Nurse Health Advisor   Nurse Recommendations: Pt would like a Pap smear at her next OV. Referral for colonoscopy sent today. Called [facility] to see when last eye exam was and they stated she was last seen 02/2016.   Reviewed documentation, screening and recommendations of Nurse Health Advisor. Was available for consultation and agree with recommendations and plan.

## 2017-07-18 ENCOUNTER — Other Ambulatory Visit: Payer: Self-pay

## 2017-07-18 ENCOUNTER — Other Ambulatory Visit: Payer: Self-pay | Admitting: Family Medicine

## 2017-07-18 ENCOUNTER — Other Ambulatory Visit: Payer: Self-pay | Admitting: Cardiovascular Disease

## 2017-07-18 ENCOUNTER — Telehealth: Payer: Self-pay

## 2017-07-18 DIAGNOSIS — L309 Dermatitis, unspecified: Secondary | ICD-10-CM

## 2017-07-18 DIAGNOSIS — Z1211 Encounter for screening for malignant neoplasm of colon: Secondary | ICD-10-CM

## 2017-07-18 NOTE — Telephone Encounter (Signed)
Gastroenterology Pre-Procedure Review  Request Date: 08/03/17 Requesting Physician: Dr. Bonna Gains   PATIENT REVIEW QUESTIONS: The patient responded to the following health history questions as indicated:    1. Are you having any GI issues? No  2. Do you have a personal history of Polyps? Yes  3. Do you have a family history of Colon Cancer or Polyps? No  4. Diabetes Mellitus? Yes  5. Joint replacements in the past 12 months? No  6. Major health problems in the past 3 months? No  7. Any artificial heart valves, MVP, or defibrillator? No     MEDICATIONS & ALLERGIES:    Patient reports the following regarding taking any anticoagulation/antiplatelet therapy:   Plavix, Coumadin, Eliquis, Xarelto, Lovenox, Pradaxa, Brilinta, or Effient? No  Aspirin? No   Patient confirms/reports the following medications:  Current Outpatient Medications  Medication Sig Dispense Refill  . azelastine (ASTELIN) 0.1 % nasal spray Place 2 sprays into both nostrils 2 (two) times daily. Use in each nostril as directed (Patient not taking: Reported on 07/15/2017) 30 mL 3  . baclofen (LIORESAL) 10 MG tablet Take 1 tablet (10 mg total) by mouth 3 (three) times daily. (Patient taking differently: Take 10 mg by mouth 2 (two) times daily as needed (Headaches). ) 30 each 0  . BOTOX 100 UNITS SOLR injection Inject into the muscle every 3 (three) months.     . chlorproMAZINE (THORAZINE) 25 MG tablet Take 25 mg by mouth 3 (three) times daily as needed.     . CYCLOBENZAPRINE HCL PO Take 1 tablet by mouth daily as needed.    . ezetimibe-simvastatin (VYTORIN) 10-40 MG tablet TAKE 1 TABLET BY MOUTH AT BEDTIME. PLEASE SCHEDULE APPOINTMENT FOR REFILLS. 30 tablet 0  . fluocinonide ointment (LIDEX) 0.53 % APPLY 1 APPLICATION TOPICALLY 2 (TWO) TIMES DAILY. (Patient not taking: Reported on 07/15/2017) 30 g 1  . fluticasone (FLONASE) 50 MCG/ACT nasal spray PLACE 2 SPRAYS INTO BOTH NOSTRILS DAILY. (Patient not taking: Reported on 07/15/2017)  48 g 3  . furosemide (LASIX) 20 MG tablet Take 1 tablet (20 mg total) by mouth daily as needed for fluid or edema. 30 tablet 0  . glucose blood test strip Test fasting each morning and 2 hours before supper. Retest if having hypoglycemic symptoms. 100 each 12  . HYDROcodone-homatropine (HYCODAN) 5-1.5 MG/5ML syrup 5 ml 4-6 hours as needed for cough (Patient not taking: Reported on 07/15/2017) 120 mL 0  . ketoconazole (NIZORAL) 2 % cream Apply 1 application topically daily as needed for irritation. (Patient not taking: Reported on 07/15/2017) 15 g 0  . lamoTRIgine (LAMICTAL) 100 MG tablet Take 100 mg by mouth 2 (two) times daily.    . Lancets (ONETOUCH ULTRASOFT) lancets Test fasting each morning and 2 hours before supper. Retest if having hypoglycemic symptoms. 100 each 12  . metFORMIN (GLUCOPHAGE) 500 MG tablet TAKE 2 TABLETS IN THE MORNING, 1 TABLET AT LUNCH TIME AND 2 TABLETS IN THE EVENING 450 tablet 1  . metoprolol tartrate (LOPRESSOR) 50 MG tablet Take 1.5 tablets (75 mg total) by mouth 2 (two) times daily. NEED OV. 90 tablet 0  . potassium chloride (MICRO-K) 10 MEQ CR capsule Take 1 capsule (10 mEq total) by mouth daily as needed. (Patient taking differently: Take 10 mEq by mouth daily. ) 30 capsule 5  . TEGRETOL-XR 400 MG 12 hr tablet TAKE 1 TABLET BY MOUTH TWICE DAILY 60 tablet 4  . triamcinolone cream (KENALOG) 0.1 % Apply 1 application topically 2 (two) times  daily.    Marland Kitchen VIIBRYD 40 MG TABS TAKE 1 TABLET BY MOUTH EVERY DAY 30 tablet 3   No current facility-administered medications for this visit.     Patient confirms/reports the following allergies:  Allergies  Allergen Reactions  . Morphine And Related Hives    Nausea and vomiting   . Oxycontin [Oxycodone Hcl] Swelling, Hives and Itching    No orders of the defined types were placed in this encounter.   AUTHORIZATION INFORMATION Primary Insurance: 1D#: Group #:  Secondary Insurance: 1D#: Group #:  SCHEDULE  INFORMATION: Date: 08/03/17    Time: Location: [place]

## 2017-07-23 DIAGNOSIS — Z0100 Encounter for examination of eyes and vision without abnormal findings: Secondary | ICD-10-CM | POA: Diagnosis not present

## 2017-07-23 DIAGNOSIS — E119 Type 2 diabetes mellitus without complications: Secondary | ICD-10-CM | POA: Diagnosis not present

## 2017-07-25 ENCOUNTER — Encounter: Payer: Self-pay | Admitting: Family Medicine

## 2017-07-25 ENCOUNTER — Ambulatory Visit (INDEPENDENT_AMBULATORY_CARE_PROVIDER_SITE_OTHER): Payer: Medicare Other | Admitting: Family Medicine

## 2017-07-25 ENCOUNTER — Ambulatory Visit: Payer: Medicare Other | Admitting: Family Medicine

## 2017-07-25 VITALS — BP 148/78 | HR 78 | Temp 98.6°F | Wt 218.4 lb

## 2017-07-25 DIAGNOSIS — N6313 Unspecified lump in the right breast, lower outer quadrant: Secondary | ICD-10-CM | POA: Diagnosis not present

## 2017-07-25 NOTE — Progress Notes (Unsigned)
Patient: Abigail Figueroa Female    DOB: 01/28/1961   57 y.o.   MRN: [id number] Visit Date: 07/25/2017  Today's Provider: Vernie Murders, PA   No chief complaint on file.  Subjective:    HPI Patient reports she feels "something" in her breast.     Allergies  Allergen Reactions  . Morphine And Related Hives    Nausea and vomiting   . Oxycontin [Oxycodone Hcl] Swelling, Hives and Itching     Current Outpatient Medications:  .  azelastine (ASTELIN) 0.1 % nasal spray, Place 2 sprays into both nostrils 2 (two) times daily. Use in each nostril as directed (Patient not taking: Reported on 07/15/2017), Disp: 30 mL, Rfl: 3 .  baclofen (LIORESAL) 10 MG tablet, Take 1 tablet (10 mg total) by mouth 3 (three) times daily. (Patient taking differently: Take 10 mg by mouth 2 (two) times daily as needed (Headaches). ), Disp: 30 each, Rfl: 0 .  BOTOX 100 UNITS SOLR injection, Inject into the muscle every 3 (three) months. , Disp: , Rfl:  .  chlorproMAZINE (THORAZINE) 25 MG tablet, Take 25 mg by mouth 3 (three) times daily as needed. , Disp: , Rfl:  .  CYCLOBENZAPRINE HCL PO, Take 1 tablet by mouth daily as needed., Disp: , Rfl:  .  ezetimibe-simvastatin (VYTORIN) 10-40 MG tablet, TAKE 1 TABLET BY MOUTH AT BEDTIME. PLEASE SCHEDULE APPOINTMENT FOR REFILLS., Disp: 30 tablet, Rfl: 0 .  fluocinonide ointment (LIDEX) 1.96 %, APPLY 1 APPLICATION TOPICALLY 2 (TWO) TIMES DAILY., Disp: 30 g, Rfl: 1 .  fluticasone (FLONASE) 50 MCG/ACT nasal spray, PLACE 2 SPRAYS INTO BOTH NOSTRILS DAILY. (Patient not taking: Reported on 07/15/2017), Disp: 48 g, Rfl: 3 .  furosemide (LASIX) 20 MG tablet, Take 1 tablet (20 mg total) by mouth daily as needed for fluid or edema., Disp: 30 tablet, Rfl: 0 .  glucose blood test strip, Test fasting each morning and 2 hours before supper. Retest if having hypoglycemic symptoms., Disp: 100 each, Rfl: 12 .  HYDROcodone-homatropine (HYCODAN) 5-1.5 MG/5ML syrup, 5 ml 4-6 hours as  needed for cough (Patient not taking: Reported on 07/15/2017), Disp: 120 mL, Rfl: 0 .  ketoconazole (NIZORAL) 2 % cream, Apply 1 application topically daily as needed for irritation. (Patient not taking: Reported on 07/15/2017), Disp: 15 g, Rfl: 0 .  lamoTRIgine (LAMICTAL) 100 MG tablet, Take 100 mg by mouth 2 (two) times daily., Disp: , Rfl:  .  Lancets (ONETOUCH ULTRASOFT) lancets, Test fasting each morning and 2 hours before supper. Retest if having hypoglycemic symptoms., Disp: 100 each, Rfl: 12 .  metFORMIN (GLUCOPHAGE) 500 MG tablet, TAKE 2 TABLETS IN THE MORNING, 1 TABLET AT LUNCH TIME AND 2 TABLETS IN THE EVENING, Disp: 450 tablet, Rfl: 1 .  metoprolol tartrate (LOPRESSOR) 50 MG tablet, TAKE 1.5 TABLETS (75 MG TOTAL) BY MOUTH 2 (TWO) TIMES DAILY. NEED OV., Disp: 90 tablet, Rfl: 0 .  potassium chloride (MICRO-K) 10 MEQ CR capsule, Take 1 capsule (10 mEq total) by mouth daily as needed. (Patient taking differently: Take 10 mEq by mouth daily. ), Disp: 30 capsule, Rfl: 5 .  TEGRETOL-XR 400 MG 12 hr tablet, TAKE 1 TABLET BY MOUTH TWICE DAILY, Disp: 60 tablet, Rfl: 4 .  triamcinolone cream (KENALOG) 0.1 %, Apply 1 application topically 2 (two) times daily., Disp: , Rfl:  .  VIIBRYD 40 MG TABS, TAKE 1 TABLET BY MOUTH EVERY DAY, Disp: 30 tablet, Rfl: 3  Review of Systems  Constitutional: Negative.   Respiratory: Negative.   Cardiovascular: Negative.     Social History   Tobacco Use  . Smoking status: Former Smoker    Types: Cigarettes    Last attempt to quit: 01/03/1983    Years since quitting: 34.5  . Smokeless tobacco: Never Used  . Tobacco comment: quit in 1984  Substance Use Topics  . Alcohol use: No    Alcohol/week: 0.0 oz   Objective:   LMP  (LMP Unknown)  There were no vitals filed for this visit.   Physical Exam      Assessment & Plan:           Vernie Murders, PA  [facility]

## 2017-07-25 NOTE — Progress Notes (Signed)
Patient: Abigail Figueroa Female    DOB: 08/09/1960   57 y.o.   MRN: [id number] Visit Date: 07/25/2017  Today's Provider: Vernie Murders, PA   Chief Complaint  Patient presents with  . breast lump   Subjective:    HPI Patient reports noticing a lump in right breast last week. Patient reports when she is laying down a certain way the area is painful. She denies any swelling, but states the area is discolored.   Past Medical History:  Diagnosis Date  . Abnormal stress test    a. 10/2009: Normal myocardial perfusion imaging; b. 08/2015 MV: medium defect of mod severity in mid ant apical region w/ mild HK of the distal inf wall;  c. 08/2015 Cath: nl Cors, EF 60%.  . Anemia   . Anxiety   . Arthritis   . Bell's palsy   . Depression   . Edema   . Essential hypertension   . Frequent urination   . Frequent urination at night   . Headache    migraines, gets botox injections  . Heart palpitations June 2013   Event monitor showing sinus tachycardia, PACs with couplets and triplets.  Marland Kitchen Hx of echocardiogram    a. 10/2009 Echo: showed normal left ventricular function, mild left ventricular hypertrophy, and no significant valve abnormalities.  . Hypercholesterolemia   . Migraine   . Morbid obesity ([place])   . Neuromuscular disorder ([place])   . OSA (obstructive sleep apnea)    has been on continuous positive airway pressure  . Seizure disorder ([place])   . Seizures ([place])   . Type II diabetes mellitus [facility])    Patient Active Problem List   Diagnosis Date Noted  . Rhinitis, allergic 06/18/2017  . Right-sided Bell's palsy 02/02/2016  . Spondylolisthesis of lumbar region 08/25/2015  . Type II diabetes mellitus ([place])   . Abnormal stress test   . Hypercholesterolemia   . Abnormal nuclear stress test 07/30/2015  . Preoperative clearance 07/22/2015  . Lower extremity edema 07/22/2015  . History of brain disorder 11/05/2014  . H/O: obesity 11/05/2014  . Depression, major, recurrent,  moderate ([place]) 11/05/2014  . Difficulty with family 11/05/2014  . Feeling stressed out 11/05/2014  . H/O: HTN (hypertension) 11/05/2014  . H/O elevated lipids 11/05/2014  . Anxiety, generalized 11/05/2014  . Migraine headache 11/05/2014  . Moderate recurrent major depression ([place]) 10/16/2014  . Generalized anxiety disorder 10/16/2014  . CHF (congestive heart failure) ([place]) 10/14/2014  . Edema 10/14/2014  . Depressive disorder 10/14/2014  . Obstructive sleep apnea 10/14/2014  . Diabetes ([place]) 10/14/2014  . Essential hypertension 10/14/2014  . Epilepsy ([place]) 10/14/2014  . Migraine 10/14/2014  . Hyperlipemia 10/14/2014  . Dysthymic disorder 10/14/2014  . Seizure ([place]) 10/14/2014  . Hypercholesteremia 10/14/2014  . Palpitation 10/14/2014  . Depression 10/14/2014  . Dyspepsia 10/14/2014  . Hematochezia 10/14/2014  . Urge incontinence 10/14/2014  . Hair thinning 10/14/2014  . Dermatitis, eczematoid 09/10/2014  . Acid indigestion 09/10/2014  . Alopecia 09/10/2014  . Blood in feces 09/10/2014  . H/O hypercholesterolemia 09/10/2014  . Arthralgia of hip 09/10/2014  . Feeling bilious 09/10/2014  . Awareness of heartbeats 09/10/2014  . Pain in the wrist 09/10/2014  . Convulsions ([place]) 09/10/2014  . Urge incontinence 09/10/2014  . Snapping thumb syndrome 04/19/2014  . Diabetes mellitus ([place]) 03/14/2014  . De Quervain's disease (radial styloid tenosynovitis) 03/08/2014  . LBP (low back pain) 03/07/2013  . Mixed hyperlipidemia 01/15/2013  .  Encounter for long-term (current) use of other medications - statin 01/15/2013  . Fatigue 01/15/2013  . Heart palpation 01/15/2013  . Obesity (BMI 30-39.9) 01/02/2013  . CCF (congestive cardiac failure) ([place]) 11/13/2009  . Malaise and fatigue 02/17/2009  . Sprain and strain of interphalangeal (joint) of hand 10/23/2007  . HEADACHE 06/23/2007  . Diabetes mellitus type 2 in obese ([place]) 05/08/2007  . HYPERCALCEMIA 05/08/2007  . Sleep apnea  05/08/2007  . Edema 05/08/2007  . DIARRHEA, CHRONIC 05/08/2007  . Clinical depression 10/20/2006  . Obstructive apnea 03/22/2006  . Diabetes mellitus, type 2 ([place]) 11/12/2005  . Depression, neurotic 11/12/2005  . Epilepsy ([place]) 11/12/2005  . Combined fat and carbohydrate induced hyperlipemia 11/12/2005  . Acute onset aura migraine 11/12/2005   Past Surgical History:  Procedure Laterality Date  . ABDOMINAL HYSTERECTOMY  1990   without BSO  . BACK SURGERY  1900s 2001   x2 with "cage put in"  . BILATERAL SALPINGOOPHORECTOMY  1990  . CARDIAC CATHETERIZATION N/A 08/06/2015   Procedure: Left Heart Cath and Coronary Angiography;  Surgeon: Troy Sine, MD;  Location: [facility];  Service: Cardiovascular;  Laterality: N/A;  . CARPAL TUNNEL RELEASE Bilateral   . CHOLECYSTECTOMY  1980  . DORSAL COMPARTMENT RELEASE Left 10/25/2014   Procedure: LEFT FIRST  DORSAL COMPARTMENT RELEASE AND RADIAL TENOSYNOVECTOMY ;  Surgeon: Roseanne Kaufman, MD;  Location: [facility];  Service: Orthopedics;  Laterality: Left;  . HAND SURGERY    . KNEE SURGERY Right 2012   meniscus tear  . KNEE SURGERY  2012  . LAMINECTOMY  1995  . TUBAL LIGATION  1982  . WRIST SURGERY Right    Family History  Problem Relation Age of Onset  . Sarcoidosis Mother   . Seizures Mother   . Heart attack Mother   . Alzheimer's disease Maternal Grandmother   . Dementia Maternal Grandmother   . Hypertension Maternal Grandfather   . Diabetes Maternal Grandfather    Allergies  Allergen Reactions  . Morphine And Related Hives    Nausea and vomiting   . Oxycontin [Oxycodone Hcl] Swelling, Hives and Itching    Current Outpatient Medications:  .  baclofen (LIORESAL) 10 MG tablet, Take 1 tablet (10 mg total) by mouth 3 (three) times daily. (Patient taking differently: Take 10 mg by mouth 2 (two) times daily as needed (Headaches). ), Disp: 30 each, Rfl: 0 .  BOTOX 100 UNITS SOLR injection, Inject into the  muscle every 3 (three) months. , Disp: , Rfl:  .  chlorproMAZINE (THORAZINE) 25 MG tablet, Take 25 mg by mouth 3 (three) times daily as needed. , Disp: , Rfl:  .  CYCLOBENZAPRINE HCL PO, Take 1 tablet by mouth daily as needed., Disp: , Rfl:  .  ezetimibe-simvastatin (VYTORIN) 10-40 MG tablet, TAKE 1 TABLET BY MOUTH AT BEDTIME. PLEASE SCHEDULE APPOINTMENT FOR REFILLS., Disp: 30 tablet, Rfl: 0 .  fluocinonide ointment (LIDEX) 6.25 %, APPLY 1 APPLICATION TOPICALLY 2 (TWO) TIMES DAILY., Disp: 30 g, Rfl: 1 .  fluticasone (FLONASE) 50 MCG/ACT nasal spray, PLACE 2 SPRAYS INTO BOTH NOSTRILS DAILY., Disp: 48 g, Rfl: 3 .  furosemide (LASIX) 20 MG tablet, Take 1 tablet (20 mg total) by mouth daily as needed for fluid or edema., Disp: 30 tablet, Rfl: 0 .  glucose blood test strip, Test fasting each morning and 2 hours before supper. Retest if having hypoglycemic symptoms., Disp: 100 each, Rfl: 12 .  ketoconazole (NIZORAL) 2 % cream, Apply 1  application topically daily as needed for irritation., Disp: 15 g, Rfl: 0 .  lamoTRIgine (LAMICTAL) 100 MG tablet, Take 100 mg by mouth 2 (two) times daily., Disp: , Rfl:  .  Lancets (ONETOUCH ULTRASOFT) lancets, Test fasting each morning and 2 hours before supper. Retest if having hypoglycemic symptoms., Disp: 100 each, Rfl: 12 .  metFORMIN (GLUCOPHAGE) 500 MG tablet, TAKE 2 TABLETS IN THE MORNING, 1 TABLET AT LUNCH TIME AND 2 TABLETS IN THE EVENING, Disp: 450 tablet, Rfl: 1 .  metoprolol tartrate (LOPRESSOR) 50 MG tablet, TAKE 1.5 TABLETS (75 MG TOTAL) BY MOUTH 2 (TWO) TIMES DAILY. NEED OV., Disp: 90 tablet, Rfl: 0 .  potassium chloride (MICRO-K) 10 MEQ CR capsule, Take 1 capsule (10 mEq total) by mouth daily as needed. (Patient taking differently: Take 10 mEq by mouth daily. ), Disp: 30 capsule, Rfl: 5 .  TEGRETOL-XR 400 MG 12 hr tablet, TAKE 1 TABLET BY MOUTH TWICE DAILY, Disp: 60 tablet, Rfl: 4 .  triamcinolone cream (KENALOG) 0.1 %, Apply 1 application topically 2 (two)  times daily., Disp: , Rfl:  .  VIIBRYD 40 MG TABS, TAKE 1 TABLET BY MOUTH EVERY DAY, Disp: 30 tablet, Rfl: 3 .  azelastine (ASTELIN) 0.1 % nasal spray, Place 2 sprays into both nostrils 2 (two) times daily. Use in each nostril as directed (Patient not taking: Reported on 07/15/2017), Disp: 30 mL, Rfl: 3  Review of Systems  Constitutional: Negative.   Respiratory: Negative.   Cardiovascular: Negative.   Skin:       Right breast lump    Social History   Tobacco Use  . Smoking status: Former Smoker    Types: Cigarettes    Last attempt to quit: 01/03/1983    Years since quitting: 34.5  . Smokeless tobacco: Never Used  . Tobacco comment: quit in 1984  Substance Use Topics  . Alcohol use: No    Alcohol/week: 0.0 oz   Objective:   BP (!) 148/78 (BP Location: Right Arm, Patient Position: Sitting, Cuff Size: Normal)   Pulse 78   Temp 98.6 F (37 C) (Oral)   Wt 218 lb 6.4 oz (99.1 kg)   LMP  (LMP Unknown)   SpO2 96%   BMI 38.69 kg/m  Vitals:   07/25/17 1514  BP: (!) 148/78  Pulse: 78  Temp: 98.6 F (37 C)  TempSrc: Oral  SpO2: 96%  Weight: 218 lb 6.4 oz (99.1 kg)   Physical Exam  Constitutional: She is well-developed, well-nourished, and in no distress.  HENT:  Head: Normocephalic.  Eyes: Conjunctivae are normal.  Neck: Neck supple.  Cardiovascular: Normal rate.  Pulmonary/Chest: Effort normal and breath sounds normal.  Slight tenderness of the right breast at 6 o'clock 3 cm below areola. Questionable 2 cm cystic lesion. No nipple discharge, dimpling or local lymphadenopathy.  Lymphadenopathy:    She has no cervical adenopathy.      Assessment & Plan:     1. Mass of lower outer quadrant of right breast Noticed a tender mass in the right breast over the past 2 weeks. No nipple discharge, local lymphadenopathy or dimpling. Needs diagnostic mammograms with ultrasound. - MM DIAG BREAST TOMO BILATERAL - US BREAST LTD UNI LEFT INC AXILLA - US BREAST LTD UNI RIGHT INC  [place]

## 2017-07-29 ENCOUNTER — Ambulatory Visit
Admission: RE | Admit: 2017-07-29 | Discharge: 2017-07-29 | Disposition: A | Discharge status: Home / Self Care | Payer: Medicare Other | Source: Ambulatory Visit | Attending: Family Medicine | Admitting: Family Medicine

## 2017-07-29 ENCOUNTER — Ambulatory Visit: Admission: RE | Admit: 2017-07-29 | Payer: Medicare Other | Source: Ambulatory Visit

## 2017-07-29 DIAGNOSIS — R928 Other abnormal and inconclusive findings on diagnostic imaging of breast: Secondary | ICD-10-CM | POA: Diagnosis not present

## 2017-07-29 DIAGNOSIS — N6313 Unspecified lump in the right breast, lower outer quadrant: Secondary | ICD-10-CM | POA: Insufficient documentation

## 2017-07-29 DIAGNOSIS — N6489 Other specified disorders of breast: Secondary | ICD-10-CM | POA: Diagnosis not present

## 2017-08-08 ENCOUNTER — Other Ambulatory Visit: Payer: Self-pay | Admitting: Family Medicine

## 2017-08-12 ENCOUNTER — Other Ambulatory Visit: Payer: Self-pay | Admitting: Family Medicine

## 2017-08-12 MED ORDER — LEVOCETIRIZINE DIHYDROCHLORIDE 5 MG PO TABS: 5.0000 mg | ORAL_TABLET | Freq: Every evening | ORAL | 1 refills | Status: DC

## 2017-08-12 NOTE — Telephone Encounter (Signed)
Has been given Xyzal 5 mg qd with Astelin Nasal spray 1-2 sprays BID. Was it one of these?

## 2017-08-12 NOTE — Telephone Encounter (Signed)
Pt has been having congestion and sinus problems.  She has been taking something otc but its not helping.  SHe said [name] gave her samples one time  She cant remember the name but it helped.  She is using the nasal spray too.  Her call back is [phone]  She uses [facility] [address]  Thanks Con Memos

## 2017-08-12 NOTE — Telephone Encounter (Signed)
Patient states the medication was Xyzal. She is requesting a RX be sent to [facility].

## 2017-08-15 ENCOUNTER — Other Ambulatory Visit: Payer: Self-pay | Admitting: Cardiovascular Disease

## 2017-08-24 DIAGNOSIS — G518 Other disorders of facial nerve: Secondary | ICD-10-CM | POA: Diagnosis not present

## 2017-08-24 DIAGNOSIS — M542 Cervicalgia: Secondary | ICD-10-CM | POA: Diagnosis not present

## 2017-08-24 DIAGNOSIS — G43719 Chronic migraine without aura, intractable, without status migrainosus: Secondary | ICD-10-CM | POA: Diagnosis not present

## 2017-08-24 DIAGNOSIS — M791 Myalgia, unspecified site: Secondary | ICD-10-CM | POA: Diagnosis not present

## 2017-08-30 ENCOUNTER — Ambulatory Visit: Payer: Medicare Other | Admitting: Certified Registered Nurse Anesthetist

## 2017-08-30 ENCOUNTER — Ambulatory Visit
Admission: RE | Admit: 2017-08-30 | Discharge: 2017-08-30 | Disposition: A | Discharge status: Home / Self Care | Payer: Medicare Other | Source: Ambulatory Visit | Attending: Gastroenterology | Admitting: Gastroenterology

## 2017-08-30 ENCOUNTER — Encounter
Admission: RE | Disposition: A | Discharge status: Home / Self Care | Payer: Self-pay | Source: Ambulatory Visit | Attending: Gastroenterology

## 2017-08-30 DIAGNOSIS — I509 Heart failure, unspecified: Secondary | ICD-10-CM | POA: Insufficient documentation

## 2017-08-30 DIAGNOSIS — Z6838 Body mass index (BMI) 38.0-38.9, adult: Secondary | ICD-10-CM | POA: Insufficient documentation

## 2017-08-30 DIAGNOSIS — Z79899 Other long term (current) drug therapy: Secondary | ICD-10-CM | POA: Insufficient documentation

## 2017-08-30 DIAGNOSIS — Z885 Allergy status to narcotic agent status: Secondary | ICD-10-CM | POA: Diagnosis not present

## 2017-08-30 DIAGNOSIS — M1990 Unspecified osteoarthritis, unspecified site: Secondary | ICD-10-CM | POA: Insufficient documentation

## 2017-08-30 DIAGNOSIS — Z86010 Personal history of colonic polyps: Secondary | ICD-10-CM | POA: Insufficient documentation

## 2017-08-30 DIAGNOSIS — E7800 Pure hypercholesterolemia, unspecified: Secondary | ICD-10-CM | POA: Diagnosis not present

## 2017-08-30 DIAGNOSIS — K635 Polyp of colon: Secondary | ICD-10-CM | POA: Diagnosis not present

## 2017-08-30 DIAGNOSIS — E119 Type 2 diabetes mellitus without complications: Secondary | ICD-10-CM | POA: Diagnosis not present

## 2017-08-30 DIAGNOSIS — F329 Major depressive disorder, single episode, unspecified: Secondary | ICD-10-CM | POA: Insufficient documentation

## 2017-08-30 DIAGNOSIS — D123 Benign neoplasm of transverse colon: Secondary | ICD-10-CM | POA: Diagnosis not present

## 2017-08-30 DIAGNOSIS — Z87891 Personal history of nicotine dependence: Secondary | ICD-10-CM | POA: Diagnosis not present

## 2017-08-30 DIAGNOSIS — Z7984 Long term (current) use of oral hypoglycemic drugs: Secondary | ICD-10-CM | POA: Diagnosis not present

## 2017-08-30 DIAGNOSIS — Z1211 Encounter for screening for malignant neoplasm of colon: Secondary | ICD-10-CM | POA: Insufficient documentation

## 2017-08-30 DIAGNOSIS — I110 Hypertensive heart disease with heart failure: Secondary | ICD-10-CM | POA: Diagnosis not present

## 2017-08-30 DIAGNOSIS — F419 Anxiety disorder, unspecified: Secondary | ICD-10-CM | POA: Insufficient documentation

## 2017-08-30 DIAGNOSIS — K644 Residual hemorrhoidal skin tags: Secondary | ICD-10-CM | POA: Diagnosis not present

## 2017-08-30 DIAGNOSIS — G4733 Obstructive sleep apnea (adult) (pediatric): Secondary | ICD-10-CM | POA: Diagnosis not present

## 2017-08-30 DIAGNOSIS — Z7951 Long term (current) use of inhaled steroids: Secondary | ICD-10-CM | POA: Insufficient documentation

## 2017-08-30 HISTORY — PX: COLONOSCOPY WITH PROPOFOL: SHX5780

## 2017-08-30 LAB — GLUCOSE, CAPILLARY: GLUCOSE-CAPILLARY: 160 mg/dL — AB (ref 65–99)

## 2017-08-30 SURGERY — COLONOSCOPY WITH PROPOFOL
Anesthesia: General

## 2017-08-30 MED ORDER — LIDOCAINE HCL (CARDIAC) PF 100 MG/5ML IV SOSY
PREFILLED_SYRINGE | INTRAVENOUS | Status: DC | PRN
  Administered 2017-08-30: 50 mg via INTRAVENOUS

## 2017-08-30 MED ORDER — PROPOFOL 10 MG/ML IV BOLUS
INTRAVENOUS | Status: AC
  Filled 2017-08-30: qty 20

## 2017-08-30 MED ORDER — LIDOCAINE HCL (PF) 2 % IJ SOLN
INTRAMUSCULAR | Status: AC
  Filled 2017-08-30: qty 10

## 2017-08-30 MED ORDER — PROPOFOL 10 MG/ML IV BOLUS
INTRAVENOUS | Status: DC | PRN
  Administered 2017-08-30: 50 mg via INTRAVENOUS
  Administered 2017-08-30: 20 mg via INTRAVENOUS
  Administered 2017-08-30: 30 mg via INTRAVENOUS
  Administered 2017-08-30: 20 mg via INTRAVENOUS
  Administered 2017-08-30: 50 mg via INTRAVENOUS
  Administered 2017-08-30: 30 mg via INTRAVENOUS

## 2017-08-30 MED ORDER — PROPOFOL 500 MG/50ML IV EMUL
INTRAVENOUS | Status: DC | PRN
  Administered 2017-08-30: 200 ug/kg/min via INTRAVENOUS

## 2017-08-30 MED ORDER — SODIUM CHLORIDE 0.9 % IV SOLN
INTRAVENOUS | Status: DC
  Administered 2017-08-30: 1000 mL via INTRAVENOUS

## 2017-08-30 NOTE — Anesthesia Post-op Follow-up Note (Signed)
Anesthesia QCDR form completed.        

## 2017-08-30 NOTE — Anesthesia Procedure Notes (Signed)
Procedure Name: MAC Date/Time: 08/30/2017 1:25 PM Performed by: Rudean Hitt, CRNA Pre-anesthesia Checklist: Patient identified, Emergency Drugs available, Suction available, Patient being monitored and Timeout performed Patient Re-evaluated:Patient Re-evaluated prior to induction Oxygen Delivery Method: Nasal cannula

## 2017-08-30 NOTE — H&P (Signed)
Cephas Dar[place], MD [address]  [place]  [place], [place] [address]  Main: [phone]  Fax: [phone] Pager: [phone]  Primary Care Physician:  Margo Common, PA Primary Gastroenterologist:  Dr. Cephas Dar[place]  Pre-Procedure History & Physical: HPI:  Abigail Figueroa is a 57 y.o. female is here for an colonoscopy.   Past Medical History:  Diagnosis Date  . Abnormal stress test    a. 10/2009: Normal myocardial perfusion imaging; b. 08/2015 MV: medium defect of mod severity in mid ant apical region w/ mild HK of the distal inf wall;  c. 08/2015 Cath: nl Cors, EF 60%.  . Anemia   . Anxiety   . Arthritis   . Bell's palsy   . Depression   . Edema   . Essential hypertension   . Frequent urination   . Frequent urination at night   . Headache    migraines, gets botox injections  . Heart palpitations June 2013   Event monitor showing sinus tachycardia, PACs with couplets and triplets.  Marland Kitchen Hx of echocardiogram    a. 10/2009 Echo: showed normal left ventricular function, mild left ventricular hypertrophy, and no significant valve abnormalities.  . Hypercholesterolemia   . Migraine   . Morbid obesity ([place])   . Neuromuscular disorder ([place])   . OSA (obstructive sleep apnea)    has been on continuous positive airway pressure  . Seizure disorder ([place])   . Seizures ([place])   . Type II diabetes mellitus ([place])     Past Surgical History:  Procedure Laterality Date  . ABDOMINAL HYSTERECTOMY  1990   without BSO  . BACK SURGERY  1900s 2001   x2 with "cage put in"  . BILATERAL SALPINGOOPHORECTOMY  1990  . CARDIAC CATHETERIZATION N/A 08/06/2015   Procedure: Left Heart Cath and Coronary Angiography;  Surgeon: Troy Sine, MD;  Location: [facility];  Service: Cardiovascular;  Laterality: N/A;  . CARPAL TUNNEL RELEASE Bilateral   . CHOLECYSTECTOMY  1980  . DORSAL COMPARTMENT RELEASE Left 10/25/2014   Procedure: LEFT FIRST  DORSAL COMPARTMENT RELEASE AND RADIAL  TENOSYNOVECTOMY ;  Surgeon: Roseanne Kaufman, MD;  Location: [facility];  Service: Orthopedics;  Laterality: Left;  . HAND SURGERY    . KNEE SURGERY Right 2012   meniscus tear  . KNEE SURGERY  2012  . LAMINECTOMY  1995  . TUBAL LIGATION  1982  . WRIST SURGERY Right     Prior to Admission medications   Medication Sig Start Date End Date Taking? Authorizing Provider  baclofen (LIORESAL) 10 MG tablet Take 1 tablet (10 mg total) by mouth 3 (three) times daily. Patient taking differently: Take 10 mg by mouth 2 (two) times daily as needed (Headaches).  04/10/15  Yes Chrismon, Vickki Muff, PA  BOTOX 100 UNITS SOLR injection Inject into the muscle every 3 (three) months.  11/01/12  Yes [provider]  chlorproMAZINE (THORAZINE) 25 MG tablet Take 25 mg by mouth 3 (three) times daily as needed.    Yes [provider]  CYCLOBENZAPRINE HCL PO Take 1 tablet by mouth daily as needed.   Yes [provider]  ezetimibe-simvastatin (VYTORIN) 10-40 MG tablet TAKE 1 TABLET BY MOUTH AT BEDTIME. PLEASE SCHEDULE APPOINTMENT FOR REFILLS. 09/17/16  Yes Troy Sine, MD  fluocinonide ointment (LIDEX) 9.62 % APPLY 1 APPLICATION TOPICALLY 2 (TWO) TIMES DAILY. 07/19/17  Yes Chrismon, Vickki Muff, PA  fluticasone (FLONASE) 50 MCG/ACT nasal spray PLACE 2 SPRAYS  INTO BOTH NOSTRILS DAILY. 12/06/16  Yes Chrismon, Vickki Muff, PA  furosemide (LASIX) 20 MG tablet Take 1 tablet (20 mg total) by mouth daily as needed for fluid or edema. 12/09/16  Yes Troy Sine, MD  ketoconazole (NIZORAL) 2 % cream Apply 1 application topically daily as needed for irritation. 05/12/17  Yes Chrismon, Vickki Muff, PA  lamoTRIgine (LAMICTAL) 100 MG tablet Take 100 mg by mouth 2 (two) times daily.   Yes [provider]  Lancets (ONETOUCH ULTRASOFT) lancets Test fasting each morning and 2 hours before supper. Retest if having hypoglycemic symptoms. 11/15/16  Yes Chrismon, Vickki Muff, PA  levocetirizine (XYZAL) 5 MG  tablet Take 1 tablet (5 mg total) by mouth every evening. 08/12/17  Yes Chrismon, Vickki Muff, PA  metFORMIN (GLUCOPHAGE) 500 MG tablet TAKE 2 TABLETS IN THE MORNING, 1 TABLET AT LUNCH TIME AND 2 TABLETS IN THE EVENING 09/20/16  Yes Chauvin, Herbie [place], PA  metoprolol tartrate (LOPRESSOR) 50 MG tablet TAKE 1.5 TABLETS (75 MG TOTAL) BY MOUTH 2 (TWO) TIMES DAILY. NEED OV. 08/15/17  Yes Troy Sine, MD  [facility] VERIO test strip TEST FASTING GLUCOSE DAILY AS DIRECTED 08/08/17  Yes Chrismon, Vickki Muff, PA  potassium chloride (MICRO-K) 10 MEQ CR capsule Take 1 capsule (10 mEq total) by mouth daily as needed. Patient taking differently: Take 10 mEq by mouth daily.  07/22/15  Yes Brett Canales, PA-C  TEGRETOL-XR 400 MG 12 hr tablet TAKE 1 TABLET BY MOUTH TWICE DAILY 06/13/17  Yes Chrismon, Vickki Muff, PA  triamcinolone cream (KENALOG) 0.1 % Apply 1 application topically 2 (two) times daily.   Yes [provider]  VIIBRYD 40 MG TABS TAKE 1 TABLET BY MOUTH EVERY DAY 06/13/17  Yes Chrismon, Vickki Muff, PA  azelastine (ASTELIN) 0.1 % nasal spray Place 2 sprays into both nostrils 2 (two) times daily. Use in each nostril as directed Patient not taking: Reported on 07/15/2017 06/18/17   Birdie Sons, MD    Allergies as of 07/18/2017 - Review Complete 07/15/2017  Allergen Reaction Noted  . Morphine and related Hives 10/14/2014  . Oxycontin [oxycodone hcl] Swelling, Hives, and Itching 08/09/2013    Family History  Problem Relation Age of Onset  . Sarcoidosis Mother   . Seizures Mother   . Heart attack Mother   . Alzheimer's disease Maternal Grandmother   . Dementia Maternal Grandmother   . Hypertension Maternal Grandfather   . Diabetes Maternal Grandfather   . Breast cancer Neg Hx     Social History   Socioeconomic History  . Marital status: Married    Spouse name: Not on file  . Number of children: 2  . Years of education: Not on file  . Highest education level: 12th grade  Occupational History    . Occupation: disabled  Social Needs  . Financial resource strain: Not hard at all  . Food insecurity:    Worry: Never true    Inability: Never true  . Transportation needs:    Medical: No    Non-medical: No  Tobacco Use  . Smoking status: Former Smoker    Types: Cigarettes    Last attempt to quit: 01/03/1983    Years since quitting: 34.6  . Smokeless tobacco: Never Used  . Tobacco comment: quit in 1984  Substance and Sexual Activity  . Alcohol use: No    Alcohol/week: 0.0 oz  . Drug use: No  . Sexual activity: Never  Lifestyle  . Physical activity:  Days per week: Not on file    Minutes per session: Not on file  . Stress: Rather much  Relationships  . Social connections:    Talks on phone: Not on file    Gets together: Not on file    Attends religious service: Not on file    Active member of club or organization: Not on file    Attends meetings of clubs or organizations: Not on file    Relationship status: Not on file  . Intimate partner violence:    Fear of current or ex partner: Not on file    Emotionally abused: Not on file    Physically abused: Not on file    Forced sexual activity: Not on file  Other Topics Concern  . Not on file  Social History Narrative   ** Merged History Encounter **       She is a married mother of 2, grandmother 3. She tries to get exercise but is not doing any routine program.   She quit smoking over 25 years ago does not drink alcohol.    Review of Systems: See HPI, otherwise negative ROS  Physical Exam: BP (!) 149/90   Pulse 73   Temp 97.6 F (36.4 C) (Tympanic)   Resp 18   Ht 5\' 3"  (1.6 m)   Wt 217 lb (98.4 kg)   LMP  (LMP Unknown)   SpO2 99%   BMI 38.44 kg/m  General:   Alert,  pleasant and cooperative in NAD Head:  Normocephalic and atraumatic. Neck:  Supple; no masses or thyromegaly. Lungs:  Clear throughout to auscultation.    Heart:  Regular rate and rhythm. Abdomen:  Soft, nontender and nondistended. Normal  bowel sounds, without guarding, and without rebound.   Neurologic:  Alert and  oriented x4;  grossly normal neurologically.  Impression/Plan: Abigail Figueroa is here for an colonoscopy to be performed for surveillance of colon polyps  Risks, benefits, limitations, and alternatives regarding  colonoscopy have been reviewed with the patient.  Questions have been answered.  All parties agreeable.   Sherri Sear, MD  08/30/2017, 1:20 PM

## 2017-08-30 NOTE — Anesthesia Preprocedure Evaluation (Addendum)
Anesthesia Evaluation  Patient identified by MRN, date of birth, ID band Patient awake    Reviewed: Allergy & Precautions, H&P , NPO status , reviewed documented beta blocker date and time   Airway Mallampati: II  TM Distance: >3 FB Neck ROM: full    Dental  (+) Teeth Intact   Pulmonary sleep apnea and Continuous Positive Airway Pressure Ventilation , former smoker,    Pulmonary exam normal        Cardiovascular hypertension, +CHF  Normal cardiovascular exam  Stress Test 2017 There is a medium defect of moderate severity present in the mid anterior, apical anterior and apex location. The defect is reversible. Overall Study Impression Myocardial perfusion is abnormal. Findings consistent with ischemia. This is an intermediate risk study. Overall left ventricular systolic function was normal. Nuclear stress EF: 56%.  Cath 2017 The left ventricular systolic function is normal. Normal coronary arteries. Normal LV function with an ejection fraction of at least 60%.  RECOMMENDATION: The patient's stress test is a false positive study   Neuro/Psych  Headaches, Seizures -,  PSYCHIATRIC DISORDERS Anxiety Depression  Neuromuscular disease    GI/Hepatic GERD  Medicated and Controlled,  Endo/Other  diabetes  Renal/GU      Musculoskeletal  (+) Arthritis ,   Abdominal   Peds  Hematology  (+) anemia ,   Anesthesia Other Findings   Reproductive/Obstetrics                            Anesthesia Physical Anesthesia Plan  ASA: III  Anesthesia Plan: General   Post-op Pain Management:    Induction:   PONV Risk Score and Plan: 3 and TIVA  Airway Management Planned:   Additional Equipment:   Intra-op Plan:   Post-operative Plan:   Informed Consent: I have reviewed the patients History and Physical, chart, labs and discussed the procedure including the risks, benefits and alternatives for the  proposed anesthesia with the patient or authorized representative who has indicated his/her understanding and acceptance.   Dental Advisory Given  Plan Discussed with: CRNA  Anesthesia Plan Comments:         Anesthesia Quick Evaluation

## 2017-08-30 NOTE — Transfer of Care (Signed)
Immediate Anesthesia Transfer of Care Note  Patient: Abigail Figueroa  Procedure(s) Performed: COLONOSCOPY WITH PROPOFOL (N/A )  Patient Location: PACU  Anesthesia Type:General  Level of Consciousness: awake, alert  and oriented  Airway & Oxygen Therapy: Patient Spontanous Breathing and Patient connected to nasal cannula oxygen  Post-op Assessment: Report given to RN and Post -op Vital signs reviewed and stable  Post vital signs: Reviewed and stable  Last Vitals:  Vitals Value Taken Time  BP 121/73 08/30/2017  1:40 PM  Temp 36.1 C 08/30/2017  1:40 PM  Pulse 70 08/30/2017  1:49 PM  Resp 22 08/30/2017  1:49 PM  SpO2 100 % 08/30/2017  1:49 PM  Vitals shown include unvalidated device data.  Last Pain:  Vitals:   08/30/17 1340  TempSrc: Tympanic  PainSc:          Complications: No apparent anesthesia complications

## 2017-08-30 NOTE — Op Note (Signed)
[facility] Gastroenterology Patient Name: Abigail Figueroa Procedure Date: 08/30/2017 1:21 PM MRN: [id number] Account #: [id number] Date of Birth: 1960/06/10 Admit Type: Outpatient Age: 57 Room: [facility] ENDO ROOM 4 Gender: Female Note Status: Finalized Procedure:            Colonoscopy Indications:          Screening for colorectal malignant neoplasm, High risk                        colon cancer surveillance: Personal history of colonic                        polyps, Last colonoscopy: July 2008 Providers:            Lin Landsman MD, MD Referring MD:         Vickki Muff. Chrismon, MD (Referring MD) Medicines:            Monitored Anesthesia Care Complications:        No immediate complications. Estimated blood loss: None. Procedure:            Pre-Anesthesia Assessment:                       - Prior to the procedure, a History and Physical was                        performed, and patient medications and allergies were                        reviewed. The patient is competent. The risks and                        benefits of the procedure and the sedation options and                        risks were discussed with the patient. All questions                        were answered and informed consent was obtained.                        Patient identification and proposed procedure were                        verified by the physician, the nurse, the                        anesthesiologist, the anesthetist and the technician in                        the pre-procedure area in the procedure room in the                        endoscopy suite. Mental Status Examination: alert and                        oriented. Airway Examination: normal oropharyngeal                        airway and neck mobility. Respiratory Examination:  clear to auscultation. CV Examination: normal.                        Prophylactic Antibiotics: The patient does not require                         prophylactic antibiotics. Prior Anticoagulants: The                        patient has taken no previous anticoagulant or                        antiplatelet agents. ASA Grade Assessment: III - A                        patient with severe systemic disease. After reviewing                        the risks and benefits, the patient was deemed in                        satisfactory condition to undergo the procedure. The                        anesthesia plan was to use monitored anesthesia care                        (MAC). Immediately prior to administration of                        medications, the patient was re-assessed for adequacy                        to receive sedatives. The heart rate, respiratory rate,                        oxygen saturations, blood pressure, adequacy of                        pulmonary ventilation, and response to care were                        monitored throughout the procedure. The physical status                        of the patient was re-assessed after the procedure.                       After obtaining informed consent, the colonoscope was                        passed under direct vision. Throughout the procedure,                        the patient's blood pressure, pulse, and oxygen                        saturations were monitored continuously. The                        Colonoscope  was introduced through the anus and                        advanced to the the cecum, identified by appendiceal                        orifice and ileocecal valve. The colonoscopy was                        performed without difficulty. The patient tolerated the                        procedure well. The quality of the bowel preparation                        was evaluated using the BBPS [facility] Bowel Preparation                        Scale) with scores of: Right Colon = 3, Transverse                        Colon = 3 and Left Colon = 3 (entire  mucosa seen well                        with no residual staining, small fragments of stool or                        opaque liquid). The total BBPS score equals 9. Findings:      The perianal and digital rectal examinations were normal. Pertinent       negatives include normal sphincter tone and no palpable rectal lesions.      A diminutive polyp was found in the transverse colon. The polyp was       sessile. The polyp was removed with a cold snare. Resection and       retrieval were complete.      External hemorrhoids were found during retroflexion. The hemorrhoids       were medium-sized.      Normal mucosa was found in the entire colon. Biopsies for histology were       taken with a cold forceps from the entire colon for evaluation of       microscopic colitis. Impression:           - One diminutive polyp in the transverse colon, removed                        with a cold snare. Resected and retrieved.                       - External hemorrhoids.                       - Normal mucosa in the entire examined colon. Biopsied. Recommendation:       - Discharge patient to home.                       - Resume previous diet today.                       - Continue present medications.                       -  Await pathology results.                       - Repeat colonoscopy in 5-10 years for surveillance                        based on pathology results.                       - Return to GI clinic in 2 weeks. Procedure Code(s):    --- Professional ---                       [phone], Colonoscopy, flexible; with removal of tumor(s),                        polyp(s), or other lesion(s) by snare technique                       45380, 26, Colonoscopy, flexible; with biopsy, single                        or multiple Diagnosis Code(s):    --- Professional ---                       Z12.11, Encounter for screening for malignant neoplasm                        of colon                       Z86.010,  Personal history of colonic polyps                       D12.3, Benign neoplasm of transverse colon (hepatic                        flexure or splenic flexure)                       K64.4, Residual hemorrhoidal skin tags CPT copyright 2017 American Medical Association. All rights reserved. The codes documented in this report are preliminary and upon coder review may  be revised to meet current compliance requirements. Dr. Ulyess Mort Lin Landsman MD, MD 08/30/2017 1:44:34 PM This report has been signed electronically. Number of Addenda: 0 Note Initiated On: 08/30/2017 1:21 PM Scope Withdrawal Time: 0 hours 12 minutes 20 seconds  Total Procedure Duration: 0 hours 15 minutes 22 seconds       [facility]

## 2017-08-31 LAB — SURGICAL PATHOLOGY

## 2017-08-31 NOTE — Anesthesia Postprocedure Evaluation (Signed)
Anesthesia Post Note  Patient: Abigail Figueroa  Procedure(s) Performed: COLONOSCOPY WITH PROPOFOL (N/A )  Patient location during evaluation: Endoscopy Anesthesia Type: General Level of consciousness: awake and alert Pain management: pain level controlled Vital Signs Assessment: post-procedure vital signs reviewed and stable Respiratory status: spontaneous breathing, nonlabored ventilation and respiratory function stable Cardiovascular status: blood pressure returned to baseline and stable Postop Assessment: no apparent nausea or vomiting Anesthetic complications: no     Last Vitals:  Vitals:   08/30/17 1400 08/30/17 1410  BP: 123/83 140/84  Pulse: 68 64  Resp: 15 15  Temp:    SpO2: 98% 99%    Last Pain:  Vitals:   08/30/17 1340  TempSrc: Tympanic  PainSc:                  Alphonsus Sias

## 2017-09-01 ENCOUNTER — Encounter: Payer: Self-pay | Admitting: Gastroenterology

## 2017-09-20 ENCOUNTER — Ambulatory Visit (INDEPENDENT_AMBULATORY_CARE_PROVIDER_SITE_OTHER): Payer: Medicare Other | Admitting: Physician Assistant

## 2017-09-20 ENCOUNTER — Encounter: Payer: Self-pay | Admitting: Physician Assistant

## 2017-09-20 VITALS — BP 144/86 | HR 64 | Ht 63.0 in | Wt 217.0 lb

## 2017-09-20 DIAGNOSIS — I10 Essential (primary) hypertension: Secondary | ICD-10-CM | POA: Diagnosis not present

## 2017-09-20 DIAGNOSIS — R002 Palpitations: Secondary | ICD-10-CM

## 2017-09-20 DIAGNOSIS — M7989 Other specified soft tissue disorders: Secondary | ICD-10-CM

## 2017-09-20 DIAGNOSIS — E119 Type 2 diabetes mellitus without complications: Secondary | ICD-10-CM | POA: Diagnosis not present

## 2017-09-20 DIAGNOSIS — R42 Dizziness and giddiness: Secondary | ICD-10-CM | POA: Diagnosis not present

## 2017-09-20 DIAGNOSIS — R0683 Snoring: Secondary | ICD-10-CM

## 2017-09-20 DIAGNOSIS — E782 Mixed hyperlipidemia: Secondary | ICD-10-CM

## 2017-09-20 DIAGNOSIS — G4733 Obstructive sleep apnea (adult) (pediatric): Secondary | ICD-10-CM | POA: Diagnosis not present

## 2017-09-20 LAB — BASIC METABOLIC PANEL
BUN / CREAT RATIO: 20 (ref 9–23)
BUN: 20 mg/dL (ref 6–24)
CHLORIDE: 105 mmol/L (ref 96–106)
CO2: 21 mmol/L (ref 20–29)
Calcium: 10.3 mg/dL — ABNORMAL HIGH (ref 8.7–10.2)
Creatinine, Ser: 1.01 mg/dL — ABNORMAL HIGH (ref 0.57–1.00)
GFR, EST AFRICAN AMERICAN: 72 mL/min/{1.73_m2} (ref >59)
GFR, EST NON AFRICAN AMERICAN: 62 mL/min/{1.73_m2} (ref >59)
GLUCOSE: 151 mg/dL — AB (ref 65–99)
POTASSIUM: 4.6 mmol/L (ref 3.5–5.2)
SODIUM: 142 mmol/L (ref 134–144)

## 2017-09-20 MED ORDER — METOPROLOL TARTRATE 50 MG PO TABS: 75.0000 mg | ORAL_TABLET | Freq: Two times a day (BID) | ORAL | 6 refills | Status: DC

## 2017-09-20 MED ORDER — ATORVASTATIN CALCIUM 40 MG PO TABS: 40.0000 mg | ORAL_TABLET | Freq: Every day | ORAL | 6 refills | Status: DC

## 2017-09-20 NOTE — Patient Instructions (Addendum)
Medication Instructions:  TAKE YOUR METOPROLOL TARTRATE 75MG  TWICE A DAY START Lipitor 40mg  Take 1 tablet once a day  Labwork: Your physician recommends that you return for lab work in: TODAY-BMET Your physician recommends that you return for lab work in: 2 MONTHS FASTING-LIPIDS, LFT  Testing/Procedures: Our Sleep Coordinator will contact you about your sleep study once she hears back from your insurance  Follow-Up: Your physician recommends that you schedule a follow-up appointment in: 4-5 months with Dr Claiborne Billings.   Any Other Special Instructions Will Be Listed Below (If Applicable).  If you need a refill on your cardiac medications before your next appointment, please call your pharmacy.

## 2017-09-20 NOTE — Progress Notes (Signed)
Cardiology Office Note    Date:  09/20/2017   ID:  Abigail Figueroa, DOB 04/20/1961, MRN [id number]  PCP:  Margo Common, PA  Cardiologist:  Dr. Claiborne Billings  Chief Complaint  Patient presents with  . Follow-up    seen for Dr. Claiborne Billings, overdue 4 month followup. Leg swelling and dizziness    History of Present Illness:  Abigail Figueroa is a 57 y.o. female with PMH of HTN, peripheral edema, palpitation, hyperlipidemia, DM 2, depression, migraine and OSA not on CPAP.  In April 2017, she underwent preoperative clearance study prior to undergoing lumbar back surgery, this came back as intermediate risk with possible defect in the mid distal anterior wall and apex and also distal inferior hypokinesis.  She underwent cardiac catheterization on 08/06/2015 and this revealed normal coronary arteries.  She was admitted in September 2017 with Bell's palsy.  She recently underwent colonoscopy on 08/30/2017, she had a single polyp resected from transverse colon and this revealed tubular adenoma, however no high-grade dysplasia or malignancy.  Patient presents today for cardiology office visit.  She denies any recent chest pain or shortness of breath.  She has been having some lower extremity edema, instead of taking 40 mg Lasix as needed, she is essentially taking 40 mg daily of Lasix since March.  However she is not taking her potassium supplement.  I will obtain a basic metabolic panel to assess her renal function and electrolyte.  Her swelling seems to be worse throughout the day, I recommended conservative therapy including leg elevation and cut back on salt.  Recent cholesterol panel also showed severely uncontrolled LDL in the 190 range, I have started her on 40 mg daily of Lipitor.  She is no longer taking Vytorin due to side effect.  She will need a fasting lipid panel and LFT in 2 months.  Furthermore, instead of taking metoprolol 75 mg twice a day, she has been taking metoprolol tartrate 150 mg at night  only.  This may be the reason why her blood pressure fluctuate throughout the day, I recommended her to change metoprolol back to 75 mg twice daily instead.  She does complain of occasional dizziness, her dizziness is not related to body position changes or related to particular time of the day.  However there is many reason why she could have dizziness, since we are adjusting her blood pressure medication, I would like to hold off on further evaluation of dizziness.  I asked her to monitor her dizziness for now.  Dr. Claiborne Billings recommended a split-night study for her CPAP, this was not done last year, she is willing to have it done this year.  I recommended for her to have the sleep study this year.   Past Medical History:  Diagnosis Date  . Abnormal stress test    a. 10/2009: Normal myocardial perfusion imaging; b. 08/2015 MV: medium defect of mod severity in mid ant apical region w/ mild HK of the distal inf wall;  c. 08/2015 Cath: nl Cors, EF 60%.  . Anemia   . Anxiety   . Arthritis   . Bell's palsy   . Depression   . Edema   . Essential hypertension   . Frequent urination   . Frequent urination at night   . Headache    migraines, gets botox injections  . Heart palpitations June 2013   Event monitor showing sinus tachycardia, PACs with couplets and triplets.  Marland Kitchen Hx of echocardiogram    a. 10/2009  Echo: showed normal left ventricular function, mild left ventricular hypertrophy, and no significant valve abnormalities.  . Hypercholesterolemia   . Migraine   . Morbid obesity ([place])   . Neuromuscular disorder ([place])   . OSA (obstructive sleep apnea)    has been on continuous positive airway pressure  . Seizure disorder ([place])   . Seizures ([place])   . Type II diabetes mellitus ([place])     Past Surgical History:  Procedure Laterality Date  . ABDOMINAL HYSTERECTOMY  1990   without BSO  . BACK SURGERY  1900s 2001   x2 with "cage put in"  . BILATERAL SALPINGOOPHORECTOMY  1990  . CARDIAC  CATHETERIZATION N/A 08/06/2015   Procedure: Left Heart Cath and Coronary Angiography;  Surgeon: Troy Sine, MD;  Location: [place] CV LAB;  Service: Cardiovascular;  Laterality: N/A;  . CARPAL TUNNEL RELEASE Bilateral   . CHOLECYSTECTOMY  1980  . COLONOSCOPY WITH PROPOFOL N/A 08/30/2017   Procedure: COLONOSCOPY WITH PROPOFOL;  Surgeon: Lin Landsman, MD;  Location[facility] ENDOSCOPY;  Service: Endoscopy;  Laterality: N/A;  . DORSAL COMPARTMENT RELEASE Left 10/25/2014   Procedure: LEFT FIRST  DORSAL COMPARTMENT RELEASE AND RADIAL TENOSYNOVECTOMY ;  Surgeon: Roseanne Kaufman, MD;  Location: [place];  Service: Orthopedics;  Laterality: Left;  . HAND SURGERY    . KNEE SURGERY Right 2012   meniscus tear  . KNEE SURGERY  2012  . LAMINECTOMY  1995  . TUBAL LIGATION  1982  . WRIST SURGERY Right     Current Medications: Outpatient Medications Prior to Visit  Medication Sig Dispense Refill  . baclofen (LIORESAL) 10 MG tablet Take 10 mg by mouth 3 (three) times daily as needed.    Marland Kitchen BOTOX 100 UNITS SOLR injection Inject into the muscle every 3 (three) months.     . chlorproMAZINE (THORAZINE) 25 MG tablet Take 25 mg by mouth 3 (three) times daily as needed.     . CYCLOBENZAPRINE HCL PO Take 1 tablet by mouth daily as needed.    . fluocinonide ointment (LIDEX) 2.53 % APPLY 1 APPLICATION TOPICALLY 2 (TWO) TIMES DAILY. 30 g 1  . fluticasone (FLONASE) 50 MCG/ACT nasal spray PLACE 2 SPRAYS INTO BOTH NOSTRILS DAILY. 48 g 3  . furosemide (LASIX) 40 MG tablet Take 40 mg by mouth daily as needed.    Marland Kitchen ketoconazole (NIZORAL) 2 % cream Apply 1 application topically daily as needed for irritation. 15 g 0  . lamoTRIgine (LAMICTAL) 100 MG tablet Take 100 mg by mouth 2 (two) times daily.    . Lancets (ONETOUCH ULTRASOFT) lancets Test fasting each morning and 2 hours before supper. Retest if having hypoglycemic symptoms. 100 each 12  . metFORMIN (GLUCOPHAGE) 500 MG tablet TAKE 2 TABLETS IN  THE MORNING, 1 TABLET AT LUNCH TIME AND 2 TABLETS IN THE EVENING 450 tablet 1  . ONETOUCH VERIO test strip TEST FASTING GLUCOSE DAILY AS DIRECTED 100 each 3  . TEGRETOL-XR 400 MG 12 hr tablet TAKE 1 TABLET BY MOUTH TWICE DAILY 60 tablet 4  . VIIBRYD 40 MG TABS TAKE 1 TABLET BY MOUTH EVERY DAY 30 tablet 3  . metoprolol tartrate (LOPRESSOR) 50 MG tablet Take 150 mg by mouth at bedtime.    . baclofen (LIORESAL) 10 MG tablet Take 1 tablet (10 mg total) by mouth 3 (three) times daily. (Patient taking differently: Take 10 mg by mouth 2 (two) times daily as needed (Headaches). ) 30 each 0  . ezetimibe-simvastatin (VYTORIN) 10-40  MG tablet TAKE 1 TABLET BY MOUTH AT BEDTIME. PLEASE SCHEDULE APPOINTMENT FOR REFILLS. 30 tablet 0  . furosemide (LASIX) 20 MG tablet Take 1 tablet (20 mg total) by mouth daily as needed for fluid or edema. 30 tablet 0  . levocetirizine (XYZAL) 5 MG tablet Take 1 tablet (5 mg total) by mouth every evening. 90 tablet 1  . metoprolol tartrate (LOPRESSOR) 50 MG tablet TAKE 1.5 TABLETS (75 MG TOTAL) BY MOUTH 2 (TWO) TIMES DAILY. NEED OV. 90 tablet 0  . potassium chloride (MICRO-K) 10 MEQ CR capsule Take 1 capsule (10 mEq total) by mouth daily as needed. (Patient taking differently: Take 10 mEq by mouth daily. ) 30 capsule 5  . triamcinolone cream (KENALOG) 0.1 % Apply 1 application topically 2 (two) times daily.     No facility-administered medications prior to visit.      Allergies:   Morphine; Morphine and related; Oxycodone; Oxycodone hcl; and Morphine sulfate   Social History   Socioeconomic History  . Marital status: Married    Spouse name: Not on file  . Number of children: 2  . Years of education: Not on file  . Highest education level: 12th grade  Occupational History  . Occupation: disabled  Social Needs  . Financial resource strain: Not hard at all  . Food insecurity:    Worry: Never true    Inability: Never true  . Transportation needs:    Medical: No     Non-medical: No  Tobacco Use  . Smoking status: Former Smoker    Types: Cigarettes    Last attempt to quit: 01/03/1983    Years since quitting: 34.7  . Smokeless tobacco: Never Used  . Tobacco comment: quit in 1984  Substance and Sexual Activity  . Alcohol use: No    Alcohol/week: 0.0 oz  . Drug use: No  . Sexual activity: Never  Lifestyle  . Physical activity:    Days per week: Not on file    Minutes per session: Not on file  . Stress: Rather much  Relationships  . Social connections:    Talks on phone: Not on file    Gets together: Not on file    Attends religious service: Not on file    Active member of club or organization: Not on file    Attends meetings of clubs or organizations: Not on file    Relationship status: Not on file  Other Topics Concern  . Not on file  Social History Narrative   ** Merged History Encounter **       She is a married mother of 2, grandmother 3. She tries to get exercise but is not doing any routine program.   She quit smoking over 25 years ago does not drink alcohol.     Family History:  The patient's family history includes Alzheimer's disease in her maternal grandmother; Dementia in her maternal grandmother; Diabetes in her maternal grandfather; Heart attack in her mother; Hypertension in her maternal grandfather; Sarcoidosis in her mother; Seizures in her mother.   ROS:   Please see the history of present illness.    ROS All other systems reviewed and are negative.   PHYSICAL EXAM:   VS:  BP (!) 144/86   Pulse 64   Ht 5\' 3"  (1.6 m)   Wt 217 lb (98.4 kg)   LMP  (LMP Unknown)   BMI 38.44 kg/m    GEN: Well nourished, well developed, in no acute distress  HEENT: normal  Neck: no JVD, carotid bruits, or masses Cardiac: RRR; no murmurs, rubs, or gallops,no edema  Respiratory:  clear to auscultation bilaterally, normal work of breathing GI: soft, nontender, nondistended, + BS MS: no deformity or atrophy  Skin: warm and dry, no  rash Neuro:  Alert and Oriented x 3, Strength and sensation are intact Psych: euthymic mood, full affect  Wt Readings from Last 3 Encounters:  09/20/17 217 lb (98.4 kg)  08/30/17 217 lb (98.4 kg)  07/25/17 218 lb 6.4 oz (99.1 kg)      Studies/Labs Reviewed:   EKG:  EKG is ordered today.  The ekg ordered today demonstrates normal sinus rhythm with nonspecific T wave changes  Recent Labs: 02/28/2017: ALT 16; Hemoglobin 11.6; Platelets 250; TSH 1.45 09/20/2017: BUN 20; Creatinine, Ser 1.01; Potassium 4.6; Sodium 142   Lipid Panel    Component Value Date/Time   CHOL 294 (H) 02/28/2017 1009   CHOL 289 (H) 01/10/2015 1009   CHOL 277 (H) 08/16/2014 0843   TRIG 168 (H) 02/28/2017 1009   TRIG 167 (H) 08/16/2014 0843   HDL 70 02/28/2017 1009   HDL 91 01/10/2015 1009   HDL 77 08/16/2014 0843   CHOLHDL 4.2 02/28/2017 1009   VLDL 33 08/16/2014 0843   LDLCALC 190 (H) 02/28/2017 1009   LDLCALC 167 (H) 08/16/2014 0843    Additional studies/ records that were reviewed today include:   Cath 08/06/2015 Conclusion    The left ventricular systolic function is normal.   Normal coronary arteries. Normal LV function with an ejection fraction of at least 60%.  RECOMMENDATION: The patient's stress test is a false positive study, most likely due to her morbid obesity and body habitus.  She has normal coronary arteries and normal LV function.  She is given preoperative clearance for her planned lumbar surgery to be done by Dr. Arnoldo Morale.      ASSESSMENT:    1. Leg swelling   2. Palpitation   3. Mixed hyperlipidemia   4. Essential hypertension   5. Controlled type 2 diabetes mellitus without complication, without long-term current use of insulin ([place])   6. OSA (obstructive sleep apnea)   7. Loud snoring   8. Dizziness      PLAN:  In order of problems listed above:  1. Leg swelling: She has been using 40 mg Lasix essentially on a daily basis instead of as needed since March of this  year.  Her lower extremity edema is not bad on physical exam, there is only a trace amount.  She is not taking potassium supplement with her Lasix.  I will check a basic metabolic panel today to make sure her renal function and electrolyte is okay.  2. Palpitation: She has not had any significant palpitation recently  3. Obstructive sleep apnea: She is due for split-night study.  She continued to have snoring  4. Hypertension: Blood pressure has been elevated, however she takes 150 mg metoprolol tartrate at night only, I asked her to split this medication to 75 mg twice daily to allow more even blood pressure control.  5. Hyperlipidemia: Previous cholesterol is severely uncontrolled, she could not tolerate Vytorin, I have switched her to Lipitor 40 mg daily.  She will need fasting lipid panel and liver function test in 2 months.  6. Dizziness: I will hold off on further evaluation of dizziness since we are adjusting her blood pressure medication during this office visit and also checking her for dehydration with basic metabolic panel.  Medication Adjustments/Labs and Tests Ordered: Current medicines are reviewed at length with the patient today.  Concerns regarding medicines are outlined above.  Medication changes, Labs and Tests ordered today are listed in the Patient Instructions below. Patient Instructions  Medication Instructions:  TAKE YOUR METOPROLOL TARTRATE 75MG  TWICE A DAY START Lipitor 40mg  Take 1 tablet once a day  Labwork: Your physician recommends that you return for lab work in: TODAY-BMET Your physician recommends that you return for lab work in: 2 MONTHS FASTING-LIPIDS, LFT  Testing/Procedures: Our Sleep Coordinator will contact you about your sleep study once she hears back from your insurance  Follow-Up: Your physician recommends that you schedule a follow-up appointment in: 4-5 months with Dr Claiborne Billings.   Any Other Special Instructions Will Be Listed Below (If  Applicable).  If you need a refill on your cardiac medications before your next appointment, please call your pharmacy.      Hilbert Corrigan, [place]  09/20/2017 5:43 PM    [facility] [place], [place], [place]  [address] Phone: [phone]; Fax: [phone]

## 2017-09-21 ENCOUNTER — Telehealth: Payer: Self-pay | Admitting: *Deleted

## 2017-09-21 NOTE — Telephone Encounter (Signed)
Patient notified of in lab sleep study scheduled for 10/19/17.

## 2017-09-21 NOTE — Telephone Encounter (Signed)
-----   Message from Corinna Lines sent at 09/20/2017 10:44 AM EDT ----- Regarding: sleep study   Sleep study

## 2017-10-01 ENCOUNTER — Other Ambulatory Visit: Payer: Self-pay | Admitting: Family Medicine

## 2017-10-03 NOTE — Telephone Encounter (Signed)
See refill request.

## 2017-10-05 DIAGNOSIS — M542 Cervicalgia: Secondary | ICD-10-CM | POA: Diagnosis not present

## 2017-10-05 DIAGNOSIS — G43719 Chronic migraine without aura, intractable, without status migrainosus: Secondary | ICD-10-CM | POA: Diagnosis not present

## 2017-10-05 DIAGNOSIS — G500 Trigeminal neuralgia: Secondary | ICD-10-CM | POA: Diagnosis not present

## 2017-10-05 DIAGNOSIS — G509 Disorder of trigeminal nerve, unspecified: Secondary | ICD-10-CM | POA: Diagnosis not present

## 2017-10-05 DIAGNOSIS — M791 Myalgia, unspecified site: Secondary | ICD-10-CM | POA: Diagnosis not present

## 2017-10-10 ENCOUNTER — Ambulatory Visit (INDEPENDENT_AMBULATORY_CARE_PROVIDER_SITE_OTHER): Payer: Medicare Other

## 2017-10-10 ENCOUNTER — Encounter: Payer: Self-pay | Admitting: Podiatry

## 2017-10-10 ENCOUNTER — Ambulatory Visit (INDEPENDENT_AMBULATORY_CARE_PROVIDER_SITE_OTHER): Payer: Medicare Other | Admitting: Podiatry

## 2017-10-10 DIAGNOSIS — M129 Arthropathy, unspecified: Secondary | ICD-10-CM

## 2017-10-10 DIAGNOSIS — M722 Plantar fascial fibromatosis: Secondary | ICD-10-CM | POA: Diagnosis not present

## 2017-10-10 DIAGNOSIS — M7662 Achilles tendinitis, left leg: Secondary | ICD-10-CM

## 2017-10-10 DIAGNOSIS — M7661 Achilles tendinitis, right leg: Secondary | ICD-10-CM

## 2017-10-10 MED ORDER — AMMONIUM LACTATE 12 % EX CREA: TOPICAL_CREAM | CUTANEOUS | 0 refills | Status: DC | PRN

## 2017-10-10 MED ORDER — MELOXICAM 15 MG PO TABS: 15.0000 mg | ORAL_TABLET | Freq: Every day | ORAL | 0 refills | Status: DC

## 2017-10-10 NOTE — Addendum Note (Signed)
Addended byDeidre Ala, Meda Dudzinski L on: 10/10/2017 01:08 PM   Modules accepted: Orders

## 2017-10-10 NOTE — Progress Notes (Signed)
This patient presents the office with chief complaint of pain at the areas where the Achilles tendon attaches to her heel bone.  She says she is having this problem in the back of her ankles for about 2-3 months.  She gives no history of trauma or injury to the foot.  He is concerned that the pain appears to be "going up the back of her leg.  She has provided no self treatment for this condition.  She presents the office today for an evaluation and treatment of both her painful feet.  General Appearance  Alert, conversant and in no acute stress.  Vascular  Dorsalis pedis and posterior tibial  pulses are palpable  bilaterally.  Capillary return is within normal limits  bilaterally. Temperature is within normal limits  bilaterally.  Neurologic  Senn-Weinstein monofilament wire test within normal limits  bilaterally. Muscle power within normal limits bilaterally.  Nail Normal nails noted with no evidence of bacterial or fungal infection.  Orthopedic  No limitations of motion of motion feet .  No crepitus or effusions noted.  No bony pathology or digital deformities noted.  Palpable pain noted over the Lisfranc's joint bilaterally.  Pain is elicited at the insertion of the Achilles tendon bilaterally.  There is a bony prominence noted retrocalcaneally  on the right foot. Pes planus  B/L.  Skin   skin with no porokeratosis noted bilaterally.  No signs of infections or ulcers noted.  Peeling skin heels  B/L  Insertional achilles tendinitis  B/L   Chronic arthritis  Midfoot  B/L  IE.  X-rays taken do reveal narrowing of the joints at the [facility] joint bilaterally.  Minimal calcification noted to the plantar fascia and the Achilles tendon insertions bilateral.Lac-hydrin was prescribed for skin.  Mobic 7.5 prescribed.  Powerstep insoles were recommended.  RTC 3 weeks for evaluation of foot.   Gardiner Barefoot DPM

## 2017-10-11 ENCOUNTER — Telehealth: Payer: Self-pay | Admitting: Cardiovascular Disease

## 2017-10-11 MED ORDER — AMLODIPINE BESYLATE 5 MG PO TABS: 5.0000 mg | ORAL_TABLET | Freq: Every day | ORAL | 3 refills | Status: DC

## 2017-10-11 NOTE — Telephone Encounter (Signed)
New Message   Pt c/o BP issue: STAT if pt c/o blurred vision, one-sided weakness or slurred speech  1. What are your last 5 BP readings? 179/102, 169/103, 180/97, 187/106 and 144/76  2. Are you having any other symptoms (ex. Dizziness, headache, blurred vision, passed out)? Headaches and fatigue  3. What is your BP issue? Pt states her blood pressure is higher than normal and wants to know what she sould do

## 2017-10-11 NOTE — Telephone Encounter (Signed)
If despite taking metoprolol 100 mg twice a day she is still hypertensive, can add amlodipine 5 mg daily.  Make sure the patient is using her CPAP therapy since untreated sleep apnea will increase her blood pressure.

## 2017-10-11 NOTE — Telephone Encounter (Signed)
Pt notified of Dr Evette Georges message, she states that she does not have a working CPAP machine and is going to take split night on 10-19-17.   Pt is asking: she went to the foot doctor and was given meloxicam 15mg  for the pain in her feet. Is this ok for her to take? Please advise

## 2017-10-11 NOTE — Telephone Encounter (Signed)
Returned call to pt she states that she [facility] an [facility] and her BP was 168/100's she states that she has started taking her metoprolol 75mg  BID and she further states that she has increased this to 100mg  BID and this has not helped with her BP it has been running 179/102, 169/103, 180/97, 187/106 and 144/76, she takes it BID. She c/o headache and fatigue and denies any dizziness. What should she do, do you want to change her medication? She will continue to log BP BID/sx and await any further direction.

## 2017-10-12 NOTE — Telephone Encounter (Signed)
Okay to take meloxicam proceed with split-night sleep study

## 2017-10-13 NOTE — Telephone Encounter (Signed)
PT NOTIFIED  

## 2017-10-15 ENCOUNTER — Other Ambulatory Visit: Payer: Self-pay | Admitting: Family Medicine

## 2017-10-15 DIAGNOSIS — F331 Major depressive disorder, recurrent, moderate: Secondary | ICD-10-CM

## 2017-10-18 ENCOUNTER — Ambulatory Visit: Payer: Medicare Other | Admitting: Gastroenterology

## 2017-10-19 ENCOUNTER — Encounter (HOSPITAL_BASED_OUTPATIENT_CLINIC_OR_DEPARTMENT_OTHER): Payer: Medicare Other

## 2017-10-31 ENCOUNTER — Ambulatory Visit (INDEPENDENT_AMBULATORY_CARE_PROVIDER_SITE_OTHER): Payer: Medicare Other | Admitting: Podiatry

## 2017-10-31 ENCOUNTER — Encounter: Payer: Self-pay | Admitting: Podiatry

## 2017-10-31 DIAGNOSIS — M722 Plantar fascial fibromatosis: Secondary | ICD-10-CM

## 2017-10-31 DIAGNOSIS — M7661 Achilles tendinitis, right leg: Secondary | ICD-10-CM | POA: Diagnosis not present

## 2017-10-31 DIAGNOSIS — M7662 Achilles tendinitis, left leg: Secondary | ICD-10-CM | POA: Diagnosis not present

## 2017-10-31 MED ORDER — MELOXICAM 15 MG PO TABS: 15.0000 mg | ORAL_TABLET | Freq: Every day | ORAL | 1 refills | Status: DC

## 2017-10-31 NOTE — Progress Notes (Signed)
This patient returns to the office follow-up for diagnosis of insertional Achilles tendinitis  Bilateral , as well as plantar fasciitis and Gershon Crane arthritis.  Patient was prescribed power step insoles for her to wear and was prescribed Mobic 7.5 mg to take daily.  She says that her foot is feeling better, but she still is having severe pain at the insertion of the Achilles tendon, especially the right foot.  She returns to the office today for continued evaluation and treatment of her painful feet.   General Appearance  Alert, conversant and in no acute stress.  Vascular  Dorsalis pedis and posterior tibial  pulses are palpable  bilaterally.  Capillary return is within normal limits  bilaterally. Temperature is within normal limits  bilaterally.  Neurologic  Senn-Weinstein monofilament wire test within normal limits  bilaterally. Muscle power within normal limits bilaterally.  Nails normal nails noted with no evidence of bacterial or fungal infection.  Orthopedic  No limitations of motion of motion feet .  No crepitus or effusions noted.  No bony pathology or digital deformities noted. Pain over Lisfranc's joint bilaterally has diminished.  Pain at the insertion of the plantar fascia has diminished.  Pain at the insertion of the Achilles tendon of the right foot is continued to be severely painful.  Skin  normotropic skin with no porokeratosis noted bilaterally.  No signs of infections or ulcers noted.    Achilles tendinitis  B/l  ROV.  told the patient to try stretching exercises for her Achilles, especially upon rising in the morning.  Prescribed Mobic 15 mg with instructions to take one daily.  Watch patient leave and there is no evidence of an antalgic gait.  RTC 2 weeks   Gardiner Barefoot DPM

## 2017-11-01 ENCOUNTER — Other Ambulatory Visit: Payer: Self-pay | Admitting: Family Medicine

## 2017-11-08 ENCOUNTER — Ambulatory Visit (HOSPITAL_BASED_OUTPATIENT_CLINIC_OR_DEPARTMENT_OTHER): Payer: Medicare Other | Attending: Physician Assistant | Admitting: Cardiovascular Disease

## 2017-11-08 VITALS — Ht 63.0 in | Wt 218.0 lb

## 2017-11-08 DIAGNOSIS — G4733 Obstructive sleep apnea (adult) (pediatric): Secondary | ICD-10-CM | POA: Diagnosis not present

## 2017-11-14 ENCOUNTER — Encounter: Payer: Self-pay | Admitting: Podiatry

## 2017-11-14 ENCOUNTER — Ambulatory Visit (INDEPENDENT_AMBULATORY_CARE_PROVIDER_SITE_OTHER): Payer: Medicare Other | Admitting: Podiatry

## 2017-11-14 DIAGNOSIS — M7661 Achilles tendinitis, right leg: Secondary | ICD-10-CM | POA: Diagnosis not present

## 2017-11-14 DIAGNOSIS — M7662 Achilles tendinitis, left leg: Secondary | ICD-10-CM

## 2017-11-14 DIAGNOSIS — E119 Type 2 diabetes mellitus without complications: Secondary | ICD-10-CM | POA: Diagnosis not present

## 2017-11-14 DIAGNOSIS — M129 Arthropathy, unspecified: Secondary | ICD-10-CM | POA: Diagnosis not present

## 2017-11-14 NOTE — Progress Notes (Signed)
This patient returns to the office follow-up for diagnosis of insertional Achilles tendinitis  Bilateral , as well as plantar fasciitis and Gershon Crane arthritis.  She states that her left foot has impproved taking the Mobic by mouth.  She says she is still experiencing pain at the insertion of the Achilles tendon, right foot.  She says the medicine by mouth is not helping and her pain is 10 out of 10 in the right foot.  She presents the office today for continued evaluation and treatment.   General Appearance  Alert, conversant and in no acute stress.  Vascular  Dorsalis pedis and posterior tibial  pulses are palpable  bilaterally.  Capillary return is within normal limits  bilaterally. Temperature is within normal limits  bilaterally.  Neurologic  Senn-Weinstein monofilament wire test within normal limits  bilaterally. Muscle power within normal limits bilaterally.  Nails normal nails noted with no evidence of bacterial or fungal infection.  Orthopedic  No limitations of motion of motion feet .  No crepitus or effusions noted.  No bony pathology or digital deformities noted. Pain over Lisfranc's joint bilaterally has diminished.  Pain at the insertion of the plantar fascia has diminished.  Pain at the insertion of the Achilles tendon of the right foot is continued to be severely painful.  Skin  normotropic skin with no porokeratosis noted bilaterally.  No signs of infections or ulcers noted.    Achilles tendinitis  B/l  ROV.  Patient was told to continue taking her Mobic daily.  She was also dispensed a cam walker in an effort to immobilize her right foot and eliminate her Achilles tendinitis pain .  She is accompanied by a gentleman who questions if physical therapy can be considered.  I told him that physical therapy is one of the future modalities to consider if her pain continues.  RTC 2 weeks.   Gardiner Barefoot DPM

## 2017-11-15 DIAGNOSIS — M791 Myalgia, unspecified site: Secondary | ICD-10-CM | POA: Diagnosis not present

## 2017-11-15 DIAGNOSIS — M542 Cervicalgia: Secondary | ICD-10-CM | POA: Diagnosis not present

## 2017-11-15 DIAGNOSIS — G43719 Chronic migraine without aura, intractable, without status migrainosus: Secondary | ICD-10-CM | POA: Diagnosis not present

## 2017-11-15 DIAGNOSIS — G518 Other disorders of facial nerve: Secondary | ICD-10-CM | POA: Diagnosis not present

## 2017-11-19 ENCOUNTER — Encounter (HOSPITAL_BASED_OUTPATIENT_CLINIC_OR_DEPARTMENT_OTHER): Payer: Self-pay | Admitting: Cardiovascular Disease

## 2017-11-19 NOTE — Procedures (Signed)
Patient Name: Abigail Figueroa, Abigail Figueroa Date: 11/08/2017 Gender: Female D.O.B: 22-Oct-1960 Age (years): 41 Referring Provider: Shelva Majestic MD, ABSM Height (inches): 63 Interpreting Physician: Shelva Majestic MD, ABSM Weight (lbs): 218 RPSGT: Laren Everts BMI: 39 MRN: [id number] Neck Size: 15.50  CLINICAL INFORMATION Sleep Study Type: Split Night CPAP  Indication for sleep study: Congestive Heart Failure, Diabetes, Excessive Daytime Sleepiness, Fatigue, Hypertension, Morning Headaches, Obesity, OSA, Re-Evaluation, Snoring, Witnessed Apneas  Epworth Sleepiness Score: 16  SLEEP STUDY TECHNIQUE As per the AASM Manual for the Scoring of Sleep and Associated Events v2.3 (April 2016) with a hypopnea requiring 4% desaturations.  The channels recorded and monitored were frontal, central and occipital EEG, electrooculogram (EOG), submentalis EMG (chin), nasal and oral airflow, thoracic and abdominal wall motion, anterior tibialis EMG, snore microphone, electrocardiogram, and pulse oximetry. Continuous positive airway pressure (CPAP) was initiated when the patient met split night criteria and was titrated according to treat sleep-disordered breathing.  MEDICATIONS     amLODipine (NORVASC) 5 MG tablet         ammonium lactate (LAC-HYDRIN) 12 % cream         atorvastatin (LIPITOR) 40 MG tablet         baclofen (LIORESAL) 10 MG tablet         BOTOX 100 UNITS SOLR injection         chlorproMAZINE (THORAZINE) 25 MG tablet         CYCLOBENZAPRINE HCL PO         fluocinonide ointment (LIDEX) 0.05 %         fluticasone (FLONASE) 50 MCG/ACT nasal spray         furosemide (LASIX) 20 MG tablet         ketoconazole (NIZORAL) 2 % cream         lamoTRIgine (LAMICTAL) 100 MG tablet         Lancets (ONETOUCH ULTRASOFT) lancets         meloxicam (MOBIC) 15 MG tablet         meloxicam (MOBIC) 15 MG tablet         metFORMIN (GLUCOPHAGE) 500 MG tablet         metoprolol tartrate (LOPRESSOR) 50 MG  tablet (Expired)         ONETOUCH VERIO test strip         TEGRETOL-XR 400 MG 12 hr tablet         VIIBRYD 40 MG TABS      Medications self-administered by patient taken the night of the study : ATORVASTATIN, LAMOTRIGINE, MELOXICAM, METOPROLOL TARTRATE, METFORMIN, TEGRETOL XR, viibryd  RESPIRATORY PARAMETERS Diagnostic Total AHI (/hr): 30.5 RDI (/hr): 42.2 OA Index (/hr): 16.4 CA Index (/hr): 0.0 REM AHI (/hr): 76.1 NREM AHI (/hr): 21.8 Supine AHI (/hr): N/A Non-supine AHI (/hr): 30.47 Min O2 Sat (%): 71.0 Mean O2 (%): 92.5 Time below 88% (min): 4.2   Titration Optimal Pressure (cm): 11 AHI at Optimal Pressure (/hr): 0.0 Min O2 at Optimal Pressure (%): 93.0 Supine % at Optimal (%): 0 Sleep % at Optimal (%): 95   SLEEP ARCHITECTURE The recording time for the entire night was 370.2 minutes.  During a baseline period of 149.1 minutes, the patient slept for 128.0 minutes in REM and nonREM, yielding a sleep efficiency of 85.8%%. Sleep onset after lights out was 6.0 minutes with a REM latency of 44.0 minutes. The patient spent 9.0%% of the night in stage N1 sleep, 75.0%% in stage N2 sleep, 0.0%% in stage  N3 and 16.0%% in REM.  During the titration period of 211.3 minutes, the patient slept for 193.0 minutes in REM and nonREM, yielding a sleep efficiency of 91.3%%. Sleep onset after CPAP initiation was 5.6 minutes with a REM latency of 1.5 minutes. The patient spent 6.5%% of the night in stage N1 sleep, 59.3%% in stage N2 sleep, 0.0%% in stage N3 and 34.2%% in REM.  CARDIAC DATA The 2 lead EKG demonstrated sinus rhythm. The mean heart rate was 100.0 beats per minute. Other EKG findings include: None. LEG MOVEMENT DATA The total Periodic Limb Movements of Sleep (PLMS) were 0. The PLMS index was 0.0 .  IMPRESSIONS - Severe obstructive sleep apnea occurred during the diagnostic portion of the study (AHI 30.5/h; RDI 42.5/h). Events were more severe during REM sleep (AHI 76.1/h).  An optimal PAP  pressure was selected for this patient ( 11 cm of water) - No significant central sleep apnea occurred during the diagnostic portion of the study (CAI = 0.0/hour). - Significant  oxygen desaturation to a nadir of 71%. - The patient snored with loud snoring volume during the diagnostic portion of the study. - No cardiac abnormalities were noted during this study. - Clinically significant periodic limb movements did not occur during sleep.  DIAGNOSIS  - Obstructive Sleep Apnea (327.23 [G47.33 ICD-10])  RECOMMENDATIONS - Recommend an initial trial of CPAP therapy with EPR at 11 cm H2O with heated humidification. A Medium Wide size Philips Respironics Full Face Mask Dreamwear mask was used for the titration. - Effort should be made top optimize nasal and oropharyngeal patency. - Avoid alcohol, sedatives and other CNS depressants that may worsen sleep apnea and disrupt normal sleep architecture. - Sleep hygiene should be reviewed to assess factors that may improve sleep quality. - Weight management and regular exercise should be initiated or continued. - Recommend a download be obtained in 30 days and sleep clinic evaluation after 4 weeks of therapy.  [Electronically signed] 11/19/2017 04:54 PM  Shelva Majestic MD, [facility], ABSM Diplomate, American Board of Sleep Medicine  NPI: [id number] [place] PH: [phone]   FX: [phone] [place]

## 2017-11-21 ENCOUNTER — Telehealth: Payer: Self-pay | Admitting: *Deleted

## 2017-11-21 ENCOUNTER — Other Ambulatory Visit: Payer: Self-pay | Admitting: *Deleted

## 2017-11-21 NOTE — Telephone Encounter (Signed)
Patient notified of sleep study results and recommendations. CPAP referral sent to [facility].

## 2017-11-22 NOTE — Progress Notes (Signed)
Patient notified. Referral sent to [facility].

## 2017-11-25 ENCOUNTER — Ambulatory Visit: Payer: Medicare Other | Admitting: Gastroenterology

## 2017-11-25 ENCOUNTER — Ambulatory Visit (INDEPENDENT_AMBULATORY_CARE_PROVIDER_SITE_OTHER): Payer: Medicare Other | Admitting: Gastroenterology

## 2017-11-25 ENCOUNTER — Other Ambulatory Visit: Payer: Self-pay

## 2017-11-25 ENCOUNTER — Encounter: Payer: Self-pay | Admitting: Gastroenterology

## 2017-11-25 VITALS — BP 148/83 | HR 75 | Resp 17 | Ht 63.0 in | Wt 218.0 lb

## 2017-11-25 DIAGNOSIS — K529 Noninfective gastroenteritis and colitis, unspecified: Secondary | ICD-10-CM | POA: Diagnosis not present

## 2017-11-25 MED ORDER — LOPERAMIDE HCL 2 MG PO TABS: 2.0000 mg | ORAL_TABLET | Freq: Four times a day (QID) | ORAL | 0 refills | Status: DC | PRN

## 2017-11-25 MED ORDER — AMITRIPTYLINE HCL 25 MG PO TABS: 25.0000 mg | ORAL_TABLET | Freq: Every day | ORAL | 0 refills | Status: DC

## 2017-11-25 NOTE — Progress Notes (Signed)
Cephas Darby, MD [address]  [place], [place] [address]  Main: [phone]  Fax: [phone]    Gastroenterology Consultation  Referring Provider:     Margo Common, PA Primary Care Physician:  Margo Common, PA Primary Gastroenterologist:  Dr. Cephas Darby Reason for Consultation:     Chronic diarrhea        HPI:   Abigail Figueroa is a 57 y.o. female referred by Dr. Margo Common, PA  for consultation & management of chronic diarrhea. Patient reports having had a cholecystectomy approximately 25 years ago. Her bowel movements have always been loose. However, for the last 1 year she reports having several episodes of small frequent watery bowel movements mostly during the day, 2-3 times most of the nights. She does report 2-3 episodes of fecal incontinence as well. She denies weight loss. In fact she thinks might be gaining weight. She denies abdominal pain, has intermittent bloating and reports foul-smelling stools, a lot of flatulence. She drinks coffee daily and was adding splenda, recently switched to Stevia 6 months ago along with creamer. She denies drinking sodas, consumes red meat. She has not tried any over-the-counter antidiarrheal medications. I performed her colonoscopy in 08/2017 for colon cancer screening and random colon biopsies came back negative for inflammatory bowel disease or microscopic colitis Patient denies taking any new medications. She is on amlodipine for the last 6 months. She is on metformin for a long time. Her hemoglobin A1c is gradually improving.  She stays at home, on disability, reports that she stresses out very easily.  NSAIDs: none  Antiplts/Anticoagulants/Anti thrombotics: None  GI Procedures:  Colonoscopy 08/30/2017 The perianal and digital rectal examinations were normal. Pertinent negatives include normal sphincter tone and no palpable rectal lesions. A diminutive polyp was found in the transverse  colon. The polyp was sessile. The polyp was removed with a cold snare. Resection and retrieval were complete. External hemorrhoids were found during retroflexion. The hemorrhoids were medium-sized. Normal mucosa was found in the entire colon. Biopsies for histology were taken with a cold forceps from the entire colon for evaluation of microscopic colitis. DIAGNOSIS:  A. COLON; RANDOM COLD BIOPSY:  - NEGATIVE FOR COLITIS AND DYSPLASIA.   D. COLON POLYP, TRANSVERSE; COLD SNARE:  - TUBULAR ADENOMA.  - NEGATIVE FOR HIGH-GRADE DYSPLASIA AND MALIGNANCY.   Past Medical History:  Diagnosis Date  . Abnormal stress test    a. 10/2009: Normal myocardial perfusion imaging; b. 08/2015 MV: medium defect of mod severity in mid ant apical region w/ mild HK of the distal inf wall;  c. 08/2015 Cath: nl Cors, EF 60%.  . Anemia   . Anxiety   . Arthritis   . Bell's palsy   . Depression   . Edema   . Essential hypertension   . Frequent urination   . Frequent urination at night   . Headache    migraines, gets botox injections  . Heart palpitations June 2013   Event monitor showing sinus tachycardia, PACs with couplets and triplets.  Marland Kitchen Hx of echocardiogram    a. 10/2009 Echo: showed normal left ventricular function, mild left ventricular hypertrophy, and no significant valve abnormalities.  . Hypercholesterolemia   . Migraine   . Morbid obesity ([place])   . Neuromuscular disorder ([place])   . OSA (obstructive sleep apnea)    has been on continuous positive airway pressure  . Seizure disorder ([place])   . Seizures ([place])   .  Type II diabetes mellitus ([place])     Past Surgical History:  Procedure Laterality Date  . ABDOMINAL HYSTERECTOMY  1990   without BSO  . BACK SURGERY  1900s 2001   x2 with "cage put in"  . BILATERAL SALPINGOOPHORECTOMY  1990  . CARDIAC CATHETERIZATION N/A 08/06/2015   Procedure: Left Heart Cath and Coronary Angiography;  Surgeon: Troy Sine, MD;  Location: [place] CV [facility];   Service: Cardiovascular;  Laterality: N/A;  . CARPAL TUNNEL RELEASE Bilateral   . CHOLECYSTECTOMY  1980  . COLONOSCOPY WITH PROPOFOL N/A 08/30/2017   Procedure: COLONOSCOPY WITH PROPOFOL;  Surgeon: Lin Landsman, MD;  Location: [facility] ENDOSCOPY;  Service: Endoscopy;  Laterality: N/A;  . DORSAL COMPARTMENT RELEASE Left 10/25/2014   Procedure: LEFT FIRST  DORSAL COMPARTMENT RELEASE AND RADIAL TENOSYNOVECTOMY ;  Surgeon: Roseanne Kaufman, MD;  Location: [place];  Service: Orthopedics;  Laterality: Left;  . HAND SURGERY    . KNEE SURGERY Right 2012   meniscus tear  . KNEE SURGERY  2012  . LAMINECTOMY  1995  . TUBAL LIGATION  1982  . WRIST SURGERY Right     Current Outpatient Medications:  .  amLODipine (NORVASC) 5 MG tablet, Take 1 tablet (5 mg total) by mouth daily., Disp: 30 tablet, Rfl: 3 .  ammonium lactate (LAC-HYDRIN) 12 % cream, Apply topically as needed for dry skin., Disp: 385 g, Rfl: 0 .  atorvastatin (LIPITOR) 40 MG tablet, Take 1 tablet (40 mg total) by mouth daily., Disp: 30 tablet, Rfl: 6 .  baclofen (LIORESAL) 10 MG tablet, Take 10 mg by mouth 3 (three) times daily as needed., Disp: , Rfl:  .  BOTOX 100 UNITS SOLR injection, Inject into the muscle every 3 (three) months. , Disp: , Rfl:  .  chlorproMAZINE (THORAZINE) 25 MG tablet, Take 25 mg by mouth 3 (three) times daily as needed. , Disp: , Rfl:  .  CYCLOBENZAPRINE HCL PO, Take 1 tablet by mouth daily as needed., Disp: , Rfl:  .  fluocinonide ointment (LIDEX) 2.29 %, APPLY 1 APPLICATION TOPICALLY 2 (TWO) TIMES DAILY., Disp: 30 g, Rfl: 1 .  fluticasone (FLONASE) 50 MCG/ACT nasal spray, PLACE 2 SPRAYS INTO BOTH NOSTRILS DAILY., Disp: 48 g, Rfl: 3 .  furosemide (LASIX) 20 MG tablet, Take by mouth., Disp: , Rfl:  .  lamoTRIgine (LAMICTAL) 100 MG tablet, Take 100 mg by mouth 2 (two) times daily., Disp: , Rfl:  .  Lancets (ONETOUCH ULTRASOFT) lancets, Test fasting each morning and 2 hours before supper. Retest if  having hypoglycemic symptoms., Disp: 100 each, Rfl: 12 .  levocetirizine (XYZAL) 5 MG tablet, every evening., Disp: , Rfl: 1 .  meloxicam (MOBIC) 15 MG tablet, Take 1 tablet (15 mg total) by mouth daily., Disp: 30 tablet, Rfl: 0 .  metFORMIN (GLUCOPHAGE) 500 MG tablet, TAKE 2 TABLETS BY MOUTH IN THE MORNING ,1 TABLET AT LUNCH, & TAKE 2 TABLETS IN THE EVENING, Disp: 450 tablet, Rfl: 1 .  ONETOUCH VERIO test strip, TEST FASTING GLUCOSE DAILY AS DIRECTED, Disp: 100 each, Rfl: 3 .  TEGRETOL-XR 400 MG 12 hr tablet, TAKE 1 TABLET BY MOUTH TWICE DAILY, Disp: 60 tablet, Rfl: 4 .  VIIBRYD 40 MG TABS, TAKE 1 TABLET BY MOUTH EVERY DAY, Disp: 30 tablet, Rfl: 3 .  amitriptyline (ELAVIL) 25 MG tablet, Take 1 tablet (25 mg total) by mouth at bedtime., Disp: 30 tablet, Rfl: 0 .  ketoconazole (NIZORAL) 2 % cream, Apply 1 application  topically daily as needed for irritation. (Patient not taking: Reported on 11/25/2017), Disp: 15 g, Rfl: 0 .  loperamide (IMODIUM A-D) 2 MG tablet, Take 1 tablet (2 mg total) by mouth 4 (four) times daily as needed for diarrhea or loose stools., Disp: 30 tablet, Rfl: 0 .  meloxicam (MOBIC) 15 MG tablet, Take 1 tablet (15 mg total) by mouth daily. (Patient not taking: Reported on 11/25/2017), Disp: 30 tablet, Rfl: 1 .  metoprolol tartrate (LOPRESSOR) 50 MG tablet, Take 1.5 tablets (75 mg total) by mouth 2 (two) times daily., Disp: 90 tablet, Rfl: 6   Family History  Problem Relation Age of Onset  . Sarcoidosis Mother   . Seizures Mother   . Heart attack Mother   . Alzheimer's disease Maternal Grandmother   . Dementia Maternal Grandmother   . Hypertension Maternal Grandfather   . Diabetes Maternal Grandfather   . Breast cancer Neg Hx      Social History   Tobacco Use  . Smoking status: Former Smoker    Types: Cigarettes    Last attempt to quit: 01/03/1983    Years since quitting: 34.9  . Smokeless tobacco: Never Used  . Tobacco comment: quit in 1984  Substance Use Topics  .  Alcohol use: No    Alcohol/week: 0.0 oz  . Drug use: No    Allergies as of 11/25/2017 - Review Complete 11/25/2017  Allergen Reaction Noted  . Morphine Rash and Swelling 09/05/2012  . Morphine and related Hives 10/14/2014  . Oxycodone Hives, Itching, Rash, and Swelling 09/05/2012  . Oxycodone hcl Swelling, Hives, and Itching 08/09/2013  . Morphine sulfate Itching and Nausea And Vomiting 04/19/2014    Review of Systems:    All systems reviewed and negative except where noted in HPI.   Physical Exam:  BP (!) 148/83 (BP Location: Left Arm, Patient Position: Sitting, Cuff Size: Large)   Pulse 75   Resp 17   Ht 5\' 3"  (1.6 m)   Wt 218 lb (98.9 kg)   LMP  (LMP Unknown)   BMI 38.62 kg/m  No LMP recorded (lmp unknown). Patient has had a hysterectomy.  General:   Alert,  Well-developed, well-nourished, pleasant and cooperative in NAD Head:  Normocephalic and atraumatic. Eyes:  Sclera clear, no icterus.   Conjunctiva pink. Ears:  Normal auditory acuity. Nose:  No deformity, discharge, or lesions. Mouth:  No deformity or lesions,oropharynx pink & moist. Neck:  Supple; no masses or thyromegaly. Lungs:  Respirations even and unlabored.  Clear throughout to auscultation.   No wheezes, crackles, or rhonchi. No acute distress. Heart:  Regular rate and rhythm; no murmurs, clicks, rubs, or gallops. Abdomen:  Normal bowel sounds. Soft, non-tender, obese and non-distended without masses, hepatosplenomegaly or hernias noted.  No guarding or rebound tenderness.   Rectal: Not performed Msk:  Symmetrical without gross deformities. Good, equal movement & strength bilaterally. Pulses:  Normal pulses noted. Extremities:  No clubbing or edema.  No cyanosis. Neurologic:  Alert and oriented x3;  grossly normal neurologically. Skin:  Intact without significant lesions or rashes. No jaundice. Lymph Nodes:  No significant cervical adenopathy. Psych:  Alert and cooperative. Normal mood and  affect.  Imaging Studies: No abdominal imaging  Assessment and Plan:   Abigail Figueroa is a 57 y.o. African-American female with metabolic syndrome, status post cholecystectomy several years ago, chronically has been experiencing increased bowel frequency, worse in last 1 year associated with episodes of fecal incontinence and nocturnal diarrhea. Colonoscopy unremarkable including  random colon biopsies.  Discussed with her about pursuing additional workup of chronic diarrhea. Regular intake of artificial sweeteners which is probably contributing to her diarrhea from sucrose intolerance  - eliminated all the artificial sweeteners from her diet, avoid creamers - Stool studies to rule out infection - check fecal calprotectin - Check pancreatic fecal elastase - Check H. Pylori stool antigen - start low-dose amitriptyline 25 mg at bedtime - Imodium as needed   Follow up in 2 weeks   Cephas Darby, MD

## 2017-11-28 ENCOUNTER — Encounter: Payer: Self-pay | Admitting: Podiatry

## 2017-11-28 ENCOUNTER — Ambulatory Visit (INDEPENDENT_AMBULATORY_CARE_PROVIDER_SITE_OTHER): Payer: Medicare Other | Admitting: Podiatry

## 2017-11-28 DIAGNOSIS — E119 Type 2 diabetes mellitus without complications: Secondary | ICD-10-CM | POA: Diagnosis not present

## 2017-11-28 DIAGNOSIS — M7662 Achilles tendinitis, left leg: Secondary | ICD-10-CM | POA: Diagnosis not present

## 2017-11-28 DIAGNOSIS — M7661 Achilles tendinitis, right leg: Secondary | ICD-10-CM

## 2017-11-28 DIAGNOSIS — M722 Plantar fascial fibromatosis: Secondary | ICD-10-CM | POA: Diagnosis not present

## 2017-11-28 NOTE — Progress Notes (Signed)
This patient returns to the office for ontinued evaluation and treatment of her plantar fasciitis and Achilles tendinitis.  She says that her left foot is improving but she still experiencing pain and discomfort upon rising in the morning in the back of her right heel.  She has been treated with power step insoles Mobic and a Cam Walker.  She says that her right heel is 30-40% improved but she desires further treatment of this condition.   General Appearance  Alert, conversant and in no acute stress.  Vascular  Dorsalis pedis and posterior tibial  pulses are palpable  bilaterally.  Capillary return is within normal limits  bilaterally. Temperature is within normal limits  bilaterally.  Neurologic  Senn-Weinstein monofilament wire test within normal limits  bilaterally. Muscle power within normal limits bilaterally.  Nails Thick disfigured discolored nails with subungual debris  from hallux to fifth toes bilaterally. No evidence of bacterial infection or drainage bilaterally.  Orthopedic  No limitations of motion of motion feet .  No crepitus or effusions noted.  Pain persists along the course of the plantar fascia bilaterally as well as the insertion of the Achilles tendon, right foot.      Skin  normotropic skin with no porokeratosis noted bilaterally.  No signs of infections or ulcers noted.    Plantar fasciitis bilaterally.  Achilles tendinitis bilaterally.  ROV.  Discussed this condition with this patient.  We decided to send her to physical therapy for 3-4 weeks and to follow-up with Dr. Amalia Hailey.   Gardiner Barefoot DPM

## 2017-12-09 ENCOUNTER — Ambulatory Visit: Payer: Medicare Other | Admitting: Gastroenterology

## 2017-12-09 DIAGNOSIS — G4733 Obstructive sleep apnea (adult) (pediatric): Secondary | ICD-10-CM | POA: Diagnosis not present

## 2017-12-17 ENCOUNTER — Other Ambulatory Visit: Payer: Self-pay | Admitting: Gastroenterology

## 2017-12-27 ENCOUNTER — Other Ambulatory Visit: Payer: Self-pay

## 2017-12-27 DIAGNOSIS — M791 Myalgia, unspecified site: Secondary | ICD-10-CM | POA: Diagnosis not present

## 2017-12-27 DIAGNOSIS — G43719 Chronic migraine without aura, intractable, without status migrainosus: Secondary | ICD-10-CM | POA: Diagnosis not present

## 2017-12-27 DIAGNOSIS — M542 Cervicalgia: Secondary | ICD-10-CM | POA: Diagnosis not present

## 2017-12-27 MED ORDER — AMITRIPTYLINE HCL 25 MG PO TABS: 25.0000 mg | ORAL_TABLET | Freq: Every day | ORAL | 0 refills | Status: DC

## 2017-12-28 DIAGNOSIS — M19049 Primary osteoarthritis, unspecified hand: Secondary | ICD-10-CM | POA: Insufficient documentation

## 2017-12-28 DIAGNOSIS — M79642 Pain in left hand: Secondary | ICD-10-CM | POA: Diagnosis not present

## 2017-12-28 DIAGNOSIS — M1812 Unilateral primary osteoarthritis of first carpometacarpal joint, left hand: Secondary | ICD-10-CM | POA: Diagnosis not present

## 2017-12-30 ENCOUNTER — Ambulatory Visit: Payer: Medicare Other | Admitting: Podiatry

## 2018-01-09 ENCOUNTER — Ambulatory Visit: Payer: Medicare Other | Admitting: Gastroenterology

## 2018-01-09 DIAGNOSIS — G4733 Obstructive sleep apnea (adult) (pediatric): Secondary | ICD-10-CM | POA: Diagnosis not present

## 2018-01-11 ENCOUNTER — Other Ambulatory Visit: Payer: Self-pay | Admitting: Podiatry

## 2018-01-12 ENCOUNTER — Ambulatory Visit: Payer: Self-pay | Admitting: Family Medicine

## 2018-01-17 ENCOUNTER — Ambulatory Visit (INDEPENDENT_AMBULATORY_CARE_PROVIDER_SITE_OTHER): Payer: Medicare Other | Admitting: Family Medicine

## 2018-01-17 ENCOUNTER — Encounter: Payer: Self-pay | Admitting: Family Medicine

## 2018-01-17 VITALS — BP 140/80 | HR 63 | Temp 97.8°F | Resp 16 | Wt 216.0 lb

## 2018-01-17 DIAGNOSIS — L309 Dermatitis, unspecified: Secondary | ICD-10-CM

## 2018-01-17 DIAGNOSIS — M159 Polyosteoarthritis, unspecified: Secondary | ICD-10-CM

## 2018-01-17 DIAGNOSIS — Z23 Encounter for immunization: Secondary | ICD-10-CM | POA: Diagnosis not present

## 2018-01-17 NOTE — Progress Notes (Signed)
Patient: Abigail Figueroa Female    DOB: [birth year]   57 y.o.   MRN: [id number] Visit Date: 01/17/2018  Today's Provider: Vernie Murders, PA   Chief Complaint  Patient presents with  . Rash  . Arthritis   Subjective:    HPI Patient here today with c/o rash. She reports that since she started using the CPAP she has been breaking out. Reports that she has tried head a shoulder shampoo.  Patient c/o arthritis. She reports that the medicine she was prescribed have a lot of side effect and wants to know if is ok for her to take and also requesting a referral to an arthritis doctor.She reports that she has questions about the Meloxicam.     Past Medical History:  Diagnosis Date  . Abnormal stress test    a. 10/2009: Normal myocardial perfusion imaging; b. 08/2015 MV: medium defect of mod severity in mid ant apical region w/ mild HK of the distal inf wall;  c. 08/2015 Cath: nl Cors, EF 60%.  . Anemia   . Anxiety   . Arthritis   . Bell's palsy   . Depression   . Edema   . Essential hypertension   . Frequent urination   . Frequent urination at night   . Headache    migraines, gets botox injections  . Heart palpitations June 2013   Event monitor showing sinus tachycardia, PACs with couplets and triplets.  Marland Kitchen Hx of echocardiogram    a. 10/2009 Echo: showed normal left ventricular function, mild left ventricular hypertrophy, and no significant valve abnormalities.  . Hypercholesterolemia   . Migraine   . Morbid obesity ([place])   . Neuromuscular disorder ([place])   . OSA (obstructive sleep apnea)    has been on continuous positive airway pressure  . Seizure disorder ([place])   . Seizures ([place])   . Type II diabetes mellitus ([place])    Past Surgical History:  Procedure Laterality Date  . ABDOMINAL HYSTERECTOMY  1990   without BSO  . BACK SURGERY  1900s 2001   x2 with "cage put in"  . BILATERAL SALPINGOOPHORECTOMY  1990  . CARDIAC CATHETERIZATION N/A 08/06/2015   Procedure: Left Heart  Cath and Coronary Angiography;  Surgeon: Troy Sine, MD;  Location: [facility];  Service: Cardiovascular;  Laterality: N/A;  . CARPAL TUNNEL RELEASE Bilateral   . CHOLECYSTECTOMY  1980  . COLONOSCOPY WITH PROPOFOL N/A 08/30/2017   Procedure: COLONOSCOPY WITH PROPOFOL;  Surgeon: Lin Landsman, MD;  Location[facility] END[facility];  Service: Endoscopy;  Laterality: N/A;  . DORSAL COMPARTMENT RELEASE Left 10/25/2014   Procedure: LEFT FIRST  DORSAL COMPARTMENT RELEASE AND RADIAL TENOSYNOVECTOMY ;  Surgeon: Roseanne Kaufman, MD;  Location: [facility];  Service: Orthopedics;  Laterality: Left;  . HAND SURGERY    . KNEE SURGERY Right 2012   meniscus tear  . KNEE SURGERY  2012  . LAMINECTOMY  1995  . TUBAL LIGATION  1982  . WRIST SURGERY Right    Family History  Problem Relation Age of Onset  . Sarcoidosis Mother   . Seizures Mother   . Heart attack Mother   . Alzheimer's disease Maternal Grandmother   . Dementia Maternal Grandmother   . Hypertension Maternal Grandfather   . Diabetes Maternal Grandfather   . Breast cancer Neg Hx    Allergies  Allergen Reactions  . Morphine Rash and Swelling  . Morphine And Related Hives  Nausea and vomiting  Nausea and vomiting  . Oxycodone Hives, Itching, Rash and Swelling  . Oxycodone Hcl Swelling, Hives and Itching  . Morphine Sulfate Itching and Nausea And Vomiting    REACTION: swelling    Current Outpatient Medications:  .  ammonium lactate (LAC-HYDRIN) 12 % cream, Apply topically as needed for dry skin., Disp: 385 g, Rfl: 0 .  atorvastatin (LIPITOR) 40 MG tablet, Take 1 tablet (40 mg total) by mouth daily., Disp: 30 tablet, Rfl: 6 .  BOTOX 100 UNITS SOLR injection, Inject into the muscle every 3 (three) months. , Disp: , Rfl:  .  fluocinonide ointment (LIDEX) 4.43 %, APPLY 1 APPLICATION TOPICALLY 2 (TWO) TIMES DAILY., Disp: 30 g, Rfl: 1 .  furosemide (LASIX) 20 MG tablet, Take by mouth., Disp: , Rfl:  .  ketoconazole  (NIZORAL) 2 % cream, Apply 1 application topically daily as needed for irritation., Disp: 15 g, Rfl: 0 .  lamoTRIgine (LAMICTAL) 100 MG tablet, Take 100 mg by mouth 2 (two) times daily., Disp: , Rfl:  .  Lancets (ONETOUCH ULTRASOFT) lancets, Test fasting each morning and 2 hours before supper. Retest if having hypoglycemic symptoms., Disp: 100 each, Rfl: 12 .  levocetirizine (XYZAL) 5 MG tablet, every evening., Disp: , Rfl: 1 .  loperamide (IMODIUM A-D) 2 MG tablet, Take 1 tablet (2 mg total) by mouth 4 (four) times daily as needed for diarrhea or loose stools., Disp: 30 tablet, Rfl: 0 .  meloxicam (MOBIC) 15 MG tablet, Take 1 tablet (15 mg total) by mouth daily., Disp: 30 tablet, Rfl: 1 .  metFORMIN (GLUCOPHAGE) 500 MG tablet, TAKE 2 TABLETS BY MOUTH IN THE MORNING ,1 TABLET AT LUNCH, & TAKE 2 TABLETS IN THE EVENING, Disp: 450 tablet, Rfl: 1 .  metoprolol tartrate (LOPRESSOR) 50 MG tablet, Take 1.5 tablets (75 mg total) by mouth 2 (two) times daily., Disp: 90 tablet, Rfl: 6 .  ONETOUCH VERIO test strip, TEST FASTING GLUCOSE DAILY AS DIRECTED, Disp: 100 each, Rfl: 3 .  TEGRETOL-XR 400 MG 12 hr tablet, TAKE 1 TABLET BY MOUTH TWICE DAILY, Disp: 60 tablet, Rfl: 4 .  VIIBRYD 40 MG TABS, TAKE 1 TABLET BY MOUTH EVERY DAY, Disp: 30 tablet, Rfl: 3 .  amitriptyline (ELAVIL) 25 MG tablet, Take 1 tablet (25 mg total) by mouth at bedtime. (Patient not taking: Reported on 01/17/2018), Disp: 90 tablet, Rfl: 0 .  amLODipine (NORVASC) 5 MG tablet, Take 1 tablet (5 mg total) by mouth daily., Disp: 30 tablet, Rfl: 3 .  baclofen (LIORESAL) 10 MG tablet, Take 10 mg by mouth 3 (three) times daily as needed., Disp: , Rfl:  .  chlorproMAZINE (THORAZINE) 25 MG tablet, Take 25 mg by mouth 3 (three) times daily as needed. , Disp: , Rfl:  .  CYCLOBENZAPRINE HCL PO, Take 1 tablet by mouth daily as needed., Disp: , Rfl:  .  fluticasone (FLONASE) 50 MCG/ACT nasal spray, PLACE 2 SPRAYS INTO BOTH NOSTRILS DAILY. (Patient not taking:  Reported on 01/17/2018), Disp: 48 g, Rfl: 3  Review of Systems  Social History   Tobacco Use  . Smoking status: Former Smoker    Types: Cigarettes    Last attempt to quit: 01/03/1983    Years since quitting: 35.0  . Smokeless tobacco: Never Used  . Tobacco comment: quit in 1984  Substance Use Topics  . Alcohol use: No    Alcohol/week: 0.0 standard drinks   Objective:   BP 140/80 (BP Location: Right Arm, Patient Position:  Sitting, Cuff Size: Normal)   Pulse 63   Temp 97.8 F (36.6 C) (Oral)   Resp 16   Wt 216 lb (98 kg)   LMP  (LMP Unknown)   BMI 38.26 kg/m  Vitals:   01/17/18 0956  BP: 140/80  Pulse: 63  Resp: 16  Temp: 97.8 F (36.6 C)  TempSrc: Oral  Weight: 216 lb (98 kg)   Physical Exam  Constitutional: She is oriented to person, place, and time. She appears well-developed and well-nourished. No distress.  HENT:  Head: Normocephalic and atraumatic.  Right Ear: Hearing normal.  Left Ear: Hearing normal.  Nose: Nose normal.  Eyes: Conjunctivae and lids are normal. Right eye exhibits no discharge. Left eye exhibits no discharge. No scleral icterus.  Cardiovascular: Normal rate and regular rhythm.  Pulmonary/Chest: Effort normal and breath sounds normal. No respiratory distress.  Abdominal: Soft. Bowel sounds are normal.  Musculoskeletal:  Bilateral Achille's tendinitis pain. No redness or significant lump/deformity. Good pulses.  Neurological: She is alert and oriented to person, place, and time.  Skin: Skin is intact. Rash noted. No lesion noted.  Psychiatric: She has a normal mood and affect. Her speech is normal and behavior is normal. Thought content normal.      Assessment & Plan:      1. Dermatitis Mild rash on forehead/face since using the CPAP. No relief from Head & Shoulders Shampoo. May use Nizoral shampoo and recheck prn.  2. Osteoarthritis of multiple joints, unspecified osteoarthritis type Aches and pains in lower back with history of surgery in  lower back with surgical screws for L4-5 spondylolisthesis in 2017 by Dr. Arnoldo Morale (neurosurgeon).  Also, has [place] arthritis in the left hand. May use the Meloxicam for inflammation and pain. Proceed with evaluation by Dr. Amedeo Plenty (hand surgeon).  3. Need for influenza vaccination - Flu Vaccine QUAD 36+ mos IM  4. Need for pneumococcal vaccination - Pneumococcal polysaccharide vaccine 23-valent greater than or equal to 2yo subcutaneous/IM       Vernie Murders, PA  [facility]

## 2018-01-18 ENCOUNTER — Other Ambulatory Visit: Payer: Self-pay | Admitting: Cardiovascular Disease

## 2018-01-24 DIAGNOSIS — M1812 Unilateral primary osteoarthritis of first carpometacarpal joint, left hand: Secondary | ICD-10-CM | POA: Diagnosis not present

## 2018-01-24 DIAGNOSIS — G8918 Other acute postprocedural pain: Secondary | ICD-10-CM | POA: Diagnosis not present

## 2018-01-24 HISTORY — PX: DG THUMB LEFT HAND: HXRAD1658

## 2018-01-31 ENCOUNTER — Other Ambulatory Visit: Payer: Self-pay | Admitting: Family Medicine

## 2018-01-31 DIAGNOSIS — L219 Seborrheic dermatitis, unspecified: Secondary | ICD-10-CM

## 2018-02-04 ENCOUNTER — Other Ambulatory Visit: Payer: Self-pay | Admitting: Family Medicine

## 2018-02-08 DIAGNOSIS — M1812 Unilateral primary osteoarthritis of first carpometacarpal joint, left hand: Secondary | ICD-10-CM | POA: Diagnosis not present

## 2018-02-08 DIAGNOSIS — Z5189 Encounter for other specified aftercare: Secondary | ICD-10-CM | POA: Diagnosis not present

## 2018-02-08 DIAGNOSIS — M79645 Pain in left finger(s): Secondary | ICD-10-CM | POA: Diagnosis not present

## 2018-02-08 DIAGNOSIS — G4733 Obstructive sleep apnea (adult) (pediatric): Secondary | ICD-10-CM | POA: Diagnosis not present

## 2018-02-09 ENCOUNTER — Other Ambulatory Visit: Payer: Self-pay | Admitting: Physician Assistant

## 2018-02-20 ENCOUNTER — Other Ambulatory Visit: Payer: Self-pay | Admitting: Family Medicine

## 2018-02-20 DIAGNOSIS — L710 Perioral dermatitis: Secondary | ICD-10-CM | POA: Diagnosis not present

## 2018-02-20 DIAGNOSIS — F331 Major depressive disorder, recurrent, moderate: Secondary | ICD-10-CM

## 2018-02-20 DIAGNOSIS — L249 Irritant contact dermatitis, unspecified cause: Secondary | ICD-10-CM | POA: Diagnosis not present

## 2018-02-21 ENCOUNTER — Ambulatory Visit: Payer: Medicare Other | Admitting: Gastroenterology

## 2018-02-21 ENCOUNTER — Other Ambulatory Visit: Payer: Self-pay

## 2018-02-21 NOTE — Patient Outreach (Signed)
[facility]) Care Management  02/21/2018  Abigail Figueroa 1960/09/26 [id number]   Medication Adherence call to Abigail Figueroa spoke with patient she is still taking Metformin 500 mg but doctor told her to take on scale of 3-5 tablets a day. Abigail Figueroa is showing past due under [place].   [facility] Management Direct Dial [phone]  Fax [phone] [email or web address]

## 2018-02-22 DIAGNOSIS — G518 Other disorders of facial nerve: Secondary | ICD-10-CM | POA: Diagnosis not present

## 2018-02-22 DIAGNOSIS — M791 Myalgia, unspecified site: Secondary | ICD-10-CM | POA: Diagnosis not present

## 2018-02-22 DIAGNOSIS — M542 Cervicalgia: Secondary | ICD-10-CM | POA: Diagnosis not present

## 2018-02-22 DIAGNOSIS — M1812 Unilateral primary osteoarthritis of first carpometacarpal joint, left hand: Secondary | ICD-10-CM | POA: Diagnosis not present

## 2018-02-22 DIAGNOSIS — G43719 Chronic migraine without aura, intractable, without status migrainosus: Secondary | ICD-10-CM | POA: Diagnosis not present

## 2018-02-28 ENCOUNTER — Other Ambulatory Visit: Payer: Self-pay | Admitting: Family Medicine

## 2018-03-02 DIAGNOSIS — G43719 Chronic migraine without aura, intractable, without status migrainosus: Secondary | ICD-10-CM | POA: Diagnosis not present

## 2018-03-06 DIAGNOSIS — L700 Acne vulgaris: Secondary | ICD-10-CM | POA: Diagnosis not present

## 2018-03-06 DIAGNOSIS — L249 Irritant contact dermatitis, unspecified cause: Secondary | ICD-10-CM | POA: Diagnosis not present

## 2018-03-08 DIAGNOSIS — M79645 Pain in left finger(s): Secondary | ICD-10-CM | POA: Diagnosis not present

## 2018-03-08 DIAGNOSIS — M1812 Unilateral primary osteoarthritis of first carpometacarpal joint, left hand: Secondary | ICD-10-CM | POA: Diagnosis not present

## 2018-03-11 DIAGNOSIS — G4733 Obstructive sleep apnea (adult) (pediatric): Secondary | ICD-10-CM | POA: Diagnosis not present

## 2018-03-13 DIAGNOSIS — M79645 Pain in left finger(s): Secondary | ICD-10-CM | POA: Diagnosis not present

## 2018-03-20 ENCOUNTER — Encounter: Payer: Self-pay | Admitting: Cardiovascular Disease

## 2018-03-20 ENCOUNTER — Ambulatory Visit (INDEPENDENT_AMBULATORY_CARE_PROVIDER_SITE_OTHER): Payer: Medicare Other | Admitting: Cardiovascular Disease

## 2018-03-20 VITALS — BP 156/86 | HR 61 | Resp 16 | Ht 63.0 in | Wt 213.8 lb

## 2018-03-20 DIAGNOSIS — E1169 Type 2 diabetes mellitus with other specified complication: Secondary | ICD-10-CM

## 2018-03-20 DIAGNOSIS — I10 Essential (primary) hypertension: Secondary | ICD-10-CM | POA: Diagnosis not present

## 2018-03-20 DIAGNOSIS — E782 Mixed hyperlipidemia: Secondary | ICD-10-CM | POA: Diagnosis not present

## 2018-03-20 DIAGNOSIS — M79645 Pain in left finger(s): Secondary | ICD-10-CM | POA: Diagnosis not present

## 2018-03-20 DIAGNOSIS — E669 Obesity, unspecified: Secondary | ICD-10-CM

## 2018-03-20 DIAGNOSIS — G4733 Obstructive sleep apnea (adult) (pediatric): Secondary | ICD-10-CM | POA: Diagnosis not present

## 2018-03-20 NOTE — Progress Notes (Signed)
Patient ID: CECILY LAWHORNE, female   DOB: 29-Jun-1960, 57 y.o.   MRN: [id number]     PCP: Fannie Knee, PA  HPI: LILLIEN PETRONIO is a 57 y.o. female who presents for a 22 month follow-up evaluation.  Ms. Beth has a history of hypertension, palpitations, peripheral edema, hyperlipidemia, type 2 diabetes mellitus, depression, as well as migraines. In 2007, she underwent a sleep study which was interpreted by Dr. Chancy Milroy. I do not have the diagnostic polysomnogram report but I do have the CPAP titration trial. She ultimately was titrated up to an 8 cm water pressure. She states she initially used CPAP therapy for several years but had not used in years.   When I saw her in October 2014  her sleep had been very poor for several years and she had with frequent awakenings and loud snoring.  At that time, Her Epworth sleepiness scale revealed severe excessive daytime sleepiness.  Epworth Sleepiness Scale: Situation   Chance of Dozing/Sleeping (0 = never , 1 = slight chance , 2 = moderate chance , 3 = high chance )   sitting and reading 3   watching TV 3   sitting inactive in a public place 3   being a passenger in a motor vehicle for an hour or more 3   lying down in the afternoon 3   sitting and talking to someone 3   sitting quietly after lunch (no alcohol) 3   while stopped for a few minutes in traffic as the driver 0   Total Score  21   She received a S9 CPAP unit but ultimately again stopped using treatment.    In April 2017, she underwent preoperative clearance nuclear study prior to undergoing lumbar back surgery.  This study was interpreted as intermediate risk with a possible defect in the mid, distal anterior wall and apex and there was distal inferior hypokinesis.  She ultimately was referred for cardiac catheterization which I performed on 08/06/2015 and revealed normal coronary arteries.  She was admitted to [facility] in September 2017 with Bell's  palsy.  She was treated with steroids.  I last saw her in January 2018.  At that time I discussed the importance of resuming CPAP therapy and since it is been over 11 years since her initial evaluation I recommend repeat assessment.  Sleep study was done on November 08, 2017 which again confirmed severe sleep apnea with an AHI of 30.5/h and RDI of 42.5/h.  Events were more severe during rem sleep with an AHI of 76.1/h.  She had significant oxygen desaturation to a nadir of 71%.  CPAP was initiated and was titrated up to 11 cm.  She needs a new ResMed AirSense 10 AutoSet CPAP unit with set up date on December 09, 2017.  She had worn the CPAP initially but but then developed a dermatitis/rash on her face for which she underwent dermatologic evaluation by Dr. Baxter Kail was felt to have contact dermatitis.  The patient felt that the mask was potentially contributing to this.  As result, recently she is only rarely been using CPAP.  A new download was obtained in the office today from February 14, 2018 through March 15, 2008.  This confirms poor compliance with usage at only 10% over the last 30 days with 3 hours and 58 minutes of average use.  AHI however was excellent at 0.8 on 11 cm water pressure.  He presents for reevaluation  Past Medical History:  Diagnosis Date  . Abnormal stress test    a. 10/2009: Normal myocardial perfusion imaging; b. 08/2015 MV: medium defect of mod severity in mid ant apical region w/ mild HK of the distal inf wall;  c. 08/2015 Cath: nl Cors, EF 60%.  . Anemia   . Anxiety   . Arthritis   . Bell's palsy   . Depression   . Edema   . Essential hypertension   . Frequent urination   . Frequent urination at night   . Headache    migraines, gets botox injections  . Heart palpitations June 2013   Event monitor showing sinus tachycardia, PACs with couplets and triplets.  Marland Kitchen Hx of echocardiogram    a. 10/2009 Echo: showed normal left ventricular function, mild left ventricular  hypertrophy, and no significant valve abnormalities.  . Hypercholesterolemia   . Migraine   . Morbid obesity ([place])   . Neuromuscular disorder ([place])   . OSA (obstructive sleep apnea)    has been on continuous positive airway pressure  . Seizure disorder ([place])   . Seizures ([place])   . Type II diabetes mellitus ([place])     Past Surgical History:  Procedure Laterality Date  . ABDOMINAL HYSTERECTOMY  1990   without BSO  . BACK SURGERY  1900s 2001   x2 with "cage put in"  . BILATERAL SALPINGOOPHORECTOMY  1990  . CARDIAC CATHETERIZATION N/A 08/06/2015   Procedure: Left Heart Cath and Coronary Angiography;  Surgeon: Troy Sine, MD;  Location: [facility];  Service: Cardiovascular;  Laterality: N/A;  . CARPAL TUNNEL RELEASE Bilateral   . CHOLECYSTECTOMY  1980  . COLONOSCOPY WITH PROPOFOL N/A 08/30/2017   Procedure: COLONOSCOPY WITH PROPOFOL;  Surgeon: Lin Landsman, MD;  Location: [facility] ENDOSCOPY;  Service: Endoscopy;  Laterality: N/A;  . DG THUMB LEFT HAND  01/24/2018   repair joint in left thumb  . DORSAL COMPARTMENT RELEASE Left 10/25/2014   Procedure: LEFT FIRST  DORSAL COMPARTMENT RELEASE AND RADIAL TENOSYNOVECTOMY ;  Surgeon: Roseanne Kaufman, MD;  Location: [facility];  Service: Orthopedics;  Laterality: Left;  . HAND SURGERY    . KNEE SURGERY Right 2012   meniscus tear  . KNEE SURGERY  2012  . LAMINECTOMY  1995  . TUBAL LIGATION  1982  . WRIST SURGERY Right     Allergies  Allergen Reactions  . Morphine Rash and Swelling  . Morphine And Related Hives    Nausea and vomiting  Nausea and vomiting  . Oxycodone Hives, Itching, Rash and Swelling  . Oxycodone Hcl Swelling, Hives and Itching  . Morphine Sulfate Itching and Nausea And Vomiting    REACTION: swelling    Current Outpatient Medications  Medication Sig Dispense Refill  . amLODipine (NORVASC) 5 MG tablet TAKE 1 TABLET BY MOUTH EVERY DAY 90 tablet 1  . atorvastatin (LIPITOR) 40 MG tablet TAKE  1 TABLET BY MOUTH EVERY DAY 90 tablet 2  . baclofen (LIORESAL) 10 MG tablet Take 10 mg by mouth 3 (three) times daily as needed.    Marland Kitchen BOTOX 100 UNITS SOLR injection Inject into the muscle every 3 (three) months.     . chlorproMAZINE (THORAZINE) 25 MG tablet Take 25 mg by mouth 3 (three) times daily as needed.     . CYCLOBENZAPRINE HCL PO Take 1 tablet by mouth daily as needed.    Eduard Roux (AIMOVIG) 70 MG/ML SOAJ INJECT 70 MG BY SUBCUTANEOUS ROUTE ONCE MONTHLY IN HE ABDOMEN, THIGH, OR  OUTER AREA OF UPPER ARM    . fluocinonide ointment (LIDEX) 1.54 % APPLY 1 APPLICATION TOPICALLY 2 (TWO) TIMES DAILY. 30 g 1  . fluticasone (FLONASE) 50 MCG/ACT nasal spray PLACE 2 SPRAYS INTO BOTH NOSTRILS DAILY. (Patient taking differently: Place 2 sprays into both nostrils daily as needed. PLACE 2 SPRAYS INTO BOTH NOSTRILS DAILY.) 48 g 3  . furosemide (LASIX) 20 MG tablet Take by mouth.    . lamoTRIgine (LAMICTAL) 100 MG tablet Take 100 mg by mouth 2 (two) times daily.    . Lancets (ONETOUCH ULTRASOFT) lancets Test fasting each morning and 2 hours before supper. Retest if having hypoglycemic symptoms. 100 each 12  . levocetirizine (XYZAL) 5 MG tablet TAKE 1 TABLET BY MOUTH EVERY DAY IN THE EVENING 90 tablet 1  . loperamide (IMODIUM A-D) 2 MG tablet Take 1 tablet (2 mg total) by mouth 4 (four) times daily as needed for diarrhea or loose stools. 30 tablet 0  . metFORMIN (GLUCOPHAGE) 500 MG tablet TAKE 2 TABLETS BY MOUTH IN THE MORNING ,1 TABLET AT LUNCH, & TAKE 2 TABLETS IN THE EVENING 450 tablet 1  . ONETOUCH VERIO test strip TEST FASTING GLUCOSE DAILY AS DIRECTED 100 each 3  . oxyCODONE (OXY IR/ROXICODONE) 5 MG immediate release tablet TAKE 1 TABLET BY MOUTH EVERY 4 HOURS  0  . promethazine (PHENERGAN) 25 MG tablet promethazine 25 mg tablet  TAKE 1 TABLET BY MOUTH AS NEEDED FOR NAUSEA    . TEGRETOL-XR 400 MG 12 hr tablet TAKE 1 TABLET BY MOUTH TWICE DAILY 180 tablet 1  . VIIBRYD 40 MG TABS TAKE 1 TABLET BY  MOUTH EVERY DAY 30 tablet 3  . amitriptyline (ELAVIL) 25 MG tablet TAKE 1 TABLET BY MOUTH EVERYDAY AT BEDTIME 90 tablet 0  . metoprolol tartrate (LOPRESSOR) 50 MG tablet Take 1.5 tablets (75 mg total) by mouth 2 (two) times daily. 90 tablet 6   No current facility-administered medications for this visit.     Social history is notable that she is married has 2 children and 3 grandchildren. She does not routinely exercise. She quit smoking greater than 25 years ago. No alcohol use.  ROS General: Negative; No fevers, chills, or night sweats;  HEENT: Negative; No changes in vision or hearing, sinus congestion, difficulty swallowing Pulmonary: Negative; No cough, wheezing, shortness of breath, hemoptysis Cardiovascular: Positive for palpitations.  Positive for occasional ankle swelling. GI: Negative; No nausea, vomiting, diarrhea, or abdominal pain GU: Negative; No dysuria, hematuria, or difficulty voiding Musculoskeletal: Negative; no myalgias, joint pain, or weakness Hematologic/Oncology: Negative; no easy bruising, bleeding Endocrine: positive for diabetes Neuro: history of seizure disorder, stable.  Recently Skin: Negative; No rashes or skin lesions Psychiatric: Negative; No behavioral problems, depression Sleep: positive for obstructive sleep apnea ; In the past, her significant  daytime sleepiness significantly improved when she was using CPAP therapy; recently not using CPAP consistently due to recent facial rash  Other comprehensive 14 point system review is negative.   PE BP (!) 156/86   Pulse 61   Resp 16   Ht 5' 3"  (1.6 m)   Wt 213 lb 12.8 oz (97 kg)   LMP  (LMP Unknown)   SpO2 99%   BMI 37.87 kg/m    Repeat blood pressure by me was 135/84  Wt Readings from Last 3 Encounters:  03/20/18 213 lb 12.8 oz (97 kg)  01/17/18 216 lb (98 kg)  11/25/17 218 lb (98.9 kg)   General: Alert, oriented, no distress.  Skin: normal turgor, no rashes, warm and dry HEENT:  Normocephalic, atraumatic. Pupils equal round and reactive to light; sclera anicteric; extraocular muscles intact; Fundi no exudates or hemorrhages. Nose without nasal septal hypertrophy Mouth/Parynx benign; Mallinpatti scale 4 Neck: No JVD, no carotid bruits; normal carotid upstroke Lungs: clear to ausculatation and percussion; no wheezing or rales Chest wall: without tenderness to palpitation Heart: PMI not displaced, RRR, s1 s2 normal, 1/6 systolic murmur, no diastolic murmur, no rubs, gallops, thrills, or heaves Abdomen: soft, nontender; no hepatosplenomehaly, BS+; abdominal aorta nontender and not dilated by palpation. Back: no CVA tenderness Pulses 2+ Musculoskeletal: full range of motion, normal strength, no joint deformities Extremities: Trivial ankle edema no clubbing cyanosis or edema, Homan's sign negative  Neurologic: grossly nonfocal; Cranial nerves grossly wnl Psychologic: Normal mood and affect  ECG (independently read by me): NSR at 65; No significant STT changes  January 2018 ECG (independently read by me): Normal sinus rhythm at 64 bpm.  LVH by voltage criteria.  Nonspecific T changes.  October 2016 ECG (independently read by me): Sinus rhythm at 80 bpm.  Borderline LVH by voltage in aVL.  Nonspecific T changes.  November 2015 ECG (independently read by me): Normal sinus rhythm at 73 bpm.  No ectopy.  Prior ECG (independently read by me): sinus rhythm at 84 beats per minute.  Nonspecific T changes.  QTc interval 411 milliseconds.   LABS: BMP Latest Ref Rng & Units 09/20/2017 02/28/2017 01/23/2016  Glucose 65 - 99 mg/dL 151(H) 167(H) 275(H)  BUN 6 - 24 mg/dL 20 23 23(H)  Creatinine 0.57 - 1.00 mg/dL 1.01(H) 1.03 1.13(H)  BUN/Creat Ratio 9 - 23 20 NOT APPLICABLE -  Sodium 479 - 144 mmol/L 142 140 139  Potassium 3.5 - 5.2 mmol/L 4.6 4.4 4.5  Chloride 96 - 106 mmol/L 105 105 106  CO2 20 - 29 mmol/L 21 27 28   Calcium 8.7 - 10.2 mg/dL 10.3(H) 10.2 10.1   Hepatic  Function Latest Ref Rng & Units 02/28/2017 01/23/2016 08/22/2015  Total Protein 6.1 - 8.1 g/dL 7.3 7.8 -  Albumin 3.5 - 5.0 g/dL - 4.0 4.1  AST 10 - 35 U/L 15 42(H) -  ALT 6 - 29 U/L 16 28 -  Alk Phosphatase 38 - 126 U/L - 104 -  Total Bilirubin 0.2 - 1.2 mg/dL 0.3 0.2(L) -   CBC Latest Ref Rng & Units 02/28/2017 06/07/2016 01/23/2016  WBC 3.8 - 10.8 Thousand/uL 5.6 6.0 7.8  Hemoglobin 11.7 - 15.5 g/dL 11.6(L) 11.3 12.0  Hematocrit 35.0 - 45.0 % 34.7(L) 34.6 36.1  Platelets 140 - 400 Thousand/uL 250 205 229   Lab Results  Component Value Date   MCV 82.4 02/28/2017   MCV 83 06/07/2016   MCV 83.9 01/23/2016   Lab Results  Component Value Date   TSH 1.45 02/28/2017   Lab Results  Component Value Date   HGBA1C 7.6 (H) 02/28/2017   Lipid Panel     Component Value Date/Time   CHOL 294 (H) 02/28/2017 1009   CHOL 289 (H) 01/10/2015 1009   CHOL 277 (H) 08/16/2014 0843   TRIG 168 (H) 02/28/2017 1009   TRIG 167 (H) 08/16/2014 0843   HDL 70 02/28/2017 1009   HDL 91 01/10/2015 1009   HDL 77 08/16/2014 0843   CHOLHDL 4.2 02/28/2017 1009   VLDL 33 08/16/2014 0843   LDLCALC 190 (H) 02/28/2017 1009   LDLCALC 167 (H) 08/16/2014 0843    RADIOLOGY: No results found.  IMPRESSION:  1. OSA (obstructive sleep apnea)   2. Essential hypertension   3. Diabetes mellitus type 2 in obese ([place])   4. Mixed hyperlipidemia    ASSESSMENT AND PLAN: Ms. Biggs is a 57 year old African-American female who has a history of hypertension, type 2 diabetes mellitus, peripheral edema, hyperlipidemia, migraine headaches, as well as obstructive sleep apnea.  Her initial sleep study was done in 2007.   In the past she had significant daytime sleepiness and significantly benefited from CPAP therapy.  When I last saw her 01/2017 she had not used CPAP for years.  I again reviewed her diagnostic polysomnogram and split-night study from July 2019 which again confirmed severe sleep apnea with very significant oxygen  desaturation to a nadir of 71%.  Vapne was very severe with rem sleep.  I discussed the importance with reference to her cardiovascular health with resumption of CPAP therapy.  I reviewed the dermatologic records.  Presently, she has 1 month to obtain compliance.  I discussed with her the importance that she not have to turn in a machine in light of the severity of her sleep apnea and that compliance must be met.  I will change her masks to a DreamWear with chinstrap which should provide minimal contact with her face suggested perhaps she use a tissue or cloth tween her face in the strap to reduce potential contact dermatitis.  We will need to obtain a new download within 1 month for objective documentation that she is meeting compliance standards.  She is now on atorvastatin for hyperlipidemia.  In the past she had significantly elevated LDL level.  Has not had recent laboratory this will need to be followed up.  She is on amlodipine 5 mg, metoprolol 75 mg twice a day for hypertension.  She continues to be on metformin for diabetes.  I will see her in 6 months for reevaluation.  Time spent: 30 minutes  Troy Sine, MD, [facility]  03/22/2018 7:34 PM

## 2018-03-20 NOTE — Patient Instructions (Signed)
Medication Instructions:  Your physician recommends that you continue on your current medications as directed. Please refer to the Current Medication list given to you today.  If you need a refill on your cardiac medications before your next appointment, please call your pharmacy.   Follow-Up: At [facility], you and your health needs are our priority.  As part of our continuing mission to provide you with exceptional heart care, we have created designated Provider Care Teams.  These Care Teams include your primary Cardiologist (physician) and Advanced Practice Providers (APPs -  Physician Assistants and Nurse Practitioners) who all work together to provide you with the care you need, when you need it. . 6 months with Dr. Ella Jubilee call if March to schedule

## 2018-03-21 ENCOUNTER — Telehealth: Payer: Self-pay | Admitting: Cardiovascular Disease

## 2018-03-21 NOTE — Telephone Encounter (Signed)
New Message:     Please call, she said she received the wrong pipe to her C-Pap when she picked it up.

## 2018-03-21 NOTE — Telephone Encounter (Signed)
Returned a call to patient. She states that she has "it taken care of." thanked me for returning her call.

## 2018-03-22 ENCOUNTER — Encounter: Payer: Self-pay | Admitting: Cardiovascular Disease

## 2018-03-22 ENCOUNTER — Other Ambulatory Visit: Payer: Self-pay | Admitting: Gastroenterology

## 2018-03-24 ENCOUNTER — Telehealth: Payer: Self-pay | Admitting: Cardiovascular Disease

## 2018-03-24 NOTE — Telephone Encounter (Signed)
New Message          Pt c/o medication issue:  1. Name of Medication: Metoprolol  2. How are you currently taking this medication (dosage and times per day)? 2 x a day  3. Are you having a reaction (difficulty breathing--STAT)? No  4. What is your medication issue? Patient had to increase her Rx and now she is out. Too early to fill      Patient calling the office for samples of medication:   1.  What medication and dosage are you requesting samples for? Metoprolol  2.  Are you currently out of this medication? Yes as of 2 days ago

## 2018-03-24 NOTE — Telephone Encounter (Signed)
Returned call to pt she states that "back in may" her BP was running in the 167/104 HR 91 and 161/93 HR 68 she states that she just continued to take the 2 tabs BID then in September TK added amlodipine and her BP was back under control running 130/75 HR 71 she continued to take the Metoprolol 2 100mg  BID because she was not told to stop.  Now she is out of medication and needs a refill and would like to know is she should continue taking 100mg  BID. Please advise

## 2018-03-26 NOTE — Telephone Encounter (Signed)
Yes, continue to take metoprolol with amlodipine

## 2018-03-27 MED ORDER — METOPROLOL TARTRATE 100 MG PO TABS: 100.0000 mg | ORAL_TABLET | Freq: Two times a day (BID) | ORAL | 1 refills | Status: DC

## 2018-03-27 MED ORDER — AMLODIPINE BESYLATE 5 MG PO TABS: 5.0000 mg | ORAL_TABLET | Freq: Every day | ORAL | 1 refills | Status: DC

## 2018-03-27 NOTE — Telephone Encounter (Signed)
NEW RX SENT TRIED TO CALL PT BUT PHONE JUST KEEPS RINGING-NO VM

## 2018-03-28 DIAGNOSIS — M79645 Pain in left finger(s): Secondary | ICD-10-CM | POA: Diagnosis not present

## 2018-04-06 DIAGNOSIS — M791 Myalgia, unspecified site: Secondary | ICD-10-CM | POA: Diagnosis not present

## 2018-04-06 DIAGNOSIS — M1812 Unilateral primary osteoarthritis of first carpometacarpal joint, left hand: Secondary | ICD-10-CM | POA: Diagnosis not present

## 2018-04-06 DIAGNOSIS — M542 Cervicalgia: Secondary | ICD-10-CM | POA: Diagnosis not present

## 2018-04-06 DIAGNOSIS — G43719 Chronic migraine without aura, intractable, without status migrainosus: Secondary | ICD-10-CM | POA: Diagnosis not present

## 2018-04-10 DIAGNOSIS — G4733 Obstructive sleep apnea (adult) (pediatric): Secondary | ICD-10-CM | POA: Diagnosis not present

## 2018-04-12 DIAGNOSIS — M79645 Pain in left finger(s): Secondary | ICD-10-CM | POA: Diagnosis not present

## 2018-04-24 DIAGNOSIS — M79645 Pain in left finger(s): Secondary | ICD-10-CM | POA: Diagnosis not present

## 2018-05-10 DIAGNOSIS — M1812 Unilateral primary osteoarthritis of first carpometacarpal joint, left hand: Secondary | ICD-10-CM | POA: Diagnosis not present

## 2018-05-11 DIAGNOSIS — G4733 Obstructive sleep apnea (adult) (pediatric): Secondary | ICD-10-CM | POA: Diagnosis not present

## 2018-05-15 ENCOUNTER — Other Ambulatory Visit: Payer: Self-pay | Admitting: Family Medicine

## 2018-05-15 DIAGNOSIS — L700 Acne vulgaris: Secondary | ICD-10-CM | POA: Diagnosis not present

## 2018-05-15 DIAGNOSIS — L309 Dermatitis, unspecified: Secondary | ICD-10-CM

## 2018-05-15 DIAGNOSIS — L249 Irritant contact dermatitis, unspecified cause: Secondary | ICD-10-CM | POA: Diagnosis not present

## 2018-06-07 DIAGNOSIS — M79645 Pain in left finger(s): Secondary | ICD-10-CM | POA: Diagnosis not present

## 2018-06-07 DIAGNOSIS — M1812 Unilateral primary osteoarthritis of first carpometacarpal joint, left hand: Secondary | ICD-10-CM | POA: Diagnosis not present

## 2018-06-11 DIAGNOSIS — G4733 Obstructive sleep apnea (adult) (pediatric): Secondary | ICD-10-CM | POA: Diagnosis not present

## 2018-06-14 DIAGNOSIS — G518 Other disorders of facial nerve: Secondary | ICD-10-CM | POA: Diagnosis not present

## 2018-06-14 DIAGNOSIS — M542 Cervicalgia: Secondary | ICD-10-CM | POA: Diagnosis not present

## 2018-06-14 DIAGNOSIS — M791 Myalgia, unspecified site: Secondary | ICD-10-CM | POA: Diagnosis not present

## 2018-06-14 DIAGNOSIS — G43719 Chronic migraine without aura, intractable, without status migrainosus: Secondary | ICD-10-CM | POA: Diagnosis not present

## 2018-06-17 ENCOUNTER — Other Ambulatory Visit: Payer: Self-pay | Admitting: Gastroenterology

## 2018-06-21 ENCOUNTER — Other Ambulatory Visit: Payer: Self-pay | Admitting: Family Medicine

## 2018-06-21 DIAGNOSIS — F331 Major depressive disorder, recurrent, moderate: Secondary | ICD-10-CM

## 2018-07-10 DIAGNOSIS — G4733 Obstructive sleep apnea (adult) (pediatric): Secondary | ICD-10-CM | POA: Diagnosis not present

## 2018-07-16 ENCOUNTER — Other Ambulatory Visit: Payer: Self-pay | Admitting: Gastroenterology

## 2018-07-18 ENCOUNTER — Ambulatory Visit: Payer: Self-pay

## 2018-07-28 ENCOUNTER — Other Ambulatory Visit: Payer: Self-pay | Admitting: Family Medicine

## 2018-07-31 ENCOUNTER — Ambulatory Visit: Payer: Self-pay

## 2018-08-09 ENCOUNTER — Other Ambulatory Visit: Payer: Self-pay | Admitting: Family Medicine

## 2018-08-09 ENCOUNTER — Other Ambulatory Visit: Payer: Self-pay | Admitting: Cardiovascular Disease

## 2018-08-10 DIAGNOSIS — G4733 Obstructive sleep apnea (adult) (pediatric): Secondary | ICD-10-CM | POA: Diagnosis not present

## 2018-08-18 ENCOUNTER — Other Ambulatory Visit: Payer: Self-pay | Admitting: Family Medicine

## 2018-08-18 ENCOUNTER — Telehealth: Payer: Self-pay | Admitting: Cardiovascular Disease

## 2018-08-18 MED ORDER — FUROSEMIDE 20 MG PO TABS: ORAL_TABLET | ORAL | 0 refills | Status: DC

## 2018-08-18 NOTE — Telephone Encounter (Signed)
Pt calling requesting a refill on furosemide sent to [facility] in [place] on [address]. Please address

## 2018-08-18 NOTE — Telephone Encounter (Signed)
Remind patient we need to recheck BP and blood test for electrolytes with using the Furoxemide (diuretic).

## 2018-08-19 ENCOUNTER — Other Ambulatory Visit: Payer: Self-pay | Admitting: Cardiovascular Disease

## 2018-08-21 ENCOUNTER — Other Ambulatory Visit: Payer: Self-pay | Admitting: Cardiovascular Disease

## 2018-08-21 NOTE — Telephone Encounter (Signed)
Furosemide 20 mg refilled.

## 2018-08-21 NOTE — Telephone Encounter (Signed)
Patient advised.

## 2018-08-31 ENCOUNTER — Other Ambulatory Visit: Payer: Self-pay | Admitting: Cardiovascular Disease

## 2018-09-09 DIAGNOSIS — G4733 Obstructive sleep apnea (adult) (pediatric): Secondary | ICD-10-CM | POA: Diagnosis not present

## 2018-09-13 DIAGNOSIS — G43719 Chronic migraine without aura, intractable, without status migrainosus: Secondary | ICD-10-CM | POA: Diagnosis not present

## 2018-09-13 DIAGNOSIS — M542 Cervicalgia: Secondary | ICD-10-CM | POA: Diagnosis not present

## 2018-09-13 DIAGNOSIS — M791 Myalgia, unspecified site: Secondary | ICD-10-CM | POA: Diagnosis not present

## 2018-09-13 DIAGNOSIS — G518 Other disorders of facial nerve: Secondary | ICD-10-CM | POA: Diagnosis not present

## 2018-09-20 ENCOUNTER — Telehealth: Payer: Self-pay | Admitting: Cardiovascular Disease

## 2018-09-20 NOTE — Telephone Encounter (Signed)
LMTCB to change appointment tomorrow with Dr. Claiborne Billings to telephone or video.

## 2018-09-20 NOTE — Telephone Encounter (Signed)
Mychart sent via text, smartphone (video visit), consent (verbal) pre reg complete 09/20/18 AF

## 2018-09-21 ENCOUNTER — Telehealth (INDEPENDENT_AMBULATORY_CARE_PROVIDER_SITE_OTHER): Payer: Medicare Other | Admitting: Cardiovascular Disease

## 2018-09-21 ENCOUNTER — Encounter: Payer: Self-pay | Admitting: Cardiovascular Disease

## 2018-09-21 VITALS — Temp 96.4°F | Ht 63.0 in | Wt 212.4 lb

## 2018-09-21 DIAGNOSIS — Z8639 Personal history of other endocrine, nutritional and metabolic disease: Secondary | ICD-10-CM

## 2018-09-21 DIAGNOSIS — Z6837 Body mass index (BMI) 37.0-37.9, adult: Secondary | ICD-10-CM

## 2018-09-21 DIAGNOSIS — E669 Obesity, unspecified: Secondary | ICD-10-CM

## 2018-09-21 DIAGNOSIS — Z79899 Other long term (current) drug therapy: Secondary | ICD-10-CM

## 2018-09-21 DIAGNOSIS — G4733 Obstructive sleep apnea (adult) (pediatric): Secondary | ICD-10-CM | POA: Diagnosis not present

## 2018-09-21 DIAGNOSIS — I10 Essential (primary) hypertension: Secondary | ICD-10-CM | POA: Diagnosis not present

## 2018-09-21 DIAGNOSIS — E1169 Type 2 diabetes mellitus with other specified complication: Secondary | ICD-10-CM

## 2018-09-21 DIAGNOSIS — E782 Mixed hyperlipidemia: Secondary | ICD-10-CM | POA: Diagnosis not present

## 2018-09-21 MED ORDER — AMLODIPINE BESYLATE 5 MG PO TABS: 7.2500 mg | ORAL_TABLET | Freq: Every day | ORAL | 1 refills | Status: DC

## 2018-09-21 MED ORDER — METOPROLOL TARTRATE 100 MG PO TABS: 100.0000 mg | ORAL_TABLET | Freq: Two times a day (BID) | ORAL | 1 refills | Status: DC

## 2018-09-21 MED ORDER — FUROSEMIDE 20 MG PO TABS: ORAL_TABLET | ORAL | 1 refills | Status: DC

## 2018-09-21 NOTE — Patient Instructions (Signed)
Medication Instructions:  INCREASE AMLODIPINE 7.5MG  DAILY (1 AND 1/2 TABLETS) METOPROLOL 100MG  TWICE DAILY TAKE LASIX 20 MG DAILY  If you need a refill on your cardiac medications before your next appointment, please call your pharmacy.  Labwork: CMET,CBC AND FASTING LIPID PANEL IN ONE WEEK  THIS WILL BE 09-28-2018  HERE IN OUR OFFICE AT [facility]   You will need to fast. DO NOT EAT OR DRINK PAST MIDNIGHT.       Take the provided lab slips with you to the lab for your blood draw.   When you have your labs (blood work) drawn today and your tests are completely normal, you will receive your results only by MyChart Message (if you have MyChart) -OR-  A paper copy in the mail.  If you have any lab test that is abnormal or we need to change your treatment, we will call you to review these results.  Follow-Up: You will need a follow up appointment in 3 months.  You may see Shelva Majestic, MD or one of the following Advanced Practice Providers on your designated Care Team:  Kerin Ransom, PA-C  Roby Lofts, PA-C  Sande Rives, [place]    At [facility], you and your health needs are our priority.  As part of our continuing mission to provide you with exceptional heart care, we have created designated Provider Care Teams.  These Care Teams include your primary Cardiologist (physician) and Advanced Practice Providers (APPs -  Physician Assistants and Nurse Practitioners) who all work together to provide you with the care you need, when you need it.  Thank you for choosing [facility] at [facility]!!

## 2018-09-21 NOTE — Progress Notes (Signed)
Virtual Visit via Video Note   This visit type was conducted due to national recommendations for restrictions regarding the COVID-19 Pandemic (e.g. social distancing) in an effort to limit this patient's exposure and mitigate transmission in our community.  Due to her co-morbid illnesses, this patient is at least at moderate risk for complications without adequate follow up.  This format is felt to be most appropriate for this patient at this time.  All issues noted in this document were discussed and addressed.  A limited physical exam was performed with this format.  Please refer to the patient's chart for her consent to telehealth for [facility].   Date:  09/21/2018   ID:  Abigail Figueroa, DOB May 30, 1960, MRN [id number]  Patient Location: Home Provider Location: Home  PCP:  Margo Common, PA  Cardiologist:  Shelva Majestic, MD Electrophysiologist:  None   Evaluation Performed:  Follow-Up Visit   Chief Complaint:  6 month F/U  History of Present Illness:    Abigail Figueroa is a 58 y.o. female who has a history of hypertension, palpitations, peripheral edema, hyperlipidemia, type 2 diabetes mellitus, depression, as well as migraines. In 2007, she underwent a sleep study which was interpreted by Dr. Chancy Milroy. I do not have the diagnostic polysomnogram report but I do have the CPAP titration trial. She ultimately was titrated up to an 8 cm water pressure. She states she initially used CPAP therapy for several years but had not used in years.   When I saw her in October 2014  her sleep had been very poor for several years and she had with frequent awakenings and loud snoring.  At that time, Her Epworth sleepiness scale revealed severe excessive daytime sleepiness.  Epworth Sleepiness Scale: Situation   Chance of Dozing/Sleeping (0 = never , 1 = slight chance , 2 = moderate chance , 3 = high chance )   sitting and reading 3   watching TV 3   sitting inactive in a public place 3   being a passenger in a motor vehicle for an hour or more 3   lying down in the afternoon 3   sitting and talking to someone 3   sitting quietly after lunch (no alcohol) 3   while stopped for a few minutes in traffic as the driver 0   Total Score  21   She received a S9 CPAP unit but ultimately again stopped using treatment.    In April 2017, she underwent preoperative clearance nuclear study prior to undergoing lumbar back surgery.  This study was interpreted as intermediate risk with a possible defect in the mid, distal anterior wall and apex and there was distal inferior hypokinesis.  She ultimately was referred for cardiac catheterization which I performed on 08/06/2015 and revealed normal coronary arteries.  She was admitted to [facility] in September 2017 with Bell's palsy.  She was treated with steroids.  I last saw her in January 2018.  At that time I discussed the importance of resuming CPAP therapy and since it is been over 11 years since her initial evaluation I recommend repeat assessment.  Sleep study was done on November 08, 2017 which again confirmed severe sleep apnea with an AHI of 30.5/h and RDI of 42.5/h.  Events were more severe during REM sleep with an AHI of 76.1/h.  She had significant oxygen desaturation to a nadir of 71%.  CPAP was initiated and was titrated up to 11 cm.  She  received a new ResMed AirSense 10 AutoSet CPAP unit with set up date on December 09, 2017.  She had worn the CPAP initially but but then developed a dermatitis/rash on her face for which she underwent dermatologic evaluation by Dr. Baxter Kail was felt to have contact dermatitis.  The patient felt that the mask was potentially contributing to this.  As result, recently she is only rarely been using CPAP.  A new download was obtained in the office  from February 14, 2018 through March 15, 2008.  This confirmed poor compliance with usage at only 10% over the last 30 days with 3 hours and  58 minutes of average use.  AHI however was excellent at 0.8 on 11 cm water pressure.  When I last saw her in March 20, 2018 I stressed the importance of meeting compliance standards with CPAP use.  I discussed the negative cardiovascular consequences of untreated sleep apnea with reference to her health.  I recommend changing her mask to a DreamWear with chinstrap to provide minimal contact to her face to reduce potential contact dermatitis. She was on atorvastatin for hyperlipidemia and had not had recent laboratory.  She also was on amlodipine 5 mg, metoprolol 75 mg twice a day for hypertension and is diabetic on metformin.  She presents for reevaluation.   The patient does not have symptoms concerning for COVID-19 infection (fever, chills, cough, or new shortness of breath).    Past Medical History:  Diagnosis Date   Abnormal stress test    a. 10/2009: Normal myocardial perfusion imaging; b. 08/2015 MV: medium defect of mod severity in mid ant apical region w/ mild HK of the distal inf wall;  c. 08/2015 Cath: nl Cors, EF 60%.   Anemia    Anxiety    Arthritis    Bell's palsy    Depression    Edema    Essential hypertension    Frequent urination    Frequent urination at night    Headache    migraines, gets botox injections   Heart palpitations June 2013   Event monitor showing sinus tachycardia, PACs with couplets and triplets.   Hx of echocardiogram    a. 10/2009 Echo: showed normal left ventricular function, mild left ventricular hypertrophy, and no significant valve abnormalities.   Hypercholesterolemia    Migraine    Morbid obesity ([place])    Neuromuscular disorder (HCC)    OSA (obstructive sleep apnea)    has been on continuous positive airway pressure   Seizure disorder ([place])    Seizures ([place])    Type II diabetes mellitus ([place])    Past Surgical History:  Procedure Laterality Date   ABDOMINAL HYSTERECTOMY  1990   without BSO   BACK SURGERY  1900s  2001   x2 with "cage put in"   [place] N/A 08/06/2015   Procedure: Left Heart Cath and Coronary Angiography;  Surgeon: Troy Sine, MD;  Location: [place] [facility];  Service: Cardiovascular;  Laterality: N/A;   CARPAL TUNNEL RELEASE Bilateral    CHOLECYSTECTOMY  1980   COLONOSCOPY WITH PROPOFOL N/A 08/30/2017   Procedure: COLONOSCOPY WITH PROPOFOL;  Surgeon: Lin Landsman, MD;  Location: [facility];  Service: Endoscopy;  Laterality: N/A;   DG THUMB LEFT HAND  01/24/2018   repair joint in left thumb   DORSAL COMPARTMENT RELEASE Left 10/25/2014   Procedure: LEFT FIRST  DORSAL COMPARTMENT RELEASE AND RADIAL TENOSYNOVECTOMY ;  Surgeon: Roseanne Kaufman,  MD;  Location: [place];  Service: Orthopedics;  Laterality: Left;   HAND SURGERY     KNEE SURGERY Right 2012   meniscus tear   KNEE SURGERY  2012   LAMINECTOMY  1995   TUBAL LIGATION  1982   WRIST SURGERY Right      Current Meds  Medication Sig   atorvastatin (LIPITOR) 40 MG tablet TAKE 1 TABLET BY MOUTH EVERY DAY   baclofen (LIORESAL) 10 MG tablet Take 10 mg by mouth 3 (three) times daily as needed.   BOTOX 100 UNITS SOLR injection Inject into the muscle every 3 (three) months.    chlorproMAZINE (THORAZINE) 25 MG tablet Take 25 mg by mouth 3 (three) times daily as needed.    CYCLOBENZAPRINE HCL PO Take 1 tablet by mouth daily as needed.   Erenumab-aooe (AIMOVIG) 70 MG/ML SOAJ INJECT 70 MG BY SUBCUTANEOUS ROUTE ONCE MONTHLY IN HE ABDOMEN, THIGH, OR OUTER AREA OF UPPER ARM   fluocinonide ointment (LIDEX) 3.81 % APPLY 1 APPLICATION TOPICALLY 2 (TWO) TIMES DAILY.   fluticasone (FLONASE) 50 MCG/ACT nasal spray PLACE 2 SPRAYS INTO BOTH NOSTRILS DAILY.   furosemide (LASIX) 20 MG tablet TAKE 1 TABLET (20 MG TOTAL) BY MOUTH DAILY   lamoTRIgine (LAMICTAL) 100 MG tablet Take 100 mg by mouth 2 (two) times daily.   Lancets (ONETOUCH ULTRASOFT)  lancets Test fasting each morning and 2 hours before supper. Retest if having hypoglycemic symptoms.   loperamide (IMODIUM A-D) 2 MG tablet Take 1 tablet (2 mg total) by mouth 4 (four) times daily as needed for diarrhea or loose stools.   metFORMIN (GLUCOPHAGE) 500 MG tablet TAKE 2 TABLETS BY MOUTH IN THE MORNING ,1 TABLET AT LUNCH, & TAKE 2 TABLETS IN THE EVENING   metoprolol tartrate (LOPRESSOR) 100 MG tablet Take 1 tablet (100 mg total) by mouth 2 (two) times daily. NOTE NEW DOSE-100MG  BID   ONETOUCH VERIO test strip TEST FASTING GLUCOSE DAILY AS DIRECTED   oxyCODONE (OXY IR/ROXICODONE) 5 MG immediate release tablet TAKE 1 TABLET BY MOUTH EVERY 4 HOURS   promethazine (PHENERGAN) 25 MG tablet promethazine 25 mg tablet  TAKE 1 TABLET BY MOUTH AS NEEDED FOR NAUSEA   TEGRETOL-XR 400 MG 12 hr tablet TAKE 1 TABLET BY MOUTH TWICE DAILY   VIIBRYD 40 MG TABS TAKE 1 TABLET BY MOUTH EVERY DAY   [DISCONTINUED] furosemide (LASIX) 20 MG tablet TAKE 1 TABLET (20 MG TOTAL) BY MOUTH DAILY AS NEEDED FOR FLUID OR EDEMA.   [DISCONTINUED] metoprolol tartrate (LOPRESSOR) 100 MG tablet Take 1 tablet (100 mg total) by mouth 2 (two) times daily. NOTE NEW DOSE-100MG  BID     Allergies:   Morphine; Morphine and related; Oxycodone; Oxycodone hcl; and Morphine sulfate   Social History   Tobacco Use   Smoking status: Former Smoker    Types: Cigarettes    Last attempt to quit: 01/03/1983    Years since quitting: 35.7   Smokeless tobacco: Never Used   Tobacco comment: quit in 1984  Substance Use Topics   Alcohol use: No    Alcohol/week: 0.0 standard drinks   Drug use: No    Social history is notable that she is married has 2 children and 3 grandchildren. She does not routinely exercise. She quit smoking greater than 25 years ago. No alcohol use.  Family Hx: The patient's family history includes Alzheimer's disease in her maternal grandmother; Dementia in her maternal grandmother; Diabetes in her  maternal grandfather; Heart attack in her  mother; Hypertension in her maternal grandfather; Sarcoidosis in her mother; Seizures in her mother. There is no history of Breast cancer.  ROS:   Please see the history of present illness.    No fevers chills night sweats, change in smell or taste No changes in vision or hearing No cough wheezing or shortness of breath Pressure lability No chest pain PND orthopnea No abdominal discomfort No bleeding No myalgias or muscle discomfort Positive for diabetes History of seizure disorder, stable No neurologic symptoms. Sleeping better, on CPAP with improved compliance  All other systems reviewed and are negative.   Prior CV studies:   The following studies were reviewed today:  Prior cardiac catheterization April 2017  Labs/Other Tests and Data Reviewed:    EKG:  An ECG dated 03/10/2018 was personally reviewed today and demonstrated:  NSR at 65; No significant STT changes  Recent Labs: No results found for requested labs within last 8760 hours.   Recent Lipid Panel Lab Results  Component Value Date/Time   CHOL 294 (H) 02/28/2017 10:09 AM   CHOL 289 (H) 01/10/2015 10:09 AM   CHOL 277 (H) 08/16/2014 08:43 AM   TRIG 168 (H) 02/28/2017 10:09 AM   TRIG 167 (H) 08/16/2014 08:43 AM   HDL 70 02/28/2017 10:09 AM   HDL 91 01/10/2015 10:09 AM   HDL 77 08/16/2014 08:43 AM   CHOLHDL 4.2 02/28/2017 10:09 AM   LDLCALC 190 (H) 02/28/2017 10:09 AM   LDLCALC 167 (H) 08/16/2014 08:43 AM    Wt Readings from Last 3 Encounters:  09/21/18 212 lb 6.4 oz (96.3 kg)  03/20/18 213 lb 12.8 oz (97 kg)  01/17/18 216 lb (98 kg)     Objective:    Vital Signs:  Temp (!) 96.4 F (35.8 C)    Ht 5\' 3"  (1.6 m)    Wt 212 lb 6.4 oz (96.3 kg)    LMP  (LMP Unknown)    BMI 37.62 kg/m    Pressure checked earlier was 152/88 and on repeat was 157/94 She is well-developed and well-nourished in no acute distress HEENT is grossly normal. There is no apparent neck vein  distention Her breathing is normal and nonlabored There is no audible wheezing Her heart rate was regular by palpation She did not have chest wall tenderness to palpation She did not have abdominal tenderness to palpation She admits to occasional left foot swelling She denies paresthesias Positive for diabetes Her sleep has improved with CPAP Normal cognition mood and affect  ASSESSMENT & PLAN:    1. Essential hypertension: Blood pressure today is elevated.  She has had confusion about her medications.  Apparently she has been taking amlodipine 5 mg daily, Lasix 20 mg as needed but over the last several days has taken her dose, and reportedly she has 2 bottles of metoprolol tartrate 1 which is 50 mg which she is to take 1-1/2 pills twice a day and another 1 which is 100 mg.  She believes she may be inadvertently taking both doses.  I have recommended she increase amlodipine to 7.5 mg from her current 5 mg pill, increase Lasix to 20 mg daily, and have recommended that she only take metoprolol 100 mg twice a day and not take the additional metoprolol pill.  We discussed optimal blood pressure less than 120/80 with stage I hypertension beginning at 130/80 in stage II hypertension at 140/90.  We discussed sodium restriction and exercise. 2. Obstructive sleep apnea: Clinically she is feeling better and is  sleeping much better with treatment.  She typically goes to bed at midnight and make it may wake up around 6 or 6:30 in the morning.  After I had spoke with her I was able to obtain a download of her most recent use from August 22, 2018 through Sep 20, 2018.  She is compliant with 70 percentage of days used.  Her average usage is 5 hours and 40 minutes but there are days where her usage is less than 4 hours.  At 11 cm pressure AHI is 2.4.  There is no significant leak.  We discussed optimal sleep duration at 8 hours with continued CPAP therapy for the entire sleep duration.  Oftentimes she goes to bed and  may fall asleep before putting the CPAP on leading to not using it several nights. 3. Mixed hyperlipidemia: She is now on atorvastatin. Target LDL is less than 70.  She has not had recent laboratory and I have recommended she undergo a comprehensive metabolic panel, lipid studies, TSH and CBC. 4. Type 2 diabetes mellitus: She is morbidly obese with associated comorbidity.  She continues to be on metformin.  COVID-19 Education: The signs and symptoms of COVID-19 were discussed with the patient and how to seek care for testing (follow up with PCP or arrange E-visit).  The importance of social distancing was discussed today.  Time:   Today, I have spent 27 minutes with the patient with telehealth technology discussing the above problems.     Medication Adjustments/Labs and Tests Ordered: Current medicines are reviewed at length with the patient today.  Concerns regarding medicines are outlined above.   Tests Ordered: Orders Placed This Encounter  Procedures   Comprehensive metabolic panel   CBC   TSH   Lipid panel    Medication Changes: Meds ordered this encounter  Medications   amLODipine (NORVASC) 5 MG tablet    Sig: Take 1.5 tablets (7.5 mg total) by mouth daily.    Dispense:  135 tablet    Refill:  1   furosemide (LASIX) 20 MG tablet    Sig: TAKE 1 TABLET (20 MG TOTAL) BY MOUTH DAILY    Dispense:  90 tablet    Refill:  1   metoprolol tartrate (LOPRESSOR) 100 MG tablet    Sig: Take 1 tablet (100 mg total) by mouth 2 (two) times daily. NOTE NEW DOSE-100MG  BID    Dispense:  180 tablet    Refill:  1    Disposition:  Follow up 3 months  Signed, Shelva Majestic, MD  09/21/2018 5:13 PM    [facility]

## 2018-09-23 ENCOUNTER — Other Ambulatory Visit: Payer: Self-pay | Admitting: Cardiovascular Disease

## 2018-10-10 DIAGNOSIS — G4733 Obstructive sleep apnea (adult) (pediatric): Secondary | ICD-10-CM | POA: Diagnosis not present

## 2018-10-19 DIAGNOSIS — G4733 Obstructive sleep apnea (adult) (pediatric): Secondary | ICD-10-CM | POA: Diagnosis not present

## 2018-10-21 ENCOUNTER — Other Ambulatory Visit: Payer: Self-pay | Admitting: Family Medicine

## 2018-10-21 DIAGNOSIS — F331 Major depressive disorder, recurrent, moderate: Secondary | ICD-10-CM

## 2018-10-22 ENCOUNTER — Other Ambulatory Visit: Payer: Self-pay | Admitting: Family Medicine

## 2018-10-29 IMAGING — MR MR HEAD W/O CM
10 series · 48 of 48 positions shown · non-contrast
Comparison: Brain MRI 01/23/2016, head CTs 01/23/2016 and earlier.

CLINICAL DATA: 56-year-old female with progressive headache
symptoms for the past 2-3 months. No known injury.

EXAM:
MRI HEAD WITHOUT CONTRAST
TECHNIQUE: Multiplanar, multiecho pulse sequences of the brain and surrounding
structures were obtained without intravenous contrast.

[Series 2: t1_se_sag · sagittal · 5.0mm · 0.45mm/px · 2 of 21 slices shown]
[im 1/21]
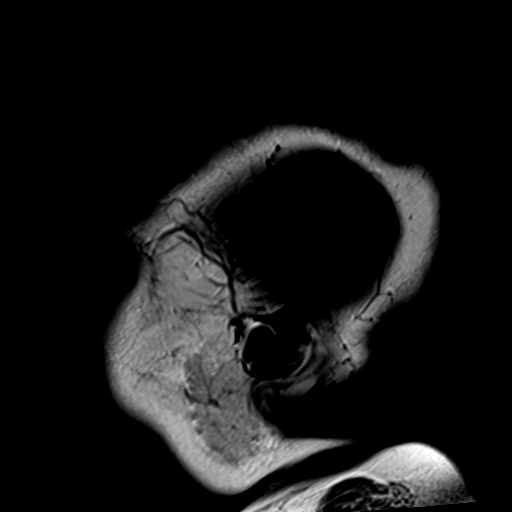
[im 21/21]
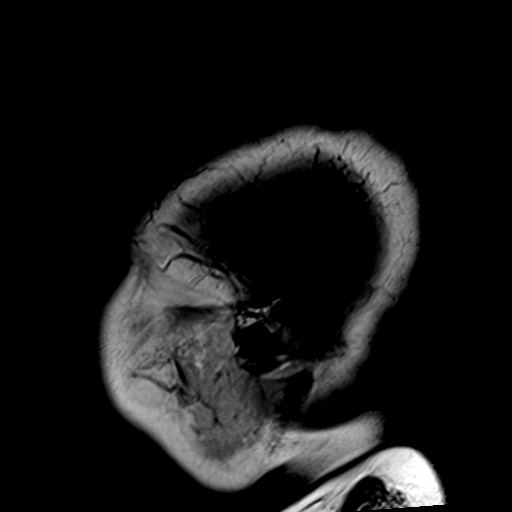

[Series 3: ep2d_diff_cor · coronal · 5.0mm · 1.77mm/px · 5 of 56 slices shown]
[im 1/56]
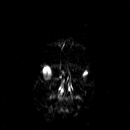
[im 14/56]
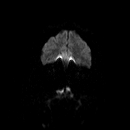
[im 28/56]
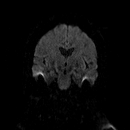
[im 42/56]
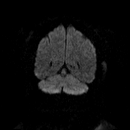
[im 56/56]
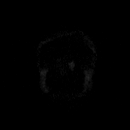

[Series 4: ep2d_diff_cor_adc · coronal · 5.0mm · 1.77mm/px · 3 of 28 slices shown]
[im 1/28]
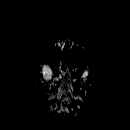
[im 14/28]
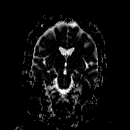
[im 28/28]
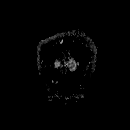

[Series 5: T2 · coronal · 5.0mm · 0.45mm/px · 3 of 28 slices shown]
[im 1/28]
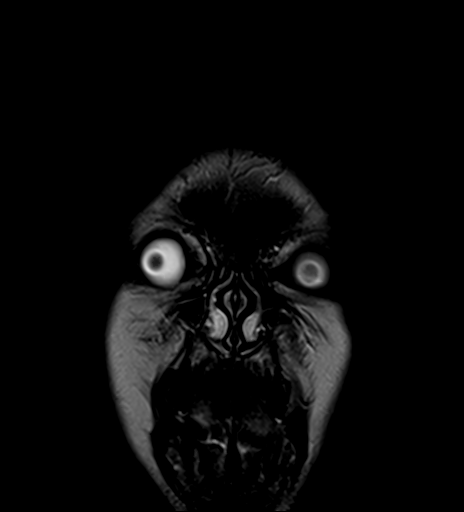
[im 14/28]
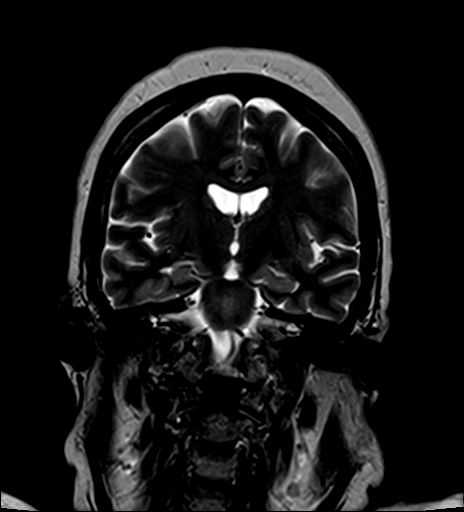
[im 28/28]
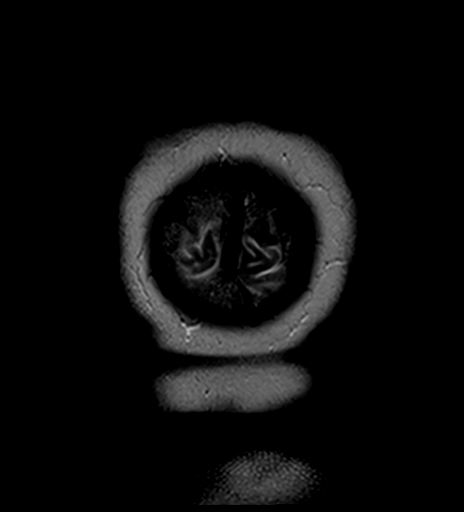

[Series 6: t2_tse_tra_512 · axial · 5.0mm · 0.60mm/px · z∈[-62,+76]mm · 2 of 24 slices shown]
[im 1/24]
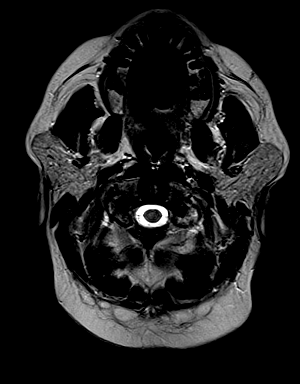
[im 24/24]
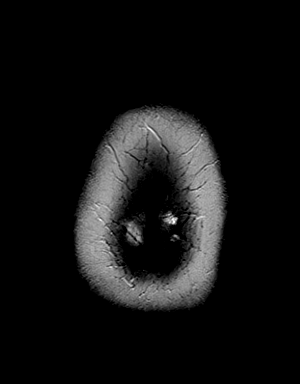

[Series 7: ep2d_diff_(id)_trace · axial · 3.0mm · 1.80mm/px · z∈[-58,+76]mm · 9 of 92 slices shown]
[im 1/92]
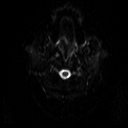
[im 12/92]
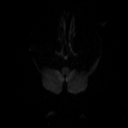
[im 23/92]
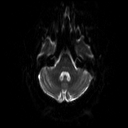
[im 35/92]
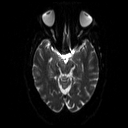
[im 46/92]
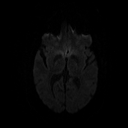
[im 57/92]
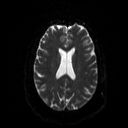
[im 69/92]
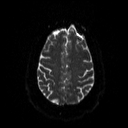
[im 80/92]
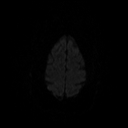
[im 92/92]
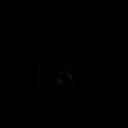

[Series 8: ep2d_diff_(id)_trace_adc · axial · 3.0mm · 1.80mm/px · z∈[-58,+76]mm · 4 of 46 slices shown]
[im 1/46]
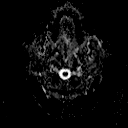
[im 16/46]
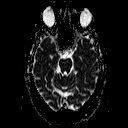
[im 31/46]
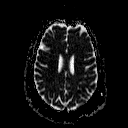
[im 46/46]
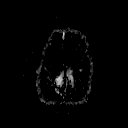

[Series 10: swi_images · axial · 4.0mm · 0.90mm/px · z∈[-63,+77]mm · 3 of 36 slices shown]
[im 1/36]
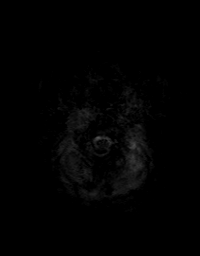
[im 18/36]
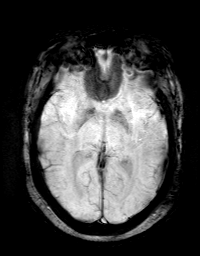
[im 36/36]
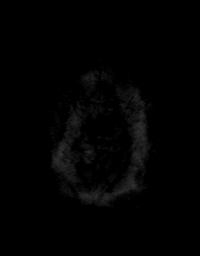

[Series 11: FLAIR · axial · 3.0mm · 0.43mm/px · z∈[-61,+78]mm · 3 of 30 slices shown]
[im 1/30]
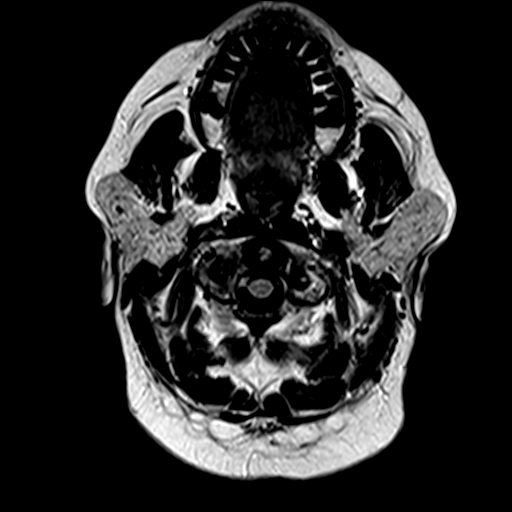
[im 15/30]
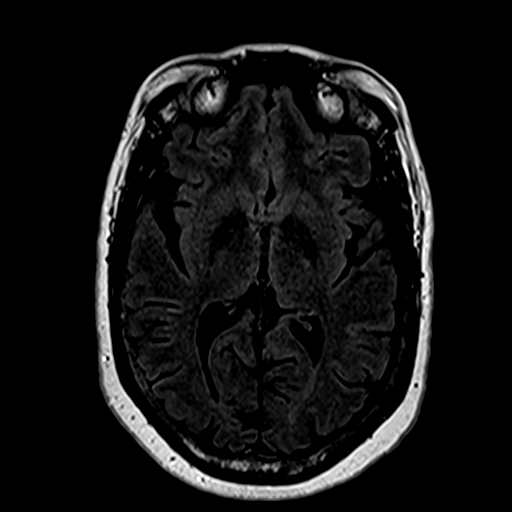
[im 30/30]
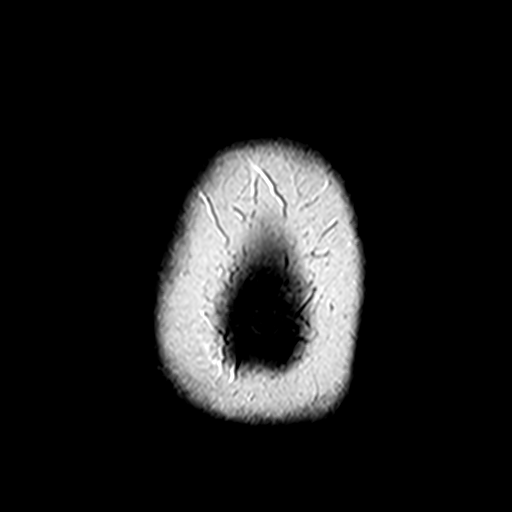

[Series 12: t1_mpr_tra · axial · 1.0mm · 0.72mm/px · z∈[-64,+78]mm · 14 of 144 slices shown]
[im 1/144]
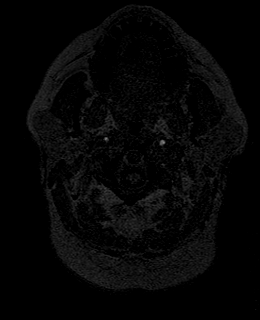
[im 12/144]
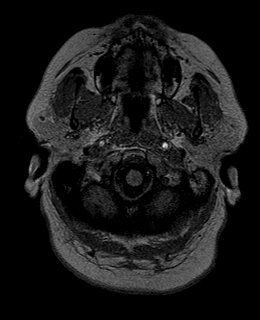
[im 23/144]
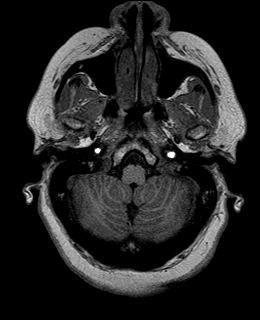
[im 34/144]
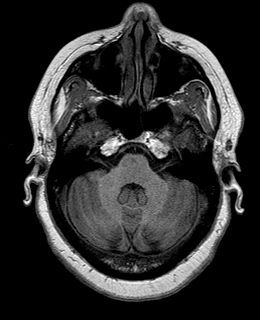
[im 45/144]
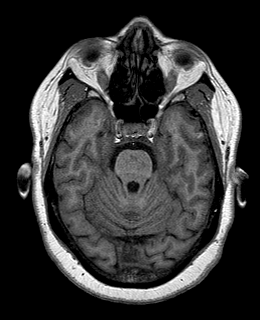
[im 56/144]
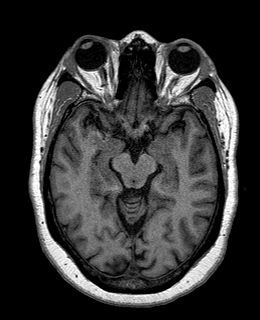
[im 67/144]
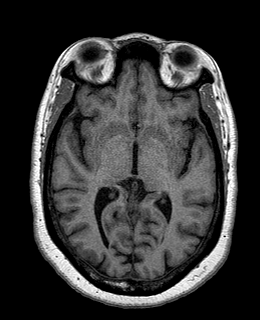
[im 78/144]
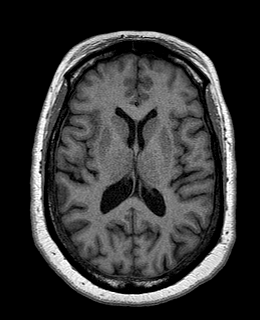
[im 89/144]
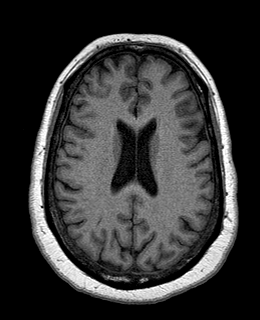
[im 100/144]
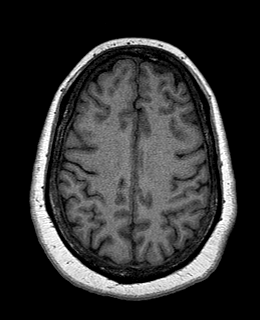
[im 111/144]
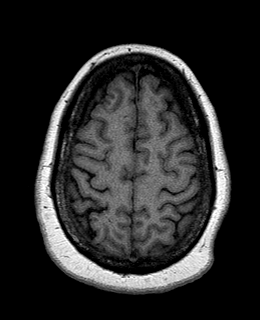
[im 122/144]
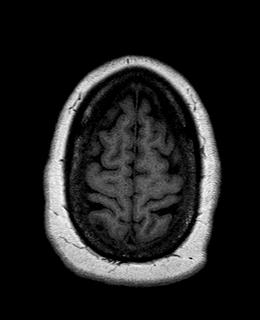
[im 133/144]
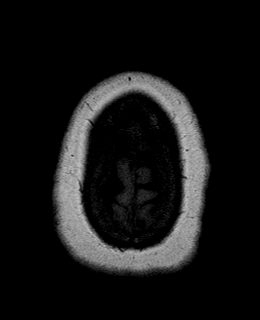
[im 144/144]
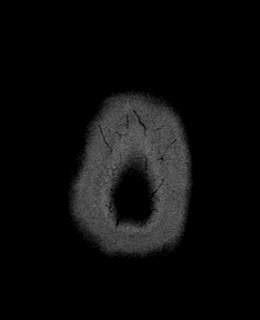

[48 of 48 positions shown; findings below may reference images not displayed]

FINDINGS: Brain: Normal cerebral volume. No restricted diffusion to suggest
acute infarction. No midline shift, mass effect, evidence of mass
lesion, ventriculomegaly, extra-axial collection or acute
intracranial hemorrhage. Cervicomedullary junction and pituitary are
within normal limits.

Minimal for age nonspecific cerebral white matter T2 and FLAIR
hyperintensity (right frontal operculum series 11, image 19), and
mild to moderate chronic patchy T2 hyperintensity in the pons are
stable since 6570. No cortical encephalomalacia. No chronic cerebral
blood products are identified. The bilateral deep gray matter nuclei
and cerebellum remain normal.

Vascular: Major intracranial vascular flow voids are stable and
appear normal.

Skull and upper cervical spine: Negative visible cervical spine.
Skull bone marrow signal is stable and within normal limits.

Sinuses/Orbits: Mildly Disconjugate gaze appears chronic. Orbits
soft tissues otherwise appear normal. Paranasal sinuses and mastoids
are stable and well pneumatized.

Other: Visible internal auditory structures appear normal. The scalp
and face soft tissues appear negative.
IMPRESSION: 1.  No acute intracranial abnormality.
2. Stable since 6570 and normal for age aside from chronic
nonspecific signal abnormality in the pons which could be related to
small vessel disease.

## 2018-10-31 DIAGNOSIS — G518 Other disorders of facial nerve: Secondary | ICD-10-CM | POA: Diagnosis not present

## 2018-10-31 DIAGNOSIS — G43719 Chronic migraine without aura, intractable, without status migrainosus: Secondary | ICD-10-CM | POA: Diagnosis not present

## 2018-10-31 DIAGNOSIS — M791 Myalgia, unspecified site: Secondary | ICD-10-CM | POA: Diagnosis not present

## 2018-10-31 DIAGNOSIS — M542 Cervicalgia: Secondary | ICD-10-CM | POA: Diagnosis not present

## 2018-11-06 ENCOUNTER — Other Ambulatory Visit: Payer: Self-pay | Admitting: Cardiovascular Disease

## 2018-11-09 DIAGNOSIS — G4733 Obstructive sleep apnea (adult) (pediatric): Secondary | ICD-10-CM | POA: Diagnosis not present

## 2018-11-22 ENCOUNTER — Other Ambulatory Visit: Payer: Self-pay | Admitting: Cardiovascular Disease

## 2018-11-30 ENCOUNTER — Telehealth: Payer: Self-pay

## 2018-11-30 NOTE — Telephone Encounter (Signed)
LMTCB to schedule telephonic AWV.

## 2018-12-04 NOTE — Telephone Encounter (Signed)
Scheduled telephonic AWV for 12/12/18 @ 10:00 AM.

## 2018-12-06 ENCOUNTER — Ambulatory Visit: Payer: Medicare Other | Admitting: Cardiovascular Disease

## 2018-12-12 ENCOUNTER — Other Ambulatory Visit: Payer: Self-pay

## 2018-12-12 ENCOUNTER — Ambulatory Visit (INDEPENDENT_AMBULATORY_CARE_PROVIDER_SITE_OTHER): Payer: Medicare Other

## 2018-12-12 DIAGNOSIS — Z0000 Encounter for general adult medical examination without abnormal findings: Secondary | ICD-10-CM

## 2018-12-12 NOTE — Progress Notes (Addendum)
Subjective:   Abigail Figueroa is a 58 y.o. female who presents for Medicare Annual (Subsequent) preventive examination.    This visit is being conducted through telemedicine due to the COVID-19 pandemic. This patient has given me verbal consent via doximity to conduct this visit, patient states they are participating from their home address. Some vital signs may be absent or patient reported.    Patient identification: identified by name, DOB, and current address  Review of Systems:  N/A  Cardiac Risk Factors include: diabetes mellitus;dyslipidemia;hypertension     Objective:     Vitals: LMP  (LMP Unknown)   There is no height or weight on file to calculate BMI. Unable to obtain vitals due to visit being conducted via telephonically.   Advanced Directives 12/12/2018 11/08/2017 08/30/2017 07/15/2017 07/20/2016 01/23/2016 08/22/2015  Does Patient Have a Medical Advance Directive? Yes Yes Yes Yes Yes No Yes  Type of Paramedic of [place];Living will Living will Living will [place];Living will Living will;Healthcare Power of [place] will  Does patient want to make changes to medical advance directive? - No - Patient declined - - - - No - Patient declined  Copy of [place] in Chart? No - copy requested - - No - copy requested No - copy requested - No - copy requested  Would patient like information on creating a medical advance directive? - - - - - Yes - Educational materials given -  Some encounter information is confidential and restricted. Go to Review Flowsheets activity to see all data.    Tobacco Social History   Tobacco Use  Smoking Status Former Smoker  . Types: Cigarettes  . Quit date: 01/03/1983  . Years since quitting: 35.9  Smokeless Tobacco Never Used  Tobacco Comment   quit in 1984     Counseling given: Not Answered Comment: quit in 1984   Clinical Intake:  Pre-visit preparation completed:  Yes  Pain : No/denies pain Pain Score: 0-No pain     Nutritional Risks: None Diabetes: Yes  How often do you need to have someone help you when you read instructions, pamphlets, or other written materials from your doctor or pharmacy?: 1 - Never   Diabetes:  Is the patient diabetic?  Yes  type 2 If diabetic, was a CBG obtained today?  No  Did the patient bring in their glucometer from home?  No  How often do you monitor your CBG's? Once daily.   Financial Strains and Diabetes Management:  Are you having any financial strains with the device, your supplies or your medication? No .  Does the patient want to be seen by Chronic Care Management for management of their diabetes?  No  Would the patient like to be referred to a Nutritionist or for Diabetic Management?  No   Diabetic Exams:  Diabetic Eye Exam: Completed 02/12/16. Overdue for diabetic eye exam. Pt has been advised about the importance in completing this exam.   Diabetic Foot Exam: Completed 02/28/17. Pt has been advised about the importance in completing this exam. Note made to follow up on this at next in office visit.     Interpreter Needed?: No     Past Medical History:  Diagnosis Date  . Abnormal stress test    a. 10/2009: Normal myocardial perfusion imaging; b. 08/2015 MV: medium defect of mod severity in mid ant apical region w/ mild HK of the distal inf wall;  c. 08/2015 Cath:  nl Cors, EF 60%.  . Anemia   . Anxiety   . Arthritis   . Bell's palsy   . Depression   . Edema   . Essential hypertension   . Frequent urination   . Frequent urination at night   . Headache    migraines, gets botox injections  . Heart palpitations June 2013   Event monitor showing sinus tachycardia, PACs with couplets and triplets.  Marland Kitchen Hx of echocardiogram    a. 10/2009 Echo: showed normal left ventricular function, mild left ventricular hypertrophy, and no significant valve abnormalities.  . Hypercholesterolemia   . Migraine    . Morbid obesity ([place])   . Neuromuscular disorder ([place])   . OSA (obstructive sleep apnea)    has been on continuous positive airway pressure  . Seizure disorder ([place])   . Seizures ([place])   . Type II diabetes mellitus ([place])    Past Surgical History:  Procedure Laterality Date  . ABDOMINAL HYSTERECTOMY  1990   without BSO  . BACK SURGERY  1900s 2001   x2 with "cage put in"  . BILATERAL SALPINGOOPHORECTOMY  1990  . CARDIAC CATHETERIZATION N/A 08/06/2015   Procedure: Left Heart Cath and Coronary Angiography;  Surgeon: Troy Sine, MD;  Location: [facility];  Service: Cardiovascular;  Laterality: N/A;  . CARPAL TUNNEL RELEASE Bilateral   . CHOLECYSTECTOMY  1980  . COLONOSCOPY WITH PROPOFOL N/A 08/30/2017   Procedure: COLONOSCOPY WITH PROPOFOL;  Surgeon: Lin Landsman, MD;  Location: [facility];  Service: Endoscopy;  Laterality: N/A;  . DG THUMB LEFT HAND  01/24/2018   repair joint in left thumb  . DORSAL COMPARTMENT RELEASE Left 10/25/2014   Procedure: LEFT FIRST  DORSAL COMPARTMENT RELEASE AND RADIAL TENOSYNOVECTOMY ;  Surgeon: Roseanne Kaufman, MD;  Location: [facility];  Service: Orthopedics;  Laterality: Left;  . HAND SURGERY    . KNEE SURGERY Right 2012   meniscus tear  . KNEE SURGERY  2012  . LAMINECTOMY  1995  . TUBAL LIGATION  1982  . WRIST SURGERY Right    Family History  Problem Relation Age of Onset  . Sarcoidosis Mother   . Seizures Mother   . Heart attack Mother   . Alzheimer's disease Maternal Grandmother   . Dementia Maternal Grandmother   . Hypertension Maternal Grandfather   . Diabetes Maternal Grandfather   . Breast cancer Neg Hx    Social History   Socioeconomic History  . Marital status: Married    Spouse name: Not on file  . Number of children: 2  . Years of education: Not on file  . Highest education level: 12th grade  Occupational History  . Occupation: disabled  Social Needs  . Financial resource strain: Not hard  at all  . Food insecurity    Worry: Never true    Inability: Never true  . Transportation needs    Medical: No    Non-medical: No  Tobacco Use  . Smoking status: Former Smoker    Types: Cigarettes    Quit date: 01/03/1983    Years since quitting: 35.9  . Smokeless tobacco: Never Used  . Tobacco comment: quit in 1984  Substance and Sexual Activity  . Alcohol use: No    Alcohol/week: 0.0 standard drinks  . Drug use: No  . Sexual activity: Never  Lifestyle  . Physical activity    Days per week: 0 days    Minutes per session: 0 min  .  Stress: Rather much  Relationships  . Social Herbalist on phone: Patient refused    Gets together: Patient refused    Attends religious service: Patient refused    Active member of club or organization: Patient refused    Attends meetings of clubs or organizations: Patient refused    Relationship status: Patient refused  Other Topics Concern  . Not on file  Social History Narrative   ** Merged History Encounter **       She is a married mother of 2, grandmother 3. She tries to get exercise but is not doing any routine program.   She quit smoking over 25 years ago does not drink alcohol.    Outpatient Encounter Medications as of 12/12/2018  Medication Sig  . amLODipine (NORVASC) 5 MG tablet Take 1.5 tablets (7.5 mg total) by mouth daily.  Marland Kitchen atorvastatin (LIPITOR) 40 MG tablet TAKE 1 TABLET BY MOUTH EVERY DAY  . baclofen (LIORESAL) 10 MG tablet Take 10 mg by mouth 3 (three) times daily as needed.  Marland Kitchen BOTOX 100 UNITS SOLR injection Inject into the muscle every 3 (three) months.   . chlorproMAZINE (THORAZINE) 25 MG tablet Take 25 mg by mouth 3 (three) times daily as needed.   . clindamycin (CLEOCIN T) 1 % lotion APPLY TO AFFECTED AREA OF THE FACE AS NEEDED FOR ACNE  . CYCLOBENZAPRINE HCL PO Take 1 tablet by mouth daily as needed.  Eduard Roux (AIMOVIG) 80 MG/ML SOAJ INJECT 58 MG BY SUBCUTANEOUS ROUTE ONCE MONTHLY IN HE ABDOMEN,  THIGH, OR OUTER AREA OF UPPER ARM  . fluocinonide ointment (LIDEX) 7.16 % APPLY 1 APPLICATION TOPICALLY 2 (TWO) TIMES DAILY.  . fluticasone (FLONASE) 50 MCG/ACT nasal spray PLACE 2 SPRAYS INTO BOTH NOSTRILS DAILY. (Patient taking differently: PLACE 2 SPRAYS INTO BOTH NOSTRILS DAILY AS NEEDED.)  . furosemide (LASIX) 20 MG tablet TAKE 1 TABLET (20 MG TOTAL) BY MOUTH DAILY  . lamoTRIgine (LAMICTAL) 100 MG tablet Take 100 mg by mouth 2 (two) times daily.  . Lancets (ONETOUCH ULTRASOFT) lancets Test fasting each morning and 2 hours before supper. Retest if having hypoglycemic symptoms.  Marland Kitchen levocetirizine (XYZAL) 5 MG tablet every evening.  . loperamide (IMODIUM A-D) 2 MG tablet Take 1 tablet (2 mg total) by mouth 4 (four) times daily as needed for diarrhea or loose stools.  . metFORMIN (GLUCOPHAGE) 500 MG tablet TAKE 2 TABLETS BY MOUTH IN THE MORNING ,1 TABLET AT LUNCH, & TAKE 2 TABLETS IN THE EVENING (Patient taking differently: TAKE 2 TABLETS BY MOUTH IN THE MORNING, 1 TABLET AT LUNCH & TAKE 2 TABLETS IN THE EVENING)  . metoprolol tartrate (LOPRESSOR) 50 MG tablet Take 2 tablets (100 mg total) by mouth 2 (two) times daily. (Patient taking differently: Take 75 mg by mouth 2 (two) times daily. )  . ONETOUCH VERIO test strip TEST FASTING GLUCOSE DAILY AS DIRECTED  . oxyCODONE (OXY IR/ROXICODONE) 5 MG immediate release tablet Take 5 mg by mouth as needed.   . promethazine (PHENERGAN) 25 MG tablet promethazine 25 mg tablet  TAKE 1 TABLET BY MOUTH AS NEEDED FOR NAUSEA  . TEGRETOL-XR 400 MG 12 hr tablet TAKE 1 TABLET BY MOUTH TWICE DAILY  . VIIBRYD 40 MG TABS TAKE 1 TABLET BY MOUTH EVERY DAY  . metoprolol tartrate (LOPRESSOR) 100 MG tablet Take 1 tablet (100 mg total) by mouth 2 (two) times daily. NOTE NEW DOSE-100MG  BID (Patient not taking: Reported on 12/12/2018)   No facility-administered encounter medications  on file as of 12/12/2018.     Activities of Daily Living In your present state of health, do  you have any difficulty performing the following activities: 12/12/2018  Hearing? N  Vision? N  Comment Wears eye glasses daily.  Difficulty concentrating or making decisions? Y  Walking or climbing stairs? N  Dressing or bathing? N  Doing errands, shopping? N  Preparing Food and eating ? N  Using the Toilet? N  In the past six months, have you accidently leaked urine? Y  Comment Wears protection daily.  Do you have problems with loss of bowel control? N  Managing your Medications? N  Managing your Finances? Y  Comment Daughter manages finances.  Housekeeping or managing your Housekeeping? N  Some recent data might be hidden    Patient Care Team: Chrismon, Vickki Muff, PA as PCP - General (Physician Assistant) Troy Sine, MD as PCP - Cardiology (Cardiology) Orie Rout, MD as Referring Physician (Specialist) Newman Pies, MD as Consulting Physician (Neurosurgery) Dingeldein, Remo Lipps, MD as Consulting Physician (Ophthalmology) Bernell List, CPhT as [place] [facility] Management (Pharmacy Technician)    Assessment:   This is a routine wellness examination for Miguel.  Exercise Activities and Dietary recommendations Current Exercise Habits: The patient does not participate in regular exercise at present, Exercise limited by: orthopedic condition(s)  Goals    . DIET - DECREASE SODA OR JUICE INTAKE     Recommend to continue current plan to cut out all sodas in diet to help aid in weight loss.     . Increase water intake     Recommend to continue drinking 40 oz of water a day.       Fall Risk: Fall Risk  12/12/2018 07/15/2017 02/28/2017 07/20/2016  Falls in the past year? 1 No No No  Number falls in past yr: 1 - - -  Injury with Fall? 0 - - -  Risk for fall due to : - - - Impaired mobility  Follow up Falls prevention discussed - - -    FALL RISK PREVENTION PERTAINING TO THE HOME:  Any stairs in or around the home? Yes  If so, are there any  without handrails? No   Home free of loose throw rugs in walkways, pet beds, electrical cords, etc? Yes  Adequate lighting in your home to reduce risk of falls? Yes   ASSISTIVE DEVICES UTILIZED TO PREVENT FALLS:  Life alert? No  Use of a cane, walker or w/c? No  Grab bars in the bathroom? Yes  Shower chair or bench in shower? No  Elevated toilet seat or a handicapped toilet? No   TIMED UP AND GO:  Was the test performed? No .    Depression Screen PHQ 2/9 Scores 12/12/2018 07/15/2017 02/28/2017 07/20/2016  PHQ - 2 Score 5 3 2 2   PHQ- 9 Score 11 7 4 7      Cognitive Function     6CIT Screen 12/12/2018 07/20/2016  What Year? 0 points 0 points  What month? 0 points 0 points  What time? 0 points 0 points  Count back from 20 0 points 0 points  Months in reverse 0 points 0 points  Repeat phrase 0 points 10 points  Total Score 0 10    Immunization History  Administered Date(s) Administered  . Influenza,inj,Quad PF,6+ Mos 02/28/2017, 01/17/2018  . Pneumococcal Polysaccharide-23 01/17/2018  . Tdap 10/20/2006     Tdap: Although this vaccine is not a covered service during a Wellness  Exam, does the patient still wish to receive this vaccine today?  No .   Flu Vaccine: Due fall 2020   Screening Tests Health Maintenance  Topic Date Due  . PAP SMEAR-Modifier  10/13/1981  . TETANUS/TDAP  10/19/2016  . OPHTHALMOLOGY EXAM  02/11/2017  . URINE MICROALBUMIN  07/20/2017  . HEMOGLOBIN A1C  08/29/2017  . FOOT EXAM  02/28/2018  . INFLUENZA VACCINE  12/02/2018  . MAMMOGRAM  07/30/2019  . COLONOSCOPY  08/31/2022  . PNEUMOCOCCAL POLYSACCHARIDE VACCINE AGE 66-64 HIGH RISK  Completed  . Hepatitis C Screening  Completed  . HIV Screening  Completed    Cancer Screenings:  Colorectal Screening: Completed 08/30/17. Repeat every 5 years.  Mammogram: Completed 07/29/17.   Lung Cancer Screening: (Low Dose CT Chest recommended if Age 47-80 years, 30 pack-year currently smoking OR have  quit w/in 15years.) does not qualify.   Additional Screening:  Hepatitis C Screening: Up to date  Dental Screening: Recommended annual dental exams for proper oral hygiene   Community Resource Referral:  CRR required this visit?  No       Plan:  I have personally reviewed and addressed the Medicare Annual Wellness questionnaire and have noted the following in the patient's chart:  A. Medical and social history B. Use of alcohol, tobacco or illicit drugs  C. Current medications and supplements D. Functional ability and status E.  Nutritional status F.  Physical activity G. Advance directives H. List of other physicians I.  Hospitalizations, surgeries, and ER visits in previous 12 months J.  [place] such as hearing and vision if needed, cognitive and depression L. Referrals and appointments   In addition, I have reviewed and discussed with patient certain preventive protocols, quality metrics, and best practice recommendations. A written personalized care plan for preventive services as well as general preventive health recommendations were provided to patient.   Gl[place]ora Score, [place]  1/59/4585 Nurse Health Advisor   Nurse Notes: Pt needs a urine check, Hgb A1c check diabetic foot exam and pap smear at next in office visit. Pt declined a future order for a tetanus vaccine. Pt to set up an eye exam this year.   Reviewed note of Nurse Health Advisor for [facility]. Was available for consultation during screening. Agree with documentation and recommendations.

## 2018-12-12 NOTE — Patient Instructions (Signed)
Ms. Abigail Figueroa , Thank you for taking time to come for your Medicare Wellness Visit. I appreciate your ongoing commitment to your health goals. Please review the following plan we discussed and let me know if I can assist you in the future.   Screening recommendations/referrals: Colonoscopy: Up to date, due 08/2022 Mammogram: Up to date, due 07/2019 Recommended yearly ophthalmology/optometry visit for glaucoma screening and checkup Recommended yearly dental visit for hygiene and checkup  Vaccinations: Influenza vaccine: Due fall 2020 Tdap vaccine: Pt declines today.     Advanced directives: Please bring a copy of your POA (Power of Attorney) and/or Living Will to your next appointment.   Conditions/risks identified: Continue to increase water intake to 6-8 8 oz glasses a day.   Next appointment: 02/06/19 @ 10:40 AM with Vernie Murders.  Preventive Care 40-64 Years, Female Preventive care refers to lifestyle choices and visits with your health care provider that can promote health and wellness. What does preventive care include?  A yearly physical exam. This is also called an annual well check.  Dental exams once or twice a year.  Routine eye exams. Ask your health care provider how often you should have your eyes checked.  Personal lifestyle choices, including:  Daily care of your teeth and gums.  Regular physical activity.  Eating a healthy diet.  Avoiding tobacco and drug use.  Limiting alcohol use.  Practicing safe sex.  Taking low-dose aspirin daily starting at age 88.  Taking vitamin and mineral supplements as recommended by your health care provider. What happens during an annual well check? The services and screenings done by your health care provider during your annual well check will depend on your age, overall health, lifestyle risk factors, and family history of disease. Counseling  Your health care provider may ask you questions about your:  Alcohol use.   Tobacco use.  Drug use.  Emotional well-being.  Home and relationship well-being.  Sexual activity.  Eating habits.  Work and work Statistician.  Method of birth control.  Menstrual cycle.  Pregnancy history. Screening  You may have the following tests or measurements:  Height, weight, and BMI.  Blood pressure.  Lipid and cholesterol levels. These may be checked every 5 years, or more frequently if you are over 43 years old.  Skin check.  Lung cancer screening. You may have this screening every year starting at age 50 if you have a 30-pack-year history of smoking and currently smoke or have quit within the past 15 years.  Fecal occult blood test (FOBT) of the stool. You may have this test every year starting at age 37.  Flexible sigmoidoscopy or colonoscopy. You may have a sigmoidoscopy every 5 years or a colonoscopy every 10 years starting at age 28.  Hepatitis C blood test.  Hepatitis B blood test.  Sexually transmitted disease (STD) testing.  Diabetes screening. This is done by checking your blood sugar (glucose) after you have not eaten for a while (fasting). You may have this done every 1-3 years.  Mammogram. This may be done every 1-2 years. Talk to your health care provider about when you should start having regular mammograms. This may depend on whether you have a family history of breast cancer.  BRCA-related cancer screening. This may be done if you have a family history of breast, ovarian, tubal, or peritoneal cancers.  Pelvic exam and Pap test. This may be done every 3 years starting at age 56. Starting at age 38, this may be done  every 5 years if you have a Pap test in combination with an HPV test.  Bone density scan. This is done to screen for osteoporosis. You may have this scan if you are at high risk for osteoporosis. Discuss your test results, treatment options, and if necessary, the need for more tests with your health care provider. Vaccines   Your health care provider may recommend certain vaccines, such as:  Influenza vaccine. This is recommended every year.  Tetanus, diphtheria, and acellular pertussis (Tdap, Td) vaccine. You may need a Td booster every 10 years.  Zoster vaccine. You may need this after age 85.  Pneumococcal 13-valent conjugate (PCV13) vaccine. You may need this if you have certain conditions and were not previously vaccinated.  Pneumococcal polysaccharide (PPSV23) vaccine. You may need one or two doses if you smoke cigarettes or if you have certain conditions. Talk to your health care provider about which screenings and vaccines you need and how often you need them. This information is not intended to replace advice given to you by your health care provider. Make sure you discuss any questions you have with your health care provider. Document Released: 05/16/2015 Document Revised: 01/07/2016 Document Reviewed: 02/18/2015 Elsevier Interactive Patient Education  2017 [employer] Prevention in the Home Falls can cause injuries. They can happen to people of all ages. There are many things you can do to make your home safe and to help prevent falls. What can I do on the outside of my home?  Regularly fix the edges of walkways and driveways and fix any cracks.  Remove anything that might make you trip as you walk through a door, such as a raised step or threshold.  Trim any bushes or trees on the path to your home.  Use bright outdoor lighting.  Clear any walking paths of anything that might make someone trip, such as rocks or tools.  Regularly check to see if handrails are loose or broken. Make sure that both sides of any steps have handrails.  Any raised decks and porches should have guardrails on the edges.  Have any leaves, snow, or ice cleared regularly.  Use sand or salt on walking paths during winter.  Clean up any spills in your garage right away. This includes oil or grease  spills. What can I do in the bathroom?  Use night lights.  Install grab bars by the toilet and in the tub and shower. Do not use towel bars as grab bars.  Use non-skid mats or decals in the tub or shower.  If you need to sit down in the shower, use a plastic, non-slip stool.  Keep the floor dry. Clean up any water that spills on the floor as soon as it happens.  Remove soap buildup in the tub or shower regularly.  Attach bath mats securely with double-sided non-slip rug tape.  Do not have throw rugs and other things on the floor that can make you trip. What can I do in the bedroom?  Use night lights.  Make sure that you have a light by your bed that is easy to reach.  Do not use any sheets or blankets that are too big for your bed. They should not hang down onto the floor.  Have a firm chair that has side arms. You can use this for support while you get dressed.  Do not have throw rugs and other things on the floor that can make you trip. What can  I do in the kitchen?  Clean up any spills right away.  Avoid walking on wet floors.  Keep items that you use a lot in easy-to-reach places.  If you need to reach something above you, use a strong step stool that has a grab bar.  Keep electrical cords out of the way.  Do not use floor polish or wax that makes floors slippery. If you must use wax, use non-skid floor wax.  Do not have throw rugs and other things on the floor that can make you trip. What can I do with my stairs?  Do not leave any items on the stairs.  Make sure that there are handrails on both sides of the stairs and use them. Fix handrails that are broken or loose. Make sure that handrails are as long as the stairways.  Check any carpeting to make sure that it is firmly attached to the stairs. Fix any carpet that is loose or worn.  Avoid having throw rugs at the top or bottom of the stairs. If you do have throw rugs, attach them to the floor with carpet  tape.  Make sure that you have a light switch at the top of the stairs and the bottom of the stairs. If you do not have them, ask someone to add them for you. What else can I do to help prevent falls?  Wear shoes that:  Do not have high heels.  Have rubber bottoms.  Are comfortable and fit you well.  Are closed at the toe. Do not wear sandals.  If you use a stepladder:  Make sure that it is fully opened. Do not climb a closed stepladder.  Make sure that both sides of the stepladder are locked into place.  Ask someone to hold it for you, if possible.  Clearly mark and make sure that you can see:  Any grab bars or handrails.  First and last steps.  Where the edge of each step is.  Use tools that help you move around (mobility aids) if they are needed. These include:  Canes.  Walkers.  Scooters.  Crutches.  Turn on the lights when you go into a dark area. Replace any light bulbs as soon as they burn out.  Set up your furniture so you have a clear path. Avoid moving your furniture around.  If any of your floors are uneven, fix them.  If there are any pets around you, be aware of where they are.  Review your medicines with your doctor. Some medicines can make you feel dizzy. This can increase your chance of falling. Ask your doctor what other things that you can do to help prevent falls. This information is not intended to replace advice given to you by your health care provider. Make sure you discuss any questions you have with your health care provider. Document Released: 02/13/2009 Document Revised: 09/25/2015 Document Reviewed: 05/24/2014 Elsevier Interactive Patient Education  2017 [employer].

## 2018-12-28 ENCOUNTER — Other Ambulatory Visit: Payer: Self-pay | Admitting: Family Medicine

## 2018-12-28 DIAGNOSIS — G43719 Chronic migraine without aura, intractable, without status migrainosus: Secondary | ICD-10-CM | POA: Diagnosis not present

## 2018-12-28 DIAGNOSIS — M542 Cervicalgia: Secondary | ICD-10-CM | POA: Diagnosis not present

## 2018-12-28 DIAGNOSIS — G518 Other disorders of facial nerve: Secondary | ICD-10-CM | POA: Diagnosis not present

## 2018-12-28 DIAGNOSIS — M791 Myalgia, unspecified site: Secondary | ICD-10-CM | POA: Diagnosis not present

## 2019-01-15 ENCOUNTER — Other Ambulatory Visit: Payer: Self-pay | Admitting: Family Medicine

## 2019-01-17 ENCOUNTER — Other Ambulatory Visit: Payer: Self-pay | Admitting: Family Medicine

## 2019-01-28 ENCOUNTER — Other Ambulatory Visit: Payer: Self-pay | Admitting: Cardiovascular Disease

## 2019-01-31 ENCOUNTER — Other Ambulatory Visit: Payer: Self-pay | Admitting: Cardiovascular Disease

## 2019-01-31 NOTE — Telephone Encounter (Signed)
° ° °*  STAT* If patient is at the pharmacy, call can be transferred to refill team.   1. Which medications need to be refilled? (please list name of each medication and dose if known)  metoprolol tartrate (LOPRESSOR) 50 MG tablet   Patient reports she takes 1.5 tablets BID   2. Which pharmacy/location (including street and city if local pharmacy) is medication to be sent to?    [id number] - [address], [place]      3. Do they need a 30 day or 90 day supply? 90   Patient is out of medication

## 2019-02-01 MED ORDER — METOPROLOL TARTRATE 50 MG PO TABS: 75.0000 mg | ORAL_TABLET | Freq: Two times a day (BID) | ORAL | 2 refills | Status: DC

## 2019-02-01 NOTE — Telephone Encounter (Signed)
Called patient to clarify metoprolol dose. AVS instructions state "METOPROLOL 100MG  TWICE DAILY" Patient reports that that isn't how Dr Claiborne Billings told her to take the medication. His plan states "metoprolol tartrate 1 which is 50 mg which she is to take 1-1/2 pills twice a day"  Patient has been taking medication as recommended by Dr Claiborne Billings. Will refill medication for 1.5 tablets (75 mg total). Patient verbalized understanding. Rx(s) sent to pharmacy electronically.

## 2019-02-01 NOTE — Telephone Encounter (Signed)
Follow Up  Patient is following up about refill due to her being out of her medication. Patient is requesting a call back to confirm that refill has been called into the pharmacy.

## 2019-02-06 ENCOUNTER — Encounter: Payer: Self-pay | Admitting: Family Medicine

## 2019-02-06 ENCOUNTER — Ambulatory Visit (INDEPENDENT_AMBULATORY_CARE_PROVIDER_SITE_OTHER): Payer: Medicare Other | Admitting: Family Medicine

## 2019-02-06 ENCOUNTER — Other Ambulatory Visit: Payer: Self-pay

## 2019-02-06 VITALS — BP 132/78 | HR 68 | Temp 96.8°F | Ht 63.0 in | Wt 214.0 lb

## 2019-02-06 DIAGNOSIS — Z23 Encounter for immunization: Secondary | ICD-10-CM | POA: Diagnosis not present

## 2019-02-06 DIAGNOSIS — I10 Essential (primary) hypertension: Secondary | ICD-10-CM

## 2019-02-06 DIAGNOSIS — G4733 Obstructive sleep apnea (adult) (pediatric): Secondary | ICD-10-CM | POA: Diagnosis not present

## 2019-02-06 DIAGNOSIS — G43109 Migraine with aura, not intractable, without status migrainosus: Secondary | ICD-10-CM

## 2019-02-06 DIAGNOSIS — E7800 Pure hypercholesterolemia, unspecified: Secondary | ICD-10-CM | POA: Diagnosis not present

## 2019-02-06 DIAGNOSIS — E1100 Type 2 diabetes mellitus with hyperosmolarity without nonketotic hyperglycemic-hyperosmolar coma (NKHHC): Secondary | ICD-10-CM | POA: Diagnosis not present

## 2019-02-06 DIAGNOSIS — F331 Major depressive disorder, recurrent, moderate: Secondary | ICD-10-CM

## 2019-02-06 DIAGNOSIS — E669 Obesity, unspecified: Secondary | ICD-10-CM | POA: Insufficient documentation

## 2019-02-06 DIAGNOSIS — G40909 Epilepsy, unspecified, not intractable, without status epilepticus: Secondary | ICD-10-CM

## 2019-02-06 DIAGNOSIS — N3946 Mixed incontinence: Secondary | ICD-10-CM

## 2019-02-06 NOTE — Progress Notes (Addendum)
Patient: Abigail Figueroa, Female    DOB: October 20, 1960, 58 y.o.   MRN: [id number] Visit Date: 02/06/2019  Today's Provider: Vernie Murders, PA   Chief Complaint  Patient presents with  . Annual Exam   Subjective:  Abigail Figueroa is a 58 y.o. female who presents today for health maintenance and complete physical. She feels fairly well. She reports exercising daily w. She reports she is sleeping well.  Immunization History  Administered Date(s) Administered  . Influenza,inj,Quad PF,6+ Mos 02/28/2017, 01/17/2018  . Pneumococcal Polysaccharide-23 01/17/2018  . Tdap 10/20/2006    08/30/17 Colonoscopy 07/29/17 Mammogram 02/28/17 Lipids, A1C, TSH, CBC, Met C 09/20/17 Met B  Review of Systems  Constitutional: Negative.   HENT: Positive for congestion, sinus pressure and sore throat.   Eyes: Positive for itching.  Respiratory: Negative.   Cardiovascular: Positive for palpitations and leg swelling.  Gastrointestinal: Positive for diarrhea.  Endocrine: Negative.   Genitourinary: Positive for enuresis, frequency and urgency.  Musculoskeletal: Negative.   Skin: Negative.   Allergic/Immunologic: Positive for environmental allergies.  Neurological: Positive for dizziness, seizures and headaches.  Hematological: Bruises/bleeds easily.  Psychiatric/Behavioral: Positive for decreased concentration. The patient is nervous/anxious.    Social History   Socioeconomic History  . Marital status: Married    Spouse name: Not on file  . Number of children: 2  . Years of education: Not on file  . Highest education level: 12th grade  Occupational History  . Occupation: disabled  Social Needs  . Financial resource strain: Not hard at all  . Food insecurity    Worry: Never true    Inability: Never true  . Transportation needs    Medical: No    Non-medical: No  Tobacco Use  . Smoking status: Former Smoker    Types: Cigarettes    Quit date: 01/03/1983    Years since quitting: 36.1  .  Smokeless tobacco: Never Used  . Tobacco comment: quit in 1984  Substance and Sexual Activity  . Alcohol use: No    Alcohol/week: 0.0 standard drinks  . Drug use: No  . Sexual activity: Never  Lifestyle  . Physical activity    Days per week: 0 days    Minutes per session: 0 min  . Stress: Rather much  Relationships  . Social Herbalist on phone: Patient refused    Gets together: Patient refused    Attends religious service: Patient refused    Active member of club or organization: Patient refused    Attends meetings of clubs or organizations: Patient refused    Relationship status: Patient refused  . Intimate partner violence    Fear of current or ex partner: Patient refused    Emotionally abused: Patient refused    Physically abused: Patient refused    Forced sexual activity: Patient refused  Other Topics Concern  . Not on file  Social History Narrative   ** Merged History Encounter **       She is a married mother of 2, grandmother 3. She tries to get exercise but is not doing any routine program.   She quit smoking over 25 years ago does not drink alcohol.    Patient Active Problem List   Diagnosis Date Noted  . Special screening for malignant neoplasms, colon   . Rhinitis, allergic 06/18/2017  . Right-sided Bell's palsy 02/02/2016  . Spondylolisthesis of lumbar region 08/25/2015  . Type II diabetes mellitus ([place])   . Abnormal stress test   .  Hypercholesterolemia   . Abnormal nuclear stress test 07/30/2015  . Preoperative clearance 07/22/2015  . Lower extremity edema 07/22/2015  . History of brain disorder 11/05/2014  . H/O: obesity 11/05/2014  . Depression, major, recurrent, moderate ([place]) 11/05/2014  . Difficulty with family 11/05/2014  . Feeling stressed out 11/05/2014  . H/O: HTN (hypertension) 11/05/2014  . H/O elevated lipids 11/05/2014  . Anxiety, generalized 11/05/2014  . Migraine headache 11/05/2014  . Moderate recurrent major depression  ([place]) 10/16/2014  . Generalized anxiety disorder 10/16/2014  . CHF (congestive heart failure) ([place]) 10/14/2014  . Edema 10/14/2014  . Depressive disorder 10/14/2014  . Obstructive sleep apnea 10/14/2014  . Diabetes ([place]) 10/14/2014  . Essential hypertension 10/14/2014  . Epilepsy ([place]) 10/14/2014  . Migraine 10/14/2014  . Hyperlipemia 10/14/2014  . Dysthymic disorder 10/14/2014  . Seizure ([place]) 10/14/2014  . Hypercholesteremia 10/14/2014  . Palpitation 10/14/2014  . Depression 10/14/2014  . Dyspepsia 10/14/2014  . Hematochezia 10/14/2014  . Urge incontinence 10/14/2014  . Hair thinning 10/14/2014  . Dermatitis 09/10/2014  . Acid indigestion 09/10/2014  . Alopecia 09/10/2014  . Blood in feces 09/10/2014  . H/O hypercholesterolemia 09/10/2014  . Arthralgia of hip 09/10/2014  . Feeling bilious 09/10/2014  . Awareness of heartbeats 09/10/2014  . Pain in the wrist 09/10/2014  . Convulsions ([place]) 09/10/2014  . Urge incontinence 09/10/2014  . Snapping thumb syndrome 04/19/2014  . Diabetes mellitus ([place]) 03/14/2014  . De Quervain's disease (radial styloid tenosynovitis) 03/08/2014  . LBP (low back pain) 03/07/2013  . Mixed hyperlipidemia 01/15/2013  . Encounter for long-term (current) use of other medications - statin 01/15/2013  . Fatigue 01/15/2013  . Heart palpation 01/15/2013  . Obesity (BMI 30-39.9) 01/02/2013  . CCF (congestive cardiac failure) ([place]) 11/13/2009  . Malaise and fatigue 02/17/2009  . Sprain and strain of interphalangeal (joint) of hand 10/23/2007  . HEADACHE 06/23/2007  . Diabetes mellitus type 2 in obese ([place]) 05/08/2007  . HYPERCALCEMIA 05/08/2007  . Sleep apnea 05/08/2007  . Edema 05/08/2007  . DIARRHEA, CHRONIC 05/08/2007  . Clinical depression 10/20/2006  . Obstructive apnea 03/22/2006  . Diabetes mellitus, type 2 ([place]) 11/12/2005  . Depression, neurotic 11/12/2005  . Epilepsy ([place]) 11/12/2005  . Combined fat and carbohydrate induced hyperlipemia  11/12/2005  . Acute onset aura migraine 11/12/2005   Past Surgical History:  Procedure Laterality Date  . ABDOMINAL HYSTERECTOMY  1990   without BSO  . BACK SURGERY  1900s 2001   x2 with "cage put in"  . BILATERAL SALPINGOOPHORECTOMY  1990  . CARDIAC CATHETERIZATION N/A 08/06/2015   Procedure: Left Heart Cath and Coronary Angiography;  Surgeon: Troy Sine, MD;  Location: [facility];  Service: Cardiovascular;  Laterality: N/A;  . CARPAL TUNNEL RELEASE Bilateral   . CHOLECYSTECTOMY  1980  . COLONOSCOPY WITH PROPOFOL N/A 08/30/2017   Procedure: COLONOSCOPY WITH PROPOFOL;  Surgeon: Lin Landsman, MD;  Location: [facility];  Service: Endoscopy;  Laterality: N/A;  . DG THUMB LEFT HAND  01/24/2018   repair joint in left thumb  . DORSAL COMPARTMENT RELEASE Left 10/25/2014   Procedure: LEFT FIRST  DORSAL COMPARTMENT RELEASE AND RADIAL TENOSYNOVECTOMY ;  Surgeon: Roseanne Kaufman, MD;  Location: [facility];  Service: Orthopedics;  Laterality: Left;  . HAND SURGERY    . KNEE SURGERY Right 2012   meniscus tear  . KNEE SURGERY  2012  . LAMINECTOMY  1995  . TUBAL LIGATION  1982  . WRIST SURGERY Right  Her family history includes Alzheimer's disease in her maternal grandmother; Dementia in her maternal grandmother; Diabetes in her maternal grandfather; Heart attack in her mother; Hypertension in her maternal grandfather; Sarcoidosis in her mother; Seizures in her mother.     Outpatient Encounter Medications as of 02/06/2019  Medication Sig  . amLODipine (NORVASC) 5 MG tablet Take 1.5 tablets (7.5 mg total) by mouth daily.  Marland Kitchen atorvastatin (LIPITOR) 40 MG tablet TAKE 1 TABLET BY MOUTH EVERY DAY  . baclofen (LIORESAL) 10 MG tablet Take 10 mg by mouth 3 (three) times daily as needed.  Marland Kitchen BOTOX 100 UNITS SOLR injection Inject into the muscle every 3 (three) months.   . chlorproMAZINE (THORAZINE) 25 MG tablet Take 25 mg by mouth 3 (three) times daily as needed.   .  clindamycin (CLEOCIN T) 1 % lotion APPLY TO AFFECTED AREA OF THE FACE AS NEEDED FOR ACNE  . CYCLOBENZAPRINE HCL PO Take 1 tablet by mouth daily as needed.  Eduard Roux (AIMOVIG) 61 MG/ML SOAJ INJECT 62 MG BY SUBCUTANEOUS ROUTE ONCE MONTHLY IN HE ABDOMEN, THIGH, OR OUTER AREA OF UPPER ARM  . fluocinonide ointment (LIDEX) 6.43 % APPLY 1 APPLICATION TOPICALLY 2 (TWO) TIMES DAILY.  . fluticasone (FLONASE) 50 MCG/ACT nasal spray PLACE 2 SPRAYS INTO BOTH NOSTRILS DAILY. (Patient taking differently: PLACE 2 SPRAYS INTO BOTH NOSTRILS DAILY AS NEEDED.)  . furosemide (LASIX) 20 MG tablet TAKE 1 TABLET (20 MG TOTAL) BY MOUTH DAILY  . lamoTRIgine (LAMICTAL) 100 MG tablet Take 100 mg by mouth 2 (two) times daily.  . Lancets (ONETOUCH ULTRASOFT) lancets Test fasting each morning and 2 hours before supper. Retest if having hypoglycemic symptoms.  Marland Kitchen levocetirizine (XYZAL) 5 MG tablet TAKE 1 TABLET BY MOUTH EVERY DAY IN THE EVENING  . loperamide (IMODIUM A-D) 2 MG tablet Take 1 tablet (2 mg total) by mouth 4 (four) times daily as needed for diarrhea or loose stools.  . metFORMIN (GLUCOPHAGE) 500 MG tablet TAKE 2 TABLETS BY MOUTH IN THE MORNING ,1 TABLET AT LUNCH, & TAKE 2 TABLETS IN THE EVENING  . metoprolol tartrate (LOPRESSOR) 50 MG tablet Take 1.5 tablets (75 mg total) by mouth 2 (two) times daily.  Glory Rosebush VERIO test strip TEST FASTING GLUCOSE DAILY AS DIRECTED  . oxyCODONE (OXY IR/ROXICODONE) 5 MG immediate release tablet Take 5 mg by mouth as needed.   . promethazine (PHENERGAN) 25 MG tablet promethazine 25 mg tablet  TAKE 1 TABLET BY MOUTH AS NEEDED FOR NAUSEA  . TEGRETOL-XR 400 MG 12 hr tablet TAKE 1 TABLET BY MOUTH TWICE A DAY  . VIIBRYD 40 MG TABS TAKE 1 TABLET BY MOUTH EVERY DAY   No facility-administered encounter medications on file as of 02/06/2019.     Patient Care Team: Chrismon, Vickki Muff, PA as PCP - General (Physician Assistant) Troy Sine, MD as PCP - Cardiology  (Cardiology) Orie Rout, MD as Referring Physician (Specialist) Newman Pies, MD as Consulting Physician (Neurosurgery) Dingeldein, Remo Lipps, MD as Consulting Physician (Ophthalmology) Bernell List, CPhT as [place] [facility] (Pharmacy Technician)     Objective:   Vitals:  Vitals:   02/06/19 1129  BP: 132/78  Pulse: 68  Temp: (!) 96.8 F (36 C)  TempSrc: Skin  SpO2: 98%  Weight: 214 lb (97.1 kg)  Height: 5' 3"  (1.6 m)  Body mass index is 37.91 kg/m.  Wt Readings from Last 3 Encounters:  02/06/19 214 lb (97.1 kg)  09/21/18 212 lb 6.4 oz (96.3 kg)  03/20/18 213 lb 12.8 oz (97 kg)   Physical Exam Constitutional:      Appearance: She is well-developed. She is obese.  HENT:     Head: Normocephalic and atraumatic.     Right Ear: Tympanic membrane and external ear normal.     Left Ear: Tympanic membrane and external ear normal.     Nose: Nose normal.     Mouth/Throat:     Pharynx: Oropharynx is clear.  Eyes:     General:        Right eye: No discharge.     Conjunctiva/sclera: Conjunctivae normal.     Pupils: Pupils are equal, round, and reactive to light.  Neck:     Musculoskeletal: Normal range of motion and neck supple.     Thyroid: No thyromegaly.     Vascular: No carotid bruit.     Trachea: No tracheal deviation.  Cardiovascular:     Rate and Rhythm: Normal rate and regular rhythm.     Pulses: Normal pulses.     Heart sounds: Normal heart sounds. No murmur.  Pulmonary:     Effort: Pulmonary effort is normal. No respiratory distress.     Breath sounds: Normal breath sounds. No wheezing or rales.  Chest:     Chest wall: No tenderness.  Abdominal:     General: Bowel sounds are normal. There is no distension.     Palpations: Abdomen is soft. There is no mass.     Tenderness: There is no abdominal tenderness. There is no guarding or rebound.  Genitourinary:    Comments: Deferred exam. History of abdominal hysterectomy and BSO  due to DUB in 1990. Musculoskeletal: Normal range of motion.        General: No tenderness.  Lymphadenopathy:     Cervical: No cervical adenopathy.  Skin:    General: Skin is warm and dry.     Findings: No erythema or rash.  Neurological:     Mental Status: She is alert and oriented to person, place, and time.     Cranial Nerves: No cranial nerve deficit.     Motor: No abnormal muscle tone.     Coordination: Coordination normal.     Deep Tendon Reflexes: Reflexes are normal and symmetric. Reflexes normal.  Psychiatric:        Behavior: Behavior normal.        Thought Content: Thought content normal.        Judgment: Judgment normal.     Comments: Intermittent anxiety and sad mood.    Diabetic Foot Form - Detailed   Diabetic Foot Exam - detailed Diabetic Foot exam was performed with the following findings: Yes 02/06/2019  5:24 PM  Visual Foot Exam completed.: Yes  Can the patient see the bottom of their feet?: Yes Are the shoes appropriate in style and fit?: Yes Is there swelling or and abnormal foot shape?: No Is there a claw toe deformity?: No Is there elevated skin temparature?: No Is there foot or ankle muscle weakness?: No Normal Range of Motion: Yes Pulse Foot Exam completed.: Yes  Right posterior Tibialias: Present Left posterior Tibialias: Present  Right Dorsalis Pedis: Present Left Dorsalis Pedis: Present  Sensory Foot Exam Completed.: Yes Semmes-Weinstein Monofilament Test R Site 1-Great Toe: Pos L Site 1-Great Toe: Pos        Depression Screen PHQ 2/9 Scores 12/12/2018 07/15/2017 02/28/2017 07/20/2016  PHQ - 2 Score 5 3 2 2   PHQ- 9 Score 11 7 4  7  Assessment & Plan:     Routine Health Maintenance and Physical Exam  Exercise Activities and Dietary recommendations Goals    . DIET - DECREASE SODA OR JUICE INTAKE     Recommend to continue current plan to cut out all sodas in diet to help aid in weight loss.     . Increase water intake     Recommend to  continue drinking 40 oz of water a day.       Immunization History  Administered Date(s) Administered  . Influenza,inj,Quad PF,6+ Mos 02/28/2017, 01/17/2018  . Pneumococcal Polysaccharide-23 01/17/2018  . Tdap 10/20/2006    Health Maintenance  Topic Date Due  . PAP SMEAR-Modifier  10/13/1981  . TETANUS/TDAP  10/19/2016  . OPHTHALMOLOGY EXAM  02/11/2017  . URINE MICROALBUMIN  07/20/2017  . HEMOGLOBIN A1C  08/29/2017  . FOOT EXAM  02/28/2018  . INFLUENZA VACCINE  12/02/2018  . MAMMOGRAM  07/30/2019  . COLONOSCOPY  08/31/2022  . PNEUMOCOCCAL POLYSACCHARIDE VACCINE AGE 3-64 HIGH RISK  Completed  . Hepatitis C Screening  Completed  . HIV Screening  Completed     Discussed health benefits of physical activity, and encouraged her to engage in regular exercise appropriate for her age and condition.  1. Type 2 diabetes mellitus with hyperosmolarity without coma, without long-term current use of insulin (HCC) FBS in the 130's range on average. No polydipsia but some urinary mixed incontinence and has to wear adult diapers day and night. Tolerating the Metformin 500 mg 2 tablets morning and evening with 1 tablet at lunch. Recheck labs and encouraged to proceed with ophthalmology annual exam. Normal sensation to test feet with nylon string. Arthritis of the right foot followed by [facility] (podiatrist). Follow up pending lab reports. - CBC with Differential/Platelet - Comprehensive metabolic panel - Hemoglobin A1c - Lipid panel - Microalbumin, urine  2. Obstructive sleep apnea Followed by Dr. Claiborne Billings (cardiologist) and using CPAP at 11 cm H2O pressure each night. Feels she sleeps better with it.  - CBC with Differential/Platelet - TSH  3. Need for influenza vaccination - Flu Vaccine QUAD 6+ mos PF IM (Fluarix Quad PF)  4. Essential hypertension Well controlled BP. No palpitations, chest pain, dyspnea or peripheral edema of significance. Tolerating the Amlodipine 5 mg qd with  Metoprolol Tartrate 75 mg BID without dizziness or fatigue issues. Recheck routine labs and continue follow up with cardiologist (Dr. Claiborne Billings). - CBC with Differential/Platelet - Comprehensive metabolic panel - Lipid panel - TSH  5. Hypercholesterolemia Tolerating the Atorvastatin 40 mg qd and trying to follow a low fat diet. Encouraged to exercise 3-4 days a week for 30-40 minutes. Recheck CMP, Lipid Panel and TSH. - Comprehensive metabolic panel - Lipid panel - TSH  6. Depression, major, recurrent, moderate ([place]) Feels the Viibryd helping with sadness and able to concentrate/focus to complete tasks. No suicidal ideation. Recheck routine labs.  - CBC with Differential/Platelet - Comprehensive metabolic panel - TSH  7. Migraine with aura and without status migrainosus, not intractable Dr. Domingo Cocking (neurologist) following for chronic migraines. Treated with Aimovig injection once a month and Botox injection q 3 months.. Recheck routine labs. Continue follow up with him as planned. Continues to take Tegretol-XR 400 mg BID for seizure control, also. - CBC with Differential/Platelet - Comprehensive metabolic panel  8. Mixed stress and urge urinary incontinence Frequently having enuresis and sometimes BM soiling at night. Using Depends diapers twice a day to maintain dryness and prevent skin breakdown. Given written prescription to  refill adult diapers. Recheck CBC and CMP to assess for infection and renal function. - CBC with Differential/Platelet - Comprehensive metabolic panel   9. Morbid obesity due to excess calories Feeling well and BMI 37.9. Has only gained 1 lb in the past year. Continues to try to follow a low fat diabetic diet. Encouraged to work on some exercise 3-4 days a week.  10. Nonintractable epilepsy without status epilepticus No seizures in more than 10 years. Still taking Tegretol-XR for control. Recheck prn.

## 2019-02-13 DIAGNOSIS — M542 Cervicalgia: Secondary | ICD-10-CM | POA: Diagnosis not present

## 2019-02-13 DIAGNOSIS — M791 Myalgia, unspecified site: Secondary | ICD-10-CM | POA: Diagnosis not present

## 2019-02-13 DIAGNOSIS — G43719 Chronic migraine without aura, intractable, without status migrainosus: Secondary | ICD-10-CM | POA: Diagnosis not present

## 2019-02-24 ENCOUNTER — Other Ambulatory Visit: Payer: Self-pay | Admitting: Family Medicine

## 2019-02-24 DIAGNOSIS — F331 Major depressive disorder, recurrent, moderate: Secondary | ICD-10-CM

## 2019-02-27 ENCOUNTER — Other Ambulatory Visit
Admission: RE | Admit: 2019-02-27 | Discharge: 2019-02-27 | Disposition: A | Discharge status: Home / Self Care | Payer: Medicare Other | Attending: Family Medicine | Admitting: Family Medicine

## 2019-02-27 ENCOUNTER — Other Ambulatory Visit: Payer: Self-pay

## 2019-02-27 DIAGNOSIS — N3946 Mixed incontinence: Secondary | ICD-10-CM | POA: Insufficient documentation

## 2019-02-27 DIAGNOSIS — I10 Essential (primary) hypertension: Secondary | ICD-10-CM | POA: Diagnosis not present

## 2019-02-27 DIAGNOSIS — F331 Major depressive disorder, recurrent, moderate: Secondary | ICD-10-CM | POA: Insufficient documentation

## 2019-02-27 DIAGNOSIS — E1100 Type 2 diabetes mellitus with hyperosmolarity without nonketotic hyperglycemic-hyperosmolar coma (NKHHC): Secondary | ICD-10-CM | POA: Diagnosis not present

## 2019-02-27 DIAGNOSIS — G4733 Obstructive sleep apnea (adult) (pediatric): Secondary | ICD-10-CM | POA: Diagnosis not present

## 2019-02-27 DIAGNOSIS — G43109 Migraine with aura, not intractable, without status migrainosus: Secondary | ICD-10-CM | POA: Diagnosis not present

## 2019-02-27 DIAGNOSIS — E7800 Pure hypercholesterolemia, unspecified: Secondary | ICD-10-CM | POA: Diagnosis not present

## 2019-02-27 LAB — CBC WITH DIFFERENTIAL/PLATELET
Abs Immature Granulocytes: 0.02 10*3/uL (ref 0.00–0.07)
Basophils Absolute: 0 10*3/uL (ref 0.0–0.1)
Basophils Relative: 1 %
Eosinophils Absolute: 0.2 10*3/uL (ref 0.0–0.5)
Eosinophils Relative: 4 %
HCT: 34.2 % — ABNORMAL LOW (ref 36.0–46.0)
Hemoglobin: 10.9 g/dL — ABNORMAL LOW (ref 12.0–15.0)
Immature Granulocytes: 0 %
Lymphocytes Relative: 39 %
Lymphs Abs: 2.1 10*3/uL (ref 0.7–4.0)
MCH: 28.2 pg (ref 26.0–34.0)
MCHC: 31.9 g/dL (ref 30.0–36.0)
MCV: 88.4 fL (ref 80.0–100.0)
Monocytes Absolute: 0.3 10*3/uL (ref 0.1–1.0)
Monocytes Relative: 5 %
Neutro Abs: 2.7 10*3/uL (ref 1.7–7.7)
Neutrophils Relative %: 51 %
Platelets: 201 10*3/uL (ref 150–400)
RBC: 3.87 MIL/uL (ref 3.87–5.11)
RDW: 14.4 % (ref 11.5–15.5)
WBC: 5.3 10*3/uL (ref 4.0–10.5)
nRBC: 0 % (ref 0.0–0.2)

## 2019-02-27 LAB — HEMOGLOBIN A1C
Hgb A1c MFr Bld: 7 % — ABNORMAL HIGH (ref 4.8–5.6)
Mean Plasma Glucose: 154.2 mg/dL

## 2019-02-27 LAB — COMPREHENSIVE METABOLIC PANEL
ALT: 24 U/L (ref 0–44)
AST: 22 U/L (ref 15–41)
Albumin: 3.8 g/dL (ref 3.5–5.0)
Alkaline Phosphatase: 65 U/L (ref 38–126)
Anion gap: 10 (ref 5–15)
BUN: 21 mg/dL — ABNORMAL HIGH (ref 6–20)
CO2: 24 mmol/L (ref 22–32)
Calcium: 10.3 mg/dL (ref 8.9–10.3)
Chloride: 107 mmol/L (ref 98–111)
Creatinine, Ser: 1.11 mg/dL — ABNORMAL HIGH (ref 0.44–1.00)
GFR calc Af Amer: >60 mL/min (ref >60)
GFR calc non Af Amer: 55 mL/min — ABNORMAL LOW (ref >60)
Glucose, Bld: 137 mg/dL — ABNORMAL HIGH (ref 70–99)
Potassium: 4.6 mmol/L (ref 3.5–5.1)
Sodium: 141 mmol/L (ref 135–145)
Total Bilirubin: 0.4 mg/dL (ref 0.3–1.2)
Total Protein: 7.5 g/dL (ref 6.5–8.1)

## 2019-02-27 LAB — LIPID PANEL
Cholesterol: 198 mg/dL (ref 0–200)
HDL: 71 mg/dL (ref >40)
LDL Cholesterol: 105 mg/dL — ABNORMAL HIGH (ref 0–99)
Total CHOL/HDL Ratio: 2.8 RATIO
Triglycerides: 112 mg/dL (ref <150)
VLDL: 22 mg/dL (ref 0–40)

## 2019-02-27 LAB — TSH: TSH: 2.731 u[IU]/mL (ref 0.350–4.500)

## 2019-02-28 ENCOUNTER — Telehealth: Payer: Self-pay

## 2019-02-28 LAB — MICROALBUMIN, URINE: Microalb, Ur: 37.6 ug/mL — ABNORMAL HIGH

## 2019-02-28 NOTE — Telephone Encounter (Signed)
-----   Message from Margo Common, [place] sent at 02/27/2019  3:52 PM EDT ----- Blood sugar improving and Hgb A1C at goal of 7.0. Kidney function shows some stress and need to drink extra water in diet. Cholesterol and triglycerides in good shape. Hgb and Hct a little low. Need to continue diabetes medications and Atorvastatin as prescribed. Add a multivitamin with iron daily and recheck levels in 4-6 months.

## 2019-02-28 NOTE — Telephone Encounter (Signed)
LMTCB

## 2019-03-01 ENCOUNTER — Telehealth (INDEPENDENT_AMBULATORY_CARE_PROVIDER_SITE_OTHER): Payer: Medicare Other | Admitting: Cardiovascular Disease

## 2019-03-01 VITALS — BP 140/80 | HR 72 | Ht 63.0 in | Wt 211.6 lb

## 2019-03-01 DIAGNOSIS — Z6837 Body mass index (BMI) 37.0-37.9, adult: Secondary | ICD-10-CM

## 2019-03-01 DIAGNOSIS — Z8639 Personal history of other endocrine, nutritional and metabolic disease: Secondary | ICD-10-CM | POA: Diagnosis not present

## 2019-03-01 DIAGNOSIS — Z79899 Other long term (current) drug therapy: Secondary | ICD-10-CM

## 2019-03-01 DIAGNOSIS — I10 Essential (primary) hypertension: Secondary | ICD-10-CM

## 2019-03-01 DIAGNOSIS — E669 Obesity, unspecified: Secondary | ICD-10-CM

## 2019-03-01 DIAGNOSIS — E1169 Type 2 diabetes mellitus with other specified complication: Secondary | ICD-10-CM

## 2019-03-01 DIAGNOSIS — G4733 Obstructive sleep apnea (adult) (pediatric): Secondary | ICD-10-CM | POA: Diagnosis not present

## 2019-03-01 MED ORDER — METOPROLOL TARTRATE 100 MG PO TABS: 100.0000 mg | ORAL_TABLET | Freq: Two times a day (BID) | ORAL | 6 refills | Status: DC

## 2019-03-01 MED ORDER — EZETIMIBE 10 MG PO TABS: 10.0000 mg | ORAL_TABLET | Freq: Every day | ORAL | 3 refills | Status: DC

## 2019-03-01 MED ORDER — FUROSEMIDE 20 MG PO TABS: 20.0000 mg | ORAL_TABLET | Freq: Every day | ORAL | 6 refills | Status: DC

## 2019-03-01 NOTE — Patient Instructions (Addendum)
Medication Instructions:  INCREASE METOPROLOL 100MG  TWICE DAILY MAY TAKE EXTRA LASIX 20MG  FOR ADDITIONAL SWELLING START ezetimibe (ZETIA) 10 MG DAILY  If you need a refill on your cardiac medications before your next appointment, please call your pharmacy.  Labwork: FASTIN LIPID AND CMET IN 3 MONTHS-END OF JANUARY HERE IN OUR OFFICE AT [facility]    You will need to fast. DO NOT EAT OR DRINK PAST MIDNIGHT.      If you have labs (blood work) drawn today and your tests are completely normal, you will receive your results only by: Marland Kitchen MyChart Message (if you have MyChart) OR . A paper copy in the mail If you have any lab test that is abnormal or we need to change your treatment, we will call you to review the results.  Follow-Up: IN 6 months Please call our office 2 months in advance, END OF JAN 2021 to schedule this END OF APR 2021 appointment. In Person You may see Shelva Majestic, MD or one of the following Advanced Practice Providers on your designated Care Team:    Almyra Deforest, PA-C  Fabian Sharp, PA-C or  [place], [place].    At [facility], you and your health needs are our priority.  As part of our continuing mission to provide you with exceptional heart care, we have created designated Provider Care Teams.  These Care Teams include your primary Cardiologist (physician) and Advanced Practice Providers (APPs -  Physician Assistants and Nurse Practitioners) who all work together to provide you with the care you need, when you need it.  Thank you for choosing [facility] at [facility]!!

## 2019-03-01 NOTE — Progress Notes (Signed)
Virtual Visit via Telephone Note   This visit type was conducted due to national recommendations for restrictions regarding the COVID-19 Pandemic (e.g. social distancing) in an effort to limit this patient's exposure and mitigate transmission in our community.  Due to her co-morbid illnesses, this patient is at least at moderate risk for complications without adequate follow up.  This format is felt to be most appropriate for this patient at this time.  The patient did not have access to video technology/had technical difficulties with video requiring transitioning to audio format only (telephone).  All issues noted in this document were discussed and addressed.  No physical exam could be performed with this format.  Please refer to the patient's chart for her  consent to telehealth for [facility].   Date:  03/01/2019   ID:  Abigail Figueroa, DOB 02-Aug-1960, MRN [id number]  Patient Location: Home Provider Location: Office  PCP:  Margo Common, PA  Cardiologist:  Shelva Majestic, MD  Electrophysiologist:  None   Evaluation Performed:  Follow-Up Visit  Chief Complaint:  4 month F/U  History of Present Illness:    Abigail Figueroa is a 58 y.o. female  who has a history of hypertension, palpitations, peripheral edema, hyperlipidemia, type 2 diabetes mellitus, depression, as well as migraines. In 2007, she underwent a sleep study which was interpreted by Dr. Chancy Milroy. I do not have the diagnostic polysomnogram report but I do have the CPAP titration trial. She ultimately was titrated up to an 8 cm water pressure. She states she initially used CPAP therapy for several years but had not used in years. When I saw her in October 2014 her sleep had been very poor for several years and she had with frequent awakenings and loud snoring.  At that time, Her Epworth sleepiness scale revealed severe excessive daytime sleepiness.  Epworth Sleepiness Scale: Situation  Chance of Dozing/Sleeping  (0 = never , 1 = slight chance , 2 = moderate chance , 3 = high chance )  sitting and reading 3  watching TV 3  sitting inactive in a public place 3  being a passenger in a motor vehicle for an hour or more 3  lying down in the afternoon 3  sitting and talking to someone 3  sitting quietly after lunch (no alcohol) 3  while stopped for a few minutes in traffic as the driver 0  Total Score  21   She received a S9CPAPunitbut ultimately again stopped using treatment.   In April 2017, she underwent preoperative clearance nuclear study prior to undergoing lumbar back surgery. This study was interpreted as intermediate risk with a possible defect in the mid, distal anterior wall and apex and there was distal inferior hypokinesis. She ultimately was referred for cardiac catheterization which I performed on 08/06/2015 and revealed normal coronary arteries.  She was admitted to [facility] in September 2017 with Bell's palsy. She was treated with steroids.  When Isaw her in January 2018 I  discussed the importance of resuming CPAP therapy and since it had been over 11 years since her initial evaluation I recommend repeat assessment. Sleep study was done on November 08, 2017 which again confirmed severe sleep apnea with an AHI of 30.5/h and RDI of 42.5/h. Events were more severe during REM sleep with an AHI of 76.1/h. She had significant oxygen desaturation to a nadir of 71%. CPAP was initiated and was titrated up to 11 cm. She received a new ResMed AirSense  10 AutoSet CPAP unit with set up date on December 09, 2017. She had worn the CPAP initially but but then developed a dermatitis/rash on her face for which she underwent dermatologic evaluation by Dr. Baxter Kail was felt to have contact dermatitis. The patient felt that the mask was potentially contributing to this. As result, recently she was rarely using CPAP. A download was obtained in the office  from February 14, 2018 through March 15, 2018 confirmed poor compliance with usage at only 10% over the last 30 days with 3 hours and 58 minutes of average use. AHI however was excellent at 0.8 on 11 cm water pressure.  When I last saw her in March 20, 2018 I stressed the importance of meeting compliance standards with CPAP use.  I discussed the negative cardiovascular consequences of untreated sleep apnea with reference to her health.  I recommend changing her mask to a DreamWear with chinstrap to provide minimal contact to her face to reduce potential contact dermatitis. She was on atorvastatin for hyperlipidemia and had not had recent laboratory.  She also was on amlodipine 5 mg, metoprolol 75 mg twice a day for hypertension and is diabetic on metformin.   On Sep 21, 2018 I evaluated her in a telemedicine visit.  At that time her blood pressure was elevated she had confusion over her medications for blood pressure.  From a sleep apnea perspective she was doing much better with treatment and a download from April 21 through Sep 20, 2018 revealed compliance with average usage at 5 hours and 40 minutes.  AHI at 11 cm pressure was 2.4 without significant leak.  She was on atorvastatin for hyperlipidemia.  We discussed weight loss with morbid obesity and she was on metformin for diabetes mellitus.  She presents for follow-up evaluation.  The patient does not have symptoms concerning for COVID-19 infection (fever, chills, cough, or new shortness of breath).    Past Medical History:  Diagnosis Date   Abnormal stress test    a. 10/2009: Normal myocardial perfusion imaging; b. 08/2015 MV: medium defect of mod severity in mid ant apical region w/ mild HK of the distal inf wall;  c. 08/2015 Cath: nl Cors, EF 60%.   Anemia    Anxiety    Arthritis    Bell's palsy    Depression    Edema    Essential hypertension    Frequent urination    Frequent urination at night    Headache    migraines, gets botox  injections   Heart palpitations June 2013   Event monitor showing sinus tachycardia, PACs with couplets and triplets.   Hx of echocardiogram    a. 10/2009 Echo: showed normal left ventricular function, mild left ventricular hypertrophy, and no significant valve abnormalities.   Hypercholesterolemia    Migraine    Morbid obesity ([place])    Neuromuscular disorder (HCC)    OSA (obstructive sleep apnea)    has been on continuous positive airway pressure   Seizure disorder ([place])    Seizures ([place])    Type II diabetes mellitus ([place])    Past Surgical History:  Procedure Laterality Date   ABDOMINAL HYSTERECTOMY  1990   without BSO   BACK SURGERY  1900s 2001   x2 with "cage put in"   [place] N/A 08/06/2015   Procedure: Left Heart Cath and Coronary Angiography;  Surgeon: Troy Sine, MD;  Location: [facility];  Service:  Cardiovascular;  Laterality: N/A;   CARPAL TUNNEL RELEASE Bilateral    CHOLECYSTECTOMY  1980   COLONOSCOPY WITH PROPOFOL N/A 08/30/2017   Procedure: COLONOSCOPY WITH PROPOFOL;  Surgeon: Lin Landsman, MD;  Location: [facility];  Service: Endoscopy;  Laterality: N/A;   DG THUMB LEFT HAND  01/24/2018   repair joint in left thumb   DORSAL COMPARTMENT RELEASE Left 10/25/2014   Procedure: LEFT FIRST  DORSAL COMPARTMENT RELEASE AND RADIAL TENOSYNOVECTOMY ;  Surgeon: Roseanne Kaufman, MD;  Location: [facility];  Service: Orthopedics;  Laterality: Left;   HAND SURGERY     KNEE SURGERY Right 2012   meniscus tear   KNEE SURGERY  2012   LAMINECTOMY  1995   TUBAL LIGATION  1982   WRIST SURGERY Right      Current Meds  Medication Sig   amLODipine (NORVASC) 5 MG tablet Take 1.5 tablets (7.5 mg total) by mouth daily.   atorvastatin (LIPITOR) 40 MG tablet TAKE 1 TABLET BY MOUTH EVERY DAY   baclofen (LIORESAL) 10 MG tablet Take 10 mg by mouth 3 (three) times daily as needed.     BOTOX 100 UNITS SOLR injection Inject into the muscle every 3 (three) months.    chlorproMAZINE (THORAZINE) 25 MG tablet Take 25 mg by mouth 3 (three) times daily as needed.    clindamycin (CLEOCIN T) 1 % lotion APPLY TO AFFECTED AREA OF THE FACE AS NEEDED FOR ACNE   CYCLOBENZAPRINE HCL PO Take 1 tablet by mouth daily as needed.   Erenumab-aooe (AIMOVIG) 70 MG/ML SOAJ INJECT 70 MG BY SUBCUTANEOUS ROUTE ONCE MONTHLY IN HE ABDOMEN, THIGH, OR OUTER AREA OF UPPER ARM   fluocinonide ointment (LIDEX) 3.14 % APPLY 1 APPLICATION TOPICALLY 2 (TWO) TIMES DAILY.   fluticasone (FLONASE) 50 MCG/ACT nasal spray PLACE 2 SPRAYS INTO BOTH NOSTRILS DAILY. (Patient taking differently: PLACE 2 SPRAYS INTO BOTH NOSTRILS DAILY AS NEEDED.)   furosemide (LASIX) 20 MG tablet TAKE 1 TABLET (20 MG TOTAL) BY MOUTH DAILY   lamoTRIgine (LAMICTAL) 100 MG tablet Take 100 mg by mouth 2 (two) times daily.   levocetirizine (XYZAL) 5 MG tablet TAKE 1 TABLET BY MOUTH EVERY DAY IN THE EVENING   loperamide (IMODIUM A-D) 2 MG tablet Take 1 tablet (2 mg total) by mouth 4 (four) times daily as needed for diarrhea or loose stools.   metFORMIN (GLUCOPHAGE) 500 MG tablet TAKE 2 TABLETS BY MOUTH IN THE MORNING ,1 TABLET AT LUNCH, & TAKE 2 TABLETS IN THE EVENING   metoprolol tartrate (LOPRESSOR) 50 MG tablet Take 1.5 tablets (75 mg total) by mouth 2 (two) times daily.   ONETOUCH VERIO test strip TEST FASTING GLUCOSE DAILY AS DIRECTED   oxyCODONE (OXY IR/ROXICODONE) 5 MG immediate release tablet Take 5 mg by mouth as needed.    promethazine (PHENERGAN) 25 MG tablet promethazine 25 mg tablet  TAKE 1 TABLET BY MOUTH AS NEEDED FOR NAUSEA   TEGRETOL-XR 400 MG 12 hr tablet TAKE 1 TABLET BY MOUTH TWICE A DAY   VIIBRYD 40 MG TABS TAKE 1 TABLET BY MOUTH EVERY DAY     Allergies:   Morphine, Morphine and related, Oxycodone, Oxycodone hcl, and Morphine sulfate   Social History   Tobacco Use   Smoking status: Former Smoker     Types: Cigarettes    Quit date: 01/03/1983    Years since quitting: 36.1   Smokeless tobacco: Never Used   Tobacco comment: quit in 1984  Substance Use Topics  Alcohol use: No    Alcohol/week: 0.0 standard drinks   Drug use: No     Family Hx: The patient's family history includes Alzheimer's disease in her maternal grandmother; Dementia in her maternal grandmother; Diabetes in her maternal grandfather; Heart attack in her mother; Hypertension in her maternal grandfather; Sarcoidosis in her mother; Seizures in her mother. There is no history of Breast cancer.  ROS:   Please see the history of present illness.    Negative fever chills or night sweats. No cough or sore throat Occasional palpitations No chest pain or shortness of breath Mild leg swelling Using CPAP No neurologic symptoms All other systems reviewed and are negative.   Prior CV studies:   The following studies were reviewed today:  A new download was obtained today from January 28, 2019 through 09/26/2018.  She continues to meet usage compliance with use of 25/30 days.  Average use duration however is only 4 hours and 40 minutes. At 11 cm of pressure, AHI is 1.6.  Labs/Other Tests and Data Reviewed:    EKG:  An ECG dated 03/23/2018 was personally reviewed today and demonstrated:  Normal sinus rhythm at 65, borderline criteria for LVH.  No ectopy  Recent Labs: 02/27/2019: ALT 24; BUN 21; Creatinine, Ser 1.11; Hemoglobin 10.9; Platelets 201; Potassium 4.6; Sodium 141; TSH 2.731   Recent Lipid Panel Lab Results  Component Value Date/Time   CHOL 198 02/27/2019 08:18 AM   CHOL 289 (H) 01/10/2015 10:09 AM   CHOL 277 (H) 08/16/2014 08:43 AM   TRIG 112 02/27/2019 08:18 AM   TRIG 167 (H) 08/16/2014 08:43 AM   HDL 71 02/27/2019 08:18 AM   HDL 91 01/10/2015 10:09 AM   HDL 77 08/16/2014 08:43 AM   CHOLHDL 2.8 02/27/2019 08:18 AM   LDLCALC 105 (H) 02/27/2019 08:18 AM   LDLCALC 190 (H) 02/28/2017 10:09 AM    LDLCALC 167 (H) 08/16/2014 08:43 AM    Wt Readings from Last 3 Encounters:  03/01/19 211 lb 9.6 oz (96 kg)  02/06/19 214 lb (97.1 kg)  09/21/18 212 lb 6.4 oz (96.3 kg)     Objective:    Vital Signs:  BP 140/80    Pulse 72    Ht 5\' 3"  (1.6 m)    Wt 211 lb 9.6 oz (96 kg)    LMP  (LMP Unknown)    BMI 37.48 kg/m    Since this was a virtual evaluation I could not physically examine her. Breathing was normal and not labored I did not hear any audible wheezing Her heart rhythm was regular without extra beats There was no chest tightness or discomfort to palpation There was no abdominal discomfort There was mild ankle swelling No new neurologic symptoms Normal affect and  ASSESSMENT & PLAN:    1. Essential hypertension: Blood pressure is mildly increased based on new hypertensive guidelines.  I am recommending slight titration of her metoprolol from 75 mg twice a day up to 100 mg twice a day.  This should also be helpful for her intermittent palpitations.  She will continue with amlodipine 5 mg and furosemide 20 mg as prescribed. 2. Mild leg swelling: This is not consistent.  However she can take an extra Lasix on a as needed basis in the late afternoon if edema develops. 3. OSA: I discussed her most recent download.  AHI is excellent at 1.6 at 11 cm water pressure.  However average usage is only 4 hours and 40 minutes.  We discussed  the need for increase in sleep duration and particularly to use CPAP through the entirety of the night particularly since sleep apnea is typically worse and REM sleep. 4. Hyperlipidemia: In October 2018, LDL cholesterol was 190.  She has been on atorvastatin and most recent lipid panel from February 27, 2019 showed an LDL cholesterol at 105.  I have recommended the addition of Zetia 10 mg to achieve a goal less than 70 in this diabetic female. 5. Type 2 diabetes mellitus: Most recent hemoglobin A1c 7.0. 6. Obesity: BMI 37.  We discussed the importance of weight  loss and increased exercise.  COVID-19 Education: The signs and symptoms of COVID-19 were discussed with the patient and how to seek care for testing (follow up with PCP or arrange E-visit). The importance of social distancing was discussed today.  Time:   Today, I have spent 20 minutes with the patient with telehealth technology discussing the above problems.     Medication Adjustments/Labs and Tests Ordered: Current medicines are reviewed at length with the patient today.  Concerns regarding medicines are outlined above.   Tests Ordered: No orders of the defined types were placed in this encounter.   Medication Changes: No orders of the defined types were placed in this encounter.   Follow Up: I have recommended follow-up laboratory in 3 months with a comprehensive metabolic panel and lipid panel.  81-month follow-up office evaluation.  Signed, Shelva Majestic, MD  03/01/2019 9:53 AM    [facility]

## 2019-03-02 ENCOUNTER — Telehealth: Payer: Self-pay

## 2019-03-02 ENCOUNTER — Other Ambulatory Visit: Payer: Self-pay | Admitting: Family Medicine

## 2019-03-02 DIAGNOSIS — L309 Dermatitis, unspecified: Secondary | ICD-10-CM

## 2019-03-02 DIAGNOSIS — J301 Allergic rhinitis due to pollen: Secondary | ICD-10-CM

## 2019-03-02 MED ORDER — LEVOCETIRIZINE DIHYDROCHLORIDE 5 MG PO TABS: ORAL_TABLET | ORAL | 1 refills | Status: DC

## 2019-03-02 MED ORDER — FLUOCINONIDE 0.05 % EX OINT: 1.0000 "application " | TOPICAL_OINTMENT | Freq: Two times a day (BID) | CUTANEOUS | 1 refills | Status: DC

## 2019-03-02 NOTE — Telephone Encounter (Signed)
Patient advised as directed below. 

## 2019-03-02 NOTE — Telephone Encounter (Signed)
Patient advised as directed below and scheduled patient for her 3 months f/u.  Patient reports that she picked her levocetirizine (XYZAL) 5 MG tablet  Medication 01/27/19 from the pharmacy and she cannot find it.that she has looked all over her house and is no where to find. She is asking if another prescription can be send for her please.   She is also requesting refills on fluocinonide ointment (LIDEX) 0.05 %   Pharmacy: [facility]

## 2019-03-02 NOTE — Telephone Encounter (Signed)
-----   Message from Margo Common, [place] sent at 03/01/2019  6:14 PM EDT ----- Urine microalbumin level not above 50. Recheck level annually to monitor for kidney problems from diabetes. Maintain best control of diabetes possible. Recheck diabetes control in 3 months.

## 2019-03-02 NOTE — Telephone Encounter (Signed)
Sent refill of Xyzal and Lidex to [facility] [address].

## 2019-03-20 ENCOUNTER — Other Ambulatory Visit: Payer: Self-pay

## 2019-03-20 ENCOUNTER — Ambulatory Visit (INDEPENDENT_AMBULATORY_CARE_PROVIDER_SITE_OTHER): Payer: Medicare Other | Admitting: Family Medicine

## 2019-03-20 ENCOUNTER — Encounter: Payer: Self-pay | Admitting: Family Medicine

## 2019-03-20 VITALS — BP 145/75 | HR 72 | Temp 97.8°F | Resp 16 | Wt 219.2 lb

## 2019-03-20 DIAGNOSIS — R220 Localized swelling, mass and lump, head: Secondary | ICD-10-CM | POA: Diagnosis not present

## 2019-03-20 DIAGNOSIS — G43709 Chronic migraine without aura, not intractable, without status migrainosus: Secondary | ICD-10-CM

## 2019-03-20 NOTE — Progress Notes (Signed)
Patient: Abigail Figueroa Female    DOB: [birth year]   58 y.o.   MRN: [id number] Visit Date: 03/20/2019  Today's Provider: Vernie Murders, PA     Subjective:    I,Joseline E. Rosas,RMA am acting as a Education administrator for [employer], PA-C.  HPI  Patient with c/o lip swelling and ear pain.Reports that on Sunday morning the left side of her face was swollen specially her lip and her tongue. Reports that Sunday afternoon through today her right ear side started to hurt going down to neck. She checked her BP on Sunday and it was 150/70's.She reports that her head is hurting on and off. She reports that she made rice krispy with pumpkin spice, and the pumpkin spice is a new ingredient.  She took the AIMOVIG injection last Thursday.She is not sure if she may be having a side effect from this or if she had an allergic reaction from the pumpkin spice or if something else is going on. Patient has a history of Bell Palsy.  Past Medical History:  Diagnosis Date  . Abnormal stress test    a. 10/2009: Normal myocardial perfusion imaging; b. 08/2015 MV: medium defect of mod severity in mid ant apical region w/ mild HK of the distal inf wall;  c. 08/2015 Cath: nl Cors, EF 60%.  . Anemia   . Anxiety   . Arthritis   . Bell's palsy   . Depression   . Edema   . Essential hypertension   . Frequent urination   . Frequent urination at night   . Headache    migraines, gets botox injections  . Heart palpitations June 2013   Event monitor showing sinus tachycardia, PACs with couplets and triplets.  Marland Kitchen Hx of echocardiogram    a. 10/2009 Echo: showed normal left ventricular function, mild left ventricular hypertrophy, and no significant valve abnormalities.  . Hypercholesterolemia   . Migraine   . Morbid obesity ([place])   . Neuromuscular disorder ([place])   . OSA (obstructive sleep apnea)    has been on continuous positive airway pressure  . Seizure disorder ([place])   . Seizures ([place])   . Type II diabetes  mellitus ([place])    Past Surgical History:  Procedure Laterality Date  . ABDOMINAL HYSTERECTOMY  1990   without BSO  . BACK SURGERY  1900s 2001   x2 with "cage put in"  . BILATERAL SALPINGOOPHORECTOMY  1990  . CARDIAC CATHETERIZATION N/A 08/06/2015   Procedure: Left Heart Cath and Coronary Angiography;  Surgeon: Troy Sine, MD;  Location: [facility];  Service: Cardiovascular;  Laterality: N/A;  . CARPAL TUNNEL RELEASE Bilateral   . CHOLECYSTECTOMY  1980  . COLONOSCOPY WITH PROPOFOL N/A 08/30/2017   Procedure: COLONOSCOPY WITH PROPOFOL;  Surgeon: Lin Landsman, MD;  Location[facility] END[facility];  Service: Endoscopy;  Laterality: N/A;  . DG THUMB LEFT HAND  01/24/2018   repair joint in left thumb  . DORSAL COMPARTMENT RELEASE Left 10/25/2014   Procedure: LEFT FIRST  DORSAL COMPARTMENT RELEASE AND RADIAL TENOSYNOVECTOMY ;  Surgeon: Roseanne Kaufman, MD;  Location: [place];  Service: Orthopedics;  Laterality: Left;  . HAND SURGERY    . KNEE SURGERY Right 2012   meniscus tear  . KNEE SURGERY  2012  . LAMINECTOMY  1995  . TUBAL LIGATION  1982  . WRIST SURGERY Right     Allergies  Allergen Reactions  . Morphine Rash and Swelling  .  Morphine And Related Hives    Nausea and vomiting  Nausea and vomiting  . Oxycodone Hives, Itching, Rash and Swelling  . Oxycodone Hcl Swelling, Hives and Itching  . Morphine Sulfate Itching and Nausea And Vomiting    REACTION: swelling    Current Outpatient Medications:  .  AIMOVIG 140 MG/ML SOAJ, , Disp: , Rfl:  .  amLODipine (NORVASC) 5 MG tablet, Take 1.5 tablets (7.5 mg total) by mouth daily., Disp: 135 tablet, Rfl: 1 .  atorvastatin (LIPITOR) 40 MG tablet, TAKE 1 TABLET BY MOUTH EVERY DAY, Disp: 90 tablet, Rfl: 2 .  baclofen (LIORESAL) 10 MG tablet, Take 10 mg by mouth 3 (three) times daily as needed., Disp: , Rfl:  .  BOTOX 100 UNITS SOLR injection, Inject into the muscle every 3 (three) months. , Disp: , Rfl:  .   chlorproMAZINE (THORAZINE) 25 MG tablet, Take 25 mg by mouth 3 (three) times daily as needed. , Disp: , Rfl:  .  clindamycin (CLEOCIN T) 1 % lotion, APPLY TO AFFECTED AREA OF THE FACE AS NEEDED FOR ACNE, Disp: , Rfl:  .  CYCLOBENZAPRINE HCL PO, Take 1 tablet by mouth daily as needed., Disp: , Rfl:  .  ezetimibe (ZETIA) 10 MG tablet, Take 1 tablet (10 mg total) by mouth daily., Disp: 90 tablet, Rfl: 3 .  fluocinonide ointment (LIDEX) 1.61 %, Apply 1 application topically 2 (two) times daily., Disp: 30 g, Rfl: 1 .  fluticasone (FLONASE) 50 MCG/ACT nasal spray, PLACE 2 SPRAYS INTO BOTH NOSTRILS DAILY. (Patient taking differently: PLACE 2 SPRAYS INTO BOTH NOSTRILS DAILY AS NEEDED.), Disp: 48 g, Rfl: 3 .  furosemide (LASIX) 20 MG tablet, Take 1 tablet (20 mg total) by mouth daily. May take extra tab for weight gain, Disp: 45 tablet, Rfl: 6 .  lamoTRIgine (LAMICTAL) 100 MG tablet, Take 100 mg by mouth 2 (two) times daily., Disp: , Rfl:  .  Lancets (ONETOUCH ULTRASOFT) lancets, Test fasting each morning and 2 hours before supper. Retest if having hypoglycemic symptoms., Disp: 100 each, Rfl: 12 .  metFORMIN (GLUCOPHAGE) 500 MG tablet, TAKE 2 TABLETS BY MOUTH IN THE MORNING ,1 TABLET AT LUNCH, & TAKE 2 TABLETS IN THE EVENING, Disp: 450 tablet, Rfl: 1 .  metoprolol tartrate (LOPRESSOR) 100 MG tablet, Take 1 tablet (100 mg total) by mouth 2 (two) times daily., Disp: 60 tablet, Rfl: 6 .  ONETOUCH VERIO test strip, TEST FASTING GLUCOSE DAILY AS DIRECTED, Disp: 100 each, Rfl: 3 .  TEGRETOL-XR 400 MG 12 hr tablet, TAKE 1 TABLET BY MOUTH TWICE A DAY, Disp: 180 tablet, Rfl: 0 .  VIIBRYD 40 MG TABS, TAKE 1 TABLET BY MOUTH EVERY DAY, Disp: 30 tablet, Rfl: 3 .  levocetirizine (XYZAL) 5 MG tablet, TAKE 1 TABLET BY MOUTH EVERY DAY IN THE EVENING (Patient not taking: Reported on 03/20/2019), Disp: 90 tablet, Rfl: 1 .  loperamide (IMODIUM A-D) 2 MG tablet, Take 1 tablet (2 mg total) by mouth 4 (four) times daily as needed  for diarrhea or loose stools. (Patient not taking: Reported on 03/20/2019), Disp: 30 tablet, Rfl: 0 .  oxyCODONE (OXY IR/ROXICODONE) 5 MG immediate release tablet, Take 5 mg by mouth as needed. , Disp: , Rfl: 0 .  promethazine (PHENERGAN) 25 MG tablet, promethazine 25 mg tablet  TAKE 1 TABLET BY MOUTH AS NEEDED FOR NAUSEA, Disp: , Rfl:   Review of Systems  Constitutional: Positive for fatigue. Negative for appetite change, chills and fever.  Eyes: Negative for  visual disturbance.  Respiratory: Negative for chest tightness and shortness of breath.   Cardiovascular: Negative for chest pain and palpitations.  Gastrointestinal: Negative for abdominal pain, nausea and vomiting.  Neurological: Positive for headaches. Negative for dizziness and weakness.    Social History   Tobacco Use  . Smoking status: Former Smoker    Types: Cigarettes    Quit date: 01/03/1983    Years since quitting: 36.2  . Smokeless tobacco: Never Used  . Tobacco comment: quit in 1984  Substance Use Topics  . Alcohol use: No    Alcohol/week: 0.0 standard drinks      Objective:   BP (!) 145/75 (BP Location: Left Arm, Patient Position: Sitting, Cuff Size: Large)   Pulse 72   Temp 97.8 F (36.6 C) (Temporal)   Resp 16   Wt 219 lb 3.2 oz (99.4 kg)   LMP  (LMP Unknown)   BMI 38.83 kg/m  Vitals:   03/20/19 1342  BP: (!) 145/75  Pulse: 72  Resp: 16  Temp: 97.8 F (36.6 C)  TempSrc: Temporal  Weight: 219 lb 3.2 oz (99.4 kg)  Body mass index is 38.83 kg/m.  Physical Exam Constitutional:      General: She is not in acute distress.    Appearance: She is well-developed.  HENT:     Head: Normocephalic and atraumatic.     Right Ear: Hearing, tympanic membrane, ear canal and external ear normal.     Left Ear: Hearing, tympanic membrane, ear canal and external ear normal.     Nose: Nose normal.  Eyes:     General: Lids are normal. No scleral icterus.       Right eye: No discharge.        Left eye: No  discharge.     Extraocular Movements: Extraocular movements intact.     Conjunctiva/sclera: Conjunctivae normal.  Cardiovascular:     Rate and Rhythm: Normal rate and regular rhythm.     Heart sounds: Normal heart sounds.  Pulmonary:     Effort: Pulmonary effort is normal. No respiratory distress.     Breath sounds: Normal breath sounds.  Abdominal:     General: Bowel sounds are normal.  Musculoskeletal: Normal range of motion.  Skin:    Findings: No lesion or rash.  Neurological:     Mental Status: She is alert and oriented to person, place, and time.  Psychiatric:        Speech: Speech normal.        Behavior: Behavior normal.        Thought Content: Thought content normal.       Assessment & Plan     1. Facial swelling Onset 03-18-19 on the left cheek, left edge of tongue and lips. Cleared in a few hours without difficulty swallowing or breathing. Suspect angioedema. As it went away, she had some discomfort in the right ear down the right side of her neck. No rash or facial weakness. May use Xyzal qd that she had not taken for a few weeks.   2. Chronic migraine without aura without status migrainosus, not intractable Dr. Domingo Cocking (neurologist) has been giving her Aimovig injections once a month the past 3-4 months. Last dosage was Thursday 03-15-19. Also getting Botox every 3 months. Recommend she notify him of this suspected angioedema in case it is an allergic reaction to the [place]. May use Xyzal 5 mg qd or Benadryl. Recheck prn.  Vernie Murders, [place]  [facility]

## 2019-03-22 ENCOUNTER — Other Ambulatory Visit: Payer: Self-pay

## 2019-03-22 ENCOUNTER — Ambulatory Visit: Payer: Self-pay

## 2019-03-22 ENCOUNTER — Emergency Department
Admission: EM | Admit: 2019-03-22 | Discharge: 2019-03-22 | Disposition: A | Discharge status: Home / Self Care | Payer: Medicare Other | Attending: Student | Admitting: Student

## 2019-03-22 ENCOUNTER — Encounter: Payer: Self-pay | Admitting: Emergency Medicine

## 2019-03-22 DIAGNOSIS — Z79899 Other long term (current) drug therapy: Secondary | ICD-10-CM | POA: Insufficient documentation

## 2019-03-22 DIAGNOSIS — I10 Essential (primary) hypertension: Secondary | ICD-10-CM | POA: Diagnosis not present

## 2019-03-22 DIAGNOSIS — R220 Localized swelling, mass and lump, head: Secondary | ICD-10-CM | POA: Insufficient documentation

## 2019-03-22 DIAGNOSIS — E119 Type 2 diabetes mellitus without complications: Secondary | ICD-10-CM | POA: Insufficient documentation

## 2019-03-22 DIAGNOSIS — Z87891 Personal history of nicotine dependence: Secondary | ICD-10-CM | POA: Insufficient documentation

## 2019-03-22 DIAGNOSIS — Z7984 Long term (current) use of oral hypoglycemic drugs: Secondary | ICD-10-CM | POA: Diagnosis not present

## 2019-03-22 MED ORDER — METHYLPREDNISOLONE SODIUM SUCC 125 MG IJ SOLR
125.0000 mg | Freq: Once | INTRAMUSCULAR | Status: AC
  Administered 2019-03-22: 15:00:00 125 mg via INTRAVENOUS
  Filled 2019-03-22: qty 2

## 2019-03-22 MED ORDER — EPINEPHRINE 0.3 MG/0.3ML IJ SOAJ: 0.3000 mg | INTRAMUSCULAR | 0 refills | Status: DC | PRN

## 2019-03-22 MED ORDER — SODIUM CHLORIDE 0.9 % IV SOLN
40.0000 mg | Freq: Once | INTRAVENOUS | Status: AC
  Administered 2019-03-22: 15:00:00 40 mg via INTRAVENOUS
  Filled 2019-03-22: qty 4

## 2019-03-22 NOTE — ED Triage Notes (Signed)
C/O swelling to right side of tongue this morning.  States woke up this morning with swelling.  STates swelling has improved, but has not resolved.  Took 2 25 mg Benadryl at 1230.  States swelling has improved since then.  AAOx3.  Skin warm and dry. Voice clear and strong.  NAD

## 2019-03-22 NOTE — Telephone Encounter (Signed)
FYI

## 2019-03-22 NOTE — ED Provider Notes (Signed)
3:51 PM Assumed care for off going team.   Blood pressure (!) 162/93, pulse (!) 54, temperature 99.5 F (37.5 C), temperature source Oral, resp. rate 16, height 5\' 3"  (1.6 m), weight 99.3 kg, SpO2 97 %.  See their HPI for full report but in brief Re-eval at 4-430 R tongue swelling, got allergy meds, sent off hereditary test vs pecans. Pt can f/u at home with them with PCP.   Reevaluated patient.  Tongue swelling has completely resolved.  Patient does not have an EpiPen at home so we discussed appropriate use of it and will prescribe her 1.  Patient will follow up with her PCP for the above and also discussed allergy testing.  I discussed the provisional nature of ED diagnosis, the treatment so far, the ongoing plan of care, follow up appointments and return precautions with the patient and any family or support people present. They expressed understanding and agreed with the plan, discharged home.             Vanessa Ridgeway, MD 03/22/19 [phone]

## 2019-03-22 NOTE — ED Provider Notes (Signed)
[facility] Emergency Department Provider Note  ____________________________________________   First MD Initiated Contact with Patient 03/22/19 1438     (approximate)  I have reviewed the triage vital signs and the nursing notes.  History  Chief Complaint No chief complaint on file.    HPI Abigail Figueroa is a 58 y.o. female who presents for right sided tongue swelling. She noticed symptoms this morning. She denies any difficulty breathing or swallowing. No voices changes. No GI symptoms or rashes. She reports recent left sided lip/face swelling several days ago. She took 50 mg Benadryl earlier today w/ improvement in symptoms. She is wondering if she may be having a reaction to pecans as she had eaten some prior to both episodes of swelling. She is not on lisinopril.     Past Medical Hx Past Medical History:  Diagnosis Date  . Abnormal stress test    a. 10/2009: Normal myocardial perfusion imaging; b. 08/2015 MV: medium defect of mod severity in mid ant apical region w/ mild HK of the distal inf wall;  c. 08/2015 Cath: nl Cors, EF 60%.  . Anemia   . Anxiety   . Arthritis   . Bell's palsy   . Depression   . Edema   . Essential hypertension   . Frequent urination   . Frequent urination at night   . Headache    migraines, gets botox injections  . Heart palpitations June 2013   Event monitor showing sinus tachycardia, PACs with couplets and triplets.  Marland Kitchen Hx of echocardiogram    a. 10/2009 Echo: showed normal left ventricular function, mild left ventricular hypertrophy, and no significant valve abnormalities.  . Hypercholesterolemia   . Migraine   . Morbid obesity ([place])   . Neuromuscular disorder ([place])   . OSA (obstructive sleep apnea)    has been on continuous positive airway pressure  . Seizure disorder ([place])   . Seizures ([place])   . Type II diabetes mellitus ([place])     Problem List Patient Active Problem List   Diagnosis Date Noted  . Morbid  obesity due to excess calories ([place]) 02/06/2019  . Osteoarthrosis of hand 12/28/2017  . Special screening for malignant neoplasms, colon   . Rhinitis, allergic 06/18/2017  . Right-sided Bell's palsy 02/02/2016  . Spondylolisthesis of lumbar region 08/25/2015  . Type II diabetes mellitus ([place])   . Abnormal stress test   . Hypercholesterolemia   . Abnormal nuclear stress test 07/30/2015  . Preoperative clearance 07/22/2015  . Lower extremity edema 07/22/2015  . History of brain disorder 11/05/2014  . H/O: obesity 11/05/2014  . Depression, major, recurrent, moderate ([place]) 11/05/2014  . Difficulty with family 11/05/2014  . Feeling stressed out 11/05/2014  . H/O: HTN (hypertension) 11/05/2014  . H/O elevated lipids 11/05/2014  . Anxiety, generalized 11/05/2014  . Migraine headache 11/05/2014  . Moderate recurrent major depression ([place]) 10/16/2014  . Generalized anxiety disorder 10/16/2014  . CHF (congestive heart failure) ([place]) 10/14/2014  . Edema 10/14/2014  . Depressive disorder 10/14/2014  . Obstructive sleep apnea 10/14/2014  . Diabetes ([place]) 10/14/2014  . Essential hypertension 10/14/2014  . Epilepsy ([place]) 10/14/2014  . Migraine 10/14/2014  . Hyperlipemia 10/14/2014  . Dysthymic disorder 10/14/2014  . Seizure ([place]) 10/14/2014  . Hypercholesteremia 10/14/2014  . Palpitation 10/14/2014  . Depression 10/14/2014  . Dyspepsia 10/14/2014  . Hematochezia 10/14/2014  . Urge incontinence 10/14/2014  . Hair thinning 10/14/2014  . Dermatitis 09/10/2014  . Acid indigestion 09/10/2014  .  Alopecia 09/10/2014  . Blood in feces 09/10/2014  . H/O hypercholesterolemia 09/10/2014  . Arthralgia of hip 09/10/2014  . Feeling bilious 09/10/2014  . Awareness of heartbeats 09/10/2014  . Pain in the wrist 09/10/2014  . Convulsions ([place]) 09/10/2014  . Urge incontinence 09/10/2014  . Snapping thumb syndrome 04/19/2014  . Diabetes mellitus ([place]) 03/14/2014  . De Quervain's disease (radial  styloid tenosynovitis) 03/08/2014  . LBP (low back pain) 03/07/2013  . Mixed hyperlipidemia 01/15/2013  . Encounter for long-term (current) use of other medications - statin 01/15/2013  . Fatigue 01/15/2013  . Heart palpation 01/15/2013  . Obesity (BMI 30-39.9) 01/02/2013  . CCF (congestive cardiac failure) ([place]) 11/13/2009  . Malaise and fatigue 02/17/2009  . Sprain and strain of interphalangeal (joint) of hand 10/23/2007  . HEADACHE 06/23/2007  . Diabetes mellitus type 2 in obese ([place]) 05/08/2007  . HYPERCALCEMIA 05/08/2007  . Sleep apnea 05/08/2007  . Edema 05/08/2007  . DIARRHEA, CHRONIC 05/08/2007  . Clinical depression 10/20/2006  . Obstructive apnea 03/22/2006  . Diabetes mellitus, type 2 ([place]) 11/12/2005  . Depression, neurotic 11/12/2005  . Epilepsy ([place]) 11/12/2005  . Combined fat and carbohydrate induced hyperlipemia 11/12/2005  . Acute onset aura migraine 11/12/2005    Past Surgical Hx Past Surgical History:  Procedure Laterality Date  . ABDOMINAL HYSTERECTOMY  1990   without BSO  . BACK SURGERY  1900s 2001   x2 with "cage put in"  . BILATERAL SALPINGOOPHORECTOMY  1990  . CARDIAC CATHETERIZATION N/A 08/06/2015   Procedure: Left Heart Cath and Coronary Angiography;  Surgeon: Troy Sine, MD;  Location: [facility];  Service: Cardiovascular;  Laterality: N/A;  . CARPAL TUNNEL RELEASE Bilateral   . CHOLECYSTECTOMY  1980  . COLONOSCOPY WITH PROPOFOL N/A 08/30/2017   Procedure: COLONOSCOPY WITH PROPOFOL;  Surgeon: Lin Landsman, MD;  Location: [facility] ENDOSCOPY;  Service: Endoscopy;  Laterality: N/A;  . DG THUMB LEFT HAND  01/24/2018   repair joint in left thumb  . DORSAL COMPARTMENT RELEASE Left 10/25/2014   Procedure: LEFT FIRST  DORSAL COMPARTMENT RELEASE AND RADIAL TENOSYNOVECTOMY ;  Surgeon: Roseanne Kaufman, MD;  Location: [facility];  Service: Orthopedics;  Laterality: Left;  . HAND SURGERY    . KNEE SURGERY Right 2012   meniscus tear   . KNEE SURGERY  2012  . LAMINECTOMY  1995  . TUBAL LIGATION  1982  . WRIST SURGERY Right     Medications Prior to Admission medications   Medication Sig Start Date End Date Taking? Authorizing Provider  AIMOVIG 140 MG/ML SOAJ Inject 140 mg into the skin every 30 (thirty) days.  03/15/19  Yes [provider]  amLODipine (NORVASC) 5 MG tablet Take 1.5 tablets (7.5 mg total) by mouth daily. 09/21/18  Yes Troy Sine, MD  atorvastatin (LIPITOR) 40 MG tablet TAKE 1 TABLET BY MOUTH EVERY DAY Patient taking differently: Take 40 mg by mouth daily.  11/06/18  Yes Troy Sine, MD  baclofen (LIORESAL) 10 MG tablet Take 10 mg by mouth 2 (two) times daily as needed for muscle spasms.    Yes [provider]  BOTOX 100 UNITS SOLR injection Inject into the muscle every 3 (three) months.  11/01/12  Yes [provider]  chlorproMAZINE (THORAZINE) 25 MG tablet Take 25 mg by mouth 3 (three) times daily as needed for nausea.    Yes [provider]  ezetimibe (ZETIA) 10 MG tablet Take 1 tablet (10 mg total) by mouth  daily. 03/01/19 05/30/19 Yes Troy Sine, MD  fluocinonide ointment (LIDEX) 6.83 % Apply 1 application topically 2 (two) times daily. 03/02/19  Yes Chrismon, Vickki Muff, PA  fluticasone (FLONASE) 50 MCG/ACT nasal spray PLACE 2 SPRAYS INTO BOTH NOSTRILS DAILY. Patient taking differently: Place 2 sprays into both nostrils daily as needed for allergies or rhinitis.  12/06/16  Yes Chrismon, Vickki Muff, PA  furosemide (LASIX) 20 MG tablet Take 1 tablet (20 mg total) by mouth daily. May take extra tab for weight gain 03/01/19  Yes Troy Sine, MD  lamoTRIgine (LAMICTAL) 100 MG tablet Take 100 mg by mouth 2 (two) times daily.   Yes [provider]  Lancets (ONETOUCH ULTRASOFT) lancets Test fasting each morning and 2 hours before supper. Retest if having hypoglycemic symptoms. 11/15/16  Yes Chrismon, Vickki Muff, PA  levocetirizine (XYZAL) 5 MG tablet TAKE 1 TABLET  BY MOUTH EVERY DAY IN THE EVENING Patient taking differently: Take 5 mg by mouth every evening. TAKE 1 TABLET BY MOUTH EVERY DAY IN THE EVENING 03/02/19  Yes Chrismon, Vickki Muff, PA  metFORMIN (GLUCOPHAGE) 500 MG tablet TAKE 2 TABLETS BY MOUTH IN THE MORNING ,1 TABLET AT LUNCH, & TAKE 2 TABLETS IN THE EVENING Patient taking differently: Take 500-1,000 mg by mouth See admin instructions. Take 2 tablets (1000mg ) by mouth every morning, 1 tablet (500mg ) by mouth at lunchtime and 2 tablets (1000mg ) by mouth every evening 01/15/19  Yes Chrismon, Vickki Muff, PA  metoprolol tartrate (LOPRESSOR) 100 MG tablet Take 1 tablet (100 mg total) by mouth 2 (two) times daily. 03/01/19  Yes Troy Sine, MD  ONETOUCH VERIO test strip TEST FASTING GLUCOSE DAILY AS DIRECTED Patient taking differently: 1 each by Other route as directed.  08/10/18  Yes Chrismon, Vickki Muff, PA  TEGRETOL-XR 400 MG 12 hr tablet TAKE 1 TABLET BY MOUTH TWICE A DAY Patient taking differently: Take 400 mg by mouth 2 (two) times daily.  01/18/19  Yes Chrismon, Dennis E, PA  VIIBRYD 40 MG TABS TAKE 1 TABLET BY MOUTH EVERY DAY Patient taking differently: Take 40 mg by mouth daily.  02/25/19  Yes Chrismon, Vickki Muff, PA  loperamide (IMODIUM A-D) 2 MG tablet Take 1 tablet (2 mg total) by mouth 4 (four) times daily as needed for diarrhea or loose stools. Patient not taking: Reported on 03/20/2019 11/25/17   Lin Landsman, MD    Allergies Morphine, Morphine and related, Oxycodone, Oxycodone hcl, and Morphine sulfate  Family Hx Family History  Problem Relation Age of Onset  . Sarcoidosis Mother   . Seizures Mother   . Heart attack Mother   . Alzheimer's disease Maternal Grandmother   . Dementia Maternal Grandmother   . Hypertension Maternal Grandfather   . Diabetes Maternal Grandfather   . Breast cancer Neg Hx     Social Hx Social History   Tobacco Use  . Smoking status: Former Smoker    Types: Cigarettes    Quit date: 01/03/1983     Years since quitting: 36.2  . Smokeless tobacco: Never Used  . Tobacco comment: quit in 1984  Substance Use Topics  . Alcohol use: No    Alcohol/week: 0.0 standard drinks  . Drug use: No     Review of Systems  Constitutional: Negative for fever, chills. Eyes: Negative for visual changes. ENT: Negative for sore throat. + tongue swelling Cardiovascular: Negative for chest pain. Respiratory: Negative for shortness of breath. Gastrointestinal: Negative for nausea, vomiting.  Genitourinary: Negative for  dysuria. Musculoskeletal: Negative for leg swelling. Skin: Negative for rash. Neurological: Negative for for headaches.   Physical Exam  Vital Signs: ED Triage Vitals  Enc Vitals Group     BP 03/22/19 1327 (!) 146/74     Pulse Rate 03/22/19 1327 62     Resp 03/22/19 1327 18     Temp 03/22/19 1327 99.5 F (37.5 C)     Temp Source 03/22/19 1327 Oral     SpO2 03/22/19 1327 98 %     Weight 03/22/19 1327 219 lb (99.3 kg)     Height 03/22/19 1327 5\' 3"  (1.6 m)     Head Circumference --      Peak Flow --      Pain Score 03/22/19 1332 0     Pain Loc --      Pain Edu? --      Excl. in [place]? --     Constitutional: Alert and oriented.  Head: Normocephalic. Atraumatic. Eyes: Conjunctivae clear. Sclera anicteric. Nose: No congestion. No rhinorrhea. Mouth/Throat: Mild R sided tongue swelling. No lip of uvula swelling. No voices changes.  Neck: No stridor.   Cardiovascular: Normal rate, regular rhythm. Extremities well perfused. Respiratory: Normal respiratory effort.  Lungs CTAB. No wheezing.  Gastrointestinal: Soft. Non-tender. Non-distended.  Musculoskeletal: No lower extremity edema. No deformities. Neurologic:  Normal speech and language. No gross focal neurologic deficits are appreciated.  Skin: Skin is warm, dry and intact. No rash noted. No urticaria.  Psychiatric: Mood and affect are appropriate for  situation.  EKG  N/A    Radiology  N/A   Procedures  Procedure(s) performed (including critical care):  Procedures   Initial Impression / Assessment and Plan / ED Course  58 y.o. female who presents to the ED for right sided tongue swelling, as above.   On exam she is mild to minimal swelling of the R sided tongue. No lip, uvular swelling. No voices changes. No wheezing. No rashes.   Will complete allergy regimen with famotidine and steroids in case of reaction to pecans. Given this is her 2nd angioedema episode, will send compliment levels to investigate for hereditary angioedema. Will have her f/u with her PCP for this.   Will plan to monitor for ~1 hours after medications and if remains stable/improved plan for discharge.    Final Clinical Impression(s) / ED Diagnosis  Final diagnoses:  Tongue swelling       Note:  This document was prepared using Dragon voice recognition software and may include unintentional dictation errors.   Lilia Pro., MD 03/22/19 [phone]

## 2019-03-22 NOTE — Telephone Encounter (Signed)
Pt. And husband reports she had tongue swelling 03/18/19 which went away. Tuesday saw her PCP. Today the swelling has returned and she feels she is having some difficulty swallowing. Lips are not swollen. Has taken 2 doses of Benadryl. No difficulty breathing.Instructed husband if she is having difficulty breathing to call 911. States they are close to [facility] and will go to ED.Instructed to go now.  Reason for Disposition . Sounds like a life-threatening emergency to the triager  Answer Assessment - Initial Assessment Questions 1. ONSET: "When did the swelling start?" (e.g., minutes, hours, days)     This morning 2. LOCATION: "What part of the tongue is swollen?"     Left side 3. SEVERITY: "How swollen is it?"     Moderate 4. CAUSE: "What do you think is causing the tongue swelling?" (e.g., hx of angioedema, allergies)     No 5. RECURRENT SYMPTOM: "Have you had tongue swelling before?" If so, ask: "When was the last time?" "What happened that time?"     Yes 6. OTHER SYMPTOMS: "Do you have any other symptoms?" (e.g., difficulty breathing, facial swelling)     Difficulty swallowing 7. PREGNANCY: "Is there any chance you are pregnant?" "When was your last menstrual period?"     No  Protocols used: TONGUE SWELLING-A-AH

## 2019-03-23 LAB — C4 COMPLEMENT: Complement C4, Body Fluid: 46 mg/dL — ABNORMAL HIGH (ref 12–38)

## 2019-03-26 LAB — C1 ESTERASE INHIBITOR: C1INH SerPl-mCnc: 36 mg/dL (ref 21–39)

## 2019-03-28 DIAGNOSIS — M791 Myalgia, unspecified site: Secondary | ICD-10-CM | POA: Diagnosis not present

## 2019-03-28 DIAGNOSIS — G518 Other disorders of facial nerve: Secondary | ICD-10-CM | POA: Diagnosis not present

## 2019-03-28 DIAGNOSIS — M542 Cervicalgia: Secondary | ICD-10-CM | POA: Diagnosis not present

## 2019-03-28 DIAGNOSIS — G43719 Chronic migraine without aura, intractable, without status migrainosus: Secondary | ICD-10-CM | POA: Diagnosis not present

## 2019-05-02 ENCOUNTER — Telehealth: Payer: Self-pay | Admitting: Cardiovascular Disease

## 2019-05-02 NOTE — Telephone Encounter (Signed)
Patient is calling to check on when her order for her CPAP supplies was sent to the DME provider. Please call to discuss.

## 2019-05-03 DIAGNOSIS — G4733 Obstructive sleep apnea (adult) (pediatric): Secondary | ICD-10-CM | POA: Diagnosis not present

## 2019-05-07 DIAGNOSIS — M791 Myalgia, unspecified site: Secondary | ICD-10-CM | POA: Diagnosis not present

## 2019-05-07 DIAGNOSIS — G43719 Chronic migraine without aura, intractable, without status migrainosus: Secondary | ICD-10-CM | POA: Diagnosis not present

## 2019-05-07 DIAGNOSIS — M542 Cervicalgia: Secondary | ICD-10-CM | POA: Diagnosis not present

## 2019-05-15 ENCOUNTER — Encounter: Payer: Self-pay | Admitting: Physician Assistant

## 2019-05-15 ENCOUNTER — Ambulatory Visit: Payer: Medicare Other | Attending: Internal Medicine

## 2019-05-15 ENCOUNTER — Ambulatory Visit (INDEPENDENT_AMBULATORY_CARE_PROVIDER_SITE_OTHER): Payer: Medicare Other | Admitting: Physician Assistant

## 2019-05-15 VITALS — BP 154/79 | HR 78

## 2019-05-15 DIAGNOSIS — Z20822 Contact with and (suspected) exposure to covid-19: Secondary | ICD-10-CM | POA: Diagnosis not present

## 2019-05-15 DIAGNOSIS — N644 Mastodynia: Secondary | ICD-10-CM

## 2019-05-15 NOTE — Progress Notes (Signed)
Patient: Abigail Figueroa Female    DOB: 12-05-1960   59 y.o.   MRN: [id number] Visit Date: 05/15/2019  Today's Provider: Trinna Post, PA-C   Chief Complaint  Patient presents with  . Hypertension  . Diabetes   Subjective:    I, Porsha McClurkin,CMA am acting as a Education administrator for [employer].  Virtual Visit via Telephone Note  I connected with Abigail Figueroa on 05/15/19 at  2:40 PM EST by telephone and verified that I am speaking with the correct person using two identifiers.  Location: Patient: Home Provider: Office   I discussed the limitations, risks, security and privacy concerns of performing an evaluation and management service by telephone and the availability of in person appointments. I also discussed with the patient that there may be a patient responsible charge related to this service. The patient expressed understanding and agreed to proceed.   HPI Reports right sided breast pain ongoing for several weeks with no inciting factors. Denies injuries and heavy lifting. Denies breast masses, discharge, skin changes. She reports it happens 5-6 times per day and worsens when she bends over. Describes it as a pulling sensation. Describes the pain as being located on the outside of the breast. Denies shortness of breath, nausea, and vomiting.  ------------------------------------------------------------------------   Allergies  Allergen Reactions  . Morphine Rash and Swelling  . Morphine And Related Hives    Nausea and vomiting  Nausea and vomiting  . Oxycodone Hives, Itching, Rash and Swelling  . Oxycodone Hcl Swelling, Hives and Itching  . Morphine Sulfate Itching and Nausea And Vomiting    REACTION: swelling     Current Outpatient Medications:  .  AIMOVIG 140 MG/ML SOAJ, Inject 140 mg into the skin every 30 (thirty) days. , Disp: , Rfl:  .  amLODipine (NORVASC) 5 MG tablet, Take 1.5 tablets (7.5 mg total) by mouth daily., Disp: 135 tablet, Rfl:  1 .  atorvastatin (LIPITOR) 40 MG tablet, TAKE 1 TABLET BY MOUTH EVERY DAY (Patient taking differently: Take 40 mg by mouth daily. ), Disp: 90 tablet, Rfl: 2 .  baclofen (LIORESAL) 10 MG tablet, Take 10 mg by mouth 2 (two) times daily as needed for muscle spasms. , Disp: , Rfl:  .  BOTOX 100 UNITS SOLR injection, Inject into the muscle every 3 (three) months. , Disp: , Rfl:  .  chlorproMAZINE (THORAZINE) 25 MG tablet, Take 25 mg by mouth 3 (three) times daily as needed for nausea. , Disp: , Rfl:  .  ezetimibe (ZETIA) 10 MG tablet, Take 1 tablet (10 mg total) by mouth daily., Disp: 90 tablet, Rfl: 3 .  fluocinonide ointment (LIDEX) 1.02 %, Apply 1 application topically 2 (two) times daily., Disp: 30 g, Rfl: 1 .  fluticasone (FLONASE) 50 MCG/ACT nasal spray, PLACE 2 SPRAYS INTO BOTH NOSTRILS DAILY. (Patient taking differently: Place 2 sprays into both nostrils daily as needed for allergies or rhinitis. ), Disp: 48 g, Rfl: 3 .  furosemide (LASIX) 20 MG tablet, Take 1 tablet (20 mg total) by mouth daily. May take extra tab for weight gain, Disp: 45 tablet, Rfl: 6 .  lamoTRIgine (LAMICTAL) 100 MG tablet, Take 100 mg by mouth 2 (two) times daily., Disp: , Rfl:  .  levocetirizine (XYZAL) 5 MG tablet, TAKE 1 TABLET BY MOUTH EVERY DAY IN THE EVENING (Patient taking differently: Take 5 mg by mouth every evening. TAKE 1 TABLET BY MOUTH EVERY DAY IN THE EVENING), Disp: 90  tablet, Rfl: 1 .  metFORMIN (GLUCOPHAGE) 500 MG tablet, TAKE 2 TABLETS BY MOUTH IN THE MORNING ,1 TABLET AT LUNCH, & TAKE 2 TABLETS IN THE EVENING (Patient taking differently: Take 500-1,000 mg by mouth See admin instructions. Take 2 tablets (1000mg ) by mouth every morning, 1 tablet (500mg ) by mouth at lunchtime and 2 tablets (1000mg ) by mouth every evening), Disp: 450 tablet, Rfl: 1 .  metoprolol tartrate (LOPRESSOR) 100 MG tablet, Take 1 tablet (100 mg total) by mouth 2 (two) times daily., Disp: 60 tablet, Rfl: 6 .  TEGRETOL-XR 400 MG 12 hr  tablet, TAKE 1 TABLET BY MOUTH TWICE A DAY (Patient taking differently: Take 400 mg by mouth 2 (two) times daily. ), Disp: 180 tablet, Rfl: 0 .  VIIBRYD 40 MG TABS, TAKE 1 TABLET BY MOUTH EVERY DAY (Patient taking differently: Take 40 mg by mouth daily. ), Disp: 30 tablet, Rfl: 3 .  Lancets (ONETOUCH ULTRASOFT) lancets, Test fasting each morning and 2 hours before supper. Retest if having hypoglycemic symptoms., Disp: 100 each, Rfl: 12 .  loperamide (IMODIUM A-D) 2 MG tablet, Take 1 tablet (2 mg total) by mouth 4 (four) times daily as needed for diarrhea or loose stools. (Patient not taking: Reported on 03/20/2019), Disp: 30 tablet, Rfl: 0 .  ONETOUCH VERIO test strip, TEST FASTING GLUCOSE DAILY AS DIRECTED (Patient taking differently: 1 each by Other route as directed. ), Disp: 100 each, Rfl: 3  Review of Systems  Social History   Tobacco Use  . Smoking status: Former Smoker    Types: Cigarettes    Quit date: 01/03/1983    Years since quitting: 36.3  . Smokeless tobacco: Never Used  . Tobacco comment: quit in 1984  Substance Use Topics  . Alcohol use: No    Alcohol/week: 0.0 standard drinks      Objective:   BP (!) 154/79 (BP Location: Right Arm, Patient Position: Sitting, Cuff Size: Normal)   Pulse 78   LMP  (LMP Unknown)  Vitals:   05/15/19 1442  BP: (!) 154/79  Pulse: 78  There is no height or weight on file to calculate BMI.   Physical Exam   No results found for any visits on 05/15/19.     Assessment & Plan     1. Breast pain, right  Sounds MSK and recommend using the baclofen she has prescribe for migraines and observe for improvement. However, she is due for breast cancer screening and I have ordered mammogram for her, explained self scheduling.   - MM Digital Screening; Future  I discussed the assessment and treatment plan with the patient. The patient was provided an opportunity to ask questions and all were answered. The patient agreed with the plan and  demonstrated an understanding of the instructions.   The patient was advised to call back or seek an in-person evaluation if the symptoms worsen or if the condition fails to improve as anticipated.  I provided 15 minutes of non-face-to-face time during this encounter.  The entirety of the information documented in the History of Present Illness, Review of Systems and Physical Exam were personally obtained by me. Portions of this information were initially documented by [facility] and reviewed by me for thoroughness and accuracy.      Trinna Post, PA-C  [facility]

## 2019-05-16 NOTE — Patient Instructions (Signed)

## 2019-05-17 LAB — NOVEL CORONAVIRUS, NAA: SARS-CoV-2, NAA: NOT DETECTED

## 2019-05-24 NOTE — Telephone Encounter (Signed)
Spoke with patient. She did get hr CPAP supplies but is unhappy with [facility] and would like to be sent somewhere close to where she lives in [place]. I told her I will see who I can send her to and transfer her CPAP care.

## 2019-05-29 ENCOUNTER — Other Ambulatory Visit: Payer: Self-pay

## 2019-05-29 ENCOUNTER — Ambulatory Visit (INDEPENDENT_AMBULATORY_CARE_PROVIDER_SITE_OTHER): Payer: Medicare Other | Admitting: Family Medicine

## 2019-05-29 ENCOUNTER — Encounter: Payer: Self-pay | Admitting: Family Medicine

## 2019-05-29 VITALS — BP 152/81 | HR 68 | Temp 96.3°F | Resp 16 | Ht 63.0 in | Wt 217.2 lb

## 2019-05-29 DIAGNOSIS — M5432 Sciatica, left side: Secondary | ICD-10-CM

## 2019-05-29 MED ORDER — PREDNISONE 10 MG PO TABS: ORAL_TABLET | ORAL | 0 refills | Status: AC

## 2019-05-29 NOTE — Progress Notes (Signed)
Patient: Abigail Figueroa Female    DOB: 11/08/1960   59 y.o.   MRN: [id number] Visit Date: 05/29/2019  Today's Provider: Lelon Huh, MD   Chief Complaint  Patient presents with  . Back Pain   Subjective:     Back Pain This is a new problem. The current episode started in the past 7 days. The problem occurs intermittently. The problem has been gradually improving since onset. The quality of the pain is described as aching. The pain radiates to the left thigh. The pain is at a severity of 5/10. The pain is moderate. The pain is worse during the night. The symptoms are aggravated by position and lying down. Pertinent negatives include no abdominal pain, bladder incontinence, bowel incontinence, chest pain, dysuria, fever or weakness. She has tried muscle relaxant for the symptoms. The treatment provided no relief.  Radiates into buttocks on left but not into legs. She did take a baclofen which didn't help. States she can't take Ibuprofen or OTC analgesics because they cause her to have worse headaches.   Foot Pain: Patient complains of pain in both feet worse on left foot. Patient reports symptoms have been present for about a week. Pain "burning" is located in her heel and ankle. Patient denies any injuries. Patient reports swelling and redness.    Allergies  Allergen Reactions  . Morphine Rash and Swelling  . Morphine And Related Hives    Nausea and vomiting  Nausea and vomiting  . Oxycodone Hives, Itching, Rash and Swelling  . Oxycodone Hcl Swelling, Hives and Itching  . Morphine Sulfate Itching and Nausea And Vomiting    REACTION: swelling     Current Outpatient Medications:  .  AIMOVIG 140 MG/ML SOAJ, Inject 140 mg into the skin every 30 (thirty) days. , Disp: , Rfl:  .  amLODipine (NORVASC) 5 MG tablet, Take 1.5 tablets (7.5 mg total) by mouth daily., Disp: 135 tablet, Rfl: 1 .  atorvastatin (LIPITOR) 40 MG tablet, TAKE 1 TABLET BY MOUTH EVERY DAY (Patient taking  differently: Take 40 mg by mouth daily. ), Disp: 90 tablet, Rfl: 2 .  baclofen (LIORESAL) 10 MG tablet, Take 10 mg by mouth 2 (two) times daily as needed for muscle spasms. , Disp: , Rfl:  .  BOTOX 100 UNITS SOLR injection, Inject into the muscle every 3 (three) months. , Disp: , Rfl:  .  chlorproMAZINE (THORAZINE) 25 MG tablet, Take 25 mg by mouth 3 (three) times daily as needed for nausea. , Disp: , Rfl:  .  ezetimibe (ZETIA) 10 MG tablet, Take 1 tablet (10 mg total) by mouth daily., Disp: 90 tablet, Rfl: 3 .  fluocinonide ointment (LIDEX) 9.41 %, Apply 1 application topically 2 (two) times daily., Disp: 30 g, Rfl: 1 .  furosemide (LASIX) 20 MG tablet, Take 1 tablet (20 mg total) by mouth daily. May take extra tab for weight gain, Disp: 45 tablet, Rfl: 6 .  lamoTRIgine (LAMICTAL) 100 MG tablet, Take 100 mg by mouth 2 (two) times daily., Disp: , Rfl:  .  Lancets (ONETOUCH ULTRASOFT) lancets, Test fasting each morning and 2 hours before supper. Retest if having hypoglycemic symptoms., Disp: 100 each, Rfl: 12 .  levocetirizine (XYZAL) 5 MG tablet, TAKE 1 TABLET BY MOUTH EVERY DAY IN THE EVENING (Patient taking differently: Take 5 mg by mouth every evening. TAKE 1 TABLET BY MOUTH EVERY DAY IN THE EVENING), Disp: 90 tablet, Rfl: 1 .  metFORMIN (GLUCOPHAGE) 500  MG tablet, TAKE 2 TABLETS BY MOUTH IN THE MORNING ,1 TABLET AT LUNCH, & TAKE 2 TABLETS IN THE EVENING (Patient taking differently: Take 500-1,000 mg by mouth See admin instructions. Take 2 tablets (1000mg ) by mouth every morning, 1 tablet (500mg ) by mouth at lunchtime and 2 tablets (1000mg ) by mouth every evening), Disp: 450 tablet, Rfl: 1 .  metoprolol tartrate (LOPRESSOR) 100 MG tablet, Take 1 tablet (100 mg total) by mouth 2 (two) times daily., Disp: 60 tablet, Rfl: 6 .  ONETOUCH VERIO test strip, TEST FASTING GLUCOSE DAILY AS DIRECTED (Patient taking differently: 1 each by Other route as directed. ), Disp: 100 each, Rfl: 3 .  TEGRETOL-XR 400 MG  12 hr tablet, TAKE 1 TABLET BY MOUTH TWICE A DAY (Patient taking differently: Take 400 mg by mouth 2 (two) times daily. ), Disp: 180 tablet, Rfl: 0 .  VIIBRYD 40 MG TABS, TAKE 1 TABLET BY MOUTH EVERY DAY (Patient taking differently: Take 40 mg by mouth daily. ), Disp: 30 tablet, Rfl: 3  Review of Systems  Constitutional: Negative for appetite change, chills, fatigue and fever.  Respiratory: Negative for chest tightness and shortness of breath.   Cardiovascular: Negative for chest pain and palpitations.  Gastrointestinal: Negative for abdominal pain, bowel incontinence, nausea and vomiting.  Genitourinary: Negative for bladder incontinence and dysuria.  Musculoskeletal: Positive for back pain and myalgias.  Neurological: Negative for dizziness and weakness.    Social History   Tobacco Use  . Smoking status: Former Smoker    Types: Cigarettes    Quit date: 01/03/1983    Years since quitting: 36.4  . Smokeless tobacco: Never Used  . Tobacco comment: quit in 1984  Substance Use Topics  . Alcohol use: No    Alcohol/week: 0.0 standard drinks      Objective:   BP (!) 152/81 (BP Location: Right Arm, Patient Position: Sitting, Cuff Size: Large)   Pulse 68   Temp (!) 96.3 F (35.7 C) (Temporal)   Resp 16   Ht 5\' 3"  (1.6 m)   Wt 217 lb 3.2 oz (98.5 kg)   LMP  (LMP Unknown)   BMI 38.48 kg/m  Vitals:   05/29/19 1520  BP: (!) 152/81  Pulse: 68  Resp: 16  Temp: (!) 96.3 F (35.7 C)  TempSrc: Temporal  Weight: 217 lb 3.2 oz (98.5 kg)  Height: 5\' 3"  (1.6 m)  Body mass index is 38.48 kg/m.   Physical Exam  General appearance: Obese female, cooperative and in no acute distress Head: Normocephalic, without obvious abnormality, atraumatic Respiratory: Respirations even and unlabored, normal respiratory rate Extremities: Tender left para lumbar muscles and lumbar spine. Normal MS left LE.  Neurologic: Mental status: Alert, oriented to person, place, and time, thought content  appropriate.  No results found for any visits on 05/29/19.     Assessment & Plan    1. Sciatica of left side Counseled to apply ice packs to painful area of back every 3-4 hours for the next two days, then migrated to heat pads.  - predniSONE (DELTASONE) 10 MG tablet; 6 tablets for 2 days, then 5 for 2 days, then 4 for 2 days, then 3 for 2 days, then 2 for 2 days, then 1 for 2 days.  Dispense: 42 tablet; Refill: 0  The entirety of the information documented in the History of Present Illness, Review of Systems and Physical Exam were personally obtained by me. Portions of this information were initially documented by Meyer Cory, CMA and  reviewed by me for thoroughness and accuracy.      Lelon Huh, MD  [facility]

## 2019-06-05 ENCOUNTER — Ambulatory Visit: Payer: Self-pay | Admitting: Family Medicine

## 2019-06-26 ENCOUNTER — Ambulatory Visit (INDEPENDENT_AMBULATORY_CARE_PROVIDER_SITE_OTHER): Payer: Medicare Other | Admitting: Family Medicine

## 2019-06-26 ENCOUNTER — Other Ambulatory Visit: Payer: Self-pay

## 2019-06-26 DIAGNOSIS — G4733 Obstructive sleep apnea (adult) (pediatric): Secondary | ICD-10-CM

## 2019-06-26 DIAGNOSIS — L309 Dermatitis, unspecified: Secondary | ICD-10-CM

## 2019-06-26 DIAGNOSIS — E119 Type 2 diabetes mellitus without complications: Secondary | ICD-10-CM

## 2019-06-26 DIAGNOSIS — G43709 Chronic migraine without aura, not intractable, without status migrainosus: Secondary | ICD-10-CM

## 2019-06-26 DIAGNOSIS — F331 Major depressive disorder, recurrent, moderate: Secondary | ICD-10-CM

## 2019-06-26 DIAGNOSIS — G40909 Epilepsy, unspecified, not intractable, without status epilepticus: Secondary | ICD-10-CM

## 2019-06-26 DIAGNOSIS — Z8679 Personal history of other diseases of the circulatory system: Secondary | ICD-10-CM | POA: Diagnosis not present

## 2019-06-26 DIAGNOSIS — N3946 Mixed incontinence: Secondary | ICD-10-CM

## 2019-06-26 DIAGNOSIS — E7800 Pure hypercholesterolemia, unspecified: Secondary | ICD-10-CM | POA: Diagnosis not present

## 2019-06-26 MED ORDER — FLUOCINONIDE 0.05 % EX OINT: 1.0000 "application " | TOPICAL_OINTMENT | Freq: Two times a day (BID) | CUTANEOUS | 1 refills | Status: DC

## 2019-06-26 NOTE — Progress Notes (Addendum)
Abigail Figueroa  MRN: [id number] DOB: 1960/08/24  Subjective:  HPI   The patient is a 59 year old female who presents today for follow up of chronic health.  She was last seen for this in October 2020.  She has been seen since that time for acute visits.  She has not yet had all of her labs done that were ordered during her last visit.     Diabetes Mellitus Type II, Follow-up:   Lab Results  Component Value Date   HGBA1C 7.0 (H) 02/27/2019   HGBA1C 7.6 (H) 02/28/2017   HGBA1C 8.6 02/02/2016   Management since then includes Metformin 500 mg 2 tablets morning and evening with 1 tablet at lunch.. She reports good compliance with treatment. She is not having side effects.   Home blood sugar records: ranges 101-159  Episodes of hypoglycemia? no   Most Recent Eye Exam: greater than 1 year Weight trend:   Wt Readings from Last 3 Encounters:  06/26/19 218 lb (98.9 kg)  05/29/19 217 lb 3.2 oz (98.5 kg)  03/22/19 219 lb (99.3 kg)   ------------------------------------------------------------------------   Hypertension, follow-up:  BP Readings from Last 3 Encounters:  06/26/19 124/68  05/29/19 (!) 152/81  05/15/19 (!) 154/79    Management since that visit includes none.She reports good compliance with treatment. She is not having side effects.     Outside blood pressures are 120's over 80's.  Cardiovascular risk factors include diabetes mellitus and dyslipidemia.     Lipid/Cholesterol, Follow-up:   Last Lipid Panel:    Component Value Date/Time   CHOL 198 02/27/2019 0818   CHOL 289 (H) 01/10/2015 1009   CHOL 277 (H) 08/16/2014 0843   TRIG 112 02/27/2019 0818   TRIG 167 (H) 08/16/2014 0843   HDL 71 02/27/2019 0818   HDL 91 01/10/2015 1009   HDL 77 08/16/2014 0843   CHOLHDL 2.8 02/27/2019 0818   VLDL 22 02/27/2019 0818   VLDL 33 08/16/2014 0843   LDLCALC 105 (H) 02/27/2019 0818   LDLCALC 190 (H) 02/28/2017 1009   LDLCALC 167 (H) 08/16/2014 0843    She  reports good compliance with treatment. She is not having side effects.   Wt Readings from Last 3 Encounters:  06/26/19 218 lb (98.9 kg)  05/29/19 217 lb 3.2 oz (98.5 kg)  03/22/19 219 lb (99.3 kg)     Patient would also like for you to check her hands.  She states she is not sure if she is having problems due to the use of hand sanitizer or some other dermatology issue.  She has been having some skin breakdown.   ------------------------------------------------------------------------   Patient Active Problem List   Diagnosis Date Noted  . Morbid obesity due to excess calories ([place]) 02/06/2019  . Osteoarthrosis of hand 12/28/2017  . Special screening for malignant neoplasms, colon   . Rhinitis, allergic 06/18/2017  . Right-sided Bell's palsy 02/02/2016  . Spondylolisthesis of lumbar region 08/25/2015  . Type II diabetes mellitus ([place])   . Abnormal stress test   . Hypercholesterolemia   . Abnormal nuclear stress test 07/30/2015  . Preoperative clearance 07/22/2015  . Lower extremity edema 07/22/2015  . History of brain disorder 11/05/2014  . H/O: obesity 11/05/2014  . Depression, major, recurrent, moderate ([place]) 11/05/2014  . Difficulty with family 11/05/2014  . Feeling stressed out 11/05/2014  . H/O: HTN (hypertension) 11/05/2014  . H/O elevated lipids 11/05/2014  . Anxiety, generalized 11/05/2014  . Migraine headache 11/05/2014  .  Moderate recurrent major depression ([place]) 10/16/2014  . Generalized anxiety disorder 10/16/2014  . CHF (congestive heart failure) ([place]) 10/14/2014  . Edema 10/14/2014  . Depressive disorder 10/14/2014  . Obstructive sleep apnea 10/14/2014  . Diabetes ([place]) 10/14/2014  . Essential hypertension 10/14/2014  . Epilepsy ([place]) 10/14/2014  . Migraine 10/14/2014  . Hyperlipemia 10/14/2014  . Dysthymic disorder 10/14/2014  . Seizure ([place]) 10/14/2014  . Hypercholesteremia 10/14/2014  . Palpitation 10/14/2014  . Depression 10/14/2014  . Dyspepsia  10/14/2014  . Hematochezia 10/14/2014  . Urge incontinence 10/14/2014  . Hair thinning 10/14/2014  . Dermatitis 09/10/2014  . Acid indigestion 09/10/2014  . Alopecia 09/10/2014  . Blood in feces 09/10/2014  . H/O hypercholesterolemia 09/10/2014  . Arthralgia of hip 09/10/2014  . Feeling bilious 09/10/2014  . Awareness of heartbeats 09/10/2014  . Pain in the wrist 09/10/2014  . Convulsions ([place]) 09/10/2014  . Urge incontinence 09/10/2014  . Snapping thumb syndrome 04/19/2014  . Diabetes mellitus ([place]) 03/14/2014  . De Quervain's disease (radial styloid tenosynovitis) 03/08/2014  . LBP (low back pain) 03/07/2013  . Mixed hyperlipidemia 01/15/2013  . Encounter for long-term (current) use of other medications - statin 01/15/2013  . Fatigue 01/15/2013  . Heart palpation 01/15/2013  . Obesity (BMI 30-39.9) 01/02/2013  . CCF (congestive cardiac failure) ([place]) 11/13/2009  . Malaise and fatigue 02/17/2009  . Sprain and strain of interphalangeal (joint) of hand 10/23/2007  . HEADACHE 06/23/2007  . Diabetes mellitus type 2 in obese ([place]) 05/08/2007  . HYPERCALCEMIA 05/08/2007  . Sleep apnea 05/08/2007  . Edema 05/08/2007  . DIARRHEA, CHRONIC 05/08/2007  . Clinical depression 10/20/2006  . Obstructive apnea 03/22/2006  . Diabetes mellitus, type 2 ([place]) 11/12/2005  . Depression, neurotic 11/12/2005  . Epilepsy ([place]) 11/12/2005  . Combined fat and carbohydrate induced hyperlipemia 11/12/2005  . Acute onset aura migraine 11/12/2005    Past Medical History:  Diagnosis Date  . Abnormal stress test    a. 10/2009: Normal myocardial perfusion imaging; b. 08/2015 MV: medium defect of mod severity in mid ant apical region w/ mild HK of the distal inf wall;  c. 08/2015 Cath: nl Cors, EF 60%.  . Anemia   . Anxiety   . Arthritis   . Bell's palsy   . Depression   . Edema   . Essential hypertension   . Frequent urination   . Frequent urination at night   . Headache    migraines, gets botox  injections  . Heart palpitations June 2013   Event monitor showing sinus tachycardia, PACs with couplets and triplets.  Marland Kitchen Hx of echocardiogram    a. 10/2009 Echo: showed normal left ventricular function, mild left ventricular hypertrophy, and no significant valve abnormalities.  . Hypercholesterolemia   . Migraine   . Morbid obesity ([place])   . Neuromuscular disorder ([place])   . OSA (obstructive sleep apnea)    has been on continuous positive airway pressure  . Seizure disorder ([place])   . Seizures ([place])   . Type II diabetes mellitus ([place])    Past Surgical History:  Procedure Laterality Date  . ABDOMINAL HYSTERECTOMY  1990   without BSO  . BACK SURGERY  1900s 2001   x2 with "cage put in"  . BILATERAL SALPINGOOPHORECTOMY  1990  . CARDIAC CATHETERIZATION N/A 08/06/2015   Procedure: Left Heart Cath and Coronary Angiography;  Surgeon: Troy Sine, MD;  Location: [facility];  Service: Cardiovascular;  Laterality: N/A;  . CARPAL TUNNEL RELEASE Bilateral   .  CHOLECYSTECTOMY  1980  . COLONOSCOPY WITH PROPOFOL N/A 08/30/2017   Procedure: COLONOSCOPY WITH PROPOFOL;  Surgeon: Lin Landsman, MD;  Location: [facility] ENDOSCOPY;  Service: Endoscopy;  Laterality: N/A;  . DG THUMB LEFT HAND  01/24/2018   repair joint in left thumb  . DORSAL COMPARTMENT RELEASE Left 10/25/2014   Procedure: LEFT FIRST  DORSAL COMPARTMENT RELEASE AND RADIAL TENOSYNOVECTOMY ;  Surgeon: Roseanne Kaufman, MD;  Location: [facility];  Service: Orthopedics;  Laterality: Left;  . HAND SURGERY    . KNEE SURGERY Right 2012   meniscus tear  . KNEE SURGERY  2012  . LAMINECTOMY  1995  . TUBAL LIGATION  1982  . WRIST SURGERY Right    Family History  Problem Relation Age of Onset  . Sarcoidosis Mother   . Seizures Mother   . Heart attack Mother   . Alzheimer's disease Maternal Grandmother   . Dementia Maternal Grandmother   . Hypertension Maternal Grandfather   . Diabetes Maternal Grandfather   . Breast  cancer Neg Hx    Social History   Socioeconomic History  . Marital status: Married    Spouse name: Not on file  . Number of children: 2  . Years of education: Not on file  . Highest education level: 12th grade  Occupational History  . Occupation: disabled  Tobacco Use  . Smoking status: Former Smoker    Types: Cigarettes    Quit date: 01/03/1983    Years since quitting: 36.5  . Smokeless tobacco: Never Used  . Tobacco comment: quit in 1984  Substance and Sexual Activity  . Alcohol use: No    Alcohol/week: 0.0 standard drinks  . Drug use: No  . Sexual activity: Never  Other Topics Concern  . Not on file  Social History Narrative   ** Merged History Encounter **       She is a married mother of 2, grandmother 3. She tries to get exercise but is not doing any routine program.   She quit smoking over 25 years ago does not drink alcohol.   Social Determinants of Health   Financial Resource Strain:   . Difficulty of Paying Living Expenses: Not on file  Food Insecurity:   . Worried About Charity fundraiser in the Last Year: Not on file  . Ran Out of Food in the Last Year: Not on file  Transportation Needs:   . Lack of Transportation (Medical): Not on file  . Lack of Transportation (Non-Medical): Not on file  Physical Activity: Inactive  . Days of Exercise per Week: 0 days  . Minutes of Exercise per Session: 0 min  Stress:   . Feeling of Stress : Not on file  Social Connections: Unknown  . Frequency of Communication with Friends and Family: Patient refused  . Frequency of Social Gatherings with Friends and Family: Patient refused  . Attends Religious Services: Patient refused  . Active Member of Clubs or Organizations: Patient refused  . Attends Archivist Meetings: Patient refused  . Marital Status: Patient refused  Intimate Partner Violence: Unknown  . Fear of Current or Ex-Partner: Patient refused  . Emotionally Abused: Patient refused  . Physically  Abused: Patient refused  . Sexually Abused: Patient refused    Outpatient Encounter Medications as of 06/26/2019  Medication Sig  . AIMOVIG 140 MG/ML SOAJ Inject 140 mg into the skin every 30 (thirty) days.   Marland Kitchen amLODipine (NORVASC) 5 MG tablet Take 1.5 tablets (7.5  mg total) by mouth daily.  Marland Kitchen atorvastatin (LIPITOR) 40 MG tablet TAKE 1 TABLET BY MOUTH EVERY DAY (Patient taking differently: Take 40 mg by mouth daily. )  . baclofen (LIORESAL) 10 MG tablet Take 10 mg by mouth 2 (two) times daily as needed for muscle spasms.   Marland Kitchen BOTOX 100 UNITS SOLR injection Inject into the muscle every 3 (three) months.   . chlorproMAZINE (THORAZINE) 25 MG tablet Take 25 mg by mouth 3 (three) times daily as needed for nausea.   . fluocinonide ointment (LIDEX) 3.81 % Apply 1 application topically 2 (two) times daily.  . furosemide (LASIX) 20 MG tablet Take 1 tablet (20 mg total) by mouth daily. May take extra tab for weight gain  . lamoTRIgine (LAMICTAL) 100 MG tablet Take 100 mg by mouth 2 (two) times daily.  . Lancets (ONETOUCH ULTRASOFT) lancets Test fasting each morning and 2 hours before supper. Retest if having hypoglycemic symptoms.  Marland Kitchen levocetirizine (XYZAL) 5 MG tablet TAKE 1 TABLET BY MOUTH EVERY DAY IN THE EVENING (Patient taking differently: Take 5 mg by mouth every evening. TAKE 1 TABLET BY MOUTH EVERY DAY IN THE EVENING)  . metFORMIN (GLUCOPHAGE) 500 MG tablet TAKE 2 TABLETS BY MOUTH IN THE MORNING ,1 TABLET AT LUNCH, & TAKE 2 TABLETS IN THE EVENING (Patient taking differently: Take 500-1,000 mg by mouth See admin instructions. Take 2 tablets (1000mg ) by mouth every morning, 1 tablet (500mg ) by mouth at lunchtime and 2 tablets (1000mg ) by mouth every evening)  . metoprolol tartrate (LOPRESSOR) 100 MG tablet Take 1 tablet (100 mg total) by mouth 2 (two) times daily.  Glory Rosebush VERIO test strip TEST FASTING GLUCOSE DAILY AS DIRECTED (Patient taking differently: 1 each by Other route as directed. )  .  TEGRETOL-XR 400 MG 12 hr tablet TAKE 1 TABLET BY MOUTH TWICE A DAY (Patient taking differently: Take 400 mg by mouth 2 (two) times daily. )  . VIIBRYD 40 MG TABS TAKE 1 TABLET BY MOUTH EVERY DAY (Patient taking differently: Take 40 mg by mouth daily. )  . ezetimibe (ZETIA) 10 MG tablet Take 1 tablet (10 mg total) by mouth daily.   No facility-administered encounter medications on file as of 06/26/2019.    Allergies  Allergen Reactions  . Morphine Rash and Swelling  . Morphine And Related Hives    Nausea and vomiting  Nausea and vomiting  . Oxycodone Hives, Itching, Rash and Swelling  . Oxycodone Hcl Swelling, Hives and Itching  . Morphine Sulfate Itching and Nausea And Vomiting    REACTION: swelling    Review of Systems  Constitutional: Negative for chills, diaphoresis, fever and malaise/fatigue.  HENT: Negative for congestion, ear pain and sore throat.   Respiratory: Negative for cough and shortness of breath.   Cardiovascular: Negative for chest pain.  Gastrointestinal: Negative for abdominal pain.  Genitourinary: Positive for frequency.       Urge incontinence. Wears disposable incontinence pull-on underwear.  Musculoskeletal: Negative for myalgias.  Neurological: Positive for dizziness and headaches.    Objective:  BP 124/68 (BP Location: Right Arm, Patient Position: Sitting, Cuff Size: Large)   Pulse 63   Temp (!) 96.8 F (36 C) (Skin)   Wt 218 lb (98.9 kg)   LMP  (LMP Unknown)   SpO2 97%   BMI 38.62 kg/m   Physical Exam  Constitutional: She is oriented to person, place, and time and well-developed, well-nourished, and in no distress.  HENT:  Head: Normocephalic.  Eyes: Conjunctivae  are normal.  Cardiovascular: Normal rate.  Pulmonary/Chest: Effort normal and breath sounds normal.  Abdominal: Soft. Bowel sounds are normal.  Musculoskeletal:        General: Normal range of motion.     Cervical back: Neck supple.  Neurological: She is alert and oriented to  person, place, and time.  Skin: No rash noted.  Psychiatric: Mood, affect and judgment normal.    Assessment and Plan :   1. Eczema of both hands Recurrent dry skin of both hands with fissuring. May use Lidex BID for 1-2 weeks. Apply Eucerin or Baby Oil to skin after bath to decrease drying by evaporation. Drink extra fluids. - fluocinonide ointment (LIDEX) 0.05 %; Apply 1 application topically 2 (two) times daily.  Dispense: 30 g; Refill: 1  2. Morbid obesity due to excess calories (HCC) BMI over 38 today. Weight essentially the same over the past 4 months. Must reduce caloric intake, follow diabetic regimen and walk for exercise 30 minutes 3-4 times a week. Recheck routine labs. - CBC with Differential/Platelet - Comprehensive metabolic panel - Hemoglobin A1c - Lipid Panel With LDL/HDL Ratio - TSH  3. Type 2 diabetes mellitus without complication, without long-term current use of insulin (HCC) Still tolerating Metformin 500 mg 2 tablets each morning and evening with 1 tablet at lunch. Last Hgb A1C was 7.0% on 02-27-19. No hypoglycemic episodes, blurring of vision, polydipsia or peripheral neuropathy. Recheck routine labs. - CBC with Differential/Platelet - Comprehensive metabolic panel - Hemoglobin A1c - Lipid Panel With LDL/HDL Ratio  4. Hypercholesterolemia Tolerating Atorvastatin without side effect. Recheck labs to assess control. Encouraged to follow low fat diet, lose weight and walk for exercise. - Comprehensive metabolic panel - Lipid Panel With LDL/HDL Ratio - TSH  5. Depression, major, recurrent, moderate ([place]) Continues to take Viibryd with fair control of sadness and ability to concentrate. No suicidal ideation. Feels husband's attitude has been difficult recently with his chronic health issues. Recheck routine labs and continue present medications. - CBC with Differential/Platelet - Comprehensive metabolic panel - TSH  6. H/O: HTN (hypertension) Well controlled  with Amlodipine and Metoprolol. No dizziness, fatigue, chest pains or peripheral edema. Followed by cardiologist (Dr. Claiborne Billings). Recheck CBC, CMP. Lipid Panel and TSH. - CBC with Differential/Platelet - Comprehensive metabolic panel - Lipid Panel With LDL/HDL Ratio - TSH  7. Nonintractable epilepsy without status epilepticus, unspecified epilepsy type ([place]) No seizures in more than 10 years. Still taking Tegretol-XR and followed by Dr. Domingo Cocking (neurologist) for control. Recheck routine labs and follow up pending reports. - CBC with Differential/Platelet - Comprehensive metabolic panel  8. Chronic migraine without aura without status migrainosus, not intractable Followed by neurologist (Dr. Domingo Cocking) and gets routine Botox injections q 3 months with Aimovig injection once a month. Recheck routine labs. - CBC with Differential/Platelet - Comprehensive metabolic panel  9. Obstructive sleep apnea Still using CPAP at 11 cm H2O pressure each night. Followed by cardiologist (Dr. Claiborne Billings). Feels she is sleeping well with its use.  10. Mixed stress and urge urinary incontinence Still having urinary incontinence and leakage with stress or urge to urinate. Must wear disposable pull-on underwear to prevent catch any wetting. Changes pull-ons 2-3 times a day to prevent wetness causing skin breakdown, yeast or decubitus formation. Medically necessary to continue incontinence pull-ons.

## 2019-06-27 LAB — CBC WITH DIFFERENTIAL/PLATELET
Basophils Absolute: 0 10*3/uL (ref 0.0–0.2)
Basos: 1 %
EOS (ABSOLUTE): 0.2 10*3/uL (ref 0.0–0.4)
Eos: 5 %
Hematocrit: 35.3 % (ref 34.0–46.6)
Hemoglobin: 11.5 g/dL (ref 11.1–15.9)
Immature Grans (Abs): 0 10*3/uL (ref 0.0–0.1)
Immature Granulocytes: 0 %
Lymphocytes Absolute: 1.9 10*3/uL (ref 0.7–3.1)
Lymphs: 39 %
MCH: 27.6 pg (ref 26.6–33.0)
MCHC: 32.6 g/dL (ref 31.5–35.7)
MCV: 85 fL (ref 79–97)
Monocytes Absolute: 0.3 10*3/uL (ref 0.1–0.9)
Monocytes: 6 %
Neutrophils Absolute: 2.4 10*3/uL (ref 1.4–7.0)
Neutrophils: 49 %
Platelets: 224 10*3/uL (ref 150–450)
RBC: 4.17 x10E6/uL (ref 3.77–5.28)
RDW: 14.5 % (ref 11.7–15.4)
WBC: 4.9 10*3/uL (ref 3.4–10.8)

## 2019-06-27 LAB — COMPREHENSIVE METABOLIC PANEL
ALT: 19 IU/L (ref 0–32)
AST: 17 IU/L (ref 0–40)
Albumin/Globulin Ratio: 1.4 (ref 1.2–2.2)
Albumin: 4.1 g/dL (ref 3.8–4.9)
Alkaline Phosphatase: 79 IU/L (ref 39–117)
BUN/Creatinine Ratio: 14 (ref 9–23)
BUN: 16 mg/dL (ref 6–24)
Bilirubin Total: <0.2 mg/dL (ref 0.0–1.2)
CO2: 23 mmol/L (ref 20–29)
Calcium: 10.3 mg/dL — ABNORMAL HIGH (ref 8.7–10.2)
Chloride: 104 mmol/L (ref 96–106)
Creatinine, Ser: 1.16 mg/dL — ABNORMAL HIGH (ref 0.57–1.00)
GFR calc Af Amer: 60 mL/min/{1.73_m2} (ref >59)
GFR calc non Af Amer: 52 mL/min/{1.73_m2} — ABNORMAL LOW (ref >59)
Globulin, Total: 2.9 g/dL (ref 1.5–4.5)
Glucose: 134 mg/dL — ABNORMAL HIGH (ref 65–99)
Potassium: 4.6 mmol/L (ref 3.5–5.2)
Sodium: 140 mmol/L (ref 134–144)
Total Protein: 7 g/dL (ref 6.0–8.5)

## 2019-06-27 LAB — LIPID PANEL WITH LDL/HDL RATIO
Cholesterol, Total: 253 mg/dL — ABNORMAL HIGH (ref 100–199)
HDL: 81 mg/dL (ref >39)
LDL Chol Calc (NIH): 148 mg/dL — ABNORMAL HIGH (ref 0–99)
LDL/HDL Ratio: 1.8 ratio (ref 0.0–3.2)
Triglycerides: 140 mg/dL (ref 0–149)
VLDL Cholesterol Cal: 24 mg/dL (ref 5–40)

## 2019-06-27 LAB — HEMOGLOBIN A1C
Est. average glucose Bld gHb Est-mCnc: 160 mg/dL
Hgb A1c MFr Bld: 7.2 % — ABNORMAL HIGH (ref 4.8–5.6)

## 2019-06-27 LAB — TSH: TSH: 1.31 u[IU]/mL (ref 0.450–4.500)

## 2019-06-28 ENCOUNTER — Other Ambulatory Visit: Payer: Self-pay | Admitting: Family Medicine

## 2019-06-28 DIAGNOSIS — M542 Cervicalgia: Secondary | ICD-10-CM | POA: Diagnosis not present

## 2019-06-28 DIAGNOSIS — G518 Other disorders of facial nerve: Secondary | ICD-10-CM | POA: Diagnosis not present

## 2019-06-28 DIAGNOSIS — G43719 Chronic migraine without aura, intractable, without status migrainosus: Secondary | ICD-10-CM | POA: Diagnosis not present

## 2019-06-28 DIAGNOSIS — F331 Major depressive disorder, recurrent, moderate: Secondary | ICD-10-CM

## 2019-06-28 DIAGNOSIS — M791 Myalgia, unspecified site: Secondary | ICD-10-CM | POA: Diagnosis not present

## 2019-06-29 ENCOUNTER — Other Ambulatory Visit: Payer: Self-pay

## 2019-06-29 DIAGNOSIS — E782 Mixed hyperlipidemia: Secondary | ICD-10-CM

## 2019-06-29 MED ORDER — ATORVASTATIN CALCIUM 80 MG PO TABS: 80.0000 mg | ORAL_TABLET | Freq: Every day | ORAL | 3 refills | Status: DC

## 2019-07-02 ENCOUNTER — Encounter: Payer: Self-pay | Admitting: Family Medicine

## 2019-07-25 ENCOUNTER — Other Ambulatory Visit: Payer: Self-pay | Admitting: Family Medicine

## 2019-07-25 ENCOUNTER — Other Ambulatory Visit: Payer: Self-pay | Admitting: Cardiovascular Disease

## 2019-07-25 NOTE — Telephone Encounter (Signed)
Requested Prescriptions  Pending Prescriptions Disp Refills  . metFORMIN (GLUCOPHAGE) 500 MG tablet [Pharmacy Med Name: METFORMIN HCL 500 MG TABLET] 450 tablet 1    Sig: TAKE 2 TABLETS BY MOUTH IN THE MORNING ,1 TABLET AT LUNCH, & TAKE 2 TABLETS IN THE EVENING     Endocrinology:  Diabetes - Biguanides Failed - 07/25/2019  1:55 AM      Failed - Cr in normal range and within 360 days    Creat  Date Value Ref Range Status  02/28/2017 1.03 0.50 - 1.05 mg/dL Final    Comment:    For patients >49 years of age, the reference limit for Creatinine is approximately 13% higher for people identified as African-American. .    Creatinine, Ser  Date Value Ref Range Status  06/26/2019 1.16 (H) 0.57 - 1.00 mg/dL Final   Creatinine, POC  Date Value Ref Range Status  07/20/2016 NA mg/dL Final         Passed - HBA1C is between 0 and 7.9 and within 180 days    Hemoglobin A1C  Date Value Ref Range Status  08/16/2014 9.4 (H) % Final    Comment:    4.0-6.0 NOTE: New Reference Range  07/09/14    Hgb A1c MFr Bld  Date Value Ref Range Status  06/26/2019 7.2 (H) 4.8 - 5.6 % Final    Comment:             Prediabetes: 5.7 - 6.4          Diabetes: >6.4          Glycemic control for adults with diabetes: <7.0          Passed - eGFR in normal range and within 360 days    GFR, Est African American  Date Value Ref Range Status  02/28/2017 70 > OR = 60 mL/min/1.73m2 Final   GFR calc Af Amer  Date Value Ref Range Status  06/26/2019 60 >59 mL/min/1.73 Final   GFR, Est Non African American  Date Value Ref Range Status  02/28/2017 61 > OR = 60 mL/min/1.73m2 Final   GFR calc non Af Amer  Date Value Ref Range Status  06/26/2019 52 (L) >59 mL/min/1.73 Final         Passed - Valid encounter within last 6 months    Recent Outpatient Visits          4 weeks ago Morbid obesity due to excess calories (HCC)   [facility] Chrismon, Dennis E, PA   1 month ago Sciatica of left side   [facility] Fisher, Donald E, MD   2 months ago Breast pain, right   [facility] Pollak, Adriana M, PA-C   4 months ago Facial swelling   [facility] Chrismon, Dennis E, PA   5 months ago Type 2 diabetes mellitus with hyperosmolarity without coma, without long-term current use of insulin (HCC)   [facility] Chrismon, Dennis E, PA              

## 2019-07-31 ENCOUNTER — Encounter: Payer: Self-pay | Admitting: Family Medicine

## 2019-07-31 ENCOUNTER — Ambulatory Visit (INDEPENDENT_AMBULATORY_CARE_PROVIDER_SITE_OTHER): Payer: Medicare Other | Admitting: Family Medicine

## 2019-07-31 DIAGNOSIS — J301 Allergic rhinitis due to pollen: Secondary | ICD-10-CM | POA: Diagnosis not present

## 2019-07-31 MED ORDER — FLUTICASONE PROPIONATE 50 MCG/ACT NA SUSP: 2.0000 | Freq: Every day | NASAL | 6 refills | Status: DC

## 2019-07-31 NOTE — Progress Notes (Signed)
Virtual telephone visit      Virtual Visit via Telephone Note   This visit type was conducted due to national recommendations for restrictions regarding the COVID-19 Pandemic (e.g. social distancing) in an effort to limit this patient's exposure and mitigate transmission in our community. Due to her co-morbid illnesses, this patient is at least at moderate risk for complications without adequate follow up. This format is felt to be most appropriate for this patient at this time. The patient did not have access to video technology or had technical difficulties with video requiring transitioning to audio format only (telephone). Physical exam was limited to content and character of the telephone converstion.    Patient location: Home Provider location: Office    Patient: Abigail Figueroa   DOB: 06-06-1960   59 y.o. Female  MRN: [id number] Visit Date: 07/31/2019  Today's Provider: Vernie Murders, PA  Subjective:    Chief Complaint  Patient presents with  . URI   HPI Upper Respiratory Infection: Patient complains of symptoms of a URI. Symptoms include congestion with rhinorrhea and sneezing. No fever or sore throat. Notice dark circles under eyes with some maxillary pressure. Onset of symptoms was 3 day ago, unchanged since that time. She also c/o post nasal drip for the past 3 days .  She is drinking plenty of fluids. Evaluation to date: none. Treatment to date: antihistamines.      Past Medical History:  Diagnosis Date  . Abnormal stress test    a. 10/2009: Normal myocardial perfusion imaging; b. 08/2015 MV: medium defect of mod severity in mid ant apical region w/ mild HK of the distal inf wall;  c. 08/2015 Cath: nl Cors, EF 60%.  . Anemia   . Anxiety   . Arthritis   . Bell's palsy   . Depression   . Edema   . Essential hypertension   . Frequent urination   . Frequent urination at night   . Headache    migraines, gets botox injections  . Heart palpitations June 2013    Event monitor showing sinus tachycardia, PACs with couplets and triplets.  Marland Kitchen Hx of echocardiogram    a. 10/2009 Echo: showed normal left ventricular function, mild left ventricular hypertrophy, and no significant valve abnormalities.  . Hypercholesterolemia   . Migraine   . Morbid obesity ([place])   . Neuromuscular disorder ([place])   . OSA (obstructive sleep apnea)    has been on continuous positive airway pressure  . Seizure disorder ([place])   . Seizures ([place])   . Type II diabetes mellitus ([place])    Past Surgical History:  Procedure Laterality Date  . ABDOMINAL HYSTERECTOMY  1990   without BSO  . BACK SURGERY  1900s 2001   x2 with "cage put in"  . BILATERAL SALPINGOOPHORECTOMY  1990  . CARDIAC CATHETERIZATION N/A 08/06/2015   Procedure: Left Heart Cath and Coronary Angiography;  Surgeon: Troy Sine, MD;  Location: [facility];  Service: Cardiovascular;  Laterality: N/A;  . CARPAL TUNNEL RELEASE Bilateral   . CHOLECYSTECTOMY  1980  . COLONOSCOPY WITH PROPOFOL N/A 08/30/2017   Procedure: COLONOSCOPY WITH PROPOFOL;  Surgeon: Lin Landsman, MD;  Location: [facility];  Service: Endoscopy;  Laterality: N/A;  . DG THUMB LEFT HAND  01/24/2018   repair joint in left thumb  . DORSAL COMPARTMENT RELEASE Left 10/25/2014   Procedure: LEFT FIRST  DORSAL COMPARTMENT RELEASE AND RADIAL TENOSYNOVECTOMY ;  Surgeon: Roseanne Kaufman, MD;  Location: [facility];  Service: Orthopedics;  Laterality: Left;  . HAND SURGERY    . KNEE SURGERY Right 2012   meniscus tear  . KNEE SURGERY  2012  . LAMINECTOMY  1995  . TUBAL LIGATION  1982  . WRIST SURGERY Right    Social History   Tobacco Use  . Smoking status: Former Smoker    Types: Cigarettes    Quit date: 01/03/1983    Years since quitting: 36.5  . Smokeless tobacco: Never Used  . Tobacco comment: quit in 1984  Substance Use Topics  . Alcohol use: No    Alcohol/week: 0.0 standard drinks  . Drug use: No   Family History    Problem Relation Age of Onset  . Sarcoidosis Mother   . Seizures Mother   . Heart attack Mother   . Alzheimer's disease Maternal Grandmother   . Dementia Maternal Grandmother   . Hypertension Maternal Grandfather   . Diabetes Maternal Grandfather   . Breast cancer Neg Hx    Allergies  Allergen Reactions  . Morphine Rash and Swelling  . Morphine And Related Hives    Nausea and vomiting  Nausea and vomiting  . Oxycodone Hives, Itching, Rash and Swelling  . Oxycodone Hcl Swelling, Hives and Itching  . Morphine Sulfate Itching and Nausea And Vomiting    REACTION: swelling      Medications: Outpatient Medications Prior to Visit  Medication Sig  . Aimovig Inject 140 mg into the skin every 30 (thirty) days.   Marland Kitchen amLODipine Take 1.5 tablets (7.5 mg total) by mouth daily.  Marland Kitchen atorvastatin Take 1 tablet (80 mg total) by mouth daily.  . baclofen Take 10 mg by mouth 2 (two) times daily as needed for muscle spasms.   . Botox Inject into the muscle every 3 (three) months.   . chlorproMAZINE Take 25 mg by mouth 3 (three) times daily as needed for nausea.   Marland Kitchen ezetimibe Take 1 tablet (10 mg total) by mouth daily.  . fluocinonide ointment Apply 1 application topically 2 (two) times daily.  . furosemide Take 1 tablet (20 mg total) by mouth daily. May take extra tab for weight gain  . lamoTRIgine Take 100 mg by mouth 2 (two) times daily.  Marland Kitchen onetouch ultrasoft Test fasting each morning and 2 hours before supper. Retest if having hypoglycemic symptoms.  Marland Kitchen levocetirizine TAKE 1 TABLET BY MOUTH EVERY DAY IN THE EVENING (Patient taking differently: Take 5 mg by mouth every evening. TAKE 1 TABLET BY MOUTH EVERY DAY IN THE EVENING)  . metFORMIN TAKE 2 TABLETS BY MOUTH IN THE MORNING ,1 TABLET AT LUNCH, & TAKE 2 TABLETS IN THE EVENING  . metoprolol tartrate Take 1 tablet (100 mg total) by mouth 2 (two) times daily.  Glory Rosebush Verio TEST FASTING GLUCOSE DAILY AS DIRECTED (Patient taking differently: 1  each by Other route as directed. )  . TEGretol-XR TAKE 1 TABLET BY MOUTH TWICE A DAY (Patient taking differently: Take 400 mg by mouth 2 (two) times daily. )  . Viibryd TAKE 1 TABLET BY MOUTH EVERY DAY   No facility-administered medications prior to visit.    Review of Systems      Objective:    LMP  (LMP Unknown)    Physical: No respiratory distress during telephonic interview.     Assessment & Plan:    1. Seasonal allergic rhinitis due to pollen Has had rhinorrhea, slight headache, sneezing, PND, dark circles under eyes and occasional cough. Denies  fever or sore throat. Still using Xyzal 5 mg qd with mild symptom control. Denies COVID exposure. Should add Flonase Nasal Spray and monitor for fever or other COVID symptoms. Increase fluid intake and recheck prn. - fluticasone (FLONASE) 50 MCG/ACT nasal spray; Place 2 sprays into both nostrils daily.  Dispense: 16 g; Refill: 6     I discussed the assessment and treatment plan with the patient. The patient was provided an opportunity to ask questions and all were answered. The patient agreed with the plan and demonstrated an understanding of the instructions.   The patient was advised to call back or seek an in-person evaluation if the symptoms worsen or if the condition fails to improve as anticipated.  I provided 15 minutes of non-face-to-face time during this encounter.   Vernie Murders, [place] [phone] (phone) [phone] (fax)  [place]

## 2019-08-07 ENCOUNTER — Telehealth: Payer: Self-pay

## 2019-08-07 ENCOUNTER — Other Ambulatory Visit: Payer: Self-pay | Admitting: Cardiovascular Disease

## 2019-08-07 ENCOUNTER — Telehealth (INDEPENDENT_AMBULATORY_CARE_PROVIDER_SITE_OTHER): Payer: Medicare Other | Admitting: Physician Assistant

## 2019-08-07 DIAGNOSIS — M791 Myalgia, unspecified site: Secondary | ICD-10-CM | POA: Diagnosis not present

## 2019-08-07 DIAGNOSIS — Z5329 Procedure and treatment not carried out because of patient's decision for other reasons: Secondary | ICD-10-CM

## 2019-08-07 DIAGNOSIS — M542 Cervicalgia: Secondary | ICD-10-CM | POA: Diagnosis not present

## 2019-08-07 DIAGNOSIS — G43719 Chronic migraine without aura, intractable, without status migrainosus: Secondary | ICD-10-CM | POA: Diagnosis not present

## 2019-08-07 NOTE — Telephone Encounter (Signed)
Attempted to contact patient to get scheduled for virtual visit x4 times, called 20 minutes after scheduled appointment and advised that we would reschedule her.   Patient was given call back number if questions or concerns.

## 2019-08-09 NOTE — Progress Notes (Signed)
Unable to reach the patient for virtual visit

## 2019-08-10 ENCOUNTER — Ambulatory Visit: Payer: Self-pay

## 2019-08-10 ENCOUNTER — Telehealth: Payer: Self-pay | Admitting: Family Medicine

## 2019-08-10 ENCOUNTER — Other Ambulatory Visit: Payer: Self-pay | Admitting: Family Medicine

## 2019-08-10 DIAGNOSIS — T783XXA Angioneurotic edema, initial encounter: Secondary | ICD-10-CM | POA: Diagnosis not present

## 2019-08-10 DIAGNOSIS — Z91018 Allergy to other foods: Secondary | ICD-10-CM

## 2019-08-10 MED ORDER — EPINEPHRINE 0.3 MG/0.3ML IJ SOAJ: 0.3000 mg | INTRAMUSCULAR | 0 refills | Status: AC | PRN

## 2019-08-10 NOTE — Telephone Encounter (Signed)
See previous message, I spoke with [facility] nurse about patient and her condition and advised she been seen at urgent care today for reaction since there is no available appointments . KW

## 2019-08-10 NOTE — Telephone Encounter (Signed)
Pt 's husband called saying his wife's lip is swollen  They think for because of an allergic reaction to nuts.  Abigail Figueroa has treated her before for this.  She is out of her epipen although they have been using benadryl and it has helped today.  No swelling of the throat and no wheezing/  They use [facility] [address] and would like to get a refill

## 2019-08-10 NOTE — Telephone Encounter (Signed)
Patient called stating that she has a peanut allergy.  She states that her lips are very swollen especially her top lip.  She has taken benadryl for the swelling.  She states that she has not eaten anything unusual.  She had Chick filet on Wednesday.  She has recently had a trigger injection for her migraines. Her husband states that the last time she had the trigger injection her lips became swollen.  She states she does not itch now.  She states before taking benadryl her face was itching.She can swallow.  Her tongue is not swollen. Per protocol patient will go to ER or UC for evaluation. Care advice read to patient she verbalized understanding.   Reason for Disposition . [1] Severe swelling AND [2] cause unknown  Answer Assessment - Initial Assessment Questions 1. ONSET: "When did the swelling start?" (e.g., minutes, hours, days)     Top lip swollen  2. SEVERITY: "How swollen is it?"    Really swollen since 10 am this morning 3. ITCHING: "Is there any itching?" If so, ask: "How much?"   (Scale 1-10; mild, moderate or severe)     None taking benadryl 4. PAIN: "Is the swelling painful to touch?" If so, ask: "How painful is it?"   (Scale 1-10; mild, moderate or severe)    Sore from swelling 5. CAUSE: "What do you think is causing the lip swelling?"     Nuts allergy or triger shot for migrains 6. RECURRENT SYMPTOM: "Have you had lip swelling before?" If so, ask: "When was the last time?" "What happened that time?"     Usually from nuts but last time she had had triger shots 7. OTHER SYMPTOMS: "Do you have any other symptoms?" (e.g., toothache)    no 8. PREGNANCY: "Is there any chance you are pregnant?" "When was your last menstrual period?"    no  Protocols used: [facility]

## 2019-08-10 NOTE — Telephone Encounter (Signed)
Spoke with [facility] nurse when patient had called in and advised to be seen in Urgent care  for reaction to be assessed, please review with provider in regards to request for Epipen  . KW

## 2019-08-10 NOTE — Telephone Encounter (Signed)
Sent refill of EpiPen for allergy reactions. Should be seen at the ER or an Urgent Care Clinic if she needs to use the EpiPen.

## 2019-08-10 NOTE — Progress Notes (Signed)
Epipen injector refill for allergies to nuts.

## 2019-08-29 ENCOUNTER — Telehealth: Payer: Self-pay | Admitting: Family Medicine

## 2019-08-29 NOTE — Telephone Encounter (Signed)
Pt has run out of disposable underwear and needs Abigail Figueroa to call [facility] or give her a Rx for them/ please advise

## 2019-08-30 ENCOUNTER — Telehealth (INDEPENDENT_AMBULATORY_CARE_PROVIDER_SITE_OTHER): Payer: Medicare Other | Admitting: Adult Health

## 2019-08-30 ENCOUNTER — Encounter: Payer: Self-pay | Admitting: Adult Health

## 2019-08-30 VITALS — Temp 98.2°F | Wt 212.0 lb

## 2019-08-30 DIAGNOSIS — T7840XA Allergy, unspecified, initial encounter: Secondary | ICD-10-CM | POA: Insufficient documentation

## 2019-08-30 DIAGNOSIS — T7840XS Allergy, unspecified, sequela: Secondary | ICD-10-CM | POA: Diagnosis not present

## 2019-08-30 DIAGNOSIS — T783XXA Angioneurotic edema, initial encounter: Secondary | ICD-10-CM | POA: Insufficient documentation

## 2019-08-30 DIAGNOSIS — T783XXS Angioneurotic edema, sequela: Secondary | ICD-10-CM

## 2019-08-30 MED ORDER — PREDNISONE 10 MG PO TABS: ORAL_TABLET | ORAL | 0 refills | Status: AC

## 2019-08-30 MED ORDER — EPINEPHRINE 0.3 MG/0.3ML IJ SOAJ: 0.3000 mg | INTRAMUSCULAR | 1 refills | Status: DC | PRN

## 2019-08-30 NOTE — Telephone Encounter (Signed)
Send order to [employer] for disposable underwear for urge incontinence. Uses 2-3 underwear daily to prevent skin breakdown or decubitus ulcers.

## 2019-08-30 NOTE — Progress Notes (Addendum)
MyChart Video Visit    Virtual Visit via Video Note   This visit type was conducted due to national recommendations for restrictions regarding the COVID-19 Pandemic (e.g. social distancing) in an effort to limit this patient's exposure and mitigate transmission in our community. This patient is at least at moderate risk for complications without adequate follow up. This format is felt to be most appropriate for this patient at this time. Physical exam was limited by quality of the video and audio technology used for the visit.   Patient location:at home  Provider location: Provider: Provider's office at  [facility], [place].      Patient: Abigail Figueroa   DOB: 02-23-1961   59 y.o. Female  MRN: [id number] Visit Date: 08/30/2019  Today's healthcare provider: Marcille Buffy, FNP   No chief complaint on file.  Subjective    HPI  Patient presents today with complaint of swelling of her bottom lip, patient states that swelling was sudden and trigger is unknown. Patient denies any changes to food, detergent, soap(s), skin care products. Patient reports that she had swelling of the upper lip three weeks ago and was seen at [facility] and states that doctors believed she was suffering from a peanut allergy and prescribed her prednisone. Patient states that she completed  Prednisone at that time and it swelling resolved.   yesterday evening 08/29/2019 she had lip swelling. She reports her mouth felt funny before she had lower lip swollen. She had her migraine injection  Aimovig 140mg  /ml ( started November 2020) two days ago. She has had diarrehea x 3 yesterday and once today. Occasional headache today.  Denies any trouble swallowing.  She has not taken any medication for allergic reaction since onset.   Patient  denies any fever, body aches,chills, rash, chest pain, shortness of breath, nausea, vomiting, or diarrhea.   She notes that she feels this  is only since she started taking her migraine. Dr. Domingo Cocking at the headache clinic. She feels she started them at the end of last year/ beggining of the year. She takes them once a month.    On 08/09/2019 she had lip swelling and was seen in the urgent care. she had taken her migraine injection at that time. She thought she had nut allergy at that time. [name] PA- C sent her in an [place].   She denies any other medications or changes. She does not think she had nuts yesterday.      Patient Active Problem List   Diagnosis Date Noted  . Morbid obesity due to excess calories ([place]) 02/06/2019  . Osteoarthrosis of hand 12/28/2017  . Special screening for malignant neoplasms, colon   . Rhinitis, allergic 06/18/2017  . Right-sided Bell's palsy 02/02/2016  . Spondylolisthesis of lumbar region 08/25/2015  . Type II diabetes mellitus ([place])   . Abnormal stress test   . Hypercholesterolemia   . Abnormal nuclear stress test 07/30/2015  . Preoperative clearance 07/22/2015  . Lower extremity edema 07/22/2015  . History of brain disorder 11/05/2014  . H/O: obesity 11/05/2014  . Depression, major, recurrent, moderate ([place]) 11/05/2014  . Difficulty with family 11/05/2014  . Feeling stressed out 11/05/2014  . H/O: HTN (hypertension) 11/05/2014  . H/O elevated lipids 11/05/2014  . Anxiety, generalized 11/05/2014  . Migraine headache 11/05/2014  . Moderate recurrent major depression ([place]) 10/16/2014  . Generalized anxiety disorder 10/16/2014  . Edema 10/14/2014  . Depressive disorder 10/14/2014  . Obstructive  sleep apnea 10/14/2014  . Diabetes ([place]) 10/14/2014  . Essential hypertension 10/14/2014  . Epilepsy ([place]) 10/14/2014  . Migraine 10/14/2014  . Hyperlipemia 10/14/2014  . Dysthymic disorder 10/14/2014  . Seizure ([place]) 10/14/2014  . Hypercholesteremia 10/14/2014  . Palpitation 10/14/2014  . Depression 10/14/2014  . Dyspepsia 10/14/2014  . Hematochezia 10/14/2014  . Urge  incontinence 10/14/2014  . Hair thinning 10/14/2014  . Dermatitis 09/10/2014  . Acid indigestion 09/10/2014  . Alopecia 09/10/2014  . Blood in feces 09/10/2014  . H/O hypercholesterolemia 09/10/2014  . Arthralgia of hip 09/10/2014  . Feeling bilious 09/10/2014  . Awareness of heartbeats 09/10/2014  . Pain in the wrist 09/10/2014  . Convulsions ([place]) 09/10/2014  . Urge incontinence 09/10/2014  . Snapping thumb syndrome 04/19/2014  . Diabetes mellitus ([place]) 03/14/2014  . De Quervain's disease (radial styloid tenosynovitis) 03/08/2014  . LBP (low back pain) 03/07/2013  . Mixed hyperlipidemia 01/15/2013  . Encounter for long-term (current) use of other medications - statin 01/15/2013  . Fatigue 01/15/2013  . Heart palpation 01/15/2013  . Obesity (BMI 30-39.9) 01/02/2013  . CCF (congestive cardiac failure) ([place]) 11/13/2009  . Malaise and fatigue 02/17/2009  . Sprain and strain of interphalangeal (joint) of hand 10/23/2007  . HEADACHE 06/23/2007  . Diabetes mellitus type 2 in obese ([place]) 05/08/2007  . HYPERCALCEMIA 05/08/2007  . Sleep apnea 05/08/2007  . Edema 05/08/2007  . DIARRHEA, CHRONIC 05/08/2007  . Clinical depression 10/20/2006  . Obstructive apnea 03/22/2006  . Diabetes mellitus, type 2 ([place]) 11/12/2005  . Depression, neurotic 11/12/2005  . Epilepsy ([place]) 11/12/2005  . Combined fat and carbohydrate induced hyperlipemia 11/12/2005  . Acute onset aura migraine 11/12/2005   Past Medical History:  Diagnosis Date  . Abnormal stress test    a. 10/2009: Normal myocardial perfusion imaging; b. 08/2015 MV: medium defect of mod severity in mid ant apical region w/ mild HK of the distal inf wall;  c. 08/2015 Cath: nl Cors, EF 60%.  . Anemia   . Anxiety   . Arthritis   . Bell's palsy   . Depression   . Edema   . Essential hypertension   . Frequent urination   . Frequent urination at night   . Headache    migraines, gets botox injections  . Heart palpitations June 2013    Event monitor showing sinus tachycardia, PACs with couplets and triplets.  Marland Kitchen Hx of echocardiogram    a. 10/2009 Echo: showed normal left ventricular function, mild left ventricular hypertrophy, and no significant valve abnormalities.  . Hypercholesterolemia   . Migraine   . Morbid obesity ([place])   . Neuromuscular disorder ([place])   . OSA (obstructive sleep apnea)    has been on continuous positive airway pressure  . Seizure disorder ([place])   . Seizures ([place])   . Type II diabetes mellitus ([facility])    Allergies  Allergen Reactions  . Morphine Rash and Swelling  . Morphine And Related Hives    Nausea and vomiting  Nausea and vomiting  . Oxycodone Hives, Itching, Rash and Swelling  . Oxycodone Hcl Swelling, Hives and Itching  . Morphine Sulfate Itching and Nausea And Vomiting    REACTION: swelling      Medications: Outpatient Medications Prior to Visit  Medication Sig  . AIMOVIG 140 MG/ML SOAJ Inject 140 mg into the skin every 30 (thirty) days.   Marland Kitchen amLODipine (NORVASC) 5 MG tablet Take 1.5 tablets (7.5 mg total) by mouth daily.  Marland Kitchen atorvastatin (LIPITOR) 80 MG  tablet Take 1 tablet (80 mg total) by mouth daily.  . baclofen (LIORESAL) 10 MG tablet Take 10 mg by mouth 2 (two) times daily as needed for muscle spasms.   Marland Kitchen BOTOX 100 UNITS SOLR injection Inject into the muscle every 3 (three) months.   . chlorproMAZINE (THORAZINE) 25 MG tablet Take 25 mg by mouth 3 (three) times daily as needed for nausea.   . fluocinonide ointment (LIDEX) 6.96 % Apply 1 application topically 2 (two) times daily.  . fluticasone (FLONASE) 50 MCG/ACT nasal spray Place 2 sprays into both nostrils daily.  . furosemide (LASIX) 20 MG tablet TAKE 1 TABLET BY MOUTH EVERY DAY  . lamoTRIgine (LAMICTAL) 100 MG tablet Take 100 mg by mouth 2 (two) times daily.  . Lancets (ONETOUCH ULTRASOFT) lancets Test fasting each morning and 2 hours before supper. Retest if having hypoglycemic symptoms.  Marland Kitchen levocetirizine (XYZAL) 5 MG  tablet TAKE 1 TABLET BY MOUTH EVERY DAY IN THE EVENING (Patient taking differently: Take 5 mg by mouth every evening. TAKE 1 TABLET BY MOUTH EVERY DAY IN THE EVENING)  . metFORMIN (GLUCOPHAGE) 500 MG tablet TAKE 2 TABLETS BY MOUTH IN THE MORNING ,1 TABLET AT LUNCH, & TAKE 2 TABLETS IN THE EVENING  . metoprolol tartrate (LOPRESSOR) 100 MG tablet Take 1 tablet (100 mg total) by mouth 2 (two) times daily.  Glory Rosebush VERIO test strip TEST FASTING GLUCOSE DAILY AS DIRECTED (Patient taking differently: 1 each by Other route as directed. )  . TEGRETOL-XR 400 MG 12 hr tablet TAKE 1 TABLET BY MOUTH TWICE A DAY (Patient taking differently: Take 400 mg by mouth 2 (two) times daily. )  . VIIBRYD 40 MG TABS TAKE 1 TABLET BY MOUTH EVERY DAY  . ezetimibe (ZETIA) 10 MG tablet Take 1 tablet (10 mg total) by mouth daily.   No facility-administered medications prior to visit.    Last CBC Lab Results  Component Value Date   WBC 4.9 06/26/2019   HGB 11.5 06/26/2019   HCT 35.3 06/26/2019   MCV 85 06/26/2019   MCH 27.6 06/26/2019   RDW 14.5 06/26/2019   PLT 224 78/93/8101   Last metabolic panel Lab Results  Component Value Date   GLUCOSE 134 (H) 06/26/2019   NA 140 06/26/2019   K 4.6 06/26/2019   CL 104 06/26/2019   CO2 23 06/26/2019   BUN 16 06/26/2019   CREATININE 1.16 (H) 06/26/2019   GFRNONAA 52 (L) 06/26/2019   GFRAA 60 06/26/2019   CALCIUM 10.3 (H) 06/26/2019   PHOS 3.1 08/22/2015   PROT 7.0 06/26/2019   ALBUMIN 4.1 06/26/2019   LABGLOB 2.9 06/26/2019   AGRATIO 1.4 06/26/2019   BILITOT <0.2 06/26/2019   ALKPHOS 79 06/26/2019   AST 17 06/26/2019   ALT 19 06/26/2019   ANIONGAP 10 02/27/2019      Review of Systems  Constitutional: Positive for fatigue. Negative for activity change, appetite change, chills, diaphoresis, fever and unexpected weight change.  HENT: Negative.   Respiratory: Negative.   Cardiovascular: Negative for chest pain, palpitations and leg swelling.       She did  note fast heart rate with reaction denies any pain.   Gastrointestinal: Positive for diarrhea and vomiting. Negative for abdominal distention, abdominal pain, anal bleeding, blood in stool, constipation, nausea and rectal pain.  Musculoskeletal: Negative.   Skin: Negative.   Allergic/Immunologic: Positive for environmental allergies and food allergies.  Neurological: Positive for headaches. Negative for dizziness, weakness and light-headedness.  Psychiatric/Behavioral: Negative.  Objective    Temp 98.2 F (36.8 C) (Oral)   Wt 212 lb (96.2 kg)   LMP  (LMP Unknown)   BMI 37.55 kg/m    Physical Exam    Patient is alert and oriented and responsive to questions Engages in conversation with provider. Speaks in full sentences without any pauses without any shortness of breath or distress.  Video/ audio visit with patient via [place].   Angioedema of lips, photo below taken with patient's verbal consent into Epic for medical record purposes. This was a video/ audio visit.   Face swollen " mildly " per patient.  Media Information   Document Information  Photos  Allergic reaction bon 4/28   08/30/2019 14:58  Attached To:  Video Visit on 08/30/19 with Sota Hetz, Kelby Aline, FNP  Source Information  Dossie Swor, Kelby Aline, FNP  [facility]       Assessment & Plan     Angioedema of lips, sequela  Allergic reaction, sequela  She is currently on xyzal and will continue.  Discussed liquid Benadryl keeping it on hand and dosage of 25 mg  to 50 mg every 8 hours as needed and seeking care. Will cause drowsiness, no driving take caution.   She has an epi- pen  At home and reports she is aware how to use and to Call 911 if needed.  Discussed instructions in case needed.  Keep with her at all times. Signs of Anaphylaxis reviewed.  Meds ordered this encounter  Medications  . EPINEPHrine 0.3 mg/0.3 mL IJ SOAJ injection    Sig: Inject 0.3 mLs (0.3 mg total) into the  muscle as needed for up to 1 dose for anaphylaxis (Call 911 if used.).    Dispense:  1 each    Refill:  1  . predniSONE (DELTASONE) 10 MG tablet    Sig: Take 6 tablets (60 mg total) by mouth daily with breakfast for 2 days, THEN 5 tablets (50 mg total) daily with breakfast for 2 days, THEN 4 tablets (40 mg total) daily with breakfast for 2 days, THEN 3 tablets (30 mg total) daily with breakfast for 2 days, THEN 2 tablets (20 mg total) daily with breakfast for 2 days, THEN 1 tablet (10 mg total) daily with breakfast for 2 days.    Dispense:  42 tablet    Refill:  0    Questionable trigger, to nuts/ food or Migraine medication she took two days ago, she does recall she had taken it prior to last reaction as well. Will discontinue it she is advised to follow up with migraine clinic as well for migraines and to inform of possible reaction.   Medications Discontinued During This Encounter  Medication Reason  . AIMOVIG 140 MG/ML SOAJ Discontinued by provider    Orders Placed This Encounter  Procedures  . Ambulatory referral to Allergy    Referral Priority:   Urgent    Referral Type:   Allergy Testing    Referral Reason:   Specialty Services Required    Requested Specialty:   Allergy    Number of Visits Requested:   1  allergy referral is placed.    Advised patient call the office or your primary care doctor for an appointment if no improvement within 72 hours or if any symptoms change or worsen at any time  Advised ER or urgent Care if after hours or on weekend. Call 911 for emergency symptoms at any time.Patinet verbalized understanding of all instructions given/reviewed and treatment  plan and has no further questions or concerns at this time.      I discussed the assessment and treatment plan with the patient. The patient was provided an opportunity to ask questions and all were answered. The patient agreed with the plan and demonstrated an understanding of the instructions.   The patient  was advised to call back or seek an in-person evaluation if the symptoms worsen or if the condition fails to improve as anticipated.  I provided 35 minutes of non-face-to-face time during this encounter.     I[place]ellington [place] Keniesha Adderly, FNP, have reviewed all documentation for this visit. The documentation on 08/30/19 for the exam, diagnosis, procedures, and orders are all accurate and complete.  Marcille Buffy, [place] [phone] (phone) [phone] (fax)  [place]

## 2019-08-30 NOTE — Telephone Encounter (Signed)
Order written and placed on your desk to sign.

## 2019-09-02 ENCOUNTER — Other Ambulatory Visit: Payer: Self-pay | Admitting: Cardiovascular Disease

## 2019-09-18 ENCOUNTER — Other Ambulatory Visit: Payer: Self-pay | Admitting: Cardiovascular Disease

## 2019-09-20 ENCOUNTER — Other Ambulatory Visit: Payer: Self-pay | Admitting: Cardiovascular Disease

## 2019-09-27 DIAGNOSIS — G518 Other disorders of facial nerve: Secondary | ICD-10-CM | POA: Diagnosis not present

## 2019-09-27 DIAGNOSIS — M542 Cervicalgia: Secondary | ICD-10-CM | POA: Diagnosis not present

## 2019-09-27 DIAGNOSIS — M791 Myalgia, unspecified site: Secondary | ICD-10-CM | POA: Diagnosis not present

## 2019-09-27 DIAGNOSIS — G43719 Chronic migraine without aura, intractable, without status migrainosus: Secondary | ICD-10-CM | POA: Diagnosis not present

## 2019-10-11 ENCOUNTER — Encounter: Payer: Self-pay | Admitting: Allergy & Immunology

## 2019-10-11 ENCOUNTER — Ambulatory Visit (INDEPENDENT_AMBULATORY_CARE_PROVIDER_SITE_OTHER): Payer: Medicare Other | Admitting: Allergy & Immunology

## 2019-10-11 ENCOUNTER — Other Ambulatory Visit: Payer: Self-pay

## 2019-10-11 VITALS — BP 155/78 | HR 78 | Resp 18 | Ht 62.3 in | Wt 214.0 lb

## 2019-10-11 DIAGNOSIS — T783XXD Angioneurotic edema, subsequent encounter: Secondary | ICD-10-CM | POA: Diagnosis not present

## 2019-10-11 DIAGNOSIS — J302 Other seasonal allergic rhinitis: Secondary | ICD-10-CM

## 2019-10-11 DIAGNOSIS — J3089 Other allergic rhinitis: Secondary | ICD-10-CM

## 2019-10-11 MED ORDER — LEVOCETIRIZINE DIHYDROCHLORIDE 5 MG PO TABS: ORAL_TABLET | ORAL | 5 refills | Status: DC

## 2019-10-11 NOTE — Progress Notes (Signed)
NEW PATIENT  Date of Service/Encounter:  10/11/19  Referring provider: Margo Common, PA   Assessment:   Angioedema - with negative testing to the most common foods  Seasonal and perennial allergic rhinitis (ragweeds, trees, outdoor molds, cat, dust mite) - without any clear clinical reactivity in the form of allergic rhinitis  Plan/Recommendations:   1. Angioedema with urticaria/itching - Testing was positive to red cedar (winter blooming tree) and dog as well as ragweed, trees, outdoor molds, cat, and dust mite. - Copy of results provided. - Avoidance measures provided. - The dog might be related to these episodes, but I am not sure. - Dust mite is also known to do this.  - Regardless, we are going to increase your Zyrtec (cetirizine) to one tablet twice daily to suppress these episodes. - We are going to get some labs to look for serious causes of hives/swelling. - We will call you in 1-2 weeks with the results of the testing.  - Allergy shots are a possibility, although I do not think we are at that point right now.   2. Return in about 6 weeks (around 11/22/2019). This can be an in-person, a virtual Webex or a telephone follow up visit.  Subjective:   Abigail Figueroa is a 59 y.o. female presenting today for evaluation of No chief complaint on file.   Abigail Figueroa has a history of the following: Patient Active Problem List   Diagnosis Date Noted  . Seasonal and perennial allergic rhinitis 10/11/2019  . Angio-edema 08/30/2019  . Allergic reaction 08/30/2019  . Morbid obesity due to excess calories ([place]) 02/06/2019  . Osteoarthrosis of hand 12/28/2017  . Special screening for malignant neoplasms, colon   . Rhinitis, allergic 06/18/2017  . Right-sided Bell's palsy 02/02/2016  . Spondylolisthesis of lumbar region 08/25/2015  . Type II diabetes mellitus ([place])   . Abnormal stress test   . Hypercholesterolemia   . Abnormal nuclear stress test 07/30/2015  .  Preoperative clearance 07/22/2015  . Lower extremity edema 07/22/2015  . History of brain disorder 11/05/2014  . H/O: obesity 11/05/2014  . Depression, major, recurrent, moderate ([place]) 11/05/2014  . Difficulty with family 11/05/2014  . Feeling stressed out 11/05/2014  . H/O: HTN (hypertension) 11/05/2014  . H/O elevated lipids 11/05/2014  . Anxiety, generalized 11/05/2014  . Migraine headache 11/05/2014  . Moderate recurrent major depression ([place]) 10/16/2014  . Generalized anxiety disorder 10/16/2014  . Edema 10/14/2014  . Depressive disorder 10/14/2014  . Obstructive sleep apnea 10/14/2014  . Diabetes ([place]) 10/14/2014  . Essential hypertension 10/14/2014  . Epilepsy ([place]) 10/14/2014  . Migraine 10/14/2014  . Hyperlipemia 10/14/2014  . Dysthymic disorder 10/14/2014  . Seizure ([place]) 10/14/2014  . Hypercholesteremia 10/14/2014  . Palpitation 10/14/2014  . Depression 10/14/2014  . Dyspepsia 10/14/2014  . Hematochezia 10/14/2014  . Urge incontinence 10/14/2014  . Hair thinning 10/14/2014  . Dermatitis 09/10/2014  . Acid indigestion 09/10/2014  . Alopecia 09/10/2014  . Blood in feces 09/10/2014  . H/O hypercholesterolemia 09/10/2014  . Arthralgia of hip 09/10/2014  . Feeling bilious 09/10/2014  . Awareness of heartbeats 09/10/2014  . Pain in the wrist 09/10/2014  . Convulsions ([place]) 09/10/2014  . Urge incontinence 09/10/2014  . Snapping thumb syndrome 04/19/2014  . Diabetes mellitus ([place]) 03/14/2014  . De Quervain's disease (radial styloid tenosynovitis) 03/08/2014  . LBP (low back pain) 03/07/2013  . Mixed hyperlipidemia 01/15/2013  . Encounter for long-term (current) use of other medications - statin 01/15/2013  .  Fatigue 01/15/2013  . Heart palpation 01/15/2013  . Obesity (BMI 30-39.9) 01/02/2013  . CCF (congestive cardiac failure) ([place]) 11/13/2009  . Malaise and fatigue 02/17/2009  . Sprain and strain of interphalangeal (joint) of hand 10/23/2007  . HEADACHE  06/23/2007  . Diabetes mellitus type 2 in obese ([place]) 05/08/2007  . HYPERCALCEMIA 05/08/2007  . Sleep apnea 05/08/2007  . Edema 05/08/2007  . DIARRHEA, CHRONIC 05/08/2007  . Clinical depression 10/20/2006  . Obstructive apnea 03/22/2006  . Diabetes mellitus, type 2 ([place]) 11/12/2005  . Depression, neurotic 11/12/2005  . Epilepsy ([place]) 11/12/2005  . Combined fat and carbohydrate induced hyperlipemia 11/12/2005  . Acute onset aura migraine 11/12/2005    History obtained from: chart review and patient.  Abigail Figueroa was referred by Margo Common, PA.     Abigail Figueroa is a 59 y.o. female presenting for an evaluation of angioedema and urticaria.  She has had 6-7 episodes of swelling. She denies any hives with this. Her first episode was during the winter. She reports that her tongue started to swell. She went to the ED and had steroids as well as famotidine. Her face was completely normal when she left the emergency room. A C4 was elevated and C1 esterase was normal. She thought oit was pecans but her daughter that it was Amovig. She stopped eating the nuts. She started the Amovig 18 months ago. They filled it but she stopped taking it. This is a once monthly injection.   For all of these other reactions, she took Benadryl and managed these at home. She never needed to go to the emergency room. She does report that she thinks consults have been to do with one of the episodes.  She is on metoprolol for her blood pressure medication. She has been on this for years. She has not been on any ACE inhibitors to her knowledge.   She does have a history of environmental allergies, but these do not seem to be a big problem now. She was tested with Dr. Annamaria Boots years ago and had shots for around 1 year.  Otherwise, there is no history of other atopic diseases, including asthma, food allergies, drug allergies, environmental allergies, stinging insect allergies or contact dermatitis. There is no  significant infectious history. Vaccinations are up to date.    Past Medical History: Patient Active Problem List   Diagnosis Date Noted  . Seasonal and perennial allergic rhinitis 10/11/2019  . Angio-edema 08/30/2019  . Allergic reaction 08/30/2019  . Morbid obesity due to excess calories ([place]) 02/06/2019  . Osteoarthrosis of hand 12/28/2017  . Special screening for malignant neoplasms, colon   . Rhinitis, allergic 06/18/2017  . Right-sided Bell's palsy 02/02/2016  . Spondylolisthesis of lumbar region 08/25/2015  . Type II diabetes mellitus ([place])   . Abnormal stress test   . Hypercholesterolemia   . Abnormal nuclear stress test 07/30/2015  . Preoperative clearance 07/22/2015  . Lower extremity edema 07/22/2015  . History of brain disorder 11/05/2014  . H/O: obesity 11/05/2014  . Depression, major, recurrent, moderate ([place]) 11/05/2014  . Difficulty with family 11/05/2014  . Feeling stressed out 11/05/2014  . H/O: HTN (hypertension) 11/05/2014  . H/O elevated lipids 11/05/2014  . Anxiety, generalized 11/05/2014  . Migraine headache 11/05/2014  . Moderate recurrent major depression ([place]) 10/16/2014  . Generalized anxiety disorder 10/16/2014  . Edema 10/14/2014  . Depressive disorder 10/14/2014  . Obstructive sleep apnea 10/14/2014  . Diabetes ([place]) 10/14/2014  . Essential hypertension 10/14/2014  .  Epilepsy ([place]) 10/14/2014  . Migraine 10/14/2014  . Hyperlipemia 10/14/2014  . Dysthymic disorder 10/14/2014  . Seizure ([place]) 10/14/2014  . Hypercholesteremia 10/14/2014  . Palpitation 10/14/2014  . Depression 10/14/2014  . Dyspepsia 10/14/2014  . Hematochezia 10/14/2014  . Urge incontinence 10/14/2014  . Hair thinning 10/14/2014  . Dermatitis 09/10/2014  . Acid indigestion 09/10/2014  . Alopecia 09/10/2014  . Blood in feces 09/10/2014  . H/O hypercholesterolemia 09/10/2014  . Arthralgia of hip 09/10/2014  . Feeling bilious 09/10/2014  . Awareness of heartbeats  09/10/2014  . Pain in the wrist 09/10/2014  . Convulsions ([place]) 09/10/2014  . Urge incontinence 09/10/2014  . Snapping thumb syndrome 04/19/2014  . Diabetes mellitus ([place]) 03/14/2014  . De Quervain's disease (radial styloid tenosynovitis) 03/08/2014  . LBP (low back pain) 03/07/2013  . Mixed hyperlipidemia 01/15/2013  . Encounter for long-term (current) use of other medications - statin 01/15/2013  . Fatigue 01/15/2013  . Heart palpation 01/15/2013  . Obesity (BMI 30-39.9) 01/02/2013  . CCF (congestive cardiac failure) ([place]) 11/13/2009  . Malaise and fatigue 02/17/2009  . Sprain and strain of interphalangeal (joint) of hand 10/23/2007  . HEADACHE 06/23/2007  . Diabetes mellitus type 2 in obese ([place]) 05/08/2007  . HYPERCALCEMIA 05/08/2007  . Sleep apnea 05/08/2007  . Edema 05/08/2007  . DIARRHEA, CHRONIC 05/08/2007  . Clinical depression 10/20/2006  . Obstructive apnea 03/22/2006  . Diabetes mellitus, type 2 ([place]) 11/12/2005  . Depression, neurotic 11/12/2005  . Epilepsy ([place]) 11/12/2005  . Combined fat and carbohydrate induced hyperlipemia 11/12/2005  . Acute onset aura migraine 11/12/2005    Medication List:  Allergies as of 10/11/2019      Reactions   Morphine Rash, Swelling   Morphine And Related Hives   Nausea and vomiting Nausea and vomiting   Oxycodone Hives, Itching, Rash, Swelling   Oxycodone Hcl Swelling, Hives, Itching   Aimovig [erenumab-aooe]    Questionable lip swelling angoiedema   Morphine Sulfate Itching, Nausea And Vomiting   REACTION: swelling      Medication List       Accurate as of October 11, 2019  4:45 PM. If you have any questions, ask your nurse or doctor.        Aimovig 140 MG/ML Soaj Generic drug: Erenumab-aooe ONE 140 MG AUTOINJECTOR PER MONTH FOR MIGRAINE   amLODipine 5 MG tablet Commonly known as: NORVASC TAKE 1.5 TABLETS (7.5 MG TOTAL) BY MOUTH DAILY.   atorvastatin 80 MG tablet Commonly known as: LIPITOR Take 1 tablet (80 mg  total) by mouth daily.   baclofen 10 MG tablet Commonly known as: LIORESAL Take 10 mg by mouth 2 (two) times daily as needed for muscle spasms.   Botox 100 units Solr injection Generic drug: botulinum toxin Type A Inject into the muscle every 3 (three) months.   chlorproMAZINE 25 MG tablet Commonly known as: THORAZINE Take 25 mg by mouth 3 (three) times daily as needed for nausea.   diphenhydramine-acetaminophen 25-500 MG Tabs tablet Commonly known as: TYLENOL PM Take 1 tablet by mouth at bedtime as needed.   EPINEPHrine 0.3 mg/0.3 mL Soaj injection Commonly known as: EPI-PEN Inject 0.3 mLs (0.3 mg total) into the muscle as needed for up to 1 dose for anaphylaxis (Call 911 if used.).   ezetimibe 10 MG tablet Commonly known as: ZETIA Take 1 tablet (10 mg total) by mouth daily.   fluocinonide ointment 0.05 % Commonly known as: LIDEX Apply 1 application topically 2 (two) times daily.   fluticasone  50 MCG/ACT nasal spray Commonly known as: FLONASE Place 2 sprays into both nostrils daily.   furosemide 20 MG tablet Commonly known as: LASIX TAKE 1 TABLET BY MOUTH EVERY DAY   lamoTRIgine 100 MG tablet Commonly known as: LAMICTAL Take 100 mg by mouth 2 (two) times daily.   levocetirizine 5 MG tablet Commonly known as: XYZAL TAKE 1 TABLET BY MOUTH EVERY DAY IN THE EVENING What changed:   how much to take  how to take this  when to take this   metFORMIN 500 MG tablet Commonly known as: GLUCOPHAGE TAKE 2 TABLETS BY MOUTH IN THE MORNING ,1 TABLET AT LUNCH, & TAKE 2 TABLETS IN THE EVENING   metoprolol tartrate 100 MG tablet Commonly known as: LOPRESSOR TAKE 1 TABLET BY MOUTH TWICE A DAY   onetouch ultrasoft lancets Test fasting each morning and 2 hours before supper. Retest if having hypoglycemic symptoms.   OneTouch Verio test strip Generic drug: glucose blood TEST FASTING GLUCOSE DAILY AS DIRECTED What changed: See the new instructions.   TEGretol-XR 400 MG 12  hr tablet Generic drug: carbamazepine TAKE 1 TABLET BY MOUTH TWICE A DAY What changed: how much to take   Viibryd 40 MG Tabs Generic drug: Vilazodone HCl TAKE 1 TABLET BY MOUTH EVERY DAY       Birth History: non-contributory  Developmental History: non-contributory  Past Surgical History: Past Surgical History:  Procedure Laterality Date  . ABDOMINAL HYSTERECTOMY  1990   without BSO  . BACK SURGERY  1900s 2001   x2 with "cage put in"  . BILATERAL SALPINGOOPHORECTOMY  1990  . CARDIAC CATHETERIZATION N/A 08/06/2015   Procedure: Left Heart Cath and Coronary Angiography;  Surgeon: Troy Sine, MD;  Location: [facility];  Service: Cardiovascular;  Laterality: N/A;  . CARPAL TUNNEL RELEASE Bilateral   . CHOLECYSTECTOMY  1980  . COLONOSCOPY WITH PROPOFOL N/A 08/30/2017   Procedure: COLONOSCOPY WITH PROPOFOL;  Surgeon: Lin Landsman, MD;  [facility];  Service: Endoscopy;  Laterality: N/A;  . DG THUMB LEFT HAND  01/24/2018   repair joint in left thumb  . DORSAL COMPARTMENT RELEASE Left 10/25/2014   Procedure: LEFT FIRST  DORSAL COMPARTMENT RELEASE AND RADIAL TENOSYNOVECTOMY ;  Surgeon: Roseanne Kaufman, MD;  Location: [facility];  Service: Orthopedics;  Laterality: Left;  . HAND SURGERY    . KNEE SURGERY Right 2012   meniscus tear  . KNEE SURGERY  2012  . LAMINECTOMY  1995  . TUBAL LIGATION  1982  . WRIST SURGERY Right      Family History: Family History  Problem Relation Age of Onset  . Sarcoidosis Mother   . Seizures Mother   . Heart attack Mother   . Alzheimer's disease Maternal Grandmother   . Dementia Maternal Grandmother   . Hypertension Maternal Grandfather   . Diabetes Maternal Grandfather   . Breast cancer Neg Hx   . Allergic rhinitis Neg Hx   . Asthma Neg Hx   . Eczema Neg Hx   . Urticaria Neg Hx      Social History: Abigail Figueroa lives at home in a house that is 59 years old. There is vinyl in the main living areas and  carpeting in the bedroom. She has gas heating and central cooling. There are 2 dogs inside of the home. She does have dust mite covers on the bed as well as the pillows. There is no tobacco exposure. She is currently disabled. There is no  HEPA filter use. There are no fume or chemical exposures. She does not live near an interstate or industrial area. She smoked from 1978-1984.   Review of Systems  Constitutional: Negative for diaphoresis, fever, malaise/fatigue and weight loss.  HENT: Negative.  Negative for congestion, ear discharge, ear pain and sore throat.   Eyes: Negative for pain, discharge and redness.  Respiratory: Negative for cough, sputum production, shortness of breath and wheezing.   Cardiovascular: Negative.  Negative for chest pain and palpitations.  Gastrointestinal: Negative for abdominal pain, constipation, diarrhea, heartburn, nausea and vomiting.  Skin: Positive for itching. Negative for rash.       Positive for angioedema.  Neurological: Negative for dizziness and headaches.  Endo/Heme/Allergies: Negative for environmental allergies. Does not bruise/bleed easily.       Objective:   Blood pressure (!) 155/78, pulse 78, resp. rate 18, height 5' 2.3" (1.582 m), weight 214 lb (97.1 kg), SpO2 97 %. Body mass index is 38.77 kg/m.   Physical Exam:   Physical Exam  Constitutional: She appears well-developed. She is cooperative.  Very pleasant talkative female.  HENT:  Head: Normocephalic and atraumatic.  Right Ear: Tympanic membrane, external ear and ear canal normal. No drainage, swelling or tenderness. Tympanic membrane is not injected, not scarred, not erythematous, not retracted and not bulging.  Left Ear: Tympanic membrane, external ear and ear canal normal. No drainage, swelling or tenderness. Tympanic membrane is not injected, not scarred, not erythematous, not retracted and not bulging.  Nose: No mucosal edema, rhinorrhea, nasal deformity or septal deviation.  Right sinus exhibits no maxillary sinus tenderness and no frontal sinus tenderness. Left sinus exhibits no maxillary sinus tenderness and no frontal sinus tenderness.  Mouth/Throat: Uvula is midline. Mucous membranes are not pale and not dry.  Eyes: Pupils are equal, round, and reactive to light. Conjunctivae are normal. Right eye exhibits no chemosis and no discharge. Left eye exhibits no chemosis and no discharge. Right conjunctiva is not injected. Left conjunctiva is not injected.  Cardiovascular: Normal rate, regular rhythm and normal heart sounds.  Respiratory: Effort normal and breath sounds normal. No accessory muscle usage. No tachypnea. No respiratory distress. She has no wheezes. She has no rhonchi. She has no rales. She exhibits no tenderness.  GI: There is no abdominal tenderness. There is no rebound and no guarding.  Lymphadenopathy:       Head (right side): No submandibular, no tonsillar and no occipital adenopathy present.       Head (left side): No submandibular, no tonsillar and no occipital adenopathy present.    She has no cervical adenopathy.  Neurological: She is alert.  Skin: No abrasion, no petechiae and no rash noted. Rash is not papular, not vesicular and not urticarial. No erythema. No pallor.     Diagnostic studies:   Allergy Studies:     Airborne Adult Perc - 10/11/19 1506    Time Antigen Placed 0300    Allergen Manufacturer Lavella Hammock    Location Back    Number of Test 59    Panel 1 Select    1. Control-Buffer 50% Glycerol Negative    2. Control-Histamine 1 mg/ml 2+    3. Albumin saline Negative    4. [place] Negative    5. [place] Negative    6. Johnson Negative    7. Napoleon Blue Negative    8. Meadow Fescue Negative    9. Perennial Rye Negative    10. Sweet Vernal Negative    11. Christia Reading  Negative    12. Cocklebur Negative    13. Burweed Marshelder Negative    14. Ragweed, short Negative    15. Ragweed, Giant Negative    16. Plantain,  English Negative     17. Lamb's Quarters Negative    18. Sheep Sorrell Negative    19. Rough Pigweed Negative    20. Marsh Elder, Rough Negative    21. Mugwort, Common Negative    22. Ash mix Negative    23. Birch mix Negative    24. Beech American Negative    25. Box, Elder Negative    26. Cedar, red 4+    27. Cottonwood, [place] Negative    28. Elm mix Negative    29. Hickory Negative    30. Maple mix Negative    31. Oak, [place] mix Negative    32. Pecan Pollen Negative    33. Pine mix Negative    34. Sycamore Eastern Negative    35. Anderson, Black Pollen Negative    36. Alternaria alternata Negative    37. Cladosporium Herbarum Negative    38. Aspergillus mix Negative    39. Penicillium mix Negative    40. Bipolaris sorokiniana (Helminthosporium) Negative    41. Drechslera spicifera (Curvularia) Negative    42. Mucor plumbeus Negative    43. Fusarium moniliforme Negative    44. Aureobasidium pullulans (pullulara) Negative    45. Rhizopus oryzae Negative    46. Botrytis cinera Negative    47. Epicoccum nigrum Negative    48. Phoma betae Negative    49. Candida Albicans Negative    50. Trichophyton mentagrophytes Negative    51. Mite, D Farinae  5,000 AU/ml Negative    52. Mite, D Pteronyssinus  5,000 AU/ml Negative    53. Cat Hair 10,000 BAU/ml Negative    54.  Dog Epithelia 3+    55. Mixed Feathers Negative    56. Horse Epithelia Negative    57. Cockroach, German Negative    58. Mouse Negative    59. Tobacco Leaf Negative          Intradermal - 10/11/19 1539    Time Antigen Placed 1539    Allergen Manufacturer Lavella Hammock    Location Back    Number of Test 13    Intradermal Select    Control Negative    Guatem[place] Negative    Johnson Negative    7 Grass Negative    Ragweed mix 3+    Weed mix Negative    Tree mix 1+    Mold 1 2+    Mold 2 Negative    Mold 3 Negative    Mold 4 Negative    Cat 2+    Cockroach Negative    Mite mix 3+          Food Adult Perc - 10/11/19 1500      Time Antigen Placed 0300    Allergen Manufacturer Greer    Location Back    Number of allergen test 17    1. Peanut Negative    2. Soybean Negative    3. Wheat Negative    4. Sesame Negative    5. Milk, cow Negative    6. Egg White, Chicken Negative    7. Casein Negative    8. Shellfish Mix Negative    9. Fish Mix Negative    10. Cashew Negative    11. Pecan Food Negative    12. [place] Negative  13. Almond Negative    14. Hazelnut Negative    15. [place] nut Negative    16. Coconut Negative    17. Pistachio Negative           Allergy testing results were read and interpreted by myself, documented by clinical staff.         Salvatore Marvel, MD [facility]

## 2019-10-11 NOTE — Patient Instructions (Addendum)
1. Angioedema with urticaria/itching - Testing was positive to red cedar (winter blooming tree) and dog as well as ragweed, trees, outdoor molds, cat, and dust mite. - Copy of results provided. - Avoidance measures provided. - The dog might be related to these episodes, but I am not sure. - Dust mite is also known to do this.  - Regardless, we are going to increase your Zyrtec (cetirizine) to one tablet twice daily to suppress these episodes. - We are going to get some labs to look for serious causes of hives/swelling. - We will call you in 1-2 weeks with the results of the testing.  - Allergy shots are a possibility, although I do not think we are at that point right now.   2. Return in about 6 weeks (around 11/22/2019). This can be an in-person, a virtual Webex or a telephone follow up visit.   Please inform us of any Emergency Department visits, hospitalizations, or changes in symptoms. Call us before going to the ED for breathing or allergy symptoms since we might be able to fit you in for a sick visit. Feel free to contact us anytime with any questions, problems, or concerns.  It was a pleasure to meet you today!  Websites that have reliable patient information: 1. American Academy of Asthma, Allergy, and Immunology: www.aaaai.org 2. Food Allergy Research and Education (FARE): foodallergy.org 3. Mothers of Asthmatics: http://www.asthmacommunitynetwork.org 4. American College of Allergy, Asthma, and Immunology: www.acaai.org   COVID-19 Vaccine Information can be found at: ShippingScam.co.uk For questions related to vaccine distribution or appointments, please email [email or web address] or call [phone].     "Like" [place] on Facebook and Instagram for our latest updates!        Make sure you are registered to vote! If you have moved or changed any of your contact information, you will need to get this updated before  voting!  In some cases, you MAY be able to register to vote online: [email or web address]    Reducing Pollen Exposure  The American Academy of Allergy, Asthma and Immunology suggests the following steps to reduce your exposure to pollen during allergy seasons.    1. Do not hang sheets or clothing out to dry; pollen may collect on these items. 2. Do not mow lawns or spend time around freshly cut grass; mowing stirs up pollen. 3. Keep windows closed at night.  Keep car windows closed while driving. 4. Minimize morning activities outdoors, a time when pollen counts are usually at their highest. 5. Stay indoors as much as possible when pollen counts or humidity is high and on windy days when pollen tends to remain in the air longer. 6. Use air conditioning when possible.  Many air conditioners have filters that trap the pollen spores. 7. Use a HEPA room air filter to remove pollen form the indoor air you breathe.  Control of Mold Allergen   Mold and fungi can grow on a variety of surfaces provided certain temperature and moisture conditions exist.  Outdoor molds grow on plants, decaying vegetation and soil.  The major outdoor mold, Alternaria and Cladosporium, are found in very high numbers during hot and dry conditions.  Generally, a late Summer - Fall peak is seen for common outdoor fungal spores.  Rain will temporarily lower outdoor mold spore count, but counts rise rapidly when the rainy period ends.  The most important indoor molds are Aspergillus and Penicillium.  Dark, humid and poorly ventilated basements are ideal sites for mold growth.  The next  most common sites of mold growth are the bathroom and the kitchen.  Outdoor (Seasonal) Mold Control  Positive outdoor molds via skin testing: Alternaria and Cladosporium  1. Use air conditioning and keep windows closed 2. Avoid exposure to decaying vegetation. 3. Avoid leaf raking. 4. Avoid grain  handling. 5. Consider wearing a face mask if working in moldy areas.       Control of Dust Mite Allergen    Dust mites play a major role in allergic asthma and rhinitis.  They occur in environments with high humidity wherever human skin is found.  Dust mites absorb humidity from the atmosphere (ie, they do not drink) and feed on organic matter (including shed human and animal skin).  Dust mites are a microscopic type of insect that you cannot see with the naked eye.  High levels of dust mites have been detected from mattresses, pillows, carpets, upholstered furniture, bed covers, clothes, soft toys and any woven material.  The principal allergen of the dust mite is found in its feces.  A gram of dust may contain 1,000 mites and 250,000 fecal particles.  Mite antigen is easily measured in the air during house cleaning activities.  Dust mites do not bite and do not cause harm to humans, other than by triggering allergies/asthma.    Ways to decrease your exposure to dust mites in your home:  1. Encase mattresses, box springs and pillows with a mite-impermeable barrier or cover   2. Wash sheets, blankets and drapes weekly in hot water (130 F) with detergent and dry them in a dryer on the hot setting.  3. Have the room cleaned frequently with a vacuum cleaner and a damp dust-mop.  For carpeting or rugs, vacuuming with a vacuum cleaner equipped with a high-efficiency particulate air (HEPA) filter.  The dust mite allergic individual should not be in a room which is being cleaned and should wait 1 hour after cleaning before going into the room. 4. Do not sleep on upholstered furniture (eg, couches).   5. If possible removing carpeting, upholstered furniture and drapery from the home is ideal.  Horizontal blinds should be eliminated in the rooms where the person spends the most time (bedroom, study, television room).  Washable vinyl, roller-type shades are optimal. 6. Remove all non-washable stuffed toys  from the bedroom.  Wash stuffed toys weekly like sheets and blankets above.   7. Reduce indoor humidity to less than 50%.  Inexpensive humidity monitors can be purchased at most hardware stores.  Do not use a humidifier as can make the problem worse and are not recommended.  Control of Dog or Cat Allergen  Avoidance is the best way to manage a dog or cat allergy. If you have a dog or cat and are allergic to dog or cats, consider removing the dog or cat from the home. If you have a dog or cat but don't want to find it a new home, or if your family wants a pet even though someone in the household is allergic, here are some strategies that may help keep symptoms at bay:  1. Keep the pet out of your bedroom and restrict it to only a few rooms. Be advised that keeping the dog or cat in only one room will not limit the allergens to that room. 2. Don't pet, hug or kiss the dog or cat; if you do, wash your hands with soap and water. 3. High-efficiency particulate air (HEPA) cleaners run continuously in a bedroom or  living room can reduce allergen levels over time. 4. Regular use of a high-efficiency vacuum cleaner or a central vacuum can reduce allergen levels. 5. Giving your dog or cat a bath at least once a week can reduce airborne allergen.  Allergy Shots   Allergies are the result of a chain reaction that starts in the immune system. Your immune system controls how your body defends itself. For instance, if you have an allergy to pollen, your immune system identifies pollen as an invader or allergen. Your immune system overreacts by producing antibodies called Immunoglobulin E (IgE). These antibodies travel to cells that release chemicals, causing an allergic reaction.  The concept behind allergy immunotherapy, whether it is received in the form of shots or tablets, is that the immune system can be desensitized to specific allergens that trigger allergy symptoms. Although it requires time and patience,  the payback can be long-term relief.  How Do Allergy Shots Work?  Allergy shots work much like a vaccine. Your body responds to injected amounts of a particular allergen given in increasing doses, eventually developing a resistance and tolerance to it. Allergy shots can lead to decreased, minimal or no allergy symptoms.  There generally are two phases: build-up and maintenance. Build-up often ranges from three to six months and involves receiving injections with increasing amounts of the allergens. The shots are typically given once or twice a week, though more rapid build-up schedules are sometimes used.  The maintenance phase begins when the most effective dose is reached. This dose is different for each person, depending on how allergic you are and your response to the build-up injections. Once the maintenance dose is reached, there are longer periods between injections, typically two to four weeks.  Occasionally doctors give cortisone-type shots that can temporarily reduce allergy symptoms. These types of shots are different and should not be confused with allergy immunotherapy shots.  Who Can Be Treated with Allergy Shots?  Allergy shots may be a good treatment approach for people with allergic rhinitis (hay fever), allergic asthma, conjunctivitis (eye allergy) or stinging insect allergy.   Before deciding to begin allergy shots, you should consider:  . The length of allergy season and the severity of your symptoms . Whether medications and/or changes to your environment can control your symptoms . Your desire to avoid long-term medication use . Time: allergy immunotherapy requires a major time commitment . Cost: may vary depending on your insurance coverage  Allergy shots for children age 53 and older are effective and often well tolerated. They might prevent the onset of new allergen sensitivities or the progression to asthma.  Allergy shots are not started on patients who are  pregnant but can be continued on patients who become pregnant while receiving them. In some patients with other medical conditions or who take certain common medications, allergy shots may be of risk. It is important to mention other medications you talk to your allergist.   When Will I Feel Better?  Some may experience decreased allergy symptoms during the build-up phase. For others, it may take as long as 12 months on the maintenance dose. If there is no improvement after a year of maintenance, your allergist will discuss other treatment options with you.  If you aren't responding to allergy shots, it may be because there is not enough dose of the allergen in your vaccine or there are missing allergens that were not identified during your allergy testing. Other reasons could be that there are high levels of  the allergen in your environment or major exposure to non-allergic triggers like tobacco smoke.  What Is the Length of Treatment?  Once the maintenance dose is reached, allergy shots are generally continued for three to five years. The decision to stop should be discussed with your allergist at that time. Some people may experience a permanent reduction of allergy symptoms. Others may relapse and a longer course of allergy shots can be considered.  What Are the Possible Reactions?  The two types of adverse reactions that can occur with allergy shots are local and systemic. Common local reactions include very mild redness and swelling at the injection site, which can happen immediately or several hours after. A systemic reaction, which is less common, affects the entire body or a particular body system. They are usually mild and typically respond quickly to medications. Signs include increased allergy symptoms such as sneezing, a stuffy nose or hives.  Rarely, a serious systemic reaction called anaphylaxis can develop. Symptoms include swelling in the throat, wheezing, a feeling of tightness in  the chest, nausea or dizziness. Most serious systemic reactions develop within 30 minutes of allergy shots. This is why it is strongly recommended you wait in your doctor's office for 30 minutes after your injections. Your allergist is trained to watch for reactions, and his or her staff is trained and equipped with the proper medications to identify and treat them.  Who Should Administer Allergy Shots?  The preferred location for receiving shots is your prescribing allergist's office. Injections can sometimes be given at another facility where the physician and staff are trained to recognize and treat reactions, and have received instructions by your prescribing allergist.

## 2019-10-13 ENCOUNTER — Encounter: Payer: Self-pay | Admitting: Allergy & Immunology

## 2019-10-17 LAB — ALPHA-GAL PANEL
Alpha Gal IgE*: <0.1 kU/L (ref <0.10)
Beef (Bos spp) IgE: <0.1 kU/L (ref <0.35)
Class Interpretation: 0
Class Interpretation: 1
Lamb/Mutton (Ovis spp) IgE: 0.31 kU/L (ref <0.35)
Pork (Sus spp) IgE: 0.5 kU/L — ABNORMAL HIGH (ref <0.35)

## 2019-10-17 LAB — COMPLEMENT COMPONENT C1Q: Complement C1Q: 12.1 mg/dL (ref 10.3–20.5)

## 2019-10-17 LAB — CMP14+EGFR
ALT: 28 IU/L (ref 0–32)
AST: 23 IU/L (ref 0–40)
Albumin/Globulin Ratio: 1.5 (ref 1.2–2.2)
Albumin: 4.4 g/dL (ref 3.8–4.9)
Alkaline Phosphatase: 84 IU/L (ref 48–121)
BUN/Creatinine Ratio: 17 (ref 9–23)
BUN: 22 mg/dL (ref 6–24)
Bilirubin Total: <0.2 mg/dL (ref 0.0–1.2)
CO2: 22 mmol/L (ref 20–29)
Calcium: 10.4 mg/dL — ABNORMAL HIGH (ref 8.7–10.2)
Chloride: 108 mmol/L — ABNORMAL HIGH (ref 96–106)
Creatinine, Ser: 1.33 mg/dL — ABNORMAL HIGH (ref 0.57–1.00)
GFR calc Af Amer: 51 mL/min/{1.73_m2} — ABNORMAL LOW (ref >59)
GFR calc non Af Amer: 44 mL/min/{1.73_m2} — ABNORMAL LOW (ref >59)
Globulin, Total: 3 g/dL (ref 1.5–4.5)
Glucose: 132 mg/dL — ABNORMAL HIGH (ref 65–99)
Potassium: 4.4 mmol/L (ref 3.5–5.2)
Sodium: 145 mmol/L — ABNORMAL HIGH (ref 134–144)
Total Protein: 7.4 g/dL (ref 6.0–8.5)

## 2019-10-17 LAB — C-REACTIVE PROTEIN: CRP: 7 mg/L (ref 0–10)

## 2019-10-17 LAB — C1 ESTERASE INHIBITOR: C1INH SerPl-mCnc: 36 mg/dL (ref 21–39)

## 2019-10-17 LAB — C1 ESTERASE INHIBITOR, FUNCTIONAL: C1INH Functional/C1INH Total MFr SerPl: >91 %mean normal

## 2019-10-17 LAB — C3 AND C4
Complement C3, Serum: 174 mg/dL — ABNORMAL HIGH (ref 82–167)
Complement C4, Serum: 49 mg/dL — ABNORMAL HIGH (ref 12–38)

## 2019-10-17 LAB — ANA W/REFLEX IF POSITIVE: Anti Nuclear Antibody (ANA): NEGATIVE

## 2019-10-17 LAB — TRYPTASE: Tryptase: 6.5 ug/L (ref 2.2–13.2)

## 2019-10-17 LAB — SEDIMENTATION RATE: Sed Rate: 58 mm/hr — ABNORMAL HIGH (ref 0–40)

## 2019-11-02 DIAGNOSIS — G4733 Obstructive sleep apnea (adult) (pediatric): Secondary | ICD-10-CM | POA: Diagnosis not present

## 2019-11-07 DIAGNOSIS — G43719 Chronic migraine without aura, intractable, without status migrainosus: Secondary | ICD-10-CM | POA: Diagnosis not present

## 2019-11-07 DIAGNOSIS — M542 Cervicalgia: Secondary | ICD-10-CM | POA: Diagnosis not present

## 2019-11-07 DIAGNOSIS — M791 Myalgia, unspecified site: Secondary | ICD-10-CM | POA: Diagnosis not present

## 2019-11-22 ENCOUNTER — Ambulatory Visit (INDEPENDENT_AMBULATORY_CARE_PROVIDER_SITE_OTHER): Payer: Medicare Other | Admitting: Allergy & Immunology

## 2019-11-22 ENCOUNTER — Encounter: Payer: Self-pay | Admitting: Allergy & Immunology

## 2019-11-22 ENCOUNTER — Other Ambulatory Visit: Payer: Self-pay

## 2019-11-22 DIAGNOSIS — J302 Other seasonal allergic rhinitis: Secondary | ICD-10-CM | POA: Diagnosis not present

## 2019-11-22 DIAGNOSIS — T783XXD Angioneurotic edema, subsequent encounter: Secondary | ICD-10-CM

## 2019-11-22 DIAGNOSIS — T783XXS Angioneurotic edema, sequela: Secondary | ICD-10-CM

## 2019-11-22 DIAGNOSIS — J3089 Other allergic rhinitis: Secondary | ICD-10-CM | POA: Diagnosis not present

## 2019-11-22 NOTE — Progress Notes (Signed)
RE: Abigail Figueroa MRN: [id number] DOB: [birth year] Date of Telemedicine Visit: 11/22/2019  Referring provider: Margo Common, PA Primary care provider: Margo Common, PA  Chief Complaint: Angioedema   Telemedicine Follow Up Visit via Telephone: I connected with Abigail Figueroa for a follow up on 11/22/19 by telephone and verified that I am speaking with the correct person using two identifiers.   I discussed the limitations, risks, security and privacy concerns of performing an evaluation and management service by telephone and the availability of in person appointments. I also discussed with the patient that there may be a patient responsible charge related to this service. The patient expressed understanding and agreed to proceed.  Patient is at home.  Provider is at the office.  Visit start time: 11:31 AM Visit end time: 11:47 AM Insurance consent/check in by: [facility] consent and medical assistant/nurse: Dee  History of Present Illness:  She is a 59 y.o. female, who is being followed for angioedema with urticaria. Her previous allergy office visit was in June 2021 with myself.  At that visit, she had skin testing that was positive to cedar, ragweed, trees, outdoor molds, cat, and dust mite.  She did have testing that was negative to the most Figueroa foods.  We increased her Zyrtec to 10 mg twice daily.  We also got some labs to rule out serious causes of hives and swelling. Labs demonstrated positive   She did have a one week episode of sneezing last week. She has not had any more of the swelling. She feels that the medications are doing a good job for her. She is fine with avoiding allergy shots. She has not needed antibiotics at all since the last visit. She did have the allergy shots for one year through Dr. Annamaria Boots.   In retrospect, she thinks that this might have been related to the [facility]. She started the Amovig around 18 months. She did decide to stop the Amovig  and she has not had swelling episodes since that time. She is on Botox and has some oral medications for her migraines. So she is hopeful that she will be OK with this regimen. She estimates that she has migraines around 2-3 times per months. She has mild ones fairly frequently around 10 times per month.   Otherwise, there have been no changes to her past medical history, surgical history, family history, or social history.  Assessment and Plan:  Abigail Figueroa is a 59 y.o. female with:  Angioedema - with negative testing to the most Figueroa foods  Seasonal and perennial allergic rhinitis (ragweeds, trees, outdoor molds, cat, dust mite) - without any clear clinical reactivity in the form of allergic rhinitis   At this point, Abigail Figueroa is doing very well.  She feels that the Aimovig was the trigger of her angioedema and she has since stopped it.  He has had no episodes of subsequent angioedema.  Her allergic rhinitis is well controlled with antihistamines.  It does not seem that allergen immunotherapy is indicated at this time and she is in agreement with that.  We will see her again in 1 year or earlier if needed.  Diagnostics: None.  Medication List:  Current Outpatient Medications  Medication Sig Dispense Refill  . amLODipine (NORVASC) 5 MG tablet TAKE 1.5 TABLETS (7.5 MG TOTAL) BY MOUTH DAILY. 135 tablet 3  . atorvastatin (LIPITOR) 80 MG tablet Take 1 tablet (80 mg total) by mouth daily. 90 tablet 3  . baclofen (LIORESAL) 10  MG tablet Take 10 mg by mouth 2 (two) times daily as needed for muscle spasms.     Marland Kitchen BOTOX 100 UNITS SOLR injection Inject into the muscle every 3 (three) months.     . chlorproMAZINE (THORAZINE) 25 MG tablet Take 25 mg by mouth 3 (three) times daily as needed for nausea.     . diphenhydramine-acetaminophen (TYLENOL PM) 25-500 MG TABS tablet Take 1 tablet by mouth at bedtime as needed.    Marland Kitchen EPINEPHrine 0.3 mg/0.3 mL IJ SOAJ injection Inject 0.3 mLs (0.3 mg total) into the  muscle as needed for up to 1 dose for anaphylaxis (Call 911 if used.). 1 each 1  . ezetimibe (ZETIA) 10 MG tablet Take 1 tablet (10 mg total) by mouth daily. 90 tablet 3  . fluocinonide ointment (LIDEX) 4.78 % Apply 1 application topically 2 (two) times daily. 30 g 1  . fluticasone (FLONASE) 50 MCG/ACT nasal spray Place 2 sprays into both nostrils daily. 16 g 6  . furosemide (LASIX) 20 MG tablet TAKE 1 TABLET BY MOUTH EVERY DAY 90 tablet 1  . lamoTRIgine (LAMICTAL) 100 MG tablet Take 100 mg by mouth 2 (two) times daily.    . Lancets (ONETOUCH ULTRASOFT) lancets Test fasting each morning and 2 hours before supper. Retest if having hypoglycemic symptoms. 100 each 12  . levocetirizine (XYZAL) 5 MG tablet Take 1 tablet by mouth twice daily 60 tablet 5  . metFORMIN (GLUCOPHAGE) 500 MG tablet TAKE 2 TABLETS BY MOUTH IN THE MORNING ,1 TABLET AT LUNCH, & TAKE 2 TABLETS IN THE EVENING 450 tablet 1  . metoprolol tartrate (LOPRESSOR) 100 MG tablet TAKE 1 TABLET BY MOUTH TWICE A DAY 180 tablet 2  . ONETOUCH VERIO test strip TEST FASTING GLUCOSE DAILY AS DIRECTED (Patient taking differently: 1 each by Other route as directed. ) 100 each 3  . TEGRETOL-XR 400 MG 12 hr tablet TAKE 1 TABLET BY MOUTH TWICE A DAY (Patient taking differently: Take 400 mg by mouth 2 (two) times daily. ) 180 tablet 0  . VIIBRYD 40 MG TABS TAKE 1 TABLET BY MOUTH EVERY DAY 90 tablet 1   No current facility-administered medications for this visit.   Allergies: Allergies  Allergen Reactions  . Morphine Rash and Swelling  . Morphine And Related Hives    Nausea and vomiting  Nausea and vomiting  . Oxycodone Hives, Itching, Rash and Swelling  . Oxycodone Hcl Swelling, Hives and Itching  . Aimovig [Erenumab-Aooe]     Questionable lip swelling angoiedema  . Morphine Sulfate Itching and Nausea And Vomiting    REACTION: swelling   I reviewed her past medical history, social history, family history, and environmental history and no  significant changes have been reported from previous visits.   Review of Systems  Constitutional: Negative for activity change, appetite change, chills, fatigue and fever.  HENT: Negative for congestion, postnasal drip, rhinorrhea, sinus pressure and sore throat.   Eyes: Negative for pain, discharge, redness and itching.  Respiratory: Negative for shortness of breath, wheezing and stridor.   Gastrointestinal: Negative for diarrhea, nausea and vomiting.  Endocrine: Negative for cold intolerance and heat intolerance.  Musculoskeletal: Negative for arthralgias, joint swelling and myalgias.  Skin: Negative for rash.  Allergic/Immunologic: Negative for environmental allergies and food allergies.    Objective:  Physical exam not obtained as encounter was done via telephone.   Previous notes and tests were reviewed.  I discussed the assessment and treatment plan with the patient. The patient  was provided an opportunity to ask questions and all were answered. The patient agreed with the plan and demonstrated an understanding of the instructions.   The patient was advised to call back or seek an in-person evaluation if the symptoms worsen or if the condition fails to improve as anticipated.  I provided 16 minutes of non-face-to-face time during this encounter.  It was my pleasure to participate in [place] care today. Please feel free to contact me with any questions or concerns.   Sincerely,  Valentina Shaggy, MD

## 2019-12-03 ENCOUNTER — Other Ambulatory Visit: Payer: Self-pay

## 2019-12-03 ENCOUNTER — Telehealth (INDEPENDENT_AMBULATORY_CARE_PROVIDER_SITE_OTHER): Payer: Medicare Other | Admitting: Adult Health

## 2019-12-03 ENCOUNTER — Encounter: Payer: Self-pay | Admitting: Adult Health

## 2019-12-03 DIAGNOSIS — L299 Pruritus, unspecified: Secondary | ICD-10-CM

## 2019-12-03 DIAGNOSIS — B029 Zoster without complications: Secondary | ICD-10-CM

## 2019-12-03 MED ORDER — VALACYCLOVIR HCL 1 G PO TABS: 1000.0000 mg | ORAL_TABLET | Freq: Three times a day (TID) | ORAL | 0 refills | Status: DC

## 2019-12-03 MED ORDER — HYDROCORTISONE 1 % EX OINT: 1.0000 "application " | TOPICAL_OINTMENT | Freq: Two times a day (BID) | CUTANEOUS | 0 refills | Status: DC

## 2019-12-03 MED ORDER — PREDNISONE 10 MG (21) PO TBPK: ORAL_TABLET | ORAL | 0 refills | Status: DC

## 2019-12-03 NOTE — Progress Notes (Signed)
Virtual Visit via Video Note  I connected with Abigail Figueroa on 12/03/19 at  8:00 AM EDT by a video enabled telemedicine application and verified that I am speaking with the correct person using two identifiers.    I discussed the limitations of evaluation and management by telemedicine and the availability of in person appointments. The patient expressed understanding and agreed to proceed.  Marcille Buffy, FNP  I connected with Abigail Figueroa on 12/03/19 at  8:00 AM EDT by a video enabled telemedicine application and verified that I am speaking with the correct person using two identifiers.  Location: Patient: at home  Provider: Provider: Provider's office at  [facility], [place].      I discussed the limitations of evaluation and management by telemedicine and the availability of in person appointments. The patient expressed understanding and agreed to proceed.  History of Present Illness:  Patient is a 59 year old female in no acute distress who comes for a virtual visit for a rash that started the middle of last week. Rash is on the right side of her body, right chest, right lower hip, and right lower abdomen.  She reports itching with burning and painful.  Denies any new medications, or exposures.   She was allergy tested since last visit and it was determined she was allergic to the migraine medication she was taking the Aimovig. This was discontinued.   Patient  denies any fever, body aches,chills, rash, chest pain, shortness of breath, nausea, vomiting, or diarrhea.    Observations/Objective:   Patient is alert and oriented and responsive to questions Engages in conversation with provider. Speaks in full sentences without any pauses without any shortness of breath or distress.   Able to visualize through video quality erythematous patchy rash with vesicles, closed blisters of the right upper chest above right breast, right hip  near pubis and right lower quadrant of abdomen.  Assessment and Plan:  1. Herpes zoster without complication Discussed risks and also course of rash.  Over the counter topicals for shingles ok once blisters have dried up, not for open blisters.  - valACYclovir (VALTREX) 1000 MG tablet; Take 1 tablet (1,000 mg total) by mouth 3 (three) times daily.  Dispense: 21 tablet; Refill: 0 - predniSONE (STERAPRED UNI-PAK 21 TAB) 10 MG (21) TBPK tablet; PO: Take 6 tablets on day 1:Take 5 tablets day 2:Take 4 tablets day 3: Take 3 tablets day 4:Take 2 tablets day five: 5 Take 1 tablet day 6  Dispense: 21 tablet; Refill: 0  2. Pruritus Cool showers and rags to area ok.   - valACYclovir (VALTREX) 1000 MG tablet; Take 1 tablet (1,000 mg total) by mouth 3 (three) times daily.  Dispense: 21 tablet; Refill: 0 - predniSONE (STERAPRED UNI-PAK 21 TAB) 10 MG (21) TBPK tablet; PO: Take 6 tablets on day 1:Take 5 tablets day 2:Take 4 tablets day 3: Take 3 tablets day 4:Take 2 tablets day five: 5 Take 1 tablet day 6  Dispense: 21 tablet; Refill: 0 - hydrocortisone 1 % ointment; Apply 1 application topically 2 (two) times daily. To areas of itching not to use if any blisters or open wounds. USE PRN itching.  Dispense: 30 g; Refill: 0 Follow Up Instructions: Call if worsening or if pain/ burning worsens.   Advised patient call the office or your primary care doctor for an appointment if no improvement within 72 hours or if any symptoms change or worsen at any time  Advised ER or urgent Care if after hours or on weekend. Call 911 for emergency symptoms at any time.Patinet verbalized understanding of all instructions given/reviewed and treatment plan and has no further questions or concerns at this time.       I discussed the assessment and treatment plan with the patient. The patient was provided an opportunity to ask questions and all were answered. The patient agreed with the plan and demonstrated an understanding of the  instructions.   The patient was advised to call back or seek an in-person evaluation if the symptoms worsen or if the condition fails to improve as anticipated.  I provided 22  minutes of non-face-to-face time during this encounter.   Marcille Buffy, FNP

## 2019-12-27 DIAGNOSIS — G43719 Chronic migraine without aura, intractable, without status migrainosus: Secondary | ICD-10-CM | POA: Diagnosis not present

## 2019-12-27 DIAGNOSIS — G518 Other disorders of facial nerve: Secondary | ICD-10-CM | POA: Diagnosis not present

## 2019-12-27 DIAGNOSIS — M542 Cervicalgia: Secondary | ICD-10-CM | POA: Diagnosis not present

## 2019-12-31 ENCOUNTER — Other Ambulatory Visit: Payer: Self-pay | Admitting: Family Medicine

## 2019-12-31 DIAGNOSIS — F331 Major depressive disorder, recurrent, moderate: Secondary | ICD-10-CM

## 2020-01-20 ENCOUNTER — Other Ambulatory Visit: Payer: Self-pay | Admitting: Family Medicine

## 2020-01-20 NOTE — Telephone Encounter (Signed)
Requested medications are due for refill today?  Yes  Requested medications are on active medication list?  Yes  Last Refill:   07/25/2019  #450 with one refill   Future visit scheduled?  No   Notes to Clinic:  Medication failed Rx refill protocol due to no Hgb A1C in the past 180 days.  Last Hgb A1C performed on 06/26/2019.

## 2020-02-05 ENCOUNTER — Ambulatory Visit (INDEPENDENT_AMBULATORY_CARE_PROVIDER_SITE_OTHER): Payer: Medicare Other | Admitting: Family Medicine

## 2020-02-05 ENCOUNTER — Other Ambulatory Visit: Payer: Self-pay

## 2020-02-05 ENCOUNTER — Encounter: Payer: Self-pay | Admitting: Family Medicine

## 2020-02-05 VITALS — BP 162/74 | HR 72 | Temp 98.2°F | Wt 225.0 lb

## 2020-02-05 DIAGNOSIS — G43719 Chronic migraine without aura, intractable, without status migrainosus: Secondary | ICD-10-CM | POA: Diagnosis not present

## 2020-02-05 DIAGNOSIS — M25562 Pain in left knee: Secondary | ICD-10-CM

## 2020-02-05 DIAGNOSIS — M542 Cervicalgia: Secondary | ICD-10-CM | POA: Diagnosis not present

## 2020-02-05 DIAGNOSIS — E1100 Type 2 diabetes mellitus with hyperosmolarity without nonketotic hyperglycemic-hyperosmolar coma (NKHHC): Secondary | ICD-10-CM

## 2020-02-05 DIAGNOSIS — M25561 Pain in right knee: Secondary | ICD-10-CM

## 2020-02-05 DIAGNOSIS — G8929 Other chronic pain: Secondary | ICD-10-CM | POA: Diagnosis not present

## 2020-02-05 DIAGNOSIS — F331 Major depressive disorder, recurrent, moderate: Secondary | ICD-10-CM

## 2020-02-05 DIAGNOSIS — E7800 Pure hypercholesterolemia, unspecified: Secondary | ICD-10-CM

## 2020-02-05 DIAGNOSIS — Z23 Encounter for immunization: Secondary | ICD-10-CM | POA: Diagnosis not present

## 2020-02-05 DIAGNOSIS — M791 Myalgia, unspecified site: Secondary | ICD-10-CM | POA: Diagnosis not present

## 2020-02-05 NOTE — Progress Notes (Signed)
Acute Office Visit  Subjective:    Patient ID: Abigail Figueroa, female    DOB: March 08, 1961, 59 y.o.   MRN: [id number]    HPI Patient is in today for anxiety and knee pain.  The patient states she has experienced a lot of death in the her life recently and also has increased stress at home.  She states that her knees are both bothering her but the left is worse than the right.  There has been no known trauma.  She started having symptoms 2 weeks ago.  She has been using icy hot with no relief.  No redness of the knee but she has noticed some swelling.  Past Medical History:  Diagnosis Date  . Abnormal stress test    a. 10/2009: Normal myocardial perfusion imaging; b. 08/2015 MV: medium defect of mod severity in mid ant apical region w/ mild HK of the distal inf wall;  c. 08/2015 Cath: nl Cors, EF 60%.  . Anemia   . Angio-edema   . Anxiety   . Arthritis   . Bell's palsy   . Depression   . Edema   . Essential hypertension   . Frequent urination   . Frequent urination at night   . Headache    migraines, gets botox injections  . Heart palpitations June 2013   Event monitor showing sinus tachycardia, PACs with couplets and triplets.  Marland Kitchen Hx of echocardiogram    a. 10/2009 Echo: showed normal left ventricular function, mild left ventricular hypertrophy, and no significant valve abnormalities.  . Hypercholesterolemia   . Migraine   . Morbid obesity ([place])   . Neuromuscular disorder ([place])   . OSA (obstructive sleep apnea)    has been on continuous positive airway pressure  . Seizure disorder ([place])   . Seizures ([place])   . Type II diabetes mellitus ([place])   . Urticaria     Past Surgical History:  Procedure Laterality Date  . ABDOMINAL HYSTERECTOMY  1990   without BSO  . BACK SURGERY  1900s 2001   x2 with "cage put in"  . BILATERAL SALPINGOOPHORECTOMY  1990  . CARDIAC CATHETERIZATION N/A 08/06/2015   Procedure: Left Heart Cath and Coronary Angiography;  Surgeon: Troy Sine, MD;   Location: [facility];  Service: Cardiovascular;  Laterality: N/A;  . CARPAL TUNNEL RELEASE Bilateral   . CHOLECYSTECTOMY  1980  . COLONOSCOPY WITH PROPOFOL N/A 08/30/2017   Procedure: COLONOSCOPY WITH PROPOFOL;  Surgeon: Lin Landsman, MD;  Location: [facility];  Service: Endoscopy;  Laterality: N/A;  . DG THUMB LEFT HAND  01/24/2018   repair joint in left thumb  . DORSAL COMPARTMENT RELEASE Left 10/25/2014   Procedure: LEFT FIRST  DORSAL COMPARTMENT RELEASE AND RADIAL TENOSYNOVECTOMY ;  Surgeon: Roseanne Kaufman, MD;  Location: [facility];  Service: Orthopedics;  Laterality: Left;  . HAND SURGERY    . KNEE SURGERY Right 2012   meniscus tear  . KNEE SURGERY  2012  . LAMINECTOMY  1995  . TUBAL LIGATION  1982  . WRIST SURGERY Right     Family History  Problem Relation Age of Onset  . Sarcoidosis Mother   . Seizures Mother   . Heart attack Mother   . Alzheimer's disease Maternal Grandmother   . Dementia Maternal Grandmother   . Hypertension Maternal Grandfather   . Diabetes Maternal Grandfather   . Breast cancer Neg Hx   . Allergic rhinitis Neg Hx   . Asthma  Neg Hx   . Eczema Neg Hx   . Urticaria Neg Hx     Social History   Socioeconomic History  . Marital status: Married    Spouse name: Not on file  . Number of children: 2  . Years of education: Not on file  . Highest education level: 12th grade  Occupational History  . Occupation: disabled  Tobacco Use  . Smoking status: Former Smoker    Types: Cigarettes    Quit date: 01/03/1983    Years since quitting: 37.1  . Smokeless tobacco: Never Used  . Tobacco comment: quit in 1984  Vaping Use  . Vaping Use: Never used  Substance and Sexual Activity  . Alcohol use: No    Alcohol/week: 0.0 standard drinks  . Drug use: No  . Sexual activity: Never  Other Topics Concern  . Not on file  Social History Narrative   ** Merged History Encounter **       She is a married mother of 2, grandmother  3. She tries to get exercise but is not doing any routine program.   She quit smoking over 25 years ago does not drink alcohol.   Social Determinants of Health   Financial Resource Strain:   . Difficulty of Paying Living Expenses: Not on file  Food Insecurity:   . Worried About Charity fundraiser in the Last Year: Not on file  . Ran Out of Food in the Last Year: Not on file  Transportation Needs:   . Lack of Transportation (Medical): Not on file  . Lack of Transportation (Non-Medical): Not on file  Physical Activity:   . Days of Exercise per Week: Not on file  . Minutes of Exercise per Session: Not on file  Stress:   . Feeling of Stress : Not on file  Social Connections:   . Frequency of Communication with Friends and Family: Not on file  . Frequency of Social Gatherings with Friends and Family: Not on file  . Attends Religious Services: Not on file  . Active Member of Clubs or Organizations: Not on file  . Attends Archivist Meetings: Not on file  . Marital Status: Not on file  Intimate Partner Violence:   . Fear of Current or Ex-Partner: Not on file  . Emotionally Abused: Not on file  . Physically Abused: Not on file  . Sexually Abused: Not on file    Outpatient Medications Prior to Visit  Medication Sig Dispense Refill  . amLODipine (NORVASC) 5 MG tablet TAKE 1.5 TABLETS (7.5 MG TOTAL) BY MOUTH DAILY. 135 tablet 3  . atorvastatin (LIPITOR) 80 MG tablet Take 1 tablet (80 mg total) by mouth daily. 90 tablet 3  . baclofen (LIORESAL) 10 MG tablet Take 10 mg by mouth 2 (two) times daily as needed for muscle spasms.     Marland Kitchen BOTOX 100 UNITS SOLR injection Inject into the muscle every 3 (three) months.     . chlorproMAZINE (THORAZINE) 25 MG tablet Take 25 mg by mouth 3 (three) times daily as needed for nausea.     . diphenhydramine-acetaminophen (TYLENOL PM) 25-500 MG TABS tablet Take 1 tablet by mouth at bedtime as needed.    Marland Kitchen EPINEPHrine 0.3 mg/0.3 mL IJ SOAJ injection  Inject 0.3 mLs (0.3 mg total) into the muscle as needed for up to 1 dose for anaphylaxis (Call 911 if used.). 1 each 1  . fluocinonide ointment (LIDEX) 5.17 % Apply 1 application topically 2 (two) times  daily. 30 g 1  . fluticasone (FLONASE) 50 MCG/ACT nasal spray Place 2 sprays into both nostrils daily. 16 g 6  . furosemide (LASIX) 20 MG tablet TAKE 1 TABLET BY MOUTH EVERY DAY 90 tablet 1  . hydrocortisone 1 % ointment Apply 1 application topically 2 (two) times daily. To areas of itching not to use if any blisters or open wounds. USE PRN itching. 30 g 0  . lamoTRIgine (LAMICTAL) 100 MG tablet Take 100 mg by mouth 2 (two) times daily.    . Lancets (ONETOUCH ULTRASOFT) lancets Test fasting each morning and 2 hours before supper. Retest if having hypoglycemic symptoms. 100 each 12  . levocetirizine (XYZAL) 5 MG tablet Take 1 tablet by mouth twice daily 60 tablet 5  . metFORMIN (GLUCOPHAGE) 500 MG tablet TAKE 2 TABLETS BY MOUTH IN THE MORNING ,1 TABLET AT LUNCH, & TAKE 2 TABLETS IN THE EVENING 450 tablet 1  . metoprolol tartrate (LOPRESSOR) 100 MG tablet TAKE 1 TABLET BY MOUTH TWICE A DAY 180 tablet 2  . ONETOUCH VERIO test strip TEST FASTING GLUCOSE DAILY AS DIRECTED (Patient taking differently: 1 each by Other route as directed. ) 100 each 3  . TEGRETOL-XR 400 MG 12 hr tablet TAKE 1 TABLET BY MOUTH TWICE A DAY (Patient taking differently: Take 400 mg by mouth 2 (two) times daily. ) 180 tablet 0  . VIIBRYD 40 MG TABS TAKE 1 TABLET BY MOUTH EVERY DAY 90 tablet 1  . aspirin 81 MG EC tablet Take by mouth. (Patient not taking: Reported on 02/05/2020)    . ezetimibe (ZETIA) 10 MG tablet Take 1 tablet (10 mg total) by mouth daily. 90 tablet 3  . predniSONE (STERAPRED UNI-PAK 21 TAB) 10 MG (21) TBPK tablet PO: Take 6 tablets on day 1:Take 5 tablets day 2:Take 4 tablets day 3: Take 3 tablets day 4:Take 2 tablets day five: 5 Take 1 tablet day 6 (Patient not taking: Reported on 02/05/2020) 21 tablet 0  .  valACYclovir (VALTREX) 1000 MG tablet Take 1 tablet (1,000 mg total) by mouth 3 (three) times daily. (Patient not taking: Reported on 02/05/2020) 21 tablet 0   No facility-administered medications prior to visit.    Allergies  Allergen Reactions  . Morphine Rash and Swelling  . Morphine And Related Hives    Nausea and vomiting  Nausea and vomiting  . Oxycodone Hives, Itching, Rash and Swelling  . Oxycodone Hcl Swelling, Hives and Itching  . Aimovig [Erenumab-Aooe]     Questionable lip swelling angoiedema  . Morphine Sulfate Itching and Nausea And Vomiting    REACTION: swelling    Review of Systems  Musculoskeletal: Positive for arthralgias, gait problem and joint swelling. Negative for back pain and neck pain.  Psychiatric/Behavioral: Positive for agitation and dysphoric mood. Negative for confusion, decreased concentration, hallucinations, self-injury, sleep disturbance and suicidal ideas. The patient is nervous/anxious.        Objective:    Physical Exam Constitutional:      General: She is not in acute distress.    Appearance: She is well-developed.  HENT:     Head: Normocephalic and atraumatic.     Right Ear: Hearing and tympanic membrane normal.     Left Ear: Hearing and tympanic membrane normal.     Nose: Nose normal.  Eyes:     General: Lids are normal. No scleral icterus.       Right eye: No discharge.        Left eye: No  discharge.     Conjunctiva/sclera: Conjunctivae normal.  Cardiovascular:     Rate and Rhythm: Normal rate and regular rhythm.     Heart sounds: Normal heart sounds.  Pulmonary:     Effort: Pulmonary effort is normal. No respiratory distress.     Breath sounds: Normal breath sounds.  Abdominal:     General: Bowel sounds are normal.     Palpations: Abdomen is soft.  Musculoskeletal:     Comments: Fair ROM with some crepitus in knees. No significant swelling or locking. Slightly tender to palpate along the joint lines bilaterally. Good pulses.   Skin:    Findings: No lesion or rash.  Neurological:     Mental Status: She is alert and oriented to person, place, and time.  Psychiatric:        Speech: Speech normal.        Behavior: Behavior normal.        Thought Content: Thought content normal.    Diabetic Foot Form - Detailed   Diabetic Foot Exam - detailed Diabetic Foot exam was performed with the following findings: Yes 02/05/2020 10:42 AM  Visual Foot Exam completed.: Yes  Can the patient see the bottom of their feet?: Yes Are the shoes appropriate in style and fit?: Yes Is there swelling or and abnormal foot shape?: Yes Is there a claw toe deformity?: No Is there elevated skin temparature?: No Is there foot or ankle muscle weakness?: No Normal Range of Motion: Yes Pulse Foot Exam completed.: Yes  Right posterior Tibialias: Present Left posterior Tibialias: Present  Right Dorsalis Pedis: Present Left Dorsalis Pedis: Present  Sensory Foot Exam Completed.: Yes Semmes-Weinstein Monofilament Test R Site 1-Great Toe: Pos L Site 1-Great Toe: Pos       Depression screen [facility] 2/9 02/05/2020 05/29/2019 12/12/2018 07/15/2017 02/28/2017  Decreased Interest 3 0 2 1 1   Down, Depressed, Hopeless 2 0 3 2 1   PHQ - 2 Score 5 0 5 3 2   Altered sleeping 0 0 1 0 0  Tired, decreased energy 2 0 2 1 2   Change in appetite 1 0 0 0 0  Feeling bad or failure about yourself  0 0 3 2 0  Trouble concentrating 1 0 0 1 0  Moving slowly or fidgety/restless 0 0 0 0 0  Suicidal thoughts 1 0 0 0 0  PHQ-9 Score 10 0 11 7 4   Difficult doing work/chores Very difficult - Somewhat difficult Somewhat difficult Somewhat difficult  Some recent data might be hidden     BP (!) 162/74 (BP Location: Right Arm, Patient Position: Sitting, Cuff Size: Normal)   Pulse 72   Temp 98.2 F (36.8 C) (Oral)   Wt 225 lb (102.1 kg)   LMP  (LMP Unknown)   SpO2 99%   BMI 40.76 kg/m  Wt Readings from Last 3 Encounters:  02/05/20 225 lb (102.1 kg)  10/11/19 214 lb  (97.1 kg)  08/30/19 212 lb (96.2 kg)    Health Maintenance Due  Topic Date Due  . COVID-19 Vaccine (1) Never done  . PAP SMEAR-Modifier  Never done  . TETANUS/TDAP  10/19/2016  . OPHTHALMOLOGY EXAM  02/11/2017  . MAMMOGRAM  07/30/2019  . INFLUENZA VACCINE  12/02/2019  . HEMOGLOBIN A1C  12/24/2019  . URINE MICROALBUMIN  02/27/2020    There are no preventive care reminders to display for this patient.   Lab Results  Component Value Date   TSH 1.310 06/26/2019   Lab Results  Component  Value Date   WBC 4.9 06/26/2019   HGB 11.5 06/26/2019   HCT 35.3 06/26/2019   MCV 85 06/26/2019   PLT 224 06/26/2019   Lab Results  Component Value Date   NA 145 (H) 10/11/2019   K 4.4 10/11/2019   CO2 22 10/11/2019   GLUCOSE 132 (H) 10/11/2019   BUN 22 10/11/2019   CREATININE 1.33 (H) 10/11/2019   BILITOT <0.2 10/11/2019   ALKPHOS 84 10/11/2019   AST 23 10/11/2019   ALT 28 10/11/2019   PROT 7.4 10/11/2019   ALBUMIN 4.4 10/11/2019   CALCIUM 10.4 (H) 10/11/2019   ANIONGAP 10 02/27/2019   Lab Results  Component Value Date   CHOL 253 (H) 06/26/2019   Lab Results  Component Value Date   HDL 81 06/26/2019   Lab Results  Component Value Date   LDLCALC 148 (H) 06/26/2019   Lab Results  Component Value Date   TRIG 140 06/26/2019   Lab Results  Component Value Date   CHOLHDL 2.8 02/27/2019   Lab Results  Component Value Date   HGBA1C 7.2 (H) 06/26/2019       Assessment & Plan:   1. Chronic pain of both knees Slight worsening of discomfort in both knees. No locking or giving away. Will check x-rays to evaluate suspected arthritis. May use Aleve or Ibuprofen if tolerated. May need referral to orthopedist if no improvement. - DG Knee Complete 4 Views Right - DG Knee Complete 4 Views Left - CBC with Differential/Platelet  2. Depression, major, recurrent, moderate ([place]) PH-Q 2/9 scores show little change. No suicidal ideation. Still taking Viibryd 40 mg qd with fair  results. Will recheck labs and may need to consider psychiatric referral if worsening symptoms. - CBC with Differential/Platelet - Comprehensive metabolic panel - TSH  3. Hypercholesterolemia Tolerating Atorvastatin 80 mg qd. Need to follow low fat diabetic diet and drink more water in diet. Exercise regularly as knees allow. Recheck labs. - CBC with Differential/Platelet - Comprehensive metabolic panel - Lipid panel - TSH  4. Type 2 diabetes mellitus with hyperosmolarity without coma, without long-term current use of insulin (HCC) No hypoglycemic episodes with regular use of Metformin 500 mg 2 breakfast, 1 lunch and 2 supper. Last Hgb A1C was 7.2% on 06-26-19. Encouraged to accomplish ophthalmology exam and completed foot exam today. Recheck follow up labs. - CBC with Differential/Platelet - Comprehensive metabolic panel - Hemoglobin A1c - Lipid panel  5. Need for influenza vaccination - Flu Vaccine QUAD 6+ mos PF IM (Fluarix Quad PF)    No orders of the defined types were placed in this encounter.    Juluis Mire, CMA

## 2020-02-06 LAB — COMPREHENSIVE METABOLIC PANEL
ALT: 37 IU/L — ABNORMAL HIGH (ref 0–32)
AST: 38 IU/L (ref 0–40)
Albumin/Globulin Ratio: 1.4 (ref 1.2–2.2)
Albumin: 4.3 g/dL (ref 3.8–4.9)
Alkaline Phosphatase: 82 IU/L (ref 44–121)
BUN/Creatinine Ratio: 18 (ref 9–23)
BUN: 19 mg/dL (ref 6–24)
Bilirubin Total: <0.2 mg/dL (ref 0.0–1.2)
CO2: 23 mmol/L (ref 20–29)
Calcium: 10.3 mg/dL — ABNORMAL HIGH (ref 8.7–10.2)
Chloride: 103 mmol/L (ref 96–106)
Creatinine, Ser: 1.06 mg/dL — ABNORMAL HIGH (ref 0.57–1.00)
GFR calc Af Amer: 66 mL/min/{1.73_m2} (ref >59)
GFR calc non Af Amer: 58 mL/min/{1.73_m2} — ABNORMAL LOW (ref >59)
Globulin, Total: 3.1 g/dL (ref 1.5–4.5)
Glucose: 179 mg/dL — ABNORMAL HIGH (ref 65–99)
Potassium: 4.6 mmol/L (ref 3.5–5.2)
Sodium: 140 mmol/L (ref 134–144)
Total Protein: 7.4 g/dL (ref 6.0–8.5)

## 2020-02-06 LAB — CBC WITH DIFFERENTIAL/PLATELET
Basophils Absolute: 0.1 10*3/uL (ref 0.0–0.2)
Basos: 1 %
EOS (ABSOLUTE): 0.2 10*3/uL (ref 0.0–0.4)
Eos: 3 %
Hematocrit: 35.4 % (ref 34.0–46.6)
Hemoglobin: 11.7 g/dL (ref 11.1–15.9)
Immature Grans (Abs): 0 10*3/uL (ref 0.0–0.1)
Immature Granulocytes: 1 %
Lymphocytes Absolute: 1.7 10*3/uL (ref 0.7–3.1)
Lymphs: 33 %
MCH: 28.1 pg (ref 26.6–33.0)
MCHC: 33.1 g/dL (ref 31.5–35.7)
MCV: 85 fL (ref 79–97)
Monocytes Absolute: 0.3 10*3/uL (ref 0.1–0.9)
Monocytes: 6 %
Neutrophils Absolute: 3 10*3/uL (ref 1.4–7.0)
Neutrophils: 56 %
Platelets: 238 10*3/uL (ref 150–450)
RBC: 4.16 x10E6/uL (ref 3.77–5.28)
RDW: 14.5 % (ref 11.7–15.4)
WBC: 5.3 10*3/uL (ref 3.4–10.8)

## 2020-02-06 LAB — LIPID PANEL
Chol/HDL Ratio: 3.6 ratio (ref 0.0–4.4)
Cholesterol, Total: 310 mg/dL — ABNORMAL HIGH (ref 100–199)
HDL: 87 mg/dL (ref >39)
LDL Chol Calc (NIH): 198 mg/dL — ABNORMAL HIGH (ref 0–99)
Triglycerides: 139 mg/dL (ref 0–149)
VLDL Cholesterol Cal: 25 mg/dL (ref 5–40)

## 2020-02-06 LAB — TSH: TSH: 1.92 u[IU]/mL (ref 0.450–4.500)

## 2020-02-06 LAB — HEMOGLOBIN A1C
Est. average glucose Bld gHb Est-mCnc: 200 mg/dL
Hgb A1c MFr Bld: 8.6 % — ABNORMAL HIGH (ref 4.8–5.6)

## 2020-02-10 ENCOUNTER — Other Ambulatory Visit: Payer: Self-pay | Admitting: Family Medicine

## 2020-02-10 DIAGNOSIS — J301 Allergic rhinitis due to pollen: Secondary | ICD-10-CM

## 2020-02-10 NOTE — Telephone Encounter (Signed)
Requested Prescriptions  Pending Prescriptions Disp Refills   fluticasone (FLONASE) 50 MCG/ACT nasal spray [Pharmacy Med Name: FLUTICASONE PROP 50 MCG SPRAY] 48 mL 2    Sig: SPRAY 2 SPRAYS INTO EACH NOSTRIL EVERY DAY     Ear, Nose, and Throat: Nasal Preparations - Corticosteroids Passed - 02/10/2020  1:32 AM      Passed - Valid encounter within last 12 months    Recent Outpatient Visits          5 days ago Chronic pain of both knees   [place], [place]   2 months ago Herpes zoster without complication   [facility], Kelby Aline, FNP   5 months ago Angioedema of lips, sequela   [facility], Kelby Aline, FNP   6 months ago Seasonal allergic rhinitis due to pollen   [place], [place]   7 months ago Morbid obesity due to excess calories [facility])   [place], Vickki Muff, [place]      Future Appointments            In 3 months Ernst Bowler, Gwenith Daily, MD [facility] [place]

## 2020-02-11 ENCOUNTER — Ambulatory Visit
Admission: RE | Admit: 2020-02-11 | Discharge: 2020-02-11 | Disposition: A | Discharge status: Home / Self Care | Payer: Medicare Other | Attending: Family Medicine | Admitting: Family Medicine

## 2020-02-11 ENCOUNTER — Other Ambulatory Visit: Payer: Self-pay

## 2020-02-11 ENCOUNTER — Ambulatory Visit
Admission: RE | Admit: 2020-02-11 | Discharge: 2020-02-11 | Disposition: A | Discharge status: Home / Self Care | Payer: Medicare Other | Source: Ambulatory Visit | Attending: Family Medicine | Admitting: Family Medicine

## 2020-02-11 DIAGNOSIS — M1712 Unilateral primary osteoarthritis, left knee: Secondary | ICD-10-CM | POA: Diagnosis not present

## 2020-02-11 DIAGNOSIS — G8929 Other chronic pain: Secondary | ICD-10-CM | POA: Diagnosis not present

## 2020-02-11 DIAGNOSIS — M25562 Pain in left knee: Secondary | ICD-10-CM | POA: Diagnosis not present

## 2020-02-11 DIAGNOSIS — M1711 Unilateral primary osteoarthritis, right knee: Secondary | ICD-10-CM | POA: Diagnosis not present

## 2020-02-11 DIAGNOSIS — M25561 Pain in right knee: Secondary | ICD-10-CM | POA: Insufficient documentation

## 2020-02-11 MED ORDER — ROSUVASTATIN CALCIUM 20 MG PO TABS: 20.0000 mg | ORAL_TABLET | Freq: Every day | ORAL | 3 refills | Status: DC

## 2020-02-13 ENCOUNTER — Other Ambulatory Visit: Payer: Self-pay | Admitting: Cardiovascular Disease

## 2020-02-13 ENCOUNTER — Telehealth: Payer: Self-pay

## 2020-02-13 NOTE — Telephone Encounter (Signed)
Copied from [place] [phone]. Topic: General - Other >> Feb 13, 2020  9:20 AM Leward Quan A wrote: Reason for CRM: Patient called in to inquire of Dr Natale Milch what is the next step because she is still having pains in her knees. Please call  Ph# [phone]

## 2020-02-13 NOTE — Telephone Encounter (Signed)
Rx request sent to pharmacy.  

## 2020-02-14 ENCOUNTER — Other Ambulatory Visit: Payer: Self-pay

## 2020-02-14 ENCOUNTER — Other Ambulatory Visit: Payer: Self-pay | Admitting: Family Medicine

## 2020-02-14 DIAGNOSIS — M170 Bilateral primary osteoarthritis of knee: Secondary | ICD-10-CM

## 2020-02-18 ENCOUNTER — Other Ambulatory Visit: Payer: Self-pay | Admitting: Cardiovascular Disease

## 2020-02-18 DIAGNOSIS — E113393 Type 2 diabetes mellitus with moderate nonproliferative diabetic retinopathy without macular edema, bilateral: Secondary | ICD-10-CM | POA: Diagnosis not present

## 2020-02-18 LAB — HM DIABETES EYE EXAM

## 2020-02-19 DIAGNOSIS — H524 Presbyopia: Secondary | ICD-10-CM | POA: Diagnosis not present

## 2020-02-19 DIAGNOSIS — H5213 Myopia, bilateral: Secondary | ICD-10-CM | POA: Diagnosis not present

## 2020-02-20 NOTE — Progress Notes (Addendum)
Subjective:   Abigail Figueroa is a 60 y.o. female who presents for Medicare Annual (Subsequent) preventive examination.  I connected with Abigail Figueroa today by telephone and verified that I am speaking with the correct person using two identifiers. Location patient: home Location provider: work Persons participating in the virtual visit: patient, provider.   I discussed the limitations, risks, security and privacy concerns of performing an evaluation and management service by telephone and the availability of in person appointments. I also discussed with the patient that there may be a patient responsible charge related to this service. The patient expressed understanding and verbally consented to this telephonic visit.    Interactive audio and video telecommunications were attempted between this provider and patient, however failed, due to patient having technical difficulties OR patient did not have access to video capability.  We continued and completed visit with audio only.   Review of Systems    N/A  Cardiac Risk Factors include: diabetes mellitus;dyslipidemia;hypertension;obesity (BMI >30kg/m2)     Objective:    Today's Vitals   02/21/20 1507  PainSc: 5    There is no height or weight on file to calculate BMI.  Advanced Directives 02/21/2020 03/22/2019 12/12/2018 11/08/2017 08/30/2017 07/15/2017 07/20/2016  Does Patient Have a Medical Advance Directive? Yes No Yes Yes Yes Yes Yes  Type of Paramedic of [place];Living will - [place];Living will Living will Living will [place];Living will Living will;Healthcare Power of Attorney  Does patient want to make changes to medical advance directive? - - - No - Patient declined - - -  Copy of [place] in Chart? No - copy requested - No - copy requested - - No - copy requested No - copy requested  Would patient like information on creating a medical  advance directive? - No - Patient declined - - - - -  Some encounter information is confidential and restricted. Go to Review Flowsheets activity to see all data.    Current Medications (verified) Outpatient Encounter Medications as of 02/21/2020  Medication Sig   amLODipine (NORVASC) 5 MG tablet TAKE 1.5 TABLETS (7.5 MG TOTAL) BY MOUTH DAILY.   baclofen (LIORESAL) 10 MG tablet Take 10 mg by mouth 2 (two) times daily as needed for muscle spasms.    BOTOX 100 UNITS SOLR injection Inject into the muscle every 3 (three) months.    chlorproMAZINE (THORAZINE) 25 MG tablet Take 25 mg by mouth 3 (three) times daily as needed for nausea.    clindamycin (CLEOCIN T) 1 % lotion APPLY TOPICALLY ONCE A DAY TO AREAS OF ACNE ON THE FACE   diphenhydramine-acetaminophen (TYLENOL PM) 25-500 MG TABS tablet Take 1 tablet by mouth at bedtime as needed.   EMGALITY 120 MG/ML SOAJ Inject into the skin as needed.    EPINEPHrine 0.3 mg/0.3 mL IJ SOAJ injection Inject 0.3 mLs (0.3 mg total) into the muscle as needed for up to 1 dose for anaphylaxis (Call 911 if used.).   ezetimibe (ZETIA) 10 MG tablet Take 1 tablet (10 mg total) by mouth daily. Please schedule appointment with Dr. Claiborne Billings for refills.   fluocinonide ointment (LIDEX) 7.06 % Apply 1 application topically 2 (two) times daily.   fluticasone (FLONASE) 50 MCG/ACT nasal spray SPRAY 2 SPRAYS INTO EACH NOSTRIL EVERY DAY   furosemide (LASIX) 20 MG tablet TAKE 1 TABLET BY MOUTH EVERY DAY   hydrocortisone 1 % ointment Apply 1 application topically 2 (two) times daily.  To areas of itching not to use if any blisters or open wounds. USE PRN itching.   lamoTRIgine (LAMICTAL) 100 MG tablet Take 100 mg by mouth 2 (two) times daily.   Lancets (ONETOUCH ULTRASOFT) lancets Test fasting each morning and 2 hours before supper. Retest if having hypoglycemic symptoms.   levocetirizine (XYZAL) 5 MG tablet Take 1 tablet by mouth twice daily   metFORMIN (GLUCOPHAGE) 500 MG tablet  TAKE 2 TABLETS BY MOUTH IN THE MORNING ,1 TABLET AT LUNCH, & TAKE 2 TABLETS IN THE EVENING   metoprolol tartrate (LOPRESSOR) 100 MG tablet TAKE 1 TABLET BY MOUTH TWICE A DAY   ONETOUCH VERIO test strip TEST FASTING GLUCOSE DAILY AS DIRECTED (Patient taking differently: 1 each by Other route as directed. )   promethazine (PHENERGAN) 25 MG tablet Take 25 mg by mouth every 4 (four) hours as needed.    rosuvastatin (CRESTOR) 20 MG tablet Take 1 tablet (20 mg total) by mouth daily.   TEGRETOL-XR 400 MG 12 hr tablet TAKE 1 TABLET BY MOUTH TWICE A DAY (Patient taking differently: Take 400 mg by mouth 2 (two) times daily. )   VIIBRYD 40 MG TABS TAKE 1 TABLET BY MOUTH EVERY DAY   aspirin 81 MG EC tablet Take by mouth. (Patient not taking: Reported on 02/05/2020)   predniSONE (STERAPRED UNI-PAK 21 TAB) 10 MG (21) TBPK tablet PO: Take 6 tablets on day 1:Take 5 tablets day 2:Take 4 tablets day 3: Take 3 tablets day 4:Take 2 tablets day five: 5 Take 1 tablet day 6 (Patient not taking: Reported on 02/05/2020)   valACYclovir (VALTREX) 1000 MG tablet Take 1 tablet (1,000 mg total) by mouth 3 (three) times daily. (Patient not taking: Reported on 02/05/2020)   No facility-administered encounter medications on file as of 02/21/2020.    Allergies (verified) Morphine, Morphine and related, Oxycodone, Oxycodone hcl, Aimovig [erenumab-aooe], and Morphine sulfate   History: Past Medical History:  Diagnosis Date   Abnormal stress test    a. 10/2009: Normal myocardial perfusion imaging; b. 08/2015 MV: medium defect of mod severity in mid ant apical region w/ mild HK of the distal inf wall;  c. 08/2015 Cath: nl Cors, EF 60%.   Anemia    Angio-edema    Anxiety    Arthritis    Bell's palsy    Depression    Edema    Essential hypertension    Frequent urination    Frequent urination at night    Headache    migraines, gets botox injections   Heart palpitations June 2013   Event monitor showing sinus tachycardia, PACs  with couplets and triplets.   Hx of echocardiogram    a. 10/2009 Echo: showed normal left ventricular function, mild left ventricular hypertrophy, and no significant valve abnormalities.   Hypercholesterolemia    Migraine    Morbid obesity ([place])    Neuromuscular disorder ([place])    OSA (obstructive sleep apnea)    has been on continuous positive airway pressure   Seizure disorder ([place])    Seizures ([place])    Type II diabetes mellitus ([place])    Urticaria    Past Surgical History:  Procedure Laterality Date   ABDOMINAL HYSTERECTOMY  1990   without BSO   BACK SURGERY  1900s 2001   x2 with "cage put in"   [place] N/A 08/06/2015   Procedure: Left Heart Cath and Coronary Angiography;  Surgeon: Troy Sine, MD;  Location: [facility]  INVASIVE CV LAB;  Service: Cardiovascular;  Laterality: N/A;   CARPAL TUNNEL RELEASE Bilateral    CHOLECYSTECTOMY  1980   COLONOSCOPY WITH PROPOFOL N/A 08/30/2017   Procedure: COLONOSCOPY WITH PROPOFOL;  Surgeon: Lin Landsman, MD;  Location: [facility] ENDOSCOPY;  Service: Endoscopy;  Laterality: N/A;   DG THUMB LEFT HAND  01/24/2018   repair joint in left thumb   DORSAL COMPARTMENT RELEASE Left 10/25/2014   Procedure: LEFT FIRST  DORSAL COMPARTMENT RELEASE AND RADIAL TENOSYNOVECTOMY ;  Surgeon: Roseanne Kaufman, MD;  Location: [place];  Service: Orthopedics;  Laterality: Left;   HAND SURGERY     KNEE SURGERY Right 2012   meniscus tear   KNEE SURGERY  2012   LAMINECTOMY  1995   TUBAL LIGATION  1982   WRIST SURGERY Right    Family History  Problem Relation Age of Onset   Sarcoidosis Mother    Seizures Mother    Heart attack Mother    Alzheimer's disease Maternal Grandmother    Dementia Maternal Grandmother    Hypertension Maternal Grandfather    Diabetes Maternal Grandfather    Breast cancer Neg Hx    Allergic rhinitis Neg Hx    Asthma Neg Hx    Eczema Neg Hx    Urticaria Neg Hx     Social History   Socioeconomic History   Marital status: Married    Spouse name: Not on file   Number of children: 2   Years of education: Not on file   Highest education level: 12th grade  Occupational History   Occupation: disabled  Tobacco Use   Smoking status: Former Smoker    Types: Cigarettes    Quit date: 01/03/1983    Years since quitting: 37.1   Smokeless tobacco: Never Used   Tobacco comment: quit in 1984  Vaping Use   Vaping Use: Never used  Substance and Sexual Activity   Alcohol use: No    Alcohol/week: 0.0 standard drinks   Drug use: No   Sexual activity: Never  Other Topics Concern   Not on file  Social History Narrative   ** Merged History Encounter **       She is a married mother of 2, grandmother 3. She tries to get exercise but is not doing any routine program.   She quit smoking over 25 years ago does not drink alcohol.   Social Determinants of Health   Financial Resource Strain: Medium Risk   Difficulty of Paying Living Expenses: Somewhat hard  Food Insecurity: No Food Insecurity   Worried About Charity fundraiser in the Last Year: Never true   Ran Out of Food in the Last Year: Never true  Transportation Needs: No Transportation Needs   Lack of Transportation (Medical): No   Lack of Transportation (Non-Medical): No  Physical Activity: Inactive   Days of Exercise per Week: 0 days   Minutes of Exercise per Session: 0 min  Stress: Stress Concern Present   Feeling of Stress : To some extent  Social Connections: Moderately Integrated   Frequency of Communication with Friends and Family: More than three times a week   Frequency of Social Gatherings with Friends and Family: More than three times a week   Attends Religious Services: Never   Marine scientist or Organizations: Yes   Attends Music therapist: More than 4 times per year   Marital Status: Married    Tobacco Counseling Counseling given: Not Answered Comment: quit  in 1984   Clinical Intake:  Pre-visit preparation completed: Yes  Pain : 0-10 Pain Score: 5  Pain Type: Acute pain Pain Location: Knee Pain Orientation: Left, Right Pain Descriptors / Indicators: Aching, Radiating Pain Frequency: Intermittent Pain Relieving Factors: Uses a heating pad for pain.  Pain Relieving Factors: Uses a heating pad for pain.  Nutritional Risks: Nausea/ vomitting/ diarrhea (Diarrhea occasionally due to Metformin.) Diabetes: Yes  How often do you need to have someone help you when you read instructions, pamphlets, or other written materials from your doctor or pharmacy?: 1 - Never  Diabetic? Yes  Nutrition Risk Assessment:  Has the patient had any N/V/D within the last 2 months?  No  Does the patient have any non-healing wounds?  No  Has the patient had any unintentional weight loss or weight gain?  No   Diabetes:  Is the patient diabetic?  Yes  If diabetic, was a CBG obtained today?  No  Did the patient bring in their glucometer from home?  No  How often do you monitor your CBG's? Does not check regularly.   Financial Strains and Diabetes Management:  Are you having any financial strains with the device, your supplies or your medication? No .  Does the patient want to be seen by Chronic Care Management for management of their diabetes?  No  Would the patient like to be referred to a Nutritionist or for Diabetic Management?  No   Diabetic Exams:   Diabetic Eye Exam: Completed 02/18/20. Diabetic Foot Exam: Completed 02/05/20.   Interpreter Needed?: No  Information entered by :: [facility], LPN   Activities of Daily Living In your present state of health, do you have any difficulty performing the following activities: 02/21/2020 02/05/2020  Hearing? Y N  Comment Does not wear hearing aids. -  Vision? N N  Difficulty concentrating or making decisions? Y N  Walking or climbing stairs? N N  Dressing or bathing? Y N  Comment Needs assistance  putting on shoes and getting in the bath tub. -  Doing errands, shopping? N N  Preparing Food and eating ? N -  Using the Toilet? N -  In the past six months, have you accidently leaked urine? Y -  Comment Occsasionally, wears protection daily. -  Do you have problems with loss of bowel control? Y -  Comment Occsasionally, wears protection daily. -  Managing your Medications? N -  Managing your Finances? N -  Housekeeping or managing your Housekeeping? N -  Some recent data might be hidden    Patient Care Team: Chrismon, Vickki Muff, PA as PCP - General (Physician Assistant) Troy Sine, MD as PCP - Cardiology (Cardiology) Orie Rout, MD as Referring Physician (Specialist) Newman Pies, MD as Consulting Physician (Neurosurgery) Estill Cotta, MD as Consulting Physician (Ophthalmology) Ernst Bowler Gwenith Daily, MD as Consulting Physician (Allergy and Immunology)  Indicate any recent Medical Services you may have received from other than [facility] providers in the past year (date may be approximate).     Assessment:   This is a routine wellness examination for Abigail Figueroa.  Hearing/Vision screen No exam data present  Dietary issues and exercise activities discussed: Current Exercise Habits: The patient does not participate in regular exercise at present, Exercise limited by: orthopedic condition(s)  Goals      Increase water intake     Recommend to continue drinking 40 oz of water a day.     LIFESTYLE - DECREASE FALLS RISK  Recommend to remove any items from the home that may cause slips or trips.       Depression Screen PHQ 2/9 Scores 02/05/2020 05/29/2019 03/20/2019 12/12/2018 07/15/2017 02/28/2017 07/20/2016  PHQ - 2 Score 5 0 - 5 3 2 2   PHQ- 9 Score 10 0 - 11 7 4 7   Exception Documentation - - Patient refusal - - - -    Fall Risk Fall Risk  02/21/2020 02/05/2020 03/20/2019 12/12/2018 07/15/2017  Falls in the past year? 1 1 0 1 No  Number falls in past yr: 1 1 0 1  -  Injury with Fall? 0 0 0 0 -  Risk for fall due to : Impaired balance/gait - - - -  Follow up Falls prevention discussed - Falls evaluation completed Falls prevention discussed -    Any stairs in or around the home? Yes  If so, are there any without handrails? No  Home free of loose throw rugs in walkways, pet beds, electrical cords, etc? Yes  Adequate lighting in your home to reduce risk of falls? Yes   ASSISTIVE DEVICES UTILIZED TO PREVENT FALLS:  Life alert? No  Use of a cane, walker or w/c? No  Grab bars in the bathroom? Yes  Shower chair or bench in shower? No  Elevated toilet seat or a handicapped toilet? No    Cognitive Function:     6CIT Screen 02/21/2020 12/12/2018 07/20/2016  What Year? 0 points 0 points 0 points  What month? 0 points 0 points 0 points  What time? 0 points 0 points 0 points  Count back from 20 0 points 0 points 0 points  Months in reverse 0 points 0 points 0 points  Repeat phrase 0 points 0 points 10 points  Total Score 0 0 10    Immunizations Immunization History  Administered Date(s) Administered   Influenza,inj,Quad PF,6+ Mos 02/28/2017, 01/17/2018, 02/06/2019, 02/05/2020   PFIZER SARS-COV-2 Vaccination 07/20/2019, 08/10/2019   Pneumococcal Polysaccharide-23 01/17/2018   Tdap 10/20/2006    TDAP status: Due, Education has been provided regarding the importance of this vaccine. Advised may receive this vaccine at local pharmacy or Health Dept. Aware to provide a copy of the vaccination record if obtained from local pharmacy or Health Dept. Verbalized acceptance and understanding. Flu Vaccine status: Up to date Covid-19 vaccine status: Completed vaccines  Qualifies for Shingles Vaccine? Yes   Zostavax completed No   Shingrix Completed?: No.    Education has been provided regarding the importance of this vaccine. Patient has been advised to call insurance company to determine out of pocket expense if they have not yet received this vaccine.  Advised may also receive vaccine at local pharmacy or Health Dept. Verbalized acceptance and understanding.  Screening Tests Health Maintenance  Topic Date Due   MAMMOGRAM  07/30/2019   URINE MICROALBUMIN  02/27/2020   TETANUS/TDAP  02/20/2021 (Originally 10/19/2016)   HEMOGLOBIN A1C  08/05/2020   FOOT EXAM  02/04/2021   OPHTHALMOLOGY EXAM  02/17/2021   COLONOSCOPY  08/31/2022   INFLUENZA VACCINE  Completed   PNEUMOCOCCAL POLYSACCHARIDE VACCINE AGE 12-64 HIGH RISK  Completed   COVID-19 Vaccine  Completed   Hepatitis C Screening  Completed   HIV Screening  Completed    Health Maintenance  Health Maintenance Due  Topic Date Due   MAMMOGRAM  07/30/2019   URINE MICROALBUMIN  02/27/2020    Colorectal cancer screening: Completed 08/30/17. Repeat every 5 years Mammogram status: Ordered 05/15/19. Pt provided with contact info and  advised to call to schedule appt.    Lung Cancer Screening: (Low Dose CT Chest recommended if Age 4-80 years, 30 pack-year currently smoking OR have quit w/in 15years.) does not qualify.   Additional Screening:  Hepatitis C Screening: Up to date  Vision Screening: Recommended annual ophthalmology exams for early detection of glaucoma and other disorders of the eye. Is the patient up to date with their annual eye exam?  Yes  Who is the provider or what is the name of the office in which the patient attends annual eye exams? Dr Dingeldein @ [place] If pt is not established with a provider, would they like to be referred to a provider to establish care? No .   Dental Screening: Recommended annual dental exams for proper oral hygiene  Community Resource Referral / Chronic Care Management: CRR required this visit?  No   CCM required this visit?  No      Plan:     I have personally reviewed and noted the following in the patient's chart:   Medical and social history Use of alcohol, tobacco or illicit drugs  Current medications and supplements Functional  ability and status Nutritional status Physical activity Advanced directives List of other physicians Hospitalizations, surgeries, and ER visits in previous 12 months Vitals Screenings to include cognitive, depression, and falls Referrals and appointments  In addition, I have reviewed and discussed with patient certain preventive protocols, quality metrics, and best practice recommendations. A written personalized care plan for preventive services as well as general preventive health recommendations were provided to patient.     Abigail Figueroa, [place]   02/16/5101   Nurse Notes: Pt needs a urine check at next in office apt. Pt advised to call and schedule mammogram apt.   Reviewed note and plan of Nurse Health Advisor. Agree with documentation and recommendations.

## 2020-02-21 ENCOUNTER — Other Ambulatory Visit: Payer: Self-pay

## 2020-02-21 ENCOUNTER — Ambulatory Visit (INDEPENDENT_AMBULATORY_CARE_PROVIDER_SITE_OTHER): Payer: Medicare Other

## 2020-02-21 DIAGNOSIS — Z0000 Encounter for general adult medical examination without abnormal findings: Secondary | ICD-10-CM

## 2020-02-21 NOTE — Patient Instructions (Addendum)
Abigail Figueroa , Thank you for taking time to come for your Medicare Wellness Visit. I appreciate your ongoing commitment to your health goals. Please review the following plan we discussed and let me know if I can assist you in the future.   Screening recommendations/referrals: Colonoscopy: Up to date, due 08/2022 Mammogram: Currently due. Order on file. Pt to call and schedule apt. Recommended yearly ophthalmology/optometry visit for glaucoma screening and checkup Recommended yearly dental visit for hygiene and checkup  Vaccinations: Influenza vaccine: Done 02/05/20 Tdap vaccine: Currenlty due, declined at this time.  Shingles vaccine: Shingrix discussed. Please contact your pharmacy for coverage information.     Advanced directives: Please bring a copy of your POA (Power of Attorney) and/or Living Will to your next appointment.   Conditions/risks identified: Fall risk preventative discussed today. Continue to increase water intake to 6-8 8 oz glasses a day.   Next appointment: 05/13/20 @ 9:40 AM with [place] 65 Years and Older, Female Preventive care refers to lifestyle choices and visits with your health care provider that can promote health and wellness. What does preventive care include?  A yearly physical exam. This is also called an annual well check.  Dental exams once or twice a year.  Routine eye exams. Ask your health care provider how often you should have your eyes checked.  Personal lifestyle choices, including:  Daily care of your teeth and gums.  Regular physical activity.  Eating a healthy diet.  Avoiding tobacco and drug use.  Limiting alcohol use.  Practicing safe sex.  Taking low-dose aspirin every day.  Taking vitamin and mineral supplements as recommended by your health care provider. What happens during an annual well check? The services and screenings done by your health care provider during your annual well check will  depend on your age, overall health, lifestyle risk factors, and family history of disease. Counseling  Your health care provider may ask you questions about your:  Alcohol use.  Tobacco use.  Drug use.  Emotional well-being.  Home and relationship well-being.  Sexual activity.  Eating habits.  History of falls.  Memory and ability to understand (cognition).  Work and work Statistician.  Reproductive health. Screening  You may have the following tests or measurements:  Height, weight, and BMI.  Blood pressure.  Lipid and cholesterol levels. These may be checked every 5 years, or more frequently if you are over 35 years old.  Skin check.  Lung cancer screening. You may have this screening every year starting at age 76 if you have a 30-pack-year history of smoking and currently smoke or have quit within the past 15 years.  Fecal occult blood test (FOBT) of the stool. You may have this test every year starting at age 74.  Flexible sigmoidoscopy or colonoscopy. You may have a sigmoidoscopy every 5 years or a colonoscopy every 10 years starting at age 37.  Hepatitis C blood test.  Hepatitis B blood test.  Sexually transmitted disease (STD) testing.  Diabetes screening. This is done by checking your blood sugar (glucose) after you have not eaten for a while (fasting). You may have this done every 1-3 years.  Bone density scan. This is done to screen for osteoporosis. You may have this done starting at age 80.  Mammogram. This may be done every 1-2 years. Talk to your health care provider about how often you should have regular mammograms. Talk with your health care provider about your test results,  treatment options, and if necessary, the need for more tests. Vaccines  Your health care provider may recommend certain vaccines, such as:  Influenza vaccine. This is recommended every year.  Tetanus, diphtheria, and acellular pertussis (Tdap, Td) vaccine. You may need a  Td booster every 10 years.  Zoster vaccine. You may need this after age 52.  Pneumococcal 13-valent conjugate (PCV13) vaccine. One dose is recommended after age 68.  Pneumococcal polysaccharide (PPSV23) vaccine. One dose is recommended after age 14. Talk to your health care provider about which screenings and vaccines you need and how often you need them. This information is not intended to replace advice given to you by your health care provider. Make sure you discuss any questions you have with your health care provider. Document Released: 05/16/2015 Document Revised: 01/07/2016 Document Reviewed: 02/18/2015 Elsevier Interactive Patient Education  2017 [employer] Prevention in the Home Falls can cause injuries. They can happen to people of all ages. There are many things you can do to make your home safe and to help prevent falls. What can I do on the outside of my home?  Regularly fix the edges of walkways and driveways and fix any cracks.  Remove anything that might make you trip as you walk through a door, such as a raised step or threshold.  Trim any bushes or trees on the path to your home.  Use bright outdoor lighting.  Clear any walking paths of anything that might make someone trip, such as rocks or tools.  Regularly check to see if handrails are loose or broken. Make sure that both sides of any steps have handrails.  Any raised decks and porches should have guardrails on the edges.  Have any leaves, snow, or ice cleared regularly.  Use sand or salt on walking paths during winter.  Clean up any spills in your garage right away. This includes oil or grease spills. What can I do in the bathroom?  Use night lights.  Install grab bars by the toilet and in the tub and shower. Do not use towel bars as grab bars.  Use non-skid mats or decals in the tub or shower.  If you need to sit down in the shower, use a plastic, non-slip stool.  Keep the floor dry. Clean  up any water that spills on the floor as soon as it happens.  Remove soap buildup in the tub or shower regularly.  Attach bath mats securely with double-sided non-slip rug tape.  Do not have throw rugs and other things on the floor that can make you trip. What can I do in the bedroom?  Use night lights.  Make sure that you have a light by your bed that is easy to reach.  Do not use any sheets or blankets that are too big for your bed. They should not hang down onto the floor.  Have a firm chair that has side arms. You can use this for support while you get dressed.  Do not have throw rugs and other things on the floor that can make you trip. What can I do in the kitchen?  Clean up any spills right away.  Avoid walking on wet floors.  Keep items that you use a lot in easy-to-reach places.  If you need to reach something above you, use a strong step stool that has a grab bar.  Keep electrical cords out of the way.  Do not use floor polish or wax that makes floors  slippery. If you must use wax, use non-skid floor wax.  Do not have throw rugs and other things on the floor that can make you trip. What can I do with my stairs?  Do not leave any items on the stairs.  Make sure that there are handrails on both sides of the stairs and use them. Fix handrails that are broken or loose. Make sure that handrails are as long as the stairways.  Check any carpeting to make sure that it is firmly attached to the stairs. Fix any carpet that is loose or worn.  Avoid having throw rugs at the top or bottom of the stairs. If you do have throw rugs, attach them to the floor with carpet tape.  Make sure that you have a light switch at the top of the stairs and the bottom of the stairs. If you do not have them, ask someone to add them for you. What else can I do to help prevent falls?  Wear shoes that:  Do not have high heels.  Have rubber bottoms.  Are comfortable and fit you well.  Are  closed at the toe. Do not wear sandals.  If you use a stepladder:  Make sure that it is fully opened. Do not climb a closed stepladder.  Make sure that both sides of the stepladder are locked into place.  Ask someone to hold it for you, if possible.  Clearly mark and make sure that you can see:  Any grab bars or handrails.  First and last steps.  Where the edge of each step is.  Use tools that help you move around (mobility aids) if they are needed. These include:  Canes.  Walkers.  Scooters.  Crutches.  Turn on the lights when you go into a dark area. Replace any light bulbs as soon as they burn out.  Set up your furniture so you have a clear path. Avoid moving your furniture around.  If any of your floors are uneven, fix them.  If there are any pets around you, be aware of where they are.  Review your medicines with your doctor. Some medicines can make you feel dizzy. This can increase your chance of falling. Ask your doctor what other things that you can do to help prevent falls. This information is not intended to replace advice given to you by your health care provider. Make sure you discuss any questions you have with your health care provider. Document Released: 02/13/2009 Document Revised: 09/25/2015 Document Reviewed: 05/24/2014 Elsevier Interactive Patient Education  2017 [employer].

## 2020-02-22 ENCOUNTER — Encounter: Payer: Self-pay | Admitting: *Deleted

## 2020-02-26 ENCOUNTER — Telehealth: Payer: Self-pay

## 2020-02-26 ENCOUNTER — Ambulatory Visit: Payer: Self-pay

## 2020-02-26 DIAGNOSIS — M170 Bilateral primary osteoarthritis of knee: Secondary | ICD-10-CM | POA: Diagnosis not present

## 2020-02-26 NOTE — Telephone Encounter (Signed)
Patient called stating that she was in to see [name] and they talked about her anxiety issues. She states that he was waiting for her labs to return.  Today she called stating that she feels anxious.  She states she is shaky feeling and feels her heart flutter.  She states these symptoms started 1-2 months ago. She says that some days she is so anxious she can't get things accomplished but it is intermittent with good days.  She denies suicidal and homocidal thoughts. She denies other symptoms. She denies triggers.  Per protocol she needs appointment. Per patient request appointment was scheduled virtual. Care advice was read to patient.  She verbalized understanding of all inflrmation.  Reason for Disposition . [1] Symptoms of anxiety or panic AND [2] has not been evaluated for this by physician  Answer Assessment - Initial Assessment Questions 1. CONCERN: "What happened that made you call today?"     anxiousness 2. ANXIETY SYMPTOM SCREENING: "Can you describe how you have been feeling?"  (e.g., tense, restless, panicky, anxious, keyed up, trouble sleeping, trouble concentrating)    Shakey,  3. ONSET: "How long have you been feeling this way?"     One month or two 4. RECURRENT: "Have you felt this way before?"  If Yes, ask: "What happened that time?" "What helped these feelings go away in the past?"     No 5. RISK OF HARM - SUICIDAL IDEATION:  "Do you ever have thoughts of hurting or killing yourself?"  (e.g., yes, no, no but preoccupation with thoughts about death)   - INTENT:  "Do you have thoughts of hurting or killing yourself right NOW?" (e.g., yes, no, N/A)   - PLAN: "Do you have a specific plan for how you would do this?" (e.g., gun, knife, overdose, no plan, N/A)     no 6. RISK OF HARM - HOMICIDAL IDEATION:  "Do you ever have thoughts of hurting or killing someone else?"  (e.g., yes, no, no but preoccupation with thoughts about death)   - INTENT:  "Do you have thoughts of hurting or  killing someone right NOW?" (e.g., yes, no, N/A)   - PLAN: "Do you have a specific plan for how you would do this?" (e.g., gun, knife, no plan, N/A)      No 7. FUNCTIONAL IMPAIRMENT: "How have things been going for you overall? Have you had more difficulty than usual doing your normal daily activities?"  (e.g., better, same, worse; self-care, school, work, interactions)     Some days and others ar ok getting wworse 8. SUPPORT: "Who is with you now?" "Who do you live with?" "Do you have family or friends who you can talk to?"      Family and friends Lives with husband and daughter 41. THERAPIST: "Do you have a counselor or therapist? Name?"     no 10. STRESSORS: "Has there been any new stress or recent changes in your life?"      A lot of death in family 53. CAFFEINE USE: "Do you drink caffeinated beverages, and how much each day?" (e.g., coffee, tea, colas)       Coffee  Daily I morning 2 cups per day 12. ALCOHOL USE OR SUBSTANCE USE (DRUG USE): "Do you drink alcohol or use any illegal drugs?"       no 13. OTHER SYMPTOMS: "Do you have any other physical symptoms right now?" (e.g., chest pain, palpitations, difficulty breathing, fever)       Fluttering heart, 14. PREGNANCY: "Is  there any chance you are pregnant?" "When was your last menstrual period?"       No  Protocols used: ANXIETY AND PANIC ATTACK-A-AH

## 2020-02-26 NOTE — Telephone Encounter (Signed)
Copied from [place] [id number]. Topic: General - Other >> Feb 26, 2020  2:53 PM Oneta Rack wrote: Reason for CRM: patient states she received her lab results and she was inquiring about the next plan of action regarding her anxiety. Please advise

## 2020-02-26 NOTE — Telephone Encounter (Signed)
Keep virtual appointment as scheduled for 02-28-20.

## 2020-02-27 NOTE — Progress Notes (Signed)
MyChart Video Visit    Virtual Visit via Video Note   This visit type was conducted due to national recommendations for restrictions regarding the COVID-19 Pandemic (e.g. social distancing) in an effort to limit this patient's exposure and mitigate transmission in our community. This patient is at least at moderate risk for complications without adequate follow up. This format is felt to be most appropriate for this patient at this time. Physical exam was limited by quality of the video and audio technology used for the visit.   Patient location: home Provider location: office  I discussed the limitations of evaluation and management by telemedicine and the availability of in person appointments. The patient expressed understanding and agreed to proceed.  Patient: Abigail Figueroa   DOB: 1960/11/05   59 y.o. Female  MRN: [id number] Visit Date: 02/28/2020  Today's healthcare provider: Vernie Murders, PA   No chief complaint on file.  Subjective    HPI   The patient is a 59 year old female who presents for via video visit for follow up.   She was last seen on 10/025/2021.  At that time she was placed on Crestor for her cholesterol treatment.  The patient states that she has been on Crestor before and did not tolerate it.  She said it made her feel bad.  She states she went ahead and got it filled and has been taking the medicine but has had the same side effects.    Emotionally she is feeling a little better the last day or two.  However she still has days where she feels bad.  She continues with her medications.   Past Medical History:  Diagnosis Date  . Abnormal stress test    a. 10/2009: Normal myocardial perfusion imaging; b. 08/2015 MV: medium defect of mod severity in mid ant apical region w/ mild HK of the distal inf wall;  c. 08/2015 Cath: nl Cors, EF 60%.  . Anemia   . Angio-edema   . Anxiety   . Arthritis   . Bell's palsy   . Depression   . Edema   . Essential  hypertension   . Frequent urination   . Frequent urination at night   . Headache    migraines, gets botox injections  . Heart palpitations June 2013   Event monitor showing sinus tachycardia, PACs with couplets and triplets.  Marland Kitchen Hx of echocardiogram    a. 10/2009 Echo: showed normal left ventricular function, mild left ventricular hypertrophy, and no significant valve abnormalities.  . Hypercholesterolemia   . Migraine   . Morbid obesity ([place])   . Neuromuscular disorder ([place])   . OSA (obstructive sleep apnea)    has been on continuous positive airway pressure  . Seizure disorder ([place])   . Seizures ([place])   . Type II diabetes mellitus ([place])   . Urticaria    Past Surgical History:  Procedure Laterality Date  . ABDOMINAL HYSTERECTOMY  1990   without BSO  . BACK SURGERY  1900s 2001   x2 with "cage put in"  . BILATERAL SALPINGOOPHORECTOMY  1990  . CARDIAC CATHETERIZATION N/A 08/06/2015   Procedure: Left Heart Cath and Coronary Angiography;  Surgeon: Troy Sine, MD;  Location: [facility];  Service: Cardiovascular;  Laterality: N/A;  . CARPAL TUNNEL RELEASE Bilateral   . CHOLECYSTECTOMY  1980  . COLONOSCOPY WITH PROPOFOL N/A 08/30/2017   Procedure: COLONOSCOPY WITH PROPOFOL;  Surgeon: Lin Landsman, MD;  Location: [facility];  Service: Endoscopy;  Laterality: N/A;  . DG THUMB LEFT HAND  01/24/2018   repair joint in left thumb  . DORSAL COMPARTMENT RELEASE Left 10/25/2014   Procedure: LEFT FIRST  DORSAL COMPARTMENT RELEASE AND RADIAL TENOSYNOVECTOMY ;  Surgeon: Roseanne Kaufman, MD;  Location: [facility];  Service: Orthopedics;  Laterality: Left;  . HAND SURGERY    . KNEE SURGERY Right 2012   meniscus tear  . KNEE SURGERY  2012  . LAMINECTOMY  1995  . TUBAL LIGATION  1982  . WRIST SURGERY Right    Social History   Tobacco Use  . Smoking status: Former Smoker    Types: Cigarettes    Quit date: 01/03/1983    Years since quitting: 37.1  . Smokeless  tobacco: Never Used  . Tobacco comment: quit in 1984  Vaping Use  . Vaping Use: Never used  Substance Use Topics  . Alcohol use: No    Alcohol/week: 0.0 standard drinks  . Drug use: No   Family Status  Relation Name Status  . Mother  Deceased  . MGM  Deceased  . MGF  Alive  . PGM  Deceased  . Daughter 2 Alive  . Father  Deceased  . Brother 1 Alive  . PGF  Deceased  . Brother 2 Alive  . Brother  Deceased  . Neg Hx  (Not Specified)   Allergies  Allergen Reactions  . Morphine Rash and Swelling  . Morphine And Related Hives    Nausea and vomiting  Nausea and vomiting  . Oxycodone Hives, Itching, Rash and Swelling  . Oxycodone Hcl Swelling, Hives and Itching  . Aimovig [Erenumab-Aooe]     Questionable lip swelling angoiedema  . Morphine Sulfate Itching and Nausea And Vomiting    REACTION: swelling      Medications: Outpatient Medications Prior to Visit  Medication Sig  . amLODipine (NORVASC) 5 MG tablet TAKE 1.5 TABLETS (7.5 MG TOTAL) BY MOUTH DAILY.  Marland Kitchen aspirin 81 MG EC tablet Take by mouth.   . baclofen (LIORESAL) 10 MG tablet Take 10 mg by mouth 2 (two) times daily as needed for muscle spasms.   Marland Kitchen BOTOX 100 UNITS SOLR injection Inject into the muscle every 3 (three) months.   . chlorproMAZINE (THORAZINE) 25 MG tablet Take 25 mg by mouth 3 (three) times daily as needed for nausea.   . clindamycin (CLEOCIN T) 1 % lotion APPLY TOPICALLY ONCE A DAY TO AREAS OF ACNE ON THE FACE  . diphenhydramine-acetaminophen (TYLENOL PM) 25-500 MG TABS tablet Take 1 tablet by mouth at bedtime as needed.  Marland Kitchen EMGALITY 120 MG/ML SOAJ Inject into the skin as needed.   Marland Kitchen EPINEPHrine 0.3 mg/0.3 mL IJ SOAJ injection Inject 0.3 mLs (0.3 mg total) into the muscle as needed for up to 1 dose for anaphylaxis (Call 911 if used.).  Marland Kitchen ezetimibe (ZETIA) 10 MG tablet Take 1 tablet (10 mg total) by mouth daily. Please schedule appointment with Dr. Claiborne Billings for refills.  . fluocinonide ointment (LIDEX) 4.01 %  Apply 1 application topically 2 (two) times daily.  . fluticasone (FLONASE) 50 MCG/ACT nasal spray SPRAY 2 SPRAYS INTO EACH NOSTRIL EVERY DAY  . furosemide (LASIX) 20 MG tablet TAKE 1 TABLET BY MOUTH EVERY DAY  . hydrocortisone 1 % ointment Apply 1 application topically 2 (two) times daily. To areas of itching not to use if any blisters or open wounds. USE PRN itching.  . lamoTRIgine (LAMICTAL) 100 MG tablet Take 100 mg by  mouth 2 (two) times daily.  . Lancets (ONETOUCH ULTRASOFT) lancets Test fasting each morning and 2 hours before supper. Retest if having hypoglycemic symptoms.  Marland Kitchen levocetirizine (XYZAL) 5 MG tablet Take 1 tablet by mouth twice daily  . metFORMIN (GLUCOPHAGE) 500 MG tablet TAKE 2 TABLETS BY MOUTH IN THE MORNING ,1 TABLET AT LUNCH, & TAKE 2 TABLETS IN THE EVENING  . metoprolol tartrate (LOPRESSOR) 100 MG tablet TAKE 1 TABLET BY MOUTH TWICE A DAY  . ONETOUCH VERIO test strip TEST FASTING GLUCOSE DAILY AS DIRECTED (Patient taking differently: 1 each by Other route as directed. )  . predniSONE (STERAPRED UNI-PAK 21 TAB) 10 MG (21) TBPK tablet PO: Take 6 tablets on day 1:Take 5 tablets day 2:Take 4 tablets day 3: Take 3 tablets day 4:Take 2 tablets day five: 5 Take 1 tablet day 6  . promethazine (PHENERGAN) 25 MG tablet Take 25 mg by mouth every 4 (four) hours as needed.   . rosuvastatin (CRESTOR) 20 MG tablet Take 1 tablet (20 mg total) by mouth daily.  . TEGRETOL-XR 400 MG 12 hr tablet TAKE 1 TABLET BY MOUTH TWICE A DAY (Patient taking differently: Take 400 mg by mouth 2 (two) times daily. )  . valACYclovir (VALTREX) 1000 MG tablet Take 1 tablet (1,000 mg total) by mouth 3 (three) times daily.  Marland Kitchen VIIBRYD 40 MG TABS TAKE 1 TABLET BY MOUTH EVERY DAY   No facility-administered medications prior to visit.    Review of Systems  Respiratory: Negative for shortness of breath.   Cardiovascular: Negative for chest pain.      Objective    LMP  (LMP Unknown)    Physical Exam: WDWN  female in no apparent distress.  Head: Normocephalic, atraumatic. Neck: Supple, NROM Respiratory: No apparent distress Psych: Normal mood and affect   Assessment & Plan     1. Anxiety, generalized Episodes of nervousness, fast heart beat, shaking and stressed sensation the past 4-5 weeks. Will give trial of Buspar and recheck prn. Scared to leave home with COVID pandemic. - busPIRone (BUSPAR) 10 MG tablet; Take 1 tablet (10 mg total) by mouth 3 (three) times daily. (as needed for anxiety)  Dispense: 30 tablet; Refill: 1  2. Depression, major, recurrent, moderate ([place]) Still taking the Viibryd. Sleeping well and good appetite. No suicidal or homicidal ideation. Recommend she continue present medications and exercise regularly.   No follow-ups on file.     I discussed the assessment and treatment plan with the patient. The patient was provided an opportunity to ask questions and all were answered. The patient agreed with the plan and demonstrated an understanding of the instructions.   The patient was advised to call back or seek an in-person evaluation if the symptoms worsen or if the condition fails to improve as anticipated.  I provided 20 minutes of non-face-to-face time during this encounter.  Andres Shad, PA, have reviewed all documentation for this visit. The documentation on 02/28/20 for the exam, diagnosis, procedures, and orders are all accurate and complete.   Vernie Murders, [place] [phone] (phone) [phone] (fax)  [facility]

## 2020-02-28 ENCOUNTER — Telehealth (INDEPENDENT_AMBULATORY_CARE_PROVIDER_SITE_OTHER): Payer: Medicare Other | Admitting: Family Medicine

## 2020-02-28 ENCOUNTER — Other Ambulatory Visit: Payer: Self-pay

## 2020-02-28 ENCOUNTER — Encounter: Payer: Self-pay | Admitting: Family Medicine

## 2020-02-28 DIAGNOSIS — F411 Generalized anxiety disorder: Secondary | ICD-10-CM

## 2020-02-28 DIAGNOSIS — M170 Bilateral primary osteoarthritis of knee: Secondary | ICD-10-CM | POA: Diagnosis not present

## 2020-02-28 DIAGNOSIS — F331 Major depressive disorder, recurrent, moderate: Secondary | ICD-10-CM | POA: Diagnosis not present

## 2020-02-28 MED ORDER — BUSPIRONE HCL 10 MG PO TABS: 10.0000 mg | ORAL_TABLET | Freq: Three times a day (TID) | ORAL | 1 refills | Status: DC

## 2020-03-06 DIAGNOSIS — M170 Bilateral primary osteoarthritis of knee: Secondary | ICD-10-CM | POA: Diagnosis not present

## 2020-03-13 ENCOUNTER — Other Ambulatory Visit: Payer: Self-pay | Admitting: Cardiovascular Disease

## 2020-03-21 ENCOUNTER — Telehealth: Payer: Self-pay | Admitting: *Deleted

## 2020-03-21 NOTE — Chronic Care Management (AMB) (Signed)
  Chronic Care Management   Note  03/21/2020 Name: Abigail Figueroa MRN: [id number] DOB: [birth year]  Abigail Figueroa is a 59 y.o. year old female who is a primary care patient of [place], Vickki Muff, [place]. I reached out to [name] by phone today in response to a referral sent by Ms. Abigail Borg Villalva's health plan.     [name] was given information about Chronic Care Management services today including:  1. CCM service includes personalized support from designated clinical staff supervised by her physician, including individualized plan of care and coordination with other care providers 2. 24/7 contact phone numbers for assistance for urgent and routine care needs. 3. Service will only be billed when office clinical staff spend 20 minutes or more in a month to coordinate care. 4. Only one practitioner may furnish and bill the service in a calendar month. 5. The patient may stop CCM services at any time (effective at the end of the month) by phone call to the office staff. 6. The patient will be responsible for cost sharing (co-pay) of up to 20% of the service fee (after annual deductible is met).  Patient agreed to services and verbal consent obtained.   Follow up plan: Telephone appointment with care management team member scheduled for: 04/08/2020  [facility]  Direct Dial: [phone]

## 2020-04-02 DIAGNOSIS — G43719 Chronic migraine without aura, intractable, without status migrainosus: Secondary | ICD-10-CM | POA: Diagnosis not present

## 2020-04-03 ENCOUNTER — Telehealth: Payer: Self-pay

## 2020-04-03 NOTE — Telephone Encounter (Signed)
Copied from [employer] [id number]. Topic: General - Other >> Apr 03, 2020  3:20 PM Hinda Lenis D wrote: PT need to schedule her booster shot for [employer] / [employer] doze in April /please advise

## 2020-04-08 ENCOUNTER — Ambulatory Visit: Payer: Medicare Other

## 2020-04-08 DIAGNOSIS — I10 Essential (primary) hypertension: Secondary | ICD-10-CM

## 2020-04-08 DIAGNOSIS — E1100 Type 2 diabetes mellitus with hyperosmolarity without nonketotic hyperglycemic-hyperosmolar coma (NKHHC): Secondary | ICD-10-CM

## 2020-04-08 NOTE — Chronic Care Management (AMB) (Signed)
Chronic Care Management   Initial Visit Note  04/08/2020 Name: Abigail Figueroa MRN: [id number] DOB: 03-May-1961  Primary Care Provider: Margo Common, PA Reason for referral : Chronic Care Management   Abigail Figueroa is a 59 y.o. year old female who is a primary care patient of Chrismon, Vickki Muff, [place]. The CCM team was consulted for assistance with chronic disease management and care coordination. A telephonic outreach was conducted today.  Review of Abigail. Figueroa status, including review of consultants reports, relevant labs and test results was conducted today. Collaboration with appropriate care team members was performed as part of the comprehensive evaluation and provision of chronic care management services.     SDOH (Social Determinants of Health) assessments performed: Yes See Care Plan activities for detailed interventions related to SDOH  SDOH Interventions     Most Recent Value  SDOH Interventions  Food Insecurity Interventions Intervention Not Indicated  Financial Strain Interventions Other (Comment)  [Reports assistance is not required at this time.]  Transportation Interventions Intervention Not Indicated       Medications: Outpatient Encounter Medications as of 04/08/2020  Medication Sig  . amLODipine (NORVASC) 5 MG tablet TAKE 1.5 TABLETS (7.5 MG TOTAL) BY MOUTH DAILY.  Marland Kitchen BOTOX 100 UNITS SOLR injection Inject into the muscle every 3 (three) months.   . busPIRone (BUSPAR) 10 MG tablet Take 1 tablet (10 mg total) by mouth 3 (three) times daily. (as needed for anxiety)  . clindamycin (CLEOCIN T) 1 % lotion APPLY TOPICALLY ONCE A DAY TO AREAS OF ACNE ON THE FACE  . ezetimibe (ZETIA) 10 MG tablet Take 1 tablet (10 mg total) by mouth daily. Please schedule appointment with Dr. Claiborne Billings for refills.  . fluticasone (FLONASE) 50 MCG/ACT nasal spray SPRAY 2 SPRAYS INTO EACH NOSTRIL EVERY DAY  . furosemide (LASIX) 20 MG tablet TAKE 1 TABLET BY MOUTH EVERY DAY  .  hydrocortisone 1 % ointment Apply 1 application topically 2 (two) times daily. To areas of itching not to use if any blisters or open wounds. USE PRN itching.  . lamoTRIgine (LAMICTAL) 100 MG tablet Take 100 mg by mouth 2 (two) times daily.  Marland Kitchen levocetirizine (XYZAL) 5 MG tablet Take 1 tablet by mouth twice daily  . metFORMIN (GLUCOPHAGE) 500 MG tablet TAKE 2 TABLETS BY MOUTH IN THE MORNING ,1 TABLET AT LUNCH, & TAKE 2 TABLETS IN THE EVENING  . metoprolol tartrate (LOPRESSOR) 100 MG tablet TAKE 1 TABLET BY MOUTH TWICE A DAY  . promethazine (PHENERGAN) 25 MG tablet Take 25 mg by mouth every 4 (four) hours as needed.   . rosuvastatin (CRESTOR) 20 MG tablet Take 1 tablet (20 mg total) by mouth daily.  . TEGRETOL-XR 400 MG 12 hr tablet TAKE 1 TABLET BY MOUTH TWICE A DAY (Patient taking differently: Take 400 mg by mouth 2 (two) times daily. )  . VIIBRYD 40 MG TABS TAKE 1 TABLET BY MOUTH EVERY DAY  . aspirin 81 MG EC tablet Take by mouth.  (Patient not taking: Reported on 04/08/2020)  . baclofen (LIORESAL) 10 MG tablet Take 10 mg by mouth 2 (two) times daily as needed for muscle spasms.   . diphenhydramine-acetaminophen (TYLENOL PM) 25-500 MG TABS tablet Take 1 tablet by mouth at bedtime as needed. (Patient not taking: Reported on 04/08/2020)  . EMGALITY 120 MG/ML SOAJ Inject into the skin as needed.   Marland Kitchen EPINEPHrine 0.3 mg/0.3 mL IJ SOAJ injection Inject 0.3 mLs (0.3 mg total) into the muscle as needed  for up to 1 dose for anaphylaxis (Call 911 if used.).  Marland Kitchen fluocinonide ointment (LIDEX) 2.63 % Apply 1 application topically 2 (two) times daily.  . Lancets (ONETOUCH ULTRASOFT) lancets Test fasting each morning and 2 hours before supper. Retest if having hypoglycemic symptoms.  Glory Rosebush VERIO test strip TEST FASTING GLUCOSE DAILY AS DIRECTED (Patient taking differently: 1 each by Other route as directed. )   No facility-administered encounter medications on file as of 04/08/2020.     Objective:  BP  Readings from Last 3 Encounters:  02/05/20 (!) 162/74  10/11/19 (!) 155/78  06/26/19 124/68   Lab Results  Component Value Date   CHOL 310 (H) 02/05/2020   HDL 87 02/05/2020   LDLCALC 198 (H) 02/05/2020   TRIG 139 02/05/2020   CHOLHDL 3.6 02/05/2020    Lab Results  Component Value Date   HGBA1C 8.6 (H) 02/05/2020     Fall Risk  04/08/2020 02/21/2020 02/05/2020 03/20/2019 12/12/2018  Falls in the past year? 1 1 1  0 1  Number falls in past yr: 1 1 1  0 1  Injury with Fall? 0 0 0 0 0  Risk for fall due to : History of fall(s);Medication side effect;Impaired balance/gait Impaired balance/gait - - -  Follow up Falls prevention discussed Falls prevention discussed - Falls evaluation completed Falls prevention discussed     Goals Addressed            This Visit's Progress   . Chronic Care Management       Current Barriers:  . Chronic Disease Management support and education needs related to Congestive Cardiac Failure, Hypertension, Diabetes, Mixed Hyperlipidemia  Nurse Case Manager Clinical Goal(s):  Over the next 120 days, patient: . Will not require hospitalization or emergent care d/t complications r/t chronic illnesses. . Will monitor BP and record readings. . Will monitor FBS daily and maintain a log. . Will weigh at least once a week and record readings. . Will continue compliance with a recommended heart healthy/diabetic diet. . Will take all medications as prescribed. . Will attend medical appointments as scheduled. . Will adhere to recommended safety precautions to prevent accidental falls and injuries. Over the next 2 weeks, patient will: . Follow-up with [facility] regarding glucometer supplies. . Receive a Covid-19 vaccine booster.   Interventions:  . Inter-disciplinary care team collaboration (see longitudinal plan of care) . Reviewed medications and indications for use. Advised to continue taking medications as prescribed and notify provider if unable to  tolerate prescribed regimen. Encouraged to notify the care management team with concerns regarding medication management or prescription cost. Preparing medications using a pillbox. No current concerns regarding prescription cost. Glucometer supplies and EpiPen have expired. She will contact the pharmacy to obtain the needed refills.   . Provided information regarding established BP ranges and indications for notifying a provider. Encouraged to monitor BP a few times a week if unable to monitor daily and record readings.   . Discussed s/sx of complications r/t Congestive cardiac failure along with indications for seeking medical attention. Advised to take diuretics as prescribed and monitor weight at least once a week. Advised to notify provider for weight gain greater than 3 lbs overnight or greater than 5 lbs in a week.  . Reviewed blood glucose readings and compliance with recommended heart healthy/diabetic diet. Discussed s/sx of hypoglycemia and hyperglycemia along with recommended interventions. Advised to monitor FBS daily and record readings.  A1C of 8/7%. Recently started the DASH diet and feels  that she is doing well. We discussed options for diabetic education classes. She prefers not to attend group classes at this time but will consider a Zoom course if available. Agreed to call if she decides to participate and a referral is needed. Attempts to monitor FBS as advised. Reports morning reading of 129m/dl.   . Provided information regarding safety and fall prevention measures. Discussed moderate fall risks d/t impaired gait and medication side effects. Advised to change positions slowly and use caution when ambulating. Encouraged to update team if an order for a walker or assistive device is needed. Advised to keep pathways clear and well lit. Advised to be mindful of her pets when ambulating to prevent accidental falls. Reports a fall within the last year. Reports losing her balance when  attempting to get out of the bathtub. Denies injuries. She experiences chronic knee and joint pain. Reports not requiring a cane or walker but agreed to call if her ability to ambulate decreases and a device is needed.  . Reviewed pending/scheduled provider appointments. Encouraged to attend scheduled medical appointments to prevent delays in care. Encouraged to contact the care management team with concerns regarding transportation.  . Discussed plans for ongoing care management and follow up. Provided direct contact information.   Patient Self Care Activities:  . Patient will self administer medications. . Patient will attend all scheduled provider appointments . Patient will call pharmacy for medication refills . Patient will continue to perform ADL's independently . Patient will continue to perform IADL's independently . Patient will call provider office for new concerns or questions .   Initial goal documentation        Ms. HCernigliawas given information about Chronic Care Management services including:  1. CCM service includes personalized support from designated clinical staff supervised by her physician, including individualized plan of care and coordination with other care providers 2. 24/7 contact phone numbers for assistance for urgent and routine care needs. 3. Service will only be billed when office clinical staff spend 20 minutes or more in a month to coordinate care. 4. Only one practitioner may furnish and bill the service in a calendar month. 5. The patient may stop CCM services at any time (effective at the end of the month) by phone call to the office staff. 6. The patient will be responsible for cost sharing (co-pay) of up to 20% of the service fee (after annual deductible is met).  Patient agreed to services and verbal consent obtained.    PLAN A member of the care management team will follow-up with Abigail. Ellinwood next month.    [facility] Care Management [facility]([phone]

## 2020-04-08 NOTE — Patient Instructions (Signed)
Thank you for allowing the Chronic Care Management team to participate in your care.   Goals Addressed            This Visit's Progress   . Chronic Care Management       Current Barriers:  . Chronic Disease Management support and education needs related to Congestive Cardiac Failure, Hypertension, Diabetes, Mixed Hyperlipidemia  Nurse Case Manager Clinical Goal(s):  Over the next 120 days, patient: . Will not require hospitalization or emergent care d/t complications r/t chronic illnesses. . Will monitor BP and record readings. . Will monitor FBS daily and maintain a log. . Will weigh at least once a week and record readings. . Will continue compliance with a recommended heart healthy/diabetic diet. . Will take all medications as prescribed. . Will attend medical appointments as scheduled. . Will adhere to recommended safety precautions to prevent accidental falls and injuries. Over the next 2 weeks, patient will: . Follow-up with [facility] regarding glucometer supplies. . Receive a Covid-19 vaccine booster.   Interventions:  . Inter-disciplinary care team collaboration (see longitudinal plan of care) . Reviewed medications and indications for use. Advised to continue taking medications as prescribed and notify provider if unable to tolerate prescribed regimen. Encouraged to notify the care management team with concerns regarding medication management or prescription cost. Preparing medications using a pillbox. No current concerns regarding prescription cost. Glucometer supplies and EpiPen have expired. She will contact the pharmacy to obtain the needed refills.   . Provided information regarding established BP ranges and indications for notifying a provider. Encouraged to monitor BP a few times a week if unable to monitor daily and record readings.   . Discussed s/sx of complications r/t Congestive cardiac failure along with indications for seeking medical attention. Advised  to take diuretics as prescribed and monitor weight at least once a week. Advised to notify provider for weight gain greater than 3 lbs overnight or greater than 5 lbs in a week.  . Reviewed blood glucose readings and compliance with recommended heart healthy/diabetic diet. Discussed s/sx of hypoglycemia and hyperglycemia along with recommended interventions. Advised to monitor FBS daily and record readings.  A1C of 8/7%. Recently started the DASH diet and feels that she is doing well. We discussed options for diabetic education classes. She prefers not to attend group classes at this time but will consider a Zoom course if available. Agreed to call if she decides to participate and a referral is needed. Attempts to monitor FBS as advised. Reports morning reading of 15m/dl.   . Provided information regarding safety and fall prevention measures. Discussed moderate fall risks d/t impaired gait and medication side effects. Advised to change positions slowly and use caution when ambulating. Encouraged to update team if an order for a walker or assistive device is needed. Advised to keep pathways clear and well lit. Advised to be mindful of her pets when ambulating to prevent accidental falls. Reports a fall within the last year. Reports losing her balance when attempting to get out of the bathtub. Denies injuries. She experiences chronic knee and joint pain. Reports not requiring a cane or walker but agreed to call if her ability to ambulate decreases and a device is needed.  . Reviewed pending/scheduled provider appointments. Encouraged to attend scheduled medical appointments to prevent delays in care. Encouraged to contact the care management team with concerns regarding transportation.  . Discussed plans for ongoing care management and follow up. Provided direct contact information.   Patient Self  Care Activities:  . Patient will self administer medications. . Patient will attend all scheduled provider  appointments . Patient will call pharmacy for medication refills . Patient will continue to perform ADL's independently . Patient will continue to perform IADL's independently . Patient will call provider office for new concerns or questions .   Initial goal documentation        Mrs. Yarborough was given information about Chronic Care Management services including:  1. CCM service includes personalized support from designated clinical staff supervised by her physician, including individualized plan of care and coordination with other care providers 2. 24/7 contact phone numbers for assistance for urgent and routine care needs. 3. Service will only be billed when office clinical staff spend 20 minutes or more in a month to coordinate care. 4. Only one practitioner may furnish and bill the service in a calendar month. 5. The patient may stop CCM services at any time (effective at the end of the month) by phone call to the office staff. 6. The patient will be responsible for cost sharing (co-pay) of up to 20% of the service fee (after annual deductible is met).  Patient agreed to services and verbal consent obtained.    Mrs. Milosevic verbalized understanding of the information discussed during the telephonic outreach today. She is agreeable to receiving printed educational materials prior to her next outreach.   A member of the care management team will follow-up with Mrs. Mazurowski next month.    [name] [facility] Care Management [facility] [phone]

## 2020-04-09 DIAGNOSIS — G518 Other disorders of facial nerve: Secondary | ICD-10-CM | POA: Diagnosis not present

## 2020-04-09 DIAGNOSIS — M542 Cervicalgia: Secondary | ICD-10-CM | POA: Diagnosis not present

## 2020-04-09 DIAGNOSIS — M791 Myalgia, unspecified site: Secondary | ICD-10-CM | POA: Diagnosis not present

## 2020-04-09 DIAGNOSIS — G43719 Chronic migraine without aura, intractable, without status migrainosus: Secondary | ICD-10-CM | POA: Diagnosis not present

## 2020-04-21 ENCOUNTER — Telehealth (INDEPENDENT_AMBULATORY_CARE_PROVIDER_SITE_OTHER): Payer: Medicare Other | Admitting: Family Medicine

## 2020-04-21 ENCOUNTER — Other Ambulatory Visit: Payer: Self-pay

## 2020-04-21 ENCOUNTER — Other Ambulatory Visit: Payer: Medicare Other

## 2020-04-21 ENCOUNTER — Encounter: Payer: Self-pay | Admitting: Family Medicine

## 2020-04-21 DIAGNOSIS — Z20822 Contact with and (suspected) exposure to covid-19: Secondary | ICD-10-CM

## 2020-04-21 DIAGNOSIS — T148XXA Other injury of unspecified body region, initial encounter: Secondary | ICD-10-CM

## 2020-04-21 MED ORDER — DOXYCYCLINE HYCLATE 100 MG PO TABS: 100.0000 mg | ORAL_TABLET | Freq: Two times a day (BID) | ORAL | 0 refills | Status: DC

## 2020-04-21 NOTE — Progress Notes (Signed)
MyChart Video Visit    Virtual Visit via Video Note   This visit type was conducted due to national recommendations for restrictions regarding the COVID-19 Pandemic (e.g. social distancing) in an effort to limit this patient's exposure and mitigate transmission in our community. This patient is at least at moderate risk for complications without adequate follow up. This format is felt to be most appropriate for this patient at this time. Physical exam was limited by quality of the video and audio technology used for the visit.   Patient location: home Provider location: office  I discussed the limitations of evaluation and management by telemedicine and the availability of in person appointments. The patient expressed understanding and agreed to proceed.  Patient: Abigail Figueroa   DOB: 01/27/1961   59 y.o. Female  MRN: [id number] Visit Date: 04/21/2020  Today's healthcare provider: Vernie Murders, PA-C   No chief complaint on file.  Subjective    HPI  Patient is a 59 year old female who presents via video visit for a possibly infected scratch on her stomach.  She states she des not know how she got scratched.  She noticed it 8-10 days ago.  She reports it is red and slightly tender to the touch.  No drainage and she has not been running any fever.  Patient also states that she thinks she has Covid-19.  Her daughter tested positive on 04/19/20.  She has appointment to be tested at 35 today.    Past Medical History:  Diagnosis Date   Abnormal stress test    a. 10/2009: Normal myocardial perfusion imaging; b. 08/2015 MV: medium defect of mod severity in mid ant apical region w/ mild HK of the distal inf wall;  c. 08/2015 Cath: nl Cors, EF 60%.   Anemia    Angio-edema    Anxiety    Arthritis    Bell's palsy    Depression    Edema    Essential hypertension    Frequent urination    Frequent urination at night    Headache    migraines, gets botox injections    Heart palpitations June 2013   Event monitor showing sinus tachycardia, PACs with couplets and triplets.   Hx of echocardiogram    a. 10/2009 Echo: showed normal left ventricular function, mild left ventricular hypertrophy, and no significant valve abnormalities.   Hypercholesterolemia    Migraine    Morbid obesity (HCC)    Neuromuscular disorder (HCC)    OSA (obstructive sleep apnea)    has been on continuous positive airway pressure   Seizure disorder ([place])    Seizures ([place])    Type II diabetes mellitus ([place])    Urticaria    Family History  Problem Relation Age of Onset   Sarcoidosis Mother    Seizures Mother    Heart attack Mother    Alzheimer's disease Maternal Grandmother    Dementia Maternal Grandmother    Hypertension Maternal Grandfather    Diabetes Maternal Grandfather    Breast cancer Neg Hx    Allergic rhinitis Neg Hx    Asthma Neg Hx    Eczema Neg Hx    Urticaria Neg Hx    Past Surgical History:  Procedure Laterality Date   ABDOMINAL HYSTERECTOMY  1990   without BSO   BACK SURGERY  1900s 2001   x2 with "cage put in"   [place] N/A 08/06/2015   Procedure: Left Heart Cath and  Coronary Angiography;  Surgeon: Troy Sine, MD;  Location: [facility];  Service: Cardiovascular;  Laterality: N/A;   CARPAL TUNNEL RELEASE Bilateral    CHOLECYSTECTOMY  1980   COLONOSCOPY WITH PROPOFOL N/A 08/30/2017   Procedure: COLONOSCOPY WITH PROPOFOL;  Surgeon: Lin Landsman, MD;  Location: [facility];  Service: Endoscopy;  Laterality: N/A;   DG THUMB LEFT HAND  01/24/2018   repair joint in left thumb   DORSAL COMPARTMENT RELEASE Left 10/25/2014   Procedure: LEFT FIRST  DORSAL COMPARTMENT RELEASE AND RADIAL TENOSYNOVECTOMY ;  Surgeon: Roseanne Kaufman, MD;  Location: [facility];  Service: Orthopedics;  Laterality: Left;   HAND SURGERY     KNEE SURGERY Right 2012    meniscus tear   KNEE SURGERY  2012   LAMINECTOMY  1995   TUBAL LIGATION  1982   WRIST SURGERY Right    Social History   Tobacco Use   Smoking status: Former Smoker    Types: Cigarettes    Quit date: 01/03/1983    Years since quitting: 37.3   Smokeless tobacco: Never Used   Tobacco comment: quit in 1984  Vaping Use   Vaping Use: Never used  Substance Use Topics   Alcohol use: No    Alcohol/week: 0.0 standard drinks   Drug use: No   Allergies  Allergen Reactions   Morphine Rash and Swelling   Morphine And Related Hives    Nausea and vomiting  Nausea and vomiting   Oxycodone Hives, Itching, Rash and Swelling   Oxycodone Hcl Swelling, Hives and Itching   Aimovig [Erenumab-Aooe]     Questionable lip swelling angoiedema   Morphine Sulfate Itching and Nausea And Vomiting    REACTION: swelling     Medications: Outpatient Medications Prior to Visit  Medication Sig   amLODipine (NORVASC) 5 MG tablet TAKE 1.5 TABLETS (7.5 MG TOTAL) BY MOUTH DAILY.   baclofen (LIORESAL) 10 MG tablet Take 10 mg by mouth 2 (two) times daily as needed for muscle spasms.    BOTOX 100 UNITS SOLR injection Inject into the muscle every 3 (three) months.    busPIRone (BUSPAR) 10 MG tablet Take 1 tablet (10 mg total) by mouth 3 (three) times daily. (as needed for anxiety)   clindamycin (CLEOCIN T) 1 % lotion APPLY TOPICALLY ONCE A DAY TO AREAS OF ACNE ON THE FACE   EMGALITY 120 MG/ML SOAJ Inject into the skin as needed.    EPINEPHrine 0.3 mg/0.3 mL IJ SOAJ injection Inject 0.3 mLs (0.3 mg total) into the muscle as needed for up to 1 dose for anaphylaxis (Call 911 if used.).   ezetimibe (ZETIA) 10 MG tablet Take 1 tablet (10 mg total) by mouth daily. Please schedule appointment with Dr. Claiborne Billings for refills.   fluocinonide ointment (LIDEX) 3.71 % Apply 1 application topically 2 (two) times daily.   fluticasone (FLONASE) 50 MCG/ACT nasal spray SPRAY 2 SPRAYS INTO EACH NOSTRIL EVERY DAY    furosemide (LASIX) 20 MG tablet TAKE 1 TABLET BY MOUTH EVERY DAY   hydrocortisone 1 % ointment Apply 1 application topically 2 (two) times daily. To areas of itching not to use if any blisters or open wounds. USE PRN itching.   lamoTRIgine (LAMICTAL) 100 MG tablet Take 100 mg by mouth 2 (two) times daily.   Lancets (ONETOUCH ULTRASOFT) lancets Test fasting each morning and 2 hours before supper. Retest if having hypoglycemic symptoms.   levocetirizine (XYZAL) 5 MG tablet Take 1 tablet by mouth  twice daily   metFORMIN (GLUCOPHAGE) 500 MG tablet TAKE 2 TABLETS BY MOUTH IN THE MORNING ,1 TABLET AT LUNCH, & TAKE 2 TABLETS IN THE EVENING   metoprolol tartrate (LOPRESSOR) 100 MG tablet TAKE 1 TABLET BY MOUTH TWICE A DAY   ONETOUCH VERIO test strip TEST FASTING GLUCOSE DAILY AS DIRECTED (Patient taking differently: 1 each by Other route as directed.)   promethazine (PHENERGAN) 25 MG tablet Take 25 mg by mouth every 4 (four) hours as needed.    rosuvastatin (CRESTOR) 20 MG tablet Take 1 tablet (20 mg total) by mouth daily.   TEGRETOL-XR 400 MG 12 hr tablet TAKE 1 TABLET BY MOUTH TWICE A DAY (Patient taking differently: Take 400 mg by mouth 2 (two) times daily.)   VIIBRYD 40 MG TABS TAKE 1 TABLET BY MOUTH EVERY DAY   aspirin 81 MG EC tablet Take by mouth.  (Patient not taking: No sig reported)   diphenhydramine-acetaminophen (TYLENOL PM) 25-500 MG TABS tablet Take 1 tablet by mouth at bedtime as needed. (Patient not taking: No sig reported)   No facility-administered medications prior to visit.    Review of Systems  Constitutional: Positive for fatigue. Negative for fever.  HENT: Positive for congestion, rhinorrhea and sore throat.   Eyes: Negative.   Respiratory: Positive for cough.   Cardiovascular: Negative.   Gastrointestinal: Negative.   Skin:       Couple of scabbed scratches on right lower abdomen.       Objective    LMP  (LMP Unknown)     Physical Exam: WDWN female  in no apparent distress.  Head: Normocephalic, atraumatic. Neck: Supple, NROM Respiratory: No apparent distress. Congested cough occasionally. Psych: Normal mood and affect Skin: Healing excoriation on the right lower abdomen with slight surrounding erythema. No apparent drainage.     Assessment & Plan     1. Suspected COVID-19 virus infection Developed congested cough, sore throat, generalized malaise with body aches, rhinorrhea and chest soreness over the past 5 days (04-16-20). Daughter received a COVID positive test results on 04-19-20. States grand daughter was also sick over the past couple weeks. This patient has had COVID vaccinations and booster. Advised to follow COVID isolation at home and use Mucinex-DM for cough. Add Doxycycline to prevent pneumonia and drink extra fluids. Monitor for COVid symptoms and get COVID tests. - doxycycline (VIBRA-TABS) 100 MG tablet; Take 1 tablet (100 mg total) by mouth 2 (two) times daily.  Dispense: 20 tablet; Refill: 0 - COVID-19, Flu A+B and RSV  2. Scratches Healing scratches on right lower abdomen without signs of infection.  No follow-ups on file.     I discussed the assessment and treatment plan with the patient. The patient was provided an opportunity to ask questions and all were answered. The patient agreed with the plan and demonstrated an understanding of the instructions.   The patient was advised to call back or seek an in-person evaluation if the symptoms worsen or if the condition fails to improve as anticipated.  I provided 21 minutes of non-face-to-face time during this encounter.  I, Kiyona Mcnall, PA-C, have reviewed all documentation for this visit. The documentation on 04/21/20 for the exam, diagnosis, procedures, and orders are all accurate and complete.   Vernie Murders, PA-C [employer] [phone] (phone) [phone] (fax)  [place]

## 2020-04-23 ENCOUNTER — Telehealth: Payer: Self-pay

## 2020-04-23 LAB — COVID-19, FLU A+B AND RSV
Influenza A, NAA: NOT DETECTED
Influenza B, NAA: NOT DETECTED
RSV, NAA: NOT DETECTED
SARS-CoV-2, NAA: NOT DETECTED

## 2020-04-23 NOTE — Telephone Encounter (Signed)
Copied from [place] [phone]. Topic: General - Other >> Apr 23, 2020  4:35 PM Mcneil, Ja-Kwan wrote: Reason for CRM: Pt stated the otc Robitussin is not working and she would like to know if a Rx for a medication for the cough could be sent to her pharmacy. Pt requests call back

## 2020-04-24 ENCOUNTER — Other Ambulatory Visit: Payer: Self-pay | Admitting: Family Medicine

## 2020-04-24 MED ORDER — BENZONATATE 200 MG PO CAPS: 200.0000 mg | ORAL_CAPSULE | Freq: Two times a day (BID) | ORAL | 0 refills | Status: DC | PRN

## 2020-04-24 NOTE — Telephone Encounter (Signed)
Advised patient both she and her husband had negative COVID tests. Continue Doxycycline for bronchitis and to prevent pneumonia. Continue Robitussin-DM and add Tessalon Per[place] for control of cough. Increase fluids and follow pandemic restrictions.

## 2020-05-07 ENCOUNTER — Other Ambulatory Visit: Payer: Self-pay | Admitting: Cardiovascular Disease

## 2020-05-13 ENCOUNTER — Telehealth: Payer: Medicare Other | Admitting: Family Medicine

## 2020-05-13 ENCOUNTER — Ambulatory Visit: Payer: Self-pay | Admitting: Family Medicine

## 2020-05-13 ENCOUNTER — Other Ambulatory Visit: Payer: Self-pay

## 2020-05-15 ENCOUNTER — Telehealth (INDEPENDENT_AMBULATORY_CARE_PROVIDER_SITE_OTHER): Payer: Medicare Other | Admitting: Family Medicine

## 2020-05-15 ENCOUNTER — Encounter: Payer: Self-pay | Admitting: Family Medicine

## 2020-05-15 ENCOUNTER — Other Ambulatory Visit: Payer: Self-pay | Admitting: Family Medicine

## 2020-05-15 ENCOUNTER — Other Ambulatory Visit: Payer: Self-pay

## 2020-05-15 DIAGNOSIS — E119 Type 2 diabetes mellitus without complications: Secondary | ICD-10-CM

## 2020-05-15 DIAGNOSIS — J069 Acute upper respiratory infection, unspecified: Secondary | ICD-10-CM | POA: Diagnosis not present

## 2020-05-15 DIAGNOSIS — F411 Generalized anxiety disorder: Secondary | ICD-10-CM

## 2020-05-15 MED ORDER — BUSPIRONE HCL 10 MG PO TABS: 10.0000 mg | ORAL_TABLET | Freq: Three times a day (TID) | ORAL | 1 refills | Status: DC

## 2020-05-15 MED ORDER — ONETOUCH VERIO VI STRP: ORAL_STRIP | 3 refills | Status: DC

## 2020-05-15 NOTE — Telephone Encounter (Signed)
Medication Refill - Medication: busPIRone (BUSPAR) 10 MG tablet   PT is completely out of her current supply, please advise   Has the patient contacted their pharmacy? Yes.   (Agent: If no, request that the patient contact the pharmacy for the refill.) (Agent: If yes, when and what did the pharmacy advise?)  Preferred Pharmacy (with phone number or street name):  [id number] [address], [place] - [place]  [place] [address]  Phone: [phone] Fax: [phone]     Agent: Please be advised that RX refills may take up to 3 business days. We ask that you follow-up with your pharmacy.

## 2020-05-15 NOTE — Progress Notes (Signed)
MyChart Video Visit    Virtual Visit via Video Note   This visit type was conducted due to national recommendations for restrictions regarding the COVID-19 Pandemic (e.g. social distancing) in an effort to limit this patient's exposure and mitigate transmission in our community. This patient is at least at moderate risk for complications without adequate follow up. This format is felt to be most appropriate for this patient at this time. Physical exam was limited by quality of the video and audio technology used for the visit.   Patient location: home Provider location: office  I discussed the limitations of evaluation and management by telemedicine and the availability of in person appointments. The patient expressed understanding and agreed to proceed.  Patient: Abigail Figueroa   DOB: Aug 18, 1960   60 y.o. Female  MRN: [id number] Visit Date: 05/15/2020  Today's healthcare provider: Vernie Murders, PA-C   Chief Complaint  Patient presents with  . Anxiety  . Depression   Subjective    HPI  Anxiety, Follow-up  She was last seen for anxiety 3 months ago. Changes made at last visit include Trial of Buspar 10mg  TID.   She reports excellent compliance with treatment. She reports excellent tolerance of treatment. She is not having side effects.   She feels her anxiety is mild and Improved since last visit.  Symptoms: No chest pain Yes difficulty concentrating  No dizziness Yes fatigue  No feelings of losing control No insomnia  No irritable No palpitations  No panic attacks No racing thoughts  No shortness of breath No sweating  Yes tremors/shakes    GAD-7 Results No flowsheet data found.  PHQ-9 Scores PHQ9 SCORE ONLY 04/08/2020 02/05/2020 05/29/2019  PHQ-9 Total Score 1 10 0   Depression, Follow-up  She  was last seen for this 3 months ago. Changes made at last visit include none, continue Viibyrd.   She reports excellent compliance with treatment. She is not  having side effects.   She reports excellent tolerance of treatment. Current symptoms include: depressed mood, difficulty concentrating, fatigue, feelings of worthlessness/guilt, hopelessness, impaired memory and weight gain She feels she is Worse since last visit.  Depression screen [facility] 2/9 04/08/2020 02/05/2020 05/29/2019  Decreased Interest 0 3 0  Down, Depressed, Hopeless 1 2 0  PHQ - 2 Score 1 5 0  Altered sleeping - 0 0  Tired, decreased energy - 2 0  Change in appetite - 1 0  Feeling bad or failure about yourself  - 0 0  Trouble concentrating - 1 0  Moving slowly or fidgety/restless - 0 0  Suicidal thoughts - 1 0  PHQ-9 Score - 10 0  Difficult doing work/chores - Very difficult -  Some recent data might be hidden    -----------------------------------------------------------------------------------------  Patient Active Problem List   Diagnosis Date Noted  . Seasonal and perennial allergic rhinitis 10/11/2019  . Angio-edema 08/30/2019  . Allergic reaction 08/30/2019  . Morbid obesity due to excess calories ([place]) 02/06/2019  . Osteoarthrosis of hand 12/28/2017  . Special screening for malignant neoplasms, colon   . Rhinitis, allergic 06/18/2017  . Right-sided Bell's palsy 02/02/2016  . Spondylolisthesis of lumbar region 08/25/2015  . Type II diabetes mellitus ([place])   . Abnormal stress test   . Hypercholesterolemia   . Abnormal nuclear stress test 07/30/2015  . Preoperative clearance 07/22/2015  . Lower extremity edema 07/22/2015  . History of brain disorder 11/05/2014  . H/O: obesity 11/05/2014  . Depression, major, recurrent, moderate ([place])  11/05/2014  . Difficulty with family 11/05/2014  . Feeling stressed out 11/05/2014  . H/O: HTN (hypertension) 11/05/2014  . H/O elevated lipids 11/05/2014  . Anxiety, generalized 11/05/2014  . Migraine headache 11/05/2014  . Moderate recurrent major depression ([place]) 10/16/2014  . Generalized anxiety disorder 10/16/2014  .  Edema 10/14/2014  . Depressive disorder 10/14/2014  . Obstructive sleep apnea 10/14/2014  . Diabetes ([place]) 10/14/2014  . Essential hypertension 10/14/2014  . Epilepsy ([place]) 10/14/2014  . Migraine 10/14/2014  . Hyperlipemia 10/14/2014  . Dysthymic disorder 10/14/2014  . Seizure ([place]) 10/14/2014  . Hypercholesteremia 10/14/2014  . Palpitation 10/14/2014  . Depression 10/14/2014  . Dyspepsia 10/14/2014  . Hematochezia 10/14/2014  . Urge incontinence 10/14/2014  . Hair thinning 10/14/2014  . Dermatitis 09/10/2014  . Acid indigestion 09/10/2014  . Alopecia 09/10/2014  . Blood in feces 09/10/2014  . H/O hypercholesterolemia 09/10/2014  . Arthralgia of hip 09/10/2014  . Feeling bilious 09/10/2014  . Awareness of heartbeats 09/10/2014  . Pain in the wrist 09/10/2014  . Convulsions ([place]) 09/10/2014  . Urge incontinence 09/10/2014  . Snapping thumb syndrome 04/19/2014  . Diabetes mellitus ([place]) 03/14/2014  . De Quervain's disease (radial styloid tenosynovitis) 03/08/2014  . LBP (low back pain) 03/07/2013  . Mixed hyperlipidemia 01/15/2013  . Encounter for long-term (current) use of other medications - statin 01/15/2013  . Fatigue 01/15/2013  . Heart palpation 01/15/2013  . Obesity (BMI 30-39.9) 01/02/2013  . CCF (congestive cardiac failure) ([place]) 11/13/2009  . Malaise and fatigue 02/17/2009  . Sprain and strain of interphalangeal (joint) of hand 10/23/2007  . HEADACHE 06/23/2007  . Diabetes mellitus type 2 in obese ([place]) 05/08/2007  . HYPERCALCEMIA 05/08/2007  . Sleep apnea 05/08/2007  . Edema 05/08/2007  . DIARRHEA, CHRONIC 05/08/2007  . Clinical depression 10/20/2006  . Obstructive apnea 03/22/2006  . Diabetes mellitus, type 2 ([place]) 11/12/2005  . Depression, neurotic 11/12/2005  . Epilepsy ([place]) 11/12/2005  . Combined fat and carbohydrate induced hyperlipemia 11/12/2005  . Acute onset aura migraine 11/12/2005   Past Medical History:  Diagnosis Date  . Abnormal stress  test    a. 10/2009: Normal myocardial perfusion imaging; b. 08/2015 MV: medium defect of mod severity in mid ant apical region w/ mild HK of the distal inf wall;  c. 08/2015 Cath: nl Cors, EF 60%.  . Anemia   . Angio-edema   . Anxiety   . Arthritis   . Bell's palsy   . Depression   . Edema   . Essential hypertension   . Frequent urination   . Frequent urination at night   . Headache    migraines, gets botox injections  . Heart palpitations June 2013   Event monitor showing sinus tachycardia, PACs with couplets and triplets.  Marland Kitchen Hx of echocardiogram    a. 10/2009 Echo: showed normal left ventricular function, mild left ventricular hypertrophy, and no significant valve abnormalities.  . Hypercholesterolemia   . Migraine   . Morbid obesity ([place])   . Neuromuscular disorder ([place])   . OSA (obstructive sleep apnea)    has been on continuous positive airway pressure  . Seizure disorder ([place])   . Seizures ([place])   . Type II diabetes mellitus ([place])   . Urticaria    Allergies  Allergen Reactions  . Morphine Rash and Swelling  . Morphine And Related Hives    Nausea and vomiting  Nausea and vomiting  . Oxycodone Hives, Itching, Rash and Swelling  . Oxycodone Hcl Swelling, Hives  and Itching  . Aimovig [Erenumab-Aooe]     Questionable lip swelling angoiedema  . Morphine Sulfate Itching and Nausea And Vomiting    REACTION: swelling      Medications: Outpatient Medications Prior to Visit  Medication Sig  . amLODipine (NORVASC) 5 MG tablet TAKE 1.5 TABLETS (7.5 MG TOTAL) BY MOUTH DAILY.  Marland Kitchen aspirin 81 MG EC tablet Take by mouth.  (Patient not taking: No sig reported)  . baclofen (LIORESAL) 10 MG tablet Take 10 mg by mouth 2 (two) times daily as needed for muscle spasms.   . benzonatate (TESSALON) 200 MG capsule Take 1 capsule (200 mg total) by mouth 2 (two) times daily as needed for cough.  Marland Kitchen BOTOX 100 UNITS SOLR injection Inject into the muscle every 3 (three) months.   . busPIRone  (BUSPAR) 10 MG tablet Take 1 tablet (10 mg total) by mouth 3 (three) times daily. (as needed for anxiety)  . clindamycin (CLEOCIN T) 1 % lotion APPLY TOPICALLY ONCE A DAY TO AREAS OF ACNE ON THE FACE  . diphenhydramine-acetaminophen (TYLENOL PM) 25-500 MG TABS tablet Take 1 tablet by mouth at bedtime as needed. (Patient not taking: No sig reported)  . doxycycline (VIBRA-TABS) 100 MG tablet Take 1 tablet (100 mg total) by mouth 2 (two) times daily.  Marland Kitchen EMGALITY 120 MG/ML SOAJ Inject into the skin as needed.   Marland Kitchen EPINEPHrine 0.3 mg/0.3 mL IJ SOAJ injection Inject 0.3 mLs (0.3 mg total) into the muscle as needed for up to 1 dose for anaphylaxis (Call 911 if used.).  Marland Kitchen ezetimibe (ZETIA) 10 MG tablet TAKE 1 TABLET (10 MG TOTAL) BY MOUTH DAILY. PLEASE SCHEDULE APPOINTMENT WITH DR. Claiborne Billings FOR REFILLS.  . fluocinonide ointment (LIDEX) 9.56 % Apply 1 application topically 2 (two) times daily.  . fluticasone (FLONASE) 50 MCG/ACT nasal spray SPRAY 2 SPRAYS INTO EACH NOSTRIL EVERY DAY  . furosemide (LASIX) 20 MG tablet TAKE 1 TABLET BY MOUTH EVERY DAY  . hydrocortisone 1 % ointment Apply 1 application topically 2 (two) times daily. To areas of itching not to use if any blisters or open wounds. USE PRN itching.  . lamoTRIgine (LAMICTAL) 100 MG tablet Take 100 mg by mouth 2 (two) times daily.  . Lancets (ONETOUCH ULTRASOFT) lancets Test fasting each morning and 2 hours before supper. Retest if having hypoglycemic symptoms.  Marland Kitchen levocetirizine (XYZAL) 5 MG tablet Take 1 tablet by mouth twice daily  . metFORMIN (GLUCOPHAGE) 500 MG tablet TAKE 2 TABLETS BY MOUTH IN THE MORNING ,1 TABLET AT LUNCH, & TAKE 2 TABLETS IN THE EVENING  . metoprolol tartrate (LOPRESSOR) 100 MG tablet TAKE 1 TABLET BY MOUTH TWICE A DAY  . ONETOUCH VERIO test strip TEST FASTING GLUCOSE DAILY AS DIRECTED (Patient taking differently: 1 each by Other route as directed.)  . promethazine (PHENERGAN) 25 MG tablet Take 25 mg by mouth every 4 (four) hours  as needed.   . rosuvastatin (CRESTOR) 20 MG tablet Take 1 tablet (20 mg total) by mouth daily.  . TEGRETOL-XR 400 MG 12 hr tablet TAKE 1 TABLET BY MOUTH TWICE A DAY (Patient taking differently: Take 400 mg by mouth 2 (two) times daily.)  . VIIBRYD 40 MG TABS TAKE 1 TABLET BY MOUTH EVERY DAY   No facility-administered medications prior to visit.    Review of Systems  Constitutional: Negative.   HENT: Positive for postnasal drip.   Respiratory: Positive for cough. Negative for wheezing.   Cardiovascular: Negative.   Gastrointestinal: Negative.  Last hemoglobin A1c Lab Results  Component Value Date   HGBA1C 8.6 (H) 02/05/2020      Objective    LMP  (LMP Unknown)    Physical Exam: WDWN female in no apparent distress.  Head: Normocephalic, atraumatic. Neck: Supple, NROM Respiratory: No apparent distress Psych: Normal mood and affect      Assessment & Plan    1. Viral URI with cough Some ticklish cough at night with PND over the past couple weeks. Negative COVID test on 04-21-20. No fever, sore throat, earache, sinus congestion, dyspnea or loss of taste. Tessalon helping to control cough. May use Xyzal and Flonase. Recheck prn.  2. Type 2 diabetes mellitus without complication, without long-term current use of insulin (HCC) Needs refill of OneTouch Verio test strips. - glucose blood (ONETOUCH VERIO) test strip; TEST FASTING GLUCOSE DAILY AS DIRECTED  Dispense: 100 each; Refill: 3   No follow-ups on file.     I discussed the assessment and treatment plan with the patient. The patient was provided an opportunity to ask questions and all were answered. The patient agreed with the plan and demonstrated an understanding of the instructions.   The patient was advised to call back or seek an in-person evaluation if the symptoms worsen or if the condition fails to improve as anticipated.  I provided 20 minutes of non-face-to-face time during this encounter.    Vernie Murders, PA-C [employer] [phone] (phone) [phone] (fax)  [place]

## 2020-05-17 ENCOUNTER — Other Ambulatory Visit: Payer: Self-pay | Admitting: Allergy & Immunology

## 2020-05-19 ENCOUNTER — Other Ambulatory Visit: Payer: Self-pay | Admitting: Allergy & Immunology

## 2020-05-20 ENCOUNTER — Other Ambulatory Visit: Payer: Self-pay | Admitting: Family Medicine

## 2020-05-20 ENCOUNTER — Ambulatory Visit: Payer: Self-pay

## 2020-05-20 DIAGNOSIS — E119 Type 2 diabetes mellitus without complications: Secondary | ICD-10-CM

## 2020-05-20 DIAGNOSIS — I509 Heart failure, unspecified: Secondary | ICD-10-CM

## 2020-05-20 MED ORDER — ONETOUCH ULTRASOFT LANCETS MISC: 12 refills | Status: DC

## 2020-05-22 ENCOUNTER — Other Ambulatory Visit: Payer: Self-pay

## 2020-05-22 ENCOUNTER — Encounter: Payer: Self-pay | Admitting: Allergy & Immunology

## 2020-05-22 ENCOUNTER — Ambulatory Visit (INDEPENDENT_AMBULATORY_CARE_PROVIDER_SITE_OTHER): Payer: Medicare Other | Admitting: Allergy & Immunology

## 2020-05-22 VITALS — Temp 98.2°F | Ht 63.0 in | Wt 209.0 lb

## 2020-05-22 DIAGNOSIS — T783XXD Angioneurotic edema, subsequent encounter: Secondary | ICD-10-CM | POA: Diagnosis not present

## 2020-05-22 DIAGNOSIS — J3089 Other allergic rhinitis: Secondary | ICD-10-CM | POA: Diagnosis not present

## 2020-05-22 DIAGNOSIS — T783XXS Angioneurotic edema, sequela: Secondary | ICD-10-CM

## 2020-05-22 DIAGNOSIS — J302 Other seasonal allergic rhinitis: Secondary | ICD-10-CM

## 2020-05-22 DIAGNOSIS — L508 Other urticaria: Secondary | ICD-10-CM

## 2020-05-22 NOTE — Progress Notes (Signed)
RE: Abigail Figueroa MRN: [id number] DOB: Dec 17, 1960 Date of Telemedicine Visit: 05/22/2020  Referring provider: Margo Common, PA-C Primary care provider: Margo Common, PA-C  Chief Complaint: Allergic Rhinitis  (Eyes watering and face itching //Stopped taking levocetirizine her allergy medication last Thursday because insurance did not cover it. Just started taking an over the counter allergy med similar to prescription and it caused her to be drowsy/ with lack of energy started last Saturday and stopped Tuesday)   Telemedicine Follow Up Visit via Telephone: I connected with Abigail Figueroa for a follow up on 05/22/20 by telephone and verified that I am speaking with the correct person using two identifiers.   I discussed the limitations, risks, security and privacy concerns of performing an evaluation and management service by telephone and the availability of in person appointments. I also discussed with the patient that there may be a patient responsible charge related to this service. The patient expressed understanding and agreed to proceed.  Patient is at home accompanied by her husband who provided/contributed to the history.  Provider is at the office.  Visit start time: 11:15 AM Visit end time: 11:42 AM Insurance consent/check in by: [facility] Medical consent and medical assistant/nurse: Abigail Figueroa  History of Present Illness:  She is a 60 y.o. female, who is being followed for allergic urticaria and perennial and seasonal allergic rhinitis. Her previous allergy office visit was in July 2021 with myself.  She was last seen via televisit in July 2021.  At that time, she was doing very well on the combination of medications to control her allergic rhinitis and angioedema.  We did not make any medication changes and we avoided initiating allergen immunotherapy since she was doing so well.  Since last visit, she has done well. She reports that her blood pressure has been high  recently. She has a cuff at home and is getting one for me today. It is 166/90. Typically the systolic is in the 884Z. She blames the weather. She is on blood pressure medication and has been good about taking it.   She reports that there is "normal" amount of swelling. She reports that she has not had hives, but she has been itching on her face. She reports that around the end of January or beginning of February she starts itching with eye watering and nose issues. She has a lot of hives as well.   Her allergic rhinitis symptoms have been well controlled. She does use a nose spray on a PRN basis. She uses fluticasone nasal spray one spray per nostril as needed only. She did have a lot of postnasal drip and it was recommended that she use her nasal spray more regularly. She did get tested but this was negative. Her daughter was tested 2-3 times and was negative. But then a PCR was positive. Everyone has had their vaccinations.    Overall, she feels very good and is happy with how well she is doing.  Otherwise, there have been no changes to her past medical history, surgical history, family history, or social history.  Assessment and Plan:  Abigail Figueroa is a 60 y.o. female with:  Angioedema- with negative testing to the most Figueroa foods  Seasonal and perennial allergic rhinitis(ragweeds, trees, outdoor molds, cat, dust mite)- withoutany clear clinical reactivity in the form of allergic rhinitis  Fully vaccinated to COVID-19, including a booster   Abigail Figueroa seems to be doing very well on the current regimen.  As long she takes her  levocetirizine every day, she has no issues with hives or swelling.  She does not think that she needs allergy shots, which I agree with at this point in time.  Despite her multiple positives on skin testing, she actually had no clinical symptoms.  She has also undergone allergy shots in the past, albeit only for a year.  We will see her again in 1 year to see how she is  doing.  I did tell her that we can certainly see her in the meantime if she had issues.  Diagnostics: None.  Medication List:  Current Outpatient Medications  Medication Sig Dispense Refill  . amLODipine (NORVASC) 5 MG tablet TAKE 1.5 TABLETS (7.5 MG TOTAL) BY MOUTH DAILY. 135 tablet 3  . baclofen (LIORESAL) 10 MG tablet Take 10 mg by mouth 2 (two) times daily as needed for muscle spasms.     . benzonatate (TESSALON) 200 MG capsule Take 1 capsule (200 mg total) by mouth 2 (two) times daily as needed for cough. 20 capsule 0  . BOTOX 100 UNITS SOLR injection Inject into the muscle every 3 (three) months.     . busPIRone (BUSPAR) 10 MG tablet Take 1 tablet (10 mg total) by mouth 3 (three) times daily. (as needed for anxiety) 30 tablet 1  . clindamycin (CLEOCIN T) 1 % lotion APPLY TOPICALLY ONCE A DAY TO AREAS OF ACNE ON THE FACE    . diphenhydramine-acetaminophen (TYLENOL PM) 25-500 MG TABS tablet Take 1 tablet by mouth at bedtime as needed.    . doxycycline (VIBRA-TABS) 100 MG tablet Take 1 tablet (100 mg total) by mouth 2 (two) times daily. 20 tablet 0  . EMGALITY 120 MG/ML SOAJ Inject into the skin as needed.     Marland Kitchen EPINEPHrine 0.3 mg/0.3 mL IJ SOAJ injection Inject 0.3 mLs (0.3 mg total) into the muscle as needed for up to 1 dose for anaphylaxis (Call 911 if used.). 1 each 1  . ezetimibe (ZETIA) 10 MG tablet TAKE 1 TABLET (10 MG TOTAL) BY MOUTH DAILY. PLEASE SCHEDULE APPOINTMENT WITH DR. Claiborne Billings FOR REFILLS. 30 tablet 0  . fluocinonide ointment (LIDEX) 1.75 % Apply 1 application topically 2 (two) times daily. 30 g 1  . fluticasone (FLONASE) 50 MCG/ACT nasal spray SPRAY 2 SPRAYS INTO EACH NOSTRIL EVERY DAY 48 mL 3  . furosemide (LASIX) 20 MG tablet TAKE 1 TABLET BY MOUTH EVERY DAY 30 tablet 6  . glucose blood (ONETOUCH VERIO) test strip TEST FASTING GLUCOSE DAILY AS DIRECTED 100 each 3  . hydrocortisone 1 % ointment Apply 1 application topically 2 (two) times daily. To areas of itching not to use  if any blisters or open wounds. USE PRN itching. 30 g 0  . lamoTRIgine (LAMICTAL) 100 MG tablet Take 100 mg by mouth 2 (two) times daily.    . Lancets (ONETOUCH ULTRASOFT) lancets Test fasting each morning and 2 hours before supper. Retest if having hypoglycemic symptoms. 100 each 12  . levocetirizine (XYZAL) 5 MG tablet TAKE 1 TABLET BY MOUTh daily as needed 30 tablet 5  . metFORMIN (GLUCOPHAGE) 500 MG tablet TAKE 2 TABLETS BY MOUTH IN THE MORNING ,1 TABLET AT LUNCH, & TAKE 2 TABLETS IN THE EVENING 450 tablet 1  . metoprolol tartrate (LOPRESSOR) 100 MG tablet TAKE 1 TABLET BY MOUTH TWICE A DAY 180 tablet 2  . promethazine (PHENERGAN) 25 MG tablet Take 25 mg by mouth every 4 (four) hours as needed.     . rosuvastatin (CRESTOR) 20 MG  tablet Take 1 tablet (20 mg total) by mouth daily. 90 tablet 3  . TEGRETOL-XR 400 MG 12 hr tablet TAKE 1 TABLET BY MOUTH TWICE A DAY (Patient taking differently: Take 400 mg by mouth 2 (two) times daily.) 180 tablet 0  . VIIBRYD 40 MG TABS TAKE 1 TABLET BY MOUTH EVERY DAY 90 tablet 1   No current facility-administered medications for this visit.   Allergies: Allergies  Allergen Reactions  . Morphine Rash and Swelling  . Morphine And Related Hives    Nausea and vomiting  Nausea and vomiting  . Oxycodone Hives, Itching, Rash and Swelling  . Oxycodone Hcl Swelling, Hives and Itching  . Aimovig [Erenumab-Aooe]     Questionable lip swelling angoiedema  . Morphine Sulfate Itching and Nausea And Vomiting    REACTION: swelling   I reviewed her past medical history, social history, family history, and environmental history and no significant changes have been reported from previous visits.  Review of Systems  Constitutional: Negative for activity change, appetite change, chills and diaphoresis.  HENT: Negative for congestion, postnasal drip, rhinorrhea, sinus pressure and sore throat.   Eyes: Negative for pain, discharge, redness and itching.  Respiratory:  Negative for shortness of breath, wheezing and stridor.   Gastrointestinal: Negative for diarrhea, nausea and vomiting.  Endocrine: Negative for cold intolerance and heat intolerance.  Musculoskeletal: Negative for arthralgias, joint swelling and myalgias.  Skin: Negative for rash.  Allergic/Immunologic: Negative for environmental allergies and food allergies.    Objective:  Physical exam not obtained as encounter was done via telephone.   Previous notes and tests were reviewed.  I discussed the assessment and treatment plan with the patient. The patient was provided an opportunity to ask questions and all were answered. The patient agreed with the plan and demonstrated an understanding of the instructions.   The patient was advised to call back or seek an in-person evaluation if the symptoms worsen or if the condition fails to improve as anticipated.  I provided 27 minutes of non-face-to-face time during this encounter.  It was my pleasure to participate in [place] care today. Please feel free to contact me with any questions or concerns.   Sincerely,  Valentina Shaggy, MD

## 2020-05-25 ENCOUNTER — Other Ambulatory Visit: Payer: Self-pay | Admitting: Physician Assistant

## 2020-05-28 DIAGNOSIS — G43719 Chronic migraine without aura, intractable, without status migrainosus: Secondary | ICD-10-CM | POA: Diagnosis not present

## 2020-06-07 ENCOUNTER — Other Ambulatory Visit: Payer: Self-pay | Admitting: Cardiovascular Disease

## 2020-06-10 DIAGNOSIS — G4733 Obstructive sleep apnea (adult) (pediatric): Secondary | ICD-10-CM | POA: Diagnosis not present

## 2020-06-19 ENCOUNTER — Other Ambulatory Visit: Payer: Self-pay | Admitting: Family Medicine

## 2020-06-19 ENCOUNTER — Other Ambulatory Visit: Payer: Self-pay | Admitting: Cardiovascular Disease

## 2020-06-19 DIAGNOSIS — F331 Major depressive disorder, recurrent, moderate: Secondary | ICD-10-CM

## 2020-07-01 ENCOUNTER — Other Ambulatory Visit: Payer: Self-pay | Admitting: Cardiovascular Disease

## 2020-07-04 ENCOUNTER — Telehealth: Payer: Self-pay | Admitting: Allergy & Immunology

## 2020-07-04 ENCOUNTER — Other Ambulatory Visit: Payer: Self-pay | Admitting: Allergy & Immunology

## 2020-07-04 MED ORDER — PAZEO 0.7 % OP SOLN: 1.0000 [drp] | OPHTHALMIC | 5 refills | Status: DC

## 2020-07-04 MED ORDER — CETIRIZINE HCL 10 MG PO TABS: 10.0000 mg | ORAL_TABLET | Freq: Every day | ORAL | 5 refills | Status: DC

## 2020-07-04 MED ORDER — AZELASTINE HCL 0.15 % NA SOLN: 2.0000 | Freq: Two times a day (BID) | NASAL | 5 refills | Status: DC

## 2020-07-04 NOTE — Telephone Encounter (Signed)
Pt discussed possibility of starting allergy shots on her last visit w/ Dr. Ernst Bowler, if her symptoms have worsened. Pt believes she needs to start allergy shots as she is experiencing the worsening of the following symptoms: Itching in eyes Feels as if throat is going to close Congestion Beginning to see dot circles (eye floaters) in her eyse  Pt would like to know if approved for allergy shots, would she need to come in person or could she do it from home. Also if an OV is needed could she do a telehealth/virtual visit?  Please contact patient directly, [phone].  Please advise.

## 2020-07-04 NOTE — Telephone Encounter (Signed)
Please advise 

## 2020-07-04 NOTE — Telephone Encounter (Signed)
I called the patient to clarify. She is having eye itching and she is having dark circles under her eyes. She is having some sensation of throat closing at night, due to the phlegm. Throat lozenge helps some. But once she gets through the night and in the early part of the day, it is is fairly good.   She is NOT having any floaters or eye pain or or photophobia whatsoever.   She is on levocetirizine daily. She is not on an eye drop. She is open to some medication changes instead of doing allergy shots. She lives in [place] so the driving would be difficult for her.   Plan: add on cetirizine at night. Continue with levocetirizine in the morning. Add on Pazeo one drop per eye daily.  Confirmed pharmacy. Made a follow up appointment.   Salvatore Marvel, MD [facility]

## 2020-07-07 ENCOUNTER — Other Ambulatory Visit: Payer: Self-pay | Admitting: Cardiovascular Disease

## 2020-07-07 NOTE — Chronic Care Management (AMB) (Signed)
Chronic Care Management   Follow Up Note    Name: Abigail Figueroa MRN: [id number] DOB: August 02, 1960  Primary Care Provider: Margo Common, PA-C Reason for referral : Chronic Care Management   Abigail Figueroa is a 60 y.o. year old female who is a primary care patient of Chrismon, Vickki Muff, PA-C. She is currently enrolled in the Chronic Care Management program.  Review of Ms. Soderman's status, including review of consultants reports, relevant labs and test results was conducted today. Collaboration with appropriate care team members was performed as part of the comprehensive evaluation and provision of chronic care management services.    SDOH (Social Determinants of Health) assessments performed: No See Care Plan activities for detailed interventions related to [facility])     Outpatient Encounter Medications as of 05/20/2020  Medication Sig  . amLODipine (NORVASC) 5 MG tablet TAKE 1.5 TABLETS (7.5 MG TOTAL) BY MOUTH DAILY.  . baclofen (LIORESAL) 10 MG tablet Take 10 mg by mouth 2 (two) times daily as needed for muscle spasms.   . benzonatate (TESSALON) 200 MG capsule Take 1 capsule (200 mg total) by mouth 2 (two) times daily as needed for cough.  Marland Kitchen BOTOX 100 UNITS SOLR injection Inject into the muscle every 3 (three) months.   . busPIRone (BUSPAR) 10 MG tablet Take 1 tablet (10 mg total) by mouth 3 (three) times daily. (as needed for anxiety)  . clindamycin (CLEOCIN T) 1 % lotion APPLY TOPICALLY ONCE A DAY TO AREAS OF ACNE ON THE FACE  . diphenhydramine-acetaminophen (TYLENOL PM) 25-500 MG TABS tablet Take 1 tablet by mouth at bedtime as needed.  . doxycycline (VIBRA-TABS) 100 MG tablet Take 1 tablet (100 mg total) by mouth 2 (two) times daily.  Marland Kitchen EMGALITY 120 MG/ML SOAJ Inject into the skin as needed.  (Patient not taking: Reported on 05/22/2020)  . EPINEPHrine 0.3 mg/0.3 mL IJ SOAJ injection Inject 0.3 mLs (0.3 mg total) into the muscle as needed for up to 1 dose for anaphylaxis  (Call 911 if used.).  Marland Kitchen fluocinonide ointment (LIDEX) 9.98 % Apply 1 application topically 2 (two) times daily.  . fluticasone (FLONASE) 50 MCG/ACT nasal spray SPRAY 2 SPRAYS INTO EACH NOSTRIL EVERY DAY  . furosemide (LASIX) 20 MG tablet TAKE 1 TABLET BY MOUTH EVERY DAY  . glucose blood (ONETOUCH VERIO) test strip TEST FASTING GLUCOSE DAILY AS DIRECTED  . hydrocortisone 1 % ointment Apply 1 application topically 2 (two) times daily. To areas of itching not to use if any blisters or open wounds. USE PRN itching.  . lamoTRIgine (LAMICTAL) 100 MG tablet Take 100 mg by mouth 2 (two) times daily.  . promethazine (PHENERGAN) 25 MG tablet Take 25 mg by mouth every 4 (four) hours as needed.   . rosuvastatin (CRESTOR) 20 MG tablet Take 1 tablet (20 mg total) by mouth daily.  . TEGRETOL-XR 400 MG 12 hr tablet TAKE 1 TABLET BY MOUTH TWICE A DAY (Patient taking differently: Take 400 mg by mouth 2 (two) times daily.)  . [DISCONTINUED] ezetimibe (ZETIA) 10 MG tablet TAKE 1 TABLET (10 MG TOTAL) BY MOUTH DAILY. PLEASE SCHEDULE APPOINTMENT WITH DR. Claiborne Billings FOR REFILLS.  . [DISCONTINUED] Lancets (ONETOUCH ULTRASOFT) lancets Test fasting each morning and 2 hours before supper. Retest if having hypoglycemic symptoms.  . [DISCONTINUED] levocetirizine (XYZAL) 5 MG tablet TAKE 1 TABLET BY MOUTH TWICE A DAY  . [DISCONTINUED] metFORMIN (GLUCOPHAGE) 500 MG tablet TAKE 2 TABLETS BY MOUTH IN THE MORNING ,1 TABLET AT LUNCH, &  TAKE 2 TABLETS IN THE EVENING  . [DISCONTINUED] metoprolol tartrate (LOPRESSOR) 100 MG tablet TAKE 1 TABLET BY MOUTH TWICE A DAY  . [DISCONTINUED] VIIBRYD 40 MG TABS TAKE 1 TABLET BY MOUTH EVERY DAY   No facility-administered encounter medications on file as of 05/20/2020.     Objective:  Goals Addressed            This Visit's Progress   . Chronic Care Management       Current Barriers:  . Chronic Disease Management support and education needs related to Congestive Cardiac Failure, Hypertension,  Diabetes, Mixed Hyperlipidemia  Nurse Case Manager Clinical Goal(s):  Over the next 120 days, patient: . Will not require hospitalization or emergent care d/t complications r/t chronic illnesses. . Will monitor BP and record readings. . Will monitor FBS daily and maintain a log. . Will weigh at least once a week and record readings. . Will continue compliance with a recommended heart healthy/diabetic diet. . Will take all medications as prescribed. . Will attend medical appointments as scheduled. . Will adhere to recommended safety precautions to prevent accidental falls and injuries. Over the next 2 weeks, patient will: . Follow-up with [facility] regarding glucometer supplies. . Receive a Covid-19 vaccine booster.   Interventions:  . Inter-disciplinary care team collaboration (see longitudinal plan of care) . Reviewed medications. Reports taking as prescribed. Reports needing lancets. Pharmacy was contacted today to refill. Denies concerns regarding medication management or prescription cost.   . Discussed s/sx of complications r/t Congestive cardiac failure. Reports taking diuretic. No concerns regarding increased edema. Denies chest pain, palpitations, shortness of breath or decline in activity tolerance.   . Discussed blood glucose readings and compliance with recommended heart healthy/diabetic diet. Reports not monitoring recently d/t not having lancets. Pharmacy was contacted today. Reports improvements with her dietary intake. Encouraged to consistently monitor blood glucose levels and record readings once lancets are received.   . Reviewed safety and fall prevention measures. Reports ambulating with caution d/t right knee pain. She continues to receive joint injections. Reports the injections have been effective in the past but noticed that her right knee seems more painful with movement. She agreed to notify a provider if the pain worsens.    Patient Self Care Activities:   . Patient will self administer medications. . Patient will attend all scheduled provider appointments . Patient will call pharmacy for medication refills . Patient will continue to perform ADL's independently . Patient will continue to perform IADL's independently . Patient will call provider office for new concerns or questions .   Please see past updates related to this goal by clicking on the "Past Updates" button in the selected goal           PLAN A member of the care management team will follow up with Ms. Degroff within the next 60 days.    [name] Care Management [facility] [phone]

## 2020-07-07 NOTE — Patient Instructions (Addendum)
Thank you for allowing the Chronic Care Management team to participate in your care.    Goals Addressed            This Visit's Progress   . Chronic Care Management       Current Barriers:  . Chronic Disease Management support and education needs related to Congestive Cardiac Failure, Hypertension, Diabetes, Mixed Hyperlipidemia  Nurse Case Manager Clinical Goal(s):  Over the next 120 days, patient: . Will not require hospitalization or emergent care d/t complications r/t chronic illnesses. . Will monitor BP and record readings. . Will monitor FBS daily and maintain a log. . Will weigh at least once a week and record readings. . Will continue compliance with a recommended heart healthy/diabetic diet. . Will take all medications as prescribed. . Will attend medical appointments as scheduled. . Will adhere to recommended safety precautions to prevent accidental falls and injuries. Over the next 2 weeks, patient will: . Follow-up with [facility] regarding glucometer supplies. . Receive a Covid-19 vaccine booster.   Interventions:  . Inter-disciplinary care team collaboration (see longitudinal plan of care) . Reviewed medications. Reports taking as prescribed. Reports needing lancets. Pharmacy was contacted today to refill. Denies concerns regarding medication management or prescription cost.   . Discussed s/sx of complications r/t Congestive cardiac failure. Reports taking diuretic. No concerns regarding increased edema. Denies chest pain, palpitations, shortness of breath or decline in activity tolerance.   . Discussed blood glucose readings and compliance with recommended heart healthy/diabetic diet. Reports not monitoring recently d/t not having lancets. Pharmacy was contacted today. Reports improvements with her dietary intake. Encouraged to consistently monitor blood glucose levels and record readings once lancets are received.   . Reviewed safety and fall prevention  measures. Reports ambulating with caution d/t right knee pain. She continues to receive joint injections. Reports the injections have been effective in the past but noticed that her right knee seems more painful with movement. She agreed to notify a provider if the pain worsens.    Patient Self Care Activities:  . Patient will self administer medications. . Patient will attend all scheduled provider appointments . Patient will call pharmacy for medication refills . Patient will continue to perform ADL's independently . Patient will continue to perform IADL's independently . Patient will call provider office for new concerns or questions .   Please see past updates related to this goal by clicking on the "Past Updates" button in the selected goal           Abigail Figueroa verbalized understanding of the information discussed during the telephonic outreach today. Declined need for mailed/printed instructions. A member of the care management team will follow up with Abigail Figueroa within the next 60 days.    [name] Care Management [place] [phone]

## 2020-07-08 ENCOUNTER — Other Ambulatory Visit: Payer: Self-pay | Admitting: Allergy & Immunology

## 2020-07-08 ENCOUNTER — Other Ambulatory Visit: Payer: Self-pay

## 2020-07-08 MED ORDER — PAZEO 0.7 % OP SOLN: OPHTHALMIC | 3 refills | Status: DC

## 2020-07-09 ENCOUNTER — Other Ambulatory Visit: Payer: Self-pay | Admitting: Allergy & Immunology

## 2020-07-09 ENCOUNTER — Telehealth: Payer: Self-pay

## 2020-07-09 NOTE — Telephone Encounter (Signed)
Patient called stating the pazeo isn't covered by her insurance company. Patient states the pharmacy may have sent something to our office regarding this denial.  Patient is wondering what other eye drop she could try?  Please advise

## 2020-07-09 NOTE — Telephone Encounter (Signed)
Informed pt that olopatadine is otc and a lot of insurances are not covering eye drops as they have went otc also she wanted to know about the zyrtex as it is exspensive by rx and I explained she can also get it over the counter as zyrtec or cetirizine pt stated understanding

## 2020-07-10 ENCOUNTER — Ambulatory Visit: Payer: Self-pay

## 2020-07-10 DIAGNOSIS — E119 Type 2 diabetes mellitus without complications: Secondary | ICD-10-CM

## 2020-07-10 NOTE — Chronic Care Management (AMB) (Signed)
  Chronic Care Management   Outreach Note  07/10/2020 Name: Abigail Figueroa MRN: [id number] DOB: 23-Feb-1961  Primary Care Provider: Margo Common, PA-C Reason for referral : Chronic Care Management   [name] is currently enrolled in the Chronic Care Management program. A routine outreach was attempted today. She reports feeling well but prefers to complete outreach at a later date. Agreeable to outreach later this month. Encouraged to call with questions if needed prior to her appointment.    Follow Up Plan:  Will follow up on 07/25/20.    [name] Care Management [facility] [phone]

## 2020-07-13 ENCOUNTER — Other Ambulatory Visit: Payer: Self-pay | Admitting: Cardiovascular Disease

## 2020-07-21 ENCOUNTER — Other Ambulatory Visit: Payer: Self-pay | Admitting: Family Medicine

## 2020-07-21 NOTE — Telephone Encounter (Signed)
Notes to clinic:  This refill cannot be delegated This script is expired  Review for continued use    Requested Prescriptions  Pending Prescriptions Disp Refills   TEGRETOL-XR 400 MG 12 hr tablet [Pharmacy Med Name: TEGRETOL XR 400 MG TABLET] 180 tablet 0    Sig: TAKE 1 TABLET BY MOUTH TWICE A DAY      Not Delegated - Neurology:  Anticonvulsants - carbamazepine Failed - 07/21/2020  9:51 AM      Failed - This refill cannot be delegated      Failed - AST in normal range and within 90 days    AST  Date Value Ref Range Status  02/05/2020 38 0 - 40 IU/L Final   SGOT(AST)  Date Value Ref Range Status  08/16/2014 54 (H) U/L Final    Comment:    15-41 NOTE: New Reference Range  07/09/14           Failed - ALT in normal range and within 90 days    ALT  Date Value Ref Range Status  02/05/2020 37 (H) 0 - 32 IU/L Final   SGPT (ALT)  Date Value Ref Range Status  08/16/2014 49 U/L Final    Comment:    14-54 NOTE: New Reference Range  07/09/14           Failed - Carbamazepine (serum) in normal range and within 360 days    Carbamazepine, Total  Date Value Ref Range Status  08/09/2013 4.9 4.0 - 12.0 mcg/mL Final   Carbamazepine Lvl  Date Value Ref Range Status  01/23/2016 4.4 4.0 - 12.0 ug/mL Final          Failed - WBC in normal range and within 90 days    WBC  Date Value Ref Range Status  02/05/2020 5.3 3.4 - 10.8 x10E3/uL Final  02/27/2019 5.3 4.0 - 10.5 K/uL Final          Failed - PLT in normal range and within 90 days    Platelets  Date Value Ref Range Status  02/05/2020 238 150 - 450 x10E3/uL Final          Failed - HGB in normal range and within 90 days    Hemoglobin  Date Value Ref Range Status  02/05/2020 11.7 11.1 - 15.9 g/dL Final          Failed - Na in normal range and within 90 days    Sodium  Date Value Ref Range Status  02/05/2020 140 134 - 144 mmol/L Final  08/16/2014 144 mmol/L Final    Comment:    135-145 NOTE: New Reference  Range  07/09/14           Failed - HCT in normal range and within 90 days    Hematocrit  Date Value Ref Range Status  02/05/2020 35.4 34.0 - 46.6 % Final          Passed - Valid encounter within last 12 months    Recent Outpatient Visits           2 months ago Viral URI with cough   [place], Vickki Muff, PA-C   3 months ago Suspected COVID-19 virus infection   [facility], Vickki Muff, PA-C   4 months ago Anxiety, generalized   [facility], Vickki Muff, PA-C   5 months ago Chronic pain of both knees   [facility], Vickki Muff, PA-C   7 months ago  Herpes zoster without complication   [facility], Kelby Aline, FNP       Future Appointments             In 3 weeks Troy Sine, MD [facility] [place], [place]   In 2 months Ernst Bowler Gwenith Daily, MD [facility] [place]   In 10 months Valentina Shaggy, MD [facility] [place]

## 2020-07-24 DIAGNOSIS — M542 Cervicalgia: Secondary | ICD-10-CM | POA: Diagnosis not present

## 2020-07-24 DIAGNOSIS — G43719 Chronic migraine without aura, intractable, without status migrainosus: Secondary | ICD-10-CM | POA: Diagnosis not present

## 2020-07-24 DIAGNOSIS — M791 Myalgia, unspecified site: Secondary | ICD-10-CM | POA: Diagnosis not present

## 2020-07-25 ENCOUNTER — Telehealth: Payer: Self-pay

## 2020-07-25 NOTE — Telephone Encounter (Signed)
  Chronic Care Management   Outreach Note  07/25/2020 Name: NICKI FURLAN MRN: [id number] DOB: 02-Apr-1961  Primary Care Provider: Margo Common, PA-C Reason for referral : Chronic Care Management   An unsuccessful telephone outreach was attempted today. Mrs. Yarrow is currently enrolled in the chronic care management program.    Follow Up Plan:  A HIPAA compliant voice message was left today requesting a return call.    [name] [phone]

## 2020-07-29 DIAGNOSIS — H18603 Keratoconus, unspecified, bilateral: Secondary | ICD-10-CM | POA: Diagnosis not present

## 2020-07-29 DIAGNOSIS — E113299 Type 2 diabetes mellitus with mild nonproliferative diabetic retinopathy without macular edema, unspecified eye: Secondary | ICD-10-CM | POA: Diagnosis not present

## 2020-07-29 DIAGNOSIS — E113293 Type 2 diabetes mellitus with mild nonproliferative diabetic retinopathy without macular edema, bilateral: Secondary | ICD-10-CM | POA: Diagnosis not present

## 2020-07-29 DIAGNOSIS — H5010 Unspecified exotropia: Secondary | ICD-10-CM | POA: Diagnosis not present

## 2020-08-06 ENCOUNTER — Other Ambulatory Visit: Payer: Self-pay | Admitting: Family Medicine

## 2020-08-06 DIAGNOSIS — F411 Generalized anxiety disorder: Secondary | ICD-10-CM

## 2020-08-08 ENCOUNTER — Other Ambulatory Visit: Payer: Self-pay | Admitting: Cardiovascular Disease

## 2020-08-15 ENCOUNTER — Ambulatory Visit (INDEPENDENT_AMBULATORY_CARE_PROVIDER_SITE_OTHER): Payer: Medicare Other | Admitting: Cardiovascular Disease

## 2020-08-15 ENCOUNTER — Other Ambulatory Visit: Payer: Self-pay

## 2020-08-15 ENCOUNTER — Encounter: Payer: Self-pay | Admitting: Cardiovascular Disease

## 2020-08-15 VITALS — BP 146/70 | HR 60 | Ht 63.0 in | Wt 219.0 lb

## 2020-08-15 DIAGNOSIS — G4733 Obstructive sleep apnea (adult) (pediatric): Secondary | ICD-10-CM | POA: Diagnosis not present

## 2020-08-15 DIAGNOSIS — E119 Type 2 diabetes mellitus without complications: Secondary | ICD-10-CM

## 2020-08-15 DIAGNOSIS — E1169 Type 2 diabetes mellitus with other specified complication: Secondary | ICD-10-CM

## 2020-08-15 DIAGNOSIS — E782 Mixed hyperlipidemia: Secondary | ICD-10-CM

## 2020-08-15 DIAGNOSIS — I10 Essential (primary) hypertension: Secondary | ICD-10-CM

## 2020-08-15 DIAGNOSIS — E66812 Obesity, class 2: Secondary | ICD-10-CM

## 2020-08-15 DIAGNOSIS — E669 Obesity, unspecified: Secondary | ICD-10-CM

## 2020-08-15 NOTE — Patient Instructions (Signed)
Medication Instructions:  Your physician recommends that you continue on your current medications as directed. Please refer to the Current Medication list given to you today.  *If you need a refill on your cardiac medications before your next appointment, please call your pharmacy*   Lab Work: CMET, CBC, TSH, Lipid, HmgA1C  If you have labs (blood work) drawn today and your tests are completely normal, you will receive your results only by: Marland Kitchen MyChart Message (if you have MyChart) OR . A paper copy in the mail If you have any lab test that is abnormal or we need to change your treatment, we will call you to review the results.  Follow-Up: At [facility], you and your health needs are our priority.  As part of our continuing mission to provide you with exceptional heart care, we have created designated Provider Care Teams.  These Care Teams include your primary Cardiologist (physician) and Advanced Practice Providers (APPs -  Physician Assistants and Nurse Practitioners) who all work together to provide you with the care you need, when you need it.  We recommend signing up for the patient portal called "MyChart".  Sign up information is provided on this After Visit Summary.  MyChart is used to connect with patients for Virtual Visits (Telemedicine).  Patients are able to view lab/test results, encounter notes, upcoming appointments, etc.  Non-urgent messages can be sent to your provider as well.   To learn more about what you can do with MyChart, go to [email or web address].    Your next appointment:   3-4 month(s)  The format for your next appointment:   In Person  Provider:   Shelva Majestic, MD   Other Instructions CPAP settings changed to Auto 8-16 cm H20

## 2020-08-15 NOTE — Progress Notes (Signed)
Cardiology Office Note    Date:  08/16/2020   ID:  SHAINNA FAUX, DOB 10-18-1960, MRN [id number]  PCP:  Margo Common, PA-C  Cardiologist:  Shelva Majestic, MD   65-monthfollow-up evaluation.  History of Present Illness:  DLARRI BREWTONis a 60y.o. female who has a history of hypertension, palpitations, peripheral edema, hyperlipidemia, type 2 diabetes mellitus, depression, as well as migraines. In 2007, she underwent a sleep study which was interpreted by Dr. KChancy Milroy I do not have the diagnostic polysomnogram report but I do have the CPAP titration trial. She ultimately was titrated up to an 8 cm water pressure. She states she initially used CPAP therapy for several years but had not used in years. When I saw her in October 2014 her sleep had been very poor for several years and she had with frequent awakenings and loud snoring.   At that time, Her Epworth sleepiness scale revealed severe excessive daytime sleepiness.  Epworth Sleepiness Scale: Situation  Chance of Dozing/Sleeping (0 = never , 1 = slight chance , 2 = moderate chance , 3 = high chance )  sitting and reading 3  watching TV 3  sitting inactive in a public place 3  being a passenger in a motor vehicle for an hour or more 3  lying down in the afternoon 3  sitting and talking to someone 3  sitting quietly after lunch (no alcohol) 3  while stopped for a few minutes in traffic as the driver 0  Total Score  21   She received a S9CPAPunitbut ultimately again stopped using treatment.In April 2017, she underwent preoperative clearance nuclear study prior to undergoing lumbar back surgery. This study was interpreted as intermediate risk with a possible defect in the mid, distal anterior wall and apex and there was distal inferior hypokinesis. She ultimately was referred for cardiac catheterization which I performed on 08/06/2015 and revealed normal coronary arteries.  [facility] She was treated with steroids.  I  saw her in January 2018. At that time I discussed the importance of resuming CPAP therapy and since it is been over 11 years since her initial evaluation I recommend repeat assessment. Sleep study was done on November 08, 2017 which again confirmed severe sleep apnea with an AHI of 30.5/h and RDI of 42.5/h. Events were more severe during REM sleep with an AHI of 76.1/h. She had significant oxygen desaturation to a nadir of 71%. CPAP was initiated and was titrated up to 11 cm. She received a new ResMed AirSense 10 AutoSet CPAP unit with set up date on December 09, 2017. She had worn the CPAP initially but but then developed a dermatitis/rash on her face for which she underwent dermatologic evaluation by Dr. DBaxter Kailwas felt to have contact dermatitis. The patient felt that the mask was potentially contributing to this. As result, recently she is only rarely been using CPAP. A new download was obtained in the office  from February 14, 2018 through March 15, 2008.This confirmed poor compliance with usage at only 10% over the last 30 days with 3 hours and 58 minutes of average use. AHI however was excellent at 0.8 on 11 cm water pressure.  When I saw her in March 20, 2018 I stressed the importance of meeting compliance standards with CPAP use.  I discussed the negative cardiovascular consequences of untreated sleep apnea with reference to her health.  I recommend changing her mask to a DreamWear with chinstrap to provide minimal contact to her face to reduce potential contact dermatitis. She was on atorvastatin for hyperlipidemia and had not had recent laboratory.  She also was on amlodipine 5 mg, metoprolol 75 mg twice a day for hypertension and is diabetic on metformin.    She was last evaluated by me in telemedicine encounters in May 2020 and most recently in October 2020.  During her October 2020  encounter, blood pressure was elevated and I recommended titration of metoprolol from 75 mg twice a day to 100 mg twice a day.  On her most recent download, AHI was excellent at 1.6 and 11 cm of water pressure the use was only 4 hours and 40 minutes.  Her LDL cholesterol was 105 despite being on atorvastatin I recommended the addition of Zetia 10 mg.  Since I saw her, she apparently stopped using CPAP therapy.  She believes she has not been on therapy since mid 2021.  A download from March 4 through October 02, 2019 had shown an AHI of 2.4 at 11 cm water pressure.  She states her machine began to make a loud noise and she stopped using it.  Presently she is not sleeping well.  She goes to bed between 10 PM and midnight, has nocturia at least 3 times per night and wakes up between 6 and 8 AM.  She has daytime sleepiness.  Past Medical History:  Diagnosis Date  . Abnormal stress test    a. 10/2009: Normal myocardial perfusion imaging; b. 08/2015 MV: medium defect of mod severity in mid ant apical region w/ mild HK of the distal inf wall;  c. 08/2015 Cath: nl Cors, EF 60%.  . Anemia   . Angio-edema   . Anxiety   . Arthritis   . Bell's palsy   . Depression   . Edema   . Essential hypertension   . Frequent urination   . Frequent urination at night   . Headache    migraines, gets botox injections  . Heart palpitations June 2013   Event monitor showing sinus tachycardia, PACs with couplets and triplets.  Marland Kitchen Hx of echocardiogram    a. 10/2009 Echo: showed normal left ventricular function, mild left ventricular hypertrophy, and no significant valve abnormalities.  . Hypercholesterolemia   . Migraine   . Morbid obesity ([place])   . Neuromuscular disorder ([place])   . OSA (obstructive sleep apnea)    has been on continuous positive airway pressure  . Seizure disorder ([place])   . Seizures ([place])   . Type II diabetes mellitus ([place])   . Urticaria     Past Surgical History:  Procedure Laterality Date  .  ABDOMINAL HYSTERECTOMY  1990   without BSO  . BACK SURGERY  1900s 2001   x2 with "cage put in"  . BILATERAL SALPINGOOPHORECTOMY  1990  . CARDIAC CATHETERIZATION N/A 08/06/2015   Procedure: Left Heart Cath and Coronary Angiography;  Surgeon: Troy Sine, MD;  Location: [facility] [facility];  Service: Cardiovascular;  Laterality: N/A;  . CARPAL TUNNEL RELEASE Bilateral   . CHOLECYSTECTOMY  1980  . COLONOSCOPY WITH PROPOFOL N/A 08/30/2017   Procedure: COLONOSCOPY WITH PROPOFOL;  Surgeon: Lin Landsman, MD;  Location: [facility];  Service: Endoscopy;  Laterality: N/A;  . DG THUMB LEFT HAND  01/24/2018   repair joint in left thumb  . DORSAL COMPARTMENT RELEASE Left 10/25/2014   Procedure: LEFT FIRST  DORSAL COMPARTMENT  RELEASE AND RADIAL TENOSYNOVECTOMY ;  Surgeon: Roseanne Kaufman, MD;  Location: [place];  Service: Orthopedics;  Laterality: Left;  . HAND SURGERY    . KNEE SURGERY Right 2012   meniscus tear  . KNEE SURGERY  2012  . LAMINECTOMY  1995  . TUBAL LIGATION  1982  . WRIST SURGERY Right     Current Medications: Outpatient Medications Prior to Visit  Medication Sig Dispense Refill  . amLODipine (NORVASC) 5 MG tablet TAKE 1.5 TABLETS (7.5 MG TOTAL) BY MOUTH DAILY. 45 tablet 2  . baclofen (LIORESAL) 10 MG tablet Take 10 mg by mouth 2 (two) times daily as needed for muscle spasms.     . benzonatate (TESSALON) 200 MG capsule Take 1 capsule (200 mg total) by mouth 2 (two) times daily as needed for cough. 20 capsule 0  . BOTOX 100 UNITS SOLR injection Inject into the muscle every 3 (three) months.     . busPIRone (BUSPAR) 10 MG tablet TAKE 1 TABLET (10 MG TOTAL) BY MOUTH 3 (THREE) TIMES DAILY. (AS NEEDED FOR ANXIETY) 30 tablet 1  . clindamycin (CLEOCIN T) 1 % lotion APPLY TOPICALLY ONCE A DAY TO AREAS OF ACNE ON THE FACE    . doxycycline (VIBRA-TABS) 100 MG tablet Take 1 tablet (100 mg total) by mouth 2 (two) times daily. 20 tablet 0  . EMGALITY 120 MG/ML SOAJ  Inject into the skin as needed.    Marland Kitchen EPINEPHrine 0.3 mg/0.3 mL IJ SOAJ injection Inject 0.3 mLs (0.3 mg total) into the muscle as needed for up to 1 dose for anaphylaxis (Call 911 if used.). 1 each 1  . ezetimibe (ZETIA) 10 MG tablet TAKE 1 TABLET (10 MG TOTAL) BY MOUTH DAILY. PLEASE SCHEDULE APPOINTMENT WITH DR. Claiborne Billings FOR REFILLS. 90 tablet 1  . fluocinonide ointment (LIDEX) 0.22 % Apply 1 application topically 2 (two) times daily. 30 g 1  . furosemide (LASIX) 20 MG tablet Take 1 tablet (20 mg total) by mouth daily. (Patient taking differently: Take 20 mg by mouth 2 (two) times daily.) 90 tablet 1  . glucose blood (ONETOUCH VERIO) test strip TEST FASTING GLUCOSE DAILY AS DIRECTED 100 each 3  . hydrocortisone 1 % ointment Apply 1 application topically 2 (two) times daily. To areas of itching not to use if any blisters or open wounds. USE PRN itching. 30 g 0  . lamoTRIgine (LAMICTAL) 100 MG tablet Take 100 mg by mouth 2 (two) times daily.    . Lancets (ONETOUCH ULTRASOFT) lancets Test fasting each morning and 2 hours before supper. Retest if having hypoglycemic symptoms. 100 each 12  . levocetirizine (XYZAL) 5 MG tablet TAKE 1 TABLET BY MOUTh daily as needed 30 tablet 5  . metFORMIN (GLUCOPHAGE) 500 MG tablet TAKE 2 TABLETS BY MOUTH IN THE MORNING ,1 TABLET AT LUNCH, & TAKE 2 TABLETS IN THE EVENING 450 tablet 1  . metoprolol tartrate (LOPRESSOR) 100 MG tablet TAKE 1 TABLET (100 MG TOTAL) BY MOUTH 2 (TWO) TIMES DAILY. KEEP OV. 60 tablet 0  . promethazine (PHENERGAN) 25 MG tablet Take 25 mg by mouth every 4 (four) hours as needed.     . rosuvastatin (CRESTOR) 20 MG tablet Take 1 tablet (20 mg total) by mouth daily. 90 tablet 3  . TEGRETOL-XR 400 MG 12 hr tablet TAKE 1 TABLET BY MOUTH TWICE A DAY 180 tablet 0  . VIIBRYD 40 MG TABS TAKE 1 TABLET BY MOUTH EVERY DAY 90 tablet 1  . cetirizine (ZYRTEC)  10 MG tablet Take 1 tablet (10 mg total) by mouth daily. 30 tablet 5  . diphenhydramine-acetaminophen  (TYLENOL PM) 25-500 MG TABS tablet Take 1 tablet by mouth at bedtime as needed.    . fluticasone (FLONASE) 50 MCG/ACT nasal spray SPRAY 2 SPRAYS INTO EACH NOSTRIL EVERY DAY 48 mL 3  . PAZEO 0.7 % SOLN PLACE 1 DROP INTO BOTH EYES 1 DAY OR 1 DOSE. (**NOT COVERED) 2.5 mL 3  . Azelastine HCl 0.15 % SOLN Place 2 sprays into both nostrils 2 (two) times daily. 30 mL 5   No facility-administered medications prior to visit.     Allergies:   Morphine, Morphine and related, Oxycodone, Oxycodone hcl, Aimovig [erenumab-aooe], and Morphine sulfate   Social History   Socioeconomic History  . Marital status: Married    Spouse name: Not on file  . Number of children: 2  . Years of education: Not on file  . Highest education level: 12th grade  Occupational History  . Occupation: disabled  Tobacco Use  . Smoking status: Former Smoker    Types: Cigarettes    Quit date: 01/03/1983    Years since quitting: 37.6  . Smokeless tobacco: Never Used  . Tobacco comment: quit in 1984  Vaping Use  . Vaping Use: Never used  Substance and Sexual Activity  . Alcohol use: No    Alcohol/week: 0.0 standard drinks  . Drug use: No  . Sexual activity: Never  Other Topics Concern  . Not on file  Social History Narrative   ** Merged History Encounter **       She is a married mother of 2, grandmother 3. She tries to get exercise but is not doing any routine program.   She quit smoking over 25 years ago does not drink alcohol.   Social Determinants of Health   Financial Resource Strain: Medium Risk  . Difficulty of Paying Living Expenses: Somewhat hard  Food Insecurity: No Food Insecurity  . Worried About Charity fundraiser in the Last Year: Never true  . Ran Out of Food in the Last Year: Never true  Transportation Needs: No Transportation Needs  . Lack of Transportation (Medical): No  . Lack of Transportation (Non-Medical): No  Physical Activity: Inactive  . Days of Exercise per Week: 0 days  . Minutes of  Exercise per Session: 0 min  Stress: Stress Concern Present  . Feeling of Stress : To some extent  Social Connections: Moderately Integrated  . Frequency of Communication with Friends and Family: More than three times a week  . Frequency of Social Gatherings with Friends and Family: More than three times a week  . Attends Religious Services: Never  . Active Member of Clubs or Organizations: Yes  . Attends Archivist Meetings: More than 4 times per year  . Marital Status: Married     Family History:  The patient's family history includes Alzheimer's disease in her maternal grandmother; Dementia in her maternal grandmother; Diabetes in her maternal grandfather; Heart attack in her mother; Hypertension in her maternal grandfather; Sarcoidosis in her mother; Seizures in her mother.   ROS General: Negative; No fevers, chills, or night sweats;  HEENT: Negative; No changes in vision or hearing, sinus congestion, difficulty swallowing Pulmonary: Negative; No cough, wheezing, shortness of breath, hemoptysis Cardiovascular: Negative; No chest pain, presyncope, syncope, palpitations GI: Negative; No nausea, vomiting, diarrhea, or abdominal pain GU: Negative; No dysuria, hematuria, or difficulty voiding Musculoskeletal: Negative; no myalgias, joint pain, or  weakness Hematologic/Oncology: Negative; no easy bruising, bleeding Endocrine: Negative; no heat/cold intolerance; no diabetes Neuro: Negative; no changes in balance, headaches Skin: Negative; No rashes or skin lesions Psychiatric: Negative; No behavioral problems, depression Sleep: Positive for OSA, currently untreated with complaints of snoring, daytime sleepiness, hypersomnolence, no bruxism, restless legs, hypnogognic hallucinations, no cataplexy Other comprehensive 14 point system review is negative.   PHYSICAL EXAM:   VS:  BP (!) 146/70   Pulse 60   Ht 5' 3"  (1.6 m)   Wt 219 lb (99.3 kg)   LMP  (LMP Unknown)   SpO2 98%    BMI 38.79 kg/m     Repeat blood pressure by me 142/74  Wt Readings from Last 3 Encounters:  08/15/20 219 lb (99.3 kg)  05/22/20 209 lb (94.8 kg)  02/05/20 225 lb (102.1 kg)    General: Alert, oriented, no distress.  Skin: normal turgor, no rashes, warm and dry HEENT: Normocephalic, atraumatic. Pupils equal round and reactive to light; sclera anicteric; extraocular muscles intact;  Nose without nasal septal hypertrophy Mouth/Parynx benign; Mallinpatti scale 3 Neck: No JVD, no carotid bruits; normal carotid upstroke Lungs: clear to ausculatation and percussion; no wheezing or rales Chest wall: without tenderness to palpitation Heart: PMI not displaced, RRR, s1 s2 normal, 1/6 systolic murmur, no diastolic murmur, no rubs, gallops, thrills, or heaves Abdomen: soft, nontender; no hepatosplenomehaly, BS+; abdominal aorta nontender and not dilated by palpation. Back: no CVA tenderness Pulses 2+ Musculoskeletal: full range of motion, normal strength, no joint deformities Extremities: no clubbing cyanosis or edema, Homan's sign negative  Neurologic: grossly nonfocal; Cranial nerves grossly wnl Psychologic: Normal mood and affect   Studies/Labs Reviewed:   EKG:  EKG is ordered today.  ECG (independently read by me): NSR at 60; nonspecific T wave  Recent Labs: BMP Latest Ref Rng & Units 08/15/2020 02/05/2020 10/11/2019  Glucose 65 - 99 mg/dL 96 179(H) 132(H)  BUN 6 - 24 mg/dL 34(H) 19 22  Creatinine 0.57 - 1.00 mg/dL 1.55(H) 1.06(H) 1.33(H)  BUN/Creat Ratio 9 - 23 22 18 17   Sodium 134 - 144 mmol/L 142 140 145(H)  Potassium 3.5 - 5.2 mmol/L 4.8 4.6 4.4  Chloride 96 - 106 mmol/L 101 103 108(H)  CO2 20 - 29 mmol/L 20 23 22   Calcium 8.7 - 10.2 mg/dL 10.7(H) 10.3(H) 10.4(H)     Hepatic Function Latest Ref Rng & Units 08/15/2020 02/05/2020 10/11/2019  Total Protein 6.0 - 8.5 g/dL 7.5 7.4 7.4  Albumin 3.8 - 4.9 g/dL 4.8 4.3 4.4  AST 0 - 40 IU/L 26 38 23  ALT 0 - 32 IU/L 26 37(H) 28  Alk  Phosphatase 44 - 121 IU/L 63 82 84  Total Bilirubin 0.0 - 1.2 mg/dL <0.2 <0.2 <0.2    CBC Latest Ref Rng & Units 08/15/2020 02/05/2020 06/26/2019  WBC 3.4 - 10.8 x10E3/uL 6.3 5.3 4.9  Hemoglobin 11.1 - 15.9 g/dL 11.3 11.7 11.5  Hematocrit 34.0 - 46.6 % 34.5 35.4 35.3  Platelets 150 - 450 x10E3/uL 255 238 224   Lab Results  Component Value Date   MCV 85 08/15/2020   MCV 85 02/05/2020   MCV 85 06/26/2019   Lab Results  Component Value Date   TSH 2.150 08/15/2020   Lab Results  Component Value Date   HGBA1C 8.4 (H) 08/15/2020     BNP No results found for: BNP  ProBNP No results found for: PROBNP   Lipid Panel     Component Value Date/Time  CHOL 165 08/15/2020 1147   CHOL 277 (H) 08/16/2014 0843   TRIG 100 08/15/2020 1147   TRIG 167 (H) 08/16/2014 0843   HDL 76 08/15/2020 1147   HDL 77 08/16/2014 0843   CHOLHDL 2.2 08/15/2020 1147   CHOLHDL 2.8 02/27/2019 0818   VLDL 22 02/27/2019 0818   VLDL 33 08/16/2014 0843   LDLCALC 71 08/15/2020 1147   LDLCALC 190 (H) 02/28/2017 1009   LDLCALC 167 (H) 08/16/2014 0843   LABVLDL 18 08/15/2020 1147     RADIOLOGY: No results found.   Additional studies/ records that were reviewed today include:  I have obtained a download from July 05, 2019 through October 02, 2019 when she was still using CPAP.  At 11 cm water pressure AHI was 2.4.  ASSESSMENT:    1. Essential hypertension   2. OSA (obstructive sleep apnea)   3.  Hyperlipidemia: Possible familial   4. Diabetes mellitus type 2 in obese (HCC)   5. Obesity, Class II, BMI 35-39.9     PLAN:  1.  Essential hypertension: At her October 2020 evaluation her beta-blocker was increased.  Current medications now include amlodipine 7.5 mg, furosemide 40 mg daily, metoprolol 100 mg twice a day.  She has not been using CPAP therapy which may be contributing to some of her mild systolic blood pressure elevation.  Diastolic blood pressures are normal.  2.  OSA: Apparently since her  last October 2020 evaluation she stopped using CPAP sometimes towards the end of 2021.  She states her machine began to make a little noise and she stopped using it.  Her sleep again is poor with frequent awakenings, daytime sleepiness, snoring, nocturia 3 times per night.  We discussed the reinitiation of therapy.  I will change her from a 11 cm set pressure to an auto pressure range of 8 to 14 cm.  She will need her machine to be reevaluated to find out the cause of the noise and if she is continuing to have issues and new machine may be necessary.  3.  Hyperlipidemia: I reviewed recent laboratory which was drawn by her primary physician.  This shows marked this suggesting of a familial hyperlipidemic pattern with total cholesterol in October 2021 increased to 310 and LDL cholesterol at 198.  I am recommending complete set of laboratory be obtained with a comprehensive metabolic panel, CBC, TSH, lipid studies as well as hemoglobin A1c.  If lipids remain elevated and she is taking rosuvastatin 20 mg and Zetia 10 mg, rosuvastatin should be increased to 40 mg and she should initiate PCSK9 inhibition.  She has been followed by her primary provider Vernie Murders, PA-C at [facility] family [facility].  4.  Type 2 diabetes mellitus: We will check a hemoglobin A1c.  In October 2021 this was significantly increased at 8.6.  5.  Obesity: BMI increased to 38.8.  Weight loss and exercise was recommended.  I will see her in 3 to 4 months for follow-up evaluation.  Medication Adjustments/Labs and Tests Ordered: Current medicines are reviewed at length with the patient today.  Concerns regarding medicines are outlined above.  Medication changes, Labs and Tests ordered today are listed in the Patient Instructions below. Patient Instructions  Medication Instructions:  Your physician recommends that you continue on your current medications as directed. Please refer to the Current Medication list given to you  today.  *If you need a refill on your cardiac medications before your next appointment, please call your pharmacy*   Lab  Work: CMET, CBC, TSH, Lipid, HmgA1C  If you have labs (blood work) drawn today and your tests are completely normal, you will receive your results only by: Marland Kitchen MyChart Message (if you have MyChart) OR . A paper copy in the mail If you have any lab test that is abnormal or we need to change your treatment, we will call you to review the results.  Follow-Up: At [facility], you and your health needs are our priority.  As part of our continuing mission to provide you with exceptional heart care, we have created designated Provider Care Teams.  These Care Teams include your primary Cardiologist (physician) and Advanced Practice Providers (APPs -  Physician Assistants and Nurse Practitioners) who all work together to provide you with the care you need, when you need it.  We recommend signing up for the patient portal called "MyChart".  Sign up information is provided on this After Visit Summary.  MyChart is used to connect with patients for Virtual Visits (Telemedicine).  Patients are able to view lab/test results, encounter notes, upcoming appointments, etc.  Non-urgent messages can be sent to your provider as well.   To learn more about what you can do with MyChart, go to [email or web address].    Your next appointment:   3-4 month(s)  The format for your next appointment:   In Person  Provider:   Shelva Majestic, MD   Other Instructions CPAP settings changed to Auto 8-16 cm H20    Signed, Shelva Majestic, MD  08/16/2020 3:01 PM    [facility] [address] [place], [place], [place]  [address] Phone: [phone]

## 2020-08-16 ENCOUNTER — Encounter: Payer: Self-pay | Admitting: Cardiovascular Disease

## 2020-08-16 LAB — COMPREHENSIVE METABOLIC PANEL
ALT: 26 IU/L (ref 0–32)
AST: 26 IU/L (ref 0–40)
Albumin/Globulin Ratio: 1.8 (ref 1.2–2.2)
Albumin: 4.8 g/dL (ref 3.8–4.9)
Alkaline Phosphatase: 63 IU/L (ref 44–121)
BUN/Creatinine Ratio: 22 (ref 9–23)
BUN: 34 mg/dL — ABNORMAL HIGH (ref 6–24)
Bilirubin Total: <0.2 mg/dL (ref 0.0–1.2)
CO2: 20 mmol/L (ref 20–29)
Calcium: 10.7 mg/dL — ABNORMAL HIGH (ref 8.7–10.2)
Chloride: 101 mmol/L (ref 96–106)
Creatinine, Ser: 1.55 mg/dL — ABNORMAL HIGH (ref 0.57–1.00)
Globulin, Total: 2.7 g/dL (ref 1.5–4.5)
Glucose: 96 mg/dL (ref 65–99)
Potassium: 4.8 mmol/L (ref 3.5–5.2)
Sodium: 142 mmol/L (ref 134–144)
Total Protein: 7.5 g/dL (ref 6.0–8.5)
eGFR: 38 mL/min/{1.73_m2} — ABNORMAL LOW (ref >59)

## 2020-08-16 LAB — LIPID PANEL
Chol/HDL Ratio: 2.2 ratio (ref 0.0–4.4)
Cholesterol, Total: 165 mg/dL (ref 100–199)
HDL: 76 mg/dL (ref >39)
LDL Chol Calc (NIH): 71 mg/dL (ref 0–99)
Triglycerides: 100 mg/dL (ref 0–149)
VLDL Cholesterol Cal: 18 mg/dL (ref 5–40)

## 2020-08-16 LAB — CBC
Hematocrit: 34.5 % (ref 34.0–46.6)
Hemoglobin: 11.3 g/dL (ref 11.1–15.9)
MCH: 27.9 pg (ref 26.6–33.0)
MCHC: 32.8 g/dL (ref 31.5–35.7)
MCV: 85 fL (ref 79–97)
Platelets: 255 10*3/uL (ref 150–450)
RBC: 4.05 x10E6/uL (ref 3.77–5.28)
RDW: 13.5 % (ref 11.7–15.4)
WBC: 6.3 10*3/uL (ref 3.4–10.8)

## 2020-08-16 LAB — HEMOGLOBIN A1C
Est. average glucose Bld gHb Est-mCnc: 194 mg/dL
Hgb A1c MFr Bld: 8.4 % — ABNORMAL HIGH (ref 4.8–5.6)

## 2020-08-16 LAB — TSH: TSH: 2.15 u[IU]/mL (ref 0.450–4.500)

## 2020-08-21 ENCOUNTER — Telehealth: Payer: Self-pay

## 2020-08-21 DIAGNOSIS — Z79899 Other long term (current) drug therapy: Secondary | ICD-10-CM

## 2020-08-21 NOTE — Telephone Encounter (Signed)
-----   Message from Troy Sine, MD sent at 08/20/2020 11:12 AM EDT ----- Compared to October 2021, glucose is now normal.  However creatinine is increased to 1.55.  Calciums mildly increased to 10.7. CBC stable.  Lipids are significantly improved with LDL cholesterol now at 71 compared to 198, and TC 310 - > 165.  If she is taking furosemide 40 mg, decrease to 20 mg daily.

## 2020-08-21 NOTE — Telephone Encounter (Signed)
Pt updated with lab results along with MD's recommendations to decrease lasix to 20 mg daily. However, pt is concerned about swelling and report she currently still have bilateral lower extremity edema.  Will forward to MD for recommendations.

## 2020-08-22 NOTE — Telephone Encounter (Signed)
Pt updated and verbalized understanding.  

## 2020-08-22 NOTE — Telephone Encounter (Signed)
If she is still having significant swelling she can continue the present dose of Lasix.  Strongly recommend support stockings as well.  Recommend follow-up be met in 2 weeks

## 2020-09-02 DIAGNOSIS — H18603 Keratoconus, unspecified, bilateral: Secondary | ICD-10-CM | POA: Diagnosis not present

## 2020-09-03 ENCOUNTER — Telehealth: Payer: Self-pay | Admitting: Family Medicine

## 2020-09-03 NOTE — Telephone Encounter (Addendum)
Pat NP with house calls is calling to let dennis know the patient PHQ score for depression is 10 and she has referred the patient to behavior health through [facility]. Pt phone number is [phone]

## 2020-09-04 DIAGNOSIS — M791 Myalgia, unspecified site: Secondary | ICD-10-CM | POA: Diagnosis not present

## 2020-09-04 DIAGNOSIS — G43719 Chronic migraine without aura, intractable, without status migrainosus: Secondary | ICD-10-CM | POA: Diagnosis not present

## 2020-09-04 DIAGNOSIS — M542 Cervicalgia: Secondary | ICD-10-CM | POA: Diagnosis not present

## 2020-09-04 NOTE — Telephone Encounter (Signed)
Agree with referral

## 2020-09-05 ENCOUNTER — Other Ambulatory Visit: Payer: Self-pay | Admitting: Cardiovascular Disease

## 2020-09-08 ENCOUNTER — Telehealth: Payer: Medicare Other

## 2020-09-08 ENCOUNTER — Telehealth: Payer: Self-pay

## 2020-09-08 NOTE — Telephone Encounter (Signed)
  Chronic Care Management   Outreach Note  09/08/2020 Name: SEFORA TIETJE MRN: [id number] DOB: 1961/01/08  Primary Care Provider: Margo Common, PA-C Reason for referral : Chronic Care Management   An unsuccessful telephone outreach was attempted today. Mrs. Shor is currently enrolled in the Chronic Care Management program.     Follow Up Plan:  A HIPAA compliant voice message was left today requesting a return call.     [name] [phone]

## 2020-09-17 ENCOUNTER — Other Ambulatory Visit: Payer: Self-pay | Admitting: Allergy & Immunology

## 2020-09-17 ENCOUNTER — Other Ambulatory Visit: Payer: Self-pay | Admitting: Cardiovascular Disease

## 2020-09-22 ENCOUNTER — Other Ambulatory Visit: Payer: Self-pay | Admitting: Family Medicine

## 2020-09-22 NOTE — Telephone Encounter (Signed)
Requested medication (s) are due for refill today: Early  Requested medication (s) are on the active medication list: Yes  Last refill:  07/21/20  Future visit scheduled: No  Notes to clinic:  See request.    Requested Prescriptions  Pending Prescriptions Disp Refills   TEGRETOL-XR 400 MG 12 hr tablet [Pharmacy Med Name: TEGRETOL XR 400 MG TABLET] 180 tablet 0    Sig: TAKE 1 TABLET BY MOUTH TWICE A DAY      Not Delegated - Neurology:  Anticonvulsants - carbamazepine Failed - 09/22/2020 11:36 AM      Failed - This refill cannot be delegated      Failed - Carbamazepine (serum) in normal range and within 360 days    Carbamazepine, Total  Date Value Ref Range Status  08/09/2013 4.9 4.0 - 12.0 mcg/mL Final   Carbamazepine Lvl  Date Value Ref Range Status  01/23/2016 4.4 4.0 - 12.0 ug/mL Final          Passed - AST in normal range and within 90 days    AST  Date Value Ref Range Status  08/15/2020 26 0 - 40 IU/L Final   SGOT(AST)  Date Value Ref Range Status  08/16/2014 54 (H) U/L Final    Comment:    15-41 NOTE: New Reference Range  07/09/14           Passed - ALT in normal range and within 90 days    ALT  Date Value Ref Range Status  08/15/2020 26 0 - 32 IU/L Final   SGPT (ALT)  Date Value Ref Range Status  08/16/2014 49 U/L Final    Comment:    14-54 NOTE: New Reference Range  07/09/14           Passed - WBC in normal range and within 90 days    WBC  Date Value Ref Range Status  08/15/2020 6.3 3.4 - 10.8 x10E3/uL Final  02/27/2019 5.3 4.0 - 10.5 K/uL Final          Passed - PLT in normal range and within 90 days    Platelets  Date Value Ref Range Status  08/15/2020 255 150 - 450 x10E3/uL Final          Passed - HGB in normal range and within 90 days    Hemoglobin  Date Value Ref Range Status  08/15/2020 11.3 11.1 - 15.9 g/dL Final          Passed - Na in normal range and within 90 days    Sodium  Date Value Ref Range Status   08/15/2020 142 134 - 144 mmol/L Final  08/16/2014 144 mmol/L Final    Comment:    135-145 NOTE: New Reference Range  07/09/14           Passed - HCT in normal range and within 90 days    Hematocrit  Date Value Ref Range Status  08/15/2020 34.5 34.0 - 46.6 % Final          Passed - Valid encounter within last 12 months    Recent Outpatient Visits           4 months ago Viral URI with cough   [place], Vickki Muff, PA-C   5 months ago Suspected COVID-19 virus infection   [facility], Vickki Muff, PA-C   6 months ago Anxiety, generalized   [employer] [facility], Vickki Muff, PA-C   7 months ago Chronic pain of  both knees   [place], Vickki Muff, PA-C   9 months ago Herpes zoster without complication   [employer] [place], Kelby Aline, FNP       Future Appointments             In 2 weeks Ernst Bowler Gwenith Daily, MD [facility] and [place]   In 2 months Troy Sine, MD [facility] [place], [place]   In 8 months Ernst Bowler Gwenith Daily, MD Allergy and [place]

## 2020-09-23 ENCOUNTER — Other Ambulatory Visit: Payer: Self-pay | Admitting: Family Medicine

## 2020-09-23 DIAGNOSIS — F331 Major depressive disorder, recurrent, moderate: Secondary | ICD-10-CM

## 2020-09-23 NOTE — Telephone Encounter (Signed)
Patient called in to inform Vernie Murders that she is completely out of the TEGRETOL-XR 400 MG 12 hr tablet  Please advise

## 2020-09-25 ENCOUNTER — Ambulatory Visit: Payer: Self-pay

## 2020-09-25 DIAGNOSIS — I509 Heart failure, unspecified: Secondary | ICD-10-CM

## 2020-09-25 DIAGNOSIS — E119 Type 2 diabetes mellitus without complications: Secondary | ICD-10-CM

## 2020-09-25 NOTE — Chronic Care Management (AMB) (Signed)
  Care Management   Follow Up Note   09/25/2020 Name: Abigail Figueroa MRN: [id number] DOB: 12/28/1960   Primary Care Provider: Margo Common, PA-C Reason for referral : Chronic Care Management   [name] was referred to the care management team for assistance with chronic care management and care coordination. Her primary care provider will be notified of our unsuccessful attempts to maintain contact. The care management team will gladly outreach at any time in the future if she is interested in receiving assistance.    Follow Up Plan:  The care management team will gladly follow up with [name] after the primary care provider has a conversation with her regarding recommendation for care management engagement and subsequent re-referral for care management services.      [name] Care Management [facility] [phone]

## 2020-09-30 DIAGNOSIS — G4733 Obstructive sleep apnea (adult) (pediatric): Secondary | ICD-10-CM | POA: Diagnosis not present

## 2020-10-07 ENCOUNTER — Ambulatory Visit: Payer: Self-pay | Admitting: Allergy & Immunology

## 2020-10-23 DIAGNOSIS — G43719 Chronic migraine without aura, intractable, without status migrainosus: Secondary | ICD-10-CM | POA: Diagnosis not present

## 2020-10-23 DIAGNOSIS — M542 Cervicalgia: Secondary | ICD-10-CM | POA: Diagnosis not present

## 2020-10-23 DIAGNOSIS — M791 Myalgia, unspecified site: Secondary | ICD-10-CM | POA: Diagnosis not present

## 2020-12-05 ENCOUNTER — Ambulatory Visit: Payer: Medicare Other | Admitting: Cardiovascular Disease

## 2020-12-10 DIAGNOSIS — G43719 Chronic migraine without aura, intractable, without status migrainosus: Secondary | ICD-10-CM | POA: Diagnosis not present

## 2020-12-10 DIAGNOSIS — M542 Cervicalgia: Secondary | ICD-10-CM | POA: Diagnosis not present

## 2020-12-10 DIAGNOSIS — M791 Myalgia, unspecified site: Secondary | ICD-10-CM | POA: Diagnosis not present

## 2020-12-16 ENCOUNTER — Ambulatory Visit: Payer: Self-pay | Admitting: Allergy & Immunology

## 2020-12-25 ENCOUNTER — Other Ambulatory Visit: Payer: Self-pay | Admitting: Family Medicine

## 2020-12-28 ENCOUNTER — Other Ambulatory Visit: Payer: Self-pay | Admitting: Family Medicine

## 2020-12-28 DIAGNOSIS — F331 Major depressive disorder, recurrent, moderate: Secondary | ICD-10-CM

## 2020-12-28 NOTE — Telephone Encounter (Signed)
Requested Prescriptions  Pending Prescriptions Disp Refills  . VIIBRYD 40 MG TABS [Pharmacy Med Name: VIIBRYD 40 MG TABLET] 20 tablet 0    Sig: TAKE 1 TABLET BY MOUTH EVERY DAY     Psychiatry:  Antidepressants - SSRI Failed - 12/28/2020 10:38 AM      Failed - Valid encounter within last 6 months    Recent Outpatient Visits          7 months ago Viral URI with cough   [place], PA-C   8 months ago Suspected COVID-19 virus infection   [employer] [facility], Vickki Muff, PA-C   10 months ago Anxiety, generalized   [place], PA-C   10 months ago Chronic pain of both knees   [place], PA-C   1 year ago Herpes zoster without complication   [facility], Kelby Aline, FNP      Future Appointments            In 1 week Cleaver, Jossie Ng, NP [facility], [place]   In 2 weeks Chrism[place], Vickki Muff, PA-C [employer], [place]   In 4 months Ernst Bowler, Gwenith Daily, MD Allergy and [place] - Completed PHQ-2 or PHQ-9 in the last 360 days

## 2021-01-08 ENCOUNTER — Other Ambulatory Visit: Payer: Self-pay | Admitting: Family Medicine

## 2021-01-08 NOTE — Progress Notes (Signed)
Cardiology Clinic Note   Patient Name: Abigail Figueroa Date of Encounter: 01/09/2021  Primary Care Provider:  Margo Common, PA-C Primary Cardiologist:  Shelva Majestic, MD  Patient Profile     Abigail Figueroa 60 year old female presents the clinic today for follow-up evaluation of her hypertension and palpitations.  Past Medical History    Past Medical History:  Diagnosis Date   Abnormal stress test    a. 10/2009: Normal myocardial perfusion imaging; b. 08/2015 MV: medium defect of mod severity in mid ant apical region w/ mild HK of the distal inf wall;  c. 08/2015 Cath: nl Cors, EF 60%.   Anemia    Angio-edema    Anxiety    Arthritis    Bell's palsy    Depression    Edema    Essential hypertension    Frequent urination    Frequent urination at night    Headache    migraines, gets botox injections   Heart palpitations June 2013   Event monitor showing sinus tachycardia, PACs with couplets and triplets.   Hx of echocardiogram    a. 10/2009 Echo: showed normal left ventricular function, mild left ventricular hypertrophy, and no significant valve abnormalities.   Hypercholesterolemia    Migraine    Morbid obesity ([place])    Neuromuscular disorder (HCC)    OSA (obstructive sleep apnea)    has been on continuous positive airway pressure   Seizure disorder ([place])    Seizures ([place])    Type II diabetes mellitus ([place])    Urticaria    Past Surgical History:  Procedure Laterality Date   ABDOMINAL HYSTERECTOMY  1990   without BSO   BACK SURGERY  1900s 2001   x2 with "cage put in"   [place] N/A 08/06/2015   Procedure: Left Heart Cath and Coronary Angiography;  Surgeon: Troy Sine, MD;  Location: [facility];  Service: Cardiovascular;  Laterality: N/A;   CARPAL TUNNEL RELEASE Bilateral    CHOLECYSTECTOMY  1980   COLONOSCOPY WITH PROPOFOL N/A 08/30/2017   Procedure: COLONOSCOPY WITH PROPOFOL;  Surgeon: Lin Landsman, MD;  Location: [facility];  Service: Endoscopy;  Laterality: N/A;   DG THUMB LEFT HAND  01/24/2018   repair joint in left thumb   DORSAL COMPARTMENT RELEASE Left 10/25/2014   Procedure: LEFT FIRST  DORSAL COMPARTMENT RELEASE AND RADIAL TENOSYNOVECTOMY ;  Surgeon: Roseanne Kaufman, MD;  Location: [facility];  Service: Orthopedics;  Laterality: Left;   HAND SURGERY     KNEE SURGERY Right 2012   meniscus tear   KNEE SURGERY  2012   LAMINECTOMY  1995   TUBAL LIGATION  1982   WRIST SURGERY Right     Allergies  Allergies  Allergen Reactions   Morphine Rash and Swelling   Morphine And Related Hives    Nausea and vomiting  Nausea and vomiting   Oxycodone Hives, Itching, Rash and Swelling   Oxycodone Hcl Swelling, Hives and Itching   Aimovig [Erenumab-Aooe]     Questionable lip swelling angoiedema   Morphine Sulfate Itching and Nausea And Vomiting    REACTION: swelling    History of Present Illness     Abigail Figueroa has a PMH of hypertension, palpitations, peripheral edema, hyperlipidemia, type 2 diabetes, depression, and migraines.  Her PMH also includes OSA-on CPAP.  She was seen and evaluated by Dr. Claiborne Billings via telemedicine visit 5/20 and again 10/20.  She  was noted to have elevated blood pressure and it was recommended that her metoprolol be increased to 100 mg twice daily.  Her CPAP download showed an AHI of 1.6.  She was using her device only 4 hours and 40 minutes.  Her LDL cholesterol was 105 with a atorvastatin therapy.  Ezetimibe was added to her medication regimen.  She was seen by Dr. Claiborne Billings on 08/15/2020.  During that time she reported that she has stopped using her CPAP device.  Her download from March through June showed a AHI of 2.4.  She reported that her machine had began to make loud noises and she stopped using it.  She reported that she was not sleeping well.  She was going to bed between 10 PM and midnight.  She reported nocturia 3 times  per night and would wake up between 6 and 8 AM.  She reported daytime sleepiness.  She presents the clinic today for follow-up evaluation states she feels well.  Her blood pressure today is 122/74.  She does report some episodes of left arm and chest discomfort.  She notices that the discomfort will come on while she is at rest and dissipate without intervention.  She describes her episodes as brief.  She denies chest discomfort with increased physical activity.  She reports that she has not been using her CPAP regularly.  Her main hurdle is daily cleaning of her face mask and tubing.  She reports that she is tired in the evening and knows that she needs to keep her device clean.  We reviewed the pathophysiology of obstructive sleep apnea as well as risks.  She reports that she will start using her device more regularly.  Her device has been fixed and is no longer making the mild noise.  She is eating a vegan diet and staying away from salt.  I will order a BMP, give her the [place] support stocking sheet, have her increase her physical activity as tolerated, and have her follow-up in 6 months.  Today she denies chest pain, shortness of breath, lower extremity edema, fatigue, palpitations, melena, hematuria, hemoptysis, diaphoresis, weakness, presyncope, syncope, orthopnea, and PND.   Home Medications    Prior to Admission medications   Medication Sig Start Date End Date Taking? Authorizing Provider  amLODipine (NORVASC) 5 MG tablet TAKE 1.5 TABLETS BY MOUTH DAILY. 09/18/20   Troy Sine, MD  Azelastine HCl 0.15 % SOLN Place 2 sprays into both nostrils 2 (two) times daily. 07/04/20 08/03/20  Valentina Shaggy, MD  baclofen (LIORESAL) 10 MG tablet Take 10 mg by mouth 2 (two) times daily as needed for muscle spasms.     [provider]  benzonatate (TESSALON) 200 MG capsule Take 1 capsule (200 mg total) by mouth 2 (two) times daily as needed for cough. 04/24/20   [place], Vickki Muff, PA-C   BOTOX 100 UNITS SOLR injection Inject into the muscle every 3 (three) months.  11/01/12   [provider]  busPIRone (BUSPAR) 10 MG tablet TAKE 1 TABLET (10 MG TOTAL) BY MOUTH 3 (THREE) TIMES DAILY. (AS NEEDED FOR ANXIETY) 08/06/20   Chrismon, Vickki Muff, PA-C  clindamycin (CLEOCIN T) 1 % lotion APPLY TOPICALLY ONCE A DAY TO AREAS OF ACNE ON THE FACE 02/04/20   [provider]  doxycycline (VIBRA-TABS) 100 MG tablet Take 1 tablet (100 mg total) by mouth 2 (two) times daily. 04/21/20   Chrismon, Dennis E, PA-C  EMGALITY 120 MG/ML SOAJ Inject into the skin as  needed. 02/18/20   [provider]  EPINEPHrine 0.3 mg/0.3 mL IJ SOAJ injection Inject 0.3 mLs (0.3 mg total) into the muscle as needed for up to 1 dose for anaphylaxis (Call 911 if used.). 08/30/19   Flinchum, Kelby Aline, FNP  ezetimibe (ZETIA) 10 MG tablet TAKE 1 TABLET (10 MG TOTAL) BY MOUTH DAILY. PLEASE SCHEDULE APPOINTMENT WITH DR. Claiborne Billings FOR REFILLS. 07/08/20   Troy Sine, MD  fluocinonide ointment (LIDEX) 1.96 % Apply 1 application topically 2 (two) times daily. 06/26/19   Chrismon, Vickki Muff, PA-C  furosemide (LASIX) 20 MG tablet Take 1 tablet (20 mg total) by mouth daily. Patient taking differently: Take 20 mg by mouth 2 (two) times daily. 07/08/20   Troy Sine, MD  glucose blood [facility] VERIO) test strip TEST FASTING GLUCOSE DAILY AS DIRECTED 05/15/20   Chrismon, Vickki Muff, PA-C  hydrocortisone 1 % ointment Apply 1 application topically 2 (two) times daily. To areas of itching not to use if any blisters or open wounds. USE PRN itching. 12/03/19   Flinchum, Kelby Aline, FNP  lamoTRIgine (LAMICTAL) 100 MG tablet Take 100 mg by mouth 2 (two) times daily.    [provider]  Lancets (ONETOUCH ULTRASOFT) lancets Test fasting each morning and 2 hours before supper. Retest if having hypoglycemic symptoms. 05/20/20   Chrismon, Vickki Muff, PA-C  levocetirizine (XYZAL) 5 MG tablet TAKE 1 TABLET BY MOUTH EVERY DAY AS  NEEDED 09/17/20   Valentina Shaggy, MD  metFORMIN (GLUCOPHAGE) 500 MG tablet TAKE 2 TABLETS BY MOUTH IN THE MORNING ,1 TABLET AT LUNCH, & TAKE 2 TABLETS IN THE EVENING 01/08/21   Chrismon, Vickki Muff, PA-C  metoprolol tartrate (LOPRESSOR) 100 MG tablet TAKE 1 TABLET (100 MG TOTAL) BY MOUTH 2 (TWO) TIMES DAILY. KEEP OFFICE VISIT 09/05/20   Troy Sine, MD  promethazine (PHENERGAN) 25 MG tablet Take 25 mg by mouth every 4 (four) hours as needed.  02/18/20   [provider]  rosuvastatin (CRESTOR) 20 MG tablet Take 1 tablet (20 mg total) by mouth daily. 02/11/20   Chrismon, Vickki Muff, PA-C  TEGRETOL-XR 400 MG 12 hr tablet TAKE 1 TABLET BY MOUTH TWICE A DAY 12/25/20   Chrismon, Dennis E, PA-C  VIIBRYD 40 MG TABS TAKE 1 TABLET BY MOUTH EVERY DAY 12/28/20   Chrismon, Vickki Muff, PA-C    Family History    Family History  Problem Relation Age of Onset   Sarcoidosis Mother    Seizures Mother    Heart attack Mother    Alzheimer's disease Maternal Grandmother    Dementia Maternal Grandmother    Hypertension Maternal Grandfather    Diabetes Maternal Grandfather    Breast cancer Neg Hx    Allergic rhinitis Neg Hx    Asthma Neg Hx    Eczema Neg Hx    Urticaria Neg Hx    She indicated that her mother is deceased. She indicated that her father is deceased. She indicated that two of her three brothers are alive. She indicated that her maternal grandmother is deceased. She indicated that her maternal grandfather is alive. She indicated that her paternal grandmother is deceased. She indicated that her paternal grandfather is deceased. She indicated that her daughter is alive. She indicated that the status of her neg hx is unknown.  Social History    Social History   Socioeconomic History   Marital status: Married    Spouse name: Not on file   Number of children: 2  Years of education: Not on file   Highest education level: 12th grade  Occupational History   Occupation: disabled  Tobacco  Use   Smoking status: Former    Types: Cigarettes    Quit date: 01/03/1983    Years since quitting: 38.0   Smokeless tobacco: Never   Tobacco comments:    quit in 1984  Vaping Use   Vaping Use: Never used  Substance and Sexual Activity   Alcohol use: No    Alcohol/week: 0.0 standard drinks   Drug use: No   Sexual activity: Never  Other Topics Concern   Not on file  Social History Narrative   ** Merged History Encounter **       She is a married mother of 2, grandmother 3. She tries to get exercise but is not doing any routine program.   She quit smoking over 25 years ago does not drink alcohol.   Social Determinants of Health   Financial Resource Strain: Medium Risk   Difficulty of Paying Living Expenses: Somewhat hard  Food Insecurity: No Food Insecurity   Worried About Charity fundraiser in the Last Year: Never true   Ran Out of Food in the Last Year: Never true  Transportation Needs: No Transportation Needs   Lack of Transportation (Medical): No   Lack of Transportation (Non-Medical): No  Physical Activity: Inactive   Days of Exercise per Week: 0 days   Minutes of Exercise per Session: 0 min  Stress: Stress Concern Present   Feeling of Stress : To some extent  Social Connections: Moderately Integrated   Frequency of Communication with Friends and Family: More than three times a week   Frequency of Social Gatherings with Friends and Family: More than three times a week   Attends Religious Services: Never   Marine scientist or Organizations: Yes   Attends Music therapist: More than 4 times per year   Marital Status: Married  Human resources officer Violence: Not At Risk   Fear of Current or Ex-Partner: No   Emotionally Abused: No   Physically Abused: No   Sexually Abused: No     Review of Systems    General:  No chills, fever, night sweats or weight changes.  Cardiovascular:  No chest pain, dyspnea on exertion, edema, orthopnea, palpitations,  paroxysmal nocturnal dyspnea. Dermatological: No rash, lesions/masses Respiratory: No cough, dyspnea Urologic: No hematuria, dysuria Abdominal:   No nausea, vomiting, diarrhea, bright red blood per rectum, melena, or hematemesis Neurologic:  No visual changes, wkns, changes in mental status. All other systems reviewed and are otherwise negative except as noted above.  Physical Exam    VS:  BP 122/74 (BP Location: Right Arm, Patient Position: Sitting, Cuff Size: Large)   Pulse 66   Ht 5\' 3"  (1.6 m)   Wt 210 lb 9.6 oz (95.5 kg)   LMP  (LMP Unknown)   SpO2 96%   BMI 37.31 kg/m  , BMI Body mass index is 37.31 kg/m. GEN: Well nourished, well developed, in no acute distress. HEENT: normal. Neck: Supple, no JVD, carotid bruits, or masses. Cardiac: RRR, no murmurs, rubs, or gallops. No clubbing, cyanosis, bilateral ankle edema.  Radials/DP/PT 2+ and equal bilaterally.  Respiratory:  Respirations regular and unlabored, clear to auscultation bilaterally. GI: Soft, nontender, nondistended, BS + x 4. MS: no deformity or atrophy. Skin: warm and dry, no rash. Neuro:  Strength and sensation are intact. Psych: Normal affect.  Accessory Clinical Findings  Recent Labs: 08/15/2020: ALT 26; BUN 34; Creatinine, Ser 1.55; Hemoglobin 11.3; Platelets 255; Potassium 4.8; Sodium 142; TSH 2.150   Recent Lipid Panel    Component Value Date/Time   CHOL 165 08/15/2020 1147   CHOL 277 (H) 08/16/2014 0843   TRIG 100 08/15/2020 1147   TRIG 167 (H) 08/16/2014 0843   HDL 76 08/15/2020 1147   HDL 77 08/16/2014 0843   CHOLHDL 2.2 08/15/2020 1147   CHOLHDL 2.8 02/27/2019 0818   VLDL 22 02/27/2019 0818   VLDL 33 08/16/2014 0843   LDLCALC 71 08/15/2020 1147   LDLCALC 190 (H) 02/28/2017 1009   LDLCALC 167 (H) 08/16/2014 0843    ECG personally reviewed by me today- none today.  Nuclear stress test 07/25/2015 Nuclear stress EF: 56%. Mild hypokinesis in the distal inferior wall segment. T wave inversion  was noted during stress in the II, III, aVF, V5 and V6 leads. Defect 1: There is a medium defect of moderate severity present in the mid anterior, apical anterior and apex location. Findings consistent with ischemia. Note, sensitivity and specificity reduced by body habitus, 2 day study. This is an intermediate risk study.   Candee Furbish, MD   Cardiac catheterization 08/06/2015 The left ventricular systolic function is normal.   Normal coronary arteries. Normal LV function with an ejection fraction of at least 60%.   RECOMMENDATION: The patient's stress test is a false positive study, most likely due to her morbid obesity and body habitus.  She has normal coronary arteries and normal LV function.  She is given preoperative clearance for her planned lumbar surgery to be done by Dr. Arnoldo Morale.   Assessment & Plan   1.  Essential hypertension-BP today 122/74.  Blood pressure at home 120's/70's Continue metoprolol, amlodipine, furosemide Heart healthy low-sodium diet-salty 6 given Increase physical activity as tolerated  OSA-reports noncompliance with CPAP.  Was able to have her machine evaluated and fixed.  Notes that she does wake up rested when she uses her machine.  We reviewed the risks of OSA and I encouraged use. Continue CPAP use  Hyperlipidemia-08/15/2020: Cholesterol, Total 165; HDL 76; LDL Chol Calc (NIH) 71; Triglycerides 100 Continue rosuvastatin, ezetimibe Heart healthy low-sodium high-fiber diet.   Increase physical activity as tolerated  Obesity-weight today 210 pounds.  Continues to increase her physical activity and monitor her diet. Continue weight loss  Type 2 diabetes-glucose 96 on 08/15/2020 Continue heart healthy low-sodium carb modified diet Continue metformin  Disposition: Follow-up with Dr. Claiborne Billings in 6 months.  Jossie Ng. Javonte Elenes NP-C    01/09/2021, 12:02 PM [address] Office [phone] Fax [phone]  Notice: This dictation was prepared with Dragon dictation along with smaller phrase technology. Any transcriptional errors that result from this process are unintentional and may not be corrected upon review.  I spent 14 minutes examining this patient, reviewing medications, and using patient centered shared decision making involving her cardiac care.  Prior to her visit I spent greater than 20 minutes reviewing her past medical history,  medications, and prior cardiac tests.

## 2021-01-08 NOTE — Telephone Encounter (Signed)
Requested Prescriptions  Pending Prescriptions Disp Refills  . metFORMIN (GLUCOPHAGE) 500 MG tablet [Pharmacy Med Name: METFORMIN HCL 500 MG TABLET] 450 tablet 0    Sig: TAKE 2 TABLETS BY MOUTH IN THE MORNING ,1 TABLET AT LUNCH, & TAKE 2 TABLETS IN THE EVENING     Endocrinology:  Diabetes - Biguanides Failed - 01/08/2021  2:45 AM      Failed - Cr in normal range and within 360 days    Creat  Date Value Ref Range Status  02/28/2017 1.03 0.50 - 1.05 mg/dL Final    Comment:    For patients >29 years of age, the reference limit for Creatinine is approximately 13% higher for people identified as African-American. .    Creatinine, Ser  Date Value Ref Range Status  08/15/2020 1.55 (H) 0.57 - 1.00 mg/dL Final   Creatinine, POC  Date Value Ref Range Status  07/20/2016 NA mg/dL Final         Failed - HBA1C is between 0 and 7.9 and within 180 days    Hemoglobin A1C  Date Value Ref Range Status  08/16/2014 9.4 (H) % Final    Comment:    4.0-6.0 NOTE: New Reference Range  07/09/14    Hgb A1c MFr Bld  Date Value Ref Range Status  08/15/2020 8.4 (H) 4.8 - 5.6 % Final    Comment:             Prediabetes: 5.7 - 6.4          Diabetes: >6.4          Glycemic control for adults with diabetes: <7.0          Failed - Valid encounter within last 6 months    Recent Outpatient Visits          7 months ago Viral URI with cough   [place], Vickki Muff, PA-C   8 months ago Suspected COVID-19 virus infection   [facility], Vickki Muff, PA-C   10 months ago Anxiety, generalized   [place], PA-C   11 months ago Chronic pain of both knees   [facility], Vickki Muff, PA-C   1 year ago Herpes zoster without complication   [place], Kelby Aline, FNP      Future Appointments            Tomorrow Cleaver, Jossie Ng, NP [facility], [place]   In 1 week  [place], Vickki Muff, [place], [place]   In 4 months Ernst Bowler, Gwenith Daily, MD Allergy and [place] - AA eGFR in normal range and within 360 days    GFR, Est African American  Date Value Ref Range Status  02/28/2017 70 > OR = 60 mL/min/1.62m Final   GFR calc Af Amer  Date Value Ref Range Status  02/05/2020 66 >59 mL/min/1.73 Final    Comment:    **[facility] currently reports eGFR in compliance with the current**   recommendations of the [employer] [facility] will   update reporting as new guidelines are published from the NKF-ASN   Task force.    GFR, Est Non African American  Date Value Ref Range Status  02/28/2017 61 > OR = 60 mL/min/1.720mFinal   GFR calc non Af Amer  Date Value Ref Range Status  02/05/2020 58 (L) >59 mL/min/1.73 Final  eGFR  Date Value Ref Range Status  08/15/2020 38 (L) >59 mL/min/1.73 Final

## 2021-01-09 ENCOUNTER — Other Ambulatory Visit: Payer: Self-pay

## 2021-01-09 ENCOUNTER — Ambulatory Visit (INDEPENDENT_AMBULATORY_CARE_PROVIDER_SITE_OTHER): Payer: Medicare Other | Admitting: General Practice

## 2021-01-09 ENCOUNTER — Encounter: Payer: Self-pay | Admitting: General Practice

## 2021-01-09 VITALS — BP 122/74 | HR 66 | Ht 63.0 in | Wt 210.6 lb

## 2021-01-09 DIAGNOSIS — I10 Essential (primary) hypertension: Secondary | ICD-10-CM

## 2021-01-09 DIAGNOSIS — E1169 Type 2 diabetes mellitus with other specified complication: Secondary | ICD-10-CM

## 2021-01-09 DIAGNOSIS — G4733 Obstructive sleep apnea (adult) (pediatric): Secondary | ICD-10-CM | POA: Diagnosis not present

## 2021-01-09 DIAGNOSIS — E782 Mixed hyperlipidemia: Secondary | ICD-10-CM | POA: Diagnosis not present

## 2021-01-09 DIAGNOSIS — E669 Obesity, unspecified: Secondary | ICD-10-CM

## 2021-01-09 DIAGNOSIS — Z79899 Other long term (current) drug therapy: Secondary | ICD-10-CM | POA: Diagnosis not present

## 2021-01-09 DIAGNOSIS — Z6837 Body mass index (BMI) 37.0-37.9, adult: Secondary | ICD-10-CM

## 2021-01-09 NOTE — Patient Instructions (Signed)
Medication Instructions:  The current medical regimen is effective;  continue present plan and medications as directed. Please refer to the Current Medication list given to you today.  *If you need a refill on your cardiac medications before your next appointment, please call your pharmacy*  Lab Work: BMET TODAY If you have labs (blood work) drawn today and your tests are completely normal, you will receive your results only by:  [facility] (if you have MyChart) OR A paper copy in the mail.  If you have any lab test that is abnormal or we need to change your treatment, we will call you to review the results. You may go to any [facility] that is convenient for you however, we do have a lab in our office that is able to assist you. You DO NOT need an appointment for our lab. The lab is open 8:00am and closes at 4:00pm. Lunch 12:45 - 1:45pm.  Special Instructions CONTINUE DIET  PLEASE INCREASE PHYSICAL ACTIVITY AS TOLERATED  PLEASE PURCHASE AND WEAR COMPRESSION STOCKINGS DAILY AND TAKE OFF AT BEDTIME. Compression stockings are elastic socks that squeeze the legs. They help to increase blood flow to the legs and to decrease swelling in the legs from fluid retention, and reduce the chance of developing blood clots in the lower legs. Please put on in the AM when dressing and off at night when dressing for bed.  LET THEM KNOW THAT YOU NEED KNEE HIGH'S WITH COMPRESSION OF 15-20 mmhg.  [employer]  [place] ([phone]); [place], [place] [address]; [phone]  EMAIL   [email or web address].  PLEASE MAKE SURE TO ELEVATE YOUR FEET & LEGS WHILE SITTING, THIS WILL HELP WITH THE SWELLING ALSO.   Follow-Up: Your next appointment:  6 month(s) In Person with Shelva Majestic, MD   At [facility], you and your health needs are our priority.  As part of our continuing mission to provide you with exceptional heart care, we have created designated Provider Care Teams.  These Care Teams  include your primary Cardiologist (physician) and Advanced Practice Providers (APPs -  Physician Assistants and Nurse Practitioners) who all work together to provide you with the care you need, when you need it.

## 2021-01-10 LAB — BASIC METABOLIC PANEL
BUN/Creatinine Ratio: 16 (ref 12–28)
BUN: 20 mg/dL (ref 8–27)
CO2: 25 mmol/L (ref 20–29)
Calcium: 10.3 mg/dL (ref 8.7–10.3)
Chloride: 102 mmol/L (ref 96–106)
Creatinine, Ser: 1.26 mg/dL — ABNORMAL HIGH (ref 0.57–1.00)
Glucose: 151 mg/dL — ABNORMAL HIGH (ref 65–99)
Potassium: 5.2 mmol/L (ref 3.5–5.2)
Sodium: 141 mmol/L (ref 134–144)
eGFR: 49 mL/min/{1.73_m2} — ABNORMAL LOW (ref >59)

## 2021-01-16 ENCOUNTER — Ambulatory Visit (INDEPENDENT_AMBULATORY_CARE_PROVIDER_SITE_OTHER): Payer: Medicare Other | Admitting: Family Medicine

## 2021-01-16 ENCOUNTER — Ambulatory Visit: Payer: Medicare Other | Admitting: Family Medicine

## 2021-01-16 ENCOUNTER — Encounter: Payer: Self-pay | Admitting: Family Medicine

## 2021-01-16 ENCOUNTER — Other Ambulatory Visit: Payer: Self-pay

## 2021-01-16 VITALS — BP 132/68 | HR 68 | Ht 63.0 in | Wt 209.0 lb

## 2021-01-16 DIAGNOSIS — L299 Pruritus, unspecified: Secondary | ICD-10-CM | POA: Diagnosis not present

## 2021-01-16 DIAGNOSIS — J3089 Other allergic rhinitis: Secondary | ICD-10-CM

## 2021-01-16 DIAGNOSIS — J302 Other seasonal allergic rhinitis: Secondary | ICD-10-CM | POA: Diagnosis not present

## 2021-01-16 DIAGNOSIS — F331 Major depressive disorder, recurrent, moderate: Secondary | ICD-10-CM

## 2021-01-16 MED ORDER — FLUTICASONE PROPIONATE 50 MCG/ACT NA SUSP: 2.0000 | Freq: Two times a day (BID) | NASAL | 11 refills | Status: DC

## 2021-01-16 MED ORDER — AZELASTINE HCL 0.15 % NA SOLN: 2.0000 | Freq: Two times a day (BID) | NASAL | 4 refills | Status: DC

## 2021-01-16 MED ORDER — LEVOCETIRIZINE DIHYDROCHLORIDE 5 MG PO TABS: ORAL_TABLET | ORAL | 3 refills | Status: DC

## 2021-01-16 MED ORDER — VILAZODONE HCL 40 MG PO TABS: 40.0000 mg | ORAL_TABLET | Freq: Every day | ORAL | 3 refills | Status: DC

## 2021-01-16 MED ORDER — BUSPIRONE HCL 15 MG PO TABS: 15.0000 mg | ORAL_TABLET | Freq: Two times a day (BID) | ORAL | 3 refills | Status: DC

## 2021-01-16 MED ORDER — HYDROCORTISONE 1 % EX OINT: 1.0000 "application " | TOPICAL_OINTMENT | Freq: Two times a day (BID) | CUTANEOUS | 2 refills | Status: DC

## 2021-01-16 NOTE — Assessment & Plan Note (Signed)
Acute concern; various skin locations on BUE

## 2021-01-16 NOTE — Progress Notes (Signed)
Established patient visit   Patient: Abigail Figueroa   DOB: 1961-04-03   60 y.o. Female  MRN: [id number] Visit Date: 01/16/2021  Today's healthcare provider: Gwyneth Sprout, FNP   Medication refill need-  Subjective    HPI   Patient reports feeling okay, sleeping okay, eating healthy. Has been feeling depressed lately and considering going back to see Otila Kluver for counseling. Patient would like refills on her hydrocortisone cream and Viibyrd. Patiently currently sick believes it is her sinuses. Took covid test this morning and was negative. ---------------------------------------------------------------------------------------------------  Medications: Outpatient Medications Prior to Visit  Medication Sig   amLODipine (NORVASC) 5 MG tablet TAKE 1.5 TABLETS BY MOUTH DAILY.   baclofen (LIORESAL) 10 MG tablet Take 10 mg by mouth 2 (two) times daily as needed for muscle spasms.    benzonatate (TESSALON) 200 MG capsule Take 1 capsule (200 mg total) by mouth 2 (two) times daily as needed for cough.   BOTOX 100 UNITS SOLR injection Inject into the muscle every 3 (three) months.    clindamycin (CLEOCIN T) 1 % lotion APPLY TOPICALLY ONCE A DAY TO AREAS OF ACNE ON THE FACE   doxycycline (VIBRA-TABS) 100 MG tablet Take 1 tablet (100 mg total) by mouth 2 (two) times daily.   EMGALITY 120 MG/ML SOAJ Inject into the skin as needed.   EPINEPHrine 0.3 mg/0.3 mL IJ SOAJ injection Inject 0.3 mLs (0.3 mg total) into the muscle as needed for up to 1 dose for anaphylaxis (Call 911 if used.).   ezetimibe (ZETIA) 10 MG tablet TAKE 1 TABLET (10 MG TOTAL) BY MOUTH DAILY. PLEASE SCHEDULE APPOINTMENT WITH DR. Claiborne Billings FOR REFILLS.   fluocinonide ointment (LIDEX) 8.92 % Apply 1 application topically 2 (two) times daily.   furosemide (LASIX) 20 MG tablet Take 1 tablet (20 mg total) by mouth daily. (Patient taking differently: Take 20 mg by mouth 2 (two) times daily.)   glucose blood (ONETOUCH VERIO) test strip TEST  FASTING GLUCOSE DAILY AS DIRECTED   lamoTRIgine (LAMICTAL) 100 MG tablet Take 100 mg by mouth 2 (two) times daily.   Lancets (ONETOUCH ULTRASOFT) lancets Test fasting each morning and 2 hours before supper. Retest if having hypoglycemic symptoms.   metFORMIN (GLUCOPHAGE) 500 MG tablet TAKE 2 TABLETS BY MOUTH IN THE MORNING ,1 TABLET AT LUNCH, & TAKE 2 TABLETS IN THE EVENING   metoprolol tartrate (LOPRESSOR) 100 MG tablet TAKE 1 TABLET (100 MG TOTAL) BY MOUTH 2 (TWO) TIMES DAILY. KEEP OFFICE VISIT   promethazine (PHENERGAN) 25 MG tablet Take 25 mg by mouth every 4 (four) hours as needed.   rosuvastatin (CRESTOR) 20 MG tablet Take 1 tablet (20 mg total) by mouth daily.   TEGRETOL-XR 400 MG 12 hr tablet TAKE 1 TABLET BY MOUTH TWICE A DAY   [DISCONTINUED] busPIRone (BUSPAR) 10 MG tablet TAKE 1 TABLET (10 MG TOTAL) BY MOUTH 3 (THREE) TIMES DAILY. (AS NEEDED FOR ANXIETY)   [DISCONTINUED] hydrocortisone 1 % ointment Apply 1 application topically 2 (two) times daily. To areas of itching not to use if any blisters or open wounds. USE PRN itching.   [DISCONTINUED] levocetirizine (XYZAL) 5 MG tablet TAKE 1 TABLET BY MOUTH EVERY DAY AS NEEDED   [DISCONTINUED] VIIBRYD 40 MG TABS TAKE 1 TABLET BY MOUTH EVERY DAY   [DISCONTINUED] Azelastine HCl 0.15 % SOLN Place 2 sprays into both nostrils 2 (two) times daily.   No facility-administered medications prior to visit.    Review of Systems  Last CBC Lab Results  Component Value Date   WBC 6.3 08/15/2020   HGB 11.3 08/15/2020   HCT 34.5 08/15/2020   MCV 85 08/15/2020   MCH 27.9 08/15/2020   RDW 13.5 08/15/2020   PLT 255 02/40/9735   Last metabolic panel Lab Results  Component Value Date   GLUCOSE 151 (H) 01/09/2021   NA 141 01/09/2021   K 5.2 01/09/2021   CL 102 01/09/2021   CO2 25 01/09/2021   BUN 20 01/09/2021   CREATININE 1.26 (H) 01/09/2021   GFRNONAA 58 (L) 02/05/2020   GFRAA 66 02/05/2020   CALCIUM 10.3 01/09/2021   PHOS 3.1 08/22/2015    PROT 7.5 08/15/2020   ALBUMIN 4.8 08/15/2020   LABGLOB 2.7 08/15/2020   AGRATIO 1.8 08/15/2020   BILITOT <0.2 08/15/2020   ALKPHOS 63 08/15/2020   AST 26 08/15/2020   ALT 26 08/15/2020   ANIONGAP 10 02/27/2019   Last lipids Lab Results  Component Value Date   CHOL 165 08/15/2020   HDL 76 08/15/2020   LDLCALC 71 08/15/2020   TRIG 100 08/15/2020   CHOLHDL 2.2 08/15/2020   Last hemoglobin A1c Lab Results  Component Value Date   HGBA1C 8.4 (H) 08/15/2020   Last thyroid functions Lab Results  Component Value Date   TSH 2.150 08/15/2020   Last vitamin B12 and Folate Lab Results  Component Value Date   VITAMINB12 772 08/09/2013       Objective    BP 132/68 (BP Location: Right Arm, Patient Position: Sitting, Cuff Size: Large)   Pulse 68   Ht 5\' 3"  (1.6 m)   Wt 209 lb (94.8 kg)   LMP  (LMP Unknown)   BMI 37.02 kg/m  BP Readings from Last 3 Encounters:  01/16/21 132/68  01/09/21 122/74  08/15/20 (!) 146/70   Wt Readings from Last 3 Encounters:  01/16/21 209 lb (94.8 kg)  01/09/21 210 lb 9.6 oz (95.5 kg)  08/15/20 219 lb (99.3 kg)      Physical Exam Vitals and nursing note reviewed.  Constitutional:      General: She is not in acute distress.    Appearance: Normal appearance. She is obese. She is not ill-appearing, toxic-appearing or diaphoretic.  HENT:     Head: Normocephalic and atraumatic.     Nose: Rhinorrhea present. No congestion.  Eyes:     General:        Right eye: No discharge.        Left eye: No discharge.     Pupils: Pupils are equal, round, and reactive to light.  Cardiovascular:     Rate and Rhythm: Normal rate and regular rhythm.     Pulses: Normal pulses.     Heart sounds: Normal heart sounds. No murmur heard.   No friction rub. No gallop.  Pulmonary:     Effort: Pulmonary effort is normal. No respiratory distress.     Breath sounds: Normal breath sounds. No stridor. No wheezing, rhonchi or rales.  Chest:     Chest wall: No  tenderness.  Abdominal:     General: Bowel sounds are normal.     Palpations: Abdomen is soft.  Musculoskeletal:        General: No swelling, tenderness, deformity or signs of injury. Normal range of motion.     Right lower leg: No edema.     Left lower leg: No edema.  Skin:    General: Skin is warm and dry.     Capillary Refill:  Capillary refill takes less than 2 seconds.     Coloration: Skin is not jaundiced or pale.     Findings: No bruising, erythema, lesion or rash.  Neurological:     General: No focal deficit present.     Mental Status: She is alert and oriented to person, place, and time. Mental status is at baseline.     Cranial Nerves: No cranial nerve deficit.     Sensory: No sensory deficit.     Motor: No weakness.     Coordination: Coordination normal.  Psychiatric:        Mood and Affect: Mood is depressed. Affect is tearful.        Behavior: Behavior normal.        Thought Content: Thought content normal.        Judgment: Judgment normal.     No results found for any visits on 01/16/21.  Assessment & Plan     Problem List Items Addressed This Visit       Respiratory   Seasonal and perennial allergic rhinitis    Has gotten worse in past 3-4 months; refill of medications today No tenderness       Relevant Medications   levocetirizine (XYZAL) 5 MG tablet   Azelastine HCl 0.15 % SOLN   fluticasone (FLONASE) 50 MCG/ACT nasal spray     Musculoskeletal and Integument   Pruritus    Acute concern; various skin locations on BUE      Relevant Medications   hydrocortisone 1 % ointment     Other   Depression, major, recurrent, moderate (HCC) - Primary    Patient choose to not disclose what is going on; however, worsening lately Plans to re-engage therapist, Otila Kluver Wants to increase medication today      Relevant Medications   Vilazodone HCl (VIIBRYD) 40 MG TABS   busPIRone (BUSPAR) 15 MG tablet     Return if symptoms worsen or fail to improve.       Vonna Kotyk, FNP, have reviewed all documentation for this visit. The documentation on 01/16/21 for the exam, diagnosis, procedures, and orders are all accurate and complete.    Gwyneth Sprout, [place] [phone] (phone) [phone] (fax)  [place]

## 2021-01-16 NOTE — Assessment & Plan Note (Signed)
Patient choose to not disclose what is going on; however, worsening lately Plans to re-engage therapist, Otila Kluver Wants to increase medication today

## 2021-01-16 NOTE — Assessment & Plan Note (Addendum)
Has gotten worse in past 3-4 months; refill of medications today No tenderness

## 2021-01-22 DIAGNOSIS — M791 Myalgia, unspecified site: Secondary | ICD-10-CM | POA: Diagnosis not present

## 2021-01-22 DIAGNOSIS — M542 Cervicalgia: Secondary | ICD-10-CM | POA: Diagnosis not present

## 2021-01-22 DIAGNOSIS — G43719 Chronic migraine without aura, intractable, without status migrainosus: Secondary | ICD-10-CM | POA: Diagnosis not present

## 2021-01-23 ENCOUNTER — Other Ambulatory Visit: Payer: Self-pay | Admitting: Allergy & Immunology

## 2021-01-23 DIAGNOSIS — J302 Other seasonal allergic rhinitis: Secondary | ICD-10-CM

## 2021-01-29 ENCOUNTER — Other Ambulatory Visit: Payer: Self-pay | Admitting: Family Medicine

## 2021-02-18 ENCOUNTER — Other Ambulatory Visit: Payer: Self-pay | Admitting: Cardiovascular Disease

## 2021-03-03 DIAGNOSIS — M542 Cervicalgia: Secondary | ICD-10-CM | POA: Diagnosis not present

## 2021-03-03 DIAGNOSIS — G43719 Chronic migraine without aura, intractable, without status migrainosus: Secondary | ICD-10-CM | POA: Diagnosis not present

## 2021-03-03 DIAGNOSIS — M791 Myalgia, unspecified site: Secondary | ICD-10-CM | POA: Diagnosis not present

## 2021-03-24 ENCOUNTER — Other Ambulatory Visit: Payer: Self-pay | Admitting: Family Medicine

## 2021-03-24 NOTE — Telephone Encounter (Signed)
[facility] faxed refill request for the following medications:  TEGRETOL-XR 400 MG 12 hr tablet   Please advise.

## 2021-03-24 NOTE — Telephone Encounter (Signed)
Please advise refill?  LOV: 01/16/2021 NOV: none Last refill: 12/25/2020 #180 w/0 refills.

## 2021-03-25 MED ORDER — TEGRETOL-XR 400 MG PO TB12: 400.0000 mg | ORAL_TABLET | Freq: Two times a day (BID) | ORAL | 0 refills | Status: DC

## 2021-04-08 ENCOUNTER — Telehealth: Payer: Self-pay | Admitting: Family Medicine

## 2021-04-08 DIAGNOSIS — E119 Type 2 diabetes mellitus without complications: Secondary | ICD-10-CM

## 2021-04-08 MED ORDER — METFORMIN HCL 500 MG PO TABS: ORAL_TABLET | ORAL | 1 refills | Status: DC

## 2021-04-08 NOTE — Addendum Note (Signed)
Addended by: Julieta Bellini on: 04/08/2021 03:08 PM   Modules accepted: Orders

## 2021-04-08 NOTE — Telephone Encounter (Signed)
[facility] faxed refill request for the following medications:  metFORMIN (GLUCOPHAGE) 500 MG tablet   Please advise.

## 2021-05-02 ENCOUNTER — Other Ambulatory Visit: Payer: Self-pay | Admitting: Family Medicine

## 2021-05-02 NOTE — Telephone Encounter (Signed)
Requested Prescriptions  Pending Prescriptions Disp Refills   rosuvastatin (CRESTOR) 20 MG tablet [Pharmacy Med Name: ROSUVASTATIN CALCIUM 20 MG TAB] 90 tablet 0    Sig: TAKE 1 TABLET BY MOUTH EVERY DAY     Cardiovascular:  Antilipid - Statins Passed - 05/02/2021  2:21 AM      Passed - Total Cholesterol in normal range and within 360 days    Cholesterol, Total  Date Value Ref Range Status  08/15/2020 165 100 - 199 mg/dL Final   Cholesterol  Date Value Ref Range Status  08/16/2014 277 (H) mg/dL Final    Comment:    0-200 NOTE: New Reference Range  07/09/14          Passed - LDL in normal range and within 360 days    Ldl Cholesterol, Calc  Date Value Ref Range Status  08/16/2014 167 (H) mg/dL Final    Comment:    0-99 NOTE: New Reference Range:  07/09/14    LDL Cholesterol (Calc)  Date Value Ref Range Status  02/28/2017 190 (H) mg/dL (calc) Final    Comment:    LDL-C levels > or = 190 mg/dL may indicate familial  hypercholesterolemia (FH). Clinical assessment and  measurement of blood lipid levels should be  considered for all first degree relatives of  patients with an FH diagnosis.  For questions about testing for familial hypercholesterolemia, please call Insurance risk surveyor at [email or web address]. Duncan Dull, et al. J National Lipid Association  Recommendations for Patient-Centered Management of  Dyslipidemia: Part 1 Journal of Clinical Lipidology  2015;9(2), 129-169. Reference range: <100 . Desirable range <100 mg/dL for primary prevention;   <70 mg/dL for patients with CHD or diabetic patients  with > or = 2 CHD risk factors. Marland Kitchen LDL-C is now calculated using the Martin-Hopkins  calculation, which is a validated novel method providing  better accuracy than the Friedewald equation in the  estimation of LDL-C.  Cresenciano Genre et al. Annamaria Helling. 9323;557(32): 2061-2068  (http://education.[employer].com/faq/FAQ164)    LDL Chol Calc (NIH)  Date Value Ref  Range Status  08/15/2020 71 0 - 99 mg/dL Final         Passed - HDL in normal range and within 360 days    HDL Cholesterol  Date Value Ref Range Status  08/16/2014 77 mg/dL Final    Comment:    40-1000 NOTE: New Reference Range:  07/09/14    HDL  Date Value Ref Range Status  08/15/2020 76 >39 mg/dL Final         Passed - Triglycerides in normal range and within 360 days    Triglycerides  Date Value Ref Range Status  08/15/2020 100 0 - 149 mg/dL Final  08/16/2014 167 (H) mg/dL Final    Comment:    0-149 NOTE: New Reference Range  07/09/14          Passed - Patient is not pregnant      Passed - Valid encounter within last 12 months    Recent Outpatient Visits          3 months ago Depression, major, recurrent, moderate ([place])   [facility] Gwyneth Sprout, FNP   11 months ago Viral URI with cough   [place], PA-C   1 year ago Suspected COVID-19 virus infection   [facility], Vickki Muff, PA-C   1 year ago Anxiety, generalized   [facility], Vickki Muff, PA-C   1 year  ago Chronic pain of both knees   [facility], Vickki Muff, PA-C      Future Appointments            In 2 weeks Ernst Bowler Gwenith Daily, MD [facility] [place]

## 2021-05-04 DIAGNOSIS — J019 Acute sinusitis, unspecified: Secondary | ICD-10-CM | POA: Diagnosis not present

## 2021-05-04 DIAGNOSIS — B9689 Other specified bacterial agents as the cause of diseases classified elsewhere: Secondary | ICD-10-CM | POA: Diagnosis not present

## 2021-05-04 DIAGNOSIS — J209 Acute bronchitis, unspecified: Secondary | ICD-10-CM | POA: Diagnosis not present

## 2021-05-04 DIAGNOSIS — Z03818 Encounter for observation for suspected exposure to other biological agents ruled out: Secondary | ICD-10-CM | POA: Diagnosis not present

## 2021-05-08 ENCOUNTER — Other Ambulatory Visit: Payer: Self-pay | Admitting: Cardiovascular Disease

## 2021-05-08 ENCOUNTER — Telehealth: Payer: Self-pay

## 2021-05-08 DIAGNOSIS — E119 Type 2 diabetes mellitus without complications: Secondary | ICD-10-CM

## 2021-05-08 MED ORDER — ONETOUCH VERIO VI STRP: ORAL_STRIP | 3 refills | Status: DC

## 2021-05-08 NOTE — Telephone Encounter (Signed)
[facility] faxed refill request for the following medications:  glucose blood (ONETOUCH VERIO) test strip   Please advise.

## 2021-05-14 DIAGNOSIS — G518 Other disorders of facial nerve: Secondary | ICD-10-CM | POA: Diagnosis not present

## 2021-05-14 DIAGNOSIS — M542 Cervicalgia: Secondary | ICD-10-CM | POA: Diagnosis not present

## 2021-05-14 DIAGNOSIS — G43719 Chronic migraine without aura, intractable, without status migrainosus: Secondary | ICD-10-CM | POA: Diagnosis not present

## 2021-05-21 ENCOUNTER — Ambulatory Visit: Payer: Self-pay | Admitting: Allergy & Immunology

## 2021-05-25 DIAGNOSIS — Y92009 Unspecified place in unspecified non-institutional (private) residence as the place of occurrence of the external cause: Secondary | ICD-10-CM | POA: Diagnosis not present

## 2021-05-25 DIAGNOSIS — Z981 Arthrodesis status: Secondary | ICD-10-CM | POA: Diagnosis not present

## 2021-05-25 DIAGNOSIS — S6991XA Unspecified injury of right wrist, hand and finger(s), initial encounter: Secondary | ICD-10-CM | POA: Diagnosis not present

## 2021-05-25 DIAGNOSIS — S4991XA Unspecified injury of right shoulder and upper arm, initial encounter: Secondary | ICD-10-CM | POA: Diagnosis not present

## 2021-05-25 DIAGNOSIS — S8992XA Unspecified injury of left lower leg, initial encounter: Secondary | ICD-10-CM | POA: Diagnosis not present

## 2021-05-25 DIAGNOSIS — M79641 Pain in right hand: Secondary | ICD-10-CM | POA: Diagnosis not present

## 2021-05-25 DIAGNOSIS — W010XXA Fall on same level from slipping, tripping and stumbling without subsequent striking against object, initial encounter: Secondary | ICD-10-CM | POA: Diagnosis not present

## 2021-05-25 DIAGNOSIS — M1611 Unilateral primary osteoarthritis, right hip: Secondary | ICD-10-CM | POA: Diagnosis not present

## 2021-05-25 DIAGNOSIS — S8991XA Unspecified injury of right lower leg, initial encounter: Secondary | ICD-10-CM | POA: Diagnosis not present

## 2021-05-25 DIAGNOSIS — S79911A Unspecified injury of right hip, initial encounter: Secondary | ICD-10-CM | POA: Diagnosis not present

## 2021-05-25 DIAGNOSIS — M1711 Unilateral primary osteoarthritis, right knee: Secondary | ICD-10-CM | POA: Diagnosis not present

## 2021-05-25 DIAGNOSIS — M19041 Primary osteoarthritis, right hand: Secondary | ICD-10-CM | POA: Diagnosis not present

## 2021-05-25 DIAGNOSIS — M19031 Primary osteoarthritis, right wrist: Secondary | ICD-10-CM | POA: Diagnosis not present

## 2021-06-02 ENCOUNTER — Other Ambulatory Visit: Payer: Self-pay

## 2021-06-02 ENCOUNTER — Ambulatory Visit (INDEPENDENT_AMBULATORY_CARE_PROVIDER_SITE_OTHER): Payer: Medicare Other | Admitting: Family Medicine

## 2021-06-02 ENCOUNTER — Encounter: Payer: Self-pay | Admitting: Family Medicine

## 2021-06-02 VITALS — BP 126/78 | HR 70 | Temp 98.4°F | Wt 206.9 lb

## 2021-06-02 DIAGNOSIS — M25531 Pain in right wrist: Secondary | ICD-10-CM

## 2021-06-02 DIAGNOSIS — W19XXXD Unspecified fall, subsequent encounter: Secondary | ICD-10-CM | POA: Diagnosis not present

## 2021-06-02 DIAGNOSIS — M79641 Pain in right hand: Secondary | ICD-10-CM | POA: Diagnosis not present

## 2021-06-02 DIAGNOSIS — Z780 Asymptomatic menopausal state: Secondary | ICD-10-CM

## 2021-06-02 MED ORDER — DICLOFENAC SODIUM 1 % EX GEL: 4.0000 g | Freq: Four times a day (QID) | CUTANEOUS | 1 refills | Status: DC

## 2021-06-02 NOTE — Progress Notes (Signed)
° °  SUBJECTIVE:   CHIEF COMPLAINT / HPI:   RECENT FALL - going down small flight of stairs, wasn't looking and tripped over a bush at end of stairs into gravel. Fell onto R side and outstretched R hand. No head injury or LOC. Evaluated by UC 1/23, R hand/hip/shoulder and b/l knee xrays without fracture per UC report though unable to view official read. Was told she possibly has osteoporosis based on xray results. Given tramadol and prednisone for pain control.  - finished prednisone, helped. Only taken 2 of tramadol.  - still sore.  - s/p hysterectomy in 1990s.  OBJECTIVE:   BP 126/78 (BP Location: Left Arm, Patient Position: Sitting, Cuff Size: Normal)    Pulse 70    Temp 98.4 F (36.9 C) (Oral)    Wt 206 lb 14.4 oz (93.8 kg)    LMP  (LMP Unknown)    SpO2 97%    BMI 36.65 kg/m   Gen: well appearing, in NAD MSK: grip strength intact. Full AROM and 5/5 strength in abduction/adduction of fingers and thumb bilaterally. Wrist strength 5/5. No swelling, redness. Skin: bruising to R breast without swelling, laceration   ASSESSMENT/PLAN:   Fall Tripped over bush. Xrays negative for fracture. Trial diclofenac gel for pain relief. Will obtain DEXA given report of possible osteoporosis.  F/u prn.   Myles Gip, DO

## 2021-06-08 ENCOUNTER — Telehealth: Payer: Self-pay

## 2021-06-08 ENCOUNTER — Ambulatory Visit
Admission: RE | Admit: 2021-06-08 | Discharge: 2021-06-08 | Disposition: A | Discharge status: Home / Self Care | Payer: Medicare Other | Source: Ambulatory Visit | Attending: Family Medicine | Admitting: Family Medicine

## 2021-06-08 ENCOUNTER — Other Ambulatory Visit: Payer: Self-pay

## 2021-06-08 ENCOUNTER — Other Ambulatory Visit: Payer: Self-pay | Admitting: Physician Assistant

## 2021-06-08 DIAGNOSIS — N6489 Other specified disorders of breast: Secondary | ICD-10-CM

## 2021-06-08 DIAGNOSIS — Z780 Asymptomatic menopausal state: Secondary | ICD-10-CM | POA: Insufficient documentation

## 2021-06-08 DIAGNOSIS — Z1231 Encounter for screening mammogram for malignant neoplasm of breast: Secondary | ICD-10-CM

## 2021-06-08 DIAGNOSIS — N6310 Unspecified lump in the right breast, unspecified quadrant: Secondary | ICD-10-CM

## 2021-06-08 NOTE — Telephone Encounter (Signed)
Patient advised.

## 2021-06-08 NOTE — Telephone Encounter (Signed)
Copied from [place] [phone]. Topic: General - Other >> Jun 05, 2021 11:57 AM Parke Poisson wrote: Reason for CRM: Pt is requesting an order be put in for Abigail Figueroa for her yearly mammogram. Per [place] [facility]i this should be put be in as [facility]

## 2021-06-19 ENCOUNTER — Ambulatory Visit (INDEPENDENT_AMBULATORY_CARE_PROVIDER_SITE_OTHER): Payer: Medicare Other | Admitting: Physician Assistant

## 2021-06-19 ENCOUNTER — Encounter: Payer: Self-pay | Admitting: Physician Assistant

## 2021-06-19 ENCOUNTER — Other Ambulatory Visit: Payer: Self-pay

## 2021-06-19 VITALS — BP 136/72 | HR 82 | Temp 98.9°F | Resp 16 | Wt 213.0 lb

## 2021-06-19 DIAGNOSIS — T3695XA Adverse effect of unspecified systemic antibiotic, initial encounter: Secondary | ICD-10-CM

## 2021-06-19 DIAGNOSIS — J0140 Acute pansinusitis, unspecified: Secondary | ICD-10-CM | POA: Diagnosis not present

## 2021-06-19 DIAGNOSIS — B379 Candidiasis, unspecified: Secondary | ICD-10-CM | POA: Diagnosis not present

## 2021-06-19 MED ORDER — AMOXICILLIN-POT CLAVULANATE 875-125 MG PO TABS: 1.0000 | ORAL_TABLET | Freq: Two times a day (BID) | ORAL | 0 refills | Status: DC

## 2021-06-19 MED ORDER — FLUCONAZOLE 150 MG PO TABS: 150.0000 mg | ORAL_TABLET | Freq: Once | ORAL | 0 refills | Status: AC

## 2021-06-19 NOTE — Progress Notes (Signed)
Established patient visit   Patient: Abigail Figueroa   DOB: 05-08-1960   61 y.o. Female  MRN: [id number] Visit Date: 06/19/2021  Today's healthcare provider: Dani Gobble Kamdyn Covel, PA-C  Introduced myself to the patient as a Journalist, newspaper and provided education on APPs in clinical practice.     I,[name],acting as a scribe for [employer], PA-C.,have documented all relevant documentation on the behalf of [place], PA-C,as directed by  [employer], PA-C while in the presence of Tayllor Breitenstein E Roland Lipke, PA-C.   Chief Complaint  Patient presents with   URI    She went to UC last month and was prescribed Promethazine-DM and she is currently taking it.   Subjective    HPI HPI     URI   Associated symptoms inlclude achiness, congestion, cough, headache, rhinorrhea, sinus pain, sneezing and tooth pain (Scratchy throat).  Recent episode started in the past 7 days.  The problem has been gradually worsening since onset.  The temperature has been with in normal range.  Patient  is drinking plenty of fluids.  Past hisotry is significant for  occational episodes of bronchitis. Additional comments: She went to UC last month and was prescribed Promethazine-DM and she is currently taking it.      Last edited by Doristine Devoid, CMA on 06/19/2021  8:45 AM.      States she has congestion, itchy eyes, rhinorrhea, pain along the left side of her neck/jaw She states her teeth are hurting and she had body aches last night States her temp was 99.0 last night  Reports nonproductive cough, sneezing States her ears feel odd, not necessarily pain but there is a different sensation, denies ear fullness States symptoms have been going on for about 5 days She has been taking her allergy medication and nasal spray but this is not providing relief States she feels awful and is very tired today   Medications: Outpatient Medications Prior to Visit  Medication Sig   amLODipine (NORVASC) 5 MG tablet TAKE 1 AND  1/2 TABLETS BY MOUTH EVERY DAY   Azelastine HCl 0.15 % SOLN Place 2 sprays into both nostrils 2 (two) times daily.   baclofen (LIORESAL) 10 MG tablet Take 10 mg by mouth 2 (two) times daily as needed for muscle spasms.    BOTOX 100 UNITS SOLR injection Inject into the muscle every 3 (three) months.    busPIRone (BUSPAR) 15 MG tablet Take 1 tablet (15 mg total) by mouth 2 (two) times daily.   clindamycin (CLEOCIN T) 1 % lotion APPLY TOPICALLY ONCE A DAY TO AREAS OF ACNE ON THE FACE   diclofenac Sodium (VOLTAREN) 1 % GEL Apply 4 g topically 4 (four) times daily.   doxycycline (VIBRA-TABS) 100 MG tablet Take 1 tablet (100 mg total) by mouth 2 (two) times daily.   EMGALITY 120 MG/ML SOAJ Inject into the skin as needed.   EPINEPHrine 0.3 mg/0.3 mL IJ SOAJ injection Inject 0.3 mLs (0.3 mg total) into the muscle as needed for up to 1 dose for anaphylaxis (Call 911 if used.).   ezetimibe (ZETIA) 10 MG tablet TAKE 1 TABLET (10 MG TOTAL) BY MOUTH DAILY. PLEASE SCHEDULE APPOINTMENT WITH DR. Claiborne Billings FOR REFILLS.   fluocinonide ointment (LIDEX) 6.22 % Apply 1 application topically 2 (two) times daily.   fluticasone (FLONASE) 50 MCG/ACT nasal spray Place 2 sprays into both nostrils in the morning and at bedtime.   furosemide (LASIX) 20 MG  tablet TAKE 1 TABLET BY MOUTH EVERY DAY   glucose blood (ONETOUCH VERIO) test strip TEST FASTING GLUCOSE DAILY AS DIRECTED   hydrocortisone 1 % ointment Apply 1 application topically 2 (two) times daily. To areas of itching not to use if any blisters or open wounds. USE PRN itching.   lamoTRIgine (LAMICTAL) 100 MG tablet Take 100 mg by mouth 2 (two) times daily.   Lancets (ONETOUCH ULTRASOFT) lancets Test fasting each morning and 2 hours before supper. Retest if having hypoglycemic symptoms.   levocetirizine (XYZAL) 5 MG tablet TAKE 1 TABLET BY MOUTH EVERY DAY AS NEEDED   metFORMIN (GLUCOPHAGE) 500 MG tablet TAKE 2 TABLETS BY MOUTH IN THE MORNING ,1 TABLET AT LUNCH, & TAKE 2  TABLETS IN THE EVENING   metoprolol tartrate (LOPRESSOR) 100 MG tablet TAKE 1 TABLET (100 MG TOTAL) BY MOUTH 2 (TWO) TIMES DAILY. KEEP OFFICE VISIT   promethazine (PHENERGAN) 25 MG tablet Take 25 mg by mouth every 4 (four) hours as needed.   promethazine-dextromethorphan (PROMETHAZINE-DM) 6.25-15 MG/5ML syrup Take 5 mLs by mouth every 6 (six) hours as needed.   rosuvastatin (CRESTOR) 20 MG tablet TAKE 1 TABLET BY MOUTH EVERY DAY   TEGRETOL-XR 400 MG 12 hr tablet Take 1 tablet (400 mg total) by mouth 2 (two) times daily.   Vilazodone HCl (VIIBRYD) 40 MG TABS Take 1 tablet (40 mg total) by mouth daily.   benzonatate (TESSALON) 200 MG capsule Take 1 capsule (200 mg total) by mouth 2 (two) times daily as needed for cough.   No facility-administered medications prior to visit.    Review of Systems  Constitutional:  Negative for fever.  HENT:  Positive for congestion, rhinorrhea, sinus pain and sneezing. Negative for ear pain, postnasal drip and sore throat.   Eyes:  Positive for discharge and itching.  Respiratory:  Positive for cough. Negative for shortness of breath.   Cardiovascular:  Negative for chest pain.  Gastrointestinal:  Negative for diarrhea, nausea and vomiting.  Musculoskeletal:  Positive for arthralgias and myalgias. Negative for neck pain and neck stiffness.  Skin:  Negative for rash.  Neurological:  Positive for dizziness and headaches. Negative for light-headedness.      Objective    BP 136/72 (BP Location: Left Arm, Patient Position: Sitting, Cuff Size: Large)    Pulse 82    Temp 98.9 F (37.2 C) (Oral)    Resp 16    Wt 213 lb (96.6 kg)    LMP  (LMP Unknown)    SpO2 96%    BMI 37.73 kg/m  {Show previous vital signs (optional):23777}  Physical Exam Constitutional:      Appearance: Normal appearance. She is ill-appearing.  HENT:     Head: Normocephalic and atraumatic.     Right Ear: Tympanic membrane, ear canal and external ear normal.     Left Ear: Tympanic membrane,  ear canal and external ear normal.     Nose: Congestion present.     Right Turbinates: Enlarged and swollen.     Left Turbinates: Enlarged and swollen.     Mouth/Throat:     Mouth: Mucous membranes are moist.     Pharynx: No oropharyngeal exudate or posterior oropharyngeal erythema.  Eyes:     Extraocular Movements: Extraocular movements intact.     Conjunctiva/sclera: Conjunctivae normal.  Cardiovascular:     Rate and Rhythm: Normal rate and regular rhythm.     Pulses: Normal pulses.     Heart sounds: Normal heart sounds.  Pulmonary:  Effort: Pulmonary effort is normal.     Breath sounds: Normal breath sounds. No wheezing, rhonchi or rales.  Musculoskeletal:     Cervical back: Normal range of motion and neck supple.  Lymphadenopathy:     Head:     Right side of head: No submental or submandibular adenopathy.     Left side of head: Submandibular adenopathy present. No submental adenopathy.     Cervical: Cervical adenopathy present.     Left cervical: Superficial cervical adenopathy present.     Upper Body:     Right upper body: No supraclavicular adenopathy.     Left upper body: No supraclavicular adenopathy.  Neurological:     Mental Status: She is alert.      No results found for any visits on 06/19/21.  Assessment & Plan     Problem List Items Addressed This Visit   None Visit Diagnoses     Acute pansinusitis, recurrence not specified    -  Primary Acute, new problem Symptoms have not resolved with at home management and appear consistent with bacterial sinusitis  Recommend rest, hydration, symptomatic management with OTC medications  Provided script for augmentin to help with resolution  Previous lab results indicate potential unmanaged DM along with impaired kidney function Discussed use Tylenol instead of NSAIDs and importance of managing chronic conditions to prevent complications from infection in the future     Relevant Medications    promethazine-dextromethorphan (PROMETHAZINE-DM) 6.25-15 MG/5ML syrup   amoxicillin-clavulanate (AUGMENTIN) 875-125 MG tablet   fluconazole (DIFLUCAN) 150 MG tablet   Antibiotic-induced yeast infection     Patient states she sometimes gets yeast infections from abx use Fluconazole provided in case she starts developing symptoms of yeast infection.     Relevant Medications   fluconazole (DIFLUCAN) 150 MG tablet        No follow-ups on file.   I, Bricen Victory E Elayjah Chaney, PA-C, have reviewed all documentation for this visit. The documentation on 06/19/21 for the exam, diagnosis, procedures, and orders are all accurate and complete.   Zeeva Courser, [place] MPH [employer], PA-C  [facility] [phone] (phone) [phone] (fax)  [place]

## 2021-06-19 NOTE — Patient Instructions (Addendum)
Based on your symptoms I believe you have a bacterial sinus infection and I am sending in an antibiotic for you  Please use yogurt, probiotics, and other live culture supplements, food, drinks to help prevent diarrhea while using the antibiotic Make sure you stay well hydrated and rest thoroughly while recovering  Symptoms can last for 3-10 days with lingering cough and intermittent symptoms lasting weeks after that.  The goal of treatment at this time is to reduce your symptoms and discomfort while the antibiotic is doing it's job  You can use over the counter medications such as Dayquil/Nyquil, AlkaSeltzer formulations, etc to provide further relief of symptoms according to the manufacturer's instructions  If preferred you can use Coricidin to manage your symptoms rather than those medications mentioned above.   If your symptoms do not improve or become worse in the next 5-7 days please make an apt at the office so we can see you  Go to the ER if you begin to have more serious symptoms such as shortness of breath, trouble breathing, loss of consciousness, swelling around the eyes, high fever, severe lasting headaches, vision changes or neck pain/stiffness.

## 2021-06-22 ENCOUNTER — Ambulatory Visit: Payer: Self-pay | Admitting: *Deleted

## 2021-06-22 DIAGNOSIS — J0140 Acute pansinusitis, unspecified: Secondary | ICD-10-CM

## 2021-06-22 MED ORDER — DOXYCYCLINE HYCLATE 100 MG PO TABS: 100.0000 mg | ORAL_TABLET | Freq: Two times a day (BID) | ORAL | 0 refills | Status: AC

## 2021-06-22 NOTE — Addendum Note (Signed)
Addended by: Talitha Givens on: 06/22/2021 01:15 PM   Modules accepted: Orders

## 2021-06-22 NOTE — Telephone Encounter (Signed)
Patient was advised.  

## 2021-06-22 NOTE — Telephone Encounter (Signed)
States had sinus infection had Amoxicillin and got hives so stopped Amoxicillin  Chief Complaint: hives Symptoms: stopped when stopped med Frequency: gone Pertinent Negatives: Patient denies hives went away when stopped amoxicillin Disposition: [] ED /[] Urgent Care (no appt availability in office) / [] Appointment(In office/virtual)/ []  [facility]/ [] Home Care/ [] Refused Recommended Disposition /[] [facility]/ [x]  Follow-up with PCP Additional Notes: Pt stopped Amoxicillin, hives went away, is requesting different antibiotic for her  Sinus infection. Reason for Disposition  Localized hives  Answer Assessment - Initial Assessment Questions 1. APPEARANCE: "What does the rash look like?"      Hives when took med Amoxicillin 2. LOCATION: "Where is the rash located?"      Wide spread 3. NUMBER: "How many hives are there?"      About 10  4. SIZE: "How big are the hives?" (inches, cm, compare to coins) "Do they all look the same or is there lots of variation in shape and size?"      Size of welps like quarter 5. ONSET: "When did the hives begin?" (Hours or days ago)      After 3rd dose 6. ITCHING: "Does it itch?" If Yes, ask: "How bad is the itch?"    - MILD: doesn't interfere with normal activities   - MODERATE-SEVERE: interferes with work, school, sleep, or other activities      Almost goin because stopped taking. 7. RECURRENT PROBLEM: "Have you had hives before?" If Yes, ask: "When was the last time?" and "What happened that time?"      yes 8. TRIGGERS: "Were you exposed to any new food, plant, cosmetic product or animal just before the hives began?"     No, amoxicillin 9. OTHER SYMPTOMS: "Do you have any other symptoms?" (e.g., fever, tongue swelling, difficulty breathing, abdominal pain)     no 10. PREGNANCY: "Is there any chance you are pregnant?" "When was your last menstrual period?"       no  Protocols used: Hives-A-AH

## 2021-06-24 ENCOUNTER — Other Ambulatory Visit: Payer: Self-pay | Admitting: Family Medicine

## 2021-06-24 ENCOUNTER — Other Ambulatory Visit: Payer: Self-pay

## 2021-06-24 DIAGNOSIS — S60051A Contusion of right little finger without damage to nail, initial encounter: Secondary | ICD-10-CM | POA: Diagnosis not present

## 2021-06-24 DIAGNOSIS — S60221A Contusion of right hand, initial encounter: Secondary | ICD-10-CM | POA: Diagnosis not present

## 2021-06-24 DIAGNOSIS — M79641 Pain in right hand: Secondary | ICD-10-CM | POA: Diagnosis not present

## 2021-06-24 MED ORDER — TEGRETOL-XR 400 MG PO TB12: 400.0000 mg | ORAL_TABLET | Freq: Two times a day (BID) | ORAL | 0 refills | Status: DC

## 2021-06-24 NOTE — Telephone Encounter (Signed)
Requested medication (s) are due for refill today: yes  Requested medication (s) are on the active medication list: yes  Last refill:  03/25/21 #180 with 0 RF  Future visit scheduled: no  Notes to clinic:  Carbamazepine level from 2015, therefore failed protocol, do not see a visit that addresses this med/dx, please assess.      Requested Prescriptions  Pending Prescriptions Disp Refills   TEGRETOL-XR 400 MG 12 hr tablet [Pharmacy Med Name: TEGRETOL XR 400 MG TABLET] 180 tablet 0    Sig: TAKE 1 TABLET BY MOUTH TWICE A DAY     Neurology:  Anticonvulsants - carbamazepine Failed - 06/24/2021  1:37 AM      Failed - Carbamazepine (serum) in normal range and within 360 days    Carbamazepine, Total  Date Value Ref Range Status  08/09/2013 4.9 4.0 - 12.0 mcg/mL Final   Carbamazepine Lvl  Date Value Ref Range Status  01/23/2016 4.4 4.0 - 12.0 ug/mL Final          Failed - Cr in normal range and within 360 days    Creat  Date Value Ref Range Status  02/28/2017 1.03 0.50 - 1.05 mg/dL Final    Comment:    For patients >32 years of age, the reference limit for Creatinine is approximately 13% higher for people identified as African-American. .    Creatinine, Ser  Date Value Ref Range Status  01/09/2021 1.26 (H) 0.57 - 1.00 mg/dL Final   Creatinine, POC  Date Value Ref Range Status  07/20/2016 NA mg/dL Final          Passed - AST in normal range and within 360 days    AST  Date Value Ref Range Status  08/15/2020 26 0 - 40 IU/L Final   SGOT(AST)  Date Value Ref Range Status  08/16/2014 54 (H) U/L Final    Comment:    15-41 NOTE: New Reference Range  07/09/14           Passed - ALT in normal range and within 360 days    ALT  Date Value Ref Range Status  08/15/2020 26 0 - 32 IU/L Final   SGPT (ALT)  Date Value Ref Range Status  08/16/2014 49 U/L Final    Comment:    14-54 NOTE: New Reference Range  07/09/14           Passed - WBC in normal range and  within 360 days    WBC  Date Value Ref Range Status  08/15/2020 6.3 3.4 - 10.8 x10E3/uL Final  02/27/2019 5.3 4.0 - 10.5 K/uL Final          Passed - PLT in normal range and within 360 days    Platelets  Date Value Ref Range Status  08/15/2020 255 150 - 450 x10E3/uL Final          Passed - HGB in normal range and within 360 days    Hemoglobin  Date Value Ref Range Status  08/15/2020 11.3 11.1 - 15.9 g/dL Final          Passed - Na in normal range and within 360 days    Sodium  Date Value Ref Range Status  01/09/2021 141 134 - 144 mmol/L Final  08/16/2014 144 mmol/L Final    Comment:    135-145 NOTE: New Reference Range  07/09/14           Passed - HCT in normal range and within 360 days  Hematocrit  Date Value Ref Range Status  08/15/2020 34.5 34.0 - 46.6 % Final          Passed - Completed PHQ-2 or PHQ-9 in the last 360 days      Passed - Valid encounter within last 12 months    Recent Outpatient Visits           5 days ago Acute pansinusitis, recurrence not specified   [place], PA-C   3 weeks ago Fall, subsequent encounter   [place], DO   5 months ago Depression, major, recurrent, moderate [facility])   [facility] Gwyneth Sprout, FNP   1 year ago Viral URI with cough   [place], PA-C   1 year ago Suspected COVID-19 virus infection   [place], Vickki Muff, [place]

## 2021-06-25 DIAGNOSIS — G518 Other disorders of facial nerve: Secondary | ICD-10-CM | POA: Diagnosis not present

## 2021-06-25 DIAGNOSIS — G43719 Chronic migraine without aura, intractable, without status migrainosus: Secondary | ICD-10-CM | POA: Diagnosis not present

## 2021-06-25 DIAGNOSIS — M542 Cervicalgia: Secondary | ICD-10-CM | POA: Diagnosis not present

## 2021-06-25 DIAGNOSIS — M791 Myalgia, unspecified site: Secondary | ICD-10-CM | POA: Diagnosis not present

## 2021-06-30 ENCOUNTER — Other Ambulatory Visit: Payer: Medicare Other

## 2021-06-30 DIAGNOSIS — M4316 Spondylolisthesis, lumbar region: Secondary | ICD-10-CM | POA: Diagnosis not present

## 2021-06-30 DIAGNOSIS — I10 Essential (primary) hypertension: Secondary | ICD-10-CM | POA: Diagnosis not present

## 2021-06-30 DIAGNOSIS — M545 Low back pain, unspecified: Secondary | ICD-10-CM | POA: Diagnosis not present

## 2021-06-30 DIAGNOSIS — G4733 Obstructive sleep apnea (adult) (pediatric): Secondary | ICD-10-CM | POA: Diagnosis not present

## 2021-06-30 DIAGNOSIS — G8929 Other chronic pain: Secondary | ICD-10-CM | POA: Diagnosis not present

## 2021-06-30 DIAGNOSIS — M5136 Other intervertebral disc degeneration, lumbar region: Secondary | ICD-10-CM | POA: Diagnosis not present

## 2021-07-07 ENCOUNTER — Ambulatory Visit
Admission: RE | Admit: 2021-07-07 | Discharge: 2021-07-07 | Disposition: A | Discharge status: Home / Self Care | Payer: Medicare Other | Source: Ambulatory Visit | Attending: Physician Assistant | Admitting: Physician Assistant

## 2021-07-07 ENCOUNTER — Other Ambulatory Visit: Payer: Medicare Other

## 2021-07-07 ENCOUNTER — Other Ambulatory Visit: Payer: Self-pay

## 2021-07-07 DIAGNOSIS — N6310 Unspecified lump in the right breast, unspecified quadrant: Secondary | ICD-10-CM

## 2021-07-07 DIAGNOSIS — R922 Inconclusive mammogram: Secondary | ICD-10-CM | POA: Diagnosis not present

## 2021-07-07 DIAGNOSIS — N6489 Other specified disorders of breast: Secondary | ICD-10-CM | POA: Diagnosis not present

## 2021-07-21 DIAGNOSIS — G43719 Chronic migraine without aura, intractable, without status migrainosus: Secondary | ICD-10-CM | POA: Diagnosis not present

## 2021-07-21 DIAGNOSIS — M542 Cervicalgia: Secondary | ICD-10-CM | POA: Diagnosis not present

## 2021-07-21 DIAGNOSIS — M791 Myalgia, unspecified site: Secondary | ICD-10-CM | POA: Diagnosis not present

## 2021-07-21 DIAGNOSIS — G518 Other disorders of facial nerve: Secondary | ICD-10-CM | POA: Diagnosis not present

## 2021-07-24 ENCOUNTER — Other Ambulatory Visit: Payer: Self-pay | Admitting: Physician Assistant

## 2021-07-24 DIAGNOSIS — G40909 Epilepsy, unspecified, not intractable, without status epilepticus: Secondary | ICD-10-CM

## 2021-07-29 ENCOUNTER — Other Ambulatory Visit: Payer: Self-pay | Admitting: Family Medicine

## 2021-07-30 ENCOUNTER — Other Ambulatory Visit: Payer: Self-pay | Admitting: Cardiovascular Disease

## 2021-08-12 DIAGNOSIS — M6281 Muscle weakness (generalized): Secondary | ICD-10-CM | POA: Diagnosis not present

## 2021-08-12 DIAGNOSIS — M545 Low back pain, unspecified: Secondary | ICD-10-CM | POA: Diagnosis not present

## 2021-08-13 DIAGNOSIS — M542 Cervicalgia: Secondary | ICD-10-CM | POA: Diagnosis not present

## 2021-08-13 DIAGNOSIS — G43719 Chronic migraine without aura, intractable, without status migrainosus: Secondary | ICD-10-CM | POA: Diagnosis not present

## 2021-08-13 DIAGNOSIS — G518 Other disorders of facial nerve: Secondary | ICD-10-CM | POA: Diagnosis not present

## 2021-08-19 ENCOUNTER — Other Ambulatory Visit: Payer: Self-pay | Admitting: Cardiovascular Disease

## 2021-08-27 ENCOUNTER — Other Ambulatory Visit: Payer: Self-pay | Admitting: Physician Assistant

## 2021-09-17 DIAGNOSIS — G43719 Chronic migraine without aura, intractable, without status migrainosus: Secondary | ICD-10-CM | POA: Diagnosis not present

## 2021-09-17 DIAGNOSIS — M542 Cervicalgia: Secondary | ICD-10-CM | POA: Diagnosis not present

## 2021-09-17 DIAGNOSIS — M791 Myalgia, unspecified site: Secondary | ICD-10-CM | POA: Diagnosis not present

## 2021-09-18 ENCOUNTER — Ambulatory Visit: Payer: Medicare Other | Admitting: Physician Assistant

## 2021-09-22 ENCOUNTER — Ambulatory Visit (INDEPENDENT_AMBULATORY_CARE_PROVIDER_SITE_OTHER): Payer: Medicare Other | Admitting: Physician Assistant

## 2021-09-22 ENCOUNTER — Encounter: Payer: Self-pay | Admitting: Physician Assistant

## 2021-09-22 VITALS — BP 137/71 | HR 64 | Temp 98.1°F | Resp 16 | Wt 210.0 lb

## 2021-09-22 DIAGNOSIS — I509 Heart failure, unspecified: Secondary | ICD-10-CM

## 2021-09-22 DIAGNOSIS — F411 Generalized anxiety disorder: Secondary | ICD-10-CM | POA: Diagnosis not present

## 2021-09-22 DIAGNOSIS — G40909 Epilepsy, unspecified, not intractable, without status epilepticus: Secondary | ICD-10-CM

## 2021-09-22 DIAGNOSIS — G43001 Migraine without aura, not intractable, with status migrainosus: Secondary | ICD-10-CM | POA: Diagnosis not present

## 2021-09-22 DIAGNOSIS — E7800 Pure hypercholesterolemia, unspecified: Secondary | ICD-10-CM

## 2021-09-22 DIAGNOSIS — E1169 Type 2 diabetes mellitus with other specified complication: Secondary | ICD-10-CM | POA: Diagnosis not present

## 2021-09-22 DIAGNOSIS — E119 Type 2 diabetes mellitus without complications: Secondary | ICD-10-CM

## 2021-09-22 DIAGNOSIS — F331 Major depressive disorder, recurrent, moderate: Secondary | ICD-10-CM

## 2021-09-22 DIAGNOSIS — E669 Obesity, unspecified: Secondary | ICD-10-CM

## 2021-09-22 DIAGNOSIS — R600 Localized edema: Secondary | ICD-10-CM

## 2021-09-22 DIAGNOSIS — Z8679 Personal history of other diseases of the circulatory system: Secondary | ICD-10-CM | POA: Diagnosis not present

## 2021-09-22 DIAGNOSIS — G4733 Obstructive sleep apnea (adult) (pediatric): Secondary | ICD-10-CM

## 2021-09-22 LAB — POCT GLYCOSYLATED HEMOGLOBIN (HGB A1C)
Est. average glucose Bld gHb Est-mCnc: 217
Hemoglobin A1C: 9.2 % — AB (ref 4.0–5.6)

## 2021-09-22 MED ORDER — SEMAGLUTIDE 3 MG PO TABS: 1.0000 | ORAL_TABLET | Freq: Every day | ORAL | 0 refills | Status: DC

## 2021-09-22 NOTE — Progress Notes (Unsigned)
I,[name],acting as a Education administrator for [employer], PA-C.,have documented all relevant documentation on the behalf of Abigail Speak, PA-C,as directed by  [employer], PA-C while in the presence of [employer], PA-C.   Established patient visit   Patient: Abigail Figueroa   DOB: 08/10/1960   61 y.o. Female  MRN: [id number] Visit Date: 09/22/2021  Today's healthcare provider: [name], PA-C   Chief Complaint  Patient presents with  . Hypertension  . Diabetes  . Seizures   Subjective    HPI  Hypertension, follow-up  BP Readings from Last 3 Encounters:  09/22/21 137/71  06/19/21 136/72  06/02/21 126/78   Wt Readings from Last 3 Encounters:  09/22/21 210 lb (95.3 kg)  06/19/21 213 lb (96.6 kg)  06/02/21 206 lb 14.4 oz (93.8 kg)     She was last seen for hypertension more than 1 year ago.    Management since that visit includes continuing same treatment.  She reports good compliance with treatment. She is not having side effects.  She is following a Regular diet. She is exercising. She does not smoke.  Use of agents associated with hypertension: none.   Outside blood pressures are not checked. Symptoms: No chest pain No chest pressure  No palpitations No syncope  No dyspnea No orthopnea  No paroxysmal nocturnal dyspnea No lower extremity edema   Pertinent labs Lab Results  Component Value Date   CHOL 165 08/15/2020   HDL 76 08/15/2020   LDLCALC 71 08/15/2020   TRIG 100 08/15/2020   CHOLHDL 2.2 08/15/2020   Lab Results  Component Value Date   NA 141 01/09/2021   K 5.2 01/09/2021   CREATININE 1.26 (H) 01/09/2021   EGFR 49 (L) 01/09/2021   GLUCOSE 151 (H) 01/09/2021   TSH 2.150 08/15/2020     The 10-year ASCVD risk score (Arnett DK, et al., 2019) is: 13.3%  ---------------------------------------------------------------------------------------------------   Follow up for seizure disorder:  The patient was last seen for this more  than 1 year ago.   Changes made at last visit include none.  She reports good compliance with treatment. She feels that condition is Unchanged. She is not having side effects.   -----------------------------------------------------------------------------------------   Diabetes Mellitus Type II, Follow-up  Lab Results  Component Value Date   HGBA1C 9.2 (A) 09/22/2021   HGBA1C 8.4 (H) 08/15/2020   HGBA1C 8.6 (H) 02/05/2020   Wt Readings from Last 3 Encounters:  09/22/21 210 lb (95.3 kg)  06/19/21 213 lb (96.6 kg)  06/02/21 206 lb 14.4 oz (93.8 kg)   Last seen for diabetes more than 1 year ago.   Management since then includes continuing same treatment. She reports good compliance with treatment. She is not having side effects.  Symptoms: No fatigue No foot ulcerations  No appetite changes No nausea  No paresthesia of the feet  No polydipsia  No polyuria No visual disturbances   No vomiting     Home blood sugar records:  blood sugars are not checked  Episodes of hypoglycemia? No    Current insulin regiment: none Most Recent Eye Exam: 12/02/2020 Current exercise: walking Current diet habits: in general, an "unhealthy" diet  Pertinent Labs: Lab Results  Component Value Date   CHOL 165 08/15/2020   HDL 76 08/15/2020   LDLCALC 71 08/15/2020   TRIG 100 08/15/2020   CHOLHDL 2.2 08/15/2020   Lab Results  Component Value Date   NA 141 01/09/2021   K 5.2  01/09/2021   CREATININE 1.26 (H) 01/09/2021   EGFR 49 (L) 01/09/2021   MICROALBUR 37.6 (H) 02/27/2019     ---------------------------------------------------------------------------------------------------   Medications: Outpatient Medications Prior to Visit  Medication Sig  . amLODipine (NORVASC) 5 MG tablet TAKE 1 AND 1/2 TABLETS BY MOUTH EVERY DAY  . Azelastine HCl 0.15 % SOLN Place 2 sprays into both nostrils 2 (two) times daily.  . baclofen (LIORESAL) 10 MG tablet Take 10 mg by mouth 2 (two) times daily as  needed for muscle spasms.   Marland Kitchen BOTOX 100 UNITS SOLR injection Inject into the muscle every 3 (three) months.   . busPIRone (BUSPAR) 15 MG tablet Take 1 tablet (15 mg total) by mouth 2 (two) times daily.  . clindamycin (CLEOCIN T) 1 % lotion APPLY TOPICALLY ONCE A DAY TO AREAS OF ACNE ON THE FACE  . diclofenac Sodium (VOLTAREN) 1 % GEL Apply 4 g topically 4 (four) times daily.  Marland Kitchen EMGALITY 120 MG/ML SOAJ Inject into the skin as needed.  Marland Kitchen EPINEPHrine 0.3 mg/0.3 mL IJ SOAJ injection Inject 0.3 mLs (0.3 mg total) into the muscle as needed for up to 1 dose for anaphylaxis (Call 911 if used.).  Marland Kitchen ezetimibe (ZETIA) 10 MG tablet TAKE 1 TABLET (10 MG TOTAL) BY MOUTH DAILY. PLEASE SCHEDULE APPOINTMENT WITH DR. Claiborne Billings FOR REFILLS.  . fluocinonide ointment (LIDEX) 3.81 % Apply 1 application topically 2 (two) times daily.  . fluticasone (FLONASE) 50 MCG/ACT nasal spray Place 2 sprays into both nostrils in the morning and at bedtime.  . furosemide (LASIX) 20 MG tablet TAKE 1 TABLET BY MOUTH EVERY DAY  . glucose blood (ONETOUCH VERIO) test strip TEST FASTING GLUCOSE DAILY AS DIRECTED  . hydrocortisone 1 % ointment Apply 1 application topically 2 (two) times daily. To areas of itching not to use if any blisters or open wounds. USE PRN itching.  . lamoTRIgine (LAMICTAL) 100 MG tablet Take 100 mg by mouth 2 (two) times daily.  . Lancets (ONETOUCH ULTRASOFT) lancets Test fasting each morning and 2 hours before supper. Retest if having hypoglycemic symptoms.  Marland Kitchen levocetirizine (XYZAL) 5 MG tablet TAKE 1 TABLET BY MOUTH EVERY DAY AS NEEDED  . metFORMIN (GLUCOPHAGE) 500 MG tablet TAKE 2 TABLETS BY MOUTH IN THE MORNING ,1 TABLET AT LUNCH, & TAKE 2 TABLETS IN THE EVENING  . metoprolol tartrate (LOPRESSOR) 100 MG tablet TAKE 1 TABLET (100 MG TOTAL) BY MOUTH 2 (TWO) TIMES DAILY. KEEP OFFICE VISIT  . promethazine (PHENERGAN) 25 MG tablet Take 25 mg by mouth every 4 (four) hours as needed.  . rosuvastatin (CRESTOR) 20 MG tablet  TAKE 1 TABLET BY MOUTH EVERY DAY  . TEGRETOL-XR 400 MG 12 hr tablet TAKE 1 TABLET BY MOUTH TWICE A DAY  . Vilazodone HCl (VIIBRYD) 40 MG TABS Take 1 tablet (40 mg total) by mouth daily.  . promethazine-dextromethorphan (PROMETHAZINE-DM) 6.25-15 MG/5ML syrup Take 5 mLs by mouth every 6 (six) hours as needed. (Patient not taking: Reported on 09/22/2021)   No facility-administered medications prior to visit.    Review of Systems  Constitutional:  Negative for appetite change, chills, fatigue and fever.  Respiratory:  Negative for chest tightness and shortness of breath.   Cardiovascular:  Negative for chest pain and palpitations.  Gastrointestinal:  Negative for abdominal pain, nausea and vomiting.  Musculoskeletal:  Positive for back pain (sharp pain on right sidex 3 days; worse with movement).  Neurological:  Negative for dizziness and weakness.   {Labs  Heme  Chem  Endocrine  Serology  Results Review (optional):23779}   Objective    BP 137/71 (BP Location: Right Arm, Patient Position: Sitting, Cuff Size: Large)   Pulse 64   Temp 98.1 F (36.7 C) (Oral)   Resp 16   Wt 210 lb (95.3 kg)   LMP  (LMP Unknown)   SpO2 99% Comment: room air  BMI 37.20 kg/m  {Show previous vital signs (optional):23777}  Physical Exam  ***  Results for orders placed or performed in visit on 09/22/21  POCT HgB A1C  Result Value Ref Range   Hemoglobin A1C 9.2 (A) 4.0 - 5.6 %   Est. average glucose Bld gHb Est-mCnc 217     Assessment & Plan     1. Type 2 diabetes mellitus without complication, without long-term current use of insulin (HCC) - POCT HgB A1C was 9.2 uncontrolled - Start semaglutide 3 MG TABS; Take 1 tablet by mouth daily.  Dispense: 30 tablet; Refill: 0 - CBC - Comprehensive metabolic panel - Lipid panel - Urine Microalbumin w/creat. ratio Patient refused to take insulin or semaglutide injections, or to take glipizide. Benefits of taking the abovementioned meds were explained in  details. Agreed to tray semaglutide in tablets Continue metformin Strongly encouraged to adhere to low sugar diet 2. Depression, major, recurrent, moderate (HCC) Continue BuSpar 15 mg PHQ-9 score was  3. Hypercholesterolemia Takes Zetia 10 mg and oral Crestor 20 mg - Lipid panel Managed by cardiology Dr. Claiborne Billings  4. Generalized anxiety disorder Continue BuSpar 15 mg GAD-7 score was  5. H/O: HTN (hypertension) Blood pressure today 137/71, well controlled Continue metoprolol, amlodipine Managed by cardiology, Dr. Claiborne Billings - CBC - Comprehensive metabolic panel - Lipid panel  6. Obstructive sleep apnea Noncompliant with CPAP machine Managed by Dr. Claiborne Billings Strongly encourage to use CPAP machine  7. Migraine without aura and with status migrainosus, not intractable Continue Lamictal 100 mg [facility]  8. Lower extremity edema Increase Lasix from 40 mg to 60 mg daily  9. Morbid obesity (HCC) BMI 37. 20 - CBC - Comprehensive metabolic panel - Lipid panel - TSH Encourage lifestyle modification via diet and exercise  10. Seizure disorder [facility]) Last was seen by neurology 25 years ago - Ambulatory referral to Neurology - Comprehensive metabolic panel  11.  Back muscle sprain Post 2 or 3 surgical procedures2     Recommended topical anti-inflammatory creams  Follow-up in 1 month     The patient was advised to call back or seek an in-person evaluation if the symptoms worsen or if the condition fails to improve as anticipated.  I discussed the assessment and treatment plan with the patient. The patient was provided an opportunity to ask questions and all were answered. The patient agreed with the plan and demonstrated an understanding of the instructions.  The entirety of the information documented in the History of Present Illness, Review of Systems and Physical Exam were personally obtained by me. Portions of this information were  initially documented by the CMA and reviewed by me for thoroughness and accuracy.  Portions of this note were created using dictation software and may contain typographical errors.   Abigail Speak, PA-C  [facility] [phone] (phone) [phone] (fax)  [place]

## 2021-10-01 ENCOUNTER — Other Ambulatory Visit: Payer: Self-pay | Admitting: Physician Assistant

## 2021-10-01 ENCOUNTER — Other Ambulatory Visit: Payer: Self-pay

## 2021-10-01 DIAGNOSIS — J0101 Acute recurrent maxillary sinusitis: Secondary | ICD-10-CM

## 2021-10-06 ENCOUNTER — Telehealth: Payer: Self-pay

## 2021-10-06 NOTE — Telephone Encounter (Signed)
Copied from [place]. Topic: General - Call Back - No Documentation >> Oct 06, 2021  4:12 PM Erick Blinks wrote: Reason for CRM: Pt called and is requesting to switch providers, pt does not want to see [name]. Pt wants to speak to the practice admin about her concerns with PCP.   Best contact: [phone]

## 2021-10-09 ENCOUNTER — Other Ambulatory Visit: Payer: Self-pay | Admitting: Physician Assistant

## 2021-10-09 DIAGNOSIS — E119 Type 2 diabetes mellitus without complications: Secondary | ICD-10-CM

## 2021-10-09 NOTE — Telephone Encounter (Signed)
Requested medication (s) are due for refill today - no  Requested medication (s) are on the active medication list -no  Future visit scheduled -yes  Last refill: unsure  Notes to clinic: medication not listed on current medication list, off protocol medication   Requested Prescriptions  Pending Prescriptions Disp Refills   RYBELSUS 3 MG TABS [Pharmacy Med Name: RYBELSUS 3 MG TABLET] 30 tablet 0    Sig: TAKE 1 TABLET BY MOUTH EVERY DAY     Off-Protocol Failed - 10/09/2021  2:01 AM      Failed - Medication not assigned to a protocol, review manually.      Passed - Valid encounter within last 12 months    Recent Outpatient Visits           2 weeks ago Type 2 diabetes mellitus without complication, without long-term current use of insulin ([place])   [facility] [place], [place], PA-C   3 months ago Acute pansinusitis, recurrence not specified   [place], PA-C   4 months ago Fall, subsequent encounter   [employer], DO   8 months ago Depression, major, recurrent, moderate [employer])   [facility] Gwyneth Sprout, FNP   1 year ago Viral URI with cough   [place], Vickki Muff, PA-C       Future Appointments             In 2 weeks [place], Letitia Libra, PA-C [employer], [place]               Requested Prescriptions  Pending Prescriptions Disp Refills   RYBELSUS 3 MG TABS [Pharmacy Med Name: RYBELSUS 3 MG TABLET] 30 tablet 0    Sig: TAKE 1 TABLET BY MOUTH EVERY DAY     Off-Protocol Failed - 10/09/2021  2:01 AM      Failed - Medication not assigned to a protocol, review manually.      Passed - Valid encounter within last 12 months    Recent Outpatient Visits           2 weeks ago Type 2 diabetes mellitus without complication, without long-term current use of insulin ([place])   [facility] [place], [place], PA-C   3 months ago Acute pansinusitis,  recurrence not specified   [place], PA-C   4 months ago Fall, subsequent encounter   [employer], DO   8 months ago Depression, major, recurrent, moderate [facility])   [facility] Gwyneth Sprout, FNP   1 year ago Viral URI with cough   [place], PA-C       Future Appointments             In 2 weeks [name], PA-C [employer], [place]

## 2021-10-12 NOTE — Telephone Encounter (Signed)
Appt in 2 weeks

## 2021-10-13 ENCOUNTER — Other Ambulatory Visit: Payer: Self-pay | Admitting: Family Medicine

## 2021-10-13 DIAGNOSIS — M791 Myalgia, unspecified site: Secondary | ICD-10-CM | POA: Diagnosis not present

## 2021-10-13 DIAGNOSIS — M542 Cervicalgia: Secondary | ICD-10-CM | POA: Diagnosis not present

## 2021-10-13 DIAGNOSIS — G43719 Chronic migraine without aura, intractable, without status migrainosus: Secondary | ICD-10-CM | POA: Diagnosis not present

## 2021-10-13 DIAGNOSIS — E119 Type 2 diabetes mellitus without complications: Secondary | ICD-10-CM

## 2021-10-20 DIAGNOSIS — H18603 Keratoconus, unspecified, bilateral: Secondary | ICD-10-CM | POA: Diagnosis not present

## 2021-10-20 DIAGNOSIS — E113299 Type 2 diabetes mellitus with mild nonproliferative diabetic retinopathy without macular edema, unspecified eye: Secondary | ICD-10-CM | POA: Diagnosis not present

## 2021-10-20 DIAGNOSIS — H47233 Glaucomatous optic atrophy, bilateral: Secondary | ICD-10-CM | POA: Diagnosis not present

## 2021-10-21 ENCOUNTER — Other Ambulatory Visit: Payer: Self-pay | Admitting: Physician Assistant

## 2021-10-21 ENCOUNTER — Telehealth: Payer: Self-pay

## 2021-10-21 DIAGNOSIS — E119 Type 2 diabetes mellitus without complications: Secondary | ICD-10-CM

## 2021-10-21 MED ORDER — RYBELSUS 3 MG PO TABS: 1.0000 | ORAL_TABLET | Freq: Every day | ORAL | 0 refills | Status: DC

## 2021-10-21 NOTE — Progress Notes (Unsigned)
Please, let know pt that rybelsus was sent to her pharmacy

## 2021-10-21 NOTE — Telephone Encounter (Signed)
I do not see any annotation/diagnosis in Abigail Figueroa's note in reference to bladder leakage, incontinence, etc.  Pt advised she can address with Abigail Figueroa at her next upcoming appt on 10-27-21.  Explained we will need a diagnosis in order to send note to medical supply for her disposable underwear. Pt agreeable to this.   Also pt states she has only 3 pills of Rybelsus left with no refills and will run off before appt.  Please advise.

## 2021-10-21 NOTE — Telephone Encounter (Signed)
Copied from [place] [id number]. Topic: General - Inquiry >> Oct 21, 2021 10:54 AM Erskine Squibb wrote: Reason for CRM: Patient called in stating she spoke with Marcene Brawn at [facility] in [place] about needing disposable underwear The patient statesi her provider is aware she needs these. Marcene Brawn from [place] states she needs something from the providers office indicating she does in fact need these. The number to [facility]as is [phone]. Please assist patient further.

## 2021-10-21 NOTE — Progress Notes (Signed)
Pt informed

## 2021-10-23 ENCOUNTER — Other Ambulatory Visit: Payer: Self-pay | Admitting: Family Medicine

## 2021-10-23 DIAGNOSIS — F331 Major depressive disorder, recurrent, moderate: Secondary | ICD-10-CM

## 2021-10-26 ENCOUNTER — Other Ambulatory Visit: Payer: Self-pay | Admitting: Cardiovascular Disease

## 2021-10-27 ENCOUNTER — Encounter: Payer: Self-pay | Admitting: Physician Assistant

## 2021-10-27 ENCOUNTER — Ambulatory Visit (INDEPENDENT_AMBULATORY_CARE_PROVIDER_SITE_OTHER): Payer: Medicare Other | Admitting: Physician Assistant

## 2021-10-27 VITALS — BP 136/73 | HR 71 | Temp 98.1°F | Resp 16 | Wt 207.1 lb

## 2021-10-27 DIAGNOSIS — F411 Generalized anxiety disorder: Secondary | ICD-10-CM

## 2021-10-27 DIAGNOSIS — E119 Type 2 diabetes mellitus without complications: Secondary | ICD-10-CM | POA: Diagnosis not present

## 2021-10-27 DIAGNOSIS — E669 Obesity, unspecified: Secondary | ICD-10-CM | POA: Diagnosis not present

## 2021-10-27 DIAGNOSIS — R600 Localized edema: Secondary | ICD-10-CM | POA: Diagnosis not present

## 2021-10-27 DIAGNOSIS — N3941 Urge incontinence: Secondary | ICD-10-CM

## 2021-10-27 LAB — POCT GLYCOSYLATED HEMOGLOBIN (HGB A1C)
Est. average glucose Bld gHb Est-mCnc: 197
Hemoglobin A1C: 8.5 % — AB (ref 4.0–5.6)

## 2021-10-27 NOTE — Progress Notes (Signed)
I,Cadan Maggart Robinson,acting as a Education administrator for [employer], PA-C.,have documented all relevant documentation on the behalf of Abigail Speak, PA-C,as directed by  [employer], PA-C while in the presence of [employer], PA-C.    Established patient visit   Patient: Abigail Figueroa   DOB: 08-29-1960   61 y.o. Female  MRN: [id number] Visit Date: 10/27/2021  Today's healthcare provider: [name], PA-C   Chief Complaint  Patient presents with   Diabetes   Anxiety       Edema   Subjective    Diabetes Mellitus Type II, Follow-up  Lab Results  Component Value Date   HGBA1C 9.2 (A) 09/22/2021   HGBA1C 8.4 (H) 08/15/2020   HGBA1C 8.6 (H) 02/05/2020   Wt Readings from Last 3 Encounters:  10/27/21 207 lb 1.6 oz (93.9 kg)  09/22/21 210 lb (95.3 kg)  06/19/21 213 lb (96.6 kg)   Last seen for diabetes 2 months ago.  Management since then includes A1C was 9.2 uncontrolled - Start semaglutide 3 MG TABS; Take 1 tablet by mouth daily. Patient refused to take insulin or semaglutide injections, or to take glipizide. Continue metformin 2500 mg daily  Strongly encouraged to adhere to low sugar diet. Patient is trying.  She reports fair compliance with treatment. She is not having side effects.Symptoms: No fatigue No foot ulcerations  No appetite changes No nausea  No paresthesia of the feet  No polydipsia  No polyuria No visual disturbances   No vomiting     Home blood sugar records: fasting range: started and then stopped  doing readings   Episodes of hypoglycemia? No   Current insulin regiment: Current exercise: walking Current diet habits: between healthy and unhealthy   Pertinent Labs: Lab Results  Component Value Date   CHOL 165 08/15/2020   HDL 76 08/15/2020   LDLCALC 71 08/15/2020   TRIG 100 08/15/2020   CHOLHDL 2.2 08/15/2020   Lab Results  Component Value Date   NA 141 01/09/2021   K 5.2 01/09/2021   CREATININE 1.26 (H) 01/09/2021   EGFR 49 (L) 01/09/2021    MICROALBUR 37.6 (H) 02/27/2019     --------------------------------------------------------------------------------------------------- Follow up for generalized anxiety disorder   The patient was last seen for this 2 months ago. Changes made at last visit include Continue BuSpar 15 mg GAD-7 will assess at the next visit.    She reports excellent compliance with treatment. She feels that condition is Improved. She is not having side effects.  Patient prefers not to discuss today.  Feels she is doing fine.  -----------------------------------------------------------------------------------------  Follow up for lower extremity edema  The patient was last seen for this 2 months ago. Changes made at last visit include .Increase Lasix from 40 mg to 60 mg daily Patient has not increased.  She reports poor compliance with treatment. She feels that condition is Improved. She is not having side effects.   Endorses having not able to get to bathroom on time. She has been wearing disposable incontinence pull-on underwear. Reports increase in urinary frequency and urgency. She is out of supplies from [employer] and requests the refill. Per chart review, the last order was [facility] to [facility] for disposable underwear for urge incontinence on 08/29/19  Follow up for lipid/cholesterol She reports good compliance with treatment. She is not having side effects. She did not proceed with updating lipid panel on 09/22/21  Lipid Panel     Component Value Date/Time   CHOL 165 08/15/2020  1147   CHOL 277 (H) 08/16/2014 0843   TRIG 100 08/15/2020 1147   TRIG 167 (H) 08/16/2014 0843   HDL 76 08/15/2020 1147   HDL 77 08/16/2014 0843   CHOLHDL 2.2 08/15/2020 1147   CHOLHDL 2.8 02/27/2019 0818   VLDL 22 02/27/2019 0818   VLDL 33 08/16/2014 0843   LDLCALC 71 08/15/2020 1147   LDLCALC 190 (H) 02/28/2017 1009   LDLCALC 167 (H) 08/16/2014 0843   LABVLDL 18 08/15/2020 1147     -----------------------------------------------------------------------------------------  Medications: Outpatient Medications Prior to Visit  Medication Sig   amLODipine (NORVASC) 5 MG tablet TAKE 1 AND 1/2 TABLETS BY MOUTH EVERY DAY   Azelastine HCl 0.15 % SOLN Place 2 sprays into both nostrils 2 (two) times daily.   baclofen (LIORESAL) 10 MG tablet Take 10 mg by mouth 2 (two) times daily as needed for muscle spasms.    BOTOX 100 UNITS SOLR injection Inject into the muscle every 3 (three) months.    busPIRone (BUSPAR) 15 MG tablet TAKE 1 TABLET BY MOUTH 2 TIMES DAILY.   clindamycin (CLEOCIN T) 1 % lotion APPLY TOPICALLY ONCE A DAY TO AREAS OF ACNE ON THE FACE   diclofenac Sodium (VOLTAREN) 1 % GEL Apply 4 g topically 4 (four) times daily.   EMGALITY 120 MG/ML SOAJ Inject into the skin as needed.   EPINEPHrine 0.3 mg/0.3 mL IJ SOAJ injection Inject 0.3 mLs (0.3 mg total) into the muscle as needed for up to 1 dose for anaphylaxis (Call 911 if used.).   ezetimibe (ZETIA) 10 MG tablet TAKE 1 TABLET (10 MG TOTAL) BY MOUTH DAILY. PLEASE SCHEDULE APPOINTMENT WITH DR. Claiborne Billings FOR REFILLS.   fluocinonide ointment (LIDEX) 3.23 % Apply 1 application topically 2 (two) times daily.   fluticasone (FLONASE) 50 MCG/ACT nasal spray Place 2 sprays into both nostrils in the morning and at bedtime.   furosemide (LASIX) 20 MG tablet TAKE 1 TABLET BY MOUTH EVERY DAY   glucose blood (ONETOUCH VERIO) test strip TEST FASTING GLUCOSE DAILY AS DIRECTED   hydrocortisone 1 % ointment Apply 1 application topically 2 (two) times daily. To areas of itching not to use if any blisters or open wounds. USE PRN itching.   lamoTRIgine (LAMICTAL) 100 MG tablet Take 100 mg by mouth 2 (two) times daily.   Lancets (ONETOUCH ULTRASOFT) lancets Test fasting each morning and 2 hours before supper. Retest if having hypoglycemic symptoms.   levocetirizine (XYZAL) 5 MG tablet TAKE 1 TABLET BY MOUTH EVERY DAY AS NEEDED   metFORMIN  (GLUCOPHAGE) 500 MG tablet TAKE 2 TABLETS BY MOUTH IN THE MORNING ,1 TABLET AT LUNCH, & TAKE 2 TABLETS IN THE EVENING   metoprolol tartrate (LOPRESSOR) 100 MG tablet TAKE 1 TABLET (100 MG TOTAL) BY MOUTH 2 (TWO) TIMES DAILY. KEEP OFFICE VISIT   promethazine (PHENERGAN) 25 MG tablet Take 25 mg by mouth every 4 (four) hours as needed.   rosuvastatin (CRESTOR) 20 MG tablet TAKE 1 TABLET BY MOUTH EVERY DAY   Semaglutide (RYBELSUS) 3 MG TABS Take 1 tablet by mouth daily.   TEGRETOL-XR 400 MG 12 hr tablet TAKE 1 TABLET BY MOUTH TWICE A DAY   Vilazodone HCl (VIIBRYD) 40 MG TABS TAKE 1 TABLET BY MOUTH EVERY DAY   No facility-administered medications prior to visit.    Review of Systems  Respiratory: Negative.    Cardiovascular: Negative.   Gastrointestinal: Negative.   Genitourinary:  Positive for frequency and urgency.       Objective  BP 136/73 (BP Location: Left Arm, Patient Position: Sitting, Cuff Size: Normal)   Pulse 71   Temp 98.1 F (36.7 C) (Oral)   Resp 16   Wt 207 lb 1.6 oz (93.9 kg)   LMP  (LMP Unknown)   SpO2 100%   BMI 36.69 kg/m    Physical exam Vitals reviewed.     General: She is not in acute distress.    Appearance: Normal appearance. She is well-developed. She is not diaphoretic. She is obese HENT:     Head: Normocephalic and atraumatic.     Nose: Nose normal.  Eyes:     General: No scleral icterus.    Extraocular Movements: Extraocular movements intact.     Conjunctiva/sclera: Conjunctivae normal.  Neck:     Thyroid: No thyromegaly.  Cardiovascular:     Rate and Rhythm: Normal rate and regular rhythm.     Pulses: Normal pulses.     Heart sounds: Normal heart sounds. No murmur heard. Pulmonary:     Effort: Pulmonary effort is normal. No respiratory distress.     Breath sounds: Normal breath sounds. No wheezing or rales.  Musculoskeletal:        General: No deformity. Normal range of motion.     Cervical back: Normal range of motion and neck supple.      Right lower leg: Edema/ankle , mild, chronic    Left lower leg: Edema/ankle, mild, chronic  Skin:    General: Skin is warm.     Findings: No rash.  Neurological:     Mental Status: She is alert and oriented to person, place, and time. Mental status is at baseline.     Sensory: No sensory deficit.     Motor: No weakness.     Gait: Gait normal.  Psychiatric:        Behavior: Behavior normal.        Thought Content: Thought content normal.        Judgment: Judgment normal.   No results found for any visits on 10/27/21.  Assessment & Plan     1. Type 2 diabetes mellitus without complication, without long-term current use of insulin (HCC) Still tolerating Metformin 2500 mg and Rybelsus 3 mg. Might try to increase the dose to 6/ 28m if pt agrees. Last Hgb A1C was 9.2% No hypoglycemic episodes, blurring of vision, - POCT glycosylated hemoglobin (Hb A1C) 8.5 vs >9.2 on 09/22/21 - Urine microalbumin-creatinine with uACR - CBC with Differential/Platelet - Comprehensive metabolic panel - TSH - Lipid panel  2. Obesity (BMI 30-39.9) BMI 36.69 - CBC with Differential/Platelet - Comprehensive metabolic panel - TSH - Lipid panel Lifestyle modifications of losing up to 5% of weight loss advised  3. Lower extremity edema Continue Lasix, advised to increased to 631m 4. Generalized anxiety disorder Continue her current medication  5. Urge incontinence Mixed with stress incontinence Still having urinary incontinence and leakage with stress or urge to urinate. Must wear disposable pull-on underwear to prevent catch any wetting. Changes pull-ons 2-3 times a day to prevent wetness causing skin breakdown, yeast or decubitus formation. Medically necessary to continue incontinence pull-ons.  6. Hypercholesterolemia Tolerating Crestor without side effect. Recheck labs to assess control. Encouraged to follow low fat diet, lose weight and walk for exercise.  She was not able to do labs on  09/22/21 and requested to proceed with labs today.  Fu in 6 weeks for DM fu The patient was advised to call back or seek an in-person evaluation  if the symptoms worsen or if the condition fails to improve as anticipated.  I discussed the assessment and treatment plan with the patient. The patient was provided an opportunity to ask questions and all were answered. The patient agreed with the plan and demonstrated an understanding of the instructions.  The entirety of the information documented in the History of Present Illness, Review of Systems and Physical Exam were personally obtained by me. Portions of this information were initially documented by the CMA and reviewed by me for thoroughness and accuracy.  Portions of this note were created using dictation software and may contain typographical errors.      Total encounter time more than 30 minutes Greater than 50% was spent in counseling and coordination of care with the patient   Abigail Figueroa  [facility] [phone] (phone) [phone] (fax)  [place]

## 2021-10-28 LAB — MICROALBUMIN / CREATININE URINE RATIO
Creatinine, Urine: 19.7 mg/dL
Microalb/Creat Ratio: 17 mg/g creat (ref 0–29)
Microalbumin, Urine: 3.4 ug/mL

## 2021-10-28 NOTE — Telephone Encounter (Signed)
Order form and office note faxed 10-28-21

## 2021-10-28 NOTE — Telephone Encounter (Addendum)
Patient spoke with [facility] today and they advised they have not received the large disposable underwear orders. Patient states she was seen on 10/27/2021 and discussed this at her appointment. Patient would like a follow up call when orders have been faxed.

## 2021-10-30 ENCOUNTER — Other Ambulatory Visit: Payer: Self-pay | Admitting: Physician Assistant

## 2021-10-30 ENCOUNTER — Telehealth: Payer: Self-pay

## 2021-10-30 NOTE — Telephone Encounter (Signed)
This encounter was created in error - please disregard.

## 2021-10-30 NOTE — Telephone Encounter (Signed)
Called pt and advised her that she is overdue blood work for a Tegretol level. Order is still in chart from march and advised she does not need appt. Pt stated she will go Monday 11/02/21. Pt stated she has enough medication to last to refill.    4 weeks ago (10/01/2021)  TEGRETOL-XR 400 MG 12 hr tablet  TAKE 1 TABLET BY MOUTH TWICE A DAY  Dispense: 60 tablet     Refills: 0     Start: 10/01/2021      By:  [name], PA-C      Encounter  Notes to pharmacy: Please tell patient she is to have her level checked for further refills.  Lab was ordered in March

## 2021-10-30 NOTE — Telephone Encounter (Signed)
Called pt and advised her that she is overdue blood work for a Tegretol level. Order is still in chart from march and advised she does not need appt. Pt stated she will go Monday 11/02/21. Pt stated she has enough medication to last to refill.   4 weeks ago (10/01/2021)  TEGRETOL-XR 400 MG 12 hr tablet  TAKE 1 TABLET BY MOUTH TWICE A DAY  Dispense: 60 tablet     Refills: 0     Start: 10/01/2021      By:  [name], PA-C      Encounter  Notes to pharmacy: Please tell patient she is to have her level checked for further refills.  Lab was ordered in March

## 2021-10-30 NOTE — Addendum Note (Signed)
Addended by: Carlisle Beers on: 10/30/2021 02:57 PM   Modules accepted: Orders, Level of Service

## 2021-11-05 DIAGNOSIS — E119 Type 2 diabetes mellitus without complications: Secondary | ICD-10-CM | POA: Diagnosis not present

## 2021-11-06 LAB — LIPID PANEL
Chol/HDL Ratio: 1.8 ratio (ref 0.0–4.4)
Cholesterol, Total: 132 mg/dL (ref 100–199)
HDL: 73 mg/dL (ref >39)
LDL Chol Calc (NIH): 41 mg/dL (ref 0–99)
Triglycerides: 100 mg/dL (ref 0–149)
VLDL Cholesterol Cal: 18 mg/dL (ref 5–40)

## 2021-11-06 LAB — COMPREHENSIVE METABOLIC PANEL
ALT: 17 IU/L (ref 0–32)
AST: 17 IU/L (ref 0–40)
Albumin/Globulin Ratio: 1.4 (ref 1.2–2.2)
Albumin: 4.3 g/dL (ref 3.8–4.8)
Alkaline Phosphatase: 68 IU/L (ref 44–121)
BUN/Creatinine Ratio: 14 (ref 12–28)
BUN: 21 mg/dL (ref 8–27)
Bilirubin Total: <0.2 mg/dL (ref 0.0–1.2)
CO2: 22 mmol/L (ref 20–29)
Calcium: 10.2 mg/dL (ref 8.7–10.3)
Chloride: 103 mmol/L (ref 96–106)
Creatinine, Ser: 1.51 mg/dL — ABNORMAL HIGH (ref 0.57–1.00)
Globulin, Total: 3 g/dL (ref 1.5–4.5)
Glucose: 138 mg/dL — ABNORMAL HIGH (ref 70–99)
Potassium: 5.4 mmol/L — ABNORMAL HIGH (ref 3.5–5.2)
Sodium: 142 mmol/L (ref 134–144)
Total Protein: 7.3 g/dL (ref 6.0–8.5)
eGFR: 39 mL/min/{1.73_m2} — ABNORMAL LOW (ref >59)

## 2021-11-06 LAB — CBC WITH DIFFERENTIAL/PLATELET
Basophils Absolute: 0.1 10*3/uL (ref 0.0–0.2)
Basos: 1 %
EOS (ABSOLUTE): 0.2 10*3/uL (ref 0.0–0.4)
Eos: 4 %
Hematocrit: 31.5 % — ABNORMAL LOW (ref 34.0–46.6)
Hemoglobin: 9.9 g/dL — ABNORMAL LOW (ref 11.1–15.9)
Immature Grans (Abs): 0 10*3/uL (ref 0.0–0.1)
Immature Granulocytes: 0 %
Lymphocytes Absolute: 2.1 10*3/uL (ref 0.7–3.1)
Lymphs: 38 %
MCH: 26.8 pg (ref 26.6–33.0)
MCHC: 31.4 g/dL — ABNORMAL LOW (ref 31.5–35.7)
MCV: 85 fL (ref 79–97)
Monocytes Absolute: 0.3 10*3/uL (ref 0.1–0.9)
Monocytes: 5 %
Neutrophils Absolute: 2.9 10*3/uL (ref 1.4–7.0)
Neutrophils: 52 %
Platelets: 244 10*3/uL (ref 150–450)
RBC: 3.69 x10E6/uL — ABNORMAL LOW (ref 3.77–5.28)
RDW: 14.3 % (ref 11.7–15.4)
WBC: 5.5 10*3/uL (ref 3.4–10.8)

## 2021-11-06 LAB — TSH: TSH: 2.56 u[IU]/mL (ref 0.450–4.500)

## 2021-11-06 NOTE — Progress Notes (Signed)
Hello Abigail Figueroa ,   Your labwork results all are WNL or  stable for you Except her hemoglobin and red blood cells levels are low which is indicative of anemia. You might try some iron-enriched diet.  Your kidney function decreased. Reviewed labs results over the last 2 years and your renal functions have been very consistent. You need to drink 8-12 glasses of water every day, avoid NSAIDs, and have renal functions check avery 6-12 months to ensure stability.  You could discuss the rest at your fu app  Any questions please reach out to the office or message me on MyChart! Best,  [name], PA-C

## 2021-11-07 ENCOUNTER — Other Ambulatory Visit: Payer: Self-pay | Admitting: Physician Assistant

## 2021-11-07 DIAGNOSIS — G40909 Epilepsy, unspecified, not intractable, without status epilepticus: Secondary | ICD-10-CM

## 2021-11-07 MED ORDER — CARBAMAZEPINE ER 400 MG PO TB12: 400.0000 mg | ORAL_TABLET | Freq: Two times a day (BID) | ORAL | 0 refills | Status: DC

## 2021-11-07 NOTE — Progress Notes (Signed)
The tegretol level results from 11/02/21 ( per pt correspondence from 10/30/21) were not found. We did not address the med refill during her recent appt on 10/27/21. Pt will be informed that we could not refill her medication next time without tegretol level.

## 2021-11-12 DIAGNOSIS — G43719 Chronic migraine without aura, intractable, without status migrainosus: Secondary | ICD-10-CM | POA: Diagnosis not present

## 2021-11-12 DIAGNOSIS — M542 Cervicalgia: Secondary | ICD-10-CM | POA: Diagnosis not present

## 2021-11-20 ENCOUNTER — Other Ambulatory Visit: Payer: Self-pay | Admitting: Cardiovascular Disease

## 2021-11-26 ENCOUNTER — Ambulatory Visit (INDEPENDENT_AMBULATORY_CARE_PROVIDER_SITE_OTHER): Payer: Medicare Other

## 2021-11-26 VITALS — Wt 207.0 lb

## 2021-11-26 DIAGNOSIS — Z5982 Transportation insecurity: Secondary | ICD-10-CM | POA: Diagnosis not present

## 2021-11-26 DIAGNOSIS — Z0000 Encounter for general adult medical examination without abnormal findings: Secondary | ICD-10-CM | POA: Diagnosis not present

## 2021-11-26 NOTE — Progress Notes (Signed)
Virtual Visit via Telephone Note  I connected with  [name] on 11/26/21 at  9:30 AM EDT by telephone and verified that I am speaking with the correct person using two identifiers.  Location: Patient: home Provider: [facility] Persons participating in the virtual visit: [place]   I discussed the limitations, risks, security and privacy concerns of performing an evaluation and management service by telephone and the availability of in person appointments. The patient expressed understanding and agreed to proceed.  Interactive audio and video telecommunications were attempted between this nurse and patient, however failed, due to patient having technical difficulties OR patient did not have access to video capability.  We continued and completed visit with audio only.  Some vital signs may be absent or patient reported.   Abigail David, LPN  Subjective:   Abigail Figueroa is a 61 y.o. female who presents for Medicare Annual (Subsequent) preventive examination.  Review of Systems           Objective:    There were no vitals filed for this visit. There is no height or weight on file to calculate BMI.     02/21/2020    3:27 PM 03/22/2019    1:33 PM 12/12/2018   10:14 AM 11/08/2017    8:42 PM 08/30/2017   12:53 PM 07/15/2017   10:26 AM 11/11/2016    2:48 PM  Advanced Directives  Does Patient Have a Medical Advance Directive? Yes No Yes Yes Yes Yes   Type of Paramedic of [place];Living will  [place];Living will Living will Living will [place];Living will   Does patient want to make changes to medical advance directive?    No - Patient declined     Copy of [employer] in Chart? No - copy requested  No - copy requested   No - copy requested   Would patient like information on creating a medical advance directive?  No - Patient declined          Information is confidential  and restricted. Go to Review Flowsheets to unlock data.    Current Medications (verified) Outpatient Encounter Medications as of 11/26/2021  Medication Sig   amLODipine (NORVASC) 5 MG tablet TAKE 1 AND 1/2 TABLETS BY MOUTH EVERY DAY   Azelastine HCl 0.15 % SOLN Place 2 sprays into both nostrils 2 (two) times daily.   baclofen (LIORESAL) 10 MG tablet Take 10 mg by mouth 2 (two) times daily as needed for muscle spasms.    BOTOX 100 UNITS SOLR injection Inject into the muscle every 3 (three) months.    busPIRone (BUSPAR) 15 MG tablet TAKE 1 TABLET BY MOUTH 2 TIMES DAILY.   carbamazepine (TEGRETOL-XR) 400 MG 12 hr tablet Take 1 tablet (400 mg total) by mouth 2 (two) times daily.   chlorproMAZINE (THORAZINE) 25 MG tablet Take 50 mg by mouth 2 (two) times daily.   clindamycin (CLEOCIN T) 1 % lotion APPLY TOPICALLY ONCE A DAY TO AREAS OF ACNE ON THE FACE   diclofenac Sodium (VOLTAREN) 1 % GEL Apply 4 g topically 4 (four) times daily.   EMGALITY 120 MG/ML SOAJ Inject into the skin as needed.   EPINEPHrine 0.3 mg/0.3 mL IJ SOAJ injection Inject 0.3 mLs (0.3 mg total) into the muscle as needed for up to 1 dose for anaphylaxis (Call 911 if used.).   ezetimibe (ZETIA) 10 MG tablet TAKE 1 TABLET (10 MG TOTAL) BY MOUTH  DAILY. PLEASE SCHEDULE APPOINTMENT WITH DR. Claiborne Billings FOR REFILLS.   fluocinonide ointment (LIDEX) 8.92 % Apply 1 application topically 2 (two) times daily.   fluticasone (FLONASE) 50 MCG/ACT nasal spray Place 2 sprays into both nostrils in the morning and at bedtime.   furosemide (LASIX) 20 MG tablet TAKE 1 TABLET BY MOUTH EVERY DAY   glucose blood (ONETOUCH VERIO) test strip TEST FASTING GLUCOSE DAILY AS DIRECTED   hydrocortisone 1 % ointment Apply 1 application topically 2 (two) times daily. To areas of itching not to use if any blisters or open wounds. USE PRN itching.   lamoTRIgine (LAMICTAL) 100 MG tablet Take 100 mg by mouth 2 (two) times daily.   Lancets (ONETOUCH ULTRASOFT) lancets Test  fasting each morning and 2 hours before supper. Retest if having hypoglycemic symptoms.   levocetirizine (XYZAL) 5 MG tablet TAKE 1 TABLET BY MOUTH EVERY DAY AS NEEDED   metFORMIN (GLUCOPHAGE) 500 MG tablet TAKE 2 TABLETS BY MOUTH IN THE MORNING ,1 TABLET AT LUNCH, & TAKE 2 TABLETS IN THE EVENING   metoprolol tartrate (LOPRESSOR) 100 MG tablet Take 1 tablet (100 mg total) by mouth 2 (two) times daily.   promethazine (PHENERGAN) 25 MG tablet Take 25 mg by mouth every 4 (four) hours as needed.   rosuvastatin (CRESTOR) 20 MG tablet TAKE 1 TABLET BY MOUTH EVERY DAY   Semaglutide (RYBELSUS) 3 MG TABS Take 1 tablet by mouth daily.   Vilazodone HCl (VIIBRYD) 40 MG TABS TAKE 1 TABLET BY MOUTH EVERY DAY   No facility-administered encounter medications on file as of 11/26/2021.    Allergies (verified) Morphine, Morphine and related, Oxycodone, Oxycodone hcl, Aimovig [erenumab-aooe], Augmentin [amoxicillin-pot clavulanate], and Morphine sulfate   History: Past Medical History:  Diagnosis Date   Abnormal stress test    a. 10/2009: Normal myocardial perfusion imaging; b. 08/2015 MV: medium defect of mod severity in mid ant apical region w/ mild HK of the distal inf wall;  c. 08/2015 Cath: nl Cors, EF 60%.   Anemia    Angio-edema    Anxiety    Arthritis    Bell's palsy    Depression    Edema    Essential hypertension    Frequent urination    Frequent urination at night    Headache    migraines, gets botox injections   Heart palpitations June 2013   Event monitor showing sinus tachycardia, PACs with couplets and triplets.   Hx of echocardiogram    a. 10/2009 Echo: showed normal left ventricular function, mild left ventricular hypertrophy, and no significant valve abnormalities.   Hypercholesterolemia    Migraine    Morbid obesity ([place])    Neuromuscular disorder (HCC)    OSA (obstructive sleep apnea)    has been on continuous positive airway pressure   Seizure disorder ([place])    Seizures ([place])     Type II diabetes mellitus ([place])    Urticaria    Past Surgical History:  Procedure Laterality Date   ABDOMINAL HYSTERECTOMY  1990   without BSO   BACK SURGERY  1900s 2001   x2 with "cage put in"   [place] N/A 08/06/2015   Procedure: Left Heart Cath and Coronary Angiography;  Surgeon: Troy Sine, MD;  Location: [facility];  Service: Cardiovascular;  Laterality: N/A;   CARPAL TUNNEL RELEASE Bilateral    CHOLECYSTECTOMY  1980   COLONOSCOPY WITH PROPOFOL N/A 08/30/2017   Procedure: COLONOSCOPY WITH PROPOFOL;  Surgeon: Lin Landsman, MD;  Location: [facility] END[facility]COPY;  Service: Endoscopy;  Laterality: N/A;   DG THUMB LEFT HAND  01/24/2018   repair joint in left thumb   DORSAL COMPARTMENT RELEASE Left 10/25/2014   Procedure: LEFT FIRST  DORSAL COMPARTMENT RELEASE AND RADIAL TENOSYNOVECTOMY ;  Surgeon: Roseanne Kaufman, MD;  Location: [facility];  Service: Orthopedics;  Laterality: Left;   HAND SURGERY     KNEE SURGERY Right 2012   meniscus tear   KNEE SURGERY  2012   LAMINECTOMY  1995   TUBAL LIGATION  1982   WRIST SURGERY Right    Family History  Problem Relation Age of Onset   Sarcoidosis Mother    Seizures Mother    Heart attack Mother    Alzheimer's disease Maternal Grandmother    Dementia Maternal Grandmother    Hypertension Maternal Grandfather    Diabetes Maternal Grandfather    Breast cancer Neg Hx    Allergic rhinitis Neg Hx    Asthma Neg Hx    Eczema Neg Hx    Urticaria Neg Hx    Social History   Socioeconomic History   Marital status: Married    Spouse name: Not on file   Number of children: 2   Years of education: Not on file   Highest education level: 12th grade  Occupational History   Occupation: disabled  Tobacco Use   Smoking status: Former    Types: Cigarettes    Quit date: 01/03/1983    Years since quitting: 38.9   Smokeless tobacco: Never   Tobacco comments:    quit in  1984  Vaping Use   Vaping Use: Never used  Substance and Sexual Activity   Alcohol use: No    Alcohol/week: 0.0 standard drinks of alcohol   Drug use: No   Sexual activity: Never  Other Topics Concern   Not on file  Social History Narrative   ** Merged History Encounter **       She is a married mother of 2, grandmother 3. She tries to get exercise but is not doing any routine program.   She quit smoking over 25 years ago does not drink alcohol.   Social Determinants of Health   Financial Resource Strain: Medium Risk (04/08/2020)   Overall Financial Resource Strain (CARDIA)    Difficulty of Paying Living Expenses: Somewhat hard  Food Insecurity: No Food Insecurity (04/08/2020)   Hunger Vital Sign    Worried About Running Out of Food in the Last Year: Never true    Ran Out of Food in the Last Year: Never true  Transportation Needs: No Transportation Needs (04/08/2020)   PRAPARE - Hydrologist (Medical): No    Lack of Transportation (Non-Medical): No  Physical Activity: Inactive (02/21/2020)   Exercise Vital Sign    Days of Exercise per Week: 0 days    Minutes of Exercise per Session: 0 min  Stress: Stress Concern Present (02/21/2020)   [place]    Feeling of Stress : To some extent  Social Connections: Moderately Integrated (02/21/2020)   Social Connection and Isolation Panel [NHANES]    Frequency of Communication with Friends and Family: More than three times a week    Frequency of Social Gatherings with Friends and Family: More than three times a week    Attends Religious Services: Never    Marine scientist or Organizations: Yes  Attends Music therapist: More than 4 times per year    Marital Status: Married    Tobacco Counseling Counseling given: Not Answered Tobacco comments: quit in 1984   Clinical Intake:  Pre-visit preparation completed:  Yes  Pain : No/denies pain     Nutritional Risks: None Diabetes: Yes CBG done?: No Did pt. bring in CBG monitor from home?: No  How often do you need to have someone help you when you read instructions, pamphlets, or other written materials from your doctor or pharmacy?: 1 - Never  Diabetic?yes Nutrition Risk Assessment:  Has the patient had any N/V/D within the last 2 months?  No  Does the patient have any non-healing wounds?  No  Has the patient had any unintentional weight loss or weight gain?  No   Diabetes:  Is the patient diabetic?  Yes  If diabetic, was a CBG obtained today?  No  Did the patient bring in their glucometer from home?  No  How often do you monitor your CBG's? occasionally.   Financial Strains and Diabetes Management:  Are you having any financial strains with the device, your supplies or your medication? No .  Does the patient want to be seen by Chronic Care Management for management of their diabetes?  No  Would the patient like to be referred to a Nutritionist or for Diabetic Management?  No   Diabetic Exams:  Diabetic Eye Exam: Completed 12/02/20. Overdue for diabetic eye exam. Pt has been advised about the importance in completing this exam.  Diabetic Foot Exam: Completed 09/22/21. Pt has been advised about the importance in completing this exam.   Interpreter Needed?: No  Information entered by :: Kirke Shaggy, LPN   Activities of Daily Living    06/02/2021   11:50 AM 01/16/2021    1:35 PM  In your present state of health, do you have any difficulty performing the following activities:  Hearing? 1 1  Vision? 0 0  Difficulty concentrating or making decisions? 1 1  Walking or climbing stairs? 1 1  Dressing or bathing? 0 0  Doing errands, shopping? 1 1    Patient Care Team: [name], Hershal Coria as PCP - General (Physician Assistant) Troy Sine, MD as PCP - Cardiology (Cardiology) Orie Rout, MD as Referring Physician  (Specialist) Newman Pies, MD as Consulting Physician (Neurosurgery) Estill Cotta, MD as Consulting Physician (Ophthalmology) Ernst Bowler Gwenith Daily, MD as Consulting Physician (Allergy and Immunology)  Indicate any recent Medical Services you may have received from other than [facility] providers in the past year (date may be approximate).     Assessment:   This is a routine wellness examination for Abigail Figueroa.  Hearing/Vision screen No results found.  Dietary issues and exercise activities discussed:     Goals Addressed   None    Depression Screen    06/02/2021   11:49 AM 01/16/2021    1:34 PM 04/08/2020   11:29 AM 02/05/2020    9:40 AM 05/29/2019    3:23 PM 03/20/2019    1:45 PM 12/12/2018   10:15 AM  PHQ 2/9 Scores  PHQ - 2 Score '2 1 1 5 '$ 0  5  PHQ- 9 Score '8 4  10 '$ 0  11  Exception Documentation      Patient refusal     Fall Risk    06/02/2021   11:49 AM 01/16/2021    1:34 PM 04/08/2020    1:47 PM 02/21/2020    3:27 PM 02/05/2020  9:39 AM  Fall Risk   Falls in the past year? '1 1 1 1 1  '$ Number falls in past yr: '1 1 1 1 1  '$ Injury with Fall? 1 0 0 0 0  Risk for fall due to : History of fall(s) History of fall(s) History of fall(s);Medication side effect;Impaired balance/gait Impaired balance/gait   Follow up Falls evaluation completed  Falls prevention discussed Falls prevention discussed     FALL RISK PREVENTION PERTAINING TO THE HOME:  Any stairs in or around the home? No  If so, are there any without handrails? No  Home free of loose throw rugs in walkways, pet beds, electrical cords, etc? Yes  Adequate lighting in your home to reduce risk of falls? Yes   ASSISTIVE DEVICES UTILIZED TO PREVENT FALLS:  Life alert? No  Use of a cane, walker or w/c? No  Grab bars in the bathroom? Yes  Shower chair or bench in shower? No  Elevated toilet seat or a handicapped toilet? No     Cognitive Function:declined        02/21/2020    3:32 PM 12/12/2018   10:25 AM  07/20/2016    1:21 PM  6CIT Screen  What Year? 0 points 0 points 0 points  What month? 0 points 0 points 0 points  What time? 0 points 0 points 0 points  Count back from 20 0 points 0 points 0 points  Months in reverse 0 points 0 points 0 points  Repeat phrase 0 points 0 points 10 points  Total Score 0 points 0 points 10 points    Immunizations Immunization History  Administered Date(s) Administered   Influenza,inj,Quad PF,6+ Mos 02/28/2017, 01/17/2018, 02/06/2019, 02/05/2020   PFIZER(Purple Top)SARS-COV-2 Vaccination 07/20/2019, 08/10/2019   Pneumococcal Polysaccharide-23 01/17/2018   Tdap 10/20/2006    TDAP status: Due, Education has been provided regarding the importance of this vaccine. Advised may receive this vaccine at local pharmacy or Health Dept. Aware to provide a copy of the vaccination record if obtained from local pharmacy or Health Dept. Verbalized acceptance and understanding.  Flu Vaccine status: Declined, Education has been provided regarding the importance of this vaccine but patient still declined. Advised may receive this vaccine at local pharmacy or Health Dept. Aware to provide a copy of the vaccination record if obtained from local pharmacy or Health Dept. Verbalized acceptance and understanding.  Pneumococcal vaccine status: Due, Education has been provided regarding the importance of this vaccine. Advised may receive this vaccine at local pharmacy or Health Dept. Aware to provide a copy of the vaccination record if obtained from local pharmacy or Health Dept. Verbalized acceptance and understanding.  Covid-19 vaccine status: Completed vaccines  Qualifies for Shingles Vaccine? Yes   Zostavax completed No   Shingrix Completed?: No.    Education has been provided regarding the importance of this vaccine. Patient has been advised to call insurance company to determine out of pocket expense if they have not yet received this vaccine. Advised may also receive vaccine  at local pharmacy or Health Dept. Verbalized acceptance and understanding.  Screening Tests Health Maintenance  Topic Date Due   Zoster Vaccines- Shingrix (1 of 2) Never done   TETANUS/TDAP  10/19/2016   COVID-19 Vaccine (3 - Pfizer risk series) 09/07/2019   INFLUENZA VACCINE  12/01/2021   OPHTHALMOLOGY EXAM  12/02/2021   HEMOGLOBIN A1C  04/28/2022   COLONOSCOPY (Pts 45-100yr Insurance coverage will need to be confirmed)  08/31/2022   FOOT EXAM  09/23/2022  URINE MICROALBUMIN  10/28/2022   MAMMOGRAM  07/08/2023   Hepatitis C Screening  Completed   HIV Screening  Completed   HPV VACCINES  Aged Out    Health Maintenance  Health Maintenance Due  Topic Date Due   Zoster Vaccines- Shingrix (1 of 2) Never done   TETANUS/TDAP  10/19/2016   COVID-19 Vaccine (3 - Pfizer risk series) 09/07/2019    Colorectal cancer screening: Type of screening: Colonoscopy. Completed 08/30/17. Repeat every 5 years  Mammogram status: Completed 07/07/21. Repeat every year  Bone Density status: Completed 06/08/21. Results reflect: Bone density results: NORMAL. Repeat every 5 years.  Lung Cancer Screening: (Low Dose CT Chest recommended if Age 5-80 years, 30 pack-year currently smoking OR have quit w/in 15years.) does not qualify.   Additional Screening:  Hepatitis C Screening: does qualify; Completed 02/28/17  Vision Screening: Recommended annual ophthalmology exams for early detection of glaucoma and other disorders of the eye. Is the patient up to date with their annual eye exam?  Yes  Who is the provider or what is the name of the office in which the patient attends annual eye exams? Dr.  in [facility] If pt is not established with a provider, would they like to be referred to a provider to establish care? No .   Dental Screening: Recommended annual dental exams for proper oral hygiene  Community Resource Referral / Chronic Care Management: CRR required this visit?  No   CCM required this visit?  No       Plan:     I have personally reviewed and noted the following in the patient's chart:   Medical and social history Use of alcohol, tobacco or illicit drugs  Current medications and supplements including opioid prescriptions.  Functional ability and status Nutritional status Physical activity Advanced directives List of other physicians Hospitalizations, surgeries, and ER visits in previous 12 months Vitals Screenings to include cognitive, depression, and falls Referrals and appointments  In addition, I have reviewed and discussed with patient certain preventive protocols, quality metrics, and best practice recommendations. A written personalized care plan for preventive services as well as general preventive health recommendations were provided to patient.     Abigail David, LPN   08/12/8784   Nurse Notes: needs transportation, counseling/meds for depression- referrals sent

## 2021-11-26 NOTE — Patient Instructions (Signed)
Abigail Figueroa , Thank you for taking time to come for your Medicare Wellness Visit. I appreciate your ongoing commitment to your health goals. Please review the following plan we discussed and let me know if I can assist you in the future.   Screening recommendations/referrals: Colonoscopy: 08/30/17 Mammogram: 07/07/21 Bone Density: 06/08/21 Recommended yearly ophthalmology/optometry visit for glaucoma screening and checkup Recommended yearly dental visit for hygiene and checkup  Vaccinations: Influenza vaccine: n/d Pneumococcal vaccine:01/17/18 Tdap vaccine: 10/20/06 Shingles vaccine: n/d   Covid-19:07/20/19, 08/10/19  Advanced directives: no  Conditions/risks identified: none  Next appointment: Follow up in one year for your annual wellness visit 11/29/22 @ 10:30 am by phone   Preventive Care 65 Years and Older, Female Preventive care refers to lifestyle choices and visits with your health care provider that can promote health and wellness. What does preventive care include? A yearly physical exam. This is also called an annual well check. Dental exams once or twice a year. Routine eye exams. Ask your health care provider how often you should have your eyes checked. Personal lifestyle choices, including: Daily care of your teeth and gums. Regular physical activity. Eating a healthy diet. Avoiding tobacco and drug use. Limiting alcohol use. Practicing safe sex. Taking low-dose aspirin every day. Taking vitamin and mineral supplements as recommended by your health care provider. What happens during an annual well check? The services and screenings done by your health care provider during your annual well check will depend on your age, overall health, lifestyle risk factors, and family history of disease. Counseling  Your health care provider may ask you questions about your: Alcohol use. Tobacco use. Drug use. Emotional well-being. Home and relationship well-being. Sexual  activity. Eating habits. History of falls. Memory and ability to understand (cognition). Work and work Statistician. Reproductive health. Screening  You may have the following tests or measurements: Height, weight, and BMI. Blood pressure. Lipid and cholesterol levels. These may be checked every 5 years, or more frequently if you are over 55 years old. Skin check. Lung cancer screening. You may have this screening every year starting at age 28 if you have a 30-pack-year history of smoking and currently smoke or have quit within the past 15 years. Fecal occult blood test (FOBT) of the stool. You may have this test every year starting at age 22. Flexible sigmoidoscopy or colonoscopy. You may have a sigmoidoscopy every 5 years or a colonoscopy every 10 years starting at age 81. Hepatitis C blood test. Hepatitis B blood test. Sexually transmitted disease (STD) testing. Diabetes screening. This is done by checking your blood sugar (glucose) after you have not eaten for a while (fasting). You may have this done every 1-3 years. Bone density scan. This is done to screen for osteoporosis. You may have this done starting at age 56. Mammogram. This may be done every 1-2 years. Talk to your health care provider about how often you should have regular mammograms. Talk with your health care provider about your test results, treatment options, and if necessary, the need for more tests. Vaccines  Your health care provider may recommend certain vaccines, such as: Influenza vaccine. This is recommended every year. Tetanus, diphtheria, and acellular pertussis (Tdap, Td) vaccine. You may need a Td booster every 10 years. Zoster vaccine. You may need this after age 60. Pneumococcal 13-valent conjugate (PCV13) vaccine. One dose is recommended after age 67. Pneumococcal polysaccharide (PPSV23) vaccine. One dose is recommended after age 59. Talk to your health care provider about  which screenings and vaccines  you need and how often you need them. This information is not intended to replace advice given to you by your health care provider. Make sure you discuss any questions you have with your health care provider. Document Released: 05/16/2015 Document Revised: 01/07/2016 Document Reviewed: 02/18/2015 Elsevier Interactive Patient Education  2017 [employer] Prevention in the Home Falls can cause injuries. They can happen to people of all ages. There are many things you can do to make your home safe and to help prevent falls. What can I do on the outside of my home? Regularly fix the edges of walkways and driveways and fix any cracks. Remove anything that might make you trip as you walk through a door, such as a raised step or threshold. Trim any bushes or trees on the path to your home. Use bright outdoor lighting. Clear any walking paths of anything that might make someone trip, such as rocks or tools. Regularly check to see if handrails are loose or broken. Make sure that both sides of any steps have handrails. Any raised decks and porches should have guardrails on the edges. Have any leaves, snow, or ice cleared regularly. Use sand or salt on walking paths during winter. Clean up any spills in your garage right away. This includes oil or grease spills. What can I do in the bathroom? Use night lights. Install grab bars by the toilet and in the tub and shower. Do not use towel bars as grab bars. Use non-skid mats or decals in the tub or shower. If you need to sit down in the shower, use a plastic, non-slip stool. Keep the floor dry. Clean up any water that spills on the floor as soon as it happens. Remove soap buildup in the tub or shower regularly. Attach bath mats securely with double-sided non-slip rug tape. Do not have throw rugs and other things on the floor that can make you trip. What can I do in the bedroom? Use night lights. Make sure that you have a light by your bed that  is easy to reach. Do not use any sheets or blankets that are too big for your bed. They should not hang down onto the floor. Have a firm chair that has side arms. You can use this for support while you get dressed. Do not have throw rugs and other things on the floor that can make you trip. What can I do in the kitchen? Clean up any spills right away. Avoid walking on wet floors. Keep items that you use a lot in easy-to-reach places. If you need to reach something above you, use a strong step stool that has a grab bar. Keep electrical cords out of the way. Do not use floor polish or wax that makes floors slippery. If you must use wax, use non-skid floor wax. Do not have throw rugs and other things on the floor that can make you trip. What can I do with my stairs? Do not leave any items on the stairs. Make sure that there are handrails on both sides of the stairs and use them. Fix handrails that are broken or loose. Make sure that handrails are as long as the stairways. Check any carpeting to make sure that it is firmly attached to the stairs. Fix any carpet that is loose or worn. Avoid having throw rugs at the top or bottom of the stairs. If you do have throw rugs, attach them to the floor with  carpet tape. Make sure that you have a light switch at the top of the stairs and the bottom of the stairs. If you do not have them, ask someone to add them for you. What else can I do to help prevent falls? Wear shoes that: Do not have high heels. Have rubber bottoms. Are comfortable and fit you well. Are closed at the toe. Do not wear sandals. If you use a stepladder: Make sure that it is fully opened. Do not climb a closed stepladder. Make sure that both sides of the stepladder are locked into place. Ask someone to hold it for you, if possible. Clearly mark and make sure that you can see: Any grab bars or handrails. First and last steps. Where the edge of each step is. Use tools that help you  move around (mobility aids) if they are needed. These include: Canes. Walkers. Scooters. Crutches. Turn on the lights when you go into a dark area. Replace any light bulbs as soon as they burn out. Set up your furniture so you have a clear path. Avoid moving your furniture around. If any of your floors are uneven, fix them. If there are any pets around you, be aware of where they are. Review your medicines with your doctor. Some medicines can make you feel dizzy. This can increase your chance of falling. Ask your doctor what other things that you can do to help prevent falls. This information is not intended to replace advice given to you by your health care provider. Make sure you discuss any questions you have with your health care provider. Document Released: 02/13/2009 Document Revised: 09/25/2015 Document Reviewed: 05/24/2014 Elsevier Interactive Patient Education  2017 [employer].

## 2021-12-02 ENCOUNTER — Other Ambulatory Visit: Payer: Self-pay | Admitting: Physician Assistant

## 2021-12-02 DIAGNOSIS — G40909 Epilepsy, unspecified, not intractable, without status epilepticus: Secondary | ICD-10-CM

## 2021-12-03 NOTE — Telephone Encounter (Signed)
Pt called, advised her lab was due in order to get refill on tegretol. Pt states that she was told at last OV she could have labs drawn at next OV since it wasn't added when she had labs drawn on 11/05/21. Pt has appt on 12/16/21 and said she will come in early to have labs done.

## 2021-12-03 NOTE — Telephone Encounter (Signed)
Pt is coming in for appt on 12/16/21 and will have labs drawn.  Requested Prescriptions  Pending Prescriptions Disp Refills  . TEGRETOL-XR 400 MG 12 hr tablet [Pharmacy Med Name: TEGRETOL XR 400 MG TABLET] 60 tablet 0    Sig: TAKE 1 TABLET BY MOUTH TWICE A DAY     Neurology:  Anticonvulsants - carbamazepine Failed - 12/02/2021 12:34 PM      Failed - Carbamazepine (serum) in normal range and within 360 days    Carbamazepine, Total  Date Value Ref Range Status  08/09/2013 4.9 4.0 - 12.0 mcg/mL Final   Carbamazepine Lvl  Date Value Ref Range Status  01/23/2016 4.4 4.0 - 12.0 ug/mL Final         Failed - HGB in normal range and within 360 days    Hemoglobin  Date Value Ref Range Status  11/05/2021 9.9 (L) 11.1 - 15.9 g/dL Final         Failed - HCT in normal range and within 360 days    Hematocrit  Date Value Ref Range Status  11/05/2021 31.5 (L) 34.0 - 46.6 % Final         Failed - Cr in normal range and within 360 days    Creat  Date Value Ref Range Status  02/28/2017 1.03 0.50 - 1.05 mg/dL Final    Comment:    For patients >55 years of age, the reference limit for Creatinine is approximately 13% higher for people identified as African-American. .    Creatinine, Ser  Date Value Ref Range Status  11/05/2021 1.51 (H) 0.57 - 1.00 mg/dL Final   Creatinine, POC  Date Value Ref Range Status  07/20/2016 NA mg/dL Final         Passed - AST in normal range and within 360 days    AST  Date Value Ref Range Status  11/05/2021 17 0 - 40 IU/L Final   SGOT(AST)  Date Value Ref Range Status  08/16/2014 54 (H) U/L Final    Comment:    15-41 NOTE: New Reference Range  07/09/14          Passed - ALT in normal range and within 360 days    ALT  Date Value Ref Range Status  11/05/2021 17 0 - 32 IU/L Final   SGPT (ALT)  Date Value Ref Range Status  08/16/2014 49 U/L Final    Comment:    14-54 NOTE: New Reference Range  07/09/14          Passed - WBC in normal range  and within 360 days    WBC  Date Value Ref Range Status  11/05/2021 5.5 3.4 - 10.8 x10E3/uL Final  02/27/2019 5.3 4.0 - 10.5 K/uL Final         Passed - PLT in normal range and within 360 days    Platelets  Date Value Ref Range Status  11/05/2021 244 150 - 450 x10E3/uL Final         Passed - Na in normal range and within 360 days    Sodium  Date Value Ref Range Status  11/05/2021 142 134 - 144 mmol/L Final  08/16/2014 144 mmol/L Final    Comment:    135-145 NOTE: New Reference Range  07/09/14          Passed - Completed PHQ-2 or PHQ-9 in the last 360 days      Passed - Valid encounter within last 12 months    Recent Outpatient Visits  1 month ago Type 2 diabetes mellitus without complication, without long-term current use of insulin ([place])   [facility] [place], [place], PA-C   2 months ago Type 2 diabetes mellitus without complication, without long-term current use of insulin ([place])   [facility] [place], [place], PA-C   5 months ago Acute pansinusitis, recurrence not specified   [employer], Dani Gobble, PA-C   6 months ago Fall, subsequent encounter   [employer], DO   10 months ago Depression, major, recurrent, moderate [facility])   [facility] Gwyneth Sprout, FNP      Future Appointments            In 1 week [name], PA-C [employer], [place]

## 2021-12-09 DIAGNOSIS — H40001 Preglaucoma, unspecified, right eye: Secondary | ICD-10-CM | POA: Diagnosis not present

## 2021-12-09 DIAGNOSIS — H18603 Keratoconus, unspecified, bilateral: Secondary | ICD-10-CM | POA: Diagnosis not present

## 2021-12-15 NOTE — Progress Notes (Unsigned)
I,Jana Delecia Vastine,acting as a Education administrator for [employer], PA-C.,have documented all relevant documentation on the behalf of [name], PA-C,as directed by  [employer], PA-C while in the presence of [employer], PA-C.   Established patient visit   Patient: Abigail Figueroa   DOB: 07-Jun-1960   61 y.o. Female  MRN: [id number] Visit Date: 12/16/2021  Today's healthcare provider: [name], PA-C   No chief complaint on file.  Subjective    Diabetes Mellitus Type II, Follow-up  Lab Results  Component Value Date   HGBA1C 8.5 (A) 10/27/2021   HGBA1C 9.2 (A) 09/22/2021   HGBA1C 8.4 (H) 08/15/2020   Wt Readings from Last 3 Encounters:  11/26/21 207 lb (93.9 kg)  10/27/21 207 lb 1.6 oz (93.9 kg)  09/22/21 210 lb (95.3 kg)   Last seen for diabetes 6 weeks ago.  Management since then includes ***. She reports {excellent/good/fair/poor:19665} compliance with treatment. She {is/is not:21021397} having side effects. Symptoms: {Yes/No:20286} fatigue {Yes/No:20286} foot ulcerations  {Yes/No:20286} appetite changes {Yes/No:20286} nausea  {Yes/No:20286} paresthesia of the feet  {Yes/No:20286} polydipsia  {Yes/No:20286} polyuria {Yes/No:20286} visual disturbances   {Yes/No:20286} vomiting     Home blood sugar records: {diabetes glucometry results:16657}  Episodes of hypoglycemia? {Yes/No:20286}  Current insulin regiment:  Most Recent Eye Exam: *** {Current exercise:16438:::1} {Current diet habits:16563:::1}  Per labs patient is to have Tegretol level rechecked.   Pertinent Labs: Lab Results  Component Value Date   CHOL 132 11/05/2021   HDL 73 11/05/2021   LDLCALC 41 11/05/2021   TRIG 100 11/05/2021   CHOLHDL 1.8 11/05/2021   Lab Results  Component Value Date   NA 142 11/05/2021   K 5.4 (H) 11/05/2021   CREATININE 1.51 (H) 11/05/2021   EGFR 39 (L) 11/05/2021   MICROALBUR 37.6 (H) 02/27/2019   LABMICR 3.4 10/27/2021      ---------------------------------------------------------------------------------------------------   Medications: Outpatient Medications Prior to Visit  Medication Sig   amLODipine (NORVASC) 5 MG tablet TAKE 1 AND 1/2 TABLETS BY MOUTH EVERY DAY   Azelastine HCl 0.15 % SOLN Place 2 sprays into both nostrils 2 (two) times daily.   baclofen (LIORESAL) 10 MG tablet Take 10 mg by mouth 2 (two) times daily as needed for muscle spasms.    BOTOX 100 UNITS SOLR injection Inject into the muscle every 3 (three) months.    busPIRone (BUSPAR) 15 MG tablet TAKE 1 TABLET BY MOUTH 2 TIMES DAILY.   chlorproMAZINE (THORAZINE) 25 MG tablet Take 50 mg by mouth 2 (two) times daily.   clindamycin (CLEOCIN T) 1 % lotion APPLY TOPICALLY ONCE A DAY TO AREAS OF ACNE ON THE FACE   diclofenac Sodium (VOLTAREN) 1 % GEL Apply 4 g topically 4 (four) times daily.   EMGALITY 120 MG/ML SOAJ Inject into the skin as needed.   EPINEPHrine 0.3 mg/0.3 mL IJ SOAJ injection Inject 0.3 mLs (0.3 mg total) into the muscle as needed for up to 1 dose for anaphylaxis (Call 911 if used.).   ezetimibe (ZETIA) 10 MG tablet TAKE 1 TABLET (10 MG TOTAL) BY MOUTH DAILY. PLEASE SCHEDULE APPOINTMENT WITH DR. Claiborne Billings FOR REFILLS.   fluocinonide ointment (LIDEX) 1.84 % Apply 1 application topically 2 (two) times daily.   fluticasone (FLONASE) 50 MCG/ACT nasal spray Place 2 sprays into both nostrils in the morning and at bedtime.   furosemide (LASIX) 20 MG tablet TAKE 1 TABLET BY MOUTH EVERY DAY   glucose blood (ONETOUCH VERIO) test strip TEST FASTING GLUCOSE DAILY AS  DIRECTED   hydrocortisone 1 % ointment Apply 1 application topically 2 (two) times daily. To areas of itching not to use if any blisters or open wounds. USE PRN itching.   lamoTRIgine (LAMICTAL) 100 MG tablet Take 100 mg by mouth 2 (two) times daily.   Lancets (ONETOUCH ULTRASOFT) lancets Test fasting each morning and 2 hours before supper. Retest if having hypoglycemic symptoms.    levocetirizine (XYZAL) 5 MG tablet TAKE 1 TABLET BY MOUTH EVERY DAY AS NEEDED   metFORMIN (GLUCOPHAGE) 500 MG tablet TAKE 2 TABLETS BY MOUTH IN THE MORNING ,1 TABLET AT LUNCH, & TAKE 2 TABLETS IN THE EVENING   metoprolol tartrate (LOPRESSOR) 100 MG tablet Take 1 tablet (100 mg total) by mouth 2 (two) times daily.   promethazine (PHENERGAN) 25 MG tablet Take 25 mg by mouth every 4 (four) hours as needed.   rosuvastatin (CRESTOR) 20 MG tablet TAKE 1 TABLET BY MOUTH EVERY DAY   Semaglutide (RYBELSUS) 3 MG TABS Take 1 tablet by mouth daily.   TEGRETOL-XR 400 MG 12 hr tablet TAKE 1 TABLET BY MOUTH TWICE A DAY   Vilazodone HCl (VIIBRYD) 40 MG TABS TAKE 1 TABLET BY MOUTH EVERY DAY   No facility-administered medications prior to visit.    Review of Systems  {Labs  Heme  Chem  Endocrine  Serology  Results Review (optional):23779}   Objective    LMP  (LMP Unknown)  {Show previous vital signs (optional):23777}  Physical Exam  ***  No results found for any visits on 12/16/21.  Assessment & Plan     ***  No follow-ups on file.      {provider attestation***:1}   [name], Hershal Coria  [facility] [phone] (phone) [phone] (fax)  [place]

## 2021-12-16 ENCOUNTER — Ambulatory Visit (INDEPENDENT_AMBULATORY_CARE_PROVIDER_SITE_OTHER): Payer: Medicare Other | Admitting: Physician Assistant

## 2021-12-16 ENCOUNTER — Encounter: Payer: Self-pay | Admitting: Physician Assistant

## 2021-12-16 VITALS — BP 128/70 | HR 67 | Temp 98.4°F | Resp 16 | Wt 201.5 lb

## 2021-12-16 DIAGNOSIS — F411 Generalized anxiety disorder: Secondary | ICD-10-CM

## 2021-12-16 DIAGNOSIS — R569 Unspecified convulsions: Secondary | ICD-10-CM

## 2021-12-16 DIAGNOSIS — E119 Type 2 diabetes mellitus without complications: Secondary | ICD-10-CM

## 2021-12-16 DIAGNOSIS — F331 Major depressive disorder, recurrent, moderate: Secondary | ICD-10-CM | POA: Diagnosis not present

## 2021-12-16 DIAGNOSIS — R600 Localized edema: Secondary | ICD-10-CM | POA: Diagnosis not present

## 2021-12-16 DIAGNOSIS — N183 Chronic kidney disease, stage 3 unspecified: Secondary | ICD-10-CM | POA: Diagnosis not present

## 2021-12-16 DIAGNOSIS — E669 Obesity, unspecified: Secondary | ICD-10-CM | POA: Diagnosis not present

## 2021-12-17 ENCOUNTER — Other Ambulatory Visit: Payer: Self-pay | Admitting: Physician Assistant

## 2021-12-17 ENCOUNTER — Telehealth: Payer: Self-pay | Admitting: Physician Assistant

## 2021-12-17 DIAGNOSIS — R569 Unspecified convulsions: Secondary | ICD-10-CM

## 2021-12-17 LAB — CARBAMAZEPINE LEVEL, TOTAL: Carbamazepine (Tegretol), S: 5.2 ug/mL (ref 4.0–12.0)

## 2021-12-17 MED ORDER — CARBAMAZEPINE ER 400 MG PO TB12: 400.0000 mg | ORAL_TABLET | Freq: Two times a day (BID) | ORAL | 1 refills | Status: DC

## 2021-12-17 NOTE — Progress Notes (Signed)
Hello Abigail Figueroa ,   Your labwork results are back and the tegretol level wtn therapeutic levels No changes need to be made to medications, and no further tests need to be ordered.  Any questions please reach out to the office or message me on MyChart!  Best, [name], PA-C

## 2021-12-17 NOTE — Telephone Encounter (Signed)
Pt informed

## 2021-12-17 NOTE — Progress Notes (Signed)
Rx sent per pt request 

## 2021-12-17 NOTE — Telephone Encounter (Signed)
Pt called saying the generic tegretol was sent t the pharmacy but she has to have the name brand of this medication  [facility] [address]  [place]

## 2021-12-24 ENCOUNTER — Other Ambulatory Visit: Payer: Self-pay | Admitting: Physician Assistant

## 2021-12-24 DIAGNOSIS — E119 Type 2 diabetes mellitus without complications: Secondary | ICD-10-CM

## 2021-12-24 NOTE — Telephone Encounter (Signed)
Copied from [place] [phone]. Topic: General - Other >> Dec 24, 2021 12:06 PM Everette C wrote: Reason for CRM: Medication Refill - Medication: Semaglutide (RYBELSUS) 3 MG TABS [[id number]] - patient has 0 tablets remaining   Has the patient contacted their pharmacy? Yes.  The patient has been directed to contact their PCP  (Agent: If no, request that the patient contact the pharmacy for the refill. If patient does not wish to contact the pharmacy document the reason why and proceed with request.) (Agent: If yes, when and what did the pharmacy advise?)  Preferred Pharmacy (with phone number or street name): [id number] [place] [id number]: [phone]: [phone]: Not open 24 hours   Has the patient been seen for an appointment in the last year OR does the patient have an upcoming appointment? Yes.    Agent: Please be advised that RX refills may take up to 3 business days. We ask that you follow-up with your pharmacy.

## 2021-12-24 NOTE — Telephone Encounter (Signed)
Requested medication (s) are due for refill today: yes  Requested medication (s) are on the active medication list: yes  Last refill:  10/21/21  Future visit scheduled: yes  Notes to clinic:  Unable to refill per protocol, last refill  denied by provider 3/61/44,[id number] duplicate.     Requested Prescriptions  Pending Prescriptions Disp Refills   Semaglutide (RYBELSUS) 3 MG TABS 30 tablet 0    Sig: Take 1 tablet by mouth daily.     Off-Protocol Failed - 12/24/2021 12:40 PM      Failed - Medication not assigned to a protocol, review manually.      Passed - Valid encounter within last 12 months    Recent Outpatient Visits           1 week ago Type 2 diabetes mellitus without complication, without long-term current use of insulin ([place])   [facility] [place], [place], PA-C   1 month ago Type 2 diabetes mellitus without complication, without long-term current use of insulin ([place])   [facility] [place], [place], PA-C   3 months ago Type 2 diabetes mellitus without complication, without long-term current use of insulin ([place])   [facility] [place], [place], PA-C   6 months ago Acute pansinusitis, recurrence not specified   [employer], Dani Gobble, PA-C   6 months ago Fall, subsequent encounter   [place], DO       Future Appointments             In 1 month [place], Letitia Libra, PA-C [employer], [facility]

## 2021-12-25 ENCOUNTER — Other Ambulatory Visit: Payer: Self-pay | Admitting: Physician Assistant

## 2021-12-25 DIAGNOSIS — E119 Type 2 diabetes mellitus without complications: Secondary | ICD-10-CM

## 2021-12-25 NOTE — Telephone Encounter (Signed)
Requested medications are due for refill today.  yes  Requested medications are on the active medications list.  yes  Last refill. 10/21/2021 #30 0 refills  Future visit scheduled.   yes  Notes to clinic.  Medication refill was refused for "already reordered" I do not see reorder in chart. Please advise.    Requested Prescriptions  Pending Prescriptions Disp Refills   RYBELSUS 3 MG TABS [Pharmacy Med Name: RYBELSUS 3 MG TABLET] 30 tablet 0    Sig: TAKE 1 TABLET BY MOUTH EVERY DAY     Off-Protocol Failed - 12/25/2021  4:40 PM      Failed - Medication not assigned to a protocol, review manually.      Passed - Valid encounter within last 12 months    Recent Outpatient Visits           1 week ago Type 2 diabetes mellitus without complication, without long-term current use of insulin ([place])   [facility] [place], [place], PA-C   1 month ago Type 2 diabetes mellitus without complication, without long-term current use of insulin ([place])   [facility] [place], [place], PA-C   3 months ago Type 2 diabetes mellitus without complication, without long-term current use of insulin ([place])   [facility] [place], [place], PA-C   6 months ago Acute pansinusitis, recurrence not specified   [employer], Dani Gobble, PA-C   6 months ago Fall, subsequent encounter   [place], DO       Future Appointments             In 1 month [place], Letitia Libra, PA-C [employer], [facility]

## 2021-12-25 NOTE — Telephone Encounter (Signed)
Patient called in states is out of   RYBELSUS 3 MG TABS . I also told her of the 48-72hr turn around.

## 2021-12-25 NOTE — Telephone Encounter (Signed)
Requested medications are due for refill today.  unsure  Requested medications are on the active medications list.  yes  Last refill. 10/21/2021 #30 0 refills  Future visit scheduled.   yes  Notes to clinic.  Rx refused  - already responded to. I do not medication refilled in chart. Please advise.    Requested Prescriptions  Pending Prescriptions Disp Refills   RYBELSUS 3 MG TABS [Pharmacy Med Name: RYBELSUS 3 MG TABLET] 30 tablet 0    Sig: TAKE 1 TABLET BY MOUTH EVERY DAY     Off-Protocol Failed - 12/25/2021  4:53 PM      Failed - Medication not assigned to a protocol, review manually.      Passed - Valid encounter within last 12 months    Recent Outpatient Visits           1 week ago Type 2 diabetes mellitus without complication, without long-term current use of insulin ([place])   [facility] [place], [place], PA-C   1 month ago Type 2 diabetes mellitus without complication, without long-term current use of insulin ([place])   [facility] [place], [place], PA-C   3 months ago Type 2 diabetes mellitus without complication, without long-term current use of insulin ([place])   [facility] [place], [place], PA-C   6 months ago Acute pansinusitis, recurrence not specified   [employer], Dani Gobble, PA-C   6 months ago Fall, subsequent encounter   [place], DO       Future Appointments             In 1 month [place], Letitia Libra, PA-C [employer], [facility]

## 2021-12-31 DIAGNOSIS — M542 Cervicalgia: Secondary | ICD-10-CM | POA: Diagnosis not present

## 2021-12-31 DIAGNOSIS — G518 Other disorders of facial nerve: Secondary | ICD-10-CM | POA: Diagnosis not present

## 2021-12-31 DIAGNOSIS — M791 Myalgia, unspecified site: Secondary | ICD-10-CM | POA: Diagnosis not present

## 2021-12-31 DIAGNOSIS — G43719 Chronic migraine without aura, intractable, without status migrainosus: Secondary | ICD-10-CM | POA: Diagnosis not present

## 2022-01-05 NOTE — Progress Notes (Signed)
Cardiology Office Note:    Date:  01/07/2022   ID:  Abigail Figueroa, DOB [birth year], MRN [id number]  PCP:  [name], [place] Providers Cardiologist:  Shelva Majestic, [facility] APP:  Ledora Bottcher, [place] { Referring Figueroa: [name], PA-C   Chief Complaint  Patient presents with   Follow-up    Lower extremity swelling    History of Present Illness:    Abigail Figueroa is a 61 y.o. female with a hx of hypertension, palpitations, hyperlipidemia, OSA on CPAP, seizure disorder, DM 2, and anemia.    Heart catheterization 08/2015 with normal coronaries and normal LV function.  This was in response to an abnormal stress test most likely due to body habitus. Unfortunately she stopped using her CPAP machine because it started making a loud noise.  She was last seen 01/09/21 with Abigail Figueroa. She reported chest discomfort and arm pain at rest that sounded atypical. No ischemic evaluation planned.   She presents for follow up. She recounts an episode of sleep apnea in which she stopped breathing. She has been refitted for a mask. Her husband told her she stopped breathing and that has prompted better compliance.   No cardiac complaints other than lower extremity swelling. She has been taking 40 mg lasix daily. Labs reviewed. She has been eating more salt lately (peanuts) and has been gaining weight. Some dyspnea on exertion.   Past Medical History:  Diagnosis Date   Abnormal stress test    a. 10/2009: Normal myocardial perfusion imaging; b. 08/2015 MV: medium defect of mod severity in mid ant apical region w/ mild HK of the distal inf wall;  c. 08/2015 Cath: nl Cors, EF 60%.   Anemia    Angio-edema    Anxiety    Arthritis    Bell's palsy    Depression    Edema    Essential hypertension    Frequent urination    Frequent urination at night    Headache    migraines, gets botox injections   Heart palpitations June 2013   Event monitor showing sinus  tachycardia, PACs with couplets and triplets.   Hx of echocardiogram    a. 10/2009 Echo: showed normal left ventricular function, mild left ventricular hypertrophy, and no significant valve abnormalities.   Hypercholesterolemia    Migraine    Morbid obesity (Abigail Figueroa)    Neuromuscular disorder (HCC)    OSA (obstructive sleep apnea)    has been on continuous positive airway pressure   Seizure disorder (Abigail Figueroa)    Seizures (Abigail Figueroa)    Type II diabetes mellitus ([place])    Urticaria     Past Surgical History:  Procedure Laterality Date   ABDOMINAL HYSTERECTOMY  1990   without BSO   BACK SURGERY  1900s 2001   x2 with "cage put in"   [place] N/A 08/06/2015   Procedure: Left Heart Cath and Coronary Angiography;  Surgeon: Abigail Figueroa;  Location: [facility];  Service: Cardiovascular;  Laterality: N/A;   CARPAL TUNNEL RELEASE Bilateral    CHOLECYSTECTOMY  1980   COLONOSCOPY WITH PROPOFOL N/A 08/30/2017   Procedure: COLONOSCOPY WITH PROPOFOL;  Surgeon: Abigail Figueroa;  Location: [facility];  Service: Endoscopy;  Laterality: N/A;   DG THUMB LEFT HAND  01/24/2018   repair joint in left thumb   DORSAL COMPARTMENT RELEASE Left 10/25/2014   Procedure: LEFT FIRST  DORSAL COMPARTMENT  RELEASE AND RADIAL TENOSYNOVECTOMY ;  Surgeon: Abigail Figueroa;  Location: [facility];  Service: Orthopedics;  Laterality: Left;   HAND SURGERY     KNEE SURGERY Right 2012   meniscus tear   KNEE SURGERY  2012   LAMINECTOMY  1995   TUBAL LIGATION  1982   WRIST SURGERY Right     Current Medications: Current Meds  Medication Sig   amLODipine (NORVASC) 5 MG tablet TAKE 1 AND 1/2 TABLETS BY MOUTH EVERY DAY   Azelastine HCl 0.15 % SOLN Place 2 sprays into both nostrils 2 (two) times daily.   baclofen (LIORESAL) 10 MG tablet Take 10 mg by mouth 2 (two) times daily as needed for muscle spasms.    BOTOX 100 UNITS SOLR injection Inject  into the muscle every 3 (three) months.    busPIRone (BUSPAR) 15 MG tablet TAKE 1 TABLET BY MOUTH 2 TIMES DAILY.   carbamazepine (TEGRETOL XR) 400 MG 12 hr tablet Take 1 tablet (400 mg total) by mouth 2 (two) times daily.   chlorproMAZINE (THORAZINE) 25 MG tablet Take 50 mg by mouth 2 (two) times daily.   clindamycin (CLEOCIN T) 1 % lotion APPLY TOPICALLY ONCE A DAY TO AREAS OF ACNE ON THE FACE   diclofenac Sodium (VOLTAREN) 1 % GEL Apply 4 g topically 4 (four) times daily.   EMGALITY 120 MG/ML SOAJ Inject into the skin as needed.   EPINEPHrine 0.3 mg/0.3 mL IJ SOAJ injection Inject 0.3 mLs (0.3 mg total) into the muscle as needed for up to 1 dose for anaphylaxis (Call 911 if used.).   ezetimibe (ZETIA) 10 MG tablet TAKE 1 TABLET (10 MG TOTAL) BY MOUTH DAILY. PLEASE SCHEDULE APPOINTMENT WITH DR. Claiborne Billings FOR REFILLS.   fluocinonide ointment (LIDEX) 4.16 % Apply 1 application topically 2 (two) times daily.   fluticasone (FLONASE) 50 MCG/ACT nasal spray Place 2 sprays into both nostrils in the morning and at bedtime.   glucose blood (ONETOUCH VERIO) test strip TEST FASTING GLUCOSE DAILY AS DIRECTED   hydrocortisone 1 % ointment Apply 1 application topically 2 (two) times daily. To areas of itching not to use if any blisters or open wounds. USE PRN itching.   lamoTRIgine (LAMICTAL) 100 MG tablet Take 100 mg by mouth 2 (two) times daily.   Lancets (ONETOUCH ULTRASOFT) lancets Test fasting each morning and 2 hours before supper. Retest if having hypoglycemic symptoms.   levocetirizine (XYZAL) 5 MG tablet TAKE 1 TABLET BY MOUTH EVERY DAY AS NEEDED   metFORMIN (GLUCOPHAGE) 500 MG tablet TAKE 2 TABLETS BY MOUTH IN THE MORNING ,1 TABLET AT LUNCH, & TAKE 2 TABLETS IN THE EVENING   metoprolol tartrate (LOPRESSOR) 100 MG tablet Take 1 tablet (100 mg total) by mouth 2 (two) times daily.   promethazine (PHENERGAN) 25 MG tablet Take 25 mg by mouth every 4 (four) hours as needed.   rosuvastatin (CRESTOR) 20 MG  tablet TAKE 1 TABLET BY MOUTH EVERY DAY   RYBELSUS 3 MG TABS TAKE 1 TABLET BY MOUTH EVERY DAY   Vilazodone HCl (VIIBRYD) 40 MG TABS TAKE 1 TABLET BY MOUTH EVERY DAY   [DISCONTINUED] furosemide (LASIX) 20 MG tablet TAKE 1 TABLET BY MOUTH EVERY DAY     Allergies:   Morphine, Morphine and related, Oxycodone, Oxycodone hcl, Aimovig [erenumab-aooe], Augmentin [amoxicillin-pot clavulanate], and Morphine sulfate   Social History   Socioeconomic History   Marital status: Married    Spouse name: Not on file   Number of children:  2   Years of education: Not on file   Highest education level: 12th grade  Occupational History   Occupation: disabled  Tobacco Use   Smoking status: Former    Types: Cigarettes    Quit date: 01/03/1983    Years since quitting: 39.0   Smokeless tobacco: Never   Tobacco comments:    quit in 1984  Vaping Use   Vaping Use: Never used  Substance and Sexual Activity   Alcohol use: No    Alcohol/week: 0.0 standard drinks of alcohol   Drug use: No   Sexual activity: Never  Other Topics Concern   Not on file  Social History Narrative   ** Merged History Encounter **       She is a married mother of 2, grandmother 3. She tries to get exercise but is not doing any routine program.   She quit smoking over 25 years ago does not drink alcohol.   Social Determinants of Health   Financial Resource Strain: Low Risk  (11/26/2021)   Overall Financial Resource Strain (CARDIA)    Difficulty of Paying Living Expenses: Not hard at all  Food Insecurity: No Food Insecurity (11/26/2021)   Hunger Vital Sign    Worried About Running Out of Food in the Last Year: Never true    Ran Out of Food in the Last Year: Never true  Transportation Needs: Unmet Transportation Needs (11/26/2021)   PRAPARE - Transportation    Lack of Transportation (Medical): Yes    Lack of Transportation (Non-Medical): Yes  Physical Activity: Insufficiently Active (11/26/2021)   Exercise Vital Sign    Days  of Exercise per Week: 7 days    Minutes of Exercise per Session: 20 min  Stress: Stress Concern Present (11/26/2021)   [place]    Feeling of Stress : To some extent  Social Connections: Moderately Isolated (11/26/2021)   Social Connection and Isolation Panel [NHANES]    Frequency of Communication with Friends and Family: More than three times a week    Frequency of Social Gatherings with Friends and Family: More than three times a week    Attends Religious Services: Never    Marine scientist or Organizations: No    Attends Music therapist: Never    Marital Status: Married     Family History: The patient's family history includes Alzheimer's disease in her maternal grandmother; Dementia in her maternal grandmother; Diabetes in her maternal grandfather; Heart attack in her mother; Hypertension in her maternal grandfather; Sarcoidosis in her mother; Seizures in her mother. There is no history of Breast cancer, Allergic rhinitis, Asthma, Eczema, or Urticaria.  ROS:   Please see the history of present illness.     All other systems reviewed and are negative.  EKGs/Labs/Other Studies Reviewed:    Nuclear stress test 2017:  Nuclear stress EF: 56%. Mild hypokinesis in the distal inferior wall segment. T wave inversion was noted during stress in the II, III, aVF, V5 and V6 leads. Defect 1: There is a medium defect of moderate severity present in the mid anterior, apical anterior and apex location. Findings consistent with ischemia. Note, sensitivity and specificity reduced by body habitus, 2 day study. This is an intermediate risk study.   Candee Furbish, Figueroa   Cardiac catheterization 08/06/2015 The left ventricular systolic function is normal.   Normal coronary arteries. Normal LV function with an ejection fraction of at least 60%.   RECOMMENDATION: The  patient's stress test is a false positive study,  most likely due to her morbid obesity and body habitus.  She has normal coronary arteries and normal LV function.  She is given preoperative clearance for her planned lumbar surgery to be done by Dr. Arnoldo Morale.  EKG:  EKG is  ordered today.  The ekg ordered today demonstrates sinus rhythm with HR 68, ?low voltage LVH  Recent Labs: 11/05/2021: ALT 17; BUN 21; Creatinine, Ser 1.51; Hemoglobin 9.9; Platelets 244; Potassium 5.4; Sodium 142; TSH 2.560  Recent Lipid Panel    Component Value Date/Time   CHOL 132 11/05/2021 0924   CHOL 277 (H) 08/16/2014 0843   TRIG 100 11/05/2021 0924   TRIG 167 (H) 08/16/2014 0843   HDL 73 11/05/2021 0924   HDL 77 08/16/2014 0843   CHOLHDL 1.8 11/05/2021 0924   CHOLHDL 2.8 02/27/2019 0818   VLDL 22 02/27/2019 0818   VLDL 33 08/16/2014 0843   LDLCALC 41 11/05/2021 0924   LDLCALC 190 (H) 02/28/2017 1009   LDLCALC 167 (H) 08/16/2014 0843     Risk Assessment/Calculations:                Physical Exam:    VS:  BP 138/78   Pulse 68   Ht '5\' 3"'$  (1.6 m)   Wt 204 lb (92.5 kg)   LMP  (LMP Unknown)   SpO2 98%   BMI 36.14 kg/m     Wt Readings from Last 3 Encounters:  01/07/22 204 lb (92.5 kg)  12/16/21 201 lb 8 oz (91.4 kg)  11/26/21 207 lb (93.9 kg)     GEN:  Well nourished, well developed in no acute distress HEENT: Normal NECK: No JVD; No carotid bruits LYMPHATICS: No lymphadenopathy CARDIAC: RRR, no murmurs, rubs, gallops RESPIRATORY:  Clear to auscultation without rales, wheezing or rhonchi  ABDOMEN: Soft, non-tender, non-distended MUSCULOSKELETAL:  B LE edema SKIN: Warm and dry NEUROLOGIC:  Alert and oriented x 3 PSYCHIATRIC:  Normal affect   ASSESSMENT:    1. Lower extremity edema   2. Essential hypertension   3. OSA (obstructive sleep apnea)   4.  Hyperlipidemia: Possible familial    PLAN:    In order of problems listed above:  Lower extremity swelling EF 56% on lexiscan myoview in 2017 Suspect a component of diastolic  dysfunction Increase lasix to 40 mg qAM and 20 mg at lunch for 5 days, then reduce to 40 mg lasix. If swelling persists, will check an echocardiogram. Also consider low voltage LVH   Hypertension - metoprolol, amlodipine, and furosemide     OSA Now with new mask and is compliant   Hyperlipidemia with LDL goal < 70 11/05/2021: Cholesterol, Total 132; HDL 73; LDL Chol Calc (NIH) 41; Triglycerides 100 Clean coronaries by cath in 2017 Continue statin and zetia   Follow up in 1 month.      Medication Adjustments/Labs and Tests Ordered: Current medicines are reviewed at length with the patient today.  Concerns regarding medicines are outlined above.  No orders of the defined types were placed in this encounter.  Meds ordered this encounter  Medications   DISCONTD: furosemide (LASIX) 20 MG tablet    Sig: Take 1 tablet (20 mg total) by mouth daily.    Dispense:  90 tablet    Refill:  3   furosemide (LASIX) 40 MG tablet    Sig: Take 1 tablet (40 mg total) by mouth daily.    Dispense:  90 tablet  Refill:  3    Patient Instructions  Medication Instructions:  Your physician has recommended you make the following change in your medication:  INCREASE: Lasix '40mg'$  in the AM and '20mg'$  in the afternoon for the next 5 days then on day 6 please resume only '40mg'$  lasix in the AM *If you need a refill on your cardiac medications before your next appointment, please call your pharmacy*  It is okay for you to take OTC Ocean Nasal Spray as needed  And '600mg'$  of Mucinex in the evening as needed.    Lab Work: NONE If you have labs (blood work) drawn today and your tests are completely normal, you will receive your results only by: [place] (if you have MyChart) OR A paper copy in the mail If you have any lab test that is abnormal or we need to change your treatment, we will call you to review the results.   Testing/Procedures: NONE   Follow-Up: At [facility], you and  your health needs are our priority.  As part of our continuing mission to provide you with exceptional heart care, we have created designated Provider Care Teams.  These Care Teams include your primary Cardiologist (physician) and Advanced Practice Providers (APPs -  Physician Assistants and Nurse Practitioners) who all work together to provide you with the care you need, when you need it.  We recommend signing up for the patient portal called "MyChart".  Sign up information is provided on this After Visit Summary.  MyChart is used to connect with patients for Virtual Visits (Telemedicine).  Patients are able to view lab/test results, encounter notes, upcoming appointments, etc.  Non-urgent messages can be sent to your provider as well.   To learn more about what you can do with MyChart, go to [email or web address].    Your next appointment:   1 month(s)  The format for your next appointment:   In Person  Provider:   Fabian Sharp, PA-C        Signed, [place], [place]  01/07/2022 1:09 PM    [place]

## 2022-01-07 ENCOUNTER — Encounter: Payer: Self-pay | Admitting: Physician Assistant

## 2022-01-07 ENCOUNTER — Ambulatory Visit: Payer: Medicare Other | Attending: Physician Assistant | Admitting: Physician Assistant

## 2022-01-07 VITALS — BP 138/78 | HR 68 | Ht 63.0 in | Wt 204.0 lb

## 2022-01-07 DIAGNOSIS — R600 Localized edema: Secondary | ICD-10-CM

## 2022-01-07 DIAGNOSIS — I10 Essential (primary) hypertension: Secondary | ICD-10-CM

## 2022-01-07 DIAGNOSIS — G4733 Obstructive sleep apnea (adult) (pediatric): Secondary | ICD-10-CM | POA: Diagnosis not present

## 2022-01-07 DIAGNOSIS — E782 Mixed hyperlipidemia: Secondary | ICD-10-CM | POA: Diagnosis not present

## 2022-01-07 MED ORDER — FUROSEMIDE 40 MG PO TABS: 40.0000 mg | ORAL_TABLET | Freq: Every day | ORAL | 3 refills | Status: DC

## 2022-01-07 MED ORDER — FUROSEMIDE 20 MG PO TABS: 20.0000 mg | ORAL_TABLET | Freq: Every day | ORAL | 3 refills | Status: DC

## 2022-01-07 NOTE — Patient Instructions (Addendum)
Medication Instructions:  Your physician has recommended you make the following change in your medication:  INCREASE: Lasix '40mg'$  in the AM and '20mg'$  in the afternoon for the next 5 days then on day 6 please resume only '40mg'$  lasix in the AM *If you need a refill on your cardiac medications before your next appointment, please call your pharmacy*  It is okay for you to take OTC Ocean Nasal Spray as needed  And '600mg'$  of Mucinex in the evening as needed.    Lab Work: NONE If you have labs (blood work) drawn today and your tests are completely normal, you will receive your results only by: [place] (if you have MyChart) OR A paper copy in the mail If you have any lab test that is abnormal or we need to change your treatment, we will call you to review the results.   Testing/Procedures: NONE   Follow-Up: At [facility], you and your health needs are our priority.  As part of our continuing mission to provide you with exceptional heart care, we have created designated Provider Care Teams.  These Care Teams include your primary Cardiologist (physician) and Advanced Practice Providers (APPs -  Physician Assistants and Nurse Practitioners) who all work together to provide you with the care you need, when you need it.  We recommend signing up for the patient portal called "MyChart".  Sign up information is provided on this After Visit Summary.  MyChart is used to connect with patients for Virtual Visits (Telemedicine).  Patients are able to view lab/test results, encounter notes, upcoming appointments, etc.  Non-urgent messages can be sent to your provider as well.   To learn more about what you can do with MyChart, go to [email or web address].    Your next appointment:   1 month(s)  The format for your next appointment:   In Person  Provider:   Fabian Sharp, PA-C

## 2022-01-12 ENCOUNTER — Ambulatory Visit: Payer: Medicare Other | Admitting: Neurology

## 2022-01-14 ENCOUNTER — Other Ambulatory Visit: Payer: Self-pay | Admitting: Physician Assistant

## 2022-01-14 DIAGNOSIS — E119 Type 2 diabetes mellitus without complications: Secondary | ICD-10-CM

## 2022-01-16 ENCOUNTER — Other Ambulatory Visit: Payer: Self-pay | Admitting: Cardiovascular Disease

## 2022-01-21 ENCOUNTER — Encounter: Payer: Self-pay | Admitting: Neurology

## 2022-01-21 ENCOUNTER — Other Ambulatory Visit: Payer: Self-pay | Admitting: Physician Assistant

## 2022-01-21 ENCOUNTER — Ambulatory Visit: Payer: Medicare Other | Admitting: Neurology

## 2022-01-21 ENCOUNTER — Ambulatory Visit (INDEPENDENT_AMBULATORY_CARE_PROVIDER_SITE_OTHER): Payer: Medicare Other | Admitting: Clinical

## 2022-01-21 DIAGNOSIS — R569 Unspecified convulsions: Secondary | ICD-10-CM

## 2022-01-21 DIAGNOSIS — F330 Major depressive disorder, recurrent, mild: Secondary | ICD-10-CM | POA: Diagnosis not present

## 2022-01-21 DIAGNOSIS — F411 Generalized anxiety disorder: Secondary | ICD-10-CM | POA: Diagnosis not present

## 2022-01-21 NOTE — Telephone Encounter (Signed)
Rx 12/17/21 #60 1RF- too soon Requested Prescriptions  Pending Prescriptions Disp Refills  . TEGRETOL-XR 400 MG 12 hr tablet [Pharmacy Med Name: TEGRETOL XR 400 MG TABLET] 60 tablet 1    Sig: TAKE 1 TABLET BY MOUTH TWICE A DAY     Neurology:  Anticonvulsants - carbamazepine Failed - 01/21/2022  9:31 AM      Failed - HGB in normal range and within 360 days    Hemoglobin  Date Value Ref Range Status  11/05/2021 9.9 (L) 11.1 - 15.9 g/dL Final         Failed - HCT in normal range and within 360 days    Hematocrit  Date Value Ref Range Status  11/05/2021 31.5 (L) 34.0 - 46.6 % Final         Failed - Cr in normal range and within 360 days    Creat  Date Value Ref Range Status  02/28/2017 1.03 0.50 - 1.05 mg/dL Final    Comment:    For patients >29 years of age, the reference limit for Creatinine is approximately 13% higher for people identified as African-American. .    Creatinine, Ser  Date Value Ref Range Status  11/05/2021 1.51 (H) 0.57 - 1.00 mg/dL Final   Creatinine, POC  Date Value Ref Range Status  07/20/2016 NA mg/dL Final         Passed - AST in normal range and within 360 days    AST  Date Value Ref Range Status  11/05/2021 17 0 - 40 IU/L Final   SGOT(AST)  Date Value Ref Range Status  08/16/2014 54 (H) U/L Final    Comment:    15-41 NOTE: New Reference Range  07/09/14          Passed - ALT in normal range and within 360 days    ALT  Date Value Ref Range Status  11/05/2021 17 0 - 32 IU/L Final   SGPT (ALT)  Date Value Ref Range Status  08/16/2014 49 U/L Final    Comment:    14-54 NOTE: New Reference Range  07/09/14          Passed - Carbamazepine (serum) in normal range and within 360 days    Carbamazepine, Total  Date Value Ref Range Status  08/09/2013 4.9 4.0 - 12.0 mcg/mL Final   Carbamazepine (Tegretol), S  Date Value Ref Range Status  12/16/2021 5.2 4.0 - 12.0 ug/mL Final    Comment:             In conjunction with other  antiepileptic drugs                                Therapeutic  4.0 -  8.0                                Toxicity     9.0 - 12.0                                    Carbamazepine alone                                Therapeutic  8.0 - 12.0  Detection Limit =  2.0                           <2.0 indicates None Detected          Passed - WBC in normal range and within 360 days    WBC  Date Value Ref Range Status  11/05/2021 5.5 3.4 - 10.8 x10E3/uL Final  02/27/2019 5.3 4.0 - 10.5 K/uL Final         Passed - PLT in normal range and within 360 days    Platelets  Date Value Ref Range Status  11/05/2021 244 150 - 450 x10E3/uL Final         Passed - Na in normal range and within 360 days    Sodium  Date Value Ref Range Status  11/05/2021 142 134 - 144 mmol/L Final  08/16/2014 144 mmol/L Final    Comment:    135-145 NOTE: New Reference Range  07/09/14          Passed - Completed PHQ-2 or PHQ-9 in the last 360 days      Passed - Valid encounter within last 12 months    Recent Outpatient Visits          1 month ago Type 2 diabetes mellitus without complication, without long-term current use of insulin ([place])   [facility] [place], [place], PA-C   2 months ago Type 2 diabetes mellitus without complication, without long-term current use of insulin ([place])   [facility] [place], [place], PA-C   4 months ago Type 2 diabetes mellitus without complication, without long-term current use of insulin ([place])   [facility] [place], [place], PA-C   7 months ago Acute pansinusitis, recurrence not specified   [employer], Dani Gobble, PA-C   7 months ago [place], subsequent encounter   [employer], Jake Church, DO      Future Appointments            In 4 weeks [place], Letitia Libra, PA-C [employer], [place]   In 1 month [facility], Tami Lin, [place]. [place]   In 4 months Troy Sine, MD [place]. [place]

## 2022-01-21 NOTE — Progress Notes (Signed)
                Jearldean Gutt, LCSW 
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
## 2022-01-25 ENCOUNTER — Ambulatory Visit (INDEPENDENT_AMBULATORY_CARE_PROVIDER_SITE_OTHER): Payer: Medicare Other | Admitting: Clinical

## 2022-01-25 DIAGNOSIS — F330 Major depressive disorder, recurrent, mild: Secondary | ICD-10-CM | POA: Diagnosis not present

## 2022-01-25 DIAGNOSIS — F411 Generalized anxiety disorder: Secondary | ICD-10-CM | POA: Diagnosis not present
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
## 2022-01-27 NOTE — Addendum Note (Signed)
Addended by: Angelena Form on: 01/27/2022 04:28 PM   Modules accepted: Orders

## 2022-02-04 ENCOUNTER — Other Ambulatory Visit: Payer: Self-pay | Admitting: Physician Assistant

## 2022-02-05 ENCOUNTER — Ambulatory Visit (INDEPENDENT_AMBULATORY_CARE_PROVIDER_SITE_OTHER): Payer: Medicare Other | Admitting: Clinical

## 2022-02-05 DIAGNOSIS — F411 Generalized anxiety disorder: Secondary | ICD-10-CM | POA: Diagnosis not present

## 2022-02-05 DIAGNOSIS — F330 Major depressive disorder, recurrent, mild: Secondary | ICD-10-CM | POA: Diagnosis not present

## 2022-02-05 NOTE — Progress Notes (Signed)
[place] Counselor/Therapist Progress Note  Patient ID: Abigail Figueroa, MRN: [id number]    Date: 02/05/22  Time Spent: 12:32  pm - 1:22 pm : 50 Minutes  Treatment Type: Individual Therapy.  Reported Symptoms: Patient reported a "low" mood today  Mental Status Exam: Appearance:  Neat     Behavior: Appropriate  Motor: Normal  Speech/Language:  Clear and Coherent  Affect: Appropriate  Mood: Patient reported a "low" mood  Thought process: normal  Thought content:   WNL  Sensory/Perceptual disturbances:   WNL  Orientation: oriented to person, place, and situation  Attention: Good  Concentration: Good  Memory: WNL  Fund of knowledge:  Good  Insight:   Good  Judgment:  Good  Impulse Control: Good   Risk Assessment: Danger to Self:  No Patient denied current suicidal ideation Self-injurious Behavior: No Danger to Others: No Patient denied current homicidal ideation Duty to Warn:no Physical Aggression / Violence:No  Access to Firearms a concern: No  Gang Involvement:No   Subjective:  Patient stated, "its been going" since last session. Patient reported her brother is no longer communicating with patient in response to patient's text expressing her thoughts/feelings. Patient reported her brother denied having an affair. Patient reported the conflict with her brother has impacted her spirtuality and as result she plans to only attend Sunday school going forward. Patient reported not speaking  with her brother is hurtful but she is accepting of the situation and realizes she can't change the situation. Patient reported feeling the symptoms of anxiety have control over patient and she would like to learn ways to cope with stressors. Patient reported her brother's response to patient's text message triggered feelings related previous trauma. Patient reported she would like to get symptoms of depression and anxiety under control and feels she has to learn to let some  things go.   Interventions: Motivational Interviewing Clinician conducted session via Webex video from clinician's home office. Patient provided verbal consent to proceed with telehealth session and participated in session from patient's home. Reviewed strategies discussed during last session in response to conflict with patient's brother, the outcome, and the impact on patient's thoughts/feelings. Validated patient's efforts to express her thoughts and feelings to her brother. Clinician utilized motivational interviewing to explore potential goals for therapy. Clinician utilized a task centered approach in collaboration with patient to develop goals for therapy.   Diagnosis:  Mild episode of recurrent major depressive disorder (HCC)  Generalized anxiety disorder   Plan: Patient is to utilize [facility], thought re-framing, relaxation techniques, mindfulness, effective communication, and coping strategies to decrease symptoms associated with Major Depressive Disorder and Generalized Anxiety Disorder. Frequency: bi-weekly  Modality: individual     Long-term goal:   Patient stated, "I want to see a change in the anxiety".    Reduce overall level, frequency, and intensity of the symptoms of anxiety and depression as evidenced by decreased sadness, feelings of anxiety, feeling "lost", lack of energy, difficulty falling and staying asleep, changes in concentration, psychomotor retardation, rapid heart rate, excessive worry, restlessness, feeling jittery, and feeling on edge from 5 to 6 days per week to 0 to 1 days per week per patient reported for at least 3 consecutive months.   Target Date: 02/06/23  Progress: progressing   Short-term goal:  Develop coping strategies to utilize in response to symptoms of depression/anxiety and stressors  Target Date: 08/07/22  Progress: progressing   Verbally express patient's thoughts and feelings to others and utilize effective  communication strategies when expressing patient's thoughts/feelings  Target Date: 08/07/22  Progress: progressing   Verbalize an understanding of the relationship between symptoms of anxiety and the impact on patient's thought patterns and behaviors  Target Date: 08/07/22  Progress: progressing                   Katherina Right, LCSW

## 2022-02-15 ENCOUNTER — Ambulatory Visit (INDEPENDENT_AMBULATORY_CARE_PROVIDER_SITE_OTHER): Payer: Medicare Other | Admitting: Clinical

## 2022-02-15 DIAGNOSIS — F411 Generalized anxiety disorder: Secondary | ICD-10-CM | POA: Diagnosis not present

## 2022-02-15 DIAGNOSIS — F330 Major depressive disorder, recurrent, mild: Secondary | ICD-10-CM | POA: Diagnosis not present

## 2022-02-15 NOTE — Progress Notes (Signed)
                Linzie Boursiquot, LCSW 

## 2022-02-15 NOTE — Progress Notes (Addendum)
[place] Counselor/Therapist Progress Note  Patient ID: Abigail Figueroa, MRN: [id number],    Date: 02/15/2022  Time Spent: 12:30pm -  1:17pm : 47 minutes  Treatment Type: Individual Therapy  Reported Symptoms: Patient reported experiencing depressed mood in response to recent conflict with husband.   Mental Status Exam: Appearance:  Neat     Behavior: Appropriate  Motor: Normal  Speech/Language:  Clear and Coherent  Affect: Tearful when discussing conflict with husband  Mood: Patient stated, "I feel pretty good"  Thought process: normal  Thought content:   WNL  Sensory/Perceptual disturbances:   WNL  Orientation: oriented to person, place, and situation  Attention: Good  Concentration: Good  Memory: WNL  Fund of knowledge:  Good  Insight:   Good  Judgment:  Good  Impulse Control: Good   Risk Assessment: Danger to Self:  No Patient denied current suicidal ideation Self-injurious Behavior: No Danger to Others: No Patient denied current homicidal ideation Duty to Warn:no Physical Aggression / Violence:No  Access to Firearms a concern: No  Gang Involvement:No   Subjective: Patient stated, "things have been going pretty good". Patient reported since last session she has been talking to her brother and his family. Patient reported after she expressed her thoughts/feelings to her brother she continued to speak to her brother and hug her brother when she saw him at church. Patient reported she sent a text message to her brother about a matter at church which prompted their recent communication. Patient stated, "I'm not worrying and am praying" in response to the situation with her brother. Patient stated, "I actually felt happy" in response to the recent changes with brother and his family. Patient identified challenges in communication with her husband as a current stressor. Patient reported she told her daughter "if the Reita Cliche takes me home I'm fine with it" in  response to recent communication with her husband. Patient denied thoughts of harming herself and denied plan or intent as it relates to patient's statement to her daughter. Patient reported feeling she has to be specific in what she says to her husband and reported she feels she has to have a witness to their conversations. Patient reported she talks to her daughter as a support. Patient reported her husband is open to participating in martial counseling.   Interventions: Cognitive Behavioral Therapy. Clinician conducted session via Webex video from clinician's home office. Patient provided verbal consent to proceed with telehealth session and participated in session from patient's home. Discussed recent changes in patient's relationship and communication with patient's brother and his family, and identified the impact on patient's thoughts/mood. Reviewed recommendation for martial counseling and husband's response to recommendation. Provided psycho education related to marital counseling, depressive symptoms, and maintaining a thought record. Clinician requested patient complete thought record for homework.     Diagnosis:  Mild episode of recurrent major depressive disorder (HCC)   Generalized anxiety disorder     Plan: Patient is to utilize [facility], thought re-framing, relaxation techniques, mindfulness, effective communication, and coping strategies to decrease symptoms associated with Major Depressive Disorder and Generalized Anxiety Disorder. Frequency: bi-weekly  Modality: individual      Long-term goal:   Patient stated, "I want to see a change in the anxiety".      Reduce overall level, frequency, and intensity of the symptoms of anxiety and depression as evidenced by decreased sadness, feelings of anxiety, feeling "lost", lack of energy, difficulty falling and staying asleep, changes in concentration, psychomotor retardation, rapid  heart rate, excessive worry,  restlessness, feeling jittery, and feeling on edge from 5 to 6 days per week to 0 to 1 days per week per patient reported for at least 3 consecutive months.    Target Date: 02/06/23  Progress: progressing    Short-term goal:  Develop coping strategies to utilize in response to symptoms of depression/anxiety and stressors   Target Date: 08/07/22  Progress: progressing    Verbally express patient's thoughts and feelings to others and utilize effective communication strategies when expressing patient's thoughts/feelings   Target Date: 08/07/22  Progress: progressing    Verbalize an understanding of the relationship between symptoms of anxiety and the impact on patient's thought patterns and behaviors   Target Date: 08/07/22  Progress: progressing              Katherina Right, LCSW

## 2022-02-18 ENCOUNTER — Ambulatory Visit: Payer: Medicare Other | Admitting: Physician Assistant

## 2022-02-18 ENCOUNTER — Other Ambulatory Visit: Payer: Self-pay | Admitting: Cardiovascular Disease

## 2022-02-18 ENCOUNTER — Other Ambulatory Visit: Payer: Self-pay | Admitting: Family Medicine

## 2022-02-18 ENCOUNTER — Telehealth (INDEPENDENT_AMBULATORY_CARE_PROVIDER_SITE_OTHER): Payer: Medicare Other | Admitting: Physician Assistant

## 2022-02-18 DIAGNOSIS — J302 Other seasonal allergic rhinitis: Secondary | ICD-10-CM

## 2022-02-18 DIAGNOSIS — F331 Major depressive disorder, recurrent, moderate: Secondary | ICD-10-CM | POA: Diagnosis not present

## 2022-02-18 DIAGNOSIS — N183 Chronic kidney disease, stage 3 unspecified: Secondary | ICD-10-CM

## 2022-02-18 DIAGNOSIS — F411 Generalized anxiety disorder: Secondary | ICD-10-CM

## 2022-02-18 DIAGNOSIS — E119 Type 2 diabetes mellitus without complications: Secondary | ICD-10-CM | POA: Diagnosis not present

## 2022-02-18 DIAGNOSIS — R600 Localized edema: Secondary | ICD-10-CM

## 2022-02-18 DIAGNOSIS — G40909 Epilepsy, unspecified, not intractable, without status epilepticus: Secondary | ICD-10-CM

## 2022-02-18 DIAGNOSIS — E669 Obesity, unspecified: Secondary | ICD-10-CM | POA: Diagnosis not present

## 2022-02-18 NOTE — Progress Notes (Signed)
MyChart Video Visit    Virtual Visit via Video Note   This visit type was conducted due to national recommendations for restrictions regarding the COVID-19 Pandemic (e.g. social distancing) in an effort to limit this patient's exposure and mitigate transmission in our community. This patient is at least at moderate risk for complications without adequate follow up. This format is felt to be most appropriate for this patient at this time. Physical exam was limited by quality of the video and audio technology used for the visit.   Patient location: home Provider location: [facility]  I discussed the limitations of evaluation and management by telemedicine and the availability of in person appointments. The patient expressed understanding and agreed to proceed.  Patient: Abigail Figueroa   DOB: 11-03-1960   61 y.o. Female  MRN: [id number] Visit Date: 02/18/2022  Today's healthcare provider: [name], PA-C  CC: FU for her chronic conditions  Subjective    HPI   DMII BS in 80s,90s. This morning her fasting glucose was 120 Adheres to low carb healthy diet Walk with her dog daily  HTN Her BP within normal limits. Most recent was 117/80s Denies having headache, blurry/double vision, chest pain, SOB, palpitations Adheres to low salt diet Drinks plenty of water. Her fluids' intake and output of urine are close to equal?  Pt was referred to [facility] counseling for anxiety and depression. Pt is very happy with her current counselor Was seen by ophthalmology on 12/09/21 . See notes in chart   Medications: Outpatient Medications Prior to Visit  Medication Sig   amLODipine (NORVASC) 5 MG tablet TAKE 1.5 TABLETS BY MOUTH EVERY DAY   Azelastine HCl 0.15 % SOLN Place 2 sprays into both nostrils 2 (two) times daily.   baclofen (LIORESAL) 10 MG tablet Take 10 mg by mouth 2 (two) times daily as needed for muscle spasms.    BOTOX 100 UNITS SOLR injection Inject into the muscle every 3 (three)  months.    busPIRone (BUSPAR) 15 MG tablet TAKE 1 TABLET BY MOUTH 2 TIMES DAILY.   chlorproMAZINE (THORAZINE) 25 MG tablet Take 50 mg by mouth 2 (two) times daily.   clindamycin (CLEOCIN T) 1 % lotion APPLY TOPICALLY ONCE A DAY TO AREAS OF ACNE ON THE FACE   diclofenac Sodium (VOLTAREN) 1 % GEL Apply 4 g topically 4 (four) times daily.   EMGALITY 120 MG/ML SOAJ Inject into the skin as needed.   EPINEPHrine 0.3 mg/0.3 mL IJ SOAJ injection Inject 0.3 mLs (0.3 mg total) into the muscle as needed for up to 1 dose for anaphylaxis (Call 911 if used.).   ezetimibe (ZETIA) 10 MG tablet TAKE 1 TABLET (10 MG TOTAL) BY MOUTH DAILY. PLEASE SCHEDULE APPOINTMENT WITH DR. Claiborne Billings FOR REFILLS.   fluocinonide ointment (LIDEX) 1.02 % Apply 1 application topically 2 (two) times daily.   fluticasone (FLONASE) 50 MCG/ACT nasal spray Place 2 sprays into both nostrils in the morning and at bedtime.   furosemide (LASIX) 40 MG tablet Take 1 tablet (40 mg total) by mouth daily.   glucose blood (ONETOUCH VERIO) test strip TEST FASTING GLUCOSE DAILY AS DIRECTED   hydrocortisone 1 % ointment Apply 1 application topically 2 (two) times daily. To areas of itching not to use if any blisters or open wounds. USE PRN itching.   lamoTRIgine (LAMICTAL) 100 MG tablet Take 100 mg by mouth 2 (two) times daily.   Lancets (ONETOUCH ULTRASOFT) lancets Test fasting each morning and 2 hours before supper.  Retest if having hypoglycemic symptoms.   levocetirizine (XYZAL) 5 MG tablet TAKE 1 TABLET BY MOUTH EVERY DAY AS NEEDED   metFORMIN (GLUCOPHAGE) 500 MG tablet TAKE 2 TABLETS BY MOUTH IN THE MORNING ,1 TABLET AT LUNCH, & TAKE 2 TABLETS IN THE EVENING   metoprolol tartrate (LOPRESSOR) 100 MG tablet Take 1 tablet (100 mg total) by mouth 2 (two) times daily.   promethazine (PHENERGAN) 25 MG tablet Take 25 mg by mouth every 4 (four) hours as needed.   rosuvastatin (CRESTOR) 20 MG tablet TAKE 1 TABLET BY MOUTH EVERY DAY   RYBELSUS 3 MG TABS TAKE 1  TABLET BY MOUTH EVERY DAY   TEGRETOL-XR 400 MG 12 hr tablet TAKE 1 TABLET BY MOUTH TWICE A DAY   Vilazodone HCl (VIIBRYD) 40 MG TABS TAKE 1 TABLET BY MOUTH EVERY DAY   No facility-administered medications prior to visit.    Review of Systems  All other systems reviewed and are negative. Except see HPI     Objective    LMP  (LMP Unknown)      Physical Exam Constitutional:      General: She is not in acute distress.    Appearance: Normal appearance.  HENT:     Head: Normocephalic.  Pulmonary:     Effort: Pulmonary effort is normal. No respiratory distress.  Neurological:     Mental Status: She is alert and oriented to person, place, and time. Mental status is at baseline.        Assessment & Plan       1. Type 2 diabetes mellitus without complication, without long-term current use of insulin (HCC) Chronic and stable Continue current regimen Pt is on a multidrug therapy and requested to start Ozempic injections weekly vs taking Rybelsus daily Will contact our pharmacist Will refill Rybelsus next week and Rx ozempic. It might take a mo before pt will able to start ozempic depending on her insurance. Continue lifestyle modifications. Her morning BS was 120 today   2. Obesity (BMI 30-39.9) Her current weight 196-195 Continue weight loss via healthy diet and daily exercise   3. Generalized anxiety disorder Chronic and stable Continue current regimen Started seeing Counseling, was referred to see [facility] and marriage counseling Will request notes from Counseling Unclear if I should place a referral to [facility] vs it was placed by counseling   4. Depression, major, recurrent, moderate (HCC) Chronic and stable Continue current regimen   5. Seizure ([employer]) Chronic and stable, on Tegretol - Carbamazepine Level (Tegretol), total Not managed by Neurology Referral to neurology was placed but pt did not see Neurology yet Will recheck if she was able to establish care with  Neurology Might need to repeat carbamazepine level  6. Leg swelling Chronic and stable Pt has been watching her weight and her liquids Denies having worsening of her current leg swelling. Pt was seen by Cardiology on 01/07/2022, recommended to continue amlodipine  7.5 mg, furosemide 40 mg daily, metoprolol 100 mg twice a day.  She started to use CPAP therapy again/ now with new mask  Recommended support stockings  Denies having chest pain or SOB   7. CKD stage 3  Reviewed labs results over the last year and reassured patient that his renal functions have been very consistent. She needs to drink 8-12 glasses of water every day, avoid NSAIDs, and have renal functions check at the next appt     The patient was advised to call back or seek an in-person evaluation  if the symptoms worsen or if the condition fails to improve as anticipated.    The entirety of the information documented in the History of Present Illness, Review of Systems and Physical Exam were personally obtained by me. Portions of this information were initially documented by the CMA and reviewed by me for thoroughness and accuracy.   Portions of this note were created using dictation software and may contain typographical errors.     I discussed the assessment and treatment plan with the patient. The patient was provided an opportunity to ask questions and all were answered. The patient agreed with the plan and demonstrated an understanding of the instructions.   The patient was advised to call back or seek an in-person evaluation if the symptoms worsen or if the condition fails to improve as anticipated.  I provided 19 minutes of non-face-to-face time during this encounter.  [name], PA-C [facility] [phone] (phone) [phone] (fax)  [place]

## 2022-02-22 ENCOUNTER — Telehealth: Payer: Self-pay | Admitting: Physician Assistant

## 2022-02-22 NOTE — Telephone Encounter (Signed)
She can do a virtual visit. Please ask her to have a BP cuff handy.

## 2022-02-22 NOTE — Progress Notes (Signed)
Virtual Visit via Video Note   Because of [name]'s co-morbid illnesses, she is at least at moderate risk for complications without adequate follow up.  This format is felt to be most appropriate for this patient at this time.  All issues noted in this document were discussed and addressed.  A limited physical exam was performed with this format.  Please refer to the patient's chart for her consent to telehealth for [facility].     Date:  02/23/2022   ID:  Abigail Figueroa, DOB [birth year], MRN [id number] The patient was identified using 2 identifiers.  Patient Location: Home Provider Location: Office/Clinic   PCP:  [name], [place] Prov[facility] Cardiologist:  Shelva Majestic, MD Cardiology APP:  Ledora Bottcher, [place]     Evaluation Performed:  Follow-Up Visit  Chief Complaint:  lower extremity swelling  History of Present Illness:    Abigail Figueroa is a 61 y.o. female with a hx of hypertension, palpitations, hyperlipidemia, OSA on CPAP, seizure disorder, DM 2, and anemia.   Heart catheterization 08/2015 with normal coronaries and normal LV function.  This was in response to an abnormal stress test most likely due to body habitus.  Unfortunately she stopped using her CPAP machine because it started making a loud noise.  She was seen 01/09/21 with Coletta Memos NP. She reported chest discomfort and arm pain at rest that sounded atypical. No ischemic evaluation planned. I saw her in follow up 01/07/22 with better compliance on CPAP but increased sodium in her diet leading to DOE, weight gain, and lower extremity edema.  I advised to reduce salt and increase lasix to 40 / 20 mg x 5 days then back to 40 mg daily.   She presents back for follow up. I connected via virtual visit due to transportation problems.  Her swelling is better in the morning and worsens throughout the day. DOE is improved. Weight has gone down 195.4 lbs. She is moving in the  right direction, but still with baseline LE swelling. She has dramatically improved her sodium intake.    Past Medical History:  Diagnosis Date   Abnormal stress test    a. 10/2009: Normal myocardial perfusion imaging; b. 08/2015 MV: medium defect of mod severity in mid ant apical region w/ mild HK of the distal inf wall;  c. 08/2015 Cath: nl Cors, EF 60%.   Anemia    Angio-edema    Anxiety    Arthritis    Bell's palsy    Depression    Edema    Essential hypertension    Frequent urination    Frequent urination at night    Headache    migraines, gets botox injections   Heart palpitations June 2013   Event monitor showing sinus tachycardia, PACs with couplets and triplets.   Hx of echocardiogram    a. 10/2009 Echo: showed normal left ventricular function, mild left ventricular hypertrophy, and no significant valve abnormalities.   Hypercholesterolemia    Migraine    Morbid obesity ([place])    Neuromuscular disorder (HCC)    OSA (obstructive sleep apnea)    has been on continuous positive airway pressure   Seizure disorder ([place])    Seizures ([place])    Type II diabetes mellitus ([place])    Urticaria    Past Surgical History:  Procedure Laterality Date   ABDOMINAL HYSTERECTOMY  1990   without BSO   BACK SURGERY  1900s 2001  x2 with "cage put in"   [place] N/A 08/06/2015   Procedure: Left Heart Cath and Coronary Angiography;  Surgeon: Troy Sine, MD;  Location: [place] [facility];  Service: Cardiovascular;  Laterality: N/A;   CARPAL TUNNEL RELEASE Bilateral    CHOLECYSTECTOMY  1980   COLONOSCOPY WITH PROPOFOL N/A 08/30/2017   Procedure: COLONOSCOPY WITH PROPOFOL;  Surgeon: Lin Landsman, MD;  Location: [facility] ENDOSCOPY;  Service: Endoscopy;  Laterality: N/A;   DG THUMB LEFT HAND  01/24/2018   repair joint in left thumb   DORSAL COMPARTMENT RELEASE Left 10/25/2014   Procedure: LEFT FIRST  DORSAL COMPARTMENT RELEASE AND RADIAL  TENOSYNOVECTOMY ;  Surgeon: Roseanne Kaufman, MD;  Location: [place];  Service: Orthopedics;  Laterality: Left;   HAND SURGERY     KNEE SURGERY Right 2012   meniscus tear   KNEE SURGERY  2012   LAMINECTOMY  1995   TUBAL LIGATION  1982   WRIST SURGERY Right      Current Meds  Medication Sig   amLODipine (NORVASC) 5 MG tablet TAKE 1.5 TABLETS BY MOUTH EVERY DAY   Azelastine HCl 0.15 % SOLN Place 2 sprays into both nostrils 2 (two) times daily.   baclofen (LIORESAL) 10 MG tablet Take 10 mg by mouth 2 (two) times daily as needed for muscle spasms.    BOTOX 100 UNITS SOLR injection Inject into the muscle every 3 (three) months.    busPIRone (BUSPAR) 15 MG tablet TAKE 1 TABLET BY MOUTH 2 TIMES DAILY.   chlorproMAZINE (THORAZINE) 25 MG tablet Take 50 mg by mouth 2 (two) times daily.   clindamycin (CLEOCIN T) 1 % lotion APPLY TOPICALLY ONCE A DAY TO AREAS OF ACNE ON THE FACE   diclofenac Sodium (VOLTAREN) 1 % GEL Apply 4 g topically 4 (four) times daily.   EMGALITY 120 MG/ML SOAJ Inject into the skin as needed.   EPINEPHrine 0.3 mg/0.3 mL IJ SOAJ injection Inject 0.3 mLs (0.3 mg total) into the muscle as needed for up to 1 dose for anaphylaxis (Call 911 if used.).   ezetimibe (ZETIA) 10 MG tablet Take 1 tablet (10 mg total) by mouth daily.   fluocinonide ointment (LIDEX) 9.38 % Apply 1 application topically 2 (two) times daily.   fluticasone (FLONASE) 50 MCG/ACT nasal spray Place 2 sprays into both nostrils in the morning and at bedtime.   furosemide (LASIX) 40 MG tablet Take 1 tablet (40 mg total) by mouth daily.   glucose blood (ONETOUCH VERIO) test strip TEST FASTING GLUCOSE DAILY AS DIRECTED   hydrocortisone 1 % ointment Apply 1 application topically 2 (two) times daily. To areas of itching not to use if any blisters or open wounds. USE PRN itching.   lamoTRIgine (LAMICTAL) 100 MG tablet Take 100 mg by mouth 2 (two) times daily.   Lancets (ONETOUCH ULTRASOFT) lancets Test  fasting each morning and 2 hours before supper. Retest if having hypoglycemic symptoms.   levocetirizine (XYZAL) 5 MG tablet TAKE 1 TABLET BY MOUTH EVERY DAY AS NEEDED   metFORMIN (GLUCOPHAGE) 500 MG tablet TAKE 2 TABLETS BY MOUTH IN THE MORNING ,1 TABLET AT LUNCH, & TAKE 2 TABLETS IN THE EVENING   metoprolol tartrate (LOPRESSOR) 100 MG tablet TAKE 1 TABLET BY MOUTH TWICE A DAY   promethazine (PHENERGAN) 25 MG tablet Take 25 mg by mouth every 4 (four) hours as needed.   rosuvastatin (CRESTOR) 20 MG tablet TAKE 1 TABLET BY  MOUTH EVERY DAY   RYBELSUS 3 MG TABS TAKE 1 TABLET BY MOUTH EVERY DAY   TEGRETOL-XR 400 MG 12 hr tablet TAKE 1 TABLET BY MOUTH TWICE A DAY   Vilazodone HCl (VIIBRYD) 40 MG TABS TAKE 1 TABLET BY MOUTH EVERY DAY     Allergies:   Morphine, Morphine and related, Oxycodone, Oxycodone hcl, Aimovig [erenumab-aooe], Augmentin [amoxicillin-pot clavulanate], and Morphine sulfate   Social History   Tobacco Use   Smoking status: Former    Types: Cigarettes    Quit date: 01/03/1983    Years since quitting: 39.1   Smokeless tobacco: Never   Tobacco comments:    quit in 1984  Vaping Use   Vaping Use: Never used  Substance Use Topics   Alcohol use: No    Alcohol/week: 0.0 standard drinks of alcohol   Drug use: No     Family Hx: The patient's family history includes Alzheimer's disease in her maternal grandmother; Dementia in her maternal grandmother; Diabetes in her maternal grandfather; Heart attack in her mother; Hypertension in her maternal grandfather; Sarcoidosis in her mother; Seizures in her mother. There is no history of Breast cancer, Allergic rhinitis, Asthma, Eczema, or Urticaria.  ROS:   Please see the history of present illness.     All other systems reviewed and are negative.   Prior CV studies:   The following studies were reviewed today:  [place] 2017: The left ventricular systolic function is normal.   Normal coronary arteries. Normal LV function with an  ejection fraction of at least 60%.   RECOMMENDATION: The patient's stress test is a false positive study, most likely due to her morbid obesity and body habitus.  She has normal coronary arteries and normal LV function.  She is given preoperative clearance for her planned lumbar surgery to be done by Dr. Arnoldo Morale.  Labs/Other Tests and Data Reviewed:    EKG:  No ECG reviewed.  Recent Labs: 11/05/2021: ALT 17; BUN 21; Creatinine, Ser 1.51; Hemoglobin 9.9; Platelets 244; Potassium 5.4; Sodium 142; TSH 2.560   Recent Lipid Panel Lab Results  Component Value Date/Time   CHOL 132 11/05/2021 09:24 AM   CHOL 277 (H) 08/16/2014 08:43 AM   TRIG 100 11/05/2021 09:24 AM   TRIG 167 (H) 08/16/2014 08:43 AM   HDL 73 11/05/2021 09:24 AM   HDL 77 08/16/2014 08:43 AM   CHOLHDL 1.8 11/05/2021 09:24 AM   CHOLHDL 2.8 02/27/2019 08:18 AM   LDLCALC 41 11/05/2021 09:24 AM   LDLCALC 190 (H) 02/28/2017 10:09 AM   LDLCALC 167 (H) 08/16/2014 08:43 AM    Wt Readings from Last 3 Encounters:  02/23/22 195 lb 6.4 oz (88.6 kg)  01/07/22 204 lb (92.5 kg)  12/16/21 201 lb 8 oz (91.4 kg)     Risk Assessment/Calculations:          Objective:    Vital Signs:  BP 115/68 (BP Location: Left Arm, Patient Position: Sitting)   Pulse 62   Ht '5\' 3"'$  (1.6 m)   Wt 195 lb 6.4 oz (88.6 kg)   LMP  (LMP Unknown)   BMI 34.61 kg/m    VITAL SIGNS:  reviewed GEN:  no acute distress EYES:  sclerae anicteric, EOMI - Extraocular Movements Intact RESPIRATORY:  normal respiratory effort, symmetric expansion CARDIOVASCULAR:  lower extremity edema noted MUSCULOSKELETAL:  no obvious deformities. NEURO:  alert and oriented x 3, no obvious focal deficit PSYCH:  normal affect  ASSESSMENT & PLAN:    Lower extremity edema  DOE Weight gain Dietary indiscretion related to sodium Improved on short course of increased lasix, but still with LE swelling. No resting dyspnea or orthopnea, she is complaint on CPAP. I plan to obtain an  echocardiogram and repeat BMP with BNP. She will continue 40 mg lasix daily and then add 20 mg lasix three times weekly on Mon, Wed, and Friday.    Hypertension - metoprolol, amlodipine, and furosemide   115/68 HR 62   OSA on CPAP Now compliant with new mask   Hyperlipidemia with LDL goal < 70 11/05/2021: Cholesterol, Total 132; HDL 73; LDL Chol Calc (NIH) 41; Triglycerides 100 Clean coronaries by cath in 2017 Continue statin and zetia   Follow up in 4 weeks.     Time:   Today, I have spent 18 minutes with the patient with telehealth technology discussing the above problems.     Medication Adjustments/Labs and Tests Ordered: Current medicines are reviewed at length with the patient today.  Concerns regarding medicines are outlined above.   Tests Ordered: Orders Placed This Encounter  Procedures   Basic metabolic panel   Brain natriuretic peptide   ECHOCARDIOGRAM COMPLETE    Medication Changes: No orders of the defined types were placed in this encounter.   Follow Up:  In Person in 5 week(s)  Signed, Ledora Bottcher, [place]  02/23/2022 12:52 PM    [place]

## 2022-02-22 NOTE — Telephone Encounter (Signed)
New Message:     Patient would like to know if she can do a Virtual Visit tomorrow(02-23-22)? She does not have any transportation.

## 2022-02-22 NOTE — Telephone Encounter (Signed)
Patient informed she may have a virtual visit tomorrow. Explained to expect a call around 11:00 AM; to have her weight, BP, P, and med list available.

## 2022-02-23 ENCOUNTER — Encounter: Payer: Self-pay | Admitting: Physician Assistant

## 2022-02-23 ENCOUNTER — Ambulatory Visit: Payer: Medicare Other | Attending: Physician Assistant | Admitting: Physician Assistant

## 2022-02-23 VITALS — BP 115/68 | HR 62 | Ht 63.0 in | Wt 195.4 lb

## 2022-02-23 DIAGNOSIS — E782 Mixed hyperlipidemia: Secondary | ICD-10-CM | POA: Diagnosis not present

## 2022-02-23 DIAGNOSIS — R600 Localized edema: Secondary | ICD-10-CM | POA: Diagnosis not present

## 2022-02-23 DIAGNOSIS — G4733 Obstructive sleep apnea (adult) (pediatric): Secondary | ICD-10-CM

## 2022-02-23 DIAGNOSIS — R0609 Other forms of dyspnea: Secondary | ICD-10-CM | POA: Diagnosis not present

## 2022-02-23 DIAGNOSIS — I10 Essential (primary) hypertension: Secondary | ICD-10-CM

## 2022-02-23 NOTE — Patient Instructions (Signed)
Medication Instructions:  No Changes *If you need a refill on your cardiac medications before your next appointment, please call your pharmacy*   Lab Work: BMET, BNP If you have labs (blood work) drawn today and your tests are completely normal, you will receive your results only by: [place] (if you have MyChart) OR A paper copy in the mail If you have any lab test that is abnormal or we need to change your treatment, we will call you to review the results.   Testing/Procedures: [address]. Your physician has requested that you have an echocardiogram. Echocardiography is a painless test that uses sound waves to create images of your heart. It provides your doctor with information about the size and shape of your heart and how well your heart's chambers and valves are working. This procedure takes approximately one hour. There are no restrictions for this procedure. Please do NOT wear cologne, perfume, aftershave, or lotions (deodorant is allowed). Please arrive 15 minutes prior to your appointment time.    Follow-Up: At [facility], you and your health needs are our priority.  As part of our continuing mission to provide you with exceptional heart care, we have created designated Provider Care Teams.  These Care Teams include your primary Cardiologist (physician) and Advanced Practice Providers (APPs -  Physician Assistants and Nurse Practitioners) who all work together to provide you with the care you need, when you need it.  We recommend signing up for the patient portal called "MyChart".  Sign up information is provided on this After Visit Summary.  MyChart is used to connect with patients for Virtual Visits (Telemedicine).  Patients are able to view lab/test results, encounter notes, upcoming appointments, etc.  Non-urgent messages can be sent to your provider as well.   To learn more about what you can do with MyChart, go to  [email or web address].    Your next appointment:   1 month(s)  The format for your next appointment:   In Person  Provider:   Fabian Sharp, PA-C

## 2022-02-26 ENCOUNTER — Ambulatory Visit (INDEPENDENT_AMBULATORY_CARE_PROVIDER_SITE_OTHER): Payer: Medicare Other | Admitting: Clinical

## 2022-02-26 DIAGNOSIS — F411 Generalized anxiety disorder: Secondary | ICD-10-CM

## 2022-02-26 DIAGNOSIS — F330 Major depressive disorder, recurrent, mild: Secondary | ICD-10-CM

## 2022-02-26 NOTE — Progress Notes (Signed)
[place] Counselor/Therapist Progress Note  Patient ID: Abigail Figueroa, MRN: [id number],    Date: 02/26/2022  Time Spent: 12:30pm -  1:22pm : 52 minutes  Treatment Type: Individual Therapy  Reported Symptoms: Patient reported feeling depressed at times.   Mental Status Exam: Appearance:  Neat     Behavior: Appropriate  Motor: Normal  Speech/Language:  Clear and Coherent  Affect: Tearful  Mood: depressed  Thought process: normal  Thought content:   WNL  Sensory/Perceptual disturbances:   WNL  Orientation: oriented to person, place, and situation  Attention: Good  Concentration: Good  Memory: WNL  Fund of knowledge:  Good  Insight:   Good  Judgment:  Good  Impulse Control: Good   Risk Assessment: Danger to Self:  No Patient denied current suicidal ideation Self-injurious Behavior: No Danger to Others: No Patient denied current homicidal ideation Duty to Warn:no Physical Aggression / Violence:No  Access to Firearms a concern: No  Gang Involvement:No   Subjective: Patient stated, "things have been going ok".  Patient reported she feels talking with the marriage counselor will be helpful. Patient reported her brother doesn't call her any longer unless she initiates a call or text message. Patient reported she misses talking to her brother daily.   Patient stated, "Im scared were going to go backwards" in response to addressing change in communication with brother and stated, "it hurts". Patient reported worry about her home, her husband, her brother, and stated, "its just so much". Patient reported feeling depressed and "down and out" at times. Patient reported she doesn't like to be in her home and prefers to be outside due to home repairs needed and marital distress. Patient reported her husband's willingness to address home repairs/maintenance changed when her husband was no longer able to continue to drive a truck for a living. Patient reported feeling she  can not discuss finances with her husband and reported financial matters have impacted the way she feels about her marriage. Patient reported she no longer wants to be married to her husband. Patient reported experiencing the thought, "when does it get better for me".  Interventions: Cognitive Behavioral Therapy. Clinician conducted session via Webex video from clinician's home office. Patient provided verbal consent to proceed with telehealth session and participated in session from patient's home. Explored changes in communication between patient and her brother and the impact on patient's thoughts/feelings. Provided psycho education related to the use of positive self talk and recognizing patient's limitations as it relates to changes in her brother's behaviors. Discussed strategies to utilize in response to changes in communication with her brother and symptoms of anxiety/depression. Discussed and identified triggers for depression/anxiety as it relates to patient being in her home. Clinician requested patient continue thought record for homework and practice positive self talk.    Diagnosis:  Mild episode of recurrent major depressive disorder (HCC)   Generalized anxiety disorder     Plan: Patient is to utilize [employer] Therapy, thought re-framing, relaxation techniques, mindfulness, effective communication, and coping strategies to decrease symptoms associated with Major Depressive Disorder and Generalized Anxiety Disorder. Frequency: bi-weekly  Modality: individual      Long-term goal:   Patient stated, "I want to see a change in the anxiety".      Reduce overall level, frequency, and intensity of the symptoms of anxiety and depression as evidenced by decreased sadness, feelings of anxiety, feeling "lost", lack of energy, difficulty falling and staying asleep, changes in concentration, psychomotor retardation, rapid heart rate,  excessive worry, restlessness, feeling jittery, and  feeling on edge from 5 to 6 days per week to 0 to 1 days per week per patient reported for at least 3 consecutive months.    Target Date: 02/06/23  Progress: progressing    Short-term goal:  Develop coping strategies to utilize in response to symptoms of depression/anxiety and stressors   Target Date: 08/07/22  Progress: progressing    Verbally express patient's thoughts and feelings to others and utilize effective communication strategies when expressing patient's thoughts/feelings   Target Date: 08/07/22  Progress: progressing    Verbalize an understanding of the relationship between symptoms of anxiety and the impact on patient's thought patterns and behaviors   Target Date: 08/07/22  Progress: progressing      Katherina Right, LCSW

## 2022-02-26 NOTE — Progress Notes (Signed)
                Sanika Brosious, LCSW 

## 2022-03-12 ENCOUNTER — Ambulatory Visit (INDEPENDENT_AMBULATORY_CARE_PROVIDER_SITE_OTHER): Payer: Medicare Other | Admitting: Clinical

## 2022-03-12 DIAGNOSIS — F411 Generalized anxiety disorder: Secondary | ICD-10-CM

## 2022-03-12 DIAGNOSIS — F330 Major depressive disorder, recurrent, mild: Secondary | ICD-10-CM

## 2022-03-12 NOTE — Progress Notes (Signed)
                Early Steel, LCSW 

## 2022-03-12 NOTE — Progress Notes (Signed)
[facility] Counselor/Therapist Progress Note  Patient ID: Abigail Figueroa, MRN: [id number],    Date: 03/12/2022  Time Spent: 9:30am -  10:22am : 52 minutes  Treatment Type: Individual Therapy  Reported Symptoms: Patient reported depressed mood since last session, but stated, "its pretty good today" in response to current mood.   Mental Status Exam: Appearance:  Well Groomed     Behavior: Appropriate  Motor: Normal  Speech/Language:  Clear and Coherent  Affect: Appropriate  Mood: normal  Thought process: normal  Thought content:   WNL  Sensory/Perceptual disturbances:   WNL  Orientation: oriented to person, place, and situation  Attention: Good  Concentration: Good  Memory: WNL  Fund of knowledge:  Good  Insight:   Good  Judgment:  Good  Impulse Control: Good   Risk Assessment: Danger to Self:  No Patient denied current suicidal ideation Self-injurious Behavior: No Danger to Others: No Patient denied current homicidal ideation Duty to Warn:no Physical Aggression / Violence:No  Access to Firearms a concern: No  Gang Involvement:No   Subjective: Patient stated, "its been a rough road" in regards to the relationship with her brother and his wife since last session. Patient reported she asked her sister in law to assist patient and her daughter in decorating for an upcoming church function and their was conflict with her sister in law in response. Patient stated, "I just broke down and cried" and left church that day in response to conflict with her sister in law. Patient reported she and her brother had a conversation regarding the conflict with her sister-in-law and patient stated, "he got loud with me".  Patient reported during their conversation her brother continued to cut patient off when she was speaking. Patient reported feeling her brother "puts me a fault for everything" and reported feeling her brother "won again". Patient stated, "I need to back away"  in regards to communication with her brother. Patient stated,  "I want peace". Patient reported she would leave after Sunday school if she had transportation. Patient reported difficulty receiving spiritual message from her brother who is also her pastor due to her knowledge of his behaviors. Patient reported she plans to limit communication with her brother and sister-in-law. Patient reported she has been utilizing self talk and replacing negative thoughts with positive thoughts. Patient reported she feels this has been helpful and stated, "I love it".    Interventions: Cognitive Behavioral Therapy. Clinician conducted session via WebEx video from clinician's home office. Patient provided verbal consent to proceed with telehealth session and participated in session from patient's home. Discussed recent conflict with sister in law and brother, and processed patient's response to the conflict. Discussed spiritual conflict patient is experiencing as it relates to the conflict with her brother being her pastor and a family member. Provided psycho education related to thought distortions and countering negative thoughts. Explored, identified, and challenged thought distortion of personalization in response to conflict with brother and sister in law. Discussed establishing healthy boundaries in communication with brother and sister in law. Clinician requested patient continue thought record, use of positive self talk, and countering negative thoughts for homework.     Diagnosis:  Mild episode of recurrent major depressive disorder (HCC)   Generalized anxiety disorder     Plan: Patient is to utilize [employer] Therapy, thought re-framing, relaxation techniques, mindfulness, effective communication, and coping strategies to decrease symptoms associated with Major Depressive Disorder and Generalized Anxiety Disorder. Frequency: bi-weekly  Modality: individual  Long-term goal:   Patient stated,  "I want to see a change in the anxiety".      Reduce overall level, frequency, and intensity of the symptoms of anxiety and depression as evidenced by decreased sadness, feelings of anxiety, feeling "lost", lack of energy, difficulty falling and staying asleep, changes in concentration, psychomotor retardation, rapid heart rate, excessive worry, restlessness, feeling jittery, and feeling on edge from 5 to 6 days per week to 0 to 1 days per week per patient reported for at least 3 consecutive months.    Target Date: 02/06/23  Progress: progressing    Short-term goal:  Develop coping strategies to utilize in response to symptoms of depression/anxiety and stressors   Target Date: 08/07/22  Progress: progressing    Verbally express patient's thoughts and feelings to others and utilize effective communication strategies when expressing patient's thoughts/feelings   Target Date: 08/07/22  Progress: progressing    Verbalize an understanding of the relationship between symptoms of anxiety and the impact on patient's thought patterns and behaviors   Target Date: 08/07/22  Progress: progressing     Katherina Right, LCSW

## 2022-03-19 ENCOUNTER — Ambulatory Visit (INDEPENDENT_AMBULATORY_CARE_PROVIDER_SITE_OTHER): Payer: Medicare Other | Admitting: Clinical

## 2022-03-19 DIAGNOSIS — F411 Generalized anxiety disorder: Secondary | ICD-10-CM

## 2022-03-19 DIAGNOSIS — F330 Major depressive disorder, recurrent, mild: Secondary | ICD-10-CM
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
## 2022-03-19 NOTE — Progress Notes (Signed)
                Joletta Manner, LCSW 

## 2022-03-22 ENCOUNTER — Other Ambulatory Visit: Payer: Self-pay | Admitting: Family Medicine

## 2022-03-22 DIAGNOSIS — F331 Major depressive disorder, recurrent, moderate: Secondary | ICD-10-CM

## 2022-03-23 NOTE — Telephone Encounter (Signed)
Requested Prescriptions  Pending Prescriptions Disp Refills   busPIRone (BUSPAR) 15 MG tablet [Pharmacy Med Name: BUSPIRONE HCL 15 MG TABLET] 180 tablet 0    Sig: TAKE 1 TABLET BY MOUTH TWICE A DAY     Psychiatry: Anxiolytics/Hypnotics - Non-controlled Passed - 03/22/2022 12:31 PM      Passed - Valid encounter within last 12 months    Recent Outpatient Visits           1 month ago Type 2 diabetes mellitus without complication, without long-term current use of insulin ([place])   [facility] [place], [place], PA-C   3 months ago Type 2 diabetes mellitus without complication, without long-term current use of insulin ([place])   [facility] [place], [place], PA-C   4 months ago Type 2 diabetes mellitus without complication, without long-term current use of insulin ([place])   [facility] [place], [place], PA-C   6 months ago Type 2 diabetes mellitus without complication, without long-term current use of insulin ([place])   [employer], [place], PA-C   9 months ago Acute pansinusitis, recurrence not specified   [place], PA-C       Future Appointments             In 2 weeks [facility], Tami Lin, [place]. [place]   In 2 months Troy Sine, MD [place]. [place]

## 2022-03-27 ENCOUNTER — Other Ambulatory Visit: Payer: Self-pay | Admitting: Family Medicine

## 2022-03-27 DIAGNOSIS — F331 Major depressive disorder, recurrent, moderate: Secondary | ICD-10-CM

## 2022-03-29 ENCOUNTER — Telehealth: Payer: Self-pay | Admitting: Physician Assistant

## 2022-03-29 ENCOUNTER — Ambulatory Visit (HOSPITAL_COMMUNITY): Payer: Medicare Other | Attending: Physician Assistant

## 2022-03-29 DIAGNOSIS — R0609 Other forms of dyspnea: Secondary | ICD-10-CM | POA: Diagnosis present

## 2022-03-29 DIAGNOSIS — R600 Localized edema: Secondary | ICD-10-CM | POA: Insufficient documentation

## 2022-03-29 DIAGNOSIS — J302 Other seasonal allergic rhinitis: Secondary | ICD-10-CM

## 2022-03-29 LAB — ECHOCARDIOGRAM COMPLETE
Area-P 1/2: 3.21 cm2
S' Lateral: 3 cm

## 2022-03-29 MED ORDER — LEVOCETIRIZINE DIHYDROCHLORIDE 5 MG PO TABS: 5.0000 mg | ORAL_TABLET | Freq: Every day | ORAL | 3 refills | Status: DC | PRN

## 2022-03-29 NOTE — Telephone Encounter (Signed)
[facility] faxed refill request for the following medications:    Lancets (ONETOUCH ULTRASOFT) lancets    Please advise

## 2022-03-30 ENCOUNTER — Other Ambulatory Visit: Payer: Self-pay | Admitting: Physician Assistant

## 2022-03-30 ENCOUNTER — Other Ambulatory Visit: Payer: Self-pay | Admitting: *Deleted

## 2022-03-30 ENCOUNTER — Ambulatory Visit: Payer: Self-pay | Admitting: *Deleted

## 2022-03-30 DIAGNOSIS — E119 Type 2 diabetes mellitus without complications: Secondary | ICD-10-CM

## 2022-03-30 NOTE — Telephone Encounter (Signed)
Summary: discuss medication   Pt inquiring how long she can go w/o taking RYBELSUS 3 MG TABS due to possibility of refill taking 3bd  Please advise         Chief Complaint: Medication Symptoms: NA Frequency: NA Pertinent Negatives: Patient denies NA Disposition: '[]'$ ED /'[]'$ Urgent Care (no appt availability in office) / '[]'$ Appointment(In office/virtual)/ '[]'$  [facility] [facility]/ '[]'$ Home Care/ '[]'$ Refused Recommended Disposition /'[]'$ [facility]/ '[x]'$  Follow-up with PCP Additional Notes: Pt states she took last dose of Rybelsus today. NT reviewed last OV, unsure if pt is to stay on med as plan is to start Ozempic at some point. [facility] sent refill review via [facility] refill pool. Please advise.  Reason for Disposition  [1] Caller has NON-URGENT medicine question about med that PCP prescribed AND [2] triager unable to answer question  Answer Assessment - Initial Assessment Questions 1. NAME of MEDICINE: "What medicine(s) are you calling about?"     Rybelsus 2. QUESTION: "What is your question?" (e.g., double dose of medicine, side effect)     Should I keep taking, I need a refill if so." 3. PRESCRIBER: "Who prescribed the medicine?" Reason: if prescribed by specialist, call should be referred to that group.    PCP  Protocols used: Medication Question Call-A-AH

## 2022-03-30 NOTE — Telephone Encounter (Signed)
Please advise 

## 2022-03-30 NOTE — Telephone Encounter (Signed)
Requested medication (s) are due for refill today: For review  Requested medication (s) are on the active medication list: yes    Last refill: 03/01/22 according to pharmacy  Future visit scheduled   Notes to clinic: Off protocol. Last OV noted refilled, unsure if pt is to continue med.  Please review.  Requested Prescriptions  Pending Prescriptions Disp Refills   Semaglutide (RYBELSUS) 3 MG TABS 30 tablet 1    Sig: Take 1 tablet by mouth daily.     Off-Protocol Failed - 03/30/2022  4:26 PM      Failed - Medication not assigned to a protocol, review manually.      Passed - Valid encounter within last 12 months    Recent Outpatient Visits           1 month ago Type 2 diabetes mellitus without complication, without long-term current use of insulin ([place])   [facility] [place], [place], PA-C   3 months ago Type 2 diabetes mellitus without complication, without long-term current use of insulin ([place])   [facility] [place], [place], PA-C   5 months ago Type 2 diabetes mellitus without complication, without long-term current use of insulin ([place])   [facility] [place], [place], PA-C   6 months ago Type 2 diabetes mellitus without complication, without long-term current use of insulin ([place])   [facility] [place], [place], PA-C   9 months ago Acute pansinusitis, recurrence not specified   [place], PA-C       Future Appointments             In 1 week [place], Tami Lin, [place]. [place]   In 1 month Claiborne Billings Joyice Faster, MD [place]. [place]

## 2022-03-30 NOTE — Telephone Encounter (Signed)
Requested medications are due for refill today.  yes  Requested medications are on the active medications list.  yes  Last refill. 01/14/2022 #30 1 rf  Future visit scheduled.   no  Notes to clinic.  Medication not assigned to a protocol. Please review for refill.     Requested Prescriptions  Pending Prescriptions Disp Refills   RYBELSUS 3 MG TABS [Pharmacy Med Name: RYBELSUS 3 MG TABLET] 30 tablet 1    Sig: TAKE 1 TABLET BY MOUTH EVERY DAY     Off-Protocol Failed - 03/30/2022  9:34 AM      Failed - Medication not assigned to a protocol, review manually.      Passed - Valid encounter within last 12 months    Recent Outpatient Visits           1 month ago Type 2 diabetes mellitus without complication, without long-term current use of insulin ([place])   [facility] [place], [place], PA-C   3 months ago Type 2 diabetes mellitus without complication, without long-term current use of insulin ([place])   [facility] [place], [place], PA-C   5 months ago Type 2 diabetes mellitus without complication, without long-term current use of insulin ([place])   [facility] [place], [place], PA-C   6 months ago Type 2 diabetes mellitus without complication, without long-term current use of insulin ([place])   [facility] [place], [place], PA-C   9 months ago Acute pansinusitis, recurrence not specified   [place], PA-C       Future Appointments             In 1 week [place], Tami Lin, [place]. [facility] [place]   In 1 month Claiborne Billings Joyice Faster, MD [place]. [place]

## 2022-03-31 ENCOUNTER — Other Ambulatory Visit: Payer: Self-pay | Admitting: Physician Assistant

## 2022-03-31 DIAGNOSIS — E669 Obesity, unspecified: Secondary | ICD-10-CM

## 2022-03-31 DIAGNOSIS — N183 Chronic kidney disease, stage 3 unspecified: Secondary | ICD-10-CM

## 2022-03-31 DIAGNOSIS — E119 Type 2 diabetes mellitus without complications: Secondary | ICD-10-CM

## 2022-03-31 DIAGNOSIS — E7800 Pure hypercholesterolemia, unspecified: Secondary | ICD-10-CM

## 2022-03-31 DIAGNOSIS — E1169 Type 2 diabetes mellitus with other specified complication: Secondary | ICD-10-CM

## 2022-03-31 LAB — BASIC METABOLIC PANEL
BUN/Creatinine Ratio: 13 (ref 12–28)
BUN: 32 mg/dL — ABNORMAL HIGH (ref 8–27)
CO2: 20 mmol/L (ref 20–29)
Calcium: 10.1 mg/dL (ref 8.7–10.3)
Chloride: 110 mmol/L — ABNORMAL HIGH (ref 96–106)
Creatinine, Ser: 2.44 mg/dL — ABNORMAL HIGH (ref 0.57–1.00)
Glucose: 92 mg/dL (ref 70–99)
Potassium: 4.7 mmol/L (ref 3.5–5.2)
Sodium: 146 mmol/L — ABNORMAL HIGH (ref 134–144)
eGFR: 22 mL/min/{1.73_m2} — ABNORMAL LOW (ref >59)

## 2022-03-31 LAB — BRAIN NATRIURETIC PEPTIDE: BNP: 8.6 pg/mL (ref 0.0–100.0)

## 2022-03-31 MED ORDER — OZEMPIC (0.25 OR 0.5 MG/DOSE) 2 MG/3ML ~~LOC~~ SOPN: 0.2500 mg | PEN_INJECTOR | SUBCUTANEOUS | 0 refills | Status: AC

## 2022-03-31 MED ORDER — RYBELSUS 3 MG PO TABS: 1.0000 | ORAL_TABLET | Freq: Every day | ORAL | 1 refills | Status: DC

## 2022-04-01 ENCOUNTER — Telehealth: Payer: Self-pay | Admitting: Physician Assistant

## 2022-04-01 DIAGNOSIS — R7989 Other specified abnormal findings of blood chemistry: Secondary | ICD-10-CM

## 2022-04-01 DIAGNOSIS — Z79899 Other long term (current) drug therapy: Secondary | ICD-10-CM

## 2022-04-01 NOTE — Telephone Encounter (Signed)
Pt calling back for lab results. She reviewed through [facility] however there were questions that she needed to answer

## 2022-04-01 NOTE — Telephone Encounter (Signed)
I am covering for Angie while she is out of the office. In the meantime, she should make sure she is staying hydrated while on the Lasix. Recommend compression stockings to help with swelling as well as limiting salt intake to less than 2,'000mg'$  daily. Otherwise, we can wait for the repeat labs tomorrow and then go from there.   Thank you!

## 2022-04-01 NOTE — Telephone Encounter (Signed)
Tami Lin [place], [place] 03/30/2022  6:11 PM EST     Your renal function is significantly reduced. I do not have all of your labs back, but I think we need to repeat a BMP this Friday. Have you changed any other medications? How are you taking lasix? Are you having any urinary symptoms such as urgency or pain? If BMP still abnormal, we will need to get you in with PCP.    Returned call to patient-patient states she received lab results and wanted to update PA.  Patient reports no change in medication.  She is taking lasix 40 mg daily except on MWF she is taking 60 mg.   Denies urinary symptoms.   Order placed for repeat.

## 2022-04-02 ENCOUNTER — Ambulatory Visit (INDEPENDENT_AMBULATORY_CARE_PROVIDER_SITE_OTHER): Payer: Medicare Other | Admitting: Clinical

## 2022-04-02 DIAGNOSIS — F330 Major depressive disorder, recurrent, mild: Secondary | ICD-10-CM

## 2022-04-02 DIAGNOSIS — F411 Generalized anxiety disorder: Secondary | ICD-10-CM

## 2022-04-02 NOTE — Telephone Encounter (Signed)
Spoke to patient-aware of recommendations and verbalized understanding.  

## 2022-04-02 NOTE — Progress Notes (Signed)
[facility] Counselor/Therapist Progress Note  Patient ID: Abigail Figueroa, MRN: [id number],    Date: 04/02/2022  Time Spent: 12:31pm - 1:24pm : 53 minutes   Treatment Type: Individual Therapy  Reported Symptoms: Patient reported recent improvement in mood throughout the day but reported she wakes up crying.   Mental Status Exam: Appearance:  Well Groomed     Behavior: Appropriate  Motor: Normal  Speech/Language:  Clear and Coherent  Affect: Appropriate  Mood: normal  Thought process: normal  Thought content:   WNL  Sensory/Perceptual disturbances:   WNL  Orientation: oriented to person, place, and situation  Attention: Good  Concentration: Good  Memory: WNL  Fund of knowledge:  Good  Insight:   Good  Judgment:  Good  Impulse Control: Good   Risk Assessment: Danger to Self:  No Patient denied current suicidal ideation Self-injurious Behavior: No Danger to Others: No Patient denied current homicidal ideation Duty to Warn:no Physical Aggression / Violence:No  Access to Firearms a concern: No  Gang Involvement:No   Subjective: Patient stated, "I've been hanging in there, trying to deal with things". Patient reported she has been taking a psychotropic medication at night but recently started taking the medication in the morning. Patient reported since changing the time she takes the medication, she has been waking up crying. Patient reported her mood has improved throughout the day since changing the time she takes the medication.  Patient reported she hasn't spoken to her brother since last session. Patient reported feelings she has done something wrong and stated, "he's faulting me for everything" in response to lack of communication with her brother.  Patient reported she completed the positive qualities survey for homework and her family provided multiple positive responses. Patient stated, "I was shocked" in response to her husband's feedback. Patient stated,  "it makes me feel like Im a loving person, I'm a kind person, I'm a good person", "I'm not a bad person" in responses to the feedback from her daughter and grandson. Patient stated, "it made me feel good, and it made me feel that he is really caring and understanding towards me, and he sees some good in me, and he feels like I'm a good person" in response to husband's feedback. Patient stated,  "that helped me that my family, this is what they thought" in response to completion of the positive qualities survey.    Interventions: Cognitive Behavioral Therapy. Clinician conducted session via WebEx video from clinician's home office. Patient provided verbal consent to proceed with telehealth session and participated in session from patient's home.  Discussed recent change in patient's medication schedule and the impact on patient's symptoms. Clinician recommended patient follow up with her primary care provider to discuss recent change in medication schedule and changes in symptoms as a result per patient's report.  Identified and challenged thought distortions of personalization. Discussed and provided psycho education regarding questions to utilize when challenging negative thoughts/thought distortions.  Provided psycho education related to re-framing thoughts. Reviewed patient's positive qualities survey for homework and discussed patient's response to positive qualities survey. Clinician requested patient continue thought record, use of positive self talk, countering negative thoughts, visualization, and complete additional positive qualities survey responses for homework. Clinician provided referral information for psychiatrists in the area.    Diagnosis:  Mild episode of recurrent major depressive disorder (HCC)   Generalized anxiety disorder   Plan: Patient is to utilize [facility], thought re-framing, relaxation techniques, mindfulness, effective communication, and coping strategies  to decrease symptoms associated with Major Depressive Disorder and Generalized Anxiety Disorder. Frequency: bi-weekly  Modality: individual      Long-term goal:   Patient stated, "I want to see a change in the anxiety".      Reduce overall level, frequency, and intensity of the symptoms of anxiety and depression as evidenced by decreased sadness, feelings of anxiety, feeling "lost", lack of energy, difficulty falling and staying asleep, changes in concentration, psychomotor retardation, rapid heart rate, excessive worry, restlessness, feeling jittery, and feeling on edge from 5 to 6 days per week to 0 to 1 days per week per patient reported for at least 3 consecutive months.    Target Date: 02/06/23  Progress: progressing    Short-term goal:  Develop coping strategies to utilize in response to symptoms of depression/anxiety and stressors   Target Date: 08/07/22  Progress: progressing    Verbally express patient's thoughts and feelings to others and utilize effective communication strategies when expressing patient's thoughts/feelings   Target Date: 08/07/22  Progress: progressing    Verbalize an understanding of the relationship between symptoms of anxiety and the impact on patient's thought patterns and behaviors   Target Date: 08/07/22  Progress: progressing    Katherina Right, LCSW

## 2022-04-02 NOTE — Progress Notes (Signed)
                Luvia Orzechowski, LCSW 

## 2022-04-06 ENCOUNTER — Other Ambulatory Visit: Payer: Self-pay | Admitting: *Deleted

## 2022-04-06 DIAGNOSIS — R7989 Other specified abnormal findings of blood chemistry: Secondary | ICD-10-CM

## 2022-04-06 LAB — BASIC METABOLIC PANEL
BUN/Creatinine Ratio: 11 — ABNORMAL LOW (ref 12–28)
BUN: 28 mg/dL — ABNORMAL HIGH (ref 8–27)
CO2: 17 mmol/L — ABNORMAL LOW (ref 20–29)
Calcium: 9.9 mg/dL (ref 8.7–10.3)
Chloride: 108 mmol/L — ABNORMAL HIGH (ref 96–106)
Creatinine, Ser: 2.46 mg/dL — ABNORMAL HIGH (ref 0.57–1.00)
Glucose: 83 mg/dL (ref 70–99)
Potassium: 4.5 mmol/L (ref 3.5–5.2)
Sodium: 142 mmol/L (ref 134–144)
eGFR: 22 mL/min/{1.73_m2} — ABNORMAL LOW (ref >59)

## 2022-04-06 NOTE — Progress Notes (Signed)
Cardiology Office Note:    Date:  04/12/2022   ID:  Abigail Figueroa, DOB [birth year], MRN [id number]  PCP:  [name], [place] [facility] [facility] Cardiologist:  Shelva Majestic, MD Cardiology APP:  Ledora Bottcher, [place]     Referring MD: [name], PA-C   Chief Complaint  Patient presents with   Follow-up    History of Present Illness:    Abigail Figueroa is a 61 y.o. female with a hx of hypertension, palpitations, hyperlipidemia, OSA on CPAP, seizure disorder, DM 2, and anemia.   Heart catheterization 08/2015 with normal coronaries and normal LV function.  This was in response to an abnormal stress test most likely due to body habitus.  Unfortunately she stopped using her CPAP machine because it started making a loud noise.  She was seen 01/09/21 with Coletta Memos NP. She reported chest discomfort and arm pain at rest that sounded atypical. No ischemic evaluation planned. I saw her in follow up 01/07/22 with better compliance on CPAP but increased sodium in her diet leading to DOE, weight gain, and lower extremity edema.  I advised to reduce salt and increase lasix to 40 / 20 mg x 5 days, then 40 mg daily with an extra 20 mg three times weekly.   Unfortunately, BMP reveals worsening renal function, confirmed on recheck. I advised to reduce lasix to 40 mg daily and to stay hydrated. Will refer to PCP and nephrology. May need to stop lasix for a short time.   She presents back for follow up. Echo was repeated and showed LVEF 60-65%, grade 1 DD, normal RV, and no significant valvular disease.   Repeat sCr 2.46 with CO2 17. With further investigation, she has had diarrhea for 2-3 weeks but reports good hydration. I called and asked her to hold lasix for 2 days, she will see PCP tomorrow morning. Repeat labs with stable renal function and improved CO2 to 19. PCP also stopped metformin last week 04/07/22.   She thinks the the 40 mg lasix is keeping swelling down, not as much  as when she was alternating with 60 mg lasix. She denies orthopnea and PND.  She reports heart racing that is worsening. Heart racing occurs several times per week. She also reports chest pressure that is worse with walking but can occur with resting. She thinks that stress may cause chest discomfort as well.   Low voltage on EKG. Fatigue, diarrhea, palpitations, and lower extremity swelling.  I will draw SPEP/UPEP/and light chains today.   Past Medical History:  Diagnosis Date   Abnormal stress test    a. 10/2009: Normal myocardial perfusion imaging; b. 08/2015 MV: medium defect of mod severity in mid ant apical region w/ mild HK of the distal inf wall;  c. 08/2015 Cath: nl Cors, EF 60%.   Anemia    Angio-edema    Anxiety    Arthritis    Bell's palsy    Depression    Edema    Essential hypertension    Frequent urination    Frequent urination at night    Headache    migraines, gets botox injections   Heart palpitations June 2013   Event monitor showing sinus tachycardia, PACs with couplets and triplets.   Hx of echocardiogram    a. 10/2009 Echo: showed normal left ventricular function, mild left ventricular hypertrophy, and no significant valve abnormalities.   Hypercholesterolemia    Migraine    Morbid obesity ([place])  Neuromuscular disorder (HCC)    OSA (obstructive sleep apnea)    has been on continuous positive airway pressure   Seizure disorder ([place])    Seizures ([place])    Type II diabetes mellitus ([place])    Urticaria     Past Surgical History:  Procedure Laterality Date   ABDOMINAL HYSTERECTOMY  1990   without BSO   BACK SURGERY  1900s 2001   x2 with "cage put in"   [place] N/A 08/06/2015   Procedure: Left Heart Cath and Coronary Angiography;  Surgeon: Troy Sine, MD;  Location: [facility];  Service: Cardiovascular;  Laterality: N/A;   CARPAL TUNNEL RELEASE Bilateral    CHOLECYSTECTOMY  1980   COLONOSCOPY  WITH PROPOFOL N/A 08/30/2017   Procedure: COLONOSCOPY WITH PROPOFOL;  Surgeon: Lin Landsman, MD;  Location: [facility];  Service: Endoscopy;  Laterality: N/A;   DG THUMB LEFT HAND  01/24/2018   repair joint in left thumb   DORSAL COMPARTMENT RELEASE Left 10/25/2014   Procedure: LEFT FIRST  DORSAL COMPARTMENT RELEASE AND RADIAL TENOSYNOVECTOMY ;  Surgeon: Roseanne Kaufman, MD;  Location: [facility];  Service: Orthopedics;  Laterality: Left;   HAND SURGERY     KNEE SURGERY Right 2012   meniscus tear   KNEE SURGERY  2012   LAMINECTOMY  1995   TUBAL LIGATION  1982   WRIST SURGERY Right     Current Medications: Current Meds  Medication Sig   amLODipine (NORVASC) 5 MG tablet TAKE 1.5 TABLETS BY MOUTH EVERY DAY   Azelastine HCl 0.15 % SOLN Place 2 sprays into both nostrils 2 (two) times daily. (Patient taking differently: Place 2 sprays into both nostrils as needed (allergies).)   baclofen (LIORESAL) 10 MG tablet Take 10 mg by mouth 2 (two) times daily as needed (for migraines).   BOTOX 100 UNITS SOLR injection Inject into the muscle every 3 (three) months.    busPIRone (BUSPAR) 15 MG tablet TAKE 1 TABLET BY MOUTH TWICE A DAY   chlorproMAZINE (THORAZINE) 25 MG tablet Take 50 mg by mouth 2 (two) times daily as needed.   clindamycin (CLEOCIN T) 1 % lotion APPLY TOPICALLY ONCE A DAY TO AREAS OF ACNE ON THE FACE   diclofenac Sodium (VOLTAREN) 1 % GEL Apply 4 g topically 4 (four) times daily. (Patient taking differently: Apply 4 g topically as needed.)   EMGALITY 120 MG/ML SOAJ Inject into the skin as needed.   EPINEPHrine 0.3 mg/0.3 mL IJ SOAJ injection Inject 0.3 mLs (0.3 mg total) into the muscle as needed for up to 1 dose for anaphylaxis (Call 911 if used.).   ezetimibe (ZETIA) 10 MG tablet Take 1 tablet (10 mg total) by mouth daily.   fluocinonide ointment (LIDEX) 0.07 % Apply 1 application topically 2 (two) times daily. (Patient taking differently: Apply 1 application   topically as needed.)   fluticasone (FLONASE) 50 MCG/ACT nasal spray Place 2 sprays into both nostrils in the morning and at bedtime. (Patient taking differently: Place 2 sprays into both nostrils as needed for allergies or rhinitis.)   furosemide (LASIX) 40 MG tablet Take 1 tablet (40 mg total) by mouth daily.   glucose blood (ONETOUCH VERIO) test strip TEST FASTING GLUCOSE DAILY AS DIRECTED   hydrocortisone 1 % ointment Apply 1 application topically 2 (two) times daily. To areas of itching not to use if any blisters or open wounds. USE PRN itching.   isosorbide mononitrate (  IMDUR) 30 MG 24 hr tablet Take 0.5 tablets (15 mg total) by mouth every evening.   lamoTRIgine (LAMICTAL) 100 MG tablet Take 100 mg by mouth 2 (two) times daily.   Lancets (ONETOUCH ULTRASOFT) lancets Test fasting each morning and 2 hours before supper. Retest if having hypoglycemic symptoms.   levocetirizine (XYZAL) 5 MG tablet Take 5 mg by mouth every evening.   metoprolol tartrate (LOPRESSOR) 100 MG tablet TAKE 1 TABLET BY MOUTH TWICE A DAY   promethazine (PHENERGAN) 25 MG tablet Take 25 mg by mouth every 4 (four) hours as needed.   rosuvastatin (CRESTOR) 20 MG tablet TAKE 1 TABLET BY MOUTH EVERY DAY   TEGRETOL-XR 400 MG 12 hr tablet TAKE 1 TABLET BY MOUTH TWICE A DAY   Vilazodone HCl (VIIBRYD) 40 MG TABS TAKE 1 TABLET BY MOUTH EVERY DAY     Allergies:   Morphine, Morphine and related, Oxycodone, Oxycodone hcl, Aimovig [erenumab-aooe], Augmentin [amoxicillin-pot clavulanate], and Morphine sulfate   Social History   Socioeconomic History   Marital status: Married    Spouse name: Not on file   Number of children: 2   Years of education: Not on file   Highest education level: 12th grade  Occupational History   Occupation: disabled  Tobacco Use   Smoking status: Former    Types: Cigarettes    Quit date: 01/03/1983    Years since quitting: 39.2   Smokeless tobacco: Never   Tobacco comments:    quit in 1984   Vaping Use   Vaping Use: Never used  Substance and Sexual Activity   Alcohol use: No    Alcohol/week: 0.0 standard drinks of alcohol   Drug use: No   Sexual activity: Never  Other Topics Concern   Not on file  Social History Narrative   ** Merged History Encounter **       She is a married mother of 2, grandmother 3. She tries to get exercise but is not doing any routine program.   She quit smoking over 25 years ago does not drink alcohol.   Social Determinants of Health   Financial Resource Strain: Low Risk  (11/26/2021)   Overall Financial Resource Strain (CARDIA)    Difficulty of Paying Living Expenses: Not hard at all  Food Insecurity: No Food Insecurity (11/26/2021)   Hunger Vital Sign    Worried About Running Out of Food in the Last Year: Never true    Ran Out of Food in the Last Year: Never true  Transportation Needs: Unmet Transportation Needs (11/26/2021)   PRAPARE - Transportation    Lack of Transportation (Medical): Yes    Lack of Transportation (Non-Medical): Yes  Physical Activity: Insufficiently Active (11/26/2021)   Exercise Vital Sign    Days of Exercise per Week: 7 days    Minutes of Exercise per Session: 20 min  Stress: Stress Concern Present (11/26/2021)   [place]    Feeling of Stress : To some extent  Social Connections: Moderately Isolated (11/26/2021)   Social Connection and Isolation Panel [NHANES]    Frequency of Communication with Friends and Family: More than three times a week    Frequency of Social Gatherings with Friends and Family: More than three times a week    Attends Religious Services: Never    Marine scientist or Organizations: No    Attends Archivist Meetings: Never    Marital Status: Married     Family  History: The patient's family history includes Alzheimer's disease in her maternal grandmother; Dementia in her maternal grandmother; Diabetes in her  maternal grandfather; Heart attack in her mother; Hypertension in her maternal grandfather; Sarcoidosis in her mother; Seizures in her mother. There is no history of Breast cancer, Allergic rhinitis, Asthma, Eczema, or Urticaria.  ROS:   Please see the history of present illness.     All other systems reviewed and are negative.  EKGs/Labs/Other Studies Reviewed:    The following studies were reviewed today:  Echo 03/29/22: 1. Left ventricular ejection fraction, by estimation, is 60 to 65%. The  left ventricle has normal function. The left ventricle has no regional  wall motion abnormalities. Left ventricular diastolic parameters are  consistent with Grade I diastolic  dysfunction (impaired relaxation).   2. Right ventricular systolic function is normal. The right ventricular  size is normal. There is normal pulmonary artery systolic pressure. The  estimated right ventricular systolic pressure is 26.8 mmHg.   3. The mitral valve is normal in structure. Trivial mitral valve  regurgitation. No evidence of mitral stenosis.   4. The aortic valve is normal in structure. Aortic valve regurgitation is  not visualized. No aortic stenosis is present.   5. The inferior vena cava is normal in size with greater than 50%  respiratory variability, suggesting right atrial pressure of 3 mmHg.    EKG:  EKG is  ordered today.  The ekg ordered today demonstrates sinus rhythm with HR 62  Recent Labs: 11/05/2021: Hemoglobin 9.9; Platelets 244; TSH 2.560 03/29/2022: BNP 8.6 04/07/2022: ALT 41; BUN 24; Creatinine, Ser 2.50; Potassium 4.8; Sodium 140  Recent Lipid Panel    Component Value Date/Time   CHOL 132 11/05/2021 0924   CHOL 277 (H) 08/16/2014 0843   TRIG 100 11/05/2021 0924   TRIG 167 (H) 08/16/2014 0843   HDL 73 11/05/2021 0924   HDL 77 08/16/2014 0843   CHOLHDL 1.8 11/05/2021 0924   CHOLHDL 2.8 02/27/2019 0818   VLDL 22 02/27/2019 0818   VLDL 33 08/16/2014 0843   LDLCALC 41 11/05/2021  0924   LDLCALC 190 (H) 02/28/2017 1009   LDLCALC 167 (H) 08/16/2014 0843     Risk Assessment/Calculations:                Physical Exam:    VS:  BP 120/68   Pulse 70   Ht '5\' 3"'$  (1.6 m)   Wt 191 lb 9.6 oz (86.9 kg)   LMP  (LMP Unknown)   SpO2 97%   BMI 33.94 kg/m     Wt Readings from Last 3 Encounters:  04/12/22 191 lb 9.6 oz (86.9 kg)  04/07/22 192 lb 12.8 oz (87.5 kg)  02/23/22 195 lb 6.4 oz (88.6 kg)     GEN:  Well nourished, well developed in no acute distress HEENT: Normal NECK: No JVD; No carotid bruits LYMPHATICS: No lymphadenopathy CARDIAC: RRR, no murmurs, rubs, gallops RESPIRATORY:  Clear to auscultation without rales, wheezing or rhonchi  ABDOMEN: Soft, non-tender, non-distended MUSCULOSKELETAL:  No edema; No deformity  SKIN: Warm and dry NEUROLOGIC:  Alert and oriented x 3 PSYCHIATRIC:  Normal affect   ASSESSMENT:    1. Chronic diastolic congestive heart failure (HCC)   2. Palpitations   3. CKD (chronic kidney disease) stage 4, GFR 15-29 ml/min (HCC)   4. Chest pain of uncertain etiology   5. Lower extremity edema   6. Dyspnea on exertion   7. Essential hypertension   8.  Hyperlipidemia: Possible familial    PLAN:    In order of problems listed above:  Palpitations Last 1-2 minutes, several times weekly No associated events No syncope I offered a heart monitor, she would like to focus on her kidneys for now Plan 14 day zio patch at next visit Will check labs today, including TSH and Mg   Chest discomfort Concerning for angina, reassuring heart cath in 2017 following an abnormal nuclear stress test.  I will add 15 mg imdur to be taken at night.  Will hold off on CCTA or heart cath now due to renal function.  Do not want to repeat nuclear stress test given false positive in the past.    Acute on chronic kidney disease sCr baseline is 1.5 sCr now 2.5 with CO2 19 In the setting of lasix and diarrhea (2-3 weeks) Diarrhea is  improving Metformin stopped Lasix reduced to 40 mg daily Will obtain BMP today She has a nephrology appt on Dec 21   Lower extremity edema DOE Weight gain Dietary indiscretion related to sodium Now taking 40 mg lasix daily She is elevating her legs and is doing better with diet.  She appears euvolemic today   Hypertension - metoprolol, amlodipine, and furosemide   - pressure well-controlled - will add 15 mg imdur at night for chest discomfort   OSA on CPAP Now compliant with new mask   Hyperlipidemia with LDL goal < 70 11/05/2021: Cholesterol, Total 132; HDL 73; LDL Chol Calc (NIH) 41; Triglycerides 100 Clean coronaries by cath in 2017 Continue statin and zetia   Follow up with Dr. Claiborne Billings as scheduled.      Medication Adjustments/Labs and Tests Ordered: Current medicines are reviewed at length with the patient today.  Concerns regarding medicines are outlined above.  Orders Placed This Encounter  Procedures   For home use only DME Other see comment   Protein Electrophoresis, (serum)   Protein Electrophoresis, Urine Rflx.   Kappa/lambda light chains   TSH   Magnesium   Basic Metabolic Panel (BMET)   EKG 12-Lead   Meds ordered this encounter  Medications   isosorbide mononitrate (IMDUR) 30 MG 24 hr tablet    Sig: Take 0.5 tablets (15 mg total) by mouth every evening.    Dispense:  30 tablet    Refill:  1    Patient Instructions  Medication Instructions:  Start Imdur 15 mg ( Take 1/2 of a 30 mg Tablet in The Evening). *If you need a refill on your cardiac medications before your next appointment, please call your pharmacy*   Lab Work: BMET, Magnesium Level,TSH, [place], Protein Electrophoresis, Protein Electrophoresis Urine w/Reflex If you have labs (blood work) drawn today and your tests are completely normal, you will receive your results only by: MyChart Message (if you have MyChart) OR A paper copy in the mail If you have any lab test that is  abnormal or we need to change your treatment, we will call you to review the results.   Testing/Procedures: No Testing   Follow-Up: At [facility], you and your health needs are our priority.  As part of our continuing mission to provide you with exceptional heart care, we have created designated Provider Care Teams.  These Care Teams include your primary Cardiologist (physician) and Advanced Practice Providers (APPs -  Physician Assistants and Nurse Practitioners) who all work together to provide you with the care you need, when you need it.  We recommend signing up for the patient  portal called "MyChart".  Sign up information is provided on this After Visit Summary.  MyChart is used to connect with patients for Virtual Visits (Telemedicine).  Patients are able to view lab/test results, encounter notes, upcoming appointments, etc.  Non-urgent messages can be sent to your provider as well.   To learn more about what you can do with MyChart, go to [email or web address].    Your next appointment:   Follow up As Scheduled  The format for your next appointment:   In Person  Provider:   Shelva Majestic, MD      Signed, [place], [place]  04/12/2022 12:15 PM    [place]

## 2022-04-07 ENCOUNTER — Ambulatory Visit (INDEPENDENT_AMBULATORY_CARE_PROVIDER_SITE_OTHER): Payer: Medicare Other | Admitting: Physician Assistant

## 2022-04-07 ENCOUNTER — Encounter: Payer: Self-pay | Admitting: Physician Assistant

## 2022-04-07 VITALS — BP 113/60 | HR 69 | Temp 98.2°F | Resp 16 | Ht 63.0 in | Wt 192.8 lb

## 2022-04-07 DIAGNOSIS — E119 Type 2 diabetes mellitus without complications: Secondary | ICD-10-CM

## 2022-04-07 DIAGNOSIS — N289 Disorder of kidney and ureter, unspecified: Secondary | ICD-10-CM

## 2022-04-07 DIAGNOSIS — R197 Diarrhea, unspecified: Secondary | ICD-10-CM

## 2022-04-07 DIAGNOSIS — N184 Chronic kidney disease, stage 4 (severe): Secondary | ICD-10-CM | POA: Diagnosis not present

## 2022-04-07 DIAGNOSIS — N183 Chronic kidney disease, stage 3 unspecified: Secondary | ICD-10-CM

## 2022-04-07 LAB — POCT URINALYSIS DIPSTICK
Bilirubin, UA: NEGATIVE
Blood, UA: NEGATIVE
Glucose, UA: NEGATIVE
Ketones, UA: NEGATIVE
Leukocytes, UA: NEGATIVE
Nitrite, UA: NEGATIVE
Protein, UA: POSITIVE — AB
Spec Grav, UA: 1.01 (ref 1.010–1.025)
Urobilinogen, UA: 0.2 E.U./dL
pH, UA: 6 (ref 5.0–8.0)

## 2022-04-07 NOTE — Progress Notes (Unsigned)
I,Sulibeya S Dimas,acting as a Education administrator for [employer], PA-C.,have documented all relevant documentation on the behalf of [name], PA-C,as directed by  [employer], PA-C while in the presence of [employer], PA-C.     Established patient visit   Patient: Abigail Figueroa   DOB: Mar 30, 1961   61 y.o. Female  MRN: [id number] Visit Date: 04/07/2022  Today's healthcare provider: [name], PA-C   Chief Complaint  Patient presents with   Follow-up   Subjective    HPI  Follow up for abnormal labs  The patient was last seen for this 2 days ago. Changes made at last visit include per Minette Brine, PA (cardiology)-Given the new information regarding her diarrhea, I have advised to hold lasix for 2 days (Wed and thurs) and to repeat labs with PCP possibly on Friday. Appreciate further input from PCP. She reports good hydration .  Stopped taking Rybelsus last Wednesday Started Ozempic  on Nov 30th/Thursday Developed diarrhea 2-3 weeks ago. However, she has been having chronic diarrhea since 2005. Per pt's spouse who is accompanied pt today, her symptoms worsen since a start of her vegetarian diet. She has been on Metformin since 10/23/19 and Rybelsus since May of this year without any noticeable side effects. Per pt request, she was Rxed Ozempic injection and started on Ozempic last Thursday. Rybelsus helped with BS and weight loss. Her blood sugar at home was between 111 and 70   -----------------------------------------------------------------------------------------   Medications: Outpatient Medications Prior to Visit  Medication Sig   amLODipine (NORVASC) 5 MG tablet TAKE 1.5 TABLETS BY MOUTH EVERY DAY   Azelastine HCl 0.15 % SOLN Place 2 sprays into both nostrils 2 (two) times daily.   baclofen (LIORESAL) 10 MG tablet Take 10 mg by mouth 2 (two) times daily as needed (for migraines).   BOTOX 100 UNITS SOLR injection Inject into the muscle every 3 (three) months.     busPIRone (BUSPAR) 15 MG tablet TAKE 1 TABLET BY MOUTH TWICE A DAY   chlorproMAZINE (THORAZINE) 25 MG tablet Take 50 mg by mouth 2 (two) times daily as needed.   clindamycin (CLEOCIN T) 1 % lotion APPLY TOPICALLY ONCE A DAY TO AREAS OF ACNE ON THE FACE   diclofenac Sodium (VOLTAREN) 1 % GEL Apply 4 g topically 4 (four) times daily. (Patient taking differently: Apply 4 g topically as needed.)   EMGALITY 120 MG/ML SOAJ Inject into the skin as needed.   EPINEPHrine 0.3 mg/0.3 mL IJ SOAJ injection Inject 0.3 mLs (0.3 mg total) into the muscle as needed for up to 1 dose for anaphylaxis (Call 911 if used.).   ezetimibe (ZETIA) 10 MG tablet Take 1 tablet (10 mg total) by mouth daily.   fluocinonide ointment (LIDEX) 6.31 % Apply 1 application topically 2 (two) times daily.   fluticasone (FLONASE) 50 MCG/ACT nasal spray Place 2 sprays into both nostrils in the morning and at bedtime.   furosemide (LASIX) 40 MG tablet Take 1 tablet (40 mg total) by mouth daily.   glucose blood (ONETOUCH VERIO) test strip TEST FASTING GLUCOSE DAILY AS DIRECTED   hydrocortisone 1 % ointment Apply 1 application topically 2 (two) times daily. To areas of itching not to use if any blisters or open wounds. USE PRN itching.   lamoTRIgine (LAMICTAL) 100 MG tablet Take 100 mg by mouth 2 (two) times daily.   Lancets (ONETOUCH ULTRASOFT) lancets Test fasting each morning and 2 hours before supper. Retest if having hypoglycemic symptoms.  levocetirizine (XYZAL) 5 MG tablet Take 1 tablet (5 mg total) by mouth daily as needed.   metFORMIN (GLUCOPHAGE) 500 MG tablet TAKE 2 TABLETS BY MOUTH IN THE MORNING ,1 TABLET AT LUNCH, & TAKE 2 TABLETS IN THE EVENING   metoprolol tartrate (LOPRESSOR) 100 MG tablet TAKE 1 TABLET BY MOUTH TWICE A DAY   promethazine (PHENERGAN) 25 MG tablet Take 25 mg by mouth every 4 (four) hours as needed.   rosuvastatin (CRESTOR) 20 MG tablet TAKE 1 TABLET BY MOUTH EVERY DAY   Semaglutide,0.25 or 0.5MG/DOS,  (OZEMPIC, 0.25 OR 0.5 MG/DOSE,) 2 MG/3ML SOPN Inject 0.25 mg into the skin once a week.   TEGRETOL-XR 400 MG 12 hr tablet TAKE 1 TABLET BY MOUTH TWICE A DAY   Vilazodone HCl (VIIBRYD) 40 MG TABS TAKE 1 TABLET BY MOUTH EVERY DAY   Semaglutide (RYBELSUS) 3 MG TABS Take 1 tablet by mouth daily. (Patient not taking: Reported on 04/07/2022)   No facility-administered medications prior to visit.    Review of Systems  Constitutional:  Negative for appetite change, chills and fever.  Respiratory:  Negative for cough and shortness of breath.   Cardiovascular:  Negative for chest pain and leg swelling.  Gastrointestinal:  Positive for diarrhea. Negative for abdominal distention, abdominal pain, blood in stool, nausea and vomiting.    Last CBC Lab Results  Component Value Date   WBC 5.5 11/05/2021   HGB 9.9 (L) 11/05/2021   HCT 31.5 (L) 11/05/2021   MCV 85 11/05/2021   MCH 26.8 11/05/2021   RDW 14.3 11/05/2021   PLT 244 42/87/6811   Last metabolic panel Lab Results  Component Value Date   GLUCOSE 83 04/05/2022   NA 142 04/05/2022   K 4.5 04/05/2022   CL 108 (H) 04/05/2022   CO2 17 (L) 04/05/2022   BUN 28 (H) 04/05/2022   CREATININE 2.46 (H) 04/05/2022   EGFR 22 (L) 04/05/2022   CALCIUM 9.9 04/05/2022   PHOS 3.1 08/22/2015   PROT 7.3 11/05/2021   ALBUMIN 4.3 11/05/2021   LABGLOB 3.0 11/05/2021   AGRATIO 1.4 11/05/2021   BILITOT <0.2 11/05/2021   ALKPHOS 68 11/05/2021   AST 17 11/05/2021   ALT 17 11/05/2021   ANIONGAP 10 02/27/2019   Last lipids Lab Results  Component Value Date   CHOL 132 11/05/2021   HDL 73 11/05/2021   LDLCALC 41 11/05/2021   TRIG 100 11/05/2021   CHOLHDL 1.8 11/05/2021   Last hemoglobin A1c Lab Results  Component Value Date   HGBA1C 8.5 (A) 10/27/2021   Last thyroid functions Lab Results  Component Value Date   TSH 2.560 11/05/2021   Last vitamin B12 and Folate Lab Results  Component Value Date   VITAMINB12 772 08/09/2013        Objective    BP 113/60 (BP Location: Left Arm, Patient Position: Sitting, Cuff Size: Large)   Pulse 69   Temp 98.2 F (36.8 C) (Oral)   Resp 16   Ht _0  (1.6 m)   Wt 192 lb 12.8 oz (87.5 kg)   LMP  (LMP Unknown)   BMI 34.15 kg/m  BP Readings from Last 3 Encounters:  04/07/22 113/60  02/23/22 115/68  01/07/22 138/78   Wt Readings from Last 3 Encounters:  04/07/22 192 lb 12.8 oz (87.5 kg)  02/23/22 195 lb 6.4 oz (88.6 kg)  01/07/22 204 lb (92.5 kg)      Physical Exam Vitals reviewed.  Constitutional:      Appearance:  Normal appearance. She is obese.  HENT:     Head: Normocephalic and atraumatic.     Nose: Nose normal.  Eyes:     Extraocular Movements: Extraocular movements intact.     Conjunctiva/sclera: Conjunctivae normal.     Pupils: Pupils are equal, round, and reactive to light.  Cardiovascular:     Rate and Rhythm: Normal rate and regular rhythm.     Pulses: Normal pulses.     Heart sounds: Normal heart sounds.  Pulmonary:     Effort: Pulmonary effort is normal.     Breath sounds: Normal breath sounds.  Abdominal:     General: Bowel sounds are normal. There is distension.     Palpations: There is no mass.     Tenderness: There is abdominal tenderness. There is right CVA tenderness. There is no left CVA tenderness, guarding or rebound.     Hernia: No hernia is present.  Musculoskeletal:        General: Normal range of motion.     Cervical back: Normal range of motion.  Neurological:     General: No focal deficit present.     Mental Status: She is alert and oriented to person, place, and time. Mental status is at baseline.  Psychiatric:        Behavior: Behavior normal.        Thought Content: Thought content normal.        Judgment: Judgment normal.      No results found for any visits on 04/07/22.  Assessment & Plan     1. Abnormal renal function Could be due to Rybelsus/Ozempic injections. Started last week. GFR from 03/29/22 dropped to 22. Pt  was contacted by Cardiology and was advised to repeat BMP on 04/05/22 it was 22 with increased creatinine to 2.46 and BUN/Creatinine ratio of 11. On PE, positive for CVA tenderness. No fever - Comprehensive metabolic panel - POCT urinalysis dipstick positive for protein negative for nitrites, leukocytes - Microalbumin / creatinine urine ratio Will reassess  2. Type 2 diabetes mellitus without complication, without long-term current use of insulin (HCC) Last A1C was 8.5 on 10/27/21 Continue Metformin Discontinue Rybelsus and ozempic Last dose of Rybelsus last Wednesday Fist and last dose of Ozempic was done last Thursday. - Comprehensive metabolic panel - Hemoglobin A1c - POCT urinalysis dipstick - Microalbumin / creatinine urine ratio Will switch to Farxiga 5 mg if GFR will improve and after nephrology evaluation. #21 Benefits and risks were discussed. Patient agreed and expressed her understanding. Free sample provided.  3. Stage 3 chronic kidney disease, unspecified whether stage 3a or 3b CKD (HCC) Worsening. Last BMPx2  showed GFR of 22 Advised an adequate hydration Rybelsus and ozempic were d/c today She has been taking Rybelsus since 09/22/21 without any side effects. - Comprehensive metabolic panel - Hemoglobin A1c - POCT urinalysis dipstick - Microalbumin / creatinine urine ratio Pt has minimal ankle/foot swellings. Advised to proceed with compression stockings Was referred to Nephrology by Cardiology on 04/06/22  4. Diarrhea In the setting of chronic diarrhea. Per pt's spouse , has been worsening since pt started on her vegetarian diet a year ago Per pt, she had last loose stool X 2 yesterday. She has been on Rybelsus for a few months She started Ozempic injection last week. Her BMP/GFR of 22 was checked on 03/29/22 Advised increase fibers in her diet including oats and oatmeal, natural applesauce, lentils, pears, finely ground flaxseeds, rice and barley  The patient  was advised  to call back or seek an in-person evaluation if the symptoms worsen or if the condition fails to improve as anticipated.  I discussed the assessment and treatment plan with the patient. The patient was provided an opportunity to ask questions and all were answered. The patient agreed with the plan and demonstrated an understanding of the instructions.  The entirety of the information documented in the History of Present Illness, Review of Systems and Physical Exam were personally obtained by me. Portions of this information were initially documented by the CMA and reviewed by me for thoroughness and accuracy.  [name], [facility], [place] [phone] (phone) [phone] (fax)

## 2022-04-08 ENCOUNTER — Other Ambulatory Visit: Payer: Self-pay | Admitting: Physician Assistant

## 2022-04-08 ENCOUNTER — Telehealth: Payer: Self-pay

## 2022-04-08 DIAGNOSIS — N184 Chronic kidney disease, stage 4 (severe): Secondary | ICD-10-CM

## 2022-04-08 LAB — COMPREHENSIVE METABOLIC PANEL
ALT: 41 IU/L — ABNORMAL HIGH (ref 0–32)
AST: 43 IU/L — ABNORMAL HIGH (ref 0–40)
Albumin/Globulin Ratio: 1.6 (ref 1.2–2.2)
Albumin: 4.6 g/dL (ref 3.9–4.9)
Alkaline Phosphatase: 74 IU/L (ref 44–121)
BUN/Creatinine Ratio: 10 — ABNORMAL LOW (ref 12–28)
BUN: 24 mg/dL (ref 8–27)
Bilirubin Total: <0.2 mg/dL (ref 0.0–1.2)
CO2: 19 mmol/L — ABNORMAL LOW (ref 20–29)
Calcium: 10.6 mg/dL — ABNORMAL HIGH (ref 8.7–10.3)
Chloride: 104 mmol/L (ref 96–106)
Creatinine, Ser: 2.5 mg/dL — ABNORMAL HIGH (ref 0.57–1.00)
Globulin, Total: 2.9 g/dL (ref 1.5–4.5)
Glucose: 114 mg/dL — ABNORMAL HIGH (ref 70–99)
Potassium: 4.8 mmol/L (ref 3.5–5.2)
Sodium: 140 mmol/L (ref 134–144)
Total Protein: 7.5 g/dL (ref 6.0–8.5)
eGFR: 21 mL/min/{1.73_m2} — ABNORMAL LOW (ref >59)

## 2022-04-08 LAB — HEMOGLOBIN A1C
Est. average glucose Bld gHb Est-mCnc: 137 mg/dL
Hgb A1c MFr Bld: 6.4 % — ABNORMAL HIGH (ref 4.8–5.6)

## 2022-04-08 NOTE — Telephone Encounter (Signed)
Copied from [place] [id number]. Topic: Referral - Question >> Apr 08, 2022  1:17 PM Leilani Able wrote: PT Bothered by the fact that she is have trouble with nephrology referral. Looks as if another referral was just done on 12/5? Pt is stating that she knows her numbers are not good and she will go to [facility] if need be. She seems frustrated as she call [facility] this am and held for a very long time and got hung up on. States if you can find somewhere she will go wherever. Pls fu to advise. [phone]

## 2022-04-08 NOTE — Progress Notes (Signed)
Please, let pt know that her GFR dropped to 21. Please, ask if she was able to schedule an appt with Nephrology.  A1C improved to 6.4

## 2022-04-08 NOTE — Telephone Encounter (Signed)
Please advise 

## 2022-04-09 ENCOUNTER — Ambulatory Visit (INDEPENDENT_AMBULATORY_CARE_PROVIDER_SITE_OTHER): Payer: Medicare Other | Admitting: Clinical

## 2022-04-09 DIAGNOSIS — F330 Major depressive disorder, recurrent, mild: Secondary | ICD-10-CM

## 2022-04-09 DIAGNOSIS — F411 Generalized anxiety disorder: Secondary | ICD-10-CM

## 2022-04-09 LAB — MICROALBUMIN / CREATININE URINE RATIO
Creatinine, Urine: 186.9 mg/dL
Microalb/Creat Ratio: 12 mg/g creat (ref 0–29)
Microalbumin, Urine: 21.9 ug/mL

## 2022-04-09 NOTE — Progress Notes (Signed)
[place] Counselor/Therapist Progress Note  Patient ID: Abigail Figueroa, MRN: [id number],    Date: 04/09/2022  Time Spent: 12:32pm - 1:21pm : 49 minutes   Treatment Type: Individual Therapy  Reported Symptoms: Patient stated, "its not all the way up" in response to current mood.   Mental Status Exam: Appearance:  Neat     Behavior: Appropriate  Motor: Normal  Speech/Language:  Clear and Coherent  Affect: Appropriate  Mood: Patient stated, "its not all the way up"  Thought process: normal  Thought content:   WNL  Sensory/Perceptual disturbances:   WNL  Orientation: oriented to person, place, and situation  Attention: Good  Concentration: Good  Memory: WNL  Fund of knowledge:  Good  Insight:   Good  Judgment:  Good  Impulse Control: Good   Risk Assessment: Danger to Self:  No Patient denied current suicidal ideation Self-injurious Behavior: No Danger to Others: No Patient denied current homicidal ideation Duty to Warn:no Physical Aggression / Violence:No  Access to Firearms a concern: No  Gang Involvement:No   Subjective: Patient stated, "everything is going down hill" since last session. Patient reported she recently found out that she was in stage four kidney failure and the numbers continue to increase.  Patient reported her physician has referred patient to a nephrologist. Patient stated, "Im praying" in response to recent diagnosis. Patient stated, "its depressing" and stated, "if the numbers don't stop I know I'm going to die". Patient stated, "I think Im doing ok with it". Patient reported her husband is being supportive and she was surprised by his support. Patient stated, "that makes me feel pretty good that he's being supportive and helping me through this". Patient stated, "I just feel down" and stated, thinking "why me" in response to diagnosis. Patient stated, "I'm not giving up" and stated, "I'm going to fight". Patient reported she loves to walk  and spend time outside. Patient reported her physician discontinued patient's medication for diabetes and patient is managing diabetes with diet and exercise.  Patient stated, "it seems so different" in regards to her household environment. Patient reported the holiday decorations have improved patient's mood and contributed to changes in household environment. Patient reported positive communication amongst family members in the household and her husband providing more support with household chores has contributed to change in environment.   Interventions: Cognitive Behavioral Therapy. Clinician conducted session via WebEx video from clinician's home office. Patient provided verbal consent to proceed with telehealth session and participated in session from patient's home.  Provided supportive therapy, active listening, and validation as patient discussed recent medical diagnosis and patient's response to the diagnosis. Explored and identified strategies to increase patient's physical activity, such as, silver sneakers program. Provided psycho education related to exercise and impact on mood. Discussed recent changes in patient's environment, explored triggers for recent changes, and discussed impact on patient's mood.     Diagnosis:  Mild episode of recurrent major depressive disorder (HCC)   Generalized anxiety disorder   Plan: Patient is to utilize [facility], thought re-framing, relaxation techniques, mindfulness, effective communication, and coping strategies to decrease symptoms associated with Major Depressive Disorder and Generalized Anxiety Disorder. Frequency: bi-weekly  Modality: individual      Long-term goal:   Patient stated, "I want to see a change in the anxiety".      Reduce overall level, frequency, and intensity of the symptoms of anxiety and depression as evidenced by decreased sadness, feelings of anxiety, feeling "lost", lack of  energy, difficulty falling and  staying asleep, changes in concentration, psychomotor retardation, rapid heart rate, excessive worry, restlessness, feeling jittery, and feeling on edge from 5 to 6 days per week to 0 to 1 days per week per patient reported for at least 3 consecutive months.    Target Date: 02/06/23  Progress: progressing    Short-term goal:  Develop coping strategies to utilize in response to symptoms of depression/anxiety and stressors   Target Date: 08/07/22  Progress: progressing    Verbally express patient's thoughts and feelings to others and utilize effective communication strategies when expressing patient's thoughts/feelings   Target Date: 08/07/22  Progress: progressing    Verbalize an understanding of the relationship between symptoms of anxiety and the impact on patient's thought patterns and behaviors   Target Date: 08/07/22  Progress: progressing    Katherina Right, LCSW

## 2022-04-09 NOTE — Progress Notes (Signed)
                Jakari Jacot, LCSW 

## 2022-04-11 NOTE — Progress Notes (Deleted)
Psychiatric Initial Adult Assessment   Patient Identification: Abigail Figueroa MRN:  [id number] Date of Evaluation:  04/11/2022 Referral Source: *** Chief Complaint:  No chief complaint on file.  Visit Diagnosis: No diagnosis found.  History of Present Illness:   Abigail Figueroa is a 61 y.o. year old female with a history of diabetes, stage IV CKD, hypertension, hyperlipidemia, OSA on CPAP, seizure disorder, who is referred for          Associated Signs/Symptoms: Depression Symptoms:  {DEPRESSION SYMPTOMS:20000} (Hypo) Manic Symptoms:  {BHH MANIC SYMPTOMS:22872} Anxiety Symptoms:  {BHH ANXIETY SYMPTOMS:22873} Psychotic Symptoms:  {BHH PSYCHOTIC SYMPTOMS:22874} PTSD Symptoms: {BHH PTSD SYMPTOMS:22875}  Past Psychiatric History:  Outpatient:  Psychiatry admission:  Previous suicide attempt:  Past trials of medication:  History of violence:  History of head injury:   Previous Psychotropic Medications: {YES/NO:21197}  Substance Abuse History in the last 12 months:  {yes no:314532}  Consequences of Substance Abuse: {BHH CONSEQUENCES OF SUBSTANCE ABUSE:22880}  Past Medical History:  Past Medical History:  Diagnosis Date   Abnormal stress test    a. 10/2009: Normal myocardial perfusion imaging; b. 08/2015 MV: medium defect of mod severity in mid ant apical region w/ mild HK of the distal inf wall;  c. 08/2015 Cath: nl Cors, EF 60%.   Anemia    Angio-edema    Anxiety    Arthritis    Bell's palsy    Depression    Edema    Essential hypertension    Frequent urination    Frequent urination at night    Headache    migraines, gets botox injections   Heart palpitations June 2013   Event monitor showing sinus tachycardia, PACs with couplets and triplets.   Hx of echocardiogram    a. 10/2009 Echo: showed normal left ventricular function, mild left ventricular hypertrophy, and no significant valve abnormalities.   Hypercholesterolemia    Migraine    Morbid obesity  ([place])    Neuromuscular disorder (HCC)    OSA (obstructive sleep apnea)    has been on continuous positive airway pressure   Seizure disorder ([place])    Seizures ([place])    Type II diabetes mellitus ([place])    Urticaria     Past Surgical History:  Procedure Laterality Date   ABDOMINAL HYSTERECTOMY  1990   without BSO   BACK SURGERY  1900s 2001   x2 with "cage put in"   [place] N/A 08/06/2015   Procedure: Left Heart Cath and Coronary Angiography;  Surgeon: Troy Sine, MD;  Location: [place] [facility];  Service: Cardiovascular;  Laterality: N/A;   CARPAL TUNNEL RELEASE Bilateral    CHOLECYSTECTOMY  1980   COLONOSCOPY WITH PROPOFOL N/A 08/30/2017   Procedure: COLONOSCOPY WITH PROPOFOL;  Surgeon: Lin Landsman, MD;  Location: [facility];  Service: Endoscopy;  Laterality: N/A;   DG THUMB LEFT HAND  01/24/2018   repair joint in left thumb   DORSAL COMPARTMENT RELEASE Left 10/25/2014   Procedure: LEFT FIRST  DORSAL COMPARTMENT RELEASE AND RADIAL TENOSYNOVECTOMY ;  Surgeon: Roseanne Kaufman, MD;  Location: [place];  Service: Orthopedics;  Laterality: Left;   HAND SURGERY     KNEE SURGERY Right 2012   meniscus tear   KNEE SURGERY  2012   LAMINECTOMY  1995   TUBAL LIGATION  1982   WRIST SURGERY Right     Family Psychiatric History: ***  Family History:  Family History  Problem  Relation Age of Onset   Sarcoidosis Mother    Seizures Mother    Heart attack Mother    Alzheimer's disease Maternal Grandmother    Dementia Maternal Grandmother    Hypertension Maternal Grandfather    Diabetes Maternal Grandfather    Breast cancer Neg Hx    Allergic rhinitis Neg Hx    Asthma Neg Hx    Eczema Neg Hx    Urticaria Neg Hx     Social History:   Social History   Socioeconomic History   Marital status: Married    Spouse name: Not on file   Number of children: 2   Years of education: Not on file   Highest  education level: 12th grade  Occupational History   Occupation: disabled  Tobacco Use   Smoking status: Former    Types: Cigarettes    Quit date: 01/03/1983    Years since quitting: 39.2   Smokeless tobacco: Never   Tobacco comments:    quit in 1984  Vaping Use   Vaping Use: Never used  Substance and Sexual Activity   Alcohol use: No    Alcohol/week: 0.0 standard drinks of alcohol   Drug use: No   Sexual activity: Never  Other Topics Concern   Not on file  Social History Narrative   ** Merged History Encounter **       She is a married mother of 2, grandmother 3. She tries to get exercise but is not doing any routine program.   She quit smoking over 25 years ago does not drink alcohol.   Social Determinants of Health   Financial Resource Strain: Low Risk  (11/26/2021)   Overall Financial Resource Strain (CARDIA)    Difficulty of Paying Living Expenses: Not hard at all  Food Insecurity: No Food Insecurity (11/26/2021)   Hunger Vital Sign    Worried About Running Out of Food in the Last Year: Never true    Ran Out of Food in the Last Year: Never true  Transportation Needs: Unmet Transportation Needs (11/26/2021)   PRAPARE - Transportation    Lack of Transportation (Medical): Yes    Lack of Transportation (Non-Medical): Yes  Physical Activity: Insufficiently Active (11/26/2021)   Exercise Vital Sign    Days of Exercise per Week: 7 days    Minutes of Exercise per Session: 20 min  Stress: Stress Concern Present (11/26/2021)   [place]    Feeling of Stress : To some extent  Social Connections: Moderately Isolated (11/26/2021)   Social Connection and Isolation Panel [NHANES]    Frequency of Communication with Friends and Family: More than three times a week    Frequency of Social Gatherings with Friends and Family: More than three times a week    Attends Religious Services: Never    Marine scientist or  Organizations: No    Attends Archivist Meetings: Never    Marital Status: Married    Additional Social History: ***  Allergies:   Allergies  Allergen Reactions   Morphine Rash and Swelling   Morphine And Related Hives    Nausea and vomiting  Nausea and vomiting   Oxycodone Hives, Itching, Rash and Swelling   Oxycodone Hcl Swelling, Hives and Itching   Aimovig [Erenumab-Aooe]     Questionable lip swelling angoiedema   Augmentin [Amoxicillin-Pot Clavulanate] Hives   Morphine Sulfate Itching and Nausea And Vomiting    REACTION: swelling    Metabolic  Disorder Labs: Lab Results  Component Value Date   HGBA1C 6.4 (H) 04/07/2022   MPG 154.2 02/27/2019   MPG 171 02/28/2017   No results found for: "PROLACTIN" Lab Results  Component Value Date   CHOL 132 11/05/2021   TRIG 100 11/05/2021   HDL 73 11/05/2021   CHOLHDL 1.8 11/05/2021   VLDL 22 02/27/2019   LDLCALC 41 11/05/2021   LDLCALC 71 08/15/2020   Lab Results  Component Value Date   TSH 2.560 11/05/2021    Therapeutic Level Labs: Lab Results  Component Value Date   LITHIUM 0.9 08/22/2015   Lab Results  Component Value Date   CBMZ 5.2 12/16/2021   No results found for: "VALPROATE"  Current Medications: Current Outpatient Medications  Medication Sig Dispense Refill   amLODipine (NORVASC) 5 MG tablet TAKE 1.5 TABLETS BY MOUTH EVERY DAY 135 tablet 1   Azelastine HCl 0.15 % SOLN Place 2 sprays into both nostrils 2 (two) times daily. 30 mL 4   baclofen (LIORESAL) 10 MG tablet Take 10 mg by mouth 2 (two) times daily as needed (for migraines).     BOTOX 100 UNITS SOLR injection Inject into the muscle every 3 (three) months.      busPIRone (BUSPAR) 15 MG tablet TAKE 1 TABLET BY MOUTH TWICE A DAY 180 tablet 1   chlorproMAZINE (THORAZINE) 25 MG tablet Take 50 mg by mouth 2 (two) times daily as needed.     clindamycin (CLEOCIN T) 1 % lotion APPLY TOPICALLY ONCE A DAY TO AREAS OF ACNE ON THE FACE      diclofenac Sodium (VOLTAREN) 1 % GEL Apply 4 g topically 4 (four) times daily. (Patient taking differently: Apply 4 g topically as needed.) 150 g 1   EMGALITY 120 MG/ML SOAJ Inject into the skin as needed.     EPINEPHrine 0.3 mg/0.3 mL IJ SOAJ injection Inject 0.3 mLs (0.3 mg total) into the muscle as needed for up to 1 dose for anaphylaxis (Call 911 if used.). 1 each 1   ezetimibe (ZETIA) 10 MG tablet Take 1 tablet (10 mg total) by mouth daily. 90 tablet 3   fluocinonide ointment (LIDEX) 2.42 % Apply 1 application topically 2 (two) times daily. 30 g 1   fluticasone (FLONASE) 50 MCG/ACT nasal spray Place 2 sprays into both nostrils in the morning and at bedtime. 11.1 mL 11   furosemide (LASIX) 40 MG tablet Take 1 tablet (40 mg total) by mouth daily. 90 tablet 3   glucose blood (ONETOUCH VERIO) test strip TEST FASTING GLUCOSE DAILY AS DIRECTED 100 each 3   hydrocortisone 1 % ointment Apply 1 application topically 2 (two) times daily. To areas of itching not to use if any blisters or open wounds. USE PRN itching. 30 g 2   lamoTRIgine (LAMICTAL) 100 MG tablet Take 100 mg by mouth 2 (two) times daily.     Lancets (ONETOUCH ULTRASOFT) lancets Test fasting each morning and 2 hours before supper. Retest if having hypoglycemic symptoms. 100 each 12   levocetirizine (XYZAL) 5 MG tablet Take 1 tablet (5 mg total) by mouth daily as needed. 90 tablet 3   metFORMIN (GLUCOPHAGE) 500 MG tablet TAKE 2 TABLETS BY MOUTH IN THE MORNING ,1 TABLET AT LUNCH, & TAKE 2 TABLETS IN THE EVENING 450 tablet 1   metoprolol tartrate (LOPRESSOR) 100 MG tablet TAKE 1 TABLET BY MOUTH TWICE A DAY 180 tablet 2   promethazine (PHENERGAN) 25 MG tablet Take 25 mg by mouth every 4 (  four) hours as needed.     rosuvastatin (CRESTOR) 20 MG tablet TAKE 1 TABLET BY MOUTH EVERY DAY 90 tablet 1   Semaglutide (RYBELSUS) 3 MG TABS Take 1 tablet by mouth daily. (Patient not taking: Reported on 04/07/2022) 30 tablet 1   Semaglutide,0.25 or 0.'5MG'$ /DOS,  (OZEMPIC, 0.25 OR 0.5 MG/DOSE,) 2 MG/3ML SOPN Inject 0.25 mg into the skin once a week. 3 mL 0   TEGRETOL-XR 400 MG 12 hr tablet TAKE 1 TABLET BY MOUTH TWICE A DAY 60 tablet 1   Vilazodone HCl (VIIBRYD) 40 MG TABS TAKE 1 TABLET BY MOUTH EVERY DAY 90 tablet 0   No current facility-administered medications for this visit.    Musculoskeletal: Strength & Muscle Tone: within normal limits Gait & Station: normal Patient leans: N/A  Psychiatric Specialty Exam: Review of Systems  There were no vitals taken for this visit.There is no height or weight on file to calculate BMI.  General Appearance: {Appearance:22683}  Eye Contact:  {BHH EYE CONTACT:22684}  Speech:  Clear and Coherent  Volume:  Normal  Mood:  {BHH MOOD:22306}  Affect:  {Affect (PAA):22687}  Thought Process:  Coherent  Orientation:  Full (Time, Place, and Person)  Thought Content:  Logical  Suicidal Thoughts:  {ST/HT (PAA):22692}  Homicidal Thoughts:  {ST/HT (PAA):22692}  Memory:  Immediate;   Good  Judgement:  {Judgement (PAA):22694}  Insight:  {Insight (PAA):22695}  Psychomotor Activity:  Normal  Concentration:  Concentration: Good and Attention Span: Good  Recall:  Good  Fund of Knowledge:Good  Language: Good  Akathisia:  No  Handed:  Right  AIMS (if indicated):  not done  Assets:  Communication Skills Desire for Improvement  ADL's:  Intact  Cognition: WNL  Sleep:  {BHH GOOD/FAIR/POOR:22877}   Screenings: PHQ2-9    Flowsheet Row Office Visit from 04/07/2022 in [place] from 11/26/2021 in [place] Visit from 06/02/2021 in [place] Visit from 01/16/2021 in [facility] from 04/08/2020 in [place]  PHQ-2 Total Score '2 1 2 1 1  '$ PHQ-9 Total Score '5 6 8 4 '$ --       Assessment and Plan:  Assessment  Plan   The patient demonstrates the following risk factors for suicide:  Chronic risk factors for suicide include: {Chronic Risk Factors for ZLDJTTS:17793903}. Acute risk factors for suicide include: {Acute Risk Factors for ESPQZRA:07622633}. Protective factors for this patient include: {Protective Factors for Suicide HLKT:62563893}. Considering these factors, the overall suicide risk at this point appears to be {Desc; low/moderate/high:110033}. Patient {ACTION; IS/IS TDS:28768115} appropriate for outpatient follow up.        Collaboration of Care: {BH OP Collaboration of Care:21014065}  Patient/Guardian was advised Release of Information must be obtained prior to any record release in order to collaborate their care with an outside provider. Patient/Guardian was advised if they have not already done so to contact the registration department to sign all necessary forms in order for [place] to release information regarding their care.   Consent: Patient/Guardian gives verbal consent for treatment and assignment of benefits for services provided during this visit. Patient/Guardian expressed understanding and agreed to proceed.   Norman Clay, MD 12/10/20232:52 PM

## 2022-04-12 ENCOUNTER — Encounter: Payer: Self-pay | Admitting: Physician Assistant

## 2022-04-12 ENCOUNTER — Telehealth: Payer: Self-pay | Admitting: Physician Assistant

## 2022-04-12 ENCOUNTER — Ambulatory Visit: Payer: Medicare Other | Attending: Physician Assistant | Admitting: Physician Assistant

## 2022-04-12 ENCOUNTER — Telehealth: Payer: Self-pay

## 2022-04-12 VITALS — BP 120/68 | HR 70 | Ht 63.0 in | Wt 191.6 lb

## 2022-04-12 DIAGNOSIS — R600 Localized edema: Secondary | ICD-10-CM

## 2022-04-12 DIAGNOSIS — R002 Palpitations: Secondary | ICD-10-CM

## 2022-04-12 DIAGNOSIS — R0609 Other forms of dyspnea: Secondary | ICD-10-CM

## 2022-04-12 DIAGNOSIS — N184 Chronic kidney disease, stage 4 (severe): Secondary | ICD-10-CM | POA: Diagnosis not present

## 2022-04-12 DIAGNOSIS — I5032 Chronic diastolic (congestive) heart failure: Secondary | ICD-10-CM

## 2022-04-12 DIAGNOSIS — I10 Essential (primary) hypertension: Secondary | ICD-10-CM

## 2022-04-12 DIAGNOSIS — R079 Chest pain, unspecified: Secondary | ICD-10-CM | POA: Diagnosis not present

## 2022-04-12 DIAGNOSIS — E782 Mixed hyperlipidemia: Secondary | ICD-10-CM

## 2022-04-12 MED ORDER — ISOSORBIDE MONONITRATE ER 30 MG PO TB24: 15.0000 mg | ORAL_TABLET | Freq: Every evening | ORAL | 1 refills | Status: DC

## 2022-04-12 NOTE — Telephone Encounter (Signed)
Pt reports that she stopped having GI symptoms for 2-3 days on 04/09/22

## 2022-04-12 NOTE — Patient Instructions (Addendum)
Medication Instructions:  Start Imdur 15 mg ( Take 1/2 of a 30 mg Tablet in The Evening). *If you need a refill on your cardiac medications before your next appointment, please call your pharmacy*   Lab Work: BMET, Magnesium Level,TSH, [place], Protein Electrophoresis, Protein Electrophoresis Urine w/Reflex If you have labs (blood work) drawn today and your tests are completely normal, you will receive your results only by: MyChart Message (if you have MyChart) OR A paper copy in the mail If you have any lab test that is abnormal or we need to change your treatment, we will call you to review the results.   Testing/Procedures: No Testing   Follow-Up: At [facility], you and your health needs are our priority.  As part of our continuing mission to provide you with exceptional heart care, we have created designated Provider Care Teams.  These Care Teams include your primary Cardiologist (physician) and Advanced Practice Providers (APPs -  Physician Assistants and Nurse Practitioners) who all work together to provide you with the care you need, when you need it.  We recommend signing up for the patient portal called "MyChart".  Sign up information is provided on this After Visit Summary.  MyChart is used to connect with patients for Virtual Visits (Telemedicine).  Patients are able to view lab/test results, encounter notes, upcoming appointments, etc.  Non-urgent messages can be sent to your provider as well.   To learn more about what you can do with MyChart, go to [email or web address].    Your next appointment:   Follow up As Scheduled  The format for your next appointment:   In Person  Provider:   Shelva Majestic, MD

## 2022-04-12 NOTE — Telephone Encounter (Signed)
Copied from [place] [phone]. Topic: General - Other >> Apr 12, 2022  3:14 PM [place] B wrote: Reason for CRM: Edwena Felty from [facility] neph[facility] called in asked for patient's blood work from December to be faxed to them before they see her , and this is urgent. Fx [phone]

## 2022-04-12 NOTE — Telephone Encounter (Signed)
Spoke with pt on 04/09/22 and discussed her labs. Pt was advised to stop Metformin and check her BS at home daily. If her BS, started to increase, she should let us know. Pt was advised to hydrate properly and if her urine output decreases, leg swelling increases, let us know. If she started to feel poorly during the weekend, she should proceed to ER.

## 2022-04-13 ENCOUNTER — Ambulatory Visit: Payer: Medicare Other | Admitting: Psychiatry

## 2022-04-13 ENCOUNTER — Telehealth: Payer: Self-pay | Admitting: Physician Assistant

## 2022-04-13 NOTE — Telephone Encounter (Signed)
Labs faxed-patient made aware.

## 2022-04-13 NOTE — Telephone Encounter (Signed)
Patient is requesting to have her lab results faxed to [facility] at [phone]. She states she has an appointment scheduled with them on 12/14.

## 2022-04-14 ENCOUNTER — Other Ambulatory Visit: Payer: Self-pay | Admitting: Physician Assistant

## 2022-04-14 DIAGNOSIS — E119 Type 2 diabetes mellitus without complications: Secondary | ICD-10-CM

## 2022-04-14 LAB — BASIC METABOLIC PANEL
BUN/Creatinine Ratio: 10 — ABNORMAL LOW (ref 12–28)
BUN: 24 mg/dL (ref 8–27)
CO2: 22 mmol/L (ref 20–29)
Calcium: 10.3 mg/dL (ref 8.7–10.3)
Chloride: 104 mmol/L (ref 96–106)
Creatinine, Ser: 2.37 mg/dL — ABNORMAL HIGH (ref 0.57–1.00)
Glucose: 122 mg/dL — ABNORMAL HIGH (ref 70–99)
Potassium: 4.2 mmol/L (ref 3.5–5.2)
Sodium: 140 mmol/L (ref 134–144)
eGFR: 23 mL/min/{1.73_m2} — ABNORMAL LOW (ref >59)

## 2022-04-14 LAB — PROTEIN ELECTROPHORESIS, URINE REFLEX
Albumin ELP, Urine: 16.2 %
Alpha-1-Globulin, U: 2.7 %
Alpha-2-Globulin, U: 18.2 %
Beta Globulin, U: 29.3 %
Gamma Globulin, U: 33.6 %
Protein, Ur: 24.5 mg/dL

## 2022-04-14 LAB — PROTEIN ELECTROPHORESIS, SERUM
A/G Ratio: 1.1 (ref 0.7–1.7)
Albumin ELP: 4 g/dL (ref 2.9–4.4)
Alpha 1: 0.3 g/dL (ref 0.0–0.4)
Alpha 2: 1 g/dL (ref 0.4–1.0)
Beta: 1.2 g/dL (ref 0.7–1.3)
Gamma Globulin: 1.4 g/dL (ref 0.4–1.8)
Globulin, Total: 3.8 g/dL (ref 2.2–3.9)
Total Protein: 7.8 g/dL (ref 6.0–8.5)

## 2022-04-14 LAB — KAPPA/LAMBDA LIGHT CHAINS
Ig Kappa Free Light Chain: 48.6 mg/L — ABNORMAL HIGH (ref 3.3–19.4)
Ig Lambda Free Light Chain: 18.5 mg/L (ref 5.7–26.3)
KAPPA/LAMBDA RATIO: 2.63 — ABNORMAL HIGH (ref 0.26–1.65)

## 2022-04-14 LAB — MAGNESIUM: Magnesium: 2.5 mg/dL — ABNORMAL HIGH (ref 1.6–2.3)

## 2022-04-14 LAB — TSH: TSH: 2.54 u[IU]/mL (ref 0.450–4.500)

## 2022-04-16 ENCOUNTER — Ambulatory Visit (INDEPENDENT_AMBULATORY_CARE_PROVIDER_SITE_OTHER): Payer: Medicare Other | Admitting: Clinical

## 2022-04-16 DIAGNOSIS — F330 Major depressive disorder, recurrent, mild: Secondary | ICD-10-CM | POA: Diagnosis not present

## 2022-04-16 DIAGNOSIS — F411 Generalized anxiety disorder: Secondary | ICD-10-CM

## 2022-04-16 NOTE — Progress Notes (Signed)
                Reighan Hipolito, LCSW 

## 2022-04-16 NOTE — Progress Notes (Signed)
[place] Counselor/Therapist Progress Note  Patient ID: Abigail Figueroa, MRN: [id number],    Date: 04/16/2022  Time Spent: 2:31pm - 3:24pm : 53 minutes   Treatment Type: Individual Therapy  Reported Symptoms: Patient stated, "its ok" in response to current mood.  Mental Status Exam: Appearance:  Neat     Behavior: Appropriate  Motor: Normal  Speech/Language:  Clear and Coherent  Affect: Appropriate  Mood: normal  Thought process: normal  Thought content:   WNL  Sensory/Perceptual disturbances:   WNL  Orientation: oriented to person, place, and situation  Attention: Good  Concentration: Good  Memory: WNL  Fund of knowledge:  Good  Insight:   Good  Judgment:  Good  Impulse Control: Good   Risk Assessment: Danger to Self:  No Patient denied current suicidal ideation Self-injurious Behavior: No Danger to Others: No Patient denied current homicidal ideation Duty to Warn:no Physical Aggression / Violence:No  Access to Firearms a concern: No  Gang Involvement:No   Subjective: Patient stated "things are ok". Patient stated, "its ok" in response to patient's current mood. Patient reported her labs have remained "steady" and she recently found out she was dehydrated. Patient reported she has been diagnosed with stage three kidney disease. Patient stated, "I thank God for that" in response to decrease from stage four kidney disease to stage three kidney disease. Patient reported she was recently diagnosed with congestive heart failure.  Patient stated, "I'm trying not to worry", "yeah it bothers me". Patient stated, "I'm not that happy but Im not that sad" and stated, "I'm calm" in response to recent stressors. Patient stated, "I'm looking at the positives". Patient reported recent conversation with her husband in which he made a statement that hurt her feelings. Patient reported she didn't respond to her husband's comment and stated, "I let it roll by". Patient  reported she feels she is at fault for the relationship with her brother and his children. Patient reported she is open to use of "I feel" statements and stated, "ill try it".   Interventions: Cognitive Behavioral Therapy. Clinician conducted session via WebEx video from clinician's home office. Patient provided verbal consent to proceed with telehealth session and participated in session from patient's home.  Provided supportive therapy, active listening, and validation as patient discussed recent additional diagnosis of Congestive Heart Failure and Stage Three Kidney disease. Processed patient's thoughts/feelings in response to recent diagnosis of congestive heart failure and recent change in diagnosis of kidney disease. Provided psycho education related to use of a gratitude journal and re framing negative thoughts by putting her thoughts on trial. Provided psycho education related to communication styles, forms of communication, and love languages. Discussed use of "I feel" statements when communicating with her husband. Clinician requested patient maintain thought record and gratitude journal for homework.    Diagnosis:  Mild episode of recurrent major depressive disorder (HCC)   Generalized anxiety disorder   Plan: Patient is to utilize [facility] Therapy, thought re-framing, relaxation techniques, mindfulness, effective communication, and coping strategies to decrease symptoms associated with Major Depressive Disorder and Generalized Anxiety Disorder. Frequency: bi-weekly  Modality: individual      Long-term goal:   Patient stated, "I want to see a change in the anxiety".      Reduce overall level, frequency, and intensity of the symptoms of anxiety and depression as evidenced by decreased sadness, feelings of anxiety, feeling "lost", lack of energy, difficulty falling and staying asleep, changes in concentration, psychomotor retardation, rapid heart rate,  excessive worry,  restlessness, feeling jittery, and feeling on edge from 5 to 6 days per week to 0 to 1 days per week per patient reported for at least 3 consecutive months.    Target Date: 02/06/23  Progress: progressing    Short-term goal:  Develop coping strategies to utilize in response to symptoms of depression/anxiety and stressors   Target Date: 08/07/22  Progress: progressing    Verbally express patient's thoughts and feelings to others and utilize effective communication strategies when expressing patient's thoughts/feelings   Target Date: 08/07/22  Progress: progressing    Verbalize an understanding of the relationship between symptoms of anxiety and the impact on patient's thought patterns and behaviors   Target Date: 08/07/22  Progress: progressing    Katherina Right, LCSW

## 2022-04-19 NOTE — Telephone Encounter (Signed)
My Chart message sent

## 2022-04-19 NOTE — Telephone Encounter (Signed)
Please, let pt know that I am relieved that you were able to see Nephrology last week. Please, check your BS daily. If your BS increases, you need to let us know so we could adjust your DM treatment. Recommended low-carb diet , healthy diet to avoid  diarrhea as we discussed and daily exercise as tolerated.

## 2022-05-07 ENCOUNTER — Ambulatory Visit (INDEPENDENT_AMBULATORY_CARE_PROVIDER_SITE_OTHER): Payer: Medicare Other | Admitting: Clinical

## 2022-05-07 DIAGNOSIS — F330 Major depressive disorder, recurrent, mild: Secondary | ICD-10-CM

## 2022-05-07 NOTE — Progress Notes (Signed)
[facility] Counselor/Therapist Progress Note  Patient ID: Abigail Figueroa, MRN: [id number],    Date: 05/07/2022  Time Spent: 2:42pm - 3:25pm : 43 minutes  Treatment Type: Individual Therapy  Reported Symptoms: Patient stated, "I'm feeling a little bit low" today.   Mental Status Exam: Appearance:  Could not assess      Behavior: Could not assess  Motor: Could not assess  Speech/Language:  Clear and Coherent  Affect: Could not assess  Mood: "A little bit low" per patient  Thought process: normal  Thought content:   WNL  Sensory/Perceptual disturbances:   WNL  Orientation: oriented to person, place, and situation  Attention: Good  Concentration: Good  Memory: WNL  Fund of knowledge:  Good  Insight:   Good  Judgment:  Good  Impulse Control: Good   Risk Assessment: Danger to Self:  No Patient denied current suicidal ideation Self-injurious Behavior: No Danger to Others: No Patient denied current homicidal ideation Duty to Warn:no Physical Aggression / Violence:No  Access to Firearms a concern: No  Gang Involvement:No   Subjective: Patient reported difficulty logging onto [employer]. Patient stated, "its been going" since last session. Patient reported kidney disease is currently at stage four. Patient reported she is currently managing diabetes diagnosis with diet. Patient reported her physician discontinued diabetes medications due to changes in patient's kidney function. Patient reported she is currently taking the relationship with her husband "one day at a time".  Patient reported her husband became upset recently due to the manner in which her son spoke to patient on the phone. Patient reported she and her husband had a difference of opinion on the way her son was talking to patient on the phone. Patient reported the conversation later escalated and she felt her husband was talking disrespectfully towards patient. Patient reported her husband started  calling patient names during the conversation. Patient reported her brother has started calling patient and patient reported her husband is upset by patient talking to her brother. Patient reported she cried in response to conflict with husband and kept her responses to husband minimal. Patient stated, "it doesn't do any good" in response to use of "I feel" statements. Patient reported she doesn't feel loved when her husband calls her names or is disrespectful towards patient.   Interventions: Cognitive Behavioral Therapy. Clinician conducted session via telephone from clinician's home office due to patient experiencing difficulty logging onto [employer]. Patient provided verbal consent to proceed with telehealth session and participated in session from patient's home. Discussed patient's current medical concerns. Provided supportive therapy, active listening, and validation as patient discussed recent conflict with husband regarding patient's conversation with her son. Explored patient's response to recent conflict with husband. Reviewed patient's use of "I feel" statements. Provided psycho education related to use of fair fighting rules.    Diagnosis:  Mild episode of recurrent major depressive disorder (HCC)   Generalized anxiety disorder   Plan: Patient is to utilize [employer] [facility], thought re-framing, relaxation techniques, mindfulness, effective communication, and coping strategies to decrease symptoms associated with Major Depressive Disorder and Generalized Anxiety Disorder. Frequency: bi-weekly  Modality: individual      Long-term goal:   Patient stated, "I want to see a change in the anxiety".      Reduce overall level, frequency, and intensity of the symptoms of anxiety and depression as evidenced by decreased sadness, feelings of anxiety, feeling "lost", lack of energy, difficulty falling and staying asleep, changes in concentration, psychomotor retardation, rapid heart  rate,  excessive worry, restlessness, feeling jittery, and feeling on edge from 5 to 6 days per week to 0 to 1 days per week per patient reported for at least 3 consecutive months.    Target Date: 02/06/23  Progress: progressing    Short-term goal:  Develop coping strategies to utilize in response to symptoms of depression/anxiety and stressors   Target Date: 08/07/22  Progress: progressing    Verbally express patient's thoughts and feelings to others and utilize effective communication strategies when expressing patient's thoughts/feelings   Target Date: 08/07/22  Progress: progressing    Verbalize an understanding of the relationship between symptoms of anxiety and the impact on patient's thought patterns and behaviors   Target Date: 08/07/22  Progress: progressing    Katherina Right, LCSW

## 2022-05-07 NOTE — Progress Notes (Signed)
                Rome Schlauch, LCSW 

## 2022-05-13 ENCOUNTER — Telehealth: Payer: Self-pay | Admitting: Cardiovascular Disease

## 2022-05-13 MED ORDER — ISOSORBIDE MONONITRATE ER 30 MG PO TB24: 30.0000 mg | ORAL_TABLET | Freq: Every day | ORAL | 3 refills | Status: DC

## 2022-05-13 NOTE — Telephone Encounter (Signed)
Spoke to patient Abigail Figueroa advice given.

## 2022-05-13 NOTE — Telephone Encounter (Signed)
Returned call to Abigail Figueroa compression stated he faxed paper work for compression stockings and was calling to check if completed.Advised to refax paperwork to fax # [phone].

## 2022-05-13 NOTE — Telephone Encounter (Signed)
Received compression stocking order from [facility].Form required [employer].Form faxed back to fax # [phone].

## 2022-05-13 NOTE — Telephone Encounter (Signed)
Calling bout the fax that sent to our office, so that patient can get compression sock. Please advise

## 2022-05-13 NOTE — Telephone Encounter (Signed)
Pt c/o medication issue:  1. Name of Medication:  isosorbide mononitrate (IMDUR) 30 MG 24 hr tablet  2. How are you currently taking this medication (dosage and times per day)?   3. Are you having a reaction (difficulty breathing--STAT)?   4. What is your medication issue?   Patient states she is out of medication because she has been taking 1 whole tablet daily instead of half. She states the pharmacist informed her  that she should have just been taking half. Patient states she has been doing well on the whole and she would like to know if she can have her dose increased. Please advise.

## 2022-05-13 NOTE — Telephone Encounter (Signed)
Spoke to patient she stated Fabian Sharp PA prescribed Imdur 30 mg take 1/2 tablet daily at last office visit 04/12/22.Stated she accidentally has been taking a whole tablet 30 mg daily.She she feels good.No chest pain.She is out and needs a new prescription.Advised to continue 30 mg daily.I will send message to Fabian Sharp for advice.

## 2022-05-14 ENCOUNTER — Ambulatory Visit (INDEPENDENT_AMBULATORY_CARE_PROVIDER_SITE_OTHER): Payer: Medicare Other | Admitting: Clinical

## 2022-05-14 ENCOUNTER — Ambulatory Visit: Payer: Self-pay

## 2022-05-14 ENCOUNTER — Ambulatory Visit: Payer: Medicare Other | Admitting: Physician Assistant

## 2022-05-14 DIAGNOSIS — F330 Major depressive disorder, recurrent, mild: Secondary | ICD-10-CM | POA: Diagnosis not present

## 2022-05-14 DIAGNOSIS — F411 Generalized anxiety disorder: Secondary | ICD-10-CM

## 2022-05-14 NOTE — Telephone Encounter (Signed)
  Chief Complaint: Constipation  5 days Symptoms: Above Frequency: 5 days Pertinent Negatives: Patient denies  Disposition: '[]'$ ED /'[]'$ Urgent Care (no appt availability in office) / '[x]'$ Appointment(In office/virtual)/ '[]'$  [facility]/ '[]'$ Home Care/ '[]'$ Refused Recommended Disposition /'[]'$ [facility]/ '[]'$  Follow-up with PCP Additional Notes: Pt has been staring to have a bm. PT had very small bm 2 days ago of hard stool. PT Is concerned about taking OTC medications without advice from provider. Per kidney specialist pt needs provider advice for any medications.     Summary: Constipation   Patient states that she has been constipated for five days. Patients states she does not have any abdominal pain but has rectal pain when trying to use the bathroom. She would like to know what she can take to help that will not interact with her other medications, specifically her heart medication.     Reason for Disposition  Last bowel movement (BM) > 4 days ago  Over-The-Counter (OTC) medicines for constipation, questions about  Answer Assessment - Initial Assessment Questions 1. STOOL PATTERN OR FREQUENCY: "How often do you have a bowel movement (BM)?"  (Normal range: 3 times a day to every 3 days)  "When was your last BM?"       Everyday - sometimes 2-3 times a day. Last BM 2 days ago 2. STRAINING: "Do you have to strain to have a BM?"      Yes 3. RECTAL PAIN: "Does your rectum hurt when the stool comes out?" If Yes, ask: "Do you have hemorrhoids? How bad is the pain?"  (Scale 1-10; or mild, moderate, severe)     Mild -moderate 4. STOOL COMPOSITION: "Are the stools hard?"      yes 5. BLOOD ON STOOLS: "Has there been any blood on the toilet tissue or on the surface of the BM?" If Yes, ask: "When was the last time?"     no 6. CHRONIC CONSTIPATION: "Is this a new problem for you?"  If No, ask: "How long have you had this problem?" (days, weeks, months)      5 days 7. CHANGES IN DIET OR  HYDRATION: "Have there been any recent changes in your diet?" "How much fluids are you drinking on a daily basis?"  "How much have you had to drink today?"      8. MEDICINES: "Have you been taking any new medicines?" "Are you taking any narcotic pain medicines?" (e.g., Dilaudid, morphine, Percocet, Vicodin)     No more metformin 9. LAXATIVES: "Have you been using any stool softeners, laxatives, or enemas?"  If Yes, ask "What, how often, and when was the last time?"     No 10. ACTIVITY:  "How much walking do you do every day?"  "Has your activity level decreased in the past week?"        Yes - some 11. CAUSE: "What do you think is causing the constipation?"        Unsure 12. OTHER SYMPTOMS: "Do you have any other symptoms?" (e.g., abdomen pain, bloating, fever, vomiting)       no 13. MEDICAL HISTORY: "Do you have a history of hemorrhoids, rectal fissures, or rectal surgery or rectal abscess?"          14. PREGNANCY: "Is there any chance you are pregnant?" "When was your last menstrual period?"  Protocols used: Constipation-A-AH

## 2022-05-14 NOTE — Progress Notes (Signed)
[facility] Counselor/Therapist Progress Note  Patient ID: Abigail Figueroa, MRN: [id number],    Date: 05/14/2022  Time Spent: 8:32am - 9:19am : 47 minutes  Treatment Type: Individual Therapy  Reported Symptoms: Patient stated, "its been pretty good" in response to mood.   Mental Status Exam: Appearance:  Neat     Behavior: Appropriate  Motor: Normal  Speech/Language:  Clear and Coherent  Affect: Appropriate  Mood: normal  Thought process: normal  Thought content:   WNL  Sensory/Perceptual disturbances:   WNL  Orientation: oriented to person, place, and situation  Attention: Good  Concentration: Good  Memory: WNL  Fund of knowledge:  Good  Insight:   Good  Judgment:  Good  Impulse Control: Good   Risk Assessment: Danger to Self:  No Patient denied current suicidal ideation Self-injurious Behavior: No Danger to Others: No Patient denied current homicidal ideation Duty to Warn:no Physical Aggression / Violence:No  Access to Firearms a concern: No  Gang Involvement:No   Subjective: Patient stated, "they're going a little bit better because I haven't communicating as much". Patient stated, "I just shut down".  Patient reported she has limited her communication with her husband and patient stated, "I think it's better" in regards to the impact on her environment. Patient reported her husband has treated her son differently since they were married and patient reported she feels her husband dislikes her son. Patient identified her husband's relationship with her son as a trigger for arguments between patient and husband. Patient reported she has not utilize fair fighting rules or "I feel" statements since last session due to limited communication with husband. Patient stated, "I'm tired and I have been hurt all my life, and thought I had married someone that truly loved me". Patient reported feeling hurt when her husband called her names during a previous argument.  Patient reported her home environment has been "peaceful" since limiting her communication with her husband.   Interventions: Cognitive Behavioral Therapy. Clinician conducted session via WebEx video from clinician's home office. Patient provided verbal consent to proceed with telehealth session and participated in session from patient's home. Explored changes in patient's communication with her husband, her husband's response to the change in communication, the impact on patient's environment, and identified triggers for conflict with husband.  Reviewed use of fair fighting rules and "I feel" statements.     Diagnosis:  Mild episode of recurrent major depressive disorder (HCC)   Generalized anxiety disorder   Plan: Patient is to utilize [employer] [facility], thought re-framing, relaxation techniques, mindfulness, effective communication, and coping strategies to decrease symptoms associated with Major Depressive Disorder and Generalized Anxiety Disorder. Frequency: bi-weekly  Modality: individual      Long-term goal:   Patient stated, "I want to see a change in the anxiety".      Reduce overall level, frequency, and intensity of the symptoms of anxiety and depression as evidenced by decreased sadness, feelings of anxiety, feeling "lost", lack of energy, difficulty falling and staying asleep, changes in concentration, psychomotor retardation, rapid heart rate, excessive worry, restlessness, feeling jittery, and feeling on edge from 5 to 6 days per week to 0 to 1 days per week per patient reported for at least 3 consecutive months.    Target Date: 02/06/23  Progress: progressing    Short-term goal:  Develop coping strategies to utilize in response to symptoms of depression/anxiety and stressors   Target Date: 08/07/22  Progress: progressing    Verbally express patient's thoughts and  feelings to others and utilize effective communication strategies when expressing patient's  thoughts/feelings   Target Date: 08/07/22  Progress: progressing    Verbalize an understanding of the relationship between symptoms of anxiety and the impact on patient's thought patterns and behaviors   Target Date: 08/07/22  Progress: progressing    Katherina Right, LCSW

## 2022-05-14 NOTE — Progress Notes (Signed)
                Katherina Right, LCSW

## 2022-05-18 DIAGNOSIS — N1832 Chronic kidney disease, stage 3b: Secondary | ICD-10-CM | POA: Diagnosis not present

## 2022-05-18 DIAGNOSIS — N2889 Other specified disorders of kidney and ureter: Secondary | ICD-10-CM | POA: Diagnosis not present

## 2022-05-19 ENCOUNTER — Telehealth: Payer: Self-pay | Admitting: Cardiovascular Disease

## 2022-05-19 ENCOUNTER — Encounter: Payer: Self-pay | Admitting: Physician Assistant

## 2022-05-19 NOTE — Telephone Encounter (Signed)
Pt c/o medication issue:  1. Name of Medication: Benztropine  2. How are you currently taking this medication (dosage and times per day)?   3. Are you having a reaction (difficulty breathing--STAT)?   4. What is your medication issue? Patient called stating the pharmacy gave her this medication by mistake.  She wants to know if this medication can have any interaction with any of the medication she is currently on.

## 2022-05-19 NOTE — Telephone Encounter (Signed)
Vesta Mixer, this is a pretty bad med error by her pharmacy.  Yes benztropine can cause elevated heart rates, it's not known to cause chest pain though.  Lamotrigine is supposed to be weaned off slowly if it's stopped, and they essentially just stopped her seizure med when they gave her the benztropine instead. Pts can experience withdrawal symptoms if their lamotrigine is stopped too fast (headache, insomnia, tremor, etc), I don't think it's unrealistic to assume that some of her chest pain could have been related to the medication mix up as well.  She should also follow up with her provider who prescribes the lamotrigine, she may need to be restarted on a lower dose first.

## 2022-05-19 NOTE — Telephone Encounter (Signed)
Returned call to patient and made her aware. Patient will reach out to her headache Dr. To discuss next steps.   Will also forward to Dr. Lavena Bullion, PA to make aware.

## 2022-05-19 NOTE — Telephone Encounter (Signed)
Returned call to patient who states she was accidentally given Benztropine instead of lamotrigine at the pharmacy back in August and just found out the medication mix up. Patient was given 180 tablets of Benztropine that she received in her Lamotrigine bottle. Patient states that she saw Fabian Sharp since August with complaints of chest pain and elevated heart rates- patient would like to know if this could be related to the medication error. Patient denies any symptoms at present. Advised patient I would forward message over to PharmD.

## 2022-05-19 NOTE — Telephone Encounter (Signed)
Error

## 2022-05-20 DIAGNOSIS — E119 Type 2 diabetes mellitus without complications: Secondary | ICD-10-CM | POA: Diagnosis not present

## 2022-05-20 DIAGNOSIS — N2889 Other specified disorders of kidney and ureter: Secondary | ICD-10-CM | POA: Diagnosis not present

## 2022-05-20 DIAGNOSIS — N179 Acute kidney failure, unspecified: Secondary | ICD-10-CM | POA: Diagnosis not present

## 2022-05-20 DIAGNOSIS — I129 Hypertensive chronic kidney disease with stage 1 through stage 4 chronic kidney disease, or unspecified chronic kidney disease: Secondary | ICD-10-CM | POA: Diagnosis not present

## 2022-05-20 DIAGNOSIS — N1832 Chronic kidney disease, stage 3b: Secondary | ICD-10-CM | POA: Diagnosis not present

## 2022-05-20 DIAGNOSIS — E1122 Type 2 diabetes mellitus with diabetic chronic kidney disease: Secondary | ICD-10-CM | POA: Diagnosis not present

## 2022-05-20 DIAGNOSIS — N183 Chronic kidney disease, stage 3 unspecified: Secondary | ICD-10-CM | POA: Diagnosis not present

## 2022-05-25 ENCOUNTER — Ambulatory Visit: Payer: Medicare Other | Attending: Cardiovascular Disease | Admitting: Cardiovascular Disease

## 2022-05-25 ENCOUNTER — Encounter: Payer: Self-pay | Admitting: Cardiovascular Disease

## 2022-05-25 DIAGNOSIS — N184 Chronic kidney disease, stage 4 (severe): Secondary | ICD-10-CM

## 2022-05-25 DIAGNOSIS — G4733 Obstructive sleep apnea (adult) (pediatric): Secondary | ICD-10-CM

## 2022-05-25 DIAGNOSIS — E669 Obesity, unspecified: Secondary | ICD-10-CM

## 2022-05-25 DIAGNOSIS — R002 Palpitations: Secondary | ICD-10-CM

## 2022-05-25 DIAGNOSIS — E785 Hyperlipidemia, unspecified: Secondary | ICD-10-CM | POA: Diagnosis not present

## 2022-05-25 DIAGNOSIS — I10 Essential (primary) hypertension: Secondary | ICD-10-CM | POA: Diagnosis not present

## 2022-05-25 NOTE — Progress Notes (Signed)
Cardiology Office Note    Date:  05/30/2022   ID:  Abigail Figueroa, DOB Dec 07, 1960, MRN [id number]  PCP:  [name], PA-C  Cardiologist:  Shelva Majestic, MD   21 -month follow-up evaluation.  History of Present Illness:  Abigail Figueroa is a 62 y.o. female who has a history of hypertension, palpitations, peripheral edema, hyperlipidemia, type 2 diabetes mellitus, depression, as well as migraines. In 2007, she underwent a sleep study which was interpreted by Dr. Chancy Milroy. I do not have the diagnostic polysomnogram report but I do have the CPAP titration trial. She ultimately was titrated up to an 8 cm water pressure. She states she initially used CPAP therapy for several years but had not used in years.   When I saw her in October 2014  her sleep had been very poor for several years and she had with frequent awakenings and loud snoring.   At that time, Her Epworth sleepiness scale revealed severe excessive daytime sleepiness.   Epworth Sleepiness Scale: Situation   Chance of Dozing/Sleeping (0 = never , 1 = slight chance , 2 = moderate chance , 3 = high chance )   sitting and reading 3   watching TV 3   sitting inactive in a public place 3   being a passenger in a motor vehicle for an hour or more 3   lying down in the afternoon 3   sitting and talking to someone 3   sitting quietly after lunch (no alcohol) 3   while stopped for a few minutes in traffic as the driver 0   Total Score  21   She received a S9 CPAP unit but ultimately again stopped using treatment.In April 2017, she underwent preoperative clearance nuclear study prior to undergoing lumbar back surgery.  This study was interpreted as intermediate risk with a possible defect in the mid, distal anterior wall and apex and there was distal inferior hypokinesis.  She ultimately was referred for cardiac catheterization which I performed on 08/06/2015 and revealed normal coronary arteries.   She was admitted to [facility] in September 2017 with Bell's palsy.  She was treated with steroids.   I  saw her in January 2018.  At that time I discussed the importance of resuming CPAP therapy and since it is been over 11 years since her initial evaluation I recommend repeat assessment.  Sleep study was done on November 08, 2017 which again confirmed severe sleep apnea with an AHI of 30.5/h and RDI of 42.5/h.  Events were more severe during REM sleep with an AHI of 76.1/h.  She had significant oxygen desaturation to a nadir of 71%.  CPAP was initiated and was titrated up to 11 cm.  She received a new ResMed AirSense 10 AutoSet CPAP unit with set up date on December 09, 2017.  She had worn the CPAP initially but but then developed a dermatitis/rash on her face for which she underwent dermatologic evaluation by Dr. Baxter Kail was felt to have contact dermatitis.  The patient felt that the mask was potentially contributing to this.  As result, recently she is only rarely been using CPAP.  A new download was obtained in the office  from February 14, 2018 through March 15, 2008.  This confirmed poor compliance with usage at only 10% over the last 30 days with 3 hours and 58 minutes of average use.  AHI however was excellent at 0.8 on 11  cm water pressure.   When I saw her in March 20, 2018 I stressed the importance of meeting compliance standards with CPAP use.  I discussed the negative cardiovascular consequences of untreated sleep apnea with reference to her health.  I recommend changing her mask to a DreamWear with chinstrap to provide minimal contact to her face to reduce potential contact dermatitis. She was on atorvastatin for hyperlipidemia and had not had recent laboratory.  She also was on amlodipine 5 mg, metoprolol 75 mg twice a day for hypertension and is diabetic on metformin.    She was evaluated by me in telemedicine encounters in May 2020 and in October 2020.  During her October 2020 encounter, blood  pressure was elevated and I recommended titration of metoprolol from 75 mg twice a day to 100 mg twice a day.  On her most recent download, AHI was excellent at 1.6 and 11 cm of water pressure the use was only 4 hours and 40 minutes.  Her LDL cholesterol was 105 despite being on atorvastatin I recommended the addition of Zetia 10 mg.  I last saw her on August 15, 2020.  At that time she was no longer using CPAP.   She believes she has not been on therapy since mid 2021.  A download from March 4 through October 02, 2019 had shown an AHI of 2.4 at 11 cm water pressure.  She states her machine began to make a loud noise and she stopped using it.  Presently she is not sleeping well.  She goes to bed between 10 PM and midnight, has nocturia at least 3 times per night and wakes up between 6 and 8 AM.  She has daytime sleepiness.  I discussed reinitiation of therapy and the possibility of needing a new machine since her machine was having issues and making noise.  Since I saw her, she has been followed by Ernie Avena of nephrology in [facility] with creatinine in January 2024 at 2.10.  She is been diabetic since 1999.  She has stopped metformin and is now on Ozempic.   She has been evaluated by Fabian Sharp, PA in September 2023 and was having lower extremity edema.  Sodium restriction was recommended in addition to transient increase in furosemide.  She underwent a 2D echo Doppler study on March 29, 2022 which showed an EF of 60 to 65% with grade 1 diastolic dysfunction.  RV systolic pressure was 84.6 mm.  Valves were essentially normal.   She has been using CPAP therapy and I obtained a download from December 24 through May 24, 2022.  Usage days was 73%, however usage days greater than 4 hours was only 60%.  She was averaging 5 hours and 6 minutes of CPAP on days used.  Her AHI was 2.9 with a pressure range set between 8 and 16 cm.  Her 95th percentile pressure is 11.3 with  maximum average pressure 12.2.  She previously had [facility] was no longer in CPAP business.  She has [place] [facility] and [employer].  [facility] [place].  Presently she goes to bed between 830 and 10 PM and wakes up between 4 and 5 AM.  She presents for evaluation.  Past Medical History:  Diagnosis Date   Abnormal stress test    a. 10/2009: Normal myocardial perfusion imaging; b. 08/2015 MV: medium defect of mod severity in mid ant apical region w/ mild HK  of the distal inf wall;  c. 08/2015 Cath: nl Cors, EF 60%.   Anemia    Angio-edema    Anxiety    Arthritis    Bell's palsy    Depression    Edema    Essential hypertension    Frequent urination    Frequent urination at night    Headache    migraines, gets botox injections   Heart palpitations June 2013   Event monitor showing sinus tachycardia, PACs with couplets and triplets.   Hx of echocardiogram    a. 10/2009 Echo: showed normal left ventricular function, mild left ventricular hypertrophy, and no significant valve abnormalities.   Hypercholesterolemia    Migraine    Morbid obesity ([place])    Neuromuscular disorder (HCC)    OSA (obstructive sleep apnea)    has been on continuous positive airway pressure   Seizure disorder ([place])    Seizures ([place])    Type II diabetes mellitus ([place])    Urticaria     Past Surgical History:  Procedure Laterality Date   ABDOMINAL HYSTERECTOMY  1990   without BSO   BACK SURGERY  1900s 2001   x2 with "cage put in"   [place] N/A 08/06/2015   Procedure: Left Heart Cath and Coronary Angiography;  Surgeon: Troy Sine, MD;  Location: [place] [facility];  Service: Cardiovascular;  Laterality: N/A;   CARPAL TUNNEL RELEASE Bilateral    CHOLECYSTECTOMY  1980   COLONOSCOPY WITH PROPOFOL N/A 08/30/2017   Procedure: COLONOSCOPY WITH PROPOFOL;  Surgeon: Lin Landsman, MD;  Location: [facility];   Service: Endoscopy;  Laterality: N/A;   DG THUMB LEFT HAND  01/24/2018   repair joint in left thumb   DORSAL COMPARTMENT RELEASE Left 10/25/2014   Procedure: LEFT FIRST  DORSAL COMPARTMENT RELEASE AND RADIAL TENOSYNOVECTOMY ;  Surgeon: Roseanne Kaufman, MD;  Location: [place];  Service: Orthopedics;  Laterality: Left;   HAND SURGERY     KNEE SURGERY Right 2012   meniscus tear   KNEE SURGERY  2012   LAMINECTOMY  1995   TUBAL LIGATION  1982   WRIST SURGERY Right     Current Medications: Outpatient Medications Prior to Visit  Medication Sig Dispense Refill   amLODipine (NORVASC) 5 MG tablet TAKE 1.5 TABLETS BY MOUTH EVERY DAY 135 tablet 1   Azelastine HCl 0.15 % SOLN Place 2 sprays into both nostrils 2 (two) times daily. (Patient taking differently: Place 2 sprays into both nostrils as needed (allergies).) 30 mL 4   BOTOX 100 UNITS SOLR injection Inject into the muscle every 3 (three) months.      busPIRone (BUSPAR) 15 MG tablet TAKE 1 TABLET BY MOUTH TWICE A DAY 180 tablet 1   chlorproMAZINE (THORAZINE) 25 MG tablet Take 50 mg by mouth 2 (two) times daily as needed.     clindamycin (CLEOCIN T) 1 % lotion APPLY TOPICALLY ONCE A DAY TO AREAS OF ACNE ON THE FACE     diclofenac Sodium (VOLTAREN) 1 % GEL Apply 4 g topically 4 (four) times daily. (Patient taking differently: Apply 4 g topically as needed.) 150 g 1   EMGALITY 120 MG/ML SOAJ Inject into the skin as needed.     EPINEPHrine 0.3 mg/0.3 mL IJ SOAJ injection Inject 0.3 mLs (0.3 mg total) into the muscle as needed for up to 1 dose for anaphylaxis (Call 911 if used.). 1 each 1   ezetimibe (ZETIA) 10  MG tablet Take 1 tablet (10 mg total) by mouth daily. 90 tablet 3   fluocinonide ointment (LIDEX) 5.28 % Apply 1 application topically 2 (two) times daily. (Patient taking differently: Apply 1 application  topically as needed.) 30 g 1   fluticasone (FLONASE) 50 MCG/ACT nasal spray Place 2 sprays into both nostrils in the morning  and at bedtime. (Patient taking differently: Place 2 sprays into both nostrils as needed for allergies or rhinitis.) 11.1 mL 11   furosemide (LASIX) 40 MG tablet Take 1 tablet (40 mg total) by mouth daily. 90 tablet 3   glucose blood (ONETOUCH VERIO) test strip TEST FASTING GLUCOSE DAILY AS DIRECTED 100 each 3   hydrocortisone 1 % ointment Apply 1 application topically 2 (two) times daily. To areas of itching not to use if any blisters or open wounds. USE PRN itching. 30 g 2   isosorbide mononitrate (IMDUR) 30 MG 24 hr tablet Take 1 tablet (30 mg total) by mouth daily. 90 tablet 3   lamoTRIgine (LAMICTAL) 100 MG tablet Take 100 mg by mouth 2 (two) times daily.     Lancets (ONETOUCH ULTRASOFT) lancets Test fasting each morning and 2 hours before supper. Retest if having hypoglycemic symptoms. 100 each 12   levocetirizine (XYZAL) 5 MG tablet Take 5 mg by mouth every evening.     metoprolol tartrate (LOPRESSOR) 100 MG tablet TAKE 1 TABLET BY MOUTH TWICE A DAY 180 tablet 2   promethazine (PHENERGAN) 25 MG tablet Take 25 mg by mouth every 4 (four) hours as needed.     rosuvastatin (CRESTOR) 20 MG tablet TAKE 1 TABLET BY MOUTH EVERY DAY 90 tablet 1   Semaglutide,0.25 or 0.'5MG'$ /DOS, (OZEMPIC, 0.25 OR 0.5 MG/DOSE,) 2 MG/3ML SOPN Inject 0.25 mg into the skin once a week. 3 mL 0   TEGRETOL-XR 400 MG 12 hr tablet TAKE 1 TABLET BY MOUTH TWICE A DAY 60 tablet 1   Vilazodone HCl (VIIBRYD) 40 MG TABS TAKE 1 TABLET BY MOUTH EVERY DAY 90 tablet 0   baclofen (LIORESAL) 10 MG tablet Take 10 mg by mouth 2 (two) times daily as needed (for migraines).     metFORMIN (GLUCOPHAGE) 500 MG tablet TAKE 2 TABLETS BY MOUTH IN THE MORNING ,1 TABLET AT LUNCH, & TAKE 2 TABLETS IN THE EVENING 450 tablet 5   No facility-administered medications prior to visit.     Allergies:   Morphine, Morphine and related, Oxycodone, Oxycodone hcl, Aimovig [erenumab-aooe], Augmentin [amoxicillin-pot clavulanate], and Morphine sulfate   Social  History   Socioeconomic History   Marital status: Married    Spouse name: Not on file   Number of children: 2   Years of education: Not on file   Highest education level: 12th grade  Occupational History   Occupation: disabled  Tobacco Use   Smoking status: Former    Types: Cigarettes    Quit date: 01/03/1983    Years since quitting: 39.4   Smokeless tobacco: Never   Tobacco comments:    quit in 1984  Vaping Use   Vaping Use: Never used  Substance and Sexual Activity   Alcohol use: No    Alcohol/week: 0.0 standard drinks of alcohol   Drug use: No   Sexual activity: Never  Other Topics Concern   Not on file  Social History Narrative   ** Merged History Encounter **       She is a married mother of 2, grandmother 3. She tries to get exercise but is not  doing any routine program.   She quit smoking over 25 years ago does not drink alcohol.   Social Determinants of Health   Financial Resource Strain: Low Risk  (11/26/2021)   Overall Financial Resource Strain (CARDIA)    Difficulty of Paying Living Expenses: Not hard at all  Food Insecurity: No Food Insecurity (11/26/2021)   Hunger Vital Sign    Worried About Running Out of Food in the Last Year: Never true    Ran Out of Food in the Last Year: Never true  Transportation Needs: Unmet Transportation Needs (11/26/2021)   PRAPARE - Transportation    Lack of Transportation (Medical): Yes    Lack of Transportation (Non-Medical): Yes  Physical Activity: Insufficiently Active (11/26/2021)   Exercise Vital Sign    Days of Exercise per Week: 7 days    Minutes of Exercise per Session: 20 min  Stress: Stress Concern Present (11/26/2021)   [place]    Feeling of Stress : To some extent  Social Connections: Moderately Isolated (11/26/2021)   Social Connection and Isolation Panel [NHANES]    Frequency of Communication with Friends and Family: More than three times a week     Frequency of Social Gatherings with Friends and Family: More than three times a week    Attends Religious Services: Never    Marine scientist or Organizations: No    Attends Music therapist: Never    Marital Status: Married     Family History:  The patient's family history includes Alzheimer's disease in her maternal grandmother; Dementia in her maternal grandmother; Diabetes in her maternal grandfather; Heart attack in her mother; Hypertension in her maternal grandfather; Sarcoidosis in her mother; Seizures in her mother.   ROS General: Negative; No fevers, chills, or night sweats;  HEENT: Negative; No changes in vision or hearing, sinus congestion, difficulty swallowing Pulmonary: Negative; No cough, wheezing, shortness of breath, hemoptysis Cardiovascular: Negative; No chest pain, presyncope, syncope, palpitations GI: Negative; No nausea, vomiting, diarrhea, or abdominal pain GU: Negative; No dysuria, hematuria, or difficulty voiding Musculoskeletal: Negative; no myalgias, joint pain, or weakness Hematologic/Oncology: Negative; no easy bruising, bleeding Endocrine: Negative; no heat/cold intolerance; no diabetes Neuro: Negative; no changes in balance, headaches Skin: Negative; No rashes or skin lesions Psychiatric: Negative; No behavioral problems, depression Sleep: Positive for OSA, now back on CPAP therapy no bruxism, restless legs, hypnogognic hallucinations, no cataplexy Other comprehensive 14 point system review is negative.   PHYSICAL EXAM:   VS:  BP 132/74   Pulse (!) 59   Ht '5\' 3"'$  (1.6 m)   Wt 195 lb (88.5 kg)   LMP  (LMP Unknown)   SpO2 98%   BMI 34.54 kg/m     Repeat blood pressure by me 124/74  Wt Readings from Last 3 Encounters:  05/25/22 195 lb (88.5 kg)  04/12/22 191 lb 9.6 oz (86.9 kg)  04/07/22 192 lb 12.8 oz (87.5 kg)     General: Alert, oriented, no distress.  Skin: normal turgor, no rashes, warm and dry HEENT: Normocephalic,  atraumatic. Pupils equal round and reactive to light; sclera anicteric; extraocular muscles intact;  Nose without nasal septal hypertrophy Mouth/Parynx benign; Mallinpatti scale 3 Neck: No JVD, no carotid bruits; normal carotid upstroke Lungs: clear to ausculatation and percussion; no wheezing or rales Chest wall: without tenderness to palpitation Heart: PMI not displaced, RRR, s1 s2 normal, 1/6 systolic murmur, no diastolic murmur, no rubs, gallops, thrills, or heaves  Abdomen: soft, nontender; no hepatosplenomehaly, BS+; abdominal aorta nontender and not dilated by palpation. Back: no CVA tenderness Pulses 2+ Musculoskeletal: full range of motion, normal strength, no joint deformities Extremities: no clubbing cyanosis or edema, Homan's sign negative  Neurologic: grossly nonfocal; Cranial nerves grossly wnl Psychologic: Normal mood and affect     Studies/Labs Reviewed:   May 25, 2022 ECG (independently read by me): Sinus bradycardia at 59, no ectopy, PRWP V1-4  August 15, 2020  ECG (independently read by me): NSR at 60; nonspecific T wave  Recent Labs:    Latest Ref Rng & Units 04/12/2022   12:18 PM 04/07/2022   10:51 AM 04/05/2022    2:24 PM  BMP  Glucose 70 - 99 mg/dL 122  114  83   BUN 8 - 27 mg/dL '24  24  28   '$ Creatinine 0.57 - 1.00 mg/dL 2.37  2.50  2.46   BUN/Creat Ratio 12 - '28 10  10  11   '$ Sodium 134 - 144 mmol/L 140  140  142   Potassium 3.5 - 5.2 mmol/L 4.2  4.8  4.5   Chloride 96 - 106 mmol/L 104  104  108   CO2 20 - 29 mmol/L '22  19  17   '$ Calcium 8.7 - 10.3 mg/dL 10.3  10.6  9.9         Latest Ref Rng & Units 04/12/2022   12:18 PM 04/07/2022   10:51 AM 11/05/2021    9:24 AM  Hepatic Function  Total Protein 6.0 - 8.5 g/dL 7.8  7.5  7.3   Albumin 3.9 - 4.9 g/dL  4.6  4.3   AST 0 - 40 IU/L  43  17   ALT 0 - 32 IU/L  41  17   Alk Phosphatase 44 - 121 IU/L  74  68   Total Bilirubin 0.0 - 1.2 mg/dL  <0.2  <0.2        Latest Ref Rng & Units 11/05/2021    9:24  AM 08/15/2020   11:47 AM 02/05/2020   11:28 AM  CBC  WBC 3.4 - 10.8 x10E3/uL 5.5  6.3  5.3   Hemoglobin 11.1 - 15.9 g/dL 9.9  11.3  11.7   Hematocrit 34.0 - 46.6 % 31.5  34.5  35.4   Platelets 150 - 450 x10E3/uL 244  255  238    Lab Results  Component Value Date   MCV 85 11/05/2021   MCV 85 08/15/2020   MCV 85 02/05/2020   Lab Results  Component Value Date   TSH 2.540 04/12/2022   Lab Results  Component Value Date   HGBA1C 6.4 (H) 04/07/2022     BNP    Component Value Date/Time   BNP 8.6 03/29/2022 1243    ProBNP No results found for: "PROBNP"   Lipid Panel     Component Value Date/Time   CHOL 132 11/05/2021 0924   CHOL 277 (H) 08/16/2014 0843   TRIG 100 11/05/2021 0924   TRIG 167 (H) 08/16/2014 0843   HDL 73 11/05/2021 0924   HDL 77 08/16/2014 0843   CHOLHDL 1.8 11/05/2021 0924   CHOLHDL 2.8 02/27/2019 0818   VLDL 22 02/27/2019 0818   VLDL 33 08/16/2014 0843   LDLCALC 41 11/05/2021 0924   LDLCALC 190 (H) 02/28/2017 1009   LDLCALC 167 (H) 08/16/2014 0843   LABVLDL 18 11/05/2021 0924     RADIOLOGY: No results found.   Additional studies/ records that were reviewed today  include:  I have obtained a download from July 05, 2019 through October 02, 2019 when she was still using CPAP.  At 11 cm water pressure AHI was 2.4.  ASSESSMENT:    1. OSA (obstructive sleep apnea)   2. Essential hypertension   3. Hyperlipidemia with target LDL less than 70   4. Palpitations   5. CKD (chronic kidney disease) stage 4, GFR 15-29 ml/min (HCC)   6. Obesity (BMI 30-39.9)     PLAN:  1.  Essential hypertension: Blood pressure today is stable at 124/74 on a regimen of amlodipine 7.5 mg daily, furosemide 40 mg, metoprolol tartrate 100 mg twice a day and she also is on isosorbide 30 mg daily.  2.  OSA: Initially diagnosed in 2007 and was initially followed by Dr. Chancy Milroy.  When seen by me last in 2022 she had stopped CPAP therapy toward the end of 2021.  Subsequently, she has  been back on therapy.  Her current machine is a ResMed AirSense 10 AutoSet unit with set up date December 09, 2017.  [facility] has been her DME but since they no longer in CPAP business per her insurance she will need to switch to a new DME company.  [facility] would [facility] [place] if this is possible otherwise adapt her [employer] may be necessary.  Presently she is going to bed between 830 and 10 PM and waking up between 4 and 5 AM.  She does mouth 3 and has an F30 little by ResMed mask.  Her most recent download shows usage 73% of the days.  I discussed the importance of daily use and usage throughout the nights duration.  Her most recent AHI is 2.9 with a pressure setting at a range of 8 to 16 cm with 95th percentile pressure at 11.3 and maximum average pressure at 12.2.  3.  Hyperlipidemia: Remotely, she had history of significant hyperlipidemia with total cholesterol in October 2021 increasing to 310 and LDL at 198.  Currently she is on rosuvastatin 20 mg and Zetia 10 mg.  Most recent lipid panel in July 2023 showed a total cholesterol 132, HDL 73, LDL 41.  She is now followed by [name], PA-C as her primary provider.  4. Type 2 diabetes mellitus: She is now on Ozempic.  5.  Stage IV CKD: Creatinine in December 2023 was 2.48 with estimated GFR 22.  Most recent creatinine on May 20, 2022 was 2.10.  She is now seeing Dr. Melynda Keller in [place]  I have recommended a 91-monthfollow-up evaluation for her to see AFabian Sharp PA-C and see me in 6 months for follow-up evaluation.  Medication Adjustments/Labs and Tests Ordered: Current medicines are reviewed at length with the patient today.  Concerns regarding medicines are outlined above.  Medication changes, Labs and Tests ordered today are listed in the Patient Instructions below. Patient Instructions    Follow-Up: [facility] you and your health needs are our priority.  As part of our continuing  mission to provide you with exceptional heart care, we have created designated Provider Care Teams.  These Care Teams include your primary Cardiologist (physician) and Advanced Practice Providers (APPs -  Physician Assistants and Nurse Practitioners) who all work together to provide you with the care you need, when you need it.  We recommend signing up for the patient portal called "MyChart".  Sign up information is provided on this After Visit Summary.  MyChart is used to  connect with patients for Virtual Visits (Telemedicine).  Patients are able to view lab/test results, encounter notes, upcoming appointments, etc.  Non-urgent messages can be sent to your provider as well.   To learn more about what you can do with MyChart, go to [email or web address].    Your next appointment:   3 month(s)  Provider:   Fabian Sharp, PA-C    Then, Shelva Majestic, MD will plan to see you again in 6 month(s).       Signed, Shelva Majestic, MD  05/30/2022 9:44 AM    [place] [address] [place], [place], [place]  [address] Phone: [phone]

## 2022-05-25 NOTE — Patient Instructions (Signed)
   Follow-Up: At [facility], you and your health needs are our priority.  As part of our continuing mission to provide you with exceptional heart care, we have created designated Provider Care Teams.  These Care Teams include your primary Cardiologist (physician) and Advanced Practice Providers (APPs -  Physician Assistants and Nurse Practitioners) who all work together to provide you with the care you need, when you need it.  We recommend signing up for the patient portal called "MyChart".  Sign up information is provided on this After Visit Summary.  MyChart is used to connect with patients for Virtual Visits (Telemedicine).  Patients are able to view lab/test results, encounter notes, upcoming appointments, etc.  Non-urgent messages can be sent to your provider as well.   To learn more about what you can do with MyChart, go to [email or web address].    Your next appointment:   3 month(s)  Provider:   Fabian Sharp, PA-C    Then, Shelva Majestic, MD will plan to see you again in 6 month(s).

## 2022-05-26 ENCOUNTER — Telehealth: Payer: Self-pay | Admitting: Cardiovascular Disease

## 2022-05-26 NOTE — Telephone Encounter (Signed)
Order refaxed to [facility]   Receipt confirmed

## 2022-05-26 NOTE — Telephone Encounter (Signed)
Judson Roch is calling from [facility] stating they did not receive the compression stockings order signed by Fabian Sharp on 01/11. They are requesting it be resent to [phone]. Please advise.

## 2022-05-27 ENCOUNTER — Telehealth: Payer: Self-pay | Admitting: Cardiovascular Disease

## 2022-05-27 NOTE — Telephone Encounter (Signed)
Pt c/o medication issue:  1. Name of Medication: isosorbide mononitrate (IMDUR) 30 MG 24 hr tablet   2. How are you currently taking this medication (dosage and times per day)? Take 1 tablet (30 mg total) by mouth daily.   3. Are you having a reaction (difficulty breathing--STAT)? Yes  4. What is your medication issue? The medication is causing real bad headaches for the patient. Calling to see what other options are there

## 2022-05-27 NOTE — Telephone Encounter (Signed)
Spoke with pt, she has been on the isosorbide for about a month now. She did not have headaches in the beginning when she first started taking it, they have just started. She has a history of migraines and does get some relief from the headache with those medications. Okay given for patient to try 1/2 a tablet to see if that will help. Aware the body is supposed to get used to the medication and the headaches should go away. Patient voiced understanding to let us know if headaches continue after reducing to 1/2 tablet daily. Pt agreed with this plan.

## 2022-05-28 ENCOUNTER — Ambulatory Visit (INDEPENDENT_AMBULATORY_CARE_PROVIDER_SITE_OTHER): Payer: Medicare Other | Admitting: Clinical

## 2022-05-28 DIAGNOSIS — F411 Generalized anxiety disorder: Secondary | ICD-10-CM | POA: Diagnosis not present

## 2022-05-28 DIAGNOSIS — F330 Major depressive disorder, recurrent, mild: Secondary | ICD-10-CM

## 2022-05-28 NOTE — Progress Notes (Signed)
                Katherina Right, LCSW

## 2022-05-28 NOTE — Progress Notes (Signed)
[facility] Counselor/Therapist Progress Note  Patient ID: Abigail Figueroa, MRN: [id number],    Date: 05/28/2022  Time Spent: 12:30pm - 1:15pm : 45 minutes  Treatment Type: Individual Therapy  Reported Symptoms: Patient reported depressed mood  Mental Status Exam: Appearance:  Neat     Behavior: Appropriate  Motor: Normal  Speech/Language:  Clear and Coherent  Affect: Appropriate  Mood: depressed  Thought process: normal  Thought content:   WNL  Sensory/Perceptual disturbances:   WNL  Orientation: oriented to person, place, and situation  Attention: Good  Concentration: Good  Memory: WNL  Fund of knowledge:  Good  Insight:   Good  Judgment:  Good  Impulse Control: Good   Risk Assessment: Danger to Self:  No Patient denied current suicidal ideation Self-injurious Behavior: No Danger to Others: No Patient denied current homicidal ideation Duty to Warn:no Physical Aggression / Violence:No  Access to Firearms a concern: No  Gang Involvement:No   Subjective: Patient stated, "they've been going ok" since last session . Patient stated, "we've been trying to communicate but the communication is not there" in regards to communication with husband. Patient reported difficulty communicating with husband due to his tone and message. Patient stated, "I just don't say a whole lot" in response to communication with her husband. Patient stated, "its bothering me" in response to communication between patient and her husband. Patient stated, "I'm trying to deal" in response. Patient reported she was upset that her husband told her to find an item verus asking her to find item for him. Patient reported it upset her when her husband curses at patient. Patient practiced use of "I" statements during session and reported plans to implement use of "I" statements.    Interventions: Cognitive Behavioral Therapy. Clinician conducted session via WebEx video from clinician's home office.  Patient provided verbal consent to proceed with telehealth session and participated in session from patient's home. Reviewed events since last session. Explored and identified challenges in communication between patient and husband and patient's communication in response. Provided psycho education and continued to review use of fair fighting rules and "I" statements. Clinician requested patient practice use of "I" statements in response to situations discussed during session. Reviewed recommendation for martial counseling and provided appropriate referral information.      Diagnosis:  Mild episode of recurrent major depressive disorder (HCC)   Generalized anxiety disorder   Plan: Patient is to utilize [facility], thought re-framing, relaxation techniques, mindfulness, effective communication, and coping strategies to decrease symptoms associated with Major Depressive Disorder and Generalized Anxiety Disorder. Frequency: bi-weekly  Modality: individual      Long-term goal:   Patient stated, "I want to see a change in the anxiety".      Reduce overall level, frequency, and intensity of the symptoms of anxiety and depression as evidenced by decreased sadness, feelings of anxiety, feeling "lost", lack of energy, difficulty falling and staying asleep, changes in concentration, psychomotor retardation, rapid heart rate, excessive worry, restlessness, feeling jittery, and feeling on edge from 5 to 6 days per week to 0 to 1 days per week per patient reported for at least 3 consecutive months.    Target Date: 02/06/23  Progress: progressing    Short-term goal:  Develop coping strategies to utilize in response to symptoms of depression/anxiety and stressors   Target Date: 08/07/22  Progress: progressing    Verbally express patient's thoughts and feelings to others and utilize effective communication strategies when expressing patient's thoughts/feelings  Target Date: 08/07/22   Progress: progressing    Verbalize an understanding of the relationship between symptoms of anxiety and the impact on patient's thought patterns and behaviors   Target Date: 08/07/22  Progress: progressing     Katherina Right, LCSW

## 2022-05-30 ENCOUNTER — Encounter: Payer: Self-pay | Admitting: Cardiovascular Disease

## 2022-06-03 ENCOUNTER — Encounter: Payer: Self-pay | Admitting: Allergy & Immunology

## 2022-06-03 ENCOUNTER — Other Ambulatory Visit: Payer: Self-pay

## 2022-06-03 ENCOUNTER — Ambulatory Visit (INDEPENDENT_AMBULATORY_CARE_PROVIDER_SITE_OTHER): Payer: Medicare Other | Admitting: Allergy & Immunology

## 2022-06-03 VITALS — BP 124/76 | HR 65 | Temp 98.0°F | Resp 18 | Ht 63.0 in | Wt 190.4 lb

## 2022-06-03 DIAGNOSIS — J3089 Other allergic rhinitis: Secondary | ICD-10-CM

## 2022-06-03 DIAGNOSIS — N184 Chronic kidney disease, stage 4 (severe): Secondary | ICD-10-CM | POA: Diagnosis not present

## 2022-06-03 DIAGNOSIS — J302 Other seasonal allergic rhinitis: Secondary | ICD-10-CM

## 2022-06-03 DIAGNOSIS — G43719 Chronic migraine without aura, intractable, without status migrainosus: Secondary | ICD-10-CM | POA: Diagnosis not present

## 2022-06-03 DIAGNOSIS — M542 Cervicalgia: Secondary | ICD-10-CM | POA: Diagnosis not present

## 2022-06-03 DIAGNOSIS — M791 Myalgia, unspecified site: Secondary | ICD-10-CM | POA: Diagnosis not present

## 2022-06-03 MED ORDER — AZELASTINE HCL 0.15 % NA SOLN: 2.0000 | Freq: Two times a day (BID) | NASAL | 11 refills | Status: DC

## 2022-06-03 MED ORDER — LORATADINE 10 MG PO TABS: 10.0000 mg | ORAL_TABLET | Freq: Two times a day (BID) | ORAL | 11 refills | Status: DC

## 2022-06-03 MED ORDER — FLUTICASONE PROPIONATE 50 MCG/ACT NA SUSP: 2.0000 | Freq: Two times a day (BID) | NASAL | 11 refills | Status: DC

## 2022-06-03 NOTE — Patient Instructions (Addendum)
1. Perennial and seasonal allergic rhinitis (red cedar,ragweed, trees, outdoor molds, dog, cat, and dust mite) - We are going to change your medications around since you have been diagnosed with chronic kidney disease. - Start Claritin (loratadine) up to twice daily. - Continue fluticasone one spray per nostril up to twice daily. - Add on azelastine one spray per nostril up to twice daily. - Consider allergy shots for long term control.  2. Return in about 6 months (around 12/02/2022).    Please inform us of any Emergency Department visits, hospitalizations, or changes in symptoms. Call us before going to the ED for breathing or allergy symptoms since we might be able to fit you in for a sick visit. Feel free to contact us anytime with any questions, problems, or concerns.  It was a pleasure to see you and your family again today!  Websites that have reliable patient information: 1. American Academy of Asthma, Allergy, and Immunology: www.aaaai.org 2. Food Allergy Research and Education (FARE): foodallergy.org 3. Mothers of Asthmatics: http://www.asthmacommunitynetwork.org 4. American College of Allergy, Asthma, and Immunology: www.acaai.org   COVID-19 Vaccine Information can be found at: [email or web address].co.uk For questions related to vaccine distribution or appointments, please email [email or web address] or call [phone].   We realize that you might be concerned about having an allergic reaction to the COVID19 vaccines. To help with that concern, WE ARE OFFERING THE COVID19 VACCINES IN OUR OFFICE! Ask the front desk for dates!     "Like" [place] on Facebook and Instagram for our latest updates!      A healthy democracy works best when [employer] participate! Make sure you are registered to vote! If you have moved or changed any of your contact information, you will need to get this updated before voting!  In some cases, you MAY  be able to register to vote online: [email or web address]     Allergy Shots  Allergies are the result of a chain reaction that starts in the immune system. Your immune system controls how your body defends itself. For instance, if you have an allergy to pollen, your immune system identifies pollen as an invader or allergen. Your immune system overreacts by producing antibodies called Immunoglobulin E (IgE). These antibodies travel to cells that release chemicals, causing an allergic reaction.  The concept behind allergy immunotherapy, whether it is received in the form of shots or tablets, is that the immune system can be desensitized to specific allergens that trigger allergy symptoms. Although it requires time and patience, the payback can be long-term relief. Allergy injections contain a dilute solution of those substances that you are allergic to based upon your skin testing and allergy history.   How Do Allergy Shots Work?  Allergy shots work much like a vaccine. Your body responds to injected amounts of a particular allergen given in increasing doses, eventually developing a resistance and tolerance to it. Allergy shots can lead to decreased, minimal or no allergy symptoms.  There generally are two phases: build-up and maintenance. Build-up often ranges from three to six months and involves receiving injections with increasing amounts of the allergens. The shots are typically given once or twice a week, though more rapid build-up schedules are sometimes used.  The maintenance phase begins when the most effective dose is reached. This dose is different for each person, depending on how allergic you are and your response to the build-up injections. Once the maintenance dose is reached, there are longer periods between injections, typically two to four weeks.  Occasionally  doctors give cortisone-type shots that can temporarily reduce allergy symptoms. These types of  shots are different and should not be confused with allergy immunotherapy shots.  Who Can Be Treated with Allergy Shots?  Allergy shots may be a good treatment approach for people with allergic rhinitis (hay fever), allergic asthma, conjunctivitis (eye allergy) or stinging insect allergy.   Before deciding to begin allergy shots, you should consider:   The length of allergy season and the severity of your symptoms  Whether medications and/or changes to your environment can control your symptoms  Your desire to avoid long-term medication use  Time: allergy immunotherapy requires a major time commitment  Cost: may vary depending on your insurance coverage  Allergy shots for children age 40 and older are effective and often well tolerated. They might prevent the onset of new allergen sensitivities or the progression to asthma.  Allergy shots are not started on patients who are pregnant but can be continued on patients who become pregnant while receiving them. In some patients with other medical conditions or who take certain common medications, allergy shots may be of risk. It is important to mention other medications you talk to your allergist.   What are the two types of build-ups offered:   RUSH or Rapid Desensitization -- one day of injections lasting from 8:30-4:30pm, injections every 1 hour.  Approximately half of the build-up process is completed in that one day.  The following week, normal build-up is resumed, and this entails ~16 visits either weekly or twice weekly, until reaching your "maintenance dose" which is continued weekly until eventually getting spaced out to every month for a duration of 3 to 5 years. The regular build-up appointments are nurse visits where the injections are administered, followed by required monitoring for 30 minutes.    Traditional build-up -- weekly visits for 6 -12 months until reaching "maintenance dose", then continue weekly until eventually spacing  out to every 4 weeks as above. At these appointments, the injections are administered, followed by required monitoring for 30 minutes.     Either way is acceptable, and both are equally effective. With the rush protocol, the advantage is that less time is spent here for injections overall AND you would also reach maintenance dosing faster (which is when the clinical benefit starts to become apparent).  Not everyone is a candidate for rapid desensitization.   IF we proceed with the RUSH protocol, there are premedications which must be taken the day before and the day after the rush only (this includes antihistamines, steroids, and Singulair).  After the rush day, no prednisone or Singulair is required, and we just recommend antihistamines taken on your injection day.  What Is An Estimate of the Costs?  If you are interested in starting allergy injections, please check with your insurance company about your coverage for both allergy vial sets and allergy injections.  Please do so prior to making the appointment to start injections.  The following are CPT codes to give to your insurance company. These are the amounts we bill to [employer], but the amount you will pay and we receive is substantially less, depending on the contracts we have with different insurance companies.   Amount Billed to Insurance One allergy vial set  CPT 95165   $ 1200     Two allergy vial set  CPT 95165   $ 2400     Three allergy vial set  CPT 95165   $ 3600  One injection   CPT 95115   $ 35  Two injections   CPT 95117   $ 40 RUSH (Rapid Desensitization) CPT 95180 x 8 hours $500/hour  Regarding the allergy injections, your co-pay may or may not apply with each injection, so please confirm this with your insurance company. When you start allergy injections, 1 or 2 sets of vials are made based on your allergies.  Not all patients can be on one set of vials. A set of vials lasts 6 months to a year depending on how  quickly you can proceed with your build-up of your allergy injections. Vials are personalized for each patient depending on their specific allergens.  How often are allergy injection given during the build-up period?   Injections are given at least weekly during the build-up period until your maintenance dose is achieved. Per the doctor's discretion, you may have the option of getting allergy injections two times per week during the build-up period. However, there must be at least 48 hours between injections. The build-up period is usually completed within 6-12 months depending on your ability to schedule injections and for adjustments for reactions. When maintenance dose is reached, your injection schedule is gradually changed to every two weeks and later to every three weeks. Injections will then continue every 4 weeks. Usually, injections are continued for a total of 3-5 years.   When Will I Feel Better?  Some may experience decreased allergy symptoms during the build-up phase. For others, it may take as long as 12 months on the maintenance dose. If there is no improvement after a year of maintenance, your allergist will discuss other treatment options with you.  If you aren't responding to allergy shots, it may be because there is not enough dose of the allergen in your vaccine or there are missing allergens that were not identified during your allergy testing. Other reasons could be that there are high levels of the allergen in your environment or major exposure to non-allergic triggers like tobacco smoke.  What Is the Length of Treatment?  Once the maintenance dose is reached, allergy shots are generally continued for three to five years. The decision to stop should be discussed with your allergist at that time. Some people may experience a permanent reduction of allergy symptoms. Others may relapse and a longer course of allergy shots can be considered.  What Are the Possible Reactions?  The  two types of adverse reactions that can occur with allergy shots are local and systemic. Common local reactions include very mild redness and swelling at the injection site, which can happen immediately or several hours after. Report a delayed reaction from your last injection. These include arm swelling or runny nose, watery eyes or cough that occurs within 12-24 hours after injection. A systemic reaction, which is less common, affects the entire body or a particular body system. They are usually mild and typically respond quickly to medications. Signs include increased allergy symptoms such as sneezing, a stuffy nose or hives.   Rarely, a serious systemic reaction called anaphylaxis can develop. Symptoms include swelling in the throat, wheezing, a feeling of tightness in the chest, nausea or dizziness. Most serious systemic reactions develop within 30 minutes of allergy shots. This is why it is strongly recommended you wait in your doctor's office for 30 minutes after your injections. Your allergist is trained to watch for reactions, and his or her staff is trained and equipped with the proper medications to identify and treat  them.   Report to the nurse immediately if you experience any of the following symptoms: swelling, itching or redness of the skin, hives, watery eyes/nose, breathing difficulty, excessive sneezing, coughing, stomach pain, diarrhea, or light headedness. These symptoms may occur within 15-20 minutes after injection and may require medication.   Who Should Administer Allergy Shots?  The preferred location for receiving shots is your prescribing allergist's office. Injections can sometimes be given at another facility where the physician and staff are trained to recognize and treat reactions, and have received instructions by your prescribing allergist.  What if I am late for an injection?   Injection dose will be adjusted depending upon how many days or weeks you are late for your  injection.   What if I am sick?   Please report any illness to the nurse before receiving injections. She may adjust your dose or postpone injections depending on your symptoms. If you have fever, flu, sinus infection or chest congestion it is best to postpone allergy injections until you are better. Never get an allergy injection if your asthma is causing you problems. If your symptoms persist, seek out medical care to get your health problem under control.  What If I am or Become Pregnant:  Women that become pregnant should schedule an appointment with [facility] before receiving any further allergy injections.

## 2022-06-03 NOTE — Progress Notes (Signed)
FOLLOW UP  Date of Service/Encounter:  06/03/22   Assessment:   Angioedema - with negative testing to the most common foods   Seasonal and perennial allergic rhinitis (ragweeds, trees, outdoor molds, cat, dust mite) - without any clear clinical reactivity in the form of allergic rhinitis   Stage IV chronic kidney disease  Fully vaccinated to COVID-19, including a booster    Plan/Recommendations:   1. Perennial and seasonal allergic rhinitis (red cedar,ragweed, trees, outdoor molds, dog, cat, and dust mite) - We are going to change your medications around since you have been diagnosed with chronic kidney disease. - Start Claritin (loratadine) up to twice daily. - Continue fluticasone one spray per nostril up to twice daily. - Add on azelastine one spray per nostril up to twice daily. - Consider allergy shots for long term control.  2. Return in about 6 months (around 12/02/2022).    Subjective:   Abigail Figueroa is a 62 y.o. female presenting today for follow up of  Chief Complaint  Patient presents with   Allergic Rhinitis     Some runny nose this morning - allergic to the tree next door to them    Other    Xyzal is not good for her kidney functions would like it change. Stopped two weeks ago.     Abigail Figueroa has a history of the following: Patient Active Problem List   Diagnosis Date Noted   CKD (chronic kidney disease) stage 4, GFR 15-29 ml/min (HCC) 06/03/2022   Pruritus 01/16/2021   Seasonal and perennial allergic rhinitis 10/11/2019   Angio-edema 08/30/2019   Allergic reaction 08/30/2019   Morbid obesity due to excess calories ([place]) 02/06/2019   Osteoarthrosis of hand 12/28/2017   Special screening for malignant neoplasms, colon    Rhinitis, allergic 06/18/2017   Right-sided Bell's palsy 02/02/2016   Spondylolisthesis of lumbar region 08/25/2015   Type II diabetes mellitus (HCC)    Abnormal stress test    Hypercholesterolemia    Abnormal nuclear  stress test 07/30/2015   Preoperative clearance 07/22/2015   Lower extremity edema 07/22/2015   History of brain disorder 11/05/2014   H/O: obesity 11/05/2014   Depression, major, recurrent, moderate ([place]) 11/05/2014   Difficulty with family 11/05/2014   Feeling stressed out 11/05/2014   H/O: HTN (hypertension) 11/05/2014   H/O elevated lipids 11/05/2014   Anxiety, generalized 11/05/2014   Migraine headache 11/05/2014   Moderate recurrent major depression ([place]) 10/16/2014   Generalized anxiety disorder 10/16/2014   Edema 10/14/2014   Depressive disorder 10/14/2014   Obstructive sleep apnea 10/14/2014   Diabetes ([place]) 10/14/2014   Essential hypertension 10/14/2014   Epilepsy ([place]) 10/14/2014   Migraine 10/14/2014   Hyperlipemia 10/14/2014   Dysthymic disorder 10/14/2014   Seizure ([place]) 10/14/2014   Hypercholesteremia 10/14/2014   Palpitation 10/14/2014   Depression 10/14/2014   Dyspepsia 10/14/2014   Hematochezia 10/14/2014   Urge incontinence 10/14/2014   Hair thinning 10/14/2014   Dermatitis 09/10/2014   Acid indigestion 09/10/2014   Alopecia 09/10/2014   Blood in feces 09/10/2014   H/O hypercholesterolemia 09/10/2014   Arthralgia of hip 09/10/2014   Feeling bilious 09/10/2014   Awareness of heartbeats 09/10/2014   Pain in the wrist 09/10/2014   Convulsions ([place]) 09/10/2014   Urge incontinence 09/10/2014   Unspecified convulsions ([place]) 09/10/2014   Snapping thumb syndrome 04/19/2014   Diabetes mellitus ([place]) 03/14/2014   De Quervain's disease (radial styloid tenosynovitis) 03/08/2014   LBP (low back pain) 03/07/2013  Mixed hyperlipidemia 01/15/2013   Encounter for long-term (current) use of other medications - statin 01/15/2013   Fatigue 01/15/2013   Heart palpation 01/15/2013   Obesity (BMI 30-39.9) 01/02/2013   CCF (congestive cardiac failure) ([place]) 11/13/2009   Congestive heart failure ([place]) 11/13/2009   Malaise and fatigue 02/17/2009   Sprain and strain of  interphalangeal (joint) of hand 10/23/2007   HEADACHE 06/23/2007   Diabetes mellitus type 2 in obese ([place]) 05/08/2007   HYPERCALCEMIA 05/08/2007   Sleep apnea 05/08/2007   Edema 05/08/2007   DIARRHEA, CHRONIC 05/08/2007   Clinical depression 10/20/2006   Obstructive apnea 03/22/2006   Obstructive sleep apnea syndrome 03/22/2006   Diabetes mellitus, type 2 ([place]) 11/12/2005   Depression, neurotic 11/12/2005   Epilepsy ([place]) 11/12/2005   Combined fat and carbohydrate induced hyperlipemia 11/12/2005   Acute onset aura migraine 11/12/2005    History obtained from: chart review and patient and her husband Nicole Kindred .  [name] is a 62 y.o. female presenting for a follow up visit.  She was last seen in January 2022 via televisit.  At that time, she was doing well as long as she took regularly with cetirizine every day.  She had multiple positives on skin testing, but no frank symptoms.  We recommended seeing her again in 1 year or earlier if needed.  In the interim, she called in March 2022 reporting vision changes.  I did confirm that she was not having floaters or eye pain or photophobia.  We added on cetirizine at night and [place] 1 drop per eye daily.  Since last visit, she has done well. She has had some deaths in her 10 which messed up her follow up visits.  She reports today that she is here that she needs refill. She has since been diagnosed with stage 4 CKD. This was discovered since we last saw her. She thinks that this is related to her diabetes and she has hypertension that she has not been controlling. She had chronic diarrhea that has led to dehydration. She is not on dialysis yet. She is now making better decision to keep her healthier.  She is followed by Dr. Gwenith Spitz, MD at [facility].  Allergic Rhinitis Symptom History: She has had rhinorrhea lately. February is particularly bad.  She was previously on levocetirizine daily and her nasal spray as needed.  This combination was  working very well.  However, she was told that she needed to choose a different regimen with her allergist because of her new onset of chronic kidney disease.  February is really the worst month for her.  Despite her sensitizations, the rest of the year is not bad at all.  Skin Symptom History: Hives have largely resolved. She has not had any breakouts in years.  Levocetirizine was working very well.  She thinks she has been on Claritin in the past.  Otherwise, there have been no changes to her past medical history, surgical history, family history, or social history.    Review of Systems  Constitutional: Negative.  Negative for fever, malaise/fatigue and weight loss.  HENT: Negative.  Negative for congestion, ear discharge and ear pain.   Eyes:  Negative for pain, discharge and redness.  Respiratory:  Negative for cough, sputum production, shortness of breath and wheezing.   Cardiovascular: Negative.  Negative for chest pain and palpitations.  Gastrointestinal:  Negative for abdominal pain and heartburn.  Skin: Negative.  Negative for itching and rash.  Neurological:  Negative for dizziness and headaches.  Endo/Heme/Allergies:  Negative for environmental allergies. Does not bruise/bleed easily.       Objective:   Blood pressure 124/76, pulse 65, temperature 98 F (36.7 C), resp. rate 18, height '5\' 3"'$  (1.6 m), weight 190 lb 6.4 oz (86.4 kg), SpO2 98 %. Body mass index is 33.73 kg/m.    Physical Exam Constitutional:      Appearance: She is well-developed.     Comments: Very pleasant talkative female.  HENT:     Head: Normocephalic and atraumatic.     Right Ear: Tympanic membrane, ear canal and external ear normal. No drainage, swelling or tenderness. Tympanic membrane is not injected, scarred, erythematous, retracted or bulging.     Left Ear: Tympanic membrane, ear canal and external ear normal. No drainage, swelling or tenderness. Tympanic membrane is not injected, scarred,  erythematous, retracted or bulging.     Nose: Rhinorrhea present. No nasal deformity, septal deviation or mucosal edema.     Right Turbinates: Enlarged and swollen.     Left Turbinates: Enlarged and swollen.     Right Sinus: No maxillary sinus tenderness or frontal sinus tenderness.     Left Sinus: No maxillary sinus tenderness or frontal sinus tenderness.     Mouth/Throat:     Mouth: Mucous membranes are not pale and not dry.     Pharynx: Uvula midline.  Eyes:     General:        Right eye: No discharge.        Left eye: No discharge.     Conjunctiva/sclera: Conjunctivae normal.     Right eye: Right conjunctiva is not injected. No chemosis.    Left eye: Left conjunctiva is not injected. No chemosis.    Pupils: Pupils are equal, round, and reactive to light.  Cardiovascular:     Rate and Rhythm: Normal rate and regular rhythm.     Heart sounds: Normal heart sounds.  Pulmonary:     Effort: Pulmonary effort is normal. No tachypnea, accessory muscle usage or respiratory distress.     Breath sounds: Normal breath sounds. No wheezing, rhonchi or rales.     Comments: Moving air well in all lung fields. Chest:     Chest wall: No tenderness.  Lymphadenopathy:     Head:     Right side of head: No submandibular, tonsillar or occipital adenopathy.     Left side of head: No submandibular, tonsillar or occipital adenopathy.     Cervical: No cervical adenopathy.  Skin:    Coloration: Skin is not pale.     Findings: No abrasion, erythema, petechiae or rash. Rash is not papular, urticarial or vesicular.  Neurological:     Mental Status: She is alert.  Psychiatric:        Behavior: Behavior is cooperative.      Diagnostic studies: none    Salvatore Marvel, MD  [facility]

## 2022-06-04 ENCOUNTER — Other Ambulatory Visit: Payer: Self-pay | Admitting: Physician Assistant

## 2022-06-04 ENCOUNTER — Ambulatory Visit (INDEPENDENT_AMBULATORY_CARE_PROVIDER_SITE_OTHER): Payer: Medicare Other | Admitting: Clinical

## 2022-06-04 ENCOUNTER — Telehealth: Payer: Self-pay | Admitting: Physician Assistant

## 2022-06-04 DIAGNOSIS — F330 Major depressive disorder, recurrent, mild: Secondary | ICD-10-CM | POA: Diagnosis not present

## 2022-06-04 DIAGNOSIS — R569 Unspecified convulsions: Secondary | ICD-10-CM

## 2022-06-04 DIAGNOSIS — F411 Generalized anxiety disorder: Secondary | ICD-10-CM | POA: Diagnosis not present

## 2022-06-04 NOTE — Progress Notes (Signed)
                Leeya Rusconi, LCSW 

## 2022-06-04 NOTE — Telephone Encounter (Signed)
Requested Prescriptions  Pending Prescriptions Disp Refills   TEGRETOL-XR 400 MG 12 hr tablet [Pharmacy Med Name: TEGRETOL XR 400 MG TABLET] 180 tablet 2    Sig: TAKE 1 TABLET BY MOUTH TWICE A DAY     Neurology:  Anticonvulsants - carbamazepine Failed - 06/04/2022 11:16 AM      Failed - AST in normal range and within 360 days    AST  Date Value Ref Range Status  04/07/2022 43 (H) 0 - 40 IU/L Final   SGOT(AST)  Date Value Ref Range Status  08/16/2014 54 (H) U/L Final    Comment:    15-41 NOTE: New Reference Range  07/09/14          Failed - ALT in normal range and within 360 days    ALT  Date Value Ref Range Status  04/07/2022 41 (H) 0 - 32 IU/L Final   SGPT (ALT)  Date Value Ref Range Status  08/16/2014 49 U/L Final    Comment:    14-54 NOTE: New Reference Range  07/09/14          Failed - HGB in normal range and within 360 days    Hemoglobin  Date Value Ref Range Status  11/05/2021 9.9 (L) 11.1 - 15.9 g/dL Final         Failed - HCT in normal range and within 360 days    Hematocrit  Date Value Ref Range Status  11/05/2021 31.5 (L) 34.0 - 46.6 % Final         Failed - Cr in normal range and within 360 days    Creat  Date Value Ref Range Status  02/28/2017 1.03 0.50 - 1.05 mg/dL Final    Comment:    For patients >58 years of age, the reference limit for Creatinine is approximately 13% higher for people identified as African-American. .    Creatinine, Ser  Date Value Ref Range Status  04/12/2022 2.37 (H) 0.57 - 1.00 mg/dL Final   Creatinine, POC  Date Value Ref Range Status  07/20/2016 NA mg/dL Final         Passed - Carbamazepine (serum) in normal range and within 360 days    Carbamazepine, Total  Date Value Ref Range Status  08/09/2013 4.9 4.0 - 12.0 mcg/mL Final   Carbamazepine (Tegretol), S  Date Value Ref Range Status  12/16/2021 5.2 4.0 - 12.0 ug/mL Final    Comment:             In conjunction with other antiepileptic drugs                                 Therapeutic  4.0 -  8.0                                Toxicity     9.0 - 12.0                                    Carbamazepine alone                                Therapeutic  8.0 - 12.0  Detection Limit =  2.0                           <2.0 indicates None Detected          Passed - WBC in normal range and within 360 days    WBC  Date Value Ref Range Status  11/05/2021 5.5 3.4 - 10.8 x10E3/uL Final  02/27/2019 5.3 4.0 - 10.5 K/uL Final         Passed - PLT in normal range and within 360 days    Platelets  Date Value Ref Range Status  11/05/2021 244 150 - 450 x10E3/uL Final         Passed - Na in normal range and within 360 days    Sodium  Date Value Ref Range Status  04/12/2022 140 134 - 144 mmol/L Final  08/16/2014 144 mmol/L Final    Comment:    135-145 NOTE: New Reference Range  07/09/14          Passed - Completed PHQ-2 or PHQ-9 in the last 360 days      Passed - Valid encounter within last 12 months    Recent Outpatient Visits           1 month ago Abnormal renal function   [place], [place], PA-C   3 months ago Type 2 diabetes mellitus without complication, without long-term current use of insulin ([place])   [place], [place], PA-C   5 months ago Type 2 diabetes mellitus without complication, without long-term current use of insulin ([place])   [place], [place], PA-C   7 months ago Type 2 diabetes mellitus without complication, without long-term current use of insulin ([place])   [place], [place], PA-C   8 months ago Type 2 diabetes mellitus without complication, without long-term current use of insulin ([place])   [place], [place], PA-C       Future Appointments             In 2 months [place], Tami Lin, [place] at  [facility]   In 6 months [place] Bowler, Gwenith Daily, MD [place] [facility] [place] at [facility]

## 2022-06-04 NOTE — Telephone Encounter (Signed)
Medication Refill - Medication:  TEGRETOL-XR 400 MG 12 hr tablet [[id number]]   Pt reports that she took her last pill last night. Has the patient contacted their pharmacy? Yes.   (Agent: If no, request that the patient contact the pharmacy for the refill. If patient does not wish to contact the pharmacy document the reason why and proceed with request.) (Agent: If yes, when and what did the pharmacy advise?)  Preferred Pharmacy (with phone number or street name):  [id number] [id number] Phone: [phone]: [phone] Hours: Not open 24 hours   Has the patient been seen for an appointment in the last year OR does the patient have an upcoming appointment? Yes.    Agent: Please be advised that RX refills may take up to 3 business days. We ask that you follow-up with your pharmacy.

## 2022-06-04 NOTE — Telephone Encounter (Signed)
Pharmacy requested this medication today in another encounter, awaiting provider approval.

## 2022-06-04 NOTE — Progress Notes (Signed)
[place] Counselor/Therapist Progress Note  Patient ID: Abigail Figueroa, MRN: [id number],    Date: 06/04/2022  Time Spent: 12:31pm - 1:17pm : 46 minutes   Treatment Type: Individual Therapy  Reported Symptoms: Patient stated, "its pretty good" in response to current mood.   Mental Status Exam: Appearance:  Neat     Behavior: Appropriate  Motor: Normal  Speech/Language:  Clear and Coherent  Affect: Appropriate  Mood: normal  Thought process: normal  Thought content:   WNL  Sensory/Perceptual disturbances:   WNL  Orientation: oriented to person, place, and situation  Attention: Good  Concentration: Good  Memory: WNL  Fund of knowledge:  Good  Insight:   Good  Judgment:  Good  Impulse Control: Good   Risk Assessment: Danger to Self:  No Patient denied current suicidal ideation Self-injurious Behavior: No Danger to Others: No Patient denied current homicidal ideation Duty to Warn:no Physical Aggression / Violence:No  Access to Firearms a concern: No  Gang Involvement:No   Subjective: Patient reported no changes since last session. Patient stated, "I did do some homework". Patient reported she spoke with her husband about participation in marital counseling and he responded that he would have to think about marital counseling. Patient reported she plans to follow up with husband regarding martial counseling. Patient reported she attempted to use "I" statements when communicating with her husband since last session and reported husband did not respond. Patient reported she and husband have participated in marital counseling in the past and reported it was helpful at the beginning. Patient reported she vocalized to her husband that she felt they are not getting along and she needs peace in the home. Patient stated, "I feel I did my part in letting him know" and stated, "It felt good" in response to verbalizing her feelings. Patient reported her kidney functioning  has remained stable and her physician started patient on ozempic since last session.    Interventions: Cognitive Behavioral Therapy. Clinician conducted session via WebEx video from clinician's home office. Patient provided verbal consent to proceed with telehealth session and participated in session from patient's home. Reviewed events since last session. Discussed husband's response to recommendation for martial counseling. Processed patient's thoughts/feelings related to husband's response. Reviewed patient's use of "I" statements during conversation with husband. Discussed communication strategies for patient to utilize during follow up discussion with husband regarding martial counseling. Reviewed status of patient's health. Clinician requested patient complete thought record for homework.     Diagnosis:  Mild episode of recurrent major depressive disorder (HCC)   Generalized anxiety disorder   Plan: Patient is to utilize [facility], thought re-framing, relaxation techniques, mindfulness, effective communication, and coping strategies to decrease symptoms associated with Major Depressive Disorder and Generalized Anxiety Disorder. Frequency: bi-weekly  Modality: individual      Long-term goal:   Patient stated, "I want to see a change in the anxiety".      Reduce overall level, frequency, and intensity of the symptoms of anxiety and depression as evidenced by decreased sadness, feelings of anxiety, feeling "lost", lack of energy, difficulty falling and staying asleep, changes in concentration, psychomotor retardation, rapid heart rate, excessive worry, restlessness, feeling jittery, and feeling on edge from 5 to 6 days per week to 0 to 1 days per week per patient reported for at least 3 consecutive months.    Target Date: 02/06/23  Progress: progressing    Short-term goal:  Develop coping strategies to utilize in response to symptoms of  depression/anxiety and stressors    Target Date: 08/07/22  Progress: progressing    Verbally express patient's thoughts and feelings to others and utilize effective communication strategies when expressing patient's thoughts/feelings   Target Date: 08/07/22  Progress: progressing    Verbalize an understanding of the relationship between symptoms of anxiety and the impact on patient's thought patterns and behaviors   Target Date: 08/07/22  Progress: progressing    Katherina Right, LCSW

## 2022-06-08 ENCOUNTER — Telehealth: Payer: Self-pay | Admitting: Physician Assistant

## 2022-06-08 DIAGNOSIS — H47233 Glaucomatous optic atrophy, bilateral: Secondary | ICD-10-CM | POA: Diagnosis not present

## 2022-06-08 DIAGNOSIS — H547 Unspecified visual loss: Secondary | ICD-10-CM | POA: Diagnosis not present

## 2022-06-08 DIAGNOSIS — E279 Disorder of adrenal gland, unspecified: Secondary | ICD-10-CM | POA: Diagnosis not present

## 2022-06-08 DIAGNOSIS — N281 Cyst of kidney, acquired: Secondary | ICD-10-CM | POA: Diagnosis not present

## 2022-06-08 DIAGNOSIS — E278 Other specified disorders of adrenal gland: Secondary | ICD-10-CM | POA: Diagnosis not present

## 2022-06-08 DIAGNOSIS — N2889 Other specified disorders of kidney and ureter: Secondary | ICD-10-CM | POA: Diagnosis not present

## 2022-06-08 DIAGNOSIS — K862 Cyst of pancreas: Secondary | ICD-10-CM | POA: Diagnosis not present

## 2022-06-08 NOTE — Telephone Encounter (Signed)
Spoke with [employer] and was told that OV notes/letter needed about extremity swelling to get coverage for patient's compression stockings.  Faxed at 2:33

## 2022-06-08 NOTE — Telephone Encounter (Signed)
States they need pt OV visit note about their legs faxed over to them. Please advise.     Fax: [phone]

## 2022-06-11 ENCOUNTER — Telehealth: Payer: Self-pay

## 2022-06-11 MED ORDER — EPINEPHRINE 0.3 MG/0.3ML IJ SOAJ: 0.3000 mg | Freq: Once | INTRAMUSCULAR | 1 refills | Status: AC

## 2022-06-11 NOTE — Telephone Encounter (Signed)
Patient called in - DOB/Pharmacy verified - stated Loratadine (Claritin) is not covered by her insurance. Patient is also wanting to know provider's opinion on her starting allergy injections given her health condition - Stage 4 Kidney CKD.  Patient advised message would be forwarded to provider for next step. Patient will keep/be updated via her myChart. Patient advised she can send provider any additional questions via her myChart as well.  Patient advised allergy injection packet would be mailed to her going over everything we discussed on the telephone w/self addressed envelope for her to mail back once she has signed consent form and allergy injection estimate form. Patient advised to contact her insurance  - CPT codes on form - regarding cost and copay. Patient advised 2 allergyvial set may be needed - provider will make that determination.  Sent in medication refill on Epi Pen. Patient stated she will see what the cost is OTC Loratadine (Claritin).  Patient verbalized understanding to all, no further questions.

## 2022-06-11 NOTE — Telephone Encounter (Signed)
We should do a PA to get Claritin covered.  With her kidney disease, that is the safest want to use.  The other antihistamines are almost exclusively cleared by the kidneys.  Allergy shots would not be a problem.  They will actually help her to get off antihistamines over the long-term, which is good because we do not have a lot of antihistamine options with her kidney disease.  Salvatore Marvel, MD [facility] [place]

## 2022-06-11 NOTE — Telephone Encounter (Signed)
Can we please initiate a PA for Claritin?

## 2022-06-14 ENCOUNTER — Telehealth: Payer: Self-pay | Admitting: Cardiovascular Disease

## 2022-06-14 NOTE — Telephone Encounter (Signed)
What problem are you experiencing? Mask does not go in nose properly   Who is your medical equipment company? [employer]  Also states Dr. Claiborne Billings advised her at her last appt he was going to find her another local CPAP company, but she has not heard anything.   Please route to the sleep study assistant.

## 2022-06-15 ENCOUNTER — Ambulatory Visit (INDEPENDENT_AMBULATORY_CARE_PROVIDER_SITE_OTHER): Payer: Medicare Other | Admitting: Physician Assistant

## 2022-06-15 VITALS — BP 124/75 | HR 64 | Wt 190.1 lb

## 2022-06-15 DIAGNOSIS — E782 Mixed hyperlipidemia: Secondary | ICD-10-CM | POA: Diagnosis not present

## 2022-06-15 DIAGNOSIS — K5900 Constipation, unspecified: Secondary | ICD-10-CM

## 2022-06-15 DIAGNOSIS — E119 Type 2 diabetes mellitus without complications: Secondary | ICD-10-CM

## 2022-06-15 DIAGNOSIS — L84 Corns and callosities: Secondary | ICD-10-CM

## 2022-06-15 DIAGNOSIS — E669 Obesity, unspecified: Secondary | ICD-10-CM

## 2022-06-15 DIAGNOSIS — R600 Localized edema: Secondary | ICD-10-CM

## 2022-06-15 DIAGNOSIS — N184 Chronic kidney disease, stage 4 (severe): Secondary | ICD-10-CM

## 2022-06-15 DIAGNOSIS — L299 Pruritus, unspecified: Secondary | ICD-10-CM

## 2022-06-15 NOTE — Progress Notes (Unsigned)
I,[name],acting as a Education administrator for [employer], PA-C.,have documented all relevant documentation on the behalf of [name], PA-C,as directed by  [employer], PA-C while in the presence of [employer], PA-C.   Established patient visit   Patient: Abigail Figueroa   DOB: 1961-02-20   62 y.o. Female  MRN: [id number] Visit Date: 06/15/2022  Today's healthcare provider: [name], PA-C   CC: pruritus and constipation  Subjective    HPI  Patient reports she has been having some itching and constipation with itching starting in December with it remaining the same since it started. Reports her constipation started ever since stopping metformin. Patient reports having 2 bowel movement in a week. Feels if she is intaking plenty of fiber and water as well as herbal tea. Patient reports using sona lotion and states it giver her minor relief as well as using aquaphor. Feels her itching is coming from chronic kidney concerns and has went to nephrology and was told that it is too early for those symptoms and patient feels that isn't true. States her scalp and upper abdominal area seem to be the worse along with the top of her back and arms.    Medications: Outpatient Medications Prior to Visit  Medication Sig   amLODipine (NORVASC) 5 MG tablet TAKE 1.5 TABLETS BY MOUTH EVERY DAY   Azelastine HCl 0.15 % SOLN Place 2 sprays into both nostrils 2 (two) times daily.   BOTOX 100 UNITS SOLR injection Inject into the muscle every 3 (three) months.    busPIRone (BUSPAR) 15 MG tablet TAKE 1 TABLET BY MOUTH TWICE A DAY   carbamazepine (TEGRETOL-XR) 400 MG 12 hr tablet TAKE 1 TABLET BY MOUTH TWICE A DAY   chlorproMAZINE (THORAZINE) 25 MG tablet Take 50 mg by mouth 2 (two) times daily as needed.   clindamycin (CLEOCIN T) 1 % lotion APPLY TOPICALLY ONCE A DAY TO AREAS OF ACNE ON THE FACE   diclofenac Sodium (VOLTAREN) 1 % GEL Apply 4 g topically 4 (four) times daily. (Patient taking  differently: Apply 4 g topically as needed.)   EMGALITY 120 MG/ML SOAJ Inject into the skin as needed.   ezetimibe (ZETIA) 10 MG tablet Take 1 tablet (10 mg total) by mouth daily.   fluocinonide ointment (LIDEX) [id number] % Apply 1 application topically 2 (two) times daily. (Patient taking differently: Apply 1 application  topically as needed.)   fluticasone (FLONASE) 50 MCG/ACT nasal spray Place 2 sprays into both nostrils in the morning and at bedtime.   furosemide (LASIX) 40 MG tablet Take 1 tablet (40 mg total) by mouth daily.   glucose blood (ONETOUCH VERIO) test strip TEST FASTING GLUCOSE DAILY AS DIRECTED   hydrocortisone 1 % ointment Apply 1 application topically 2 (two) times daily. To areas of itching not to use if any blisters or open wounds. USE PRN itching.   isosorbide mononitrate (IMDUR) 30 MG 24 hr tablet Take 1 tablet (30 mg total) by mouth daily.   lamoTRIgine (LAMICTAL) 100 MG tablet Take 100 mg by mouth 2 (two) times daily.   Lancets (ONETOUCH ULTRASOFT) lancets Test fasting each morning and 2 hours before supper. Retest if having hypoglycemic symptoms.   loratadine (CLARITIN) 10 MG tablet Take 1 tablet (10 mg total) by mouth in the morning and at bedtime.   metoprolol tartrate (LOPRESSOR) 100 MG tablet TAKE 1 TABLET BY MOUTH TWICE A DAY   promethazine (PHENERGAN) 25 MG tablet Take 25 mg by mouth every  4 (four) hours as needed.   rosuvastatin (CRESTOR) 20 MG tablet TAKE 1 TABLET BY MOUTH EVERY DAY   Semaglutide,0.25 or 0.5MG/DOS, (OZEMPIC, 0.25 OR 0.5 MG/DOSE,) 2 MG/3ML SOPN Inject 0.25 mg into the skin once a week.   Vilazodone HCl (VIIBRYD) 40 MG TABS TAKE 1 TABLET BY MOUTH EVERY DAY   No facility-administered medications prior to visit.    Review of Systems  All other systems reviewed and are negative. Except see HPI   {Labs  Heme  Chem  Endocrine  Serology  Results Review (optional):23779}   Objective    LMP  (LMP Unknown)  {Show previous vital signs  (optional):23777}  Physical Exam Vitals reviewed.  Constitutional:      General: She is not in acute distress.    Appearance: Normal appearance. She is well-developed. She is obese. She is not diaphoretic.  HENT:     Head: Normocephalic and atraumatic.  Eyes:     General: No scleral icterus.    Conjunctiva/sclera: Conjunctivae normal.  Neck:     Thyroid: No thyromegaly.  Cardiovascular:     Rate and Rhythm: Normal rate and regular rhythm.     Pulses: Normal pulses.          Dorsalis pedis pulses are 2+ on the right side and 2+ on the left side.       Posterior tibial pulses are 2+ on the right side and 2+ on the left side.     Heart sounds: Normal heart sounds. No murmur heard. Pulmonary:     Effort: Pulmonary effort is normal. No respiratory distress.     Breath sounds: Normal breath sounds. No wheezing, rhonchi or rales.  Musculoskeletal:     Cervical back: Neck supple.     Right lower leg: No edema.     Left lower leg: No edema.     Right foot: Normal range of motion. No deformity, bunion, Charcot foot or foot drop.     Left foot: Normal range of motion. No deformity, bunion, Charcot foot or foot drop.  Feet:     Right foot:     Protective Sensation: 8 sites tested.  8 sites sensed.     Skin integrity: Skin breakdown, callus, dry skin and fissure present.     Toenail Condition: Right toenails are long.     Left foot:     Protective Sensation: 8 sites tested.  8 sites sensed.     Skin integrity: Skin breakdown, callus and dry skin present.     Toenail Condition: Left toenails are long.  Lymphadenopathy:     Cervical: No cervical adenopathy.  Skin:    General: Skin is warm and dry.     Findings: No rash.  Neurological:     Mental Status: She is alert and oriented to person, place, and time. Mental status is at baseline.  Psychiatric:        Behavior: Behavior normal.        Thought Content: Thought content normal.        Judgment: Judgment normal.      No results  found for any visits on 06/15/22.  Assessment & Plan     1. Type 2 diabetes mellitus without complication, without long-term current use of insulin (HCC)    Chronic,  controlled Her BS at home 110 Continue to recommend balanced, lower carb meals. Smaller meal size, adding snacks. Choosing water as drink of choice and increasing purposeful exercise. Continue Ozempic 0.47m weekly, controlled by Nephrology  Needs updated labs: - CBC with Differential/Platelet - Comprehensive metabolic panel - TSH Will Fu in 3 mo  2. Stage 4 chronic kidney disease (HCC) Chronic and stable? Continue follow-up with nephrology Continue to make all efforts in drinking plenty of fluids Current GFR 26 - CBC with Differential/Platelet - Comprehensive metabolic panel - TSH Will FU  3. Lower extremity edema Chronic and improving - CBC with Differential/Platelet - Comprehensive metabolic panel - TSH Continue current diuretic Continue follow-up with cardiology Will reassess in 3 mo 4. Constipation, unspecified constipation type Constipation, unspecified constipation type Chronic Pt was advised: -Eliminate medications that cause constipation. -Increase fluid intake. -Increase soluble fiber (25-30g/day) in diet. -Encourage regular defecation attempts after eating. -Regular exercise -Enemas if other methods fail- Bulking agents (accompanied by adequate fluids)  Psyllium  (Konsyl, Metamucil, Perdiem Fiber): 1 tbsp (approx 3.5 grams fiber) in 8-oz liquid PO daily up to TID Pt instructions/high-fiber eating plan were provided Will reassess in 3 mo  5. Pruritus X 2 months Unclear etiology could be due to CKD Needs updated labs Advised to proceed with topical moisturizers and OTC Claritin. Treatment options were restricted by severity of her kidney disease  6. Corn and callus Bilateral, unclear duration Pt has DMII, CKD stage IV, was referred to podiatry for evaluation and management. Will  FU  FU in 3 mo  The patient was advised to call back or seek an in-person evaluation if the symptoms worsen or if the condition fails to improve as anticipated.  I discussed the assessment and treatment plan with the patient. The patient was provided an opportunity to ask questions and all were answered. The patient agreed with the plan and demonstrated an understanding of the instructions.  I, [name], PA-C have reviewed all documentation for this visit. The documentation on 06/15/22 for the exam, diagnosis, procedures, and orders are all accurate and complete.  [name], [facility], [place] [phone] (phone) [phone] (fax)

## 2022-06-16 ENCOUNTER — Other Ambulatory Visit: Payer: Self-pay | Admitting: Allergy & Immunology

## 2022-06-16 DIAGNOSIS — G43719 Chronic migraine without aura, intractable, without status migrainosus: Secondary | ICD-10-CM | POA: Diagnosis not present

## 2022-06-16 DIAGNOSIS — M791 Myalgia, unspecified site: Secondary | ICD-10-CM | POA: Diagnosis not present

## 2022-06-16 DIAGNOSIS — M542 Cervicalgia: Secondary | ICD-10-CM | POA: Diagnosis not present

## 2022-06-16 DIAGNOSIS — G518 Other disorders of facial nerve: Secondary | ICD-10-CM | POA: Diagnosis not present

## 2022-06-16 LAB — CBC WITH DIFFERENTIAL/PLATELET
Basophils Absolute: 0.1 10*3/uL (ref 0.0–0.2)
Basos: 1 %
EOS (ABSOLUTE): 0.3 10*3/uL (ref 0.0–0.4)
Eos: 4 %
Hematocrit: 29.1 % — ABNORMAL LOW (ref 34.0–46.6)
Hemoglobin: 9.4 g/dL — ABNORMAL LOW (ref 11.1–15.9)
Immature Grans (Abs): 0 10*3/uL (ref 0.0–0.1)
Immature Granulocytes: 0 %
Lymphocytes Absolute: 2.5 10*3/uL (ref 0.7–3.1)
Lymphs: 43 %
MCH: 27.7 pg (ref 26.6–33.0)
MCHC: 32.3 g/dL (ref 31.5–35.7)
MCV: 86 fL (ref 79–97)
Monocytes Absolute: 0.4 10*3/uL (ref 0.1–0.9)
Monocytes: 7 %
Neutrophils Absolute: 2.6 10*3/uL (ref 1.4–7.0)
Neutrophils: 45 %
Platelets: 255 10*3/uL (ref 150–450)
RBC: 3.39 x10E6/uL — ABNORMAL LOW (ref 3.77–5.28)
RDW: 13.8 % (ref 11.7–15.4)
WBC: 5.8 10*3/uL (ref 3.4–10.8)

## 2022-06-16 LAB — COMPREHENSIVE METABOLIC PANEL
ALT: 34 IU/L — ABNORMAL HIGH (ref 0–32)
AST: 29 IU/L (ref 0–40)
Albumin/Globulin Ratio: 1.5 (ref 1.2–2.2)
Albumin: 4.5 g/dL (ref 3.9–4.9)
Alkaline Phosphatase: 92 IU/L (ref 44–121)
BUN/Creatinine Ratio: 11 — ABNORMAL LOW (ref 12–28)
BUN: 27 mg/dL (ref 8–27)
Bilirubin Total: <0.2 mg/dL (ref 0.0–1.2)
CO2: 22 mmol/L (ref 20–29)
Calcium: 10.4 mg/dL — ABNORMAL HIGH (ref 8.7–10.3)
Chloride: 105 mmol/L (ref 96–106)
Creatinine, Ser: 2.4 mg/dL — ABNORMAL HIGH (ref 0.57–1.00)
Globulin, Total: 3 g/dL (ref 1.5–4.5)
Glucose: 122 mg/dL — ABNORMAL HIGH (ref 70–99)
Potassium: 4.4 mmol/L (ref 3.5–5.2)
Sodium: 143 mmol/L (ref 134–144)
Total Protein: 7.5 g/dL (ref 6.0–8.5)
eGFR: 22 mL/min/{1.73_m2} — ABNORMAL LOW (ref >59)

## 2022-06-16 LAB — TSH: TSH: 2.07 u[IU]/mL (ref 0.450–4.500)

## 2022-06-16 NOTE — Telephone Encounter (Signed)
Returned a call to the patient . She states that she is in need of a new mask. She also needs a new DME company.she was a previous client of [facility]. Offered patient to come by and get a sample mask until her service gets transferred. She came by and was given a sample of Airtouch F- 20 size medium as well as a F30i standard mask pack. Records and order was sent to [facility] in [place] per patient's request. Faxed to [phone].

## 2022-06-16 NOTE — Progress Notes (Signed)
Please, let pt know that her lab results are stable for you including GFR of 22 and decrease hemoglobin. Please, continue to make efforts to hydrate up daily. If you have any questions, please, contact me  Kind regards, [name], [facility], [place] [phone])

## 2022-06-17 ENCOUNTER — Other Ambulatory Visit: Payer: Self-pay | Admitting: *Deleted

## 2022-06-17 DIAGNOSIS — L299 Pruritus, unspecified: Secondary | ICD-10-CM

## 2022-06-17 NOTE — Telephone Encounter (Signed)
Requested medications are due for refill today.  Provider to determine  Requested medications are on the active medications list.  yes  Last refill. 01/16/2021 30g 2 rf  Future visit scheduled.   yes  Notes to clinic.  Medication not assigned to a protocol. Please review for refill.    Requested Prescriptions  Pending Prescriptions Disp Refills   hydrocortisone 1 % ointment 30 g 2    Sig: Apply 1 Application topically 2 (two) times daily. To areas of itching not to use if any blisters or open wounds. USE PRN itching.     Off-Protocol Failed - 06/17/2022 10:11 AM      Failed - Medication not assigned to a protocol, review manually.      Passed - Valid encounter within last 12 months    Recent Outpatient Visits           2 days ago Type 2 diabetes mellitus without complication, without long-term current use of insulin ([place])   [place], [place], PA-C   2 months ago Abnormal renal function   [place], [place], PA-C   3 months ago Type 2 diabetes mellitus without complication, without long-term current use of insulin ([place])   [place], [place], PA-C   6 months ago Type 2 diabetes mellitus without complication, without long-term current use of insulin ([place])   [place], [place], PA-C   7 months ago Type 2 diabetes mellitus without complication, without long-term current use of insulin ([place])   [place], [place], PA-C       Future Appointments             In 2 months [place], Tami Lin, [place] at [facility]   In 2 months [name], [place], [place]   In 5 months Ernst Bowler, Gwenith Daily, MD [place] at [facility]

## 2022-06-18 ENCOUNTER — Ambulatory Visit (INDEPENDENT_AMBULATORY_CARE_PROVIDER_SITE_OTHER): Payer: Medicare Other | Admitting: Clinical

## 2022-06-18 ENCOUNTER — Other Ambulatory Visit (HOSPITAL_COMMUNITY): Payer: Self-pay

## 2022-06-18 ENCOUNTER — Other Ambulatory Visit: Payer: Self-pay | Admitting: Physician Assistant

## 2022-06-18 ENCOUNTER — Telehealth: Payer: Self-pay | Admitting: Physician Assistant

## 2022-06-18 DIAGNOSIS — F411 Generalized anxiety disorder: Secondary | ICD-10-CM | POA: Diagnosis not present

## 2022-06-18 DIAGNOSIS — F331 Major depressive disorder, recurrent, moderate: Secondary | ICD-10-CM

## 2022-06-18 DIAGNOSIS — F330 Major depressive disorder, recurrent, mild: Secondary | ICD-10-CM | POA: Diagnosis not present

## 2022-06-18 MED ORDER — VILAZODONE HCL 40 MG PO TABS: 40.0000 mg | ORAL_TABLET | Freq: Every day | ORAL | 0 refills | Status: DC

## 2022-06-18 NOTE — Telephone Encounter (Signed)
Patients husband called in says patient is out of this med and needs asap. I let hi know of 48-72hr turnaround

## 2022-06-18 NOTE — Telephone Encounter (Signed)
Medication Refill - Medication: Vilazodone HCl (VIIBRYD) 40 MG TABS   Has the patient contacted their pharmacy? Yes.   Pt told to contact provider  Preferred Pharmacy (with phone number or street name):  [id number] [address] [place] [phone] Fax: [phone]    Has the patient been seen for an appointment in the last year OR does the patient have an upcoming appointment? Yes.    Agent: Please be advised that RX refills may take up to 3 business days. We ask that you follow-up with your pharmacy.

## 2022-06-18 NOTE — Progress Notes (Signed)
                Yoskar Murrillo, LCSW 

## 2022-06-18 NOTE — Telephone Encounter (Signed)
Claritin (loratadine) is not covered under Part D law due to it being OTC. Patient may pay out of pocket. Price at [facility] for 60 tablets per 30 days $14.41- 180 tablets for 90 days is $23.21

## 2022-06-18 NOTE — Telephone Encounter (Signed)
Requested Prescriptions  Pending Prescriptions Disp Refills   Vilazodone HCl (VIIBRYD) 40 MG TABS 90 tablet 0    Sig: Take 1 tablet (40 mg total) by mouth daily.     Psychiatry:  Antidepressants - SSRI Passed - 06/18/2022 11:30 AM      Passed - Completed PHQ-2 or PHQ-9 in the last 360 days      Passed - Valid encounter within last 6 months    Recent Outpatient Visits           3 days ago Type 2 diabetes mellitus without complication, without long-term current use of insulin ([place])   [place], [place], PA-C   2 months ago Abnormal renal function   [place], [place], PA-C   4 months ago Type 2 diabetes mellitus without complication, without long-term current use of insulin ([place])   [place], [place], PA-C   6 months ago Type 2 diabetes mellitus without complication, without long-term current use of insulin ([place])   [place], [place], PA-C   7 months ago Type 2 diabetes mellitus without complication, without long-term current use of insulin ([place])   [place], [place], PA-C       Future Appointments             In 2 months [place], Tami Lin, [place] at [facility]   In 2 months [name], [place], [place]   In 5 months Ernst Bowler, Gwenith Daily, MD [place] at [facility]

## 2022-06-18 NOTE — Telephone Encounter (Signed)
[facility] pharmacy requesting refill Vilazodone HCl (VIIBRYD) 40 MG TABS  Please advise

## 2022-06-18 NOTE — Progress Notes (Signed)
[facility] Counselor/Therapist Progress Note  Patient ID: Abigail Figueroa, MRN: [id number],    Date: 06/18/2022  Time Spent: 12:30pm - 1:24pm : 54 minutes  Treatment Type: Individual Therapy  Reported Symptoms: Patient reported crying today.   Mental Status Exam: Appearance:  Well Groomed     Behavior: Appropriate  Motor: Normal  Speech/Language:  Clear and Coherent  Affect: Appropriate  Mood: normal   Thought process: normal  Thought content:   WNL  Sensory/Perceptual disturbances:   WNL  Orientation: oriented to person, place, and situation  Attention: Good  Concentration: Good  Memory: WNL  Fund of knowledge:  Good  Insight:   Good  Judgment:  Good  Impulse Control: Good   Risk Assessment: Danger to Self:  No Patient denied current suicidal ideation Self-injurious Behavior: No Danger to Others: No Patient denied current homicidal ideation Duty to Warn:no Physical Aggression / Violence:No  Access to Firearms a concern: No  Gang Involvement:No   Subjective: Patient stated, "its been up and down" since last session. Patient stated, "today has been a rough day for me". Patient reported she can not obtain a refill on her prescription for viibryd. Patient reported she has called her provider and the pharmacy regarding her prescription. Patient reported the pharmacy didn't notify patient that her prescription would not automatically be refilled as it has in the past. Patient reported she has been without medication for 2 days. Patient inquired about seeing a psychiatrist and reported she is open to a referral to a psychiatrist. Patient stated, "today hasn't been a good day for me" and reported crying today. Patient reported she vocalized to her husband how she felt regarding husband calling her names during conversations and reported husband has not called her names recently. Patient stated, "I think it has changed" in response to the impact of changes in  patient's and husband's communication. Patient reported feeling she has changed in response to progress towards goals and stated, "I feel better". Patient stated, "I'm ok" in response to current mood.   Interventions: Cognitive Behavioral Therapy. Clinician conducted session via WebEx video from clinician's home office. Patient provided verbal consent to proceed with telehealth session and participated in session from patient's home. Reviewed events since last session. Provided supportive therapy, active and reflective listening, and validation as patient discussed recent issue with her medication and patient's response to the issue. Discussed referral to a psychiatrist.  Assessed changes in patient's communication with her husband and husband's response. Discussed healthy communication strategies patient has implemented and the outcome of those strategies. Reviewed patient's progress in treatment.     Diagnosis:  Mild episode of recurrent major depressive disorder (HCC)   Generalized anxiety disorder   Plan: Patient is to utilize [employer] [facility], thought re-framing, relaxation techniques, mindfulness, effective communication, and coping strategies to decrease symptoms associated with Major Depressive Disorder and Generalized Anxiety Disorder. Frequency: bi-weekly  Modality: individual      Long-term goal:   Patient stated, "I want to see a change in the anxiety".      Reduce overall level, frequency, and intensity of the symptoms of anxiety and depression as evidenced by decreased sadness, feelings of anxiety, feeling "lost", lack of energy, difficulty falling and staying asleep, changes in concentration, psychomotor retardation, rapid heart rate, excessive worry, restlessness, feeling jittery, and feeling on edge from 5 to 6 days per week to 0 to 1 days per week per patient reported for at least 3 consecutive months.    Target  Date: 02/06/23  Progress: progressing    Short-term  goal:  Develop coping strategies to utilize in response to symptoms of depression/anxiety and stressors   Target Date: 08/07/22  Progress: progressing    Verbally express patient's thoughts and feelings to others and utilize effective communication strategies when expressing patient's thoughts/feelings   Target Date: 08/07/22  Progress: progressing    Verbalize an understanding of the relationship between symptoms of anxiety and the impact on patient's thought patterns and behaviors   Target Date: 08/07/22  Progress: progressing    Katherina Right, LCSW

## 2022-06-21 ENCOUNTER — Other Ambulatory Visit: Payer: Self-pay

## 2022-06-21 ENCOUNTER — Telehealth: Payer: Self-pay | Admitting: Cardiovascular Disease

## 2022-06-21 DIAGNOSIS — F331 Major depressive disorder, recurrent, moderate: Secondary | ICD-10-CM

## 2022-06-21 MED ORDER — HYDROCORTISONE 1 % EX OINT: 1.0000 | TOPICAL_OINTMENT | Freq: Two times a day (BID) | CUTANEOUS | 2 refills | Status: DC

## 2022-06-21 NOTE — Telephone Encounter (Signed)
Spoke to patient. Reports when she started Imdur initially it did not bother her but about 1 month later,she started having headaches. She has migraine problem and she is on bunch of medications for that. Reports the headache from Imdur is different than her migraine headaches. She started taking 1/2 tab of Imdur from last 4 weeks still her headaches does not improved   Imdur most common side effect is headache due to its vasodilatory effect. Patient is already on max dose betablocker. Other alternative is Ranolazine but it's use is contraindicated with carbamazepine due to severe drug drug interaction. So ranolazine would not be appropriate for her.  Advised to continue taking Imdur 30 mg 1/2 tab for now until she sees PA in April. And follow some non-pharmacological interventions to manage headaches (relaxation techniques, mindfulness-based therapy)

## 2022-06-21 NOTE — Telephone Encounter (Signed)
Pt c/o medication issue:  1. Name of Medication:   isosorbide mononitrate (IMDUR) 30 MG 24 hr tablet   2. How are you currently taking this medication (dosage and times per day)?   As prescribed  3. Are you having a reaction (difficulty breathing--STAT)?   No  4. What is your medication issue?    Patient stated she is still having really bad headaches with this medication and would like to get alternative medication.

## 2022-06-21 NOTE — Telephone Encounter (Addendum)
Called pt, she states she is still having headaches and wants to know if there is an alternative.  Pt states she also sees [employer]. Will send message for review.

## 2022-06-22 ENCOUNTER — Other Ambulatory Visit: Payer: Self-pay | Admitting: Physician Assistant

## 2022-06-22 ENCOUNTER — Other Ambulatory Visit: Payer: Self-pay | Admitting: Cardiovascular Disease

## 2022-06-22 DIAGNOSIS — F331 Major depressive disorder, recurrent, moderate: Secondary | ICD-10-CM

## 2022-06-22 DIAGNOSIS — E119 Type 2 diabetes mellitus without complications: Secondary | ICD-10-CM

## 2022-06-23 NOTE — Telephone Encounter (Signed)
Rx- rosuvastatin- 02/04/22 #90 1RF- too soon       Vilazodone- 06/18/22 #90- too soon Requested Prescriptions  Pending Prescriptions Disp Refills   Vilazodone HCl (VIIBRYD) 40 MG TABS [Pharmacy Med Name: VILAZODONE HCL 40 MG TABLET] 86 tablet 1    Sig: TAKE 1 TABLET BY [place]     Psychiatry:  Antidepressants - SSRI Passed - 06/22/2022  9:12 AM      Passed - Completed PHQ-2 or PHQ-9 in the last 360 days      Passed - Valid encounter within last 6 months    Recent Outpatient Visits           1 week ago Type 2 diabetes mellitus without complication, without long-term current use of insulin ([place])   [place] Kilmichael, [place], PA-C   2 months ago Abnormal renal function   [place], [place], PA-C   4 months ago Type 2 diabetes mellitus without complication, without long-term current use of insulin ([place])   [place], [place], PA-C   6 months ago Type 2 diabetes mellitus without complication, without long-term current use of insulin ([place])   [place], [place], PA-C   7 months ago Type 2 diabetes mellitus without complication, without long-term current use of insulin ([place])   [place], [place], PA-C       Future Appointments             In 2 months [place], Tami Lin, [place] at [facility]   In 2 months [place], [place], [place], [place]   In 5 months Ernst Bowler, Gwenith Daily, MD [place] [facility] [place] at [facility]             rosuvastatin (CRESTOR) 20 MG tablet [Pharmacy Med Name: ROSUVASTATIN CALCIUM 20 MG TAB] 90 tablet 1    Sig: TAKE 1 TABLET BY MOUTH EVERY DAY     Cardiovascular:  Antilipid - Statins 2 Failed - 06/22/2022  9:12 AM      Failed - Cr in normal range and within 360 days    Creat  Date Value Ref Range Status  02/28/2017  1.03 0.50 - 1.05 mg/dL Final    Comment:    For patients >52 years of age, the reference limit for Creatinine is approximately 13% higher for people identified as African-American. .    Creatinine, Ser  Date Value Ref Range Status  06/15/2022 2.40 (H) 0.57 - 1.00 mg/dL Final   Creatinine, POC  Date Value Ref Range Status  07/20/2016 NA mg/dL Final         Failed - Lipid Panel in normal range within the last 12 months    Cholesterol, Total  Date Value Ref Range Status  11/05/2021 132 100 - 199 mg/dL Final   Cholesterol  Date Value Ref Range Status  08/16/2014 277 (H) mg/dL Final    Comment:    0-200 NOTE: New Reference Range  07/09/14    Ldl Cholesterol, Calc  Date Value Ref Range Status  08/16/2014 167 (H) mg/dL Final    Comment:    0-99 NOTE: New Reference Range:  07/09/14    LDL Cholesterol (Calc)  Date Value Ref Range Status  02/28/2017 190 (H) mg/dL (calc) Final    Comment:    LDL-C levels > or = 190 mg/dL may indicate familial  hypercholesterolemia (FH).  Clinical assessment and  measurement of blood lipid levels should be  considered for all first degree relatives of  patients with an FH diagnosis.  For questions about testing for familial hypercholesterolemia, please call Insurance risk surveyor at [email or web address]. Duncan Dull, et al. J National Lipid Association  Recommendations for Patient-Centered Management of  Dyslipidemia: Part 1 Journal of Clinical Lipidology  2015;9(2), 129-169. Reference range: <100 . Desirable range <100 mg/dL for primary prevention;   <70 mg/dL for patients with CHD or diabetic patients  with > or = 2 CHD risk factors. Marland Kitchen LDL-C is now calculated using the Martin-Hopkins  calculation, which is a validated novel method providing  better accuracy than the Friedewald equation in the  estimation of LDL-C.  Cresenciano Genre et al. Annamaria Helling. [id number]): 2061-2068  (http://education.QuestDiagnostics.com/faq/FAQ164)    LDL  Chol Calc (NIH)  Date Value Ref Range Status  11/05/2021 41 0 - 99 mg/dL Final   HDL Cholesterol  Date Value Ref Range Status  08/16/2014 77 mg/dL Final    Comment:    40-1000 NOTE: New Reference Range:  07/09/14    HDL  Date Value Ref Range Status  11/05/2021 73 >39 mg/dL Final   Triglycerides  Date Value Ref Range Status  11/05/2021 100 0 - 149 mg/dL Final  08/16/2014 167 (H) mg/dL Final    Comment:    0-149 NOTE: New Reference Range  07/09/14          Passed - Patient is not pregnant      Passed - Valid encounter within last 12 months    Recent Outpatient Visits           1 week ago Type 2 diabetes mellitus without complication, without long-term current use of insulin ([place])   [place] Busby, [place], PA-C   2 months ago Abnormal renal function   [place], [place], PA-C   4 months ago Type 2 diabetes mellitus without complication, without long-term current use of insulin ([place])   [place], [place], PA-C   6 months ago Type 2 diabetes mellitus without complication, without long-term current use of insulin ([place])   [place], [place], PA-C   7 months ago Type 2 diabetes mellitus without complication, without long-term current use of insulin ([place])   [place], [place], PA-C       Future Appointments             In 2 months [place], Tami Lin, [place] at [facility]   In 2 months [name], [place], [place]   In 5 months Ernst Bowler, Gwenith Daily, MD [place] [facility] [place] at [facility]

## 2022-06-23 NOTE — Telephone Encounter (Signed)
Requested medication (s) are due for refill today - expired Rx  Requested medication (s) are on the active medication list -yes  Future visit scheduled -yes  Last refill: 05/08/21 #100 3RF  Notes to clinic: expired Rx  Requested Prescriptions  Pending Prescriptions Disp Refills   ONETOUCH VERIO test strip [Pharmacy Med Name: [place] TEST STRIP] 100 strip 3    Sig: TEST FASTING GLUCOSE DAILY AS DIRECTED     Endocrinology: Diabetes - Testing Supplies Passed - 06/22/2022  9:12 AM      Passed - Valid encounter within last 12 months    Recent Outpatient Visits           1 week ago Type 2 diabetes mellitus without complication, without long-term current use of insulin ([place])   [place] Peck, [place], PA-C   2 months ago Abnormal renal function   [place], [place], PA-C   4 months ago Type 2 diabetes mellitus without complication, without long-term current use of insulin ([place])   [place], [place], PA-C   6 months ago Type 2 diabetes mellitus without complication, without long-term current use of insulin ([place])   [place], [place], PA-C   7 months ago Type 2 diabetes mellitus without complication, without long-term current use of insulin ([place])   [place], [place], PA-C       Future Appointments             In 2 months [place], Tami Lin, [place] at [facility]   In 2 months [name], [place], [place]   In 5 months Ernst Bowler, Gwenith Daily, MD [place] at [place] test strip [Pharmacy Med Name: [place] TEST STRIP] 100 strip 3    Sig: TEST FASTING GLUCOSE DAILY AS DIRECTED     Endocrinology: Diabetes - Testing  Supplies Passed - 06/22/2022  9:12 AM      Passed - Valid encounter within last 12 months    Recent Outpatient Visits           1 week ago Type 2 diabetes mellitus without complication, without long-term current use of insulin ([place])   [place] Gage, [place], PA-C   2 months ago Abnormal renal function   [place], [place], PA-C   4 months ago Type 2 diabetes mellitus without complication, without long-term current use of insulin ([place])   [place], [place], PA-C   6 months ago Type 2 diabetes mellitus without complication, without long-term current use of insulin ([place])   [place], [place], PA-C   7 months ago Type 2 diabetes mellitus without complication, without long-term current use of insulin ([place])   [place], [place], PA-C       Future Appointments             In 2 months [place], Tami Lin, [place] at [facility]   In 2 months [name], [place], [place]   In 5 months Ernst Bowler, Gwenith Daily, MD [place] at [facility]

## 2022-06-24 DIAGNOSIS — L299 Pruritus, unspecified: Secondary | ICD-10-CM | POA: Diagnosis not present

## 2022-06-24 DIAGNOSIS — I872 Venous insufficiency (chronic) (peripheral): Secondary | ICD-10-CM | POA: Diagnosis not present

## 2022-06-24 DIAGNOSIS — L709 Acne, unspecified: Secondary | ICD-10-CM | POA: Diagnosis not present

## 2022-06-24 DIAGNOSIS — L219 Seborrheic dermatitis, unspecified: Secondary | ICD-10-CM | POA: Diagnosis not present

## 2022-07-02 ENCOUNTER — Ambulatory Visit (INDEPENDENT_AMBULATORY_CARE_PROVIDER_SITE_OTHER): Payer: Medicare Other | Admitting: Clinical

## 2022-07-02 DIAGNOSIS — F332 Major depressive disorder, recurrent severe without psychotic features: Secondary | ICD-10-CM | POA: Diagnosis not present

## 2022-07-02 DIAGNOSIS — F411 Generalized anxiety disorder: Secondary | ICD-10-CM | POA: Diagnosis not present

## 2022-07-02 NOTE — Progress Notes (Signed)
                Terrell Ostrand, LCSW 

## 2022-07-02 NOTE — Progress Notes (Signed)
[facility] Counselor/Therapist Progress Note  Patient ID: Abigail Figueroa, MRN: [id number],    Date: 07/02/2022  Time Spent: 12:33pm - 1:28pm : 55 minutes   Treatment Type: Individual Therapy  Reported Symptoms: Patient reported recent anger outburst and depressed mood.  Mental Status Exam: Appearance:  Well Groomed     Behavior: Appropriate  Motor: Normal  Speech/Language:  Clear and Coherent  Affect: Tearful  Mood: depressed  Thought process: normal  Thought content:   WNL  Sensory/Perceptual disturbances:   WNL  Orientation: oriented to person, place, and situation  Attention: Good  Concentration: Good  Memory: WNL  Fund of knowledge:  Good  Insight:   Fair  Judgment:  Good  Impulse Control: Good   Risk Assessment: Danger to Self:  No Patient denied current suicidal ideation but stated, "I don't want to kill myself but I don't want to be here no more". Patient denied plan and intent.  Self-injurious Behavior: No Danger to Others: No Patient denied current homicidal ideation  Duty to Warn:no Physical Aggression / Violence:No  Access to Firearms a concern: No  Gang Involvement:No   Subjective: Patient stated, "I sort of lost it" in response to events since last session. Patient reported she was upset by her husband's tone of voice during a recent conversation. Patient stated, "I'm tired, I'm worn down, I can't take it". Patient stated, "I don't want to kill myself but I don't want to be here no more". Patient stated, "I'm tired of the way I live" regarding the state of her home and repairs needed. Patient reported feeling frustrated. Patient stated, "I pray" and reported she thinks about her daughter, her grandchildren, and her dog in response to protective factors. Patient reported she feels she does not have a purpose. Patient stated, "I just want some happiness". Patient reported laughter, reminiscing, spending time with her son and granddaughter, spending  time with grandson, spending time with her daughter, getting her hair and nails done bring patient happiness. Patient reported feeling joy when she sees her grandson. Patient stated, "I feel like I'm on top of the world" in regards to seeing her grandson. Patient reported she is not able to participate in activities outside of the home due to her medical conditions. Patent stated, "yea I do feel better" in response to being out in the community. Patient reported she would be open to volunteer opportunities to increase socialization.   Interventions: Cognitive Behavioral Therapy. Clinician conducted session via WebEx video from clinician's home office. Patient provided verbal consent to proceed with telehealth session and participated in session from patient's home. Reviewed events since last session. Discussed patient's statement, "I sort of lost it" and processed patient's recent conflict with her husband. Explored and identified triggers for recent anger outburst. Assessed current mood. Assessed for suicidal ideation and homicidal ideation. Explored and identified protective factors for patient's safety. Explored patient's thoughts/feelings regarding her purpose. Explored patient's perspective in regards to patient stating she feels she doesn't have a purpose. Challenged thought distortions. Explored and identified contributing factors to patient's happiness. Discussed patient having a conversation with her physician regarding options for patient to increase time in the community. Explored volunteer opportunities to increase socialization. Clinician requested patient maintain gratitude journal and identify at minimum one positive aspect per day.     Diagnosis:  Major Depressive Disorder, recurrent, severe without psychotic features   Generalized anxiety disorder   Plan: Patient is to utilize [facility], thought re-framing, relaxation techniques, mindfulness, effective  communication, and  coping strategies to decrease symptoms associated with Major Depressive Disorder and Generalized Anxiety Disorder. Frequency: bi-weekly  Modality: individual      Long-term goal:   Patient stated, "I want to see a change in the anxiety".      Reduce overall level, frequency, and intensity of the symptoms of anxiety and depression as evidenced by decreased sadness, feelings of anxiety, feeling "lost", lack of energy, difficulty falling and staying asleep, changes in concentration, psychomotor retardation, rapid heart rate, excessive worry, restlessness, feeling jittery, and feeling on edge from 5 to 6 days per week to 0 to 1 days per week per patient reported for at least 3 consecutive months.    Target Date: 02/06/23  Progress: progressing    Short-term goal:  Develop coping strategies to utilize in response to symptoms of depression/anxiety and stressors   Target Date: 08/07/22  Progress: progressing    Verbally express patient's thoughts and feelings to others and utilize effective communication strategies when expressing patient's thoughts/feelings   Target Date: 08/07/22  Progress: progressing    Verbalize an understanding of the relationship between symptoms of anxiety and the impact on patient's thought patterns and behaviors   Target Date: 08/07/22  Progress: progressing     Katherina Right, LCSW

## 2022-07-05 ENCOUNTER — Ambulatory Visit (INDEPENDENT_AMBULATORY_CARE_PROVIDER_SITE_OTHER): Payer: Medicare Other | Admitting: Podiatry

## 2022-07-05 ENCOUNTER — Encounter: Payer: Self-pay | Admitting: Podiatry

## 2022-07-05 VITALS — BP 130/69 | HR 63

## 2022-07-05 DIAGNOSIS — L309 Dermatitis, unspecified: Secondary | ICD-10-CM | POA: Diagnosis not present

## 2022-07-05 DIAGNOSIS — L603 Nail dystrophy: Secondary | ICD-10-CM

## 2022-07-05 DIAGNOSIS — E119 Type 2 diabetes mellitus without complications: Secondary | ICD-10-CM | POA: Diagnosis not present

## 2022-07-05 DIAGNOSIS — Z1211 Encounter for screening for malignant neoplasm of colon: Secondary | ICD-10-CM | POA: Diagnosis not present

## 2022-07-05 DIAGNOSIS — K529 Noninfective gastroenteritis and colitis, unspecified: Secondary | ICD-10-CM | POA: Diagnosis not present

## 2022-07-05 MED ORDER — CLOTRIMAZOLE-BETAMETHASONE 1-0.05 % EX CREA: 1.0000 | TOPICAL_CREAM | Freq: Two times a day (BID) | CUTANEOUS | 3 refills | Status: AC

## 2022-07-05 NOTE — Progress Notes (Signed)
  Subjective:  Patient ID: Abigail Figueroa, female    DOB: [birth year],  MRN: [id number]  Chief Complaint  Patient presents with   Diabetes    "My doctor wanted me to come and see you because I'm Diabetic.  Skin builds up on my heels and my toenails keep  breaking." N - diabetic foot care L - bilateral D - 20 yrs O - gradually worse C - skin builds up on heel, toenails breaking off A - none T - Saw a Dermatologist, try to keep them moisturized.    62 y.o. female presents with the above complaint. History confirmed with patient.  She has type 2 diabetes that is well-controlled.  She notes dry scaling skin on the bottom of both feet.  She tries moisturizing lotion with Aquaphor and Eucerin and this has not helped.  She has a history of dry itchy skin on other parts of the body because of her kidney disease and uses topical steroids for this but has not uses on the foot.  She also notes splitting of the nails  Objective:  Physical Exam: warm, good capillary refill, no trophic changes or ulcerative lesions, normal DP and PT pulses, normal sensory exam, and splitting of some nails, she has melanonychia noted, dry scaling skin on plantar portions of both feet no interdigital maceration mostly focused towards the heel Assessment:   1. Onychoschizia   2. Dermatitis   3. Encounter for diabetic foot exam [facility])      Plan:  Patient was evaluated and treated and all questions answered.  We discussed that this is likely a dermatitis or possible tinea pedis.  I recommended use of Lotrisone cream and Rx for this was sent to pharmacy to use twice daily until resolved then continue use of moisturizing lotions and creams.  Regarding her diabetes her blood sugars well-controlled and she has no acute issues that I expect she will have issues with.  Diabetic foot exam was performed today.  We discussed that splitting of the nails can be random and can be related to nutritional deficiencies such as biotin  which supplementation may help to alleviate this.  She will try this.  Discussed if it becomes painful or causing infections that permanent or temporary matricectomy nail removal and reset this process  Return if symptoms worsen or fail to improve.

## 2022-07-12 DIAGNOSIS — R079 Chest pain, unspecified: Secondary | ICD-10-CM | POA: Diagnosis not present

## 2022-07-12 DIAGNOSIS — R0789 Other chest pain: Secondary | ICD-10-CM | POA: Diagnosis not present

## 2022-07-12 DIAGNOSIS — R0902 Hypoxemia: Secondary | ICD-10-CM | POA: Diagnosis not present

## 2022-07-13 ENCOUNTER — Emergency Department: Payer: Medicare Other

## 2022-07-13 ENCOUNTER — Telehealth: Payer: Self-pay | Admitting: Cardiovascular Disease

## 2022-07-13 ENCOUNTER — Emergency Department
Admission: EM | Admit: 2022-07-13 | Discharge: 2022-07-13 | Disposition: A | Discharge status: Home / Self Care | Payer: Medicare Other | Attending: Emergency Medicine | Admitting: Emergency Medicine

## 2022-07-13 ENCOUNTER — Other Ambulatory Visit: Payer: Self-pay

## 2022-07-13 DIAGNOSIS — N184 Chronic kidney disease, stage 4 (severe): Secondary | ICD-10-CM | POA: Diagnosis not present

## 2022-07-13 DIAGNOSIS — J45909 Unspecified asthma, uncomplicated: Secondary | ICD-10-CM | POA: Diagnosis not present

## 2022-07-13 DIAGNOSIS — E119 Type 2 diabetes mellitus without complications: Secondary | ICD-10-CM | POA: Insufficient documentation

## 2022-07-13 DIAGNOSIS — R0789 Other chest pain: Secondary | ICD-10-CM | POA: Diagnosis not present

## 2022-07-13 DIAGNOSIS — I5030 Unspecified diastolic (congestive) heart failure: Secondary | ICD-10-CM | POA: Insufficient documentation

## 2022-07-13 DIAGNOSIS — R079 Chest pain, unspecified: Secondary | ICD-10-CM | POA: Diagnosis not present

## 2022-07-13 DIAGNOSIS — I130 Hypertensive heart and chronic kidney disease with heart failure and stage 1 through stage 4 chronic kidney disease, or unspecified chronic kidney disease: Secondary | ICD-10-CM | POA: Diagnosis not present

## 2022-07-13 LAB — COMPREHENSIVE METABOLIC PANEL
ALT: 80 U/L — ABNORMAL HIGH (ref 0–44)
AST: 218 U/L — ABNORMAL HIGH (ref 15–41)
Albumin: 3.8 g/dL (ref 3.5–5.0)
Alkaline Phosphatase: 86 U/L (ref 38–126)
Anion gap: 9 (ref 5–15)
BUN: 40 mg/dL — ABNORMAL HIGH (ref 8–23)
CO2: 26 mmol/L (ref 22–32)
Calcium: 9 mg/dL (ref 8.9–10.3)
Chloride: 104 mmol/L (ref 98–111)
Creatinine, Ser: 2.24 mg/dL — ABNORMAL HIGH (ref 0.44–1.00)
GFR, Estimated: 24 mL/min — ABNORMAL LOW (ref >60)
Glucose, Bld: 148 mg/dL — ABNORMAL HIGH (ref 70–99)
Potassium: 3.5 mmol/L (ref 3.5–5.1)
Sodium: 139 mmol/L (ref 135–145)
Total Bilirubin: 0.4 mg/dL (ref 0.3–1.2)
Total Protein: 7.3 g/dL (ref 6.5–8.1)

## 2022-07-13 LAB — CBC WITH DIFFERENTIAL/PLATELET
Abs Immature Granulocytes: 0.01 10*3/uL (ref 0.00–0.07)
Basophils Absolute: 0.1 10*3/uL (ref 0.0–0.1)
Basophils Relative: 1 %
Eosinophils Absolute: 0.3 10*3/uL (ref 0.0–0.5)
Eosinophils Relative: 6 %
HCT: 26.3 % — ABNORMAL LOW (ref 36.0–46.0)
Hemoglobin: 8.2 g/dL — ABNORMAL LOW (ref 12.0–15.0)
Immature Granulocytes: 0 %
Lymphocytes Relative: 44 %
Lymphs Abs: 2.1 10*3/uL (ref 0.7–4.0)
MCH: 27.7 pg (ref 26.0–34.0)
MCHC: 31.2 g/dL (ref 30.0–36.0)
MCV: 88.9 fL (ref 80.0–100.0)
Monocytes Absolute: 0.3 10*3/uL (ref 0.1–1.0)
Monocytes Relative: 6 %
Neutro Abs: 2 10*3/uL (ref 1.7–7.7)
Neutrophils Relative %: 43 %
Platelets: 193 10*3/uL (ref 150–400)
RBC: 2.96 MIL/uL — ABNORMAL LOW (ref 3.87–5.11)
RDW: 13.3 % (ref 11.5–15.5)
WBC: 4.6 10*3/uL (ref 4.0–10.5)
nRBC: 0 % (ref 0.0–0.2)

## 2022-07-13 LAB — URINALYSIS, ROUTINE W REFLEX MICROSCOPIC
Bilirubin Urine: NEGATIVE
Glucose, UA: NEGATIVE mg/dL
Hgb urine dipstick: NEGATIVE
Ketones, ur: NEGATIVE mg/dL
Leukocytes,Ua: NEGATIVE
Nitrite: NEGATIVE
Protein, ur: NEGATIVE mg/dL
Specific Gravity, Urine: 1.014 (ref 1.005–1.030)
pH: 5 (ref 5.0–8.0)

## 2022-07-13 LAB — TROPONIN I (HIGH SENSITIVITY)
Troponin I (High Sensitivity): 4 ng/L (ref <18)
Troponin I (High Sensitivity): 4 ng/L (ref <18)

## 2022-07-13 NOTE — Telephone Encounter (Signed)
New Message:     Patient went to [facility] ER yesterday for chest pains. She said she was told to see someone by 07-14-22. The first available appointment that we have is 08-04-22. Please evaluate patient and see if patient should wait that long.

## 2022-07-13 NOTE — ED Provider Notes (Signed)
Abigail Figueroa Provider Note    Event Date/Time   First MD Initiated Contact with Patient 07/13/22 0315     (approximate)   History   Chest Pain   HPI  Abigail Figueroa is a 62 y.o. female past medical history of anemia, diabetes, hypertension presents with chest pain.  Patient woke from sleep around midnight with a pain in her chest which she describes as aching sensation.  Did not radiate was not associated with nausea shortness of breath or diaphoresis but did feel clammy.  Lasted for about 15 minutes.  Unclear if it was exertional.  Patient now feels back to baseline is not having any ongoing pain.  Denies history of similar.  Patient has history of diastolic heart failure.  Does take diuretic.  Denies any lower extremity swelling.  Has history of anemia denies any blood in her stool or black stool.     Past Medical History:  Diagnosis Date   Abnormal stress test    a. 10/2009: Normal myocardial perfusion imaging; b. 08/2015 MV: medium defect of mod severity in mid ant apical region w/ mild HK of the distal inf wall;  c. 08/2015 Cath: nl Cors, EF 60%.   Anemia    Angio-edema    Anxiety    Arthritis    Bell's palsy    Depression    Dermatitis 09/10/2014   Edema    Essential hypertension    Frequent urination    Frequent urination at night    Headache    migraines, gets botox injections   Heart palpitations 10/2011   Event monitor showing sinus tachycardia, PACs with couplets and triplets.   Hx of echocardiogram    a. 10/2009 Echo: showed normal left ventricular function, mild left ventricular hypertrophy, and no significant valve abnormalities.   Hypercholesterolemia    Migraine    Morbid obesity ([place])    Neuromuscular disorder ([place])    OSA (obstructive sleep apnea)    has been on continuous positive airway pressure   Seizure disorder ([place])    Seizures ([place])    Type II diabetes mellitus ([place])    Urticaria     Patient Active Problem List    Diagnosis Date Noted   CKD (chronic kidney disease) stage 4, GFR 15-29 ml/min (HCC) 06/03/2022   Pruritus 01/16/2021   Seasonal and perennial allergic rhinitis 10/11/2019   Angio-edema 08/30/2019   Allergic reaction 08/30/2019   Morbid obesity due to excess calories ([place]) 02/06/2019   Osteoarthrosis of hand 12/28/2017   Special screening for malignant neoplasms, colon    Rhinitis, allergic 06/18/2017   Right-sided Bell's palsy 02/02/2016   Spondylolisthesis of lumbar region 08/25/2015   Type II diabetes mellitus (HCC)    Abnormal stress test    Hypercholesterolemia    Abnormal nuclear stress test 07/30/2015   Preoperative clearance 07/22/2015   Lower extremity edema 07/22/2015   History of brain disorder 11/05/2014   H/O: obesity 11/05/2014   Depression, major, recurrent, moderate ([place]) 11/05/2014   Difficulty with family 11/05/2014   Feeling stressed out 11/05/2014   H/O: HTN (hypertension) 11/05/2014   H/O elevated lipids 11/05/2014   Anxiety, generalized 11/05/2014   Migraine headache 11/05/2014   Moderate recurrent major depression ([place]) 10/16/2014   Generalized anxiety disorder 10/16/2014   Edema 10/14/2014   Depressive disorder 10/14/2014   Obstructive sleep apnea 10/14/2014   Diabetes ([place]) 10/14/2014   Essential hypertension 10/14/2014   Epilepsy ([place]) 10/14/2014   Migraine 10/14/2014  Hyperlipemia 10/14/2014   Dysthymic disorder 10/14/2014   Seizure ([place]) 10/14/2014   Hypercholesteremia 10/14/2014   Palpitation 10/14/2014   Depression 10/14/2014   Dyspepsia 10/14/2014   Hematochezia 10/14/2014   Urge incontinence 10/14/2014   Hair thinning 10/14/2014   Dermatitis 09/10/2014   Acid indigestion 09/10/2014   Alopecia 09/10/2014   Blood in feces 09/10/2014   H/O hypercholesterolemia 09/10/2014   Arthralgia of hip 09/10/2014   Feeling bilious 09/10/2014   Awareness of heartbeats 09/10/2014   Pain in the wrist 09/10/2014   Convulsions ([place]) 09/10/2014    Urge incontinence 09/10/2014   Unspecified convulsions ([place]) 09/10/2014   Snapping thumb syndrome 04/19/2014   Diabetes mellitus ([place]) 03/14/2014   De Quervain's disease (radial styloid tenosynovitis) 03/08/2014   LBP (low back pain) 03/07/2013   Mixed hyperlipidemia 01/15/2013   Encounter for long-term (current) use of other medications - statin 01/15/2013   Fatigue 01/15/2013   Heart palpation 01/15/2013   Obesity (BMI 30-39.9) 01/02/2013   CCF (congestive cardiac failure) ([place]) 11/13/2009   Congestive heart failure ([place]) 11/13/2009   Malaise and fatigue 02/17/2009   Sprain and strain of interphalangeal (joint) of hand 10/23/2007   HEADACHE 06/23/2007   Diabetes mellitus type 2 in obese ([place]) 05/08/2007   HYPERCALCEMIA 05/08/2007   Sleep apnea 05/08/2007   Edema 05/08/2007   DIARRHEA, CHRONIC 05/08/2007   Clinical depression 10/20/2006   Obstructive apnea 03/22/2006   Obstructive sleep apnea syndrome 03/22/2006   Diabetes mellitus, type 2 ([place]) 11/12/2005   Depression, neurotic 11/12/2005   Epilepsy ([place]) 11/12/2005   Combined fat and carbohydrate induced hyperlipemia 11/12/2005   Acute onset aura migraine 11/12/2005     Physical Exam  Triage Vital Signs: ED Triage Vitals  Enc Vitals Group     BP 07/13/22 0025 130/66     Pulse Rate 07/13/22 0025 67     Resp 07/13/22 0025 18     Temp 07/13/22 0027 98.6 F (37 C)     Temp Source 07/13/22 0027 Oral     SpO2 07/13/22 0025 98 %     Weight --      Height --      Head Circumference --      Peak Flow --      Pain Score --      Pain Loc --      Pain Edu? --      Excl. in [place]? --     Most recent vital signs: Vitals:   07/13/22 0054 07/13/22 0330  BP: 135/74 120/67  Pulse: 88 64  Resp: 18 13  Temp:    SpO2: 100% 100%     General: Awake, no distress.  CV:  Good peripheral perfusion.  1+ pitting edema bilateral lower extremities, symmetric Resp:  Normal effort.  Lungs are clear Abd:  No distention.  Open  cholecystectomy scar, abdomen soft nontender Neuro:             Awake, Alert, Oriented x 3  Other:     ED Results / Procedures / Treatments  Labs (all labs ordered are listed, but only abnormal results are displayed) Labs Reviewed  CBC WITH DIFFERENTIAL/PLATELET - Abnormal; Notable for the following components:      Result Value   RBC 2.96 (*)    Hemoglobin 8.2 (*)    HCT 26.3 (*)    All other components within normal limits  COMPREHENSIVE METABOLIC PANEL - Abnormal; Notable for the following components:   Glucose, Bld 148 (*)  BUN 40 (*)    Creatinine, Ser 2.24 (*)    AST 218 (*)    ALT 80 (*)    GFR, Estimated 24 (*)    All other components within normal limits  URINALYSIS, ROUTINE W REFLEX MICROSCOPIC - Abnormal; Notable for the following components:   Color, Urine STRAW (*)    APPearance CLEAR (*)    All other components within normal limits  TROPONIN I (HIGH SENSITIVITY)  TROPONIN I (HIGH SENSITIVITY)     EKG  EKG reviewed and interpreted by myself shows sinus rhythm with normal axis normal intervals ST depression lead III aVF lead II lead V3 some in V4 V5 and V6   RADIOLOGY I reviewed and interpreted the CXR which does not show any acute cardiopulmonary process    PROCEDURES:  Critical Care performed: No  Procedures The patient is on the cardiac monitor to evaluate for evidence of arrhythmia and/or significant heart rate changes.   MEDICATIONS ORDERED IN ED: Medications - No data to display   IMPRESSION / MDM / [place] / ED COURSE  I reviewed the triage vital signs and the nursing notes.                              Patient's presentation is most consistent with acute presentation with potential threat to life or bodily function.  Differential diagnosis includes, but is not limited to, ACS including NSTEMI, unstable angina, GI related pain, less likely PE or dissection  The patient is a 62 year old female with multiple cardiac risk  factors who presents because of chest pain.  This woke her up from sleep around midnight it was an aching sensation that did not radiate is associated with feeling clammy but otherwise no shortness of breath diaphoresis or nausea.  Lasted for about 15 minutes and has resolved.  Has no ongoing pain and feels improved now.  Has no complaints currently.  Vital signs are reassuring patient overall looks well does have some pitting edema lungs are clear abdominal exam is benign.  Labs are notable for a normocytic anemia hemoglobin 8.2, was 9.4, 4 weeks ago.  Creatinine is at baseline.  Suspect her anemia is in the setting of CKD.  She has no bleeding and it is normocytic.  Patient's initial EKG had significant baseline artifact, on repeat there is some ST depression inferiorly as well as V3 V4 and V5, will repeat.  Repeat EKG does have abnormalities there is ST depression inferiorly 2 3 aVF as well as V4 V5 V6 and V3.  Reviewed multiple prior EKGs, January 2024 voltage is much lower than it is today so it is difficult to interpret but lead V3 ST depression does look similar to the EKG from today.  EKG from 01/07/2022 does look similar to EKG from today, although the depressions in V4 V5 and V6 do look somewhat more pronounced today.  Patient has 2 negative troponins.  Continues to be pain-free in the emergency department.  Given patient's abnormal EKG I did discuss with her the option of admission to the hospital for expedited cardiac workup versus outpatient cardiology follow-up.  She tells me she has a cardiologist in [place] and would like to see her cardiologist.  I did recommend she call the office today to schedule an appointment.  I did discuss with her that her EKG is abnormal and that she will need further workup.  Discussed that if she has any  recurrent episodes that she return to the emergency department.  With her being pain-free and 2 negative troponins it is reasonable to continue workup as an  outpatient.     FINAL CLINICAL IMPRESSION(S) / ED DIAGNOSES   Final diagnoses:  Chest pain, unspecified type     Rx / DC Orders   ED Discharge Orders          Ordered    Ambulatory referral to Cardiology       Comments: If you have not heard from the Cardiology office within the next 72 hours please call [phone].   07/13/22 0509             Note:  This document was prepared using Dragon voice recognition software and may include unintentional dictation errors.   Rada Hay, MD 07/13/22 [phone]

## 2022-07-13 NOTE — ED Triage Notes (Signed)
To triage via [facility] from home with c/o chest pain, woke up from sleeping. Pressure like, non radiating. Also c/o increased fatigue and weakness recently. Denies SOB, fever, or covid/flu sx. Took 4 '81mg'$  ASA pta 18G RAC

## 2022-07-13 NOTE — Discharge Instructions (Signed)
Please call your cardiologist this morning to schedule an appointment.  If you have any recurrent episodes of chest pain please return to emergency department.

## 2022-07-13 NOTE — Telephone Encounter (Signed)
Called patient, they advised that she had EKG changes, she is just requesting to see someone sooner, she has anxiety about all that they told her at the hospital. She was scheduled for first of April. All other testing was normal. Used spot for NP next week to have follow up hospital visit.  Patient verbalized understanding. Thankful for call back.

## 2022-07-14 ENCOUNTER — Ambulatory Visit (INDEPENDENT_AMBULATORY_CARE_PROVIDER_SITE_OTHER): Payer: Medicare Other | Admitting: Physician Assistant

## 2022-07-14 ENCOUNTER — Encounter: Payer: Self-pay | Admitting: Physician Assistant

## 2022-07-14 VITALS — BP 111/70 | HR 74 | Temp 99.5°F | Ht 63.0 in | Wt 191.1 lb

## 2022-07-14 DIAGNOSIS — R0602 Shortness of breath: Secondary | ICD-10-CM | POA: Diagnosis not present

## 2022-07-14 DIAGNOSIS — N184 Chronic kidney disease, stage 4 (severe): Secondary | ICD-10-CM | POA: Diagnosis not present

## 2022-07-14 DIAGNOSIS — R5383 Other fatigue: Secondary | ICD-10-CM

## 2022-07-14 DIAGNOSIS — R0789 Other chest pain: Secondary | ICD-10-CM

## 2022-07-14 DIAGNOSIS — D509 Iron deficiency anemia, unspecified: Secondary | ICD-10-CM | POA: Diagnosis not present

## 2022-07-14 NOTE — Progress Notes (Signed)
I,[name],acting as a Education administrator for [employer], PA-C.,have documented all relevant documentation on the behalf of Abigail Speak, PA-C,as directed by  [employer], PA-C while in the presence of [employer], PA-C.   Established patient visit   Patient: Abigail Figueroa   DOB: 02-04-1961   62 y.o. Female  MRN: [id number] Visit Date: 07/14/2022  Today's healthcare provider: [name], PA-C   CC: Chest pain, fatigue x 2 days. Patient wants to discuss the results for previous lab results Subjective    HPI  Chest Pain: Patient complains of chest pain. Onset was 2 days ago.The patient describes the pain as intermittent, dull, pressure like, and sharp in nature, radiates to the across the chest . Patient rates pain as a 5/10 in intensity.  Associated symptoms are chest pain, chest pressure/discomfort, dyspnea, and fatigue. Fatigue has been worsening since last night when she has been having night sweats, looks pale, chest pain, and feeling overall unwell. Aggravating factors are none.  Alleviating factors are:  none . Patient's cardiac risk factors are diabetes mellitus.  Patients' risk: CKD stage 4 with GFR of 24 . "Creatinine in December 2023 was 2.48 with estimated GFR 22.  Most recent creatinine on May 20, 2022 was 2.10.  She is now seeing Dr. Melynda Keller in [place]." Previous cardiac testing: chest x-ray. Her recent labs showed decrease of hemoglobin level to 8.2 from 11.3 a year ago. Her lipid enzymes were elevated to 218 and 80 . Follow up ER visit  Patient was seen in ER for chest pain on 07/13/2022. She was treated for chest pain. Treatment for this included labs and EKG.EKG abnormal,need further workup. Per patient she has a Film/video editor in [place].Patient has appointment 03/19 and 04/03 She reports excellent compliance with treatment. She reports this condition is  worsened.   -----------------------------------------------------------------------------------------   Medications: Outpatient Medications Prior to Visit  Medication Sig   amLODipine (NORVASC) 5 MG tablet Take 1 tablet (5 mg total) by mouth daily. PLEASE KEEP SCHEDULED APPOINTMENT   Azelastine HCl 0.15 % SOLN PLACE 2 SPRAYS INTO BOTH NOSTRILS 2 (TWO) TIMES DAILY   BOTOX 100 UNITS SOLR injection Inject into the muscle every 3 (three) months.    busPIRone (BUSPAR) 15 MG tablet TAKE 1 TABLET BY MOUTH TWICE A DAY   carbamazepine (TEGRETOL-XR) 400 MG 12 hr tablet TAKE 1 TABLET BY MOUTH TWICE A DAY   chlorproMAZINE (THORAZINE) 25 MG tablet Take 50 mg by mouth 2 (two) times daily as needed.   chlorzoxazone (PARAFON) 500 MG tablet Take by mouth 4 (four) times daily as needed for muscle spasms.   clotrimazole-betamethasone (LOTRISONE) cream Apply 1 Application topically 2 (two) times daily.   diclofenac Sodium (VOLTAREN) 1 % GEL Apply 4 g topically 4 (four) times daily. (Patient taking differently: Apply 4 g topically as needed.)   EMGALITY 120 MG/ML SOAJ Inject into the skin as needed.   EPINEPHrine 0.3 mg/0.3 mL IJ SOAJ injection Inject into the muscle.   ezetimibe (ZETIA) 10 MG tablet Take 1 tablet (10 mg total) by mouth daily.   fluocinolone (SYNALAR) 0.01 % external solution Apply topically 2 (two) times daily.   fluocinonide ointment (LIDEX) [id number] % Apply 1 application topically 2 (two) times daily. (Patient taking differently: Apply 1 application  topically as needed.)   fluticasone (FLONASE) 50 MCG/ACT nasal spray Place 2 sprays into both nostrils in the morning and at bedtime.   furosemide (LASIX) 40 MG tablet Take 1 tablet (40  mg total) by mouth daily.   hydrocortisone 1 % ointment Apply 1 Application topically 2 (two) times daily. To areas of itching not to use if any blisters or open wounds. USE PRN itching.   isosorbide mononitrate (IMDUR) 30 MG 24 hr tablet Take 1 tablet (30 mg total)  by mouth daily. (Patient taking differently: Take 15 mg by mouth daily.)   lamoTRIgine (LAMICTAL) 100 MG tablet Take 100 mg by mouth 2 (two) times daily.   Lancets (ONETOUCH ULTRASOFT) lancets Test fasting each morning and 2 hours before supper. Retest if having hypoglycemic symptoms. (Patient taking differently: 1 each by Other route as needed for other. Test fasting each morning and 2 hours before supper. Retest if having hypoglycemic symptoms.)   metoprolol tartrate (LOPRESSOR) 100 MG tablet TAKE 1 TABLET BY MOUTH TWICE A DAY   ONETOUCH VERIO test strip TEST FASTING GLUCOSE DAILY AS DIRECTED   promethazine (PHENERGAN) 25 MG tablet Take 25 mg by mouth every 4 (four) hours as needed.   rosuvastatin (CRESTOR) 20 MG tablet TAKE 1 TABLET BY MOUTH EVERY DAY   Semaglutide,0.25 or 0.'5MG'$ /DOS, (OZEMPIC, 0.25 OR 0.5 MG/DOSE,) 2 MG/3ML SOPN Inject 0.25 mg into the skin once a week.   tretinoin (RETIN-A) 0.025 % cream Apply topically at bedtime.   triamcinolone ointment (KENALOG) 0.1 % Apply 1 Application topically 2 (two) times daily.   Vilazodone HCl (VIIBRYD) 40 MG TABS Take 1 tablet (40 mg total) by mouth daily.   clindamycin (CLEOCIN T) 1 % lotion APPLY TOPICALLY ONCE A DAY TO AREAS OF ACNE ON THE FACE (Patient not taking: Reported on 07/05/2022)   loratadine (CLARITIN) 10 MG tablet Take 1 tablet (10 mg total) by mouth in the morning and at bedtime.   No facility-administered medications prior to visit.    Review of Systems  All other systems reviewed and are negative. Except see HPI      Objective    BP 111/70 (BP Location: Right Arm, Patient Position: Sitting, Cuff Size: Normal)   Pulse 74   Temp 99.5 F (37.5 C) (Oral)   Ht '5\' 3"'$  (1.6 m)   Wt 191 lb 1.6 oz (86.7 kg)   LMP  (LMP Unknown)   SpO2 98%   BMI 33.85 kg/m    Physical Exam Vitals reviewed.  Constitutional:      General: She is not in acute distress.    Appearance: Normal appearance. She is well-developed. She is not  diaphoretic.  HENT:     Head: Normocephalic and atraumatic.  Eyes:     General: No scleral icterus.    Conjunctiva/sclera: Conjunctivae normal.  Neck:     Thyroid: No thyromegaly.  Cardiovascular:     Rate and Rhythm: Normal rate and regular rhythm.     Pulses: Normal pulses.     Heart sounds: Normal heart sounds. No murmur heard. Pulmonary:     Effort: Pulmonary effort is normal. No respiratory distress.     Breath sounds: Normal breath sounds. No wheezing, rhonchi or rales.  Musculoskeletal:     Cervical back: Neck supple.     Right lower leg: No edema.     Left lower leg: No edema.  Lymphadenopathy:     Cervical: No cervical adenopathy.  Skin:    General: Skin is warm.     Findings: No rash.  Neurological:     Mental Status: She is alert and oriented to person, place, and time. Mental status is at baseline.  Psychiatric:  Behavior: Behavior normal.        Thought Content: Thought content normal.        Judgment: Judgment normal.    Pt has been dozing , seems drowsy and could not sit straight on the exam table Has been having trouble to answer questions and concentrate.  No results found for any visits on 07/14/22.  Assessment & Plan     1. Other fatigue, persistent 2. Microcytic anemia/hgb 8.2 from 11.7 1 year ago and 9.4 4 weeks ago. 3. Chest pain 4. Shortness of breath Acute problem Vitals normal In the setting of CKD stage IV/GFR of 24, DMII on ozempic, HLD /on rosuvastatin '20mg'$  and zetia '10mg'$ , obesity, hx of CHF, HTN/on 4 medications including amlodipine 7.'5mg'$  daily, furosemide '40mg'$ d, metoprolol '100mg'$  BID, isosorbide '30mg'$  daily, epilepsia, OSA/on CPAP machine, pruritus Sudden elevation of AST, chest pain Symptomatic with SOB EKG from ED was abnormal per ED note but troponin I levels were WNL, see note from 3.12.24 CXR showed no acute abnormalities Colonoscopy from 5 years ago showed polyp and was recommended to repeat colonoscopy in 5 years. Pt was sent  to ER for an evaluation and management Per chart review, EKG was abnormal at [facility] and an option of admission to the hospital for expedited cardiac workup was given during the ED visit.  No follow-ups on file.     The patient was advised to call back or seek an in-person evaluation if the symptoms worsen or if the condition fails to improve as anticipated.  I discussed the assessment and treatment plan with the patient. The patient was provided an opportunity to ask questions and all were answered. The patient agreed with the plan and demonstrated an understanding of the instructions.  I, Abigail Speak, PA-C have reviewed all documentation for this visit. The documentation on  07/14/22  for the exam, diagnosis, procedures, and orders are all accurate and complete.  Abigail Figueroa, [facility], [place] [phone] (phone) [phone] (fax)   [place]

## 2022-07-15 ENCOUNTER — Ambulatory Visit (INDEPENDENT_AMBULATORY_CARE_PROVIDER_SITE_OTHER): Payer: Medicare Other | Admitting: Family

## 2022-07-15 ENCOUNTER — Encounter (HOSPITAL_BASED_OUTPATIENT_CLINIC_OR_DEPARTMENT_OTHER): Payer: Self-pay | Admitting: Family

## 2022-07-15 ENCOUNTER — Encounter (HOSPITAL_BASED_OUTPATIENT_CLINIC_OR_DEPARTMENT_OTHER): Payer: Self-pay | Admitting: Cardiology

## 2022-07-15 ENCOUNTER — Telehealth: Payer: Self-pay | Admitting: Cardiovascular Disease

## 2022-07-15 VITALS — BP 137/76 | HR 73 | Ht 63.0 in | Wt 190.0 lb

## 2022-07-15 DIAGNOSIS — R748 Abnormal levels of other serum enzymes: Secondary | ICD-10-CM

## 2022-07-15 DIAGNOSIS — G4733 Obstructive sleep apnea (adult) (pediatric): Secondary | ICD-10-CM

## 2022-07-15 DIAGNOSIS — I10 Essential (primary) hypertension: Secondary | ICD-10-CM | POA: Diagnosis not present

## 2022-07-15 DIAGNOSIS — R079 Chest pain, unspecified: Secondary | ICD-10-CM | POA: Diagnosis not present

## 2022-07-15 DIAGNOSIS — N184 Chronic kidney disease, stage 4 (severe): Secondary | ICD-10-CM

## 2022-07-15 DIAGNOSIS — E782 Mixed hyperlipidemia: Secondary | ICD-10-CM

## 2022-07-15 NOTE — Patient Instructions (Signed)
Medication Instructions:  Your physician has recommended you make the following change in your medication:   Stop: Imdur  *If you need a refill on your cardiac medications before your next appointment, please call your pharmacy*   Lab Work: [facility] will ask hematology to check your liver Enzymes   Testing/Procedures: Your Physician has requested you have a Myocardial Perfusion Imaging Study.   Please arrive 15 minutes prior to your appointment time for registration and insurance purposes.   The test will take approximately 3 to 4 hours to complete; you may bring reading material. If someone comes with you to your appointment, they will need to remain in the main lobby due to limited testing space in the testing area. **If you are pregnant or breastfeeding, please notify the nuclear lab prior to your appointment**   How to prepare for your test:  - Do not eat or drink 3 hours prior to your test, except you may have water  - Do not consume products containing caffeine ( regular or decaf) 12 hours prior to your test ( coffee, chocolate, sodas or teas)  - Do bring a list of your current medications with you. If not listed below, you may take your medications as normal (Hold beta blocker- 24 hour prior for exercise myoview)   - Do wear comfortable clothes (no dresses or overalls) and walking shoes ( tennis shoes preferred), no heel or open toe shoes are allowed - Do not wear cologne, perfume, aftershave, or lotions ( deodorant is allowed)  - If these instructions are not followed, your test will be rescheduled   If you cannot keep your appointment, please provide 24 hours notice to the Nuclear Lab, to avoid a possible $50 charge to your account!    Follow-Up: At [facility], you and your health needs are our priority.  As part of our continuing mission to provide you with exceptional heart care, we have created designated Provider Care Teams.  These Care Teams include your primary  Cardiologist (physician) and Advanced Practice Providers (APPs -  Physician Assistants and Nurse Practitioners) who all work together to provide you with the care you need, when you need it.  We recommend signing up for the patient portal called "MyChart".  Sign up information is provided on this After Visit Summary.  MyChart is used to connect with patients for Virtual Visits (Telemedicine).  Patients are able to view lab/test results, encounter notes, upcoming appointments, etc.  Non-urgent messages can be sent to your provider as well.   To learn more about what you can do with MyChart, go to [email or web address].    Your next appointment:   Follow up scheduled

## 2022-07-15 NOTE — Telephone Encounter (Signed)
Called pt, looked over her chart. I do not see where someone from our office called her. Pt verbalized understanding.

## 2022-07-15 NOTE — Progress Notes (Signed)
Office Visit    Patient Name: BEBE Figueroa Date of Encounter: 07/15/2022  PCP:  [name], [place]  Cardiologist:  Shelva Majestic, MD  Advanced Practice Provider:  Ledora Bottcher, [place] Electrophysiologist:  None      Chief Complaint    Abigail Figueroa is a 62 y.o. female presents today for ED follow up for chest pain   Past Medical History    Past Medical History:  Diagnosis Date   Abnormal stress test    a. 10/2009: Normal myocardial perfusion imaging; b. 08/2015 MV: medium defect of mod severity in mid ant apical region w/ mild HK of the distal inf wall;  c. 08/2015 Cath: nl Cors, EF 60%.   Anemia    Angio-edema    Anxiety    Arthritis    Bell's palsy    Depression    Dermatitis 09/10/2014   Edema    Essential hypertension    Frequent urination    Frequent urination at night    Headache    migraines, gets botox injections   Heart palpitations 10/2011   Event monitor showing sinus tachycardia, PACs with couplets and triplets.   Hx of echocardiogram    a. 10/2009 Echo: showed normal left ventricular function, mild left ventricular hypertrophy, and no significant valve abnormalities.   Hypercholesterolemia    Migraine    Morbid obesity ([place])    Neuromuscular disorder ([facility])    OSA (obstructive sleep apnea)    has been on continuous positive airway pressure   Seizure disorder ([place])    Seizures ([place])    Type II diabetes mellitus ([place])    Urticaria    Past Surgical History:  Procedure Laterality Date   ABDOMINAL HYSTERECTOMY  1990   without BSO   BACK SURGERY  1900s 2001   x2 with "cage put in"   [place] N/A 08/06/2015   Procedure: Left Heart Cath and Coronary Angiography;  Surgeon: Troy Sine, MD;  Location: [facility];  Service: Cardiovascular;  Laterality: N/A;   CARPAL TUNNEL RELEASE Bilateral    CHOLECYSTECTOMY  1980   COLONOSCOPY WITH PROPOFOL  N/A 08/30/2017   Procedure: COLONOSCOPY WITH PROPOFOL;  Surgeon: Lin Landsman, MD;  Location: [facility];  Service: Endoscopy;  Laterality: N/A;   DG THUMB LEFT HAND  01/24/2018   repair joint in left thumb   DORSAL COMPARTMENT RELEASE Left 10/25/2014   Procedure: LEFT FIRST  DORSAL COMPARTMENT RELEASE AND RADIAL TENOSYNOVECTOMY ;  Surgeon: Roseanne Kaufman, MD;  Location: [facility];  Service: Orthopedics;  Laterality: Left;   HAND SURGERY     KNEE SURGERY Right 2012   meniscus tear   KNEE SURGERY  2012   LAMINECTOMY  1995   TUBAL LIGATION  1982   WRIST SURGERY Right     Allergies  Allergies  Allergen Reactions   Morphine Rash and Swelling   Morphine And Related Hives    Nausea and vomiting  Nausea and vomiting   Oxycodone Hives, Itching, Rash, Swelling and Nausea And Vomiting   Oxycodone Hcl Swelling, Hives and Itching   Aimovig [Erenumab-Aooe]     Questionable lip swelling angoiedema   Augmentin [Amoxicillin-Pot Clavulanate] Hives   Morphine Sulfate Itching and Nausea And Vomiting    REACTION: swelling    History of Present Illness    Abigail Figueroa is a 62 y.o. female with a hx of  HTN, palpitations, peripheral edema, HLD, DM2, OSA, depression, migraine, Bell's palsy (2017) last seen 05/25/22 by Dr. Claiborne Billings.  Myoview 08/2015 for preop clearance intermediate risk. LHC 08/06/15 with normal coronary arteries. Sleep study 10/2017 with severe OSA and has difficulties with compliance as well as machine. Echo 03/2022 due to LE edema LVEF 60-65%, OV5IE, RV systolic pressure 33.2 mm, no significant valvular abnormalities.   Last seen 05/25/22 by Dr. Claiborne Billings. She had resumed CPAP with new machine and was using regularly. She contacted the office 05/27/22 noting headache and Imdur was reduced to half tablet.   She presents today for follow up with her husband. Tells me her chest pain woke her up from sleep around midnight and she was clammy and lethargic. She described  it as an ache. Notes the day after her ED visit she still had some chest pain which she rated at a 4-5/10 on pain scale. It occurred at rest or with activity and is midsternal. She folows with headache clinic and is on Emgality, botox injections. She still notes daily headache on Imdur. Unable to utilize Ranexa in place of Imdur due to interaction with carbamazepine.   Notes history of sarcoid in her mother and uncle and is concerned that she may also have it. Notes her mother's was found via dermatologist due to lesion on her face and progressed to lungs.   EKGs/Labs/Other Studies Reviewed:   The following studies were reviewed today: Cardiac Studies & Procedures   CARDIAC CATHETERIZATION  CARDIAC CATHETERIZATION 08/06/2015  Narrative  The left ventricular systolic function is normal.  Normal coronary arteries. Normal LV function with an ejection fraction of at least 60%.  RECOMMENDATION: The patient's stress test is a false positive study, most likely due to her morbid obesity and body habitus.  She has normal coronary arteries and normal LV function.  She is given preoperative clearance for her planned lumbar surgery to be done by Dr. Arnoldo Morale.  Findings Coronary Findings Diagnostic  Dominance: Right  Left Main Vessel was injected. Vessel is normal in caliber. Vessel is angiographically normal.  Left Anterior Descending Vessel was injected. Vessel is normal in caliber. Vessel is angiographically normal.  Ramus Intermedius Vessel was injected. Vessel is normal in caliber. Vessel is angiographically normal.  Left Circumflex Vessel was injected. Vessel is normal in caliber. Vessel is angiographically normal.  Right Coronary Artery Vessel was injected. Vessel is normal in caliber. Vessel is angiographically normal.  Intervention  No interventions have been documented.   STRESS TESTS  MYOCARDIAL PERFUSION IMAGING 07/25/2015  Narrative  Nuclear stress EF: 56%. Mild  hypokinesis in the distal inferior wall segment.  T wave inversion was noted during stress in the II, III, aVF, V5 and V6 leads.  Defect 1: There is a medium defect of moderate severity present in the mid anterior, apical anterior and apex location.  Findings consistent with ischemia. Note, sensitivity and specificity reduced by body habitus, 2 day study.  This is an intermediate risk study.  Candee Furbish, MD   ECHOCARDIOGRAM  ECHOCARDIOGRAM COMPLETE 03/29/2022  Narrative ECHOCARDIOGRAM REPORT    Patient Name:   JESSILYN CATINO Date of Exam: 03/29/2022 Medical Rec #:  [id number]         Height:       63.0 in Accession #:    [id number]        Weight:       195.4 lb Date of Birth:  July 17, 1960         BSA:  1.915 m Patient Age:    61 years          BP:           138/78 mmHg Patient Gender: F                 HR:           71 bpm. Exam Location:  [address]  Procedure: 2D Echo, Cardiac Doppler and Color Doppler  Indications:    R06.00 Dyspnea  History:        Patient has prior history of Echocardiogram examinations, most recent 11/18/2009. Risk Factors:Hypertension, Dyslipidemia and Diabetes. Edema. Palpitations. Obstructive sleep apnea-CPAP.  Sonographer:    Daphine Deutscher RDCS Referring Phys: [id number] ANGELA NICOLE DUKE  IMPRESSIONS   1. Left ventricular ejection fraction, by estimation, is 60 to 65%. The left ventricle has normal function. The left ventricle has no regional wall motion abnormalities. Left ventricular diastolic parameters are consistent with Grade I diastolic dysfunction (impaired relaxation). 2. Right ventricular systolic function is normal. The right ventricular size is normal. There is normal pulmonary artery systolic pressure. The estimated right ventricular systolic pressure is 15.1 mmHg. 3. The mitral valve is normal in structure. Trivial mitral valve regurgitation. No evidence of mitral stenosis. 4. The aortic valve is normal in  structure. Aortic valve regurgitation is not visualized. No aortic stenosis is present. 5. The inferior vena cava is normal in size with greater than 50% respiratory variability, suggesting right atrial pressure of 3 mmHg.  FINDINGS Left Ventricle: Left ventricular ejection fraction, by estimation, is 60 to 65%. The left ventricle has normal function. The left ventricle has no regional wall motion abnormalities. The left ventricular internal cavity size was normal in size. There is no left ventricular hypertrophy. Left ventricular diastolic parameters are consistent with Grade I diastolic dysfunction (impaired relaxation). Normal left ventricular filling pressure.  Right Ventricle: The right ventricular size is normal. No increase in right ventricular wall thickness. Right ventricular systolic function is normal. There is normal pulmonary artery systolic pressure. The tricuspid regurgitant velocity is 1.74 m/s, and with an assumed right atrial pressure of 3 mmHg, the estimated right ventricular systolic pressure is 15.1 mmHg.  Left Atrium: Left atrial size was normal in size.  Right Atrium: Right atrial size was normal in size.  Pericardium: There is no evidence of pericardial effusion.  Mitral Valve: The mitral valve is normal in structure. Trivial mitral valve regurgitation. No evidence of mitral valve stenosis.  Tricuspid Valve: The tricuspid valve is normal in structure. Tricuspid valve regurgitation is trivial. No evidence of tricuspid stenosis.  Aortic Valve: The aortic valve is normal in structure. Aortic valve regurgitation is not visualized. No aortic stenosis is present.  Pulmonic Valve: The pulmonic valve was normal in structure. Pulmonic valve regurgitation is not visualized. No evidence of pulmonic stenosis.  Aorta: The aortic root is normal in size and structure.  Venous: The inferior vena cava is normal in size with greater than 50% respiratory variability, suggesting right  atrial pressure of 3 mmHg.  IAS/Shunts: No atrial level shunt detected by color flow Doppler.   LEFT VENTRICLE PLAX 2D LVIDd:         4.70 cm   Diastology LVIDs:         3.00 cm   LV e' medial:    6.58 cm/s LV PW:         1.00 cm   LV E/e' medial:  11.3 LV IVS:  1.00 cm   LV e' lateral:   7.67 cm/s LVOT diam:     2.00 cm   LV E/e' lateral: 9.7 LV SV:         79 LV SV Index:   41 LVOT Area:     3.14 cm   RIGHT VENTRICLE             IVC RV Basal diam:  3.50 cm     IVC diam: 1.10 cm RV S prime:     15.30 cm/s TAPSE (M-mode): 1.7 cm  LEFT ATRIUM             Index        RIGHT ATRIUM          Index LA diam:        4.60 cm 2.40 cm/m   RA Area:     8.08 cm LA Vol (A2C):   25.5 ml 13.32 ml/m  RA Volume:   15.00 ml 7.83 ml/m LA Vol (A4C):   37.7 ml 19.69 ml/m LA Biplane Vol: 31.9 ml 16.66 ml/m AORTIC VALVE LVOT Vmax:   110.67 cm/s LVOT Vmean:  72.933 cm/s LVOT VTI:    0.251 m  AORTA Ao Root diam: 3.10 cm Ao Asc diam:  3.30 cm  MITRAL VALVE               TRICUSPID VALVE MV Area (PHT): 3.21 cm    TR Peak grad:   12.1 mmHg MV Decel Time: 236 msec    TR Vmax:        174.00 cm/s MV E velocity: 74.40 cm/s MV A velocity: 98.00 cm/s  SHUNTS MV E/A ratio:  0.76        Systemic VTI:  0.25 m Systemic Diam: 2.00 cm  Armanda Magic MD Electronically signed by Armanda Magic MD Signature Date/Time: 03/29/2022/1:26:57 PM    Final              EKG:  EKG is not ordered today  Recent Labs: 03/29/2022: BNP 8.6 04/12/2022: Magnesium 2.5 06/15/2022: TSH 2.070 07/13/2022: ALT 80; BUN 40; Creatinine, Ser 2.24; Hemoglobin 8.2; Platelets 193; Potassium 3.5; Sodium 139  Recent Lipid Panel    Component Value Date/Time   CHOL 132 11/05/2021 0924   CHOL 277 (H) 08/16/2014 0843   TRIG 100 11/05/2021 0924   TRIG 167 (H) 08/16/2014 0843   HDL 73 11/05/2021 0924   HDL 77 08/16/2014 0843   CHOLHDL 1.8 11/05/2021 0924   CHOLHDL 2.8 02/27/2019 0818   VLDL 22 02/27/2019 0818   VLDL  33 08/16/2014 0843   LDLCALC 41 11/05/2021 0924   LDLCALC 190 (H) 02/28/2017 1009   LDLCALC 167 (H) 08/16/2014 0843   Home Medications   Current Meds  Medication Sig   amLODipine (NORVASC) 5 MG tablet Take 1 tablet (5 mg total) by mouth daily. PLEASE KEEP SCHEDULED APPOINTMENT   aspirin EC 81 MG tablet Take 81 mg by mouth daily.   Azelastine HCl 0.15 % SOLN PLACE 2 SPRAYS INTO BOTH NOSTRILS 2 (TWO) TIMES DAILY   BOTOX 100 UNITS SOLR injection Inject into the muscle every 3 (three) months.    busPIRone (BUSPAR) 15 MG tablet TAKE 1 TABLET BY MOUTH TWICE A DAY   carbamazepine (TEGRETOL-XR) 400 MG 12 hr tablet TAKE 1 TABLET BY MOUTH TWICE A DAY   chlorproMAZINE (THORAZINE) 25 MG tablet Take 50 mg by mouth 2 (two) times daily as needed.   chlorzoxazone (PARAFON) 500 MG tablet Take by  mouth 4 (four) times daily as needed for muscle spasms.   clotrimazole-betamethasone (LOTRISONE) cream Apply 1 Application topically 2 (two) times daily.   diclofenac Sodium (VOLTAREN) 1 % GEL Apply topically as needed.   EMGALITY 120 MG/ML SOAJ Inject into the skin as needed.   EPINEPHrine 0.3 mg/0.3 mL IJ SOAJ injection Inject into the muscle.   ezetimibe (ZETIA) 10 MG tablet Take 1 tablet (10 mg total) by mouth daily.   fluocinolone (SYNALAR) 0.01 % external solution Apply topically 2 (two) times daily.   fluticasone (FLONASE) 50 MCG/ACT nasal spray Place 1-2 sprays into both nostrils as needed for allergies or rhinitis.   furosemide (LASIX) 40 MG tablet Take 1 tablet (40 mg total) by mouth daily.   isosorbide dinitrate (ISORDIL) 30 MG tablet Take 15 mg by mouth daily.   lamoTRIgine (LAMICTAL) 100 MG tablet Take 100 mg by mouth 2 (two) times daily.   Lancets (ONETOUCH ULTRASOFT) lancets Test fasting each morning and 2 hours before supper. Retest if having hypoglycemic symptoms. (Patient taking differently: 1 each by Other route as needed for other. Test fasting each morning and 2 hours before supper. Retest if  having hypoglycemic symptoms.)   metoprolol tartrate (LOPRESSOR) 100 MG tablet TAKE 1 TABLET BY MOUTH TWICE A DAY   ONETOUCH VERIO test strip TEST FASTING GLUCOSE DAILY AS DIRECTED   promethazine (PHENERGAN) 25 MG tablet Take 25 mg by mouth every 4 (four) hours as needed.   rosuvastatin (CRESTOR) 20 MG tablet TAKE 1 TABLET BY MOUTH EVERY DAY   Semaglutide,0.25 or 0.5MG /DOS, (OZEMPIC, 0.25 OR 0.5 MG/DOSE,) 2 MG/3ML SOPN Inject 0.25 mg into the skin once a week.   tretinoin (RETIN-A) 0.025 % cream Apply topically at bedtime.   triamcinolone ointment (KENALOG) 0.1 % Apply 1 Application topically 2 (two) times daily.   Vilazodone HCl (VIIBRYD) 40 MG TABS Take 1 tablet (40 mg total) by mouth daily.     Review of Systems      All other systems reviewed and are otherwise negative except as noted above.  Physical Exam    VS:  BP 137/76   Pulse 73   Ht 5\' 3"  (1.6 m)   Wt 190 lb (86.2 kg)   LMP  (LMP Unknown)   BMI 33.66 kg/m  , BMI Body mass index is 33.66 kg/m.  Wt Readings from Last 3 Encounters:  07/15/22 190 lb (86.2 kg)  07/14/22 191 lb 1.6 oz (86.7 kg)  06/15/22 190 lb 1.6 oz (86.2 kg)     GEN: Well nourished, well developed, in no acute distress. HEENT: normal. Neck: Supple, no JVD, carotid bruits, or masses. Cardiac: RRR, no murmurs, rubs, or gallops. No clubbing, cyanosis, edema.  Radials/PT 2+ and equal bilaterally.  Respiratory:  Respirations regular and unlabored, clear to auscultation bilaterally. GI: Soft, nontender, nondistended. MS: No deformity or atrophy. Skin: Warm and dry, no rash. Neuro:  Strength and sensation are intact. Psych: Normal affect.  Assessment & Plan    HTN - BP mildly elevated in clinic. STOP Imdur due to persistent headache. Discussed to monitor BP at home at least 2 hours after medications and sitting for 5-10 minutes. If BP persistently elevated >130/80 at home consider increasing dose of Amlodipine.   Transaminitis - 06/15/22 AST 29, ALT 34  ? 07/13/22 AST 218, ALT 80. Concern for lab error. Plan to repeat LFTs in 2-3 weeks to reassess.  ? Sarcoid - She reports significant concern for sarcoid as her mother and uncle had it.  Mother's presentation with skin then lungs. Discussed typical diagnostic test of cardiac sarcoid would be biopsy which is invasive procedure.CXR 07/13/22 with stable left mid lung scarring (discussed likely related to a prior respiratory illness) but no lung nodule nor hilar adenopathy. Discussed possible testing with Dr. Margaretann Loveless after she read [facility] - would be reasonable to utilize PET or cardiac MRI for evaluation of sarcoid. MyChart message sent to patient to discuss.  OSA - CPAP compliance encouraged. Follows with Dr. Claiborne Billings.   HLD - Continue Rosuvastatin 20mg  QD, Zetia 10mg  QD.   Palpitations - Continue present dose Metoprolol.   Chest discomfort - Will discontinue Imdur as causing significant headache. Unable to use Ranexa due to interaction with carbamezapine. 07/13/22 GFR 24 not appropriate for CT or PET. Due to persistent chest discomfort with mixed typical and atypical features will plan for lexiscan myoview.   Shared Decision Making/Informed Consent The risks [chest pain, shortness of breath, cardiac arrhythmias, dizziness, blood pressure fluctuations, myocardial infarction, stroke/transient ischemic attack, nausea, vomiting, allergic reaction, radiation exposure, metallic taste sensation and life-threatening complications (estimated to be 1 in 10,000)], benefits (risk stratification, diagnosing coronary artery disease, treatment guidance) and alternatives of a nuclear stress test were discussed in detail with Ms. Shimmin and she agrees to proceed.   DM2 - Continue to follow with PCP.   CKD IV - Follows with nephrology. Careful titration of diuretic and antihypertensive.          Disposition: Follow up  as scheduled  with Shelva Majestic, MD or APP.(Scheduled with Fabian Sharp, PA  08/26/22)  Signed, Loel Dubonnet, NP 07/15/2022, 3:23 PM [facility]

## 2022-07-15 NOTE — Telephone Encounter (Signed)
Patient states she is returning a call received yesterday, but she did not receive a voice message.

## 2022-07-16 ENCOUNTER — Telehealth: Payer: Self-pay

## 2022-07-16 ENCOUNTER — Ambulatory Visit (INDEPENDENT_AMBULATORY_CARE_PROVIDER_SITE_OTHER): Payer: Medicare Other | Admitting: Clinical

## 2022-07-16 DIAGNOSIS — F411 Generalized anxiety disorder: Secondary | ICD-10-CM

## 2022-07-16 DIAGNOSIS — F332 Major depressive disorder, recurrent severe without psychotic features: Secondary | ICD-10-CM | POA: Diagnosis not present

## 2022-07-16 NOTE — Progress Notes (Signed)
[place] Counselor/Therapist Progress Note  Patient ID: Abigail Figueroa, MRN: [id number],    Date: 07/16/2022  Time Spent: 12:32pm - 1:26pm : 54 minutes  Treatment Type: Individual Therapy  Reported Symptoms: Patient reported fatigue  Mental Status Exam: Appearance:  Well Groomed     Behavior: Appropriate  Motor: Normal  Speech/Language:  Clear and Coherent  Affect: Appropriate  Mood: normal  Thought process: normal  Thought content:   WNL  Sensory/Perceptual disturbances:   WNL  Orientation: oriented to person, place, and situation  Attention: Good  Concentration: Good  Memory: WNL  Fund of knowledge:  Good  Insight:   Good  Judgment:  Good  Impulse Control: Good   Risk Assessment: Danger to Self:  No Patient denied current suicidal ideation  Self-injurious Behavior: No Danger to Others: No Patient denied current homicidal ideation  Duty to Warn:no Physical Aggression / Violence:No  Access to Firearms a concern: No  Gang Involvement:No   Subjective: Patient stated, "I've been hanging in there". Patient reported she has an appointment with a psychiatrist in April.  Patient stated, "I've been going through a lot with my health". Patient reported she was experiencing chest pains and had to go to the hospital. Patient stated, "I'm sort of tired" in response to recent changes in patient's health. Patient stated, "I want some peace with my health" and reported she has been advocating for herself as it relates to her health. Patient reported she is open to reaching out to her insurance provider for a list of providers that offering marital counseling. Patient reported her husband's tone of voice when they communicate has changed since last session. Patient reported she is limiting her communication with her husband unless her daughter is present. Patient reported her daughter is going to be out of town for three days and reported she is unsure of how to  communicate with her husband while her daughter is out of town. Patient stated, "I think Im doing pretty good" in response to current mood.   Interventions: Cognitive Behavioral Therapy. Clinician conducted session via WebEx video from clinician's home office. Patient provided verbal consent to proceed with telehealth session and participated in session from patient's home. Reviewed events since last session. Discussed upcoming appointment with psychiatrist and status of referral for marital counseling. Discussed recent changes in patient's health and patient's thoughts/feeling in response. Discussed status of patient/husband's communication and patient's motivation for communication when daughter is present. Reviewed fair fighting rules, use of "I" statements, and healthy communication boundaries. Provided psycho education related to establishing healthy boundaries in communication. Challenged thought distortions.    Diagnosis:  Major Depressive Disorder, recurrent, severe without psychotic features   Generalized anxiety disorder   Plan: Patient is to utilize [facility], thought re-framing, relaxation techniques, mindfulness, effective communication, and coping strategies to decrease symptoms associated with Major Depressive Disorder and Generalized Anxiety Disorder. Frequency: bi-weekly  Modality: individual      Long-term goal:   Patient stated, "I want to see a change in the anxiety".      Reduce overall level, frequency, and intensity of the symptoms of anxiety and depression as evidenced by decreased sadness, feelings of anxiety, feeling "lost", lack of energy, difficulty falling and staying asleep, changes in concentration, psychomotor retardation, rapid heart rate, excessive worry, restlessness, feeling jittery, and feeling on edge from 5 to 6 days per week to 0 to 1 days per week per patient reported for at least 3 consecutive months.  Target Date: 02/06/23  Progress:  progressing    Short-term goal:  Develop coping strategies to utilize in response to symptoms of depression/anxiety and stressors   Target Date: 08/07/22  Progress: progressing    Verbally express patient's thoughts and feelings to others and utilize effective communication strategies when expressing patient's thoughts/feelings   Target Date: 08/07/22  Progress: progressing    Verbalize an understanding of the relationship between symptoms of anxiety and the impact on patient's thought patterns and behaviors   Target Date: 08/07/22  Progress: progressing     Katherina Right, LCSW

## 2022-07-16 NOTE — Progress Notes (Signed)
                Hisao Doo, LCSW 

## 2022-07-16 NOTE — Telephone Encounter (Signed)
Introductory phone call made to patient prior to her new patient appointment with Dr. Tasia Catchings on 07/19/22. I introduced myself to patient and answered all questions that she had. Patient thanked me for calling.

## 2022-07-19 ENCOUNTER — Inpatient Hospital Stay: Payer: Medicare Other

## 2022-07-19 ENCOUNTER — Inpatient Hospital Stay: Payer: Medicare Other | Attending: Oncology | Admitting: Oncology

## 2022-07-19 ENCOUNTER — Encounter: Payer: Self-pay | Admitting: Oncology

## 2022-07-19 VITALS — BP 126/70 | HR 64 | Temp 96.0°F | Resp 18 | Wt 190.3 lb

## 2022-07-19 DIAGNOSIS — Z87891 Personal history of nicotine dependence: Secondary | ICD-10-CM

## 2022-07-19 DIAGNOSIS — N184 Chronic kidney disease, stage 4 (severe): Secondary | ICD-10-CM | POA: Diagnosis not present

## 2022-07-19 DIAGNOSIS — D631 Anemia in chronic kidney disease: Secondary | ICD-10-CM | POA: Insufficient documentation

## 2022-07-19 DIAGNOSIS — E611 Iron deficiency: Secondary | ICD-10-CM | POA: Diagnosis not present

## 2022-07-19 DIAGNOSIS — R609 Edema, unspecified: Secondary | ICD-10-CM | POA: Diagnosis not present

## 2022-07-19 LAB — CBC WITH DIFFERENTIAL/PLATELET
Abs Immature Granulocytes: 0.05 10*3/uL (ref 0.00–0.07)
Basophils Absolute: 0.1 10*3/uL (ref 0.0–0.1)
Basophils Relative: 1 %
Eosinophils Absolute: 0.2 10*3/uL (ref 0.0–0.5)
Eosinophils Relative: 5 %
HCT: 28.9 % — ABNORMAL LOW (ref 36.0–46.0)
Hemoglobin: 9.2 g/dL — ABNORMAL LOW (ref 12.0–15.0)
Immature Granulocytes: 1 %
Lymphocytes Relative: 34 %
Lymphs Abs: 1.5 10*3/uL (ref 0.7–4.0)
MCH: 28.3 pg (ref 26.0–34.0)
MCHC: 31.8 g/dL (ref 30.0–36.0)
MCV: 88.9 fL (ref 80.0–100.0)
Monocytes Absolute: 0.3 10*3/uL (ref 0.1–1.0)
Monocytes Relative: 6 %
Neutro Abs: 2.4 10*3/uL (ref 1.7–7.7)
Neutrophils Relative %: 53 %
Platelets: 201 10*3/uL (ref 150–400)
RBC: 3.25 MIL/uL — ABNORMAL LOW (ref 3.87–5.11)
RDW: 13.4 % (ref 11.5–15.5)
WBC: 4.5 10*3/uL (ref 4.0–10.5)
nRBC: 0 % (ref 0.0–0.2)

## 2022-07-19 LAB — FERRITIN: Ferritin: 25 ng/mL (ref 11–307)

## 2022-07-19 LAB — IRON AND TIBC
Iron: 88 ug/dL (ref 28–170)
Saturation Ratios: 23 % (ref 10.4–31.8)
TIBC: 385 ug/dL (ref 250–450)
UIBC: 297 ug/dL

## 2022-07-19 LAB — RETIC PANEL
Immature Retic Fract: 10.7 % (ref 2.3–15.9)
RBC.: 3.27 MIL/uL — ABNORMAL LOW (ref 3.87–5.11)
Retic Count, Absolute: 39.2 10*3/uL (ref 19.0–186.0)
Retic Ct Pct: 1.2 % (ref 0.4–3.1)
Reticulocyte Hemoglobin: 30 pg (ref >27.9)

## 2022-07-19 LAB — LACTATE DEHYDROGENASE: LDH: 144 U/L (ref 98–192)

## 2022-07-19 NOTE — Assessment & Plan Note (Addendum)
I recommend checking CBC, iron TIBC ferritin, suspect anemia secondary to chronic kidney disease. Hemoglobin showed 1.2, ferritin 25, iron saturation normal.  Reticulocyte hemoglobin 30. Recommend IV Venofer to further increase iron stores. I discussed about the potential risks including but not limited to allergic reactions/infusion reactions including anaphylactic reactions, phlebitis, high blood pressure, wheezing, SOB, skin rash, weight gain,dark urine, leg swelling, back pain, headache, nausea and fatigue, etc. Patient tolerates oral iron supplement poorly and desires to achieved higher level of iron faster for adequate hematopoesis. Plan IV venofer weekly x 4 Will repeat hemoglobin a few weeks after IV Venofer treatments.  If still less than 10 despite improvement of iron level.  Recommend erythropoietin replacement therapy.  Patient agrees with the plan. Patient has history of multiple drug allergies.  She will get Benadryl as premed prior to Venofer treatments.

## 2022-07-19 NOTE — Assessment & Plan Note (Signed)
Encourage oral hydration and avoid nephrotoxins.  Previous workup negative for M protein and only slightly increased light chain ratio.  Nonspecific.  Check 24-hour urine protein electrophoresis.

## 2022-07-19 NOTE — Progress Notes (Addendum)
Hematology/Oncology Consult note [phone] [phone]    REFERRING PROVIDER: [name], PA-C  CHIEF COMPLAINTS/REASON FOR VISIT:  Anemia  ASSESSMENT & PLAN:  Anemia in stage 4 chronic kidney disease ([place]) I recommend checking CBC, iron TIBC ferritin, suspect anemia secondary to chronic kidney disease. Hemoglobin showed 1.2, ferritin 25, iron saturation normal.  Reticulocyte hemoglobin 30. Recommend IV Venofer to further increase iron stores. I discussed about the potential risks including but not limited to allergic reactions/infusion reactions including anaphylactic reactions, phlebitis, high blood pressure, wheezing, SOB, skin rash, weight gain,dark urine, leg swelling, back pain, headache, nausea and fatigue, etc. Patient tolerates oral iron supplement poorly and desires to achieved higher level of iron faster for adequate hematopoesis. Plan IV venofer weekly x 4 Will repeat hemoglobin a few weeks after IV Venofer treatments.  If still less than 10 despite improvement of iron level.  Recommend erythropoietin replacement therapy.  Patient agrees with the plan. Patient has history of multiple drug allergies.  She will get Benadryl as premed prior to Venofer treatments.    CKD (chronic kidney disease) stage 4, GFR 15-29 ml/min (HCC) Encourage oral hydration and avoid nephrotoxins.  Previous workup negative for M protein and only slightly increased light chain ratio.  Nonspecific.  Check 24-hour urine protein electrophoresis.  Orders Placed This Encounter  Procedures   Ferritin    Standing Status:   Future    Number of Occurrences:   1    Standing Expiration Date:   01/19/2023   Iron and TIBC    Standing Status:   Future    Number of Occurrences:   1    Standing Expiration Date:   07/19/2023   CBC with Differential/Platelet    Standing Status:   Future    Number of Occurrences:   1    Standing Expiration Date:   07/19/2023   Retic Panel    Standing  Status:   Future    Number of Occurrences:   1    Standing Expiration Date:   07/19/2023   Lactate dehydrogenase    Standing Status:   Future    Number of Occurrences:   1    Standing Expiration Date:   07/19/2023   IFE+PROTEIN ELECTRO, 24-HR UR    Standing Status:   Future    Standing Expiration Date:   07/19/2023   Follow-up 2 months, labs prior to MD +/- Retacrit +/- Venofer. All questions were answered. The patient knows to call the clinic with any problems, questions or concerns.  Earlie Server, MD, PhD [facility] Hematology Oncology 07/19/2022     HISTORY OF PRESENTING ILLNESS:  Abigail Figueroa is a  62 y.o.  female with PMH listed below who was referred to me for anemia Reviewed patient's recent labs that was done.  Patient has progressive worsening of anemia. 07/13/2022, hemoglobin is 8.2, MCV 88.9, normal white count and platelet counts. Patient reports severe fatigue, mild shortness of breath with exertion. She denies recent chest pain on exertion,  , pre-syncopal episodes, or palpitations She had not noticed any recent bleeding such as epistaxis, hematuria.  Patient has intermittent rectal bleeding.  Constipation.  She has upcoming GI appointment for further evaluation.   Patient has chronic kidney disease, stage IV.  She follows up with nephrology.  She has had negative protein electrophoresis.  Light chain ratio was mildly increased which is nonspecific.Marland Kitchen  She is on aspirin 81 mg. Patient was accompanied by husband today.   MEDICAL HISTORY:  Past Medical History:  Diagnosis Date   Abnormal stress test    a. 10/2009: Normal myocardial perfusion imaging; b. 08/2015 MV: medium defect of mod severity in mid ant apical region w/ mild HK of the distal inf wall;  c. 08/2015 Cath: nl Cors, EF 60%.   Anemia    Angio-edema    Anxiety    Arthritis    Bell's palsy    Depression    Dermatitis 09/10/2014   Edema    Essential hypertension    Frequent urination    Frequent  urination at night    Headache    migraines, gets botox injections   Heart palpitations 10/2011   Event monitor showing sinus tachycardia, PACs with couplets and triplets.   Hx of echocardiogram    a. 10/2009 Echo: showed normal left ventricular function, mild left ventricular hypertrophy, and no significant valve abnormalities.   Hypercholesterolemia    Migraine    Morbid obesity ([place])    Neuromuscular disorder ([place])    OSA (obstructive sleep apnea)    has been on continuous positive airway pressure   Seizure disorder ([place])    Seizures ([place])    Type II diabetes mellitus ([place])    Urticaria     SURGICAL HISTORY: Past Surgical History:  Procedure Laterality Date   ABDOMINAL HYSTERECTOMY  1990   without BSO   BACK SURGERY  1900s 2001   x2 with "cage put in"   [place] N/A 08/06/2015   Procedure: Left Heart Cath and Coronary Angiography;  Surgeon: Troy Sine, MD;  Location: [facility];  Service: Cardiovascular;  Laterality: N/A;   CARPAL TUNNEL RELEASE Bilateral    CHOLECYSTECTOMY  1980   COLONOSCOPY WITH PROPOFOL N/A 08/30/2017   Procedure: COLONOSCOPY WITH PROPOFOL;  Surgeon: Lin Landsman, MD;  Location: [facility];  Service: Endoscopy;  Laterality: N/A;   DG THUMB LEFT HAND  01/24/2018   repair joint in left thumb   DORSAL COMPARTMENT RELEASE Left 10/25/2014   Procedure: LEFT FIRST  DORSAL COMPARTMENT RELEASE AND RADIAL TENOSYNOVECTOMY ;  Surgeon: Roseanne Kaufman, MD;  Location: [facility];  Service: Orthopedics;  Laterality: Left;   HAND SURGERY     KNEE SURGERY Right 2012   meniscus tear   KNEE SURGERY  2012   LAMINECTOMY  1995   TUBAL LIGATION  1982   WRIST SURGERY Right     SOCIAL HISTORY: Social History   Socioeconomic History   Marital status: Married    Spouse name: Not on file   Number of children: 2   Years of education: Not on file   Highest education level: 12th grade   Occupational History   Occupation: disabled  Tobacco Use   Smoking status: Former    Types: Cigarettes    Quit date: 01/03/1983    Years since quitting: 39.5   Smokeless tobacco: Never   Tobacco comments:    quit in 1984  Vaping Use   Vaping Use: Never used  Substance and Sexual Activity   Alcohol use: No    Alcohol/week: 0.0 standard drinks of alcohol   Drug use: No   Sexual activity: Never  Other Topics Concern   Not on file  Social History Narrative   ** Merged History Encounter **       She is a married mother of 2, grandmother 3. She tries to get exercise but is not doing any routine program.   She  quit smoking over 25 years ago does not drink alcohol.   Social Determinants of Health   Financial Resource Strain: Low Risk  (11/26/2021)   Overall Financial Resource Strain (CARDIA)    Difficulty of Paying Living Expenses: Not hard at all  Food Insecurity: No Food Insecurity (11/26/2021)   Hunger Vital Sign    Worried About Running Out of Food in the Last Year: Never true    Ran Out of Food in the Last Year: Never true  Transportation Needs: Unmet Transportation Needs (11/26/2021)   PRAPARE - Transportation    Lack of Transportation (Medical): Yes    Lack of Transportation (Non-Medical): Yes  Physical Activity: Insufficiently Active (11/26/2021)   Exercise Vital Sign    Days of Exercise per Week: 7 days    Minutes of Exercise per Session: 20 min  Stress: Stress Concern Present (11/26/2021)   [place]    Feeling of Stress : To some extent  Social Connections: Moderately Isolated (11/26/2021)   Social Connection and Isolation Panel [NHANES]    Frequency of Communication with Friends and Family: More than three times a week    Frequency of Social Gatherings with Friends and Family: More than three times a week    Attends Religious Services: Never    Marine scientist or Organizations: No    Attends  Archivist Meetings: Never    Marital Status: Married  Human resources officer Violence: Not At Risk (11/26/2021)   Humiliation, Afraid, Rape, and Kick questionnaire    Fear of Current or Ex-Partner: No    Emotionally Abused: No    Physically Abused: No    Sexually Abused: No    FAMILY HISTORY: Family History  Problem Relation Age of Onset   Sarcoidosis Mother    Seizures Mother    Heart attack Mother    Alzheimer's disease Maternal Grandmother    Dementia Maternal Grandmother    Hypertension Maternal Grandfather    Diabetes Maternal Grandfather    Breast cancer Neg Hx    Allergic rhinitis Neg Hx    Asthma Neg Hx    Eczema Neg Hx    Urticaria Neg Hx     ALLERGIES:  is allergic to morphine, morphine and related, oxycodone, oxycodone hcl, aimovig [erenumab-aooe], augmentin [amoxicillin-pot clavulanate], and morphine sulfate.  MEDICATIONS:  Current Outpatient Medications  Medication Sig Dispense Refill   amLODipine (NORVASC) 5 MG tablet Take 1 tablet (5 mg total) by mouth daily. PLEASE KEEP SCHEDULED APPOINTMENT 135 tablet 0   aspirin EC 81 MG tablet Take 81 mg by mouth daily.     Azelastine HCl 0.15 % SOLN PLACE 2 SPRAYS INTO BOTH NOSTRILS 2 (TWO) TIMES DAILY 30 mL 11   BOTOX 100 UNITS SOLR injection Inject into the muscle every 3 (three) months.      busPIRone (BUSPAR) 15 MG tablet TAKE 1 TABLET BY MOUTH TWICE A DAY 180 tablet 1   carbamazepine (TEGRETOL-XR) 400 MG 12 hr tablet TAKE 1 TABLET BY MOUTH TWICE A DAY 180 tablet 2   chlorproMAZINE (THORAZINE) 25 MG tablet Take 50 mg by mouth 2 (two) times daily as needed.     chlorzoxazone (PARAFON) 500 MG tablet Take by mouth 4 (four) times daily as needed for muscle spasms.     clotrimazole-betamethasone (LOTRISONE) cream Apply 1 Application topically 2 (two) times daily. 45 g 3   diclofenac Sodium (VOLTAREN) 1 % GEL Apply topically as needed.  EMGALITY 120 MG/ML SOAJ Inject into the skin as needed.     EPINEPHrine 0.3  mg/0.3 mL IJ SOAJ injection Inject into the muscle.     ezetimibe (ZETIA) 10 MG tablet Take 1 tablet (10 mg total) by mouth daily. 90 tablet 3   fluocinolone (SYNALAR) 0.01 % external solution Apply topically 2 (two) times daily.     fluticasone (FLONASE) 50 MCG/ACT nasal spray Place 1-2 sprays into both nostrils as needed for allergies or rhinitis.     furosemide (LASIX) 40 MG tablet Take 1 tablet (40 mg total) by mouth daily. 90 tablet 3   isosorbide dinitrate (ISORDIL) 30 MG tablet Take 15 mg by mouth daily.     lamoTRIgine (LAMICTAL) 100 MG tablet Take 100 mg by mouth 2 (two) times daily.     Lancets (ONETOUCH ULTRASOFT) lancets Test fasting each morning and 2 hours before supper. Retest if having hypoglycemic symptoms. (Patient taking differently: 1 each by Other route as needed for other. Test fasting each morning and 2 hours before supper. Retest if having hypoglycemic symptoms.) 100 each 12   loratadine (CLARITIN) 10 MG tablet Take 1 tablet (10 mg total) by mouth in the morning and at bedtime. 60 tablet 11   metoprolol tartrate (LOPRESSOR) 100 MG tablet TAKE 1 TABLET BY MOUTH TWICE A DAY 180 tablet 2   ONETOUCH VERIO test strip TEST FASTING GLUCOSE DAILY AS DIRECTED 100 strip 3   promethazine (PHENERGAN) 25 MG tablet Take 25 mg by mouth every 4 (four) hours as needed.     rosuvastatin (CRESTOR) 20 MG tablet TAKE 1 TABLET BY MOUTH EVERY DAY 90 tablet 1   Semaglutide,0.25 or 0.5MG /DOS, (OZEMPIC, 0.25 OR 0.5 MG/DOSE,) 2 MG/3ML SOPN Inject 0.25 mg into the skin once a week. 3 mL 0   tretinoin (RETIN-A) 0.025 % cream Apply topically at bedtime.     triamcinolone ointment (KENALOG) 0.1 % Apply 1 Application topically 2 (two) times daily.     Vilazodone HCl (VIIBRYD) 40 MG TABS Take 1 tablet (40 mg total) by mouth daily. 90 tablet 0   No current facility-administered medications for this visit.    Review of Systems  Constitutional:  Positive for fatigue. Negative for appetite change, chills  and fever.  HENT:   Negative for hearing loss and voice change.   Eyes:  Negative for eye problems.  Respiratory:  Negative for chest tightness and cough.   Cardiovascular:  Negative for chest pain.  Gastrointestinal:  Negative for abdominal distention, abdominal pain and blood in stool.  Endocrine: Negative for hot flashes.  Genitourinary:  Negative for difficulty urinating and frequency.   Musculoskeletal:  Negative for arthralgias.  Skin:  Negative for itching and rash.  Neurological:  Negative for extremity weakness.  Hematological:  Negative for adenopathy.  Psychiatric/Behavioral:  Negative for confusion.     PHYSICAL EXAMINATION: ECOG PERFORMANCE STATUS: 1 - Symptomatic but completely ambulatory Vitals:   07/19/22 1117  BP: 126/70  Pulse: 64  Resp: 18  Temp: (!) 96 F (35.6 C)  SpO2: 100%   Filed Weights   07/19/22 1117  Weight: 190 lb 4.8 oz (86.3 kg)    Physical Exam Constitutional:      General: She is not in acute distress. HENT:     Head: Normocephalic and atraumatic.  Eyes:     General: No scleral icterus. Cardiovascular:     Rate and Rhythm: Normal rate and regular rhythm.     Heart sounds: Normal heart sounds.  Pulmonary:  Effort: Pulmonary effort is normal. No respiratory distress.     Breath sounds: No wheezing.  Abdominal:     General: Bowel sounds are normal. There is no distension.     Palpations: Abdomen is soft.  Musculoskeletal:        General: No deformity. Normal range of motion.     Cervical back: Normal range of motion and neck supple.  Skin:    General: Skin is warm and dry.     Findings: No erythema or rash.  Neurological:     Mental Status: She is alert and oriented to person, place, and time. Mental status is at baseline.     Cranial Nerves: No cranial nerve deficit.     Coordination: Coordination normal.  Psychiatric:        Mood and Affect: Mood normal.      LABORATORY DATA:  I have reviewed the data as listed     Latest Ref Rng & Units 07/19/2022   12:13 PM 07/13/2022   12:29 AM 06/15/2022    2:38 PM  CBC  WBC 4.0 - 10.5 K/uL 4.5  4.6  5.8   Hemoglobin 12.0 - 15.0 g/dL 9.2  8.2  9.4   Hematocrit 36.0 - 46.0 % 28.9  26.3  29.1   Platelets 150 - 400 K/uL 201  193  255       Latest Ref Rng & Units 07/13/2022   12:29 AM 06/15/2022    2:38 PM 04/12/2022   12:18 PM  CMP  Glucose 70 - 99 mg/dL 148  122  122   BUN 8 - 23 mg/dL 40  27  24   Creatinine 0.44 - 1.00 mg/dL 2.24  2.40  2.37   Sodium 135 - 145 mmol/L 139  143  140   Potassium 3.5 - 5.1 mmol/L 3.5  4.4  4.2   Chloride 98 - 111 mmol/L 104  105  104   CO2 22 - 32 mmol/L 26  22  22    Calcium 8.9 - 10.3 mg/dL 9.0  10.4  10.3   Total Protein 6.5 - 8.1 g/dL 7.3  7.5  7.8   Total Bilirubin 0.3 - 1.2 mg/dL 0.4  <0.2    Alkaline Phos 38 - 126 U/L 86  92    AST 15 - 41 U/L 218  29    ALT 0 - 44 U/L 80  34        Component Value Date/Time   IRON 88 07/19/2022 1213   TIBC 385 07/19/2022 1213   FERRITIN 25 07/19/2022 1213   IRONPCTSAT 23 07/19/2022 1213     RADIOGRAPHIC STUDIES: I have personally reviewed the radiological images as listed and agreed with the findings in the report. DG Chest Portable 1 View  Result Date: 07/13/2022 CLINICAL DATA:  Chest pain EXAM: PORTABLE CHEST 1 VIEW COMPARISON:  07/30/2015 FINDINGS: The heart size and mediastinal contours are within normal limits. Both lungs are clear. Stable scarring left mid lung laterally. The visualized skeletal structures are unremarkable. IMPRESSION: No active disease. Electronically Signed   By: Placido Sou M.D.   On: 07/13/2022 01:27

## 2022-07-19 NOTE — Addendum Note (Signed)
Addended by: Earlie Server on: 07/19/2022 08:40 PM   Modules accepted: Orders

## 2022-07-20 ENCOUNTER — Encounter (HOSPITAL_COMMUNITY): Payer: Self-pay

## 2022-07-20 ENCOUNTER — Ambulatory Visit: Payer: Medicare Other | Admitting: General Practice

## 2022-07-20 ENCOUNTER — Telehealth (HOSPITAL_COMMUNITY): Payer: Self-pay | Admitting: *Deleted

## 2022-07-20 ENCOUNTER — Telehealth: Payer: Self-pay

## 2022-07-20 NOTE — Telephone Encounter (Signed)
Called Abigail Figueroa and notified her that Dr. Tasia Catchings recommends IV Venofer treatments weekly x 4. Abigail Figueroa is agreeable to this. I will send message to scheduling to schedule and notify Abigail Figueroa of appointment dates and times.

## 2022-07-20 NOTE — Telephone Encounter (Signed)
Pt reached but was at an appointment. Asked me to send instructions for MPI study scheduled on 07/22/22 via mychart Mychart message sent.

## 2022-07-20 NOTE — Telephone Encounter (Signed)
-----   Message from Earlie Server, MD sent at 07/19/2022  8:34 PM EDT ----- Please let her know that I recommend IV Venofer treatments.  Please arrange IV Venofer weekly x 4.  Follow-up in 2 months, labs prior to MD +/- Retacrit +/- Venofer.  Labs are ordered.  Thank you

## 2022-07-21 ENCOUNTER — Encounter (HOSPITAL_BASED_OUTPATIENT_CLINIC_OR_DEPARTMENT_OTHER): Payer: Self-pay | Admitting: Family

## 2022-07-22 ENCOUNTER — Ambulatory Visit (HOSPITAL_COMMUNITY): Payer: Medicare Other | Attending: Internal Medicine

## 2022-07-22 ENCOUNTER — Encounter (HOSPITAL_BASED_OUTPATIENT_CLINIC_OR_DEPARTMENT_OTHER): Payer: Self-pay

## 2022-07-22 DIAGNOSIS — R079 Chest pain, unspecified: Secondary | ICD-10-CM | POA: Insufficient documentation

## 2022-07-22 DIAGNOSIS — G4733 Obstructive sleep apnea (adult) (pediatric): Secondary | ICD-10-CM | POA: Insufficient documentation

## 2022-07-22 DIAGNOSIS — R748 Abnormal levels of other serum enzymes: Secondary | ICD-10-CM | POA: Diagnosis not present

## 2022-07-22 DIAGNOSIS — E782 Mixed hyperlipidemia: Secondary | ICD-10-CM

## 2022-07-22 DIAGNOSIS — I10 Essential (primary) hypertension: Secondary | ICD-10-CM | POA: Diagnosis not present

## 2022-07-22 DIAGNOSIS — N184 Chronic kidney disease, stage 4 (severe): Secondary | ICD-10-CM | POA: Insufficient documentation

## 2022-07-22 LAB — MYOCARDIAL PERFUSION IMAGING
LV dias vol: 75 mL (ref 46–106)
LV sys vol: 27 mL
Nuc Stress EF: 64 %
Peak HR: 93 {beats}/min
Rest HR: 66 {beats}/min
Rest Nuclear Isotope Dose: 10.8 mCi
SDS: 0
SRS: 0
SSS: 0
ST Depression (mm): 0 mm
Stress Nuclear Isotope Dose: 31.1 mCi
TID: 0.89

## 2022-07-22 MED ORDER — TECHNETIUM TC 99M TETROFOSMIN IV KIT
10.8000 | PACK | Freq: Once | INTRAVENOUS | Status: AC | PRN
  Administered 2022-07-22: 10.8 via INTRAVENOUS

## 2022-07-22 MED ORDER — TECHNETIUM TC 99M TETROFOSMIN IV KIT
31.1000 | PACK | Freq: Once | INTRAVENOUS | Status: AC | PRN
  Administered 2022-07-22: 31.1 via INTRAVENOUS

## 2022-07-22 MED ORDER — REGADENOSON 0.4 MG/5ML IV SOLN
0.4000 mg | Freq: Once | INTRAVENOUS | Status: AC
  Administered 2022-07-22: 0.4 mg via INTRAVENOUS

## 2022-07-23 ENCOUNTER — Ambulatory Visit (INDEPENDENT_AMBULATORY_CARE_PROVIDER_SITE_OTHER): Payer: Medicare Other | Admitting: Clinical

## 2022-07-23 DIAGNOSIS — F332 Major depressive disorder, recurrent severe without psychotic features: Secondary | ICD-10-CM | POA: Diagnosis not present

## 2022-07-23 DIAGNOSIS — F411 Generalized anxiety disorder: Secondary | ICD-10-CM

## 2022-07-23 NOTE — Progress Notes (Signed)
[facility] Counselor/Therapist Progress Note  Patient ID: Abigail Figueroa, MRN: [id number],    Date: 07/23/2022  Time Spent: 12:33pm - 1:26pm : 53 minutes  Treatment Type: Individual Therapy  Reported Symptoms: Patient stated, "its pretty good" in response to current mood.   Mental Status Exam: Appearance:  Well Groomed     Behavior: Appropriate  Motor: Normal  Speech/Language:  Clear and Coherent  Affect: Appropriate  Mood: normal  Thought process: normal  Thought content:   WNL  Sensory/Perceptual disturbances:   WNL  Orientation: oriented to person, place, and situation  Attention: Good  Concentration: Good  Memory: WNL  Fund of knowledge:  Good  Insight:   Good  Judgment:  Good  Impulse Control: Good   Risk Assessment: Danger to Self:  No Patient denied current suicidal ideation  Self-injurious Behavior: No Danger to Others: No Patient denied current homicidal ideation  Duty to Warn:no Physical Aggression / Violence:No  Access to Firearms a concern: No  Gang Involvement:No   Subjective: Patient stated, "it's been going pretty good". Patient reported she and her husband got along well during her daughter's absence. Patient stated, "we did real well" and stated, "I was pleased" in response to patient's/husband's communication while daughter was out of town. Patient reported there was no name calling, arguing, talking over each other and reported her husband asked patient to play cards with him,  Patient stated, "it shocked me". Patient reported she and husband have not spent time together alone in two years. Patient reported the last time she and her husband spent time alone was a trip to the mountains two years ago and reported they had an enjoyable trip. Patient reported she and her husband get along well when they spend time together alone. Patient stated, "we haven't had time for each other".  Patient reported negative thoughts that patient/husband will  resume arguing since daughter has returned home. Patient stated, "I stay so angry and I just look at everything so negative". Patient reported when her children were younger patient/husband had date nights and patient reported plans to ask husband to resume date nights.   Interventions: Cognitive Behavioral Therapy. Clinician conducted session via WebEx video from clinician's home office. Patient provided verbal consent to proceed with telehealth session and participated in session from patient's home. Reviewed events since last session. Explored patient's/husband's communication during daughter's absence and contributing factors to difference in communication during daughter's absence. Provided psycho education related to components of a healthy relationship and growth in relationships. Challenged thought distortions vocalized during session and explored alternative perspectives. Discussed using positive self talk and challenging thought distortions. Explored and identified activities patient/husband could participate in to increase time spent together, such as, fishing, attending local concerts, sitting on the porch, playing cards.    Diagnosis:  Major Depressive Disorder, recurrent, severe without psychotic features   Generalized anxiety disorder   Plan: Patient is to utilize [facility], thought re-framing, relaxation techniques, mindfulness, effective communication, and coping strategies to decrease symptoms associated with Major Depressive Disorder and Generalized Anxiety Disorder. Frequency: bi-weekly  Modality: individual      Long-term goal:   Patient stated, "I want to see a change in the anxiety".      Reduce overall level, frequency, and intensity of the symptoms of anxiety and depression as evidenced by decreased sadness, feelings of anxiety, feeling "lost", lack of energy, difficulty falling and staying asleep, changes in concentration, psychomotor retardation, rapid  heart rate, excessive worry, restlessness, feeling  jittery, and feeling on edge from 5 to 6 days per week to 0 to 1 days per week per patient reported for at least 3 consecutive months.    Target Date: 02/06/23  Progress: progressing    Short-term goal:  Develop coping strategies to utilize in response to symptoms of depression/anxiety and stressors   Target Date: 08/07/22  Progress: progressing    Verbally express patient's thoughts and feelings to others and utilize effective communication strategies when expressing patient's thoughts/feelings   Target Date: 08/07/22  Progress: progressing    Verbalize an understanding of the relationship between symptoms of anxiety and the impact on patient's thought patterns and behaviors   Target Date: 08/07/22  Progress: progressing     Katherina Right, LCSW

## 2022-07-23 NOTE — Progress Notes (Signed)
                Rosilyn Coachman, LCSW 

## 2022-07-27 ENCOUNTER — Other Ambulatory Visit: Payer: Self-pay

## 2022-07-27 ENCOUNTER — Inpatient Hospital Stay: Payer: Medicare Other

## 2022-07-27 VITALS — BP 107/60 | HR 65 | Temp 97.5°F | Resp 16

## 2022-07-27 DIAGNOSIS — N184 Chronic kidney disease, stage 4 (severe): Secondary | ICD-10-CM

## 2022-07-27 DIAGNOSIS — D631 Anemia in chronic kidney disease: Secondary | ICD-10-CM | POA: Diagnosis not present

## 2022-07-27 DIAGNOSIS — Z87891 Personal history of nicotine dependence: Secondary | ICD-10-CM | POA: Diagnosis not present

## 2022-07-27 DIAGNOSIS — E611 Iron deficiency: Secondary | ICD-10-CM | POA: Diagnosis not present

## 2022-07-27 MED ORDER — SODIUM CHLORIDE 0.9 % IV SOLN
Freq: Once | INTRAVENOUS | Status: AC
  Filled 2022-07-27: qty 250

## 2022-07-27 MED ORDER — SODIUM CHLORIDE 0.9 % IV SOLN
200.0000 mg | Freq: Once | INTRAVENOUS | Status: AC
  Administered 2022-07-27: 200 mg via INTRAVENOUS
  Filled 2022-07-27: qty 200

## 2022-07-27 MED ORDER — DIPHENHYDRAMINE HCL 50 MG/ML IJ SOLN
25.0000 mg | INTRAMUSCULAR | Status: DC | PRN
  Administered 2022-07-27: 25 mg via INTRAVENOUS
  Filled 2022-07-27: qty 1

## 2022-07-27 NOTE — Patient Instructions (Signed)

## 2022-07-27 NOTE — Progress Notes (Signed)
Per Dr Tasia Catchings, please give benadryl prior to venofer infusions due to long allergy list.

## 2022-07-28 LAB — IFE+PROTEIN ELECTRO, 24-HR UR
% BETA, Urine: 34.4 %
ALPHA 1 URINE: 5.5 %
Albumin, U: 19.8 %
Alpha 2, Urine: 15.5 %
GAMMA GLOBULIN URINE: 24.8 %
Total Protein, Urine-Ur/day: 83 mg/24 hr (ref 30–150)
Total Protein, Urine: 16.6 mg/dL
Total Volume: 500

## 2022-07-28 NOTE — Progress Notes (Unsigned)
Abigail Figueroa,acting as a scribe for [employer], PA-C.,have documented all relevant documentation on the behalf of Abigail Kirschner, PA-C,as directed by  Abigail Kirschner, PA-C while in the presence of Abigail Kirschner, PA-C.   Established patient visit   Patient: Abigail Figueroa   DOB: [birth year]   62 y.o. Female  MRN: [id number] Visit Date: 07/29/2022  Today's healthcare provider: [name], PA-C   Cc. Chronic conditions, establish w/ new provider  Subjective    HPI  Pt presents today to establish with a new provider. She has concerns about ongoing, migraines, chest pain. She noticed elevated liver enzymes in her last round of bloodwork. She would like a referral to a new nephrologist if possible.   Medications: Outpatient Medications Prior to Visit  Medication Sig   amLODipine (NORVASC) 5 MG tablet Take 1 tablet (5 mg total) by mouth daily. PLEASE KEEP SCHEDULED APPOINTMENT   aspirin EC 81 MG tablet Take 81 mg by mouth daily.   BOTOX 100 UNITS SOLR injection Inject into the muscle every 3 (three) months.    busPIRone (BUSPAR) 15 MG tablet TAKE 1 TABLET BY MOUTH TWICE A DAY   carbamazepine (TEGRETOL-XR) 400 MG 12 hr tablet TAKE 1 TABLET BY MOUTH TWICE A DAY   chlorproMAZINE (THORAZINE) 25 MG tablet Take 50 mg by mouth 2 (two) times daily as needed.   chlorzoxazone (PARAFON) 500 MG tablet Take by mouth 4 (four) times daily as needed for muscle spasms.   clotrimazole-betamethasone (LOTRISONE) cream Apply 1 Application topically 2 (two) times daily.   diclofenac Sodium (VOLTAREN) 1 % GEL Apply topically as needed.   EMGALITY 120 MG/ML SOAJ Inject into the skin as needed.   EPINEPHrine 0.3 mg/0.3 mL IJ SOAJ injection Inject into the muscle.   ezetimibe (ZETIA) 10 MG tablet Take 1 tablet (10 mg total) by mouth daily.   fluocinolone (SYNALAR) 0.01 % external solution Apply topically 2 (two) times daily.   fluticasone (FLONASE) 50 MCG/ACT nasal spray Place 1-2 sprays into  both nostrils as needed for allergies or rhinitis.   furosemide (LASIX) 40 MG tablet Take 1 tablet (40 mg total) by mouth daily.   lamoTRIgine (LAMICTAL) 100 MG tablet Take 100 mg by mouth 2 (two) times daily.   Lancets (ONETOUCH ULTRASOFT) lancets Test fasting each morning and 2 hours before supper. Retest if having hypoglycemic symptoms. (Patient taking differently: 1 each by Other route as needed for other. Test fasting each morning and 2 hours before supper. Retest if having hypoglycemic symptoms.)   metoprolol tartrate (LOPRESSOR) 100 MG tablet TAKE 1 TABLET BY MOUTH TWICE A DAY   ONETOUCH VERIO test strip TEST FASTING GLUCOSE DAILY AS DIRECTED   promethazine (PHENERGAN) 25 MG tablet Take 25 mg by mouth every 4 (four) hours as needed.   rosuvastatin (CRESTOR) 20 MG tablet TAKE 1 TABLET BY MOUTH EVERY DAY   Semaglutide,0.25 or 0.5MG /DOS, (OZEMPIC, 0.25 OR 0.5 MG/DOSE,) 2 MG/3ML SOPN Inject 0.25 mg into the skin once a week.   tretinoin (RETIN-A) 0.025 % cream Apply topically at bedtime.   triamcinolone ointment (KENALOG) 0.1 % Apply 1 Application topically 2 (two) times daily.   Vilazodone HCl (VIIBRYD) 40 MG TABS Take 1 tablet (40 mg total) by mouth daily.   [DISCONTINUED] metFORMIN (GLUCOPHAGE) 500 MG tablet Take 500 mg by mouth.   Azelastine HCl 0.15 % SOLN PLACE 2 SPRAYS INTO BOTH NOSTRILS 2 (TWO) TIMES DAILY   loratadine (CLARITIN) 10 MG tablet Take 1 tablet (10  mg total) by mouth in the morning and at bedtime.   [DISCONTINUED] isosorbide dinitrate (ISORDIL) 30 MG tablet Take 15 mg by mouth daily.   No facility-administered medications prior to visit.    Review of Systems  Constitutional:  Positive for fatigue.  Respiratory:  Positive for chest tightness (5 pain score) and shortness of breath.   Neurological:  Positive for dizziness, light-headedness and headaches.     Objective    BP 123/69 (BP Location: Right Arm, Patient Position: Sitting, Cuff Size: Normal)   Pulse 66   Temp  98 F (36.7 C) (Oral)   Resp 11   Ht 5\' 3"  (1.6 m)   Wt 188 lb 6.4 oz (85.5 kg)   LMP  (LMP Unknown)   SpO2 100%   BMI 33.37 kg/m  Blood pressure 123/69, pulse 66, temperature 98 F (36.7 C), temperature source Oral, resp. rate 11, height 5\' 3"  (1.6 m), weight 188 lb 6.4 oz (85.5 kg), SpO2 100 %.   Physical Exam Constitutional:      General: She is awake.     Appearance: She is well-developed.  HENT:     Head: Normocephalic.  Eyes:     Conjunctiva/sclera: Conjunctivae normal.  Cardiovascular:     Rate and Rhythm: Normal rate and regular rhythm.     Heart sounds: Normal heart sounds.  Pulmonary:     Effort: Pulmonary effort is normal.     Breath sounds: Normal breath sounds.  Musculoskeletal:     Right lower leg: No edema.     Left lower leg: No edema.  Skin:    General: Skin is warm.  Neurological:     Mental Status: She is alert and oriented to person, place, and time.  Psychiatric:        Attention and Perception: Attention normal.        Mood and Affect: Mood normal.        Speech: Speech normal.        Behavior: Behavior is cooperative.     No results found for any visits on 07/29/22.  Assessment & Plan     Problem List Items Addressed This Visit       Cardiovascular and Mediastinum   Essential hypertension    Well controlled today Continue current medications F/b  cardiology      Migraine    Per pt well controlled, 1-2 a week Follow back neurology         Endocrine   Type 2 diabetes mellitus with stage 4 chronic kidney disease, without long-term current use of insulin (HCC)    Last A1c <7%.  Pt managed on only ozempic 0.25 mg. She has an upcoming appt with endo, advised keeping this appt; if they do not make changes and only monitor, we are able to do this as well. She is on a statin  No ace arb but may be limited d/t ckd.  F/u pending endocrinology visit        Genitourinary   CKD (chronic kidney disease) stage 4, GFR 15-29 ml/min (HCC) -  Primary (Chronic)    Recent decrease in kidney function Pt requests ref to new specialist, sent. Discussed fluids, avoiding certain meds.  Reviewed her med list, only problematic agent is lasix -- will leave up to cardiology/nephrology to manage further      Relevant Orders   Ambulatory referral to Nephrology   Anemia in stage 4 chronic kidney disease ([place]) (Chronic)    Followed by hematology  Other   Other chest pain    Closely monitored by cardiology.  Pt is considering a w/u for sarcoidosis given family history and symptoms      Other Visit Diagnoses     Need for COVID-19 vaccine       Relevant Orders   Pfizer Fall 2023 Covid-19 Vaccine 9yrs and older (Completed)   Needs flu shot       Relevant Orders   Flu Vaccine QUAD High Dose(Fluad) (Completed)        Return in about 6 months (around 01/29/2023) for chronic conditions.      I, Abigail Kirschner, PA-C have reviewed all documentation for this visit. The documentation on  07/29/22  for the exam, diagnosis, procedures, and orders are all accurate and complete.  Abigail Kirschner, PA-C [facility] [address] [place], [place], [address] Office: [phone] Fax: [place]

## 2022-07-29 ENCOUNTER — Encounter: Payer: Self-pay | Admitting: Physician Assistant

## 2022-07-29 ENCOUNTER — Ambulatory Visit (INDEPENDENT_AMBULATORY_CARE_PROVIDER_SITE_OTHER): Payer: Medicare Other | Admitting: Physician Assistant

## 2022-07-29 ENCOUNTER — Ambulatory Visit (INDEPENDENT_AMBULATORY_CARE_PROVIDER_SITE_OTHER): Payer: Medicare Other | Admitting: Clinical

## 2022-07-29 VITALS — BP 123/69 | HR 66 | Temp 98.0°F | Resp 11 | Ht 63.0 in | Wt 188.4 lb

## 2022-07-29 DIAGNOSIS — Z23 Encounter for immunization: Secondary | ICD-10-CM | POA: Diagnosis not present

## 2022-07-29 DIAGNOSIS — E1122 Type 2 diabetes mellitus with diabetic chronic kidney disease: Secondary | ICD-10-CM

## 2022-07-29 DIAGNOSIS — N184 Chronic kidney disease, stage 4 (severe): Secondary | ICD-10-CM | POA: Diagnosis not present

## 2022-07-29 DIAGNOSIS — F411 Generalized anxiety disorder: Secondary | ICD-10-CM | POA: Diagnosis not present

## 2022-07-29 DIAGNOSIS — F332 Major depressive disorder, recurrent severe without psychotic features: Secondary | ICD-10-CM | POA: Diagnosis not present

## 2022-07-29 DIAGNOSIS — I10 Essential (primary) hypertension: Secondary | ICD-10-CM | POA: Diagnosis not present

## 2022-07-29 DIAGNOSIS — R0789 Other chest pain: Secondary | ICD-10-CM | POA: Diagnosis not present

## 2022-07-29 DIAGNOSIS — D631 Anemia in chronic kidney disease: Secondary | ICD-10-CM | POA: Diagnosis not present

## 2022-07-29 DIAGNOSIS — G43109 Migraine with aura, not intractable, without status migrainosus: Secondary | ICD-10-CM

## 2022-07-29 NOTE — Assessment & Plan Note (Signed)
Closely monitored by cardiology.  Pt is considering a w/u for sarcoidosis given family history and symptoms

## 2022-07-29 NOTE — Assessment & Plan Note (Signed)
Well controlled today Continue current medications F/b  cardiology

## 2022-07-29 NOTE — Assessment & Plan Note (Signed)
Followed by hematology 

## 2022-07-29 NOTE — Assessment & Plan Note (Signed)
Recent decrease in kidney function Pt requests ref to new specialist, sent. Discussed fluids, avoiding certain meds.  Reviewed her med list, only problematic agent is lasix -- will leave up to cardiology/nephrology to manage further

## 2022-07-29 NOTE — Progress Notes (Signed)
                Masako Overall, LCSW 

## 2022-07-29 NOTE — Progress Notes (Signed)
[place] Counselor/Therapist Progress Note  Patient ID: Abigail Figueroa, MRN: [id number],    Date: 07/29/2022  Time Spent: 12:30pm - 1:18pm : 48 minutes   Treatment Type: Individual Therapy  Reported Symptoms: Patient reported recent feelings of anger  Mental Status Exam: Appearance:  Well Groomed     Behavior: Appropriate  Motor: normal  Speech/Language:  Clear and Coherent  Affect: Appropriate  Mood: normal  Thought process: normal  Thought content:   WNL  Sensory/Perceptual disturbances:   WNL  Orientation: oriented to person, place, and situation  Attention: Good  Concentration: Good  Memory: WNL  Fund of knowledge:  Good  Insight:   Good  Judgment:  Good  Impulse Control: Good   Risk Assessment: Danger to Self:  No Patient denied current suicidal ideation  Self-injurious Behavior: No Danger to Others: No Patient denied current homicidal ideation  Duty to Warn:no Physical Aggression / Violence:No  Access to Firearms a concern: No  Gang Involvement:No   Subjective: Patient stated, "things have been going pretty good".  Patient reported she and her husband have been able to communicate with each other and stated, "we've been doing pretty good with communicating". Patient reported she noticed a change in her husband's communication recently and reported her husband was being quiet. Patient reported her husband later indicated he wasn't feeling well. Patient reported she became angry in response to her husband being quiet. Patient stated, "I talked myself down" and reported she talked to her daughter about the situation. Patient reported she plans to have a conversation with her husband once his health improves regarding the recent change in communication. During today's session, patient practiced the conversation she would like to have with her husband. Patient reported she has been thinking about the discussion during her previous session regarding the  lack of time patient/husband spend together and reported she suggested patient/husband could go fishing together.  Patient stated, "I feel pretty good right now" in response to current mood.   Interventions: Cognitive Behavioral Therapy and Assertiveness/Communication. Clinician conducted session via WebEx video from clinician's office at [facility]. Patient provided verbal consent to proceed with telehealth session and participated in session from patient's home. Reviewed events since last session. Explored additional changes in patient/husband's communication with each other since daughter's return. Explored and identified trigger for recent feelings of anger towards husband. Explored and identified coping strategies patient utilized in response to feelings of anger. Challenged patient's approach to conversation with husband and provided psycho education related to healthy communication and use of "I" statements. Clinician requested patient practice how she plans to approach conversation with her husband and practice use of "I" statements during session. Discussed patient writing down the conversation in preparation and practice conversation with use of "I" statements.    Diagnosis:  Major Depressive Disorder, recurrent, severe without psychotic features   Generalized anxiety disorder   Plan: Patient is to utilize [employer] Therapy, thought re-framing, relaxation techniques, mindfulness, effective communication, and coping strategies to decrease symptoms associated with Major Depressive Disorder and Generalized Anxiety Disorder. Frequency: bi-weekly  Modality: individual      Long-term goal:   Patient stated, "I want to see a change in the anxiety".      Reduce overall level, frequency, and intensity of the symptoms of anxiety and depression as evidenced by decreased sadness, feelings of anxiety, feeling "lost", lack of energy, difficulty falling and staying asleep, changes in  concentration, psychomotor retardation, rapid heart rate, excessive worry, restlessness, feeling  jittery, and feeling on edge from 5 to 6 days per week to 0 to 1 days per week per patient reported for at least 3 consecutive months.    Target Date: 02/06/23  Progress: progressing    Short-term goal:  Develop coping strategies to utilize in response to symptoms of depression/anxiety and stressors   Target Date: 08/07/22  Progress: progressing    Verbally express patient's thoughts and feelings to others and utilize effective communication strategies when expressing patient's thoughts/feelings   Target Date: 08/07/22  Progress: progressing    Verbalize an understanding of the relationship between symptoms of anxiety and the impact on patient's thought patterns and behaviors   Target Date: 08/07/22  Progress: progressing    Katherina Right, LCSW

## 2022-07-29 NOTE — Assessment & Plan Note (Addendum)
Last A1c <7%.  Pt managed on only ozempic 0.25 mg. She has an upcoming appt with endo, advised keeping this appt; if they do not make changes and only monitor, we are able to do this as well. She is on a statin  No ace arb but may be limited d/t ckd.  F/u pending endocrinology visit

## 2022-07-29 NOTE — Assessment & Plan Note (Signed)
Per pt well controlled, 1-2 a week Follow back neurology

## 2022-08-04 ENCOUNTER — Ambulatory Visit: Payer: Medicare Other | Admitting: Student

## 2022-08-04 DIAGNOSIS — G43719 Chronic migraine without aura, intractable, without status migrainosus: Secondary | ICD-10-CM | POA: Diagnosis not present

## 2022-08-04 DIAGNOSIS — M542 Cervicalgia: Secondary | ICD-10-CM | POA: Diagnosis not present

## 2022-08-04 DIAGNOSIS — M791 Myalgia, unspecified site: Secondary | ICD-10-CM | POA: Diagnosis not present

## 2022-08-05 ENCOUNTER — Inpatient Hospital Stay: Payer: Medicare Other | Attending: Oncology

## 2022-08-05 VITALS — BP 106/57 | HR 65 | Temp 97.0°F | Resp 18

## 2022-08-05 DIAGNOSIS — D509 Iron deficiency anemia, unspecified: Secondary | ICD-10-CM | POA: Insufficient documentation

## 2022-08-05 DIAGNOSIS — D631 Anemia in chronic kidney disease: Secondary | ICD-10-CM | POA: Diagnosis not present

## 2022-08-05 DIAGNOSIS — N184 Chronic kidney disease, stage 4 (severe): Secondary | ICD-10-CM | POA: Diagnosis not present

## 2022-08-05 MED ORDER — SODIUM CHLORIDE 0.9 % IV SOLN
Freq: Once | INTRAVENOUS | Status: AC
  Filled 2022-08-05: qty 250

## 2022-08-05 MED ORDER — DIPHENHYDRAMINE HCL 50 MG/ML IJ SOLN
25.0000 mg | INTRAMUSCULAR | Status: DC | PRN
  Administered 2022-08-05: 25 mg via INTRAVENOUS
  Filled 2022-08-05: qty 1

## 2022-08-05 MED ORDER — SODIUM CHLORIDE 0.9 % IV SOLN
200.0000 mg | Freq: Once | INTRAVENOUS | Status: AC
  Administered 2022-08-05: 200 mg via INTRAVENOUS
  Filled 2022-08-05: qty 200

## 2022-08-05 MED ORDER — EPOETIN ALFA-EPBX 40000 UNIT/ML IJ SOLN: 20000.0000 [IU] | Freq: Once | INTRAMUSCULAR | Status: DC

## 2022-08-05 NOTE — Patient Instructions (Signed)

## 2022-08-09 DIAGNOSIS — D1771 Benign lipomatous neoplasm of kidney: Secondary | ICD-10-CM | POA: Diagnosis not present

## 2022-08-09 DIAGNOSIS — D179 Benign lipomatous neoplasm, unspecified: Secondary | ICD-10-CM | POA: Diagnosis not present

## 2022-08-10 ENCOUNTER — Inpatient Hospital Stay: Payer: Medicare Other

## 2022-08-10 VITALS — BP 119/77 | HR 71 | Temp 97.3°F | Resp 18

## 2022-08-10 DIAGNOSIS — N184 Chronic kidney disease, stage 4 (severe): Secondary | ICD-10-CM | POA: Diagnosis not present

## 2022-08-10 DIAGNOSIS — D509 Iron deficiency anemia, unspecified: Secondary | ICD-10-CM | POA: Diagnosis not present

## 2022-08-10 DIAGNOSIS — D631 Anemia in chronic kidney disease: Secondary | ICD-10-CM

## 2022-08-10 MED ORDER — SODIUM CHLORIDE 0.9 % IV SOLN
Freq: Once | INTRAVENOUS | Status: AC
  Filled 2022-08-10: qty 250

## 2022-08-10 MED ORDER — SODIUM CHLORIDE 0.9 % IV SOLN
200.0000 mg | Freq: Once | INTRAVENOUS | Status: AC
  Administered 2022-08-10: 200 mg via INTRAVENOUS
  Filled 2022-08-10: qty 200

## 2022-08-10 MED ORDER — DIPHENHYDRAMINE HCL 50 MG/ML IJ SOLN
25.0000 mg | INTRAMUSCULAR | Status: DC | PRN
  Administered 2022-08-10: 25 mg via INTRAVENOUS
  Filled 2022-08-10: qty 1

## 2022-08-10 NOTE — Patient Instructions (Signed)

## 2022-08-11 ENCOUNTER — Encounter: Payer: Medicare Other | Attending: Physician Assistant | Admitting: Dietician

## 2022-08-11 ENCOUNTER — Encounter: Payer: Self-pay | Admitting: Dietician

## 2022-08-11 VITALS — Ht 63.0 in | Wt 191.3 lb

## 2022-08-11 DIAGNOSIS — Z713 Dietary counseling and surveillance: Secondary | ICD-10-CM | POA: Insufficient documentation

## 2022-08-11 DIAGNOSIS — E782 Mixed hyperlipidemia: Secondary | ICD-10-CM | POA: Diagnosis not present

## 2022-08-11 DIAGNOSIS — N184 Chronic kidney disease, stage 4 (severe): Secondary | ICD-10-CM | POA: Insufficient documentation

## 2022-08-11 DIAGNOSIS — E669 Obesity, unspecified: Secondary | ICD-10-CM | POA: Diagnosis not present

## 2022-08-11 DIAGNOSIS — Z6830 Body mass index (BMI) 30.0-30.9, adult: Secondary | ICD-10-CM | POA: Insufficient documentation

## 2022-08-11 DIAGNOSIS — E1122 Type 2 diabetes mellitus with diabetic chronic kidney disease: Secondary | ICD-10-CM | POA: Insufficient documentation

## 2022-08-11 NOTE — Progress Notes (Signed)
Medical Nutrition Therapy: Visit start time: 0910  end time: 1035  Assessment:   Referral Diagnosis: Type 2 diabetes, CKD stage 4, hyperlipidemia, obesity Other medical history/ diagnoses: sleep apnea, iron deficiency, CHF Psychosocial issues/ stress concerns: none  Medications, supplements: reconciled list in medical record   Preferred learning method:  No preference indicated   Current weight: 191.3lbs Height: 5'3" BMI: 33.89   Progress and evaluation:  Patient reports her weight fluctuates between 183 and 190lbs due to fluid  Daughter helps with care, cooking; they have made significant changes to reduce sodium intake, as well as reduction in potassium and phosphorus intake.  Patient does not eat red meats or poultry, but does eat fish, dairy, occasionally eggs. She eats plant based proteins also. She reports some decrease in appetite attributed to declining kidney function. Food allergies: none Special diet practices: none Recent lab results: 05/20/22 HbA1C 7.4%; 06/15/22 GFR 24, BUN 40, creatinine 2.24, sodium 139, potassium 3.5. calcium 10.4; 07/19/22 iron 88, ferritin 25 Patient seeks help with protecting kidney function, maintaining BG control, weight control    Dietary Intake:  Usual eating pattern includes 2 meals and 1 snacks per day. Dining out frequency: 0 meals per week. Who plans meals/ buys groceries? Self, daughter Who prepares meals? Self, daughter  Breakfast: herbal tea, difficult to eat food-- not a breakfast person Snack: none Lunch: 11:30-12:30 -- was eating salads cucumber, peppers with skinny girl balsamic vinaigrette; stir fry with frozen veg/ loves cabbage, carrots, peppers, onion; fruit berries, apples Snack: popcorn skinny pop, sunflower seeds unsalted Supper: by 6pm -- usu daughter cooks veggie pizza on pita; taco salad meatless, tortilla strips; brocc and cheese casserole; veg pie with low sodium veg and soup, tapioca starch Snack: none Beverages:  water, herbal tea am and night,   Physical activity: walking 20-30 minutes, 3-5 times a week   Intervention:   Nutrition Care Education:   Basic nutrition: basic food groups; appropriate nutrient balance; appropriate meal and snack schedule; general nutrition guidelines    Weight control: appropriate food portions and balanced meals; limiting sodium for minimizing fluid retention, regular exercise Diabetes:  appropriate meal and snack schedule; appropriate carb intake and balance, healthy carb choices Renal disease: appropriate protein intake, benefit of small portions at regular intervals; goals for sodium restriction; limiting potassium and natural phosphorus only when blood levels become elevated; limiting phosphorus additives in foods and beverages  Other intervention notes: Patient has been making appropriate changes with support from daughter; she is motivated to continue. Established additional goals to ensure adequate nutrition and maintain control of blood sugars as well as preserve renal function.  Scheduled follow up in 3 months per patient request   Nutritional Diagnosis:  [place]-2.1 Inpaired nutrition utilization and [place]-2.2 Altered nutrition-related laboratory As related to stage 4 CKD, diabetes.  As evidenced by low GFR, elevated HbA1C. -3.3 Overweight/obesity As related to history of excess calories &/or inadequate physical activity, fluid retention due to CKD.  As evidenced by patient with current BMI of 33.89.   Education Materials given:  Chronic Kidney Disease Nutrition Therapy (NCM) Kidney Disease Food Pyramid ([place]) Plate Planner with food lists, sample meal pattern Visit summary with goals/ instructions   Learner/ who was taught:  Patient  Family member: daughter   Level of understanding: Verbalizes/ demonstrates competency   Demonstrated degree of understanding via:   Teach back Learning barriers: None  Willingness to learn/ readiness for  change: Eager, change in progress   Monitoring and Evaluation:  Dietary intake,  exercise, renal function, BG control, and body weight      follow up:  11/10/22 at 9:00am

## 2022-08-11 NOTE — Patient Instructions (Addendum)
Eat a small portion of a protein food with each meal, every 3-5 hours during the day.  Each meal should include a protein food, a small-medium portion of starch, and a vegetable or fruit or both Great job limiting sodium, keep it up!

## 2022-08-12 ENCOUNTER — Inpatient Hospital Stay: Payer: Medicare Other

## 2022-08-12 VITALS — BP 117/63 | HR 67 | Temp 97.5°F | Resp 16

## 2022-08-12 DIAGNOSIS — D631 Anemia in chronic kidney disease: Secondary | ICD-10-CM | POA: Diagnosis not present

## 2022-08-12 DIAGNOSIS — N184 Chronic kidney disease, stage 4 (severe): Secondary | ICD-10-CM | POA: Diagnosis not present

## 2022-08-12 DIAGNOSIS — D509 Iron deficiency anemia, unspecified: Secondary | ICD-10-CM | POA: Diagnosis not present

## 2022-08-12 MED ORDER — SODIUM CHLORIDE 0.9 % IV SOLN
200.0000 mg | Freq: Once | INTRAVENOUS | Status: AC
  Administered 2022-08-12: 200 mg via INTRAVENOUS
  Filled 2022-08-12: qty 200

## 2022-08-12 MED ORDER — DIPHENHYDRAMINE HCL 50 MG/ML IJ SOLN
25.0000 mg | INTRAMUSCULAR | Status: DC | PRN
  Administered 2022-08-12: 25 mg via INTRAVENOUS
  Filled 2022-08-12: qty 1

## 2022-08-12 MED ORDER — SODIUM CHLORIDE 0.9 % IV SOLN
Freq: Once | INTRAVENOUS | Status: AC
  Filled 2022-08-12: qty 250

## 2022-08-13 ENCOUNTER — Ambulatory Visit (INDEPENDENT_AMBULATORY_CARE_PROVIDER_SITE_OTHER): Payer: Medicare Other | Admitting: Clinical

## 2022-08-13 DIAGNOSIS — F411 Generalized anxiety disorder: Secondary | ICD-10-CM | POA: Diagnosis not present

## 2022-08-13 DIAGNOSIS — F332 Major depressive disorder, recurrent severe without psychotic features: Secondary | ICD-10-CM

## 2022-08-13 NOTE — Progress Notes (Signed)
[facility] Counselor/Therapist Progress Note  Patient ID: Abigail Figueroa, MRN: [id number],    Date: 08/13/2022  Time Spent: 12:40pm - 1:35pm : 55 minutes  Treatment Type: Individual Therapy  Reported Symptoms: Patient stated, "I feel better" and stated, "I'm pretty good" in response to current mood.   Mental Status Exam: Appearance:  Well Groomed     Behavior: Appropriate  Motor: Normal  Speech/Language:  Clear and Coherent  Affect: Appropriate  Mood: normal  Thought process: normal  Thought content:   WNL  Sensory/Perceptual disturbances:   WNL  Orientation: oriented to person, place, and situation  Attention: Good  Concentration: Good  Memory: WNL  Fund of knowledge:  Good  Insight:   Good  Judgment:  Good  Impulse Control: Good   Risk Assessment: Danger to Self:  No Patient denied current suicidal ideation  Self-injurious Behavior: No Danger to Others: No Patient denied current homicidal ideation  Duty to Warn:no Physical Aggression / Violence:No  Access to Firearms a concern: No  Gang Involvement:No   Subjective: Patient stated, "its been going good" in response to events since last session. Patient reported she had an appointment with the psychiatrist, Dr. De Nurse at [facility] today. Patient reported the psychiatrist changed patient's medication from viibryd to Prozac.  Patient reported the psychiatrist recommended patient participate in an Intensive Outpatient Program (IOP) - [place] and patient inquired about the Intensive Outpatient Program. Patient reported the psychiatrist indicated patient could benefit from the group therapy component of an intensive outpatient program. Patient stated, "I feel better" in response to current mood and stated, "I'm pretty good".   Interventions:  Psycho education and supportive therapy.   Clinician conducted session via caregility video/audio from clinician's home office. Clinician initially  tried to assist patient with logging onto WebEx but patient was unable to log onto WebEx. Clinician then assisted patient with logging onto Caregility for today's session. Patient provided verbal consent to proceed with telehealth session and participated in session from patient's home. Reviewed events since last session. Discussed patient's recent appointment with the psychiatrist. Provided psycho education related to Intensive Outpatient Programs. Assessed patient's mood.    Diagnosis:  Major Depressive Disorder, recurrent, severe without psychotic features   Generalized anxiety disorder   Plan: Patient is to utilize [employer] Therapy, thought re-framing, relaxation techniques, mindfulness, effective communication, and coping strategies to decrease symptoms associated with Major Depressive Disorder and Generalized Anxiety Disorder. Frequency: bi-weekly  Modality: individual      Long-term goal:   Patient stated, "I want to see a change in the anxiety".      Reduce overall level, frequency, and intensity of the symptoms of anxiety and depression as evidenced by decreased sadness, feelings of anxiety, feeling "lost", lack of energy, difficulty falling and staying asleep, changes in concentration, psychomotor retardation, rapid heart rate, excessive worry, restlessness, feeling jittery, and feeling on edge from 5 to 6 days per week to 0 to 1 days per week per patient reported for at least 3 consecutive months.    Target Date: 02/06/23  Progress: progressing    Short-term goal:  Develop coping strategies to utilize in response to symptoms of depression/anxiety and stressors   Target Date: 08/07/22  Progress: progressing    Verbally express patient's thoughts and feelings to others and utilize effective communication strategies when expressing patient's thoughts/feelings   Target Date: 08/07/22  Progress: progressing    Verbalize an understanding of the relationship between symptoms  of anxiety and the impact  on patient's thought patterns and behaviors   Target Date: 08/07/22  Progress: progressing    Doree Barthel, LCSW

## 2022-08-13 NOTE — Progress Notes (Signed)
                Fawne Hughley, LCSW 

## 2022-08-23 DIAGNOSIS — R7989 Other specified abnormal findings of blood chemistry: Secondary | ICD-10-CM | POA: Diagnosis not present

## 2022-08-23 DIAGNOSIS — D631 Anemia in chronic kidney disease: Secondary | ICD-10-CM | POA: Diagnosis not present

## 2022-08-23 DIAGNOSIS — N184 Chronic kidney disease, stage 4 (severe): Secondary | ICD-10-CM | POA: Diagnosis not present

## 2022-08-23 DIAGNOSIS — E1159 Type 2 diabetes mellitus with other circulatory complications: Secondary | ICD-10-CM | POA: Diagnosis not present

## 2022-08-25 ENCOUNTER — Encounter: Payer: Self-pay | Admitting: Pharmacist

## 2022-08-25 NOTE — Progress Notes (Unsigned)
Cardiology Office Note:    Date:  08/26/2022   ID:  Abigail Figueroa, DOB Sep 20, 1960, MRN [id number]  PCP:  Abigail Ferguson, PA-C   [facility] Providers Cardiologist:  Abigail Guadalajara, MD Cardiology APP:  Abigail Figueroa, [place] { Referring MD: Abigail Lat, PA-C   Chief Complaint  Patient presents with   Follow-up    syncope    History of Present Illness:    Abigail Figueroa is a 62 y.o. female with a hx of hypertension, palpitations, hyperlipidemia, OSA on CPAP, seizure disorder, DM 2, and anemia.   Heart catheterization 08/2015 with normal coronaries and normal LV function.  This was in response to an abnormal stress test most likely due to body habitus.  Unfortunately she stopped using her CPAP machine because it started making a loud noise.  She was seen 01/09/21 with Abigail Fabian NP. She reported chest discomfort and arm pain at rest that sounded atypical. No ischemic evaluation planned. I saw her in follow up 01/07/22 with better compliance on CPAP but increased sodium in her diet leading to DOE, weight gain, and lower extremity edema.  I advised to reduce salt and increase lasix to 40 / 20 mg x 5 days, then 40 mg daily with an extra 20 mg three times weekly.   Unfortunately, BMP reveals worsening renal function, confirmed on recheck. I advised to reduce lasix to 40 mg daily and to stay hydrated. I referred to nephrology.  Echo was repeated and showed LVEF 60-65%, grade 1 DD, normal RV, and no significant valvular disease.   When I saw her back 04/12/22 she reported palpitations several times weekly but did not want to wear a heart monitor. Chest discomfort treated medically given reassuring heart cath in 2017 and renal function precluding CCTA, prior nuclear stress test with false positive. She was doing well when she saw Dr. Tresa Figueroa in Jan 2024. She saw Abigail Shields NP 07/2022. Imdur was stopped due to persistent headaches. BP now managed with increased dose of  amlodipine.  She was very concerned about sarcoid as she has a family history (mother, uncle).   Per NP Abigail Figueroa: We could actually do the PET scan (it will not negatively effect the kidneys as it does not use contrast eye) or an MRI. With PET we can see active inflammation but the MRI lets see better if there is or isn't sarcoid but harder to see which is new vs old. Both are reasonable choices with pros and cons.    In speaking with Dr. Jacques Figueroa (our cardiologist who specializes in imaging) she said we can start with either test. MRI is a long exam (probably at least 45 minutes) whereas PET is shorter test (probably 10 minutes) but there is a diet prep you have to do beforehand. With the pre-PET diet you have to do a high fat, zero carb diet for 24 hours prior to the test to get the most accurate results. Our imaging team can give more specific details on how to follow that diet one day before the CT.  She presents today with intermittent chest pain and SOB.  She reports ER visit 07/13/22 for chest pain and syncope - unwitnessed. Workup was largely negative, she was back to baseline and discharged without admission.   Possible syncopal episode yesterday when she fell back on the stairs - this was witnessed, but unclear if she had LOC. Nephrology recently told her she was hypotensive, is normotensive here today.   Past Medical History:  Diagnosis Date   Abnormal stress test    a. 10/2009: Normal myocardial perfusion imaging; b. 08/2015 MV: medium defect of mod severity in mid ant apical region w/ mild HK of the distal inf wall;  c. 08/2015 Cath: nl Cors, EF 60%.   Anemia    Angio-edema    Anxiety    Arthritis    Bell's palsy    Depression    Dermatitis 09/10/2014   Edema    Essential hypertension    Frequent urination    Frequent urination at night    Headache    migraines, gets botox injections   Heart palpitations 10/2011   Event monitor showing sinus tachycardia, PACs with couplets and  triplets.   Hx of echocardiogram    a. 10/2009 Echo: showed normal left ventricular function, mild left ventricular hypertrophy, and no significant valve abnormalities.   Hypercholesterolemia    Migraine    Morbid obesity    Neuromuscular disorder    OSA (obstructive sleep apnea)    has been on continuous positive airway pressure   Seizure disorder    Seizures    Type II diabetes mellitus    Urticaria     Past Surgical History:  Procedure Laterality Date   ABDOMINAL HYSTERECTOMY  1990   without BSO   BACK SURGERY  1900s 2001   x2 with "cage put in"   BILATERAL SALPINGOOPHORECTOMY  1990   CARDIAC CATHETERIZATION N/A 08/06/2015   Procedure: Left Heart Cath and Coronary Angiography;  Surgeon: Lennette Bihari, MD;  Location: [facility];  Service: Cardiovascular;  Laterality: N/A;   CARPAL TUNNEL RELEASE Bilateral    CHOLECYSTECTOMY  1980   COLONOSCOPY WITH PROPOFOL N/A 08/30/2017   Procedure: COLONOSCOPY WITH PROPOFOL;  Surgeon: Toney Reil, MD;  Location: [facility];  Service: Endoscopy;  Laterality: N/A;   DG THUMB LEFT HAND  01/24/2018   repair joint in left thumb   DORSAL COMPARTMENT RELEASE Left 10/25/2014   Procedure: LEFT FIRST  DORSAL COMPARTMENT RELEASE AND RADIAL TENOSYNOVECTOMY ;  Surgeon: Dominica Severin, MD;  Location: [facility];  Service: Orthopedics;  Laterality: Left;   HAND SURGERY     KNEE SURGERY Right 2012   meniscus tear   KNEE SURGERY  2012   LAMINECTOMY  1995   TUBAL LIGATION  1982   WRIST SURGERY Right     Current Medications: Current Meds  Medication Sig   amLODipine (NORVASC) 5 MG tablet Take 1 tablet (5 mg total) by mouth daily. PLEASE KEEP SCHEDULED APPOINTMENT   BOTOX 100 UNITS SOLR injection Inject into the muscle every 3 (three) months.    busPIRone (BUSPAR) 15 MG tablet TAKE 1 TABLET BY MOUTH TWICE A DAY   carbamazepine (TEGRETOL-XR) 400 MG 12 hr tablet TAKE 1 TABLET BY MOUTH TWICE A DAY   chlorproMAZINE  (THORAZINE) 25 MG tablet Take 50 mg by mouth 2 (two) times daily as needed.   chlorzoxazone (PARAFON) 500 MG tablet Take by mouth 4 (four) times daily as needed for muscle spasms.   clotrimazole-betamethasone (LOTRISONE) cream Apply 1 Application topically 2 (two) times daily.   diclofenac Sodium (VOLTAREN) 1 % GEL Apply topically as needed.   EMGALITY 120 MG/ML SOAJ Inject into the skin as needed.   EPINEPHrine 0.3 mg/0.3 mL IJ SOAJ injection Inject into the muscle.   ezetimibe (ZETIA) 10 MG tablet Take 1 tablet (10 mg total) by mouth daily.   fluocinolone (SYNALAR) 0.01 % external solution Apply topically 2 (  two) times daily.   FLUoxetine (PROZAC) 10 MG capsule Take 10 mg by mouth daily.   fluticasone (FLONASE) 50 MCG/ACT nasal spray Place 1-2 sprays into both nostrils as needed for allergies or rhinitis.   furosemide (LASIX) 40 MG tablet Take 1 tablet (40 mg total) by mouth daily.   lamoTRIgine (LAMICTAL) 100 MG tablet Take 100 mg by mouth 2 (two) times daily.   Lancets (ONETOUCH ULTRASOFT) lancets Test fasting each morning and 2 hours before supper. Retest if having hypoglycemic symptoms. (Patient taking differently: 1 each by Other route as needed for other. Test fasting each morning and 2 hours before supper. Retest if having hypoglycemic symptoms.)   metoprolol tartrate (LOPRESSOR) 100 MG tablet TAKE 1 TABLET BY MOUTH TWICE A DAY   ONETOUCH VERIO test strip TEST FASTING GLUCOSE DAILY AS DIRECTED   promethazine (PHENERGAN) 25 MG tablet Take 25 mg by mouth every 4 (four) hours as needed.   rosuvastatin (CRESTOR) 20 MG tablet TAKE 1 TABLET BY MOUTH EVERY DAY   Semaglutide,0.25 or 0.5MG /DOS, (OZEMPIC, 0.25 OR 0.5 MG/DOSE,) 2 MG/3ML SOPN Inject 0.25 mg into the skin once a week.   tretinoin (RETIN-A) 0.025 % cream Apply topically at bedtime.   triamcinolone ointment (KENALOG) 0.1 % Apply 1 Application topically 2 (two) times daily.   [DISCONTINUED] aspirin EC 81 MG tablet Take 81 mg by mouth  daily.     Allergies:   Morphine, Morphine and related, Oxycodone, Oxycodone hcl, Aimovig [erenumab-aooe], Augmentin [amoxicillin-pot clavulanate], and Morphine sulfate   Social History   Socioeconomic History   Marital status: Married    Spouse name: Not on file   Number of children: 2   Years of education: Not on file   Highest education level: 12th grade  Occupational History   Occupation: disabled  Tobacco Use   Smoking status: Former    Types: Cigarettes    Quit date: 01/03/1983    Years since quitting: 39.6   Smokeless tobacco: Never   Tobacco comments:    quit in 1984  Vaping Use   Vaping Use: Never used  Substance and Sexual Activity   Alcohol use: No    Alcohol/week: 0.0 standard drinks of alcohol   Drug use: No   Sexual activity: Never  Other Topics Concern   Not on file  Social History Narrative   ** Merged History Encounter **       She is a married mother of 2, grandmother 3. She tries to get exercise but is not doing any routine program.   She quit smoking over 25 years ago does not drink alcohol.   Social Determinants of Health   Financial Resource Strain: Low Risk  (11/26/2021)   Overall Financial Resource Strain (CARDIA)    Difficulty of Paying Living Expenses: Not hard at all  Food Insecurity: No Food Insecurity (11/26/2021)   Hunger Vital Sign    Worried About Running Out of Food in the Last Year: Never true    Ran Out of Food in the Last Year: Never true  Transportation Needs: Unmet Transportation Needs (11/26/2021)   PRAPARE - Transportation    Lack of Transportation (Medical): Yes    Lack of Transportation (Non-Medical): Yes  Physical Activity: Insufficiently Active (11/26/2021)   Exercise Vital Sign    Days of Exercise per Week: 7 days    Minutes of Exercise per Session: 20 min  Stress: Stress Concern Present (11/26/2021)   [employer] of Occupational Health - Occupational Stress Questionnaire  Feeling of Stress : To some extent   Social Connections: Moderately Isolated (11/26/2021)   Social Connection and Isolation Panel [NHANES]    Frequency of Communication with Friends and Family: More than three times a week    Frequency of Social Gatherings with Friends and Family: More than three times a week    Attends Religious Services: Never    Database administrator or Organizations: No    Attends Engineer, structural: Never    Marital Status: Married     Family History: The patient's family history includes Alzheimer's disease in her maternal grandmother; Dementia in her maternal grandmother; Diabetes in her maternal grandfather; Heart attack in her mother; Hypertension in her maternal grandfather; Sarcoidosis in her mother; Seizures in her mother. There is no history of Breast cancer, Allergic rhinitis, Asthma, Eczema, or Urticaria.  ROS:   Please see the history of present illness.     All other systems reviewed and are negative.  EKGs/Labs/Other Studies Reviewed:    The following studies were reviewed today:  Echo   EKG:  EKG is not ordered today.    Recent Labs: 03/29/2022: BNP 8.6 04/12/2022: Magnesium 2.5 06/15/2022: TSH 2.070 07/13/2022: ALT 80; BUN 40; Creatinine, Ser 2.24; Potassium 3.5; Sodium 139 07/19/2022: Hemoglobin 9.2; Platelets 201  Recent Lipid Panel    Component Value Date/Time   CHOL 132 11/05/2021 0924   CHOL 277 (H) 08/16/2014 0843   TRIG 100 11/05/2021 0924   TRIG 167 (H) 08/16/2014 0843   HDL 73 11/05/2021 0924   HDL 77 08/16/2014 0843   CHOLHDL 1.8 11/05/2021 0924   CHOLHDL 2.8 02/27/2019 0818   VLDL 22 02/27/2019 0818   VLDL 33 08/16/2014 0843   LDLCALC 41 11/05/2021 0924   LDLCALC 190 (H) 02/28/2017 1009   LDLCALC 167 (H) 08/16/2014 0843     Risk Assessment/Calculations:                Physical Exam:    VS:  BP 120/68 (BP Location: Right Arm, Patient Position: Sitting, Cuff Size: Normal)   Pulse 96   Ht 5\' 3"  (1.6 m)   Wt 189 lb 9.6 oz (86 kg)   LMP   (LMP Unknown)   BMI 33.59 kg/m     Wt Readings from Last 3 Encounters:  08/26/22 189 lb 9.6 oz (86 kg)  08/11/22 191 lb 4.8 oz (86.8 kg)  07/29/22 188 lb 6.4 oz (85.5 kg)     GEN:  Well nourished, well developed in no acute distress HEENT: Normal NECK: No JVD; No carotid bruits LYMPHATICS: No lymphadenopathy CARDIAC: RRR, no murmurs, rubs, gallops RESPIRATORY:  Clear to auscultation without rales, wheezing or rhonchi  ABDOMEN: Soft, non-tender, non-distended MUSCULOSKELETAL:  No edema; No deformity  SKIN: Warm and dry NEUROLOGIC:  Alert and oriented x 3 PSYCHIATRIC:  Normal affect   ASSESSMENT:    1. Syncope and collapse   2. CKD (chronic kidney disease) stage 4, GFR 15-29 ml/min   3. Obstructive apnea   4. Hyperlipidemia with target LDL less than 70    PLAN:    In order of problems listed above:  Questionable syncope Two episodes in the 6 weeks - will place a 30 day monitor.  She does not drive   CKD Now followed by nephrology Intermittent increasing in lasix resulted in worsening renal function, difficult to control volume status with dietary indiscretion   Hypertension - metoprolol, amlodipine, and furosemide   - did not tolerate isordil due to  persistent headaches - normotensive today   OSA on CPAP Now compliant with new mask    Hyperlipidemia with LDL goal < 70 11/05/2021: Cholesterol, Total 132; HDL 73; LDL Chol Calc (NIH) 41; Triglycerides 100 Clean coronaries by cath in 2017 Continue statin and zetia   Follow up in 6-8 weeks with me or Dr. Tresa Figueroa      Medication Adjustments/Labs and Tests Ordered: Current medicines are reviewed at length with the patient today.  Concerns regarding medicines are outlined above.  Orders Placed This Encounter  Procedures   Cardiac event monitor   No orders of the defined types were placed in this encounter.   Patient Instructions  Medication Instructions:  Your physician recommends that you continue on your  current medications as directed. Please refer to the Current Medication list given to you today.  *If you need a refill on your cardiac medications before your next appointment, please call your pharmacy*   Lab Work: None ordered  If you have labs (blood work) drawn today and your tests are completely normal, you will receive your results only by: MyChart Message (if you have MyChart) OR A paper copy in the mail If you have any lab test that is abnormal or we need to change your treatment, we will call you to review the results.   Testing/Procedures: [employer] Cardiac Event Monitor Instructions  Your physician has requested you wear your cardiac event monitor for _____ days, (1-30). [employer] may call or text to confirm a shipping address. The monitor will be sent to a land address via UPS. [employer] will not ship a monitor to a PO BOX. It typically takes 3-5 days to receive your monitor after it has been enrolled. [employer] will assist with USPS tracking if your package is delayed. The telephone number for [employer] is [phone]. Once you have received your monitor, please review the enclosed instructions. Instruction tutorials can also be viewed under help and settings on the enclosed cell phone. Your monitor has already been registered assigning a specific monitor serial # to you.  Billing and Self Pay Discount Information  [employer] has been provided the insurance information we had on file for you.  If your insurance has been updated, please call [employer] at [phone] to provide them with your updated insurance information.   [employer] offers a discounted Self Pay option for patients who have insurance that does not cover their cardiac event monitor or patients without insurance.  The discounted cost of a Self Pay Cardiac Event Monitor would be $225.00 , if the patient contacts [employer] at [phone] within 7 days of applying the monitor to make payment  arrangements.  If the patient does not contact [employer] within 7 days of applying the monitor, the cost of the cardiac event monitor will be $350.00.  Applying the monitor  Remove cell phone from case and turn it on. The cell phone works as IT consultant and needs to be within [employer] of you at all times. The cell phone will need to be charged on a daily basis. We recommend you plug the cell phone into the enclosed charger at your bedside table every night.  Monitor batteries: You will receive two monitor batteries labelled #1 and #2. These are your recorders. Plug battery #2 onto the second connection on the enclosed charger. Keep one battery on the charger at all times. This will keep the monitor battery deactivated. It will also keep it fully charged for when you need to switch your monitor batteries.  A small light will be blinking on the battery emblem when it is charging. The light on the battery emblem will remain on when the battery is fully charged.  Open package of a Monitor strip. Insert battery #1 into black hood on strip and gently squeeze monitor battery onto connection as indicated in instruction booklet. Set aside while preparing skin.  Choose location for your strip, vertical or horizontal, as indicated in the instruction booklet. Shave to remove all hair from location. There cannot be any lotions, oils, powders, or colognes on skin where monitor is to be applied. Wipe skin clean with enclosed Saline wipe. Dry skin completely.  Peel paper labeled #1 off the back of the Monitor strip exposing the adhesive. Place the monitor on the chest in the vertical or horizontal position shown in the instruction booklet. One arrow on the monitor strip must be pointing upward. Carefully remove paper labeled #2, attaching remainder of strip to your skin. Try not to create any folds or wrinkles in the strip as you apply it.  Firmly press and release the circle in the center of the  monitor battery. You will hear a small beep. This is turning the monitor battery on. The heart emblem on the monitor battery will light up every 5 seconds if the monitor battery in turned on and connected to the patient securely. Do not push and hold the circle down as this turns the monitor battery off. The cell phone will locate the monitor battery. A screen will appear on the cell phone checking the connection of your monitor strip. This may read poor connection initially but change to good connection within the next minute. Once your monitor accepts the connection you will hear a series of 3 beeps followed by a climbing crescendo of beeps. A screen will appear on the cell phone showing the two monitor strip placement options. Touch the picture that demonstrates where you applied the monitor strip.  Your monitor strip and battery are waterproof. You are able to shower, bathe, or swim with the monitor on. They just ask you do not submerge deeper than 3 feet underwater. We recommend removing the monitor if you are swimming in a lake, river, or ocean.  Your monitor battery will need to be switched to a fully charged monitor battery approximately once a week. The cell phone will alert you of an action which needs to be made.  On the cell phone, tap for details to reveal connection status, monitor battery status, and cell phone battery status. The green dots indicates your monitor is in good status. A red dot indicates there is something that needs your attention.  To record a symptom, click the circle on the monitor battery. In 30-60 seconds a list of symptoms will appear on the cell phone. Select your symptom and tap save. Your monitor will record a sustained or significant arrhythmia regardless of you clicking the button. Some patients do not feel the heart rhythm irregularities. [employer] will notify us of any serious or critical events.  Refer to instruction booklet for instructions on  switching batteries, changing strips, the Do not disturb or Pause features, or any additional questions.  Call [employer] at [phone], to confirm your monitor is transmitting and record your baseline. They will answer any questions you may have regarding the monitor instructions at that time.  Returning the monitor to [employer]  Place all equipment back into blue box. Peel off strip of paper to expose adhesive and close box securely. There  is a prepaid UPS shipping label on this box. Drop in a UPS drop box, or at a UPS facility like [employer]. You may also contact [employer] to arrange UPS to pick up monitor package at your home.    Follow-Up: At [facility], you and your health needs are our priority.  As part of our continuing mission to provide you with exceptional heart care, we have created designated Provider Care Teams.  These Care Teams include your primary Cardiologist (physician) and Advanced Practice Providers (APPs -  Physician Assistants and Nurse Practitioners) who all work together to provide you with the care you need, when you need it.  We recommend signing up for the patient portal called "MyChart".  Sign up information is provided on this After Visit Summary.  MyChart is used to connect with patients for Virtual Visits (Telemedicine).  Patients are able to view lab/test results, encounter notes, upcoming appointments, etc.  Non-urgent messages can be sent to your provider as well.   To learn more about what you can do with MyChart, go to [email or web address].    Your next appointment:   6 week(s)  Provider:   [name], MD     Other Instructions     Signed, Abigail Figueroa, [place]  08/26/2022 12:10 PM    [facility]

## 2022-08-26 ENCOUNTER — Ambulatory Visit: Payer: Medicare Other | Attending: Physician Assistant | Admitting: Physician Assistant

## 2022-08-26 ENCOUNTER — Encounter: Payer: Self-pay | Admitting: *Deleted

## 2022-08-26 ENCOUNTER — Encounter: Payer: Self-pay | Admitting: Physician Assistant

## 2022-08-26 VITALS — BP 120/68 | HR 96 | Ht 63.0 in | Wt 189.6 lb

## 2022-08-26 DIAGNOSIS — N184 Chronic kidney disease, stage 4 (severe): Secondary | ICD-10-CM | POA: Diagnosis not present

## 2022-08-26 DIAGNOSIS — G4733 Obstructive sleep apnea (adult) (pediatric): Secondary | ICD-10-CM | POA: Diagnosis not present

## 2022-08-26 DIAGNOSIS — E785 Hyperlipidemia, unspecified: Secondary | ICD-10-CM | POA: Diagnosis not present

## 2022-08-26 DIAGNOSIS — R55 Syncope and collapse: Secondary | ICD-10-CM | POA: Diagnosis not present

## 2022-08-26 NOTE — Progress Notes (Unsigned)
Patient enrolled for [employer]/ [employer] to ship a 30 day cardiac event monitor to her address on file.  Dr. Tresa Endo to read.

## 2022-08-26 NOTE — Patient Instructions (Addendum)
Medication Instructions:  Your physician recommends that you continue on your current medications as directed. Please refer to the Current Medication list given to you today.  *If you need a refill on your cardiac medications before your next appointment, please call your pharmacy*   Lab Work: None ordered  If you have labs (blood work) drawn today and your tests are completely normal, you will receive your results only by: MyChart Message (if you have MyChart) OR A paper copy in the mail If you have any lab test that is abnormal or we need to change your treatment, we will call you to review the results.   Testing/Procedures: [employer] Cardiac Event Monitor Instructions  Your physician has requested you wear your cardiac event monitor for _____ days, (1-30). [employer] may call or text to confirm a shipping address. The monitor will be sent to a land address via UPS. [employer] will not ship a monitor to a PO BOX. It typically takes 3-5 days to receive your monitor after it has been enrolled. [employer] will assist with USPS tracking if your package is delayed. The telephone number for [employer] is [phone]. Once you have received your monitor, please review the enclosed instructions. Instruction tutorials can also be viewed under help and settings on the enclosed cell phone. Your monitor has already been registered assigning a specific monitor serial # to you.  Billing and Self Pay Discount Information  [employer] has been provided the insurance information we had on file for you.  If your insurance has been updated, please call [employer] at [phone] to provide them with your updated insurance information.   [employer] offers a discounted Self Pay option for patients who have insurance that does not cover their cardiac event monitor or patients without insurance.  The discounted cost of a Self Pay Cardiac Event Monitor would be $225.00 , if the patient contacts [employer]  at [phone] within 7 days of applying the monitor to make payment arrangements.  If the patient does not contact [employer] within 7 days of applying the monitor, the cost of the cardiac event monitor will be $350.00.  Applying the monitor  Remove cell phone from case and turn it on. The cell phone works as IT consultant and needs to be within [employer] of you at all times. The cell phone will need to be charged on a daily basis. We recommend you plug the cell phone into the enclosed charger at your bedside table every night.  Monitor batteries: You will receive two monitor batteries labelled #1 and #2. These are your recorders. Plug battery #2 onto the second connection on the enclosed charger. Keep one battery on the charger at all times. This will keep the monitor battery deactivated. It will also keep it fully charged for when you need to switch your monitor batteries. A small light will be blinking on the battery emblem when it is charging. The light on the battery emblem will remain on when the battery is fully charged.  Open package of a Monitor strip. Insert battery #1 into black hood on strip and gently squeeze monitor battery onto connection as indicated in instruction booklet. Set aside while preparing skin.  Choose location for your strip, vertical or horizontal, as indicated in the instruction booklet. Shave to remove all hair from location. There cannot be any lotions, oils, powders, or colognes on skin where monitor is to be applied. Wipe skin clean with enclosed Saline wipe. Dry skin completely.  Peel paper labeled #1 off the  back of the Monitor strip exposing the adhesive. Place the monitor on the chest in the vertical or horizontal position shown in the instruction booklet. One arrow on the monitor strip must be pointing upward. Carefully remove paper labeled #2, attaching remainder of strip to your skin. Try not to create any folds or wrinkles in the strip as you apply  it.  Firmly press and release the circle in the center of the monitor battery. You will hear a small beep. This is turning the monitor battery on. The heart emblem on the monitor battery will light up every 5 seconds if the monitor battery in turned on and connected to the patient securely. Do not push and hold the circle down as this turns the monitor battery off. The cell phone will locate the monitor battery. A screen will appear on the cell phone checking the connection of your monitor strip. This may read poor connection initially but change to good connection within the next minute. Once your monitor accepts the connection you will hear a series of 3 beeps followed by a climbing crescendo of beeps. A screen will appear on the cell phone showing the two monitor strip placement options. Touch the picture that demonstrates where you applied the monitor strip.  Your monitor strip and battery are waterproof. You are able to shower, bathe, or swim with the monitor on. They just ask you do not submerge deeper than 3 feet underwater. We recommend removing the monitor if you are swimming in a lake, river, or ocean.  Your monitor battery will need to be switched to a fully charged monitor battery approximately once a week. The cell phone will alert you of an action which needs to be made.  On the cell phone, tap for details to reveal connection status, monitor battery status, and cell phone battery status. The green dots indicates your monitor is in good status. A red dot indicates there is something that needs your attention.  To record a symptom, click the circle on the monitor battery. In 30-60 seconds a list of symptoms will appear on the cell phone. Select your symptom and tap save. Your monitor will record a sustained or significant arrhythmia regardless of you clicking the button. Some patients do not feel the heart rhythm irregularities. [employer] will notify us of any serious or  critical events.  Refer to instruction booklet for instructions on switching batteries, changing strips, the Do not disturb or Pause features, or any additional questions.  Call [employer] at [phone], to confirm your monitor is transmitting and record your baseline. They will answer any questions you may have regarding the monitor instructions at that time.  Returning the monitor to [employer]  Place all equipment back into blue box. Peel off strip of paper to expose adhesive and close box securely. There is a prepaid UPS shipping label on this box. Drop in a UPS drop box, or at a UPS facility like [employer]. You may also contact [employer] to arrange UPS to pick up monitor package at your home.    Follow-Up: At [facility], you and your health needs are our priority.  As part of our continuing mission to provide you with exceptional heart care, we have created designated Provider Care Teams.  These Care Teams include your primary Cardiologist (physician) and Advanced Practice Providers (APPs -  Physician Assistants and Nurse Practitioners) who all work together to provide you with the care you need, when you need it.  We recommend signing up  for the patient portal called "MyChart".  Sign up information is provided on this After Visit Summary.  MyChart is used to connect with patients for Virtual Visits (Telemedicine).  Patients are able to view lab/test results, encounter notes, upcoming appointments, etc.  Non-urgent messages can be sent to your provider as well.   To learn more about what you can do with MyChart, go to [email or web address].    Your next appointment:   6-8 week(s)  Provider:   [name], MD    Or Bettina Gavia, PA-C  Other Instructions

## 2022-08-30 ENCOUNTER — Ambulatory Visit (INDEPENDENT_AMBULATORY_CARE_PROVIDER_SITE_OTHER): Payer: Medicare Other | Admitting: Clinical

## 2022-08-30 DIAGNOSIS — F332 Major depressive disorder, recurrent severe without psychotic features: Secondary | ICD-10-CM

## 2022-08-30 DIAGNOSIS — F411 Generalized anxiety disorder: Secondary | ICD-10-CM | POA: Diagnosis not present

## 2022-08-30 NOTE — Progress Notes (Signed)
[facility] Counselor/Therapist Progress Note  Patient ID: Abigail Figueroa, MRN: [id number],    Date: 08/30/2022  Time Spent: 12:35pm - 1:21pm : 46 minutes  Treatment Type: Individual Therapy  Reported Symptoms: Patient reported crying  Mental Status Exam: Appearance:  Neat     Behavior: Appropriate  Motor: Normal  Speech/Language:  Clear and Coherent  Affect: Tearful  Mood: depressed  Thought process: normal  Thought content:   WNL  Sensory/Perceptual disturbances:   WNL  Orientation: oriented to person, place, and situation  Attention: Good  Concentration: Good  Memory: WNL  Fund of knowledge:  Good  Insight:   Good  Judgment:  Good  Impulse Control: Good   Risk Assessment: Danger to Self:  No Patient denied current suicidal ideation  Self-injurious Behavior: No Danger to Others: No Patient denied current homicidal ideation  Duty to Warn:no Physical Aggression / Violence:No  Access to Firearms a concern: No  Gang Involvement:No   Subjective: Patient reported during a recent visit with her psychiatrist the psychiatrist increased patient's dosage of Prozac to 30 mg. Patient reported she has been crying and experiencing thoughts about her brother's affair and his wife. Patient reported she called the on call psychiatrist today and the on call psychiatrist decreased patient's dosage to 20 mg. Patient stated, "I've been sort of down" in response to patient's mood. Patient reported her insurance provider texted patient a list of marriage counselors and she plans to follow up in regards to the list. Patient stated, "I don't know why I feel so angry". Patient reported she has been thinking about the individual her brother was having an affair with and how the individual hurt patient. Patient reported feeling angry due to her brother's wife's behaviors.  Patient stated, "I just want to tell her exactly how she treated me and how she's done in the past to my brother".  Patient stated, "it would feel good to write her a letter" in response to her feelings regarding her sister in law. Patient reported intensive outpatient was not offered virtually through the provider her psychiatrist recommended. Patient reported she has a follow up appointment with her psychiatrist on Sep 03, 2022. Patient reported limited support.   Interventions: Cognitive Behavioral Therapy. Clinician conducted session via caregility video from clinician's home office. Patient provided verbal consent to proceed with telehealth session and participated in session from patient's home. Discussed recent changes in patient's mood and explored potential triggers for depressive symptoms and feelings of anger.  Discussed coping strategies for patient to utilize in response to patient's thoughts/feelings associated with her sister in law, such as, writing her sister in law a letter. Reviewed status of recommendation for marriage counseling. Discussed the status of intensive outpatient referral from psychiatrist. Clinician requested for homework patient begin writing a letter to her sister in law. Clinician provided information related to [facility] resources for additional support.   Collaboration of Care: Psychiatrist AEB patient agreed to sign a consent for her psychiatrist, Dr[facility] De Nurse at [facility]  Patient/Guardian was advised Release of Information must be obtained prior to any record release in order to collaborate their care with an outside provider. Patient/Guardian was advised if they have not already done so to contact [facility] to sign all necessary forms in order for [place] to release information regarding their care.   Consent: Patient/Guardian gives verbal consent for treatment and assignment of benefits for services provided during this visit. Patient/Guardian expressed understanding and agreed to proceed.  Diagnosis:  Major Depressive Disorder, recurrent, severe  without psychotic features   Generalized anxiety disorder   Plan: Patient is to utilize [employer] Therapy, thought re-framing, relaxation techniques, mindfulness, effective communication, and coping strategies to decrease symptoms associated with Major Depressive Disorder and Generalized Anxiety Disorder. Frequency: bi-weekly  Modality: individual      Long-term goal:   Patient stated, "I want to see a change in the anxiety".      Reduce overall level, frequency, and intensity of the symptoms of anxiety and depression as evidenced by decreased sadness, feelings of anxiety, feeling "lost", lack of energy, difficulty falling and staying asleep, changes in concentration, psychomotor retardation, rapid heart rate, excessive worry, restlessness, feeling jittery, and feeling on edge from 5 to 6 days per week to 0 to 1 days per week per patient reported for at least 3 consecutive months.    Target Date: 02/06/23  Progress: progressing    Short-term goal:  Develop coping strategies to utilize in response to symptoms of depression/anxiety and stressors   Target Date: 02/06/23  Progress: progressing    Verbally express patient's thoughts and feelings to others and utilize effective communication strategies when expressing patient's thoughts/feelings   Target Date: 02/06/23  Progress: progressing    Verbalize an understanding of the relationship between symptoms of anxiety and the impact on patient's thought patterns and behaviors   Target Date: 02/06/23  Progress: progressing       Doree Barthel, LCSW

## 2022-08-30 NOTE — Progress Notes (Signed)
                Julio Zappia, LCSW 

## 2022-09-02 DIAGNOSIS — R55 Syncope and collapse: Secondary | ICD-10-CM | POA: Diagnosis not present

## 2022-09-03 DIAGNOSIS — R55 Syncope and collapse: Secondary | ICD-10-CM | POA: Diagnosis not present

## 2022-09-10 ENCOUNTER — Telehealth: Payer: Self-pay | Admitting: Pharmacist

## 2022-09-10 NOTE — Progress Notes (Signed)
   09/10/2022  Patient ID: Abigail Figueroa, female   DOB: 15-Nov-1960, 62 y.o.   MRN: [id number]  This patient is appearing on the insurance-provided list for being at risk of failing the adherence measure for Statin Use in Persons with Diabetes (SUPD) medications this calendar year.   Was unable to reach patient via telephone today and have left HIPAA compliant voicemail asking patient to return my call.   Estelle Grumbles, PharmD, [facility] Clinical Pharmacist [facility] [phone]

## 2022-09-13 ENCOUNTER — Ambulatory Visit: Payer: Medicare Other | Admitting: Clinical

## 2022-09-14 ENCOUNTER — Other Ambulatory Visit: Payer: Self-pay | Admitting: Cardiovascular Disease

## 2022-09-14 ENCOUNTER — Ambulatory Visit: Payer: Medicare Other | Admitting: Physician Assistant

## 2022-09-15 ENCOUNTER — Telehealth: Payer: Self-pay

## 2022-09-15 ENCOUNTER — Telehealth (INDEPENDENT_AMBULATORY_CARE_PROVIDER_SITE_OTHER): Payer: Medicare Other | Admitting: Physician Assistant

## 2022-09-15 ENCOUNTER — Encounter: Payer: Self-pay | Admitting: Physician Assistant

## 2022-09-15 VITALS — Temp 97.5°F | Wt 186.0 lb

## 2022-09-15 DIAGNOSIS — J0110 Acute frontal sinusitis, unspecified: Secondary | ICD-10-CM

## 2022-09-15 MED ORDER — DOXYCYCLINE HYCLATE 100 MG PO TABS
100.0000 mg | ORAL_TABLET | Freq: Two times a day (BID) | ORAL | 0 refills | Status: AC
Start: 2022-09-15 — End: 2022-09-22

## 2022-09-15 NOTE — Progress Notes (Signed)
Abigail Figueroa,[name],acting as a Neurosurgeon for [employer], PA-C.,have documented all relevant documentation on the behalf of Abigail Ferguson, PA-C,as directed by  Abigail Ferguson, PA-C while in the presence of Abigail Ferguson, PA-C.   MyChart Video Visit    Virtual Visit via Video Note   This format is felt to be most appropriate for this patient at this time. Physical exam was limited by quality of the video and audio technology used for the visit.   Patient location: home Provider location: [facility]  Abigail Figueroa discussed the limitations of evaluation and management by telemedicine and the availability of in person appointments. The patient expressed understanding and agreed to proceed.  Patient: Abigail Figueroa   DOB: 1961-02-24   62 y.o. Female  MRN: [id number] Visit Date: 09/15/2022  Today's healthcare provider: [name], PA-C   Cc. Headache, sinus pain  Subjective    HPI  Upper respiratory symptoms She complains of congestion, headache described as aching, no  fever, non productive cough, and runny nose with fatigueness .with no fever, chills, night sweats or weight loss. Onset of symptoms was  5 days ago and staying constant.She is drinking moderate amounts of fluids.  Past history is significant for no history of pneumonia or bronchitis. Patient is non-smoker. Patient reports she has not done any recent traveling and has not had contact with anyone sick that she is aware of but she was recently at her grandsons graduation. Patient has not tested for covid but does have a test at home she could take. Patient has taken throat lozenges.  ---------------------------------------------------------------------------------------------------   Medications: Outpatient Medications Prior to Visit  Medication Sig   amLODipine (NORVASC) 5 MG tablet TAKE 1 TABLET (5 MG TOTAL) BY MOUTH DAILY. PLEASE KEEP SCHEDULED APPOINTMENT   BOTOX 100 UNITS SOLR injection Inject into the  muscle every 3 (three) months.    busPIRone (BUSPAR) 15 MG tablet TAKE 1 TABLET BY MOUTH TWICE A DAY   carbamazepine (TEGRETOL-XR) 400 MG 12 hr tablet TAKE 1 TABLET BY MOUTH TWICE A DAY   chlorproMAZINE (THORAZINE) 25 MG tablet Take 50 mg by mouth 2 (two) times daily as needed.   chlorzoxazone (PARAFON) 500 MG tablet Take by mouth 4 (four) times daily as needed for muscle spasms.   clotrimazole-betamethasone (LOTRISONE) cream Apply 1 Application topically 2 (two) times daily.   diclofenac Sodium (VOLTAREN) 1 % GEL Apply topically as needed.   EMGALITY 120 MG/ML SOAJ Inject into the skin as needed.   EPINEPHrine 0.3 mg/0.3 mL IJ SOAJ injection Inject into the muscle.   ezetimibe (ZETIA) 10 MG tablet Take 1 tablet (10 mg total) by mouth daily.   fluocinolone (SYNALAR) 0.01 % external solution Apply topically 2 (two) times daily.   FLUoxetine (PROZAC) 10 MG capsule Take 10 mg by mouth daily.   fluticasone (FLONASE) 50 MCG/ACT nasal spray Place 1-2 sprays into both nostrils as needed for allergies or rhinitis.   furosemide (LASIX) 40 MG tablet Take 1 tablet (40 mg total) by mouth daily.   lamoTRIgine (LAMICTAL) 100 MG tablet Take 100 mg by mouth 2 (two) times daily.   Lancets (ONETOUCH ULTRASOFT) lancets Test fasting each morning and 2 hours before supper. Retest if having hypoglycemic symptoms. (Patient taking differently: 1 each by Other route as needed for other. Test fasting each morning and 2 hours before supper. Retest if having hypoglycemic symptoms.)   metoprolol tartrate (LOPRESSOR) 100 MG tablet TAKE 1 TABLET BY MOUTH TWICE A DAY   ONETOUCH  VERIO test strip TEST FASTING GLUCOSE DAILY AS DIRECTED   promethazine (PHENERGAN) 25 MG tablet Take 25 mg by mouth every 4 (four) hours as needed.   rosuvastatin (CRESTOR) 20 MG tablet TAKE 1 TABLET BY MOUTH EVERY DAY   Semaglutide,0.25 or 0.5MG /DOS, (OZEMPIC, 0.25 OR 0.5 MG/DOSE,) 2 MG/3ML SOPN Inject 0.25 mg into the skin once a week.   tretinoin  (RETIN-A) 0.025 % cream Apply topically at bedtime.   triamcinolone ointment (KENALOG) 0.1 % Apply 1 Application topically 2 (two) times daily.   Vilazodone HCl (VIIBRYD) 40 MG TABS Take 1 tablet (40 mg total) by mouth daily.   Azelastine HCl 0.15 % SOLN PLACE 2 SPRAYS INTO BOTH NOSTRILS 2 (TWO) TIMES DAILY (Patient taking differently: Place 2 sprays into both nostrils 2 (two) times daily. Taking as needed)   loratadine (CLARITIN) 10 MG tablet Take 1 tablet (10 mg total) by mouth in the morning and at bedtime.   No facility-administered medications prior to visit.    Review of Systems  Constitutional:  Positive for fatigue. Negative for fever.  HENT:  Positive for congestion, postnasal drip, rhinorrhea, sinus pressure, sinus pain and sore throat.   Respiratory:  Positive for cough. Negative for shortness of breath.   Cardiovascular:  Negative for chest pain and leg swelling.  Gastrointestinal:  Negative for abdominal pain.  Neurological:  Positive for headaches. Negative for dizziness.      Objective      Physical Exam Constitutional:      Appearance: Normal appearance.  Neurological:     Mental Status: She is oriented to person, place, and time.  Psychiatric:        Mood and Affect: Mood normal.        Behavior: Behavior normal.        Assessment & Plan     1. Acute non-recurrent frontal sinusitis Pt tested negative on home covid test Continue OTC therapy, fluids, antihistamines Rx doxy x 7 days   - doxycycline (VIBRA-TABS) 100 MG tablet; Take 1 tablet (100 mg total) by mouth 2 (two) times daily for 7 days.  Dispense: 14 tablet; Refill: 0   Return if symptoms worsen or fail to improve.     Abigail Figueroa discussed the assessment and treatment plan with the patient. The patient was provided an opportunity to ask questions and all were answered. The patient agreed with the plan and demonstrated an understanding of the instructions.   The patient was advised to call back or seek an  in-person evaluation if the symptoms worsen or if the condition fails to improve as anticipated.  Abigail Figueroa provided 7 minutes of non-face-to-face time during this encounter.  Abigail Figueroa, Abigail Ferguson, PA-C have reviewed all documentation for this visit. The documentation on  09/15/22   for the exam, diagnosis, procedures, and orders are all accurate and complete.  Abigail Ferguson, PA-C [facility] [address] [place], [place], [address] Office: [phone] Fax: [phone]   [facility]

## 2022-09-15 NOTE — Telephone Encounter (Signed)
Call disconnected. CRM created. Ok for [facility] to advise is patient returns call

## 2022-09-15 NOTE — Telephone Encounter (Signed)
Opened in error

## 2022-09-15 NOTE — Telephone Encounter (Signed)
Copied from CRM [id number]. Topic: General - Other >> Sep 15, 2022  3:56 PM [place] B wrote: Reason for CRM: pt called in to let Dr Ok Edwards know her she tested negative for covid

## 2022-09-16 NOTE — Telephone Encounter (Signed)
Patient advised.

## 2022-09-20 ENCOUNTER — Encounter: Payer: Medicare Other | Admitting: Clinical

## 2022-09-20 MED FILL — Iron Sucrose Inj 20 MG/ML (Fe Equiv): INTRAVENOUS | Qty: 10 | Status: AC

## 2022-09-20 NOTE — Progress Notes (Unsigned)
                Doree Barthel, LCSW This encounter was created in error - please disregard. This encounter was created in error - please disregard. This encounter was created in error - please disregard. This encounter was created in error - please disregard.This encounter was created in error - please disregard.

## 2022-09-21 ENCOUNTER — Encounter: Payer: Self-pay | Admitting: Oncology

## 2022-09-21 ENCOUNTER — Inpatient Hospital Stay (HOSPITAL_BASED_OUTPATIENT_CLINIC_OR_DEPARTMENT_OTHER): Payer: Medicare Other | Admitting: Oncology

## 2022-09-21 ENCOUNTER — Inpatient Hospital Stay: Payer: Medicare Other

## 2022-09-21 ENCOUNTER — Inpatient Hospital Stay: Payer: Medicare Other | Attending: Oncology

## 2022-09-21 VITALS — BP 127/70 | HR 62 | Temp 97.3°F | Resp 18 | Wt 185.2 lb

## 2022-09-21 DIAGNOSIS — Z87891 Personal history of nicotine dependence: Secondary | ICD-10-CM | POA: Insufficient documentation

## 2022-09-21 DIAGNOSIS — D631 Anemia in chronic kidney disease: Secondary | ICD-10-CM | POA: Diagnosis not present

## 2022-09-21 DIAGNOSIS — N184 Chronic kidney disease, stage 4 (severe): Secondary | ICD-10-CM

## 2022-09-21 LAB — CBC WITH DIFFERENTIAL/PLATELET
Abs Immature Granulocytes: 0.04 10*3/uL (ref 0.00–0.07)
Basophils Absolute: 0.1 10*3/uL (ref 0.0–0.1)
Basophils Relative: 1 %
Eosinophils Absolute: 0.2 10*3/uL (ref 0.0–0.5)
Eosinophils Relative: 4 %
HCT: 29.6 % — ABNORMAL LOW (ref 36.0–46.0)
Hemoglobin: 9.6 g/dL — ABNORMAL LOW (ref 12.0–15.0)
Immature Granulocytes: 1 %
Lymphocytes Relative: 46 %
Lymphs Abs: 2.3 10*3/uL (ref 0.7–4.0)
MCH: 28.3 pg (ref 26.0–34.0)
MCHC: 32.4 g/dL (ref 30.0–36.0)
MCV: 87.3 fL (ref 80.0–100.0)
Monocytes Absolute: 0.3 10*3/uL (ref 0.1–1.0)
Monocytes Relative: 6 %
Neutro Abs: 2.1 10*3/uL (ref 1.7–7.7)
Neutrophils Relative %: 42 %
Platelets: 214 10*3/uL (ref 150–400)
RBC: 3.39 MIL/uL — ABNORMAL LOW (ref 3.87–5.11)
RDW: 13.8 % (ref 11.5–15.5)
WBC: 4.9 10*3/uL (ref 4.0–10.5)
nRBC: 0 % (ref 0.0–0.2)

## 2022-09-21 LAB — IRON AND TIBC
Iron: 104 ug/dL (ref 28–170)
Saturation Ratios: 37 % — ABNORMAL HIGH (ref 10.4–31.8)
TIBC: 283 ug/dL (ref 250–450)
UIBC: 179 ug/dL

## 2022-09-21 LAB — RETIC PANEL
Immature Retic Fract: 6.1 % (ref 2.3–15.9)
RBC.: 3.36 MIL/uL — ABNORMAL LOW (ref 3.87–5.11)
Retic Count, Absolute: 34.9 10*3/uL (ref 19.0–186.0)
Retic Ct Pct: 1 % (ref 0.4–3.1)
Reticulocyte Hemoglobin: 31.6 pg (ref >27.9)

## 2022-09-21 LAB — FERRITIN: Ferritin: 241 ng/mL (ref 11–307)

## 2022-09-21 NOTE — Progress Notes (Signed)
Hematology/Oncology Consult note [phone] [phone]    REFERRING PROVIDER: Debera Lat, PA-C  CHIEF COMPLAINTS/REASON FOR VISIT:  Anemia  ASSESSMENT & PLAN:   Anemia in stage 4 chronic kidney disease (HCC) Status post IV Venofer treatments.  She tolerated well. Labs are reviewed and discussed with patient. Lab Results  Component Value Date   HGB 9.6 (L) 09/21/2022   TIBC 283 09/21/2022   IRONPCTSAT 37 (H) 09/21/2022   FERRITIN 241 09/21/2022   Ferritin is 241, at goal.  Hemoglobin 9.6, less than 10. Recommend erythropoietin replacement therapy.  Will start patient on Retacrit 20,000 units every 4 weeks, with further titration of the dose according to her treatment response. Rationale and potential side effects of Retacrit were reviewed with patient, she agrees with the treatment..     CKD (chronic kidney disease) stage 4, GFR 15-29 ml/min (HCC) Encourage oral hydration and avoid nephrotoxins.  Previous workup negative for M protein and only slightly increased light chain ratio.  Nonspecific.   Negative M protein on 24-hour urine protein electrophoresis.  Orders Placed This Encounter  Procedures   CBC with Differential (Cancer Center Only)    Standing Status:   Future    Standing Expiration Date:   09/21/2023   Iron and TIBC    Standing Status:   Future    Standing Expiration Date:   09/21/2023   Ferritin    Standing Status:   Future    Standing Expiration Date:   09/21/2023   Retic Panel    Standing Status:   Future    Standing Expiration Date:   09/21/2023   Follow-up  H&H monthly +/- Retacrit x 2 3 months, labs prior to MD +/- Retacrit +/- Venofer. All questions were answered. The patient knows to call the clinic with any problems, questions or concerns.  Rickard Patience, MD, PhD [facility] Hematology Oncology 09/21/2022     HISTORY OF PRESENTING ILLNESS:  Abigail Figueroa is a  62 y.o.  female with PMH listed below who was referred  to me for anemia Reviewed patient's recent labs that was done.  Patient has progressive worsening of anemia. 07/13/2022, hemoglobin is 8.2, MCV 88.9, normal white count and platelet counts. Patient reports severe fatigue, mild shortness of breath with exertion. She denies recent chest pain on exertion,  , pre-syncopal episodes, or palpitations She had not noticed any recent bleeding such as epistaxis, hematuria.  Patient has intermittent rectal bleeding.  Constipation.  She has upcoming GI appointment for further evaluation.   Patient has chronic kidney disease, stage IV.  She follows up with nephrology.  She has had negative protein electrophoresis.  Light chain ratio was mildly increased which is nonspecific.Marland Kitchen  She is on aspirin 81 mg.   INTERVAL HISTORY Abigail SHEARON is a 62 y.o. female who has above history reviewed by me today presents for follow up visit for anemia due to CKD.  She tolerated Venofer treatments.  Chronic fatigue unchanged.    MEDICAL HISTORY:  Past Medical History:  Diagnosis Date   Abnormal stress test    a. 10/2009: Normal myocardial perfusion imaging; b. 08/2015 MV: medium defect of mod severity in mid ant apical region w/ mild HK of the distal inf wall;  c. 08/2015 Cath: nl Cors, EF 60%.   Anemia    Angio-edema    Anxiety    Arthritis    Bell's palsy    Depression    Dermatitis 09/10/2014   Edema    Essential  hypertension    Frequent urination    Frequent urination at night    Headache    migraines, gets botox injections   Heart palpitations 10/2011   Event monitor showing sinus tachycardia, PACs with couplets and triplets.   Hx of echocardiogram    a. 10/2009 Echo: showed normal left ventricular function, mild left ventricular hypertrophy, and no significant valve abnormalities.   Hypercholesterolemia    Migraine    Morbid obesity (HCC)    Neuromuscular disorder (HCC)    OSA (obstructive sleep apnea)    has been on continuous positive airway  pressure   Seizure disorder (HCC)    Seizures (HCC)    Type II diabetes mellitus (HCC)    Urticaria     SURGICAL HISTORY: Past Surgical History:  Procedure Laterality Date   ABDOMINAL HYSTERECTOMY  1990   without BSO   BACK SURGERY  1900s 2001   x2 with "cage put in"   BILATERAL SALPINGOOPHORECTOMY  1990   CARDIAC CATHETERIZATION N/A 08/06/2015   Procedure: Left Heart Cath and Coronary Angiography;  Surgeon: Lennette Bihari, MD;  Location: [facility];  Service: Cardiovascular;  Laterality: N/A;   CARPAL TUNNEL RELEASE Bilateral    CHOLECYSTECTOMY  1980   COLONOSCOPY WITH PROPOFOL N/A 08/30/2017   Procedure: COLONOSCOPY WITH PROPOFOL;  Surgeon: Toney Reil, MD;  Location: [facility];  Service: Endoscopy;  Laterality: N/A;   DG THUMB LEFT HAND  01/24/2018   repair joint in left thumb   DORSAL COMPARTMENT RELEASE Left 10/25/2014   Procedure: LEFT FIRST  DORSAL COMPARTMENT RELEASE AND RADIAL TENOSYNOVECTOMY ;  Surgeon: Dominica Severin, MD;  Location: [facility];  Service: Orthopedics;  Laterality: Left;   HAND SURGERY     KNEE SURGERY Right 2012   meniscus tear   KNEE SURGERY  2012   LAMINECTOMY  1995   TUBAL LIGATION  1982   WRIST SURGERY Right     SOCIAL HISTORY: Social History   Socioeconomic History   Marital status: Married    Spouse name: Not on file   Number of children: 2   Years of education: Not on file   Highest education level: 12th grade  Occupational History   Occupation: disabled  Tobacco Use   Smoking status: Former    Types: Cigarettes    Quit date: 01/03/1983    Years since quitting: 39.7   Smokeless tobacco: Never   Tobacco comments:    quit in 1984  Vaping Use   Vaping Use: Never used  Substance and Sexual Activity   Alcohol use: No    Alcohol/week: 0.0 standard drinks of alcohol   Drug use: No   Sexual activity: Never  Other Topics Concern   Not on file  Social History Narrative   ** Merged History Encounter **        She is a married mother of 2, grandmother 3. She tries to get exercise but is not doing any routine program.   She quit smoking over 25 years ago does not drink alcohol.   Social Determinants of Health   Financial Resource Strain: Low Risk  (11/26/2021)   Overall Financial Resource Strain (CARDIA)    Difficulty of Paying Living Expenses: Not hard at all  Food Insecurity: No Food Insecurity (11/26/2021)   Hunger Vital Sign    Worried About Running Out of Food in the Last Year: Never true    Ran Out of Food in the Last Year: Never true  Transportation Needs: Unmet Transportation Needs (11/26/2021)   PRAPARE - Transportation    Lack of Transportation (Medical): Yes    Lack of Transportation (Non-Medical): Yes  Physical Activity: Insufficiently Active (11/26/2021)   Exercise Vital Sign    Days of Exercise per Week: 7 days    Minutes of Exercise per Session: 20 min  Stress: Stress Concern Present (11/26/2021)   [employer] of Occupational Health - Occupational Stress Questionnaire    Feeling of Stress : To some extent  Social Connections: Moderately Isolated (11/26/2021)   Social Connection and Isolation Panel [NHANES]    Frequency of Communication with Friends and Family: More than three times a week    Frequency of Social Gatherings with Friends and Family: More than three times a week    Attends Religious Services: Never    Database administrator or Organizations: No    Attends Banker Meetings: Never    Marital Status: Married  Catering manager Violence: Not At Risk (11/26/2021)   Humiliation, Afraid, Rape, and Kick questionnaire    Fear of Current or Ex-Partner: No    Emotionally Abused: No    Physically Abused: No    Sexually Abused: No    FAMILY HISTORY: Family History  Problem Relation Age of Onset   Sarcoidosis Mother    Seizures Mother    Heart attack Mother    Alzheimer's disease Maternal Grandmother    Dementia Maternal Grandmother     Hypertension Maternal Grandfather    Diabetes Maternal Grandfather    Breast cancer Neg Hx    Allergic rhinitis Neg Hx    Asthma Neg Hx    Eczema Neg Hx    Urticaria Neg Hx     ALLERGIES:  is allergic to morphine, morphine and codeine, oxycodone, oxycodone hcl, aimovig [erenumab-aooe], augmentin [amoxicillin-pot clavulanate], and morphine sulfate.  MEDICATIONS:  Current Outpatient Medications  Medication Sig Dispense Refill   BOTOX 100 UNITS SOLR injection Inject into the muscle every 3 (three) months.      busPIRone (BUSPAR) 15 MG tablet TAKE 1 TABLET BY MOUTH TWICE A DAY 180 tablet 1   carbamazepine (TEGRETOL-XR) 400 MG 12 hr tablet TAKE 1 TABLET BY MOUTH TWICE A DAY 180 tablet 2   chlorproMAZINE (THORAZINE) 25 MG tablet Take 50 mg by mouth 2 (two) times daily as needed.     chlorzoxazone (PARAFON) 500 MG tablet Take by mouth 4 (four) times daily as needed for muscle spasms.     clotrimazole-betamethasone (LOTRISONE) cream Apply 1 Application topically 2 (two) times daily. 45 g 3   diclofenac Sodium (VOLTAREN) 1 % GEL Apply topically as needed.     doxycycline (VIBRA-TABS) 100 MG tablet Take 1 tablet (100 mg total) by mouth 2 (two) times daily for 7 days. 14 tablet 0   EMGALITY 120 MG/ML SOAJ Inject into the skin as needed.     EPINEPHrine 0.3 mg/0.3 mL IJ SOAJ injection Inject into the muscle.     ezetimibe (ZETIA) 10 MG tablet Take 1 tablet (10 mg total) by mouth daily. 90 tablet 3   fluocinolone (SYNALAR) 0.01 % external solution Apply topically 2 (two) times daily.     FLUoxetine (PROZAC) 10 MG capsule Take 10 mg by mouth daily.     fluticasone (FLONASE) 50 MCG/ACT nasal spray Place 1-2 sprays into both nostrils as needed for allergies or rhinitis.     furosemide (LASIX) 40 MG tablet Take 1 tablet (40 mg total) by mouth daily. 90 tablet 3  lamoTRIgine (LAMICTAL) 100 MG tablet Take 100 mg by mouth 2 (two) times daily.     Lancets (ONETOUCH ULTRASOFT) lancets Test fasting each  morning and 2 hours before supper. Retest if having hypoglycemic symptoms. (Patient taking differently: 1 each by Other route as needed for other. Test fasting each morning and 2 hours before supper. Retest if having hypoglycemic symptoms.) 100 each 12   loratadine (CLARITIN) 10 MG tablet Take 1 tablet (10 mg total) by mouth in the morning and at bedtime. 60 tablet 11   metoprolol tartrate (LOPRESSOR) 100 MG tablet TAKE 1 TABLET BY MOUTH TWICE A DAY 180 tablet 2   ONETOUCH VERIO test strip TEST FASTING GLUCOSE DAILY AS DIRECTED 100 strip 3   promethazine (PHENERGAN) 25 MG tablet Take 25 mg by mouth every 4 (four) hours as needed.     rosuvastatin (CRESTOR) 20 MG tablet TAKE 1 TABLET BY MOUTH EVERY DAY 90 tablet 1   Semaglutide,0.25 or 0.5MG /DOS, (OZEMPIC, 0.25 OR 0.5 MG/DOSE,) 2 MG/3ML SOPN Inject 0.25 mg into the skin once a week. 3 mL 0   tretinoin (RETIN-A) 0.025 % cream Apply topically at bedtime.     triamcinolone ointment (KENALOG) 0.1 % Apply 1 Application topically 2 (two) times daily.     Vilazodone HCl (VIIBRYD) 40 MG TABS Take 1 tablet (40 mg total) by mouth daily. 90 tablet 0   amLODipine (NORVASC) 5 MG tablet TAKE 1 TABLET (5 MG TOTAL) BY MOUTH DAILY. PLEASE KEEP SCHEDULED APPOINTMENT 90 tablet 1   Azelastine HCl 0.15 % SOLN PLACE 2 SPRAYS INTO BOTH NOSTRILS 2 (TWO) TIMES DAILY (Patient taking differently: Place 2 sprays into both nostrils 2 (two) times daily. Taking as needed) 30 mL 11   No current facility-administered medications for this visit.    Review of Systems  Constitutional:  Positive for fatigue. Negative for appetite change, chills and fever.  HENT:   Negative for hearing loss and voice change.   Eyes:  Negative for eye problems.  Respiratory:  Negative for chest tightness and cough.   Cardiovascular:  Negative for chest pain.  Gastrointestinal:  Negative for abdominal distention, abdominal pain and blood in stool.  Endocrine: Negative for hot flashes.   Genitourinary:  Negative for difficulty urinating and frequency.   Musculoskeletal:  Negative for arthralgias.  Skin:  Negative for itching and rash.  Neurological:  Negative for extremity weakness.  Hematological:  Negative for adenopathy.  Psychiatric/Behavioral:  Negative for confusion.     PHYSICAL EXAMINATION: ECOG PERFORMANCE STATUS: 1 - Symptomatic but completely ambulatory Vitals:   09/21/22 1421  BP: 127/70  Pulse: 62  Resp: 18  Temp: (!) 97.3 F (36.3 C)  SpO2: 99%   Filed Weights   09/21/22 1421  Weight: 185 lb 3.2 oz (84 kg)    Physical Exam Constitutional:      General: She is not in acute distress. HENT:     Head: Normocephalic and atraumatic.  Eyes:     General: No scleral icterus. Cardiovascular:     Rate and Rhythm: Normal rate and regular rhythm.     Heart sounds: Normal heart sounds.  Pulmonary:     Effort: Pulmonary effort is normal. No respiratory distress.     Breath sounds: No wheezing.  Abdominal:     General: Bowel sounds are normal. There is no distension.     Palpations: Abdomen is soft.  Musculoskeletal:        General: No deformity. Normal range of motion.  Cervical back: Normal range of motion and neck supple.  Skin:    General: Skin is warm and dry.     Findings: No erythema or rash.  Neurological:     Mental Status: She is alert and oriented to person, place, and time. Mental status is at baseline.     Cranial Nerves: No cranial nerve deficit.     Coordination: Coordination normal.  Psychiatric:        Mood and Affect: Mood normal.      LABORATORY DATA:  I have reviewed the data as listed    Latest Ref Rng & Units 09/21/2022    1:54 PM 07/19/2022   12:13 PM 07/13/2022   12:29 AM  CBC  WBC 4.0 - 10.5 K/uL 4.9  4.5  4.6   Hemoglobin 12.0 - 15.0 g/dL 9.6  9.2  8.2   Hematocrit 36.0 - 46.0 % 29.6  28.9  26.3   Platelets 150 - 400 K/uL 214  201  193       Latest Ref Rng & Units 07/13/2022   12:29 AM 06/15/2022    2:38  PM 04/12/2022   12:18 PM  CMP  Glucose 70 - 99 mg/dL 604  540  981   BUN 8 - 23 mg/dL 40  27  24   Creatinine 0.44 - 1.00 mg/dL 1.91  4.78  2.95   Sodium 135 - 145 mmol/L 139  143  140   Potassium 3.5 - 5.1 mmol/L 3.5  4.4  4.2   Chloride 98 - 111 mmol/L 104  105  104   CO2 22 - 32 mmol/L 26  22  22    Calcium 8.9 - 10.3 mg/dL 9.0  62.1  30.8   Total Protein 6.5 - 8.1 g/dL 7.3  7.5  7.8   Total Bilirubin 0.3 - 1.2 mg/dL 0.4  <6.5    Alkaline Phos 38 - 126 U/L 86  92    AST 15 - 41 U/L 218  29    ALT 0 - 44 U/L 80  34        Component Value Date/Time   IRON 104 09/21/2022 1354   TIBC 283 09/21/2022 1354   FERRITIN 241 09/21/2022 1354   IRONPCTSAT 37 (H) 09/21/2022 1354     RADIOGRAPHIC STUDIES: I have personally reviewed the radiological images as listed and agreed with the findings in the report. No results found.

## 2022-09-21 NOTE — Assessment & Plan Note (Signed)
Status post IV Venofer treatments.  She tolerated well. Labs are reviewed and discussed with patient. Lab Results  Component Value Date   HGB 9.6 (L) 09/21/2022   TIBC 283 09/21/2022   IRONPCTSAT 37 (H) 09/21/2022   FERRITIN 241 09/21/2022   Ferritin is 241, at goal.  Hemoglobin 9.6, less than 10. Recommend erythropoietin replacement therapy.  Will start patient on Retacrit 20,000 units every 4 weeks, with further titration of the dose according to her treatment response. Rationale and potential side effects of Retacrit were reviewed with patient, she agrees with the treatment.Marland Kitchen

## 2022-09-21 NOTE — Assessment & Plan Note (Signed)
Encourage oral hydration and avoid nephrotoxins.  Previous workup negative for M protein and only slightly increased light chain ratio.  Nonspecific.   Negative M protein on 24-hour urine protein electrophoresis.

## 2022-09-22 ENCOUNTER — Other Ambulatory Visit: Payer: Self-pay

## 2022-09-22 ENCOUNTER — Telehealth: Payer: Self-pay

## 2022-09-22 NOTE — Telephone Encounter (Addendum)
Called patient and informed her that her iron level is at goal and that Dr. Cathie Hoops recommends retacrit injection monthly. I informed her that Dr. Cathie Hoops would like for her to get her first retacrit injection this week and lab/retacrit at 4 weeks and 8 weeks and keep 3 month follow-up that is already scheduled.

## 2022-09-22 NOTE — Telephone Encounter (Signed)
Scheduling will you schedule and notify patient of appointment dates and times. Patient said that tomorrow morning (5/23) works good for her to do retacrit injection if there is an available time slot.

## 2022-09-22 NOTE — Telephone Encounter (Signed)
-----   Message from Rickard Patience, MD sent at 09/21/2022  6:30 PM EDT ----- Please let patient know that iron level is at goal.  I recommend Retacrit treatment monthly. Please arrange her to get Retacrit injection this week 4 weeks lab +/- Retacrit 8 weeks lab +/- Retacrit, labs are ordered. Keep 3 months appointment as scheduled.  Thank you

## 2022-09-23 ENCOUNTER — Inpatient Hospital Stay: Payer: Medicare Other

## 2022-09-23 VITALS — BP 116/76 | HR 70

## 2022-09-23 DIAGNOSIS — D631 Anemia in chronic kidney disease: Secondary | ICD-10-CM | POA: Diagnosis not present

## 2022-09-23 DIAGNOSIS — I872 Venous insufficiency (chronic) (peripheral): Secondary | ICD-10-CM | POA: Diagnosis not present

## 2022-09-23 DIAGNOSIS — L299 Pruritus, unspecified: Secondary | ICD-10-CM | POA: Diagnosis not present

## 2022-09-23 DIAGNOSIS — N184 Chronic kidney disease, stage 4 (severe): Secondary | ICD-10-CM | POA: Diagnosis not present

## 2022-09-23 DIAGNOSIS — L709 Acne, unspecified: Secondary | ICD-10-CM | POA: Diagnosis not present

## 2022-09-23 DIAGNOSIS — Z87891 Personal history of nicotine dependence: Secondary | ICD-10-CM | POA: Diagnosis not present

## 2022-09-23 MED ORDER — EPOETIN ALFA-EPBX 20000 UNIT/ML IJ SOLN
20000.0000 [IU] | Freq: Once | INTRAMUSCULAR | Status: AC
  Administered 2022-09-23: 20000 [IU] via SUBCUTANEOUS
  Filled 2022-09-23: qty 1

## 2022-09-24 ENCOUNTER — Other Ambulatory Visit: Payer: Self-pay | Admitting: Family Medicine

## 2022-09-24 DIAGNOSIS — F331 Major depressive disorder, recurrent, moderate: Secondary | ICD-10-CM

## 2022-09-24 NOTE — Telephone Encounter (Signed)
Requested Prescriptions  Pending Prescriptions Disp Refills   busPIRone (BUSPAR) 15 MG tablet [Pharmacy Med Name: BUSPIRONE HCL 15 MG TABLET] 180 tablet 1    Sig: TAKE 1 TABLET BY MOUTH TWICE A DAY     Psychiatry: Anxiolytics/Hypnotics - Non-controlled Passed - 09/24/2022  2:26 AM      Passed - Valid encounter within last 12 months    Recent Outpatient Visits           1 week ago Acute non-recurrent frontal sinusitis   [facility] [name], PA-C   1 month ago CKD (chronic kidney disease) stage 4, GFR 15-29 ml/min [facility])   [facility] [name], PA-C   2 months ago Other fatigue   [facility] Carlin, [place], PA-C   3 months ago Type 2 diabetes mellitus without complication, without long-term current use of insulin [facility])   [facility] Suring, [place], PA-C   5 months ago Abnormal renal function   [facility] Parks, [place], PA-C       Future Appointments             In 3 weeks Duke, Roe Rutherford, PA [facility]   In 2 months Dellis Anes, Hetty Ely, MD [facility]

## 2022-09-28 ENCOUNTER — Encounter: Payer: Medicare Other | Admitting: Clinical

## 2022-09-28 NOTE — Progress Notes (Signed)
                Doree Barthel, LCSWThis encounter was created in error - please disregard.

## 2022-09-30 DIAGNOSIS — M791 Myalgia, unspecified site: Secondary | ICD-10-CM | POA: Diagnosis not present

## 2022-09-30 DIAGNOSIS — G43719 Chronic migraine without aura, intractable, without status migrainosus: Secondary | ICD-10-CM | POA: Diagnosis not present

## 2022-09-30 DIAGNOSIS — M542 Cervicalgia: Secondary | ICD-10-CM | POA: Diagnosis not present

## 2022-09-30 DIAGNOSIS — G518 Other disorders of facial nerve: Secondary | ICD-10-CM | POA: Diagnosis not present

## 2022-10-04 ENCOUNTER — Ambulatory Visit (INDEPENDENT_AMBULATORY_CARE_PROVIDER_SITE_OTHER): Payer: Medicare Other | Admitting: Clinical

## 2022-10-04 DIAGNOSIS — F332 Major depressive disorder, recurrent severe without psychotic features: Secondary | ICD-10-CM

## 2022-10-04 NOTE — Progress Notes (Signed)
                Deni Berti, LCSW 

## 2022-10-04 NOTE — Progress Notes (Signed)
[facility] Counselor/Therapist Progress Note  Patient ID: Abigail Figueroa, MRN: [id number],    Date: 10/04/2022  Time Spent: 1:32pm - 2:21pm : 49 minutes   Treatment Type: Individual Therapy  Reported Symptoms: Patient reported recent improvement in mood.   Mental Status Exam: Appearance:  Neat and Well Groomed     Behavior: Appropriate  Motor: Normal  Speech/Language:  Clear and Coherent  Affect: Appropriate  Mood: normal  Thought process: normal  Thought content:   WNL  Sensory/Perceptual disturbances:   WNL  Orientation: oriented to person, place, and situation  Attention: Good  Concentration: Good  Memory: WNL  Fund of knowledge:  Good  Insight:   Good  Judgment:  Good  Impulse Control: Good   Risk Assessment: Danger to Self:  No Patient denied current suicidal ideation  Self-injurious Behavior: No Danger to Others: No Patient denied current homicidal ideation Duty to Warn:no Physical Aggression / Violence:No  Access to Firearms a concern: No  Gang Involvement:No   Subjective: Patient reported when she thought about her last two appointments "it was too late" and reported she forgot about the appointments. Patient reported she prefers to keep her appointments on Fridays to help patient maintain her appointments.  Patient stated, "pretty good" in response to events since last session. Patient reported her grandson recently graduated from college and plans to pursue a Master's Degree. Patient reported she enjoyed the graduation celebrations for her grandson. Patient stated, "[name] been a lot more calmer, I haven't been letting things get next to me" and stated, "I can tune things out". Patient reported the psychiatrist increased patient's dosage of Prozac. Patient reported the psychiatrist recommended patient receive TMS for treatment of depression and patient reported travel and her family being dependent on only one car are barriers to pursuing TMS. Patient  stated,  "I would have liked to try it" in regards to TMS. Patient stated, "its been going ok" in response to the status of patient's physical health and reported she will see a new specialist this week to treat the diagnosis of kidney disease. Patient reported she has been spending time outside and stated, "I feel free" and "that helps me with my stress" in regards to time outside.  Interventions: Cognitive Behavioral Therapy. Clinician conducted session via caregility video from clinician's home office. Patient provided verbal consent to proceed with telehealth session and participated in session from patient's home. Discussed patient's recent missed appointments and the practice's no show/late cancel policy. Reviewed events since last session. Discussed recent improvement in patient's mood and contributing factors to recent improvement. Discussed patient's thoughts/feelings in response to psychiatrist's recommendation for TMS and explored barriers to treatment. Discussed status of psychiatrist's referral to IOP and health related stressors. Clinician provided information for local transportation resources.   Collaboration of Care: not required at this time   Diagnosis:  Major Depressive Disorder, recurrent, severe without psychotic features   Generalized anxiety disorder   Plan: Patient is to utilize [employer] Therapy, thought re-framing, relaxation techniques, mindfulness, effective communication, and coping strategies to decrease symptoms associated with Major Depressive Disorder and Generalized Anxiety Disorder. Frequency: bi-weekly  Modality: individual      Long-term goal:   Patient stated, "I want to see a change in the anxiety".      Reduce overall level, frequency, and intensity of the symptoms of anxiety and depression as evidenced by decreased sadness, feelings of anxiety, feeling "lost", lack of energy, difficulty falling and staying asleep, changes in concentration,  psychomotor retardation, rapid heart rate, excessive worry, restlessness, feeling jittery, and feeling on edge from 5 to 6 days per week to 0 to 1 days per week per patient reported for at least 3 consecutive months.    Target Date: 02/06/23  Progress: progressing    Short-term goal:  Develop coping strategies to utilize in response to symptoms of depression/anxiety and stressors   Target Date: 02/06/23  Progress: progressing    Verbally express patient's thoughts and feelings to others and utilize effective communication strategies when expressing patient's thoughts/feelings   Target Date: 02/06/23  Progress: progressing    Verbalize an understanding of the relationship between symptoms of anxiety and the impact on patient's thought patterns and behaviors   Target Date: 02/06/23  Progress: progressing    Doree Barthel, LCSW

## 2022-10-05 DIAGNOSIS — E1122 Type 2 diabetes mellitus with diabetic chronic kidney disease: Secondary | ICD-10-CM | POA: Diagnosis not present

## 2022-10-05 DIAGNOSIS — I509 Heart failure, unspecified: Secondary | ICD-10-CM | POA: Diagnosis not present

## 2022-10-05 DIAGNOSIS — G4733 Obstructive sleep apnea (adult) (pediatric): Secondary | ICD-10-CM | POA: Diagnosis not present

## 2022-10-05 DIAGNOSIS — N281 Cyst of kidney, acquired: Secondary | ICD-10-CM | POA: Diagnosis not present

## 2022-10-05 DIAGNOSIS — E785 Hyperlipidemia, unspecified: Secondary | ICD-10-CM | POA: Diagnosis not present

## 2022-10-05 DIAGNOSIS — R569 Unspecified convulsions: Secondary | ICD-10-CM | POA: Diagnosis not present

## 2022-10-05 DIAGNOSIS — D631 Anemia in chronic kidney disease: Secondary | ICD-10-CM | POA: Diagnosis not present

## 2022-10-05 DIAGNOSIS — I10 Essential (primary) hypertension: Secondary | ICD-10-CM | POA: Diagnosis not present

## 2022-10-05 DIAGNOSIS — R8290 Unspecified abnormal findings in urine: Secondary | ICD-10-CM | POA: Diagnosis not present

## 2022-10-06 ENCOUNTER — Ambulatory Visit: Payer: Medicare Other | Attending: Physician Assistant

## 2022-10-06 DIAGNOSIS — R55 Syncope and collapse: Secondary | ICD-10-CM

## 2022-10-07 DIAGNOSIS — G4733 Obstructive sleep apnea (adult) (pediatric): Secondary | ICD-10-CM | POA: Diagnosis not present

## 2022-10-08 ENCOUNTER — Telehealth: Payer: Self-pay

## 2022-10-08 NOTE — Telephone Encounter (Signed)
Spoke with pt. Pt was notified of monitor results.  Pt voiced understanding.

## 2022-10-13 NOTE — Progress Notes (Signed)
I,Vanessa  Vital,acting as a Neurosurgeon for [employer], PA-C.,have documented all relevant documentation on the behalf of [name], PA-C,as directed by  [name], PA-C while in the presence of [name], PA-C.    Established patient visit   Patient: Abigail Figueroa   DOB: 05-01-1961   62 y.o. Female  MRN: [id number] Visit Date: 10/14/2022  Today's healthcare provider: [name], PA-C   Cc. Tremors, back pain  Subjective    HPI   Discussed the use of AI scribe software for clinical note transcription with the patient, who gave verbal consent to proceed.  History of Present Illness   The patient presents with worsening tremors and back pain. The tremors, which are not new, have become more pronounced and occur anytime, worsening when the patient is holding something. The patient also reports that her tongue shakes and she has balance issues, making it difficult to walk. The patient's caregiver confirms these symptoms and adds that the patient's balance is not great.  The patient also complains of back pain, which has been ongoing for a while. The pain is mainly on the right side but can switch sides. It is present when the patient stands up, especially in the mornings, and can bother her while standing, sitting, or in bed. The patient has had back surgeries in the past and is concerned that the current pain might be related to her kidneys.   She has recently been prescribed Calcitriol by a kidney specialist but has been hesitant to take it due to concerns about side effects. The patient took one dose of Calcitriol and reported feeling very sleepy and disoriented.       Medications: Outpatient Medications Prior to Visit  Medication Sig   amLODipine (NORVASC) 5 MG tablet TAKE 1 TABLET (5 MG TOTAL) BY MOUTH DAILY. PLEASE KEEP SCHEDULED APPOINTMENT   BOTOX 100 UNITS SOLR injection Inject into the muscle every 3 (three) months.    busPIRone (BUSPAR) 15 MG tablet  TAKE 1 TABLET BY MOUTH TWICE A DAY   carbamazepine (TEGRETOL-XR) 400 MG 12 hr tablet TAKE 1 TABLET BY MOUTH TWICE A DAY   chlorproMAZINE (THORAZINE) 25 MG tablet Take 50 mg by mouth 2 (two) times daily as needed.   chlorzoxazone (PARAFON) 500 MG tablet Take by mouth 4 (four) times daily as needed for muscle spasms.   clotrimazole-betamethasone (LOTRISONE) cream Apply 1 Application topically 2 (two) times daily.   diclofenac Sodium (VOLTAREN) 1 % GEL Apply topically as needed.   EMGALITY 120 MG/ML SOAJ Inject into the skin as needed.   EPINEPHrine 0.3 mg/0.3 mL IJ SOAJ injection Inject into the muscle.   ezetimibe (ZETIA) 10 MG tablet Take 1 tablet (10 mg total) by mouth daily.   fluocinolone (SYNALAR) 0.01 % external solution Apply topically as needed.   FLUoxetine (PROZAC) 10 MG capsule Take 40 mg by mouth daily.   fluticasone (FLONASE) 50 MCG/ACT nasal spray Place 1-2 sprays into both nostrils as needed for allergies or rhinitis.   furosemide (LASIX) 40 MG tablet Take 1 tablet (40 mg total) by mouth daily.   lamoTRIgine (LAMICTAL) 100 MG tablet Take 100 mg by mouth 2 (two) times daily.   Lancets (ONETOUCH ULTRASOFT) lancets Test fasting each morning and 2 hours before supper. Retest if having hypoglycemic symptoms. (Patient taking differently: 1 each by Other route as needed for other. Test fasting each morning and 2 hours before supper. Retest if having hypoglycemic symptoms.)   metoprolol tartrate (LOPRESSOR) 100  MG tablet TAKE 1 TABLET BY MOUTH TWICE A DAY   ONETOUCH VERIO test strip TEST FASTING GLUCOSE DAILY AS DIRECTED   promethazine (PHENERGAN) 25 MG tablet Take 25 mg by mouth every 4 (four) hours as needed.   rosuvastatin (CRESTOR) 20 MG tablet TAKE 1 TABLET BY MOUTH EVERY DAY   Semaglutide,0.25 or 0.5MG /DOS, (OZEMPIC, 0.25 OR 0.5 MG/DOSE,) 2 MG/3ML SOPN Inject 0.25 mg into the skin once a week. (Patient taking differently: Inject 0.5 mg into the skin once a week.)   tretinoin (RETIN-A)  0.025 % cream Apply topically at bedtime.   triamcinolone ointment (KENALOG) 0.1 % Apply 1 Application topically 2 (two) times daily.   Azelastine HCl 0.15 % SOLN PLACE 2 SPRAYS INTO BOTH NOSTRILS 2 (TWO) TIMES DAILY (Patient taking differently: Place 2 sprays into both nostrils 2 (two) times daily. Taking as needed)   loratadine (CLARITIN) 10 MG tablet Take 1 tablet (10 mg total) by mouth in the morning and at bedtime.   Vilazodone HCl (VIIBRYD) 40 MG TABS Take 1 tablet (40 mg total) by mouth daily. (Patient not taking: Reported on 10/14/2022)   No facility-administered medications prior to visit.    Review of Systems  Neurological:  Positive for tremors.  All other systems reviewed and are negative.    Objective    Blood pressure (!) 104/56, pulse 65, temperature 98.3 F (36.8 C), temperature source Oral, resp. rate 13, height 5\' 3"  (1.6 m), weight 183 lb 11.2 oz (83.3 kg), SpO2 100 %.   Physical Exam Vitals reviewed.  Constitutional:      Appearance: She is not ill-appearing.  HENT:     Head: Normocephalic.  Eyes:     Conjunctiva/sclera: Conjunctivae normal.  Cardiovascular:     Rate and Rhythm: Normal rate.  Pulmonary:     Effort: Pulmonary effort is normal. No respiratory distress.  Neurological:     General: No focal deficit present.     Mental Status: She is alert and oriented to person, place, and time.     Comments: B/l slight intentional tremor, R>L  Resting rolling tremor of R thumb  Psychiatric:        Mood and Affect: Mood normal.        Behavior: Behavior normal.     No results found for any visits on 10/14/22.  Assessment & Plan     Problem List Items Addressed This Visit       Other   Tremor - Primary    Unclear if early parkinsons, essential and intentional tremor, or SE of meds. Discussed ref to neuro, vs home OT/PT. Pt is open to home OT to eval where the tremor becomes an issue with her ADLs      Relevant Orders   Ambulatory referral to Home  Health   Other Visit Diagnoses     Chronic bilateral thoracic back pain          2. Chronic bilateral thoracic back pain Musculoskeletal. Reassured not kidney related Encouraged exercise, hot/cold compress/stretching  Significant time was spent w/ pt advising on polypharmacy, how mental health effects physical health.  Pt was reassured her physical health is stable and she needs to prioritize life and her mental health  Return in about 3 months (around 01/14/2023) for chronic conditions.      I, [name], PA-C have reviewed all documentation for this visit. The documentation on  10/14/22   for the exam, diagnosis, procedures, and orders are all accurate and complete.  Lillia Abed  Minda Ditto [facility] [address] [place], [place], [address] Office: [phone] Fax: [place]

## 2022-10-14 ENCOUNTER — Ambulatory Visit (INDEPENDENT_AMBULATORY_CARE_PROVIDER_SITE_OTHER): Payer: Medicare Other | Admitting: Physician Assistant

## 2022-10-14 ENCOUNTER — Encounter: Payer: Self-pay | Admitting: Physician Assistant

## 2022-10-14 VITALS — BP 104/56 | HR 65 | Temp 98.3°F | Resp 13 | Ht 63.0 in | Wt 183.7 lb

## 2022-10-14 DIAGNOSIS — G8929 Other chronic pain: Secondary | ICD-10-CM | POA: Diagnosis not present

## 2022-10-14 DIAGNOSIS — M546 Pain in thoracic spine: Secondary | ICD-10-CM

## 2022-10-14 DIAGNOSIS — R251 Tremor, unspecified: Secondary | ICD-10-CM

## 2022-10-14 NOTE — Assessment & Plan Note (Signed)
Unclear if early parkinsons, essential and intentional tremor, or SE of meds. Discussed ref to neuro, vs home OT/PT. Pt is open to home OT to eval where the tremor becomes an issue with her ADLs

## 2022-10-15 ENCOUNTER — Ambulatory Visit: Payer: Self-pay | Admitting: *Deleted

## 2022-10-15 NOTE — Telephone Encounter (Signed)
Reason for Disposition  [1] All other patients AND [2] now alert and feels fine  (Exception: SIMPLE FAINT due to stress, pain, prolonged standing, or suddenly standing)    She only wants to see [name], PA-C and she doesn't have any appts until Monday.   I have sent a message to [place].  Answer Assessment - Initial Assessment Questions 1. ONSET: "How long were you unconscious?" (minutes) "When did it happen?"     I didn't completely black out yesterday.   My legs gave away.   My son and daughter caught me.   I don't know what caused it.    Today my tremors are so bad.    I saw her for tremors yesterday and kidney issues.     This happened after I was seen yesterday.    She does know about the tremors.   We talked about it.    OT is to come into the home and help me.    Not sure what is causing the tremors.    We talked about my kidneys too.   2. CONTENT: "What happened during period of unconsciousness?" (e.g., seizure activity)      See above 3. MENTAL STATUS: "Alert and oriented now?" (oriented x 3 = name, month, location)      The tremors are not stopping this time.   Usually they will stop and restart.   They are worse.   Alert and oriented with appropriate conversation. 4. TRIGGER: "What do you think caused the fainting?" "What were you doing just before you fainted?"  (e.g., exercise, sudden standing up, prolonged standing)     No chest pain, shortness of breath, no nausea prior to my legs giving out.    5. RECURRENT SYMPTOM: "Have you ever passed out before?" If Yes, ask: "When was the last time?" and "What happened that time?"      I thought I was having a heart attack before and felt this way but my heart was ok.   I just came off a heart monitor for a month.   Everything was normal.    6. INJURY: "Did you sustain any injury during the fall?"      No injures.   MY son and daughter caught me. 7. CARDIAC SYMPTOMS: "Have you had any of the following symptoms: chest pain, difficulty  breathing, palpitations?"     No 8. NEUROLOGIC SYMPTOMS: "Have you had any of the following symptoms: headache, numbness, vertigo, weakness?"     Tremors 9. GI SYMPTOMS: "Have you had any of the following symptoms: abdomen pain, vomiting, diarrhea, blood in stools?"     No other symptoms 10. OTHER SYMPTOMS: "Do you have any other symptoms?"       No 11. PREGNANCY: "Is there any chance you are pregnant?" "When was your last menstrual period?"       Not asked due to age  Protocols used: Fainting-A-AH

## 2022-10-15 NOTE — Telephone Encounter (Signed)
Patient was advised.  

## 2022-10-15 NOTE — Telephone Encounter (Addendum)
  Chief Complaint: Almost "blacked out"   Son and daughter caught her yesterday.   It happened after she left the office from seeing [name], PA-C yesterday. Symptoms: My legs gave away yesterday.  My tremors are so much worse today.   I saw Lillia Abed yesterday for the tremors and my kidney issues.    Frequency: Yesterday one time Pertinent Negatives: Patient denies actually passing all the way out. Disposition: [] ED /[] Urgent Care (no appt availability in office) / [x] Appointment(In office/virtual)/ []  [facility]/ [] Home Care/ [] Refused Recommended Disposition /[] [facility]/ []  Follow-up with PCP Additional Notes: Pt only wants to see [name], PA-C.   She wants to wait until Monday when she can see Lillia Abed.   Appt made for 8:20 10/15/2022 with [name], PA-C with instructions to go to the ED if this happens again or she gets feeling worse.     I have sent a high priority message to [name], PA-C letting her know of the episode.      I called into the practice and spoke with Okey Regal.   She is going to get my notes to [employer], PA-C.

## 2022-10-15 NOTE — Progress Notes (Unsigned)
I,Dyllan Hughett,acting as a Neurosurgeon for [employer], PA-C.,have documented all relevant documentation on the behalf of [name], PA-C,as directed by  [name], PA-C while in the presence of [name], PA-C.   MyChart Video Visit    Virtual Visit via Video Note   This format is felt to be most appropriate for this patient at this time. Physical exam was limited by quality of the video and audio technology used for the visit.   Patient location: home Provider location: [facility]  I discussed the limitations of evaluation and management by telemedicine and the availability of in person appointments. The patient expressed understanding and agreed to proceed.  Patient: Abigail Figueroa   DOB: 07/03/1960   62 y.o. Female  MRN: [id number] Visit Date: 10/18/2022  Today's healthcare provider: [name], PA-C   Cc. Worsening tremors, weakness  Subjective    HPI  Discussed the use of AI scribe software for clinical note transcription with the patient, who gave verbal consent to proceed.  History of Present Illness   The patient  presents after a recent episode of weakness the past Friday. She reports that while hugging her son, she 'went limp' and required assistance to sit down. She felt weak afterwards and required help to move to the couch. She denies loss of consciousness. The rest of the day was uneventful and she felt normal the next morning.  The patient's tremors have been worsening, particularly on the day of the episode. The tremors have become 'nerve wracking' and are significantly impacting her quality of life.       Medications: Outpatient Medications Prior to Visit  Medication Sig   amLODipine (NORVASC) 5 MG tablet TAKE 1 TABLET (5 MG TOTAL) BY MOUTH DAILY. PLEASE KEEP SCHEDULED APPOINTMENT   BOTOX 100 UNITS SOLR injection Inject into the muscle every 3 (three) months.    busPIRone (BUSPAR) 15 MG tablet TAKE 1 TABLET BY MOUTH TWICE A DAY   calcitRIOL  (ROCALTROL) 0.25 MCG capsule Take 0.25 mcg by mouth daily.   carbamazepine (TEGRETOL-XR) 400 MG 12 hr tablet TAKE 1 TABLET BY MOUTH TWICE A DAY   chlorproMAZINE (THORAZINE) 25 MG tablet Take 50 mg by mouth 2 (two) times daily as needed.   chlorzoxazone (PARAFON) 500 MG tablet Take by mouth 4 (four) times daily as needed for muscle spasms.   clotrimazole-betamethasone (LOTRISONE) cream Apply 1 Application topically 2 (two) times daily.   diclofenac Sodium (VOLTAREN) 1 % GEL Apply topically as needed.   EMGALITY 120 MG/ML SOAJ Inject into the skin as needed.   EPINEPHrine 0.3 mg/0.3 mL IJ SOAJ injection Inject into the muscle.   ezetimibe (ZETIA) 10 MG tablet Take 1 tablet (10 mg total) by mouth daily.   fluocinolone (SYNALAR) 0.01 % external solution Apply topically as needed.   FLUoxetine (PROZAC) 10 MG capsule Take 40 mg by mouth daily.   fluticasone (FLONASE) 50 MCG/ACT nasal spray Place 1-2 sprays into both nostrils as needed for allergies or rhinitis.   furosemide (LASIX) 40 MG tablet Take 1 tablet (40 mg total) by mouth daily.   lamoTRIgine (LAMICTAL) 100 MG tablet Take 100 mg by mouth 2 (two) times daily.   Lancets (ONETOUCH ULTRASOFT) lancets Test fasting each morning and 2 hours before supper. Retest if having hypoglycemic symptoms. (Patient taking differently: 1 each by Other route as needed for other. Test fasting each morning and 2 hours before supper. Retest if having hypoglycemic symptoms.)   metoprolol tartrate (LOPRESSOR) 100 MG  tablet TAKE 1 TABLET BY MOUTH TWICE A DAY   ONETOUCH VERIO test strip TEST FASTING GLUCOSE DAILY AS DIRECTED   promethazine (PHENERGAN) 25 MG tablet Take 25 mg by mouth every 4 (four) hours as needed.   rosuvastatin (CRESTOR) 20 MG tablet TAKE 1 TABLET BY MOUTH EVERY DAY   Semaglutide,0.25 or 0.5MG /DOS, (OZEMPIC, 0.25 OR 0.5 MG/DOSE,) 2 MG/3ML SOPN Inject 0.25 mg into the skin once a week. (Patient taking differently: Inject 0.5 mg into the skin once a  week.)   tretinoin (RETIN-A) 0.025 % cream Apply topically at bedtime.   triamcinolone ointment (KENALOG) 0.1 % Apply 1 Application topically 2 (two) times daily.   Azelastine HCl 0.15 % SOLN PLACE 2 SPRAYS INTO BOTH NOSTRILS 2 (TWO) TIMES DAILY (Patient taking differently: Place 2 sprays into both nostrils 2 (two) times daily. Taking as needed)   loratadine (CLARITIN) 10 MG tablet Take 1 tablet (10 mg total) by mouth in the morning and at bedtime.   [DISCONTINUED] Vilazodone HCl (VIIBRYD) 40 MG TABS Take 1 tablet (40 mg total) by mouth daily. (Patient not taking: Reported on 10/14/2022)   No facility-administered medications prior to visit.    Review of Systems  Neurological:  Positive for tremors.    Objective    Blood pressure 125/72, pulse 69, temperature (!) 97.5 F (36.4 C).   Physical Exam Constitutional:      Appearance: Normal appearance.  Neurological:     Mental Status: She is oriented to person, place, and time.  Psychiatric:        Mood and Affect: Mood normal.        Behavior: Behavior normal.        Assessment & Plan     1. Tremor Referring to neuro given sudden onset worsening of tremors. Pt feels well today Advised to contact office with any change in symptoms  - Ambulatory referral to Neurology   I discussed the assessment and treatment plan with the patient. The patient was provided an opportunity to ask questions and all were answered. The patient agreed with the plan and demonstrated an understanding of the instructions.   The patient was advised to call back or seek an in-person evaluation if the symptoms worsen or if the condition fails to improve as anticipated.  I provided 7 minutes of non-face-to-face time during this encounter.  I, [name], PA-C have reviewed all documentation for this visit. The documentation on  10/18/22   for the exam, diagnosis, procedures, and orders are all accurate and complete.  [name], PA-C [facility] [address] [place], [place], [address] Office: [phone] Fax: [phone]   [facility]

## 2022-10-16 NOTE — Progress Notes (Unsigned)
Cardiology Office Note:    Date:  10/19/2022   ID:  Abigail Figueroa, DOB December 03, 1960, MRN [id number]  PCP:  [name], PA-C   [facility] Providers Cardiologist:  [name], MD Cardiology APP:  Marcelino Duster, PA     Referring MD: [name], PA-C   Chief Complaint  Patient presents with   Follow-up    Near syncope, palpitations    History of Present Illness:    Abigail Figueroa is a 62 y.o. female with a hx of hypertension, palpitations, hyperlipidemia, OSA on CPAP, seizure disorder, DM 2, and anemia.   Heart catheterization 08/2015 with normal coronaries and normal LV function.  This was in response to an abnormal stress test most likely due to body habitus.  Unfortunately she stopped using her CPAP machine because it started making a loud noise.  She was seen 01/09/21 with [name] NP. She reported chest discomfort and arm pain at rest that sounded atypical. No ischemic evaluation planned. I saw her in follow up 01/07/22 with better compliance on CPAP but increased sodium in her diet leading to DOE, weight gain, and lower extremity edema.  I advised to reduce salt and increase lasix to 40 / 20 mg x 5 days, then 40 mg daily with an extra 20 mg three times weekly.   Unfortunately, BMP reveals worsening renal function, confirmed on recheck. I advised to reduce lasix to 40 mg daily and to stay hydrated. I referred to nephrology.  Echo was repeated and showed LVEF 60-65%, grade 1 DD, normal RV, and no significant valvular disease.   When I saw her back 04/12/22 she reported palpitations several times weekly but did not want to wear a heart monitor. Chest discomfort treated medically given reassuring heart cath in 2017 and renal function precluding CCTA, prior nuclear stress test with false positive. She was doing well when she saw Dr. Tresa Endo in Jan 2024. She saw Gillian Shields NP 07/2022. Imdur was stopped due to persistent headaches. BP now managed with increased  dose of amlodipine.  She was very concerned about sarcoid as she has a family history (mother, uncle).   Per NP Dan Humphreys: We could actually do the PET scan (it will not negatively effect the kidneys as it does not use contrast eye) or an MRI. With PET we can see active inflammation but the MRI lets see better if there is or isn't sarcoid but harder to see which is new vs old. Both are reasonable choices with pros and cons.    In speaking with Dr. Jacques Navy (our cardiologist who specializes in imaging) she said we can start with either test. MRI is a long exam (probably at least 45 minutes) whereas PET is shorter test (probably 10 minutes) but there is a diet prep you have to do beforehand. With the pre-PET diet you have to do a high fat, zero carb diet for 24 hours prior to the test to get the most accurate results. Our imaging team can give more specific details on how to follow that diet one day before the CT.  I saw her for follow-up of an ER visit 07/13/2022 for chest pain and syncope which was unwitnessed.  Workup was largely negative and she was back to baseline without admission.  She had an additional possible syncopal episode/24/24 when she fell back on the stairs-this episode was witnessed but unclear if she had loss of consciousness.  Given that she had 2 episodes of questionable syncope in 6  weeks I opted to place a 30-day monitor.  Heart monitor was overall reassuring without pauses, A-fib, or significant arrhythmia without heart block.  She also had a nuclear stress test 07/21/2022 that was nonischemic.   She presents today for routine follow-up. She has had another episode of syncope. With further questioning, she did not lose consciousness but felt like she "gave out."  Today she also reports her tremors are getting worse.  She has an appointment with neurology upcoming.  She also states that her palpitations are getting worse.  Upon further review, her PTH was significantly elevated and she  was recently started on calcitriol by nephrology.   Past Medical History:  Diagnosis Date   Abnormal stress test    a. 10/2009: Normal myocardial perfusion imaging; b. 08/2015 MV: medium defect of mod severity in mid ant apical region w/ mild HK of the distal inf wall;  c. 08/2015 Cath: nl Cors, EF 60%.   Anemia    Angio-edema    Anxiety    Arthritis    Bell's palsy    Depression    Dermatitis 09/10/2014   Edema    Essential hypertension    Frequent urination    Frequent urination at night    Headache    migraines, gets botox injections   Heart palpitations 10/2011   Event monitor showing sinus tachycardia, PACs with couplets and triplets.   Hx of echocardiogram    a. 10/2009 Echo: showed normal left ventricular function, mild left ventricular hypertrophy, and no significant valve abnormalities.   Hypercholesterolemia    Migraine    Morbid obesity (HCC)    Neuromuscular disorder (HCC)    OSA (obstructive sleep apnea)    has been on continuous positive airway pressure   Seizure disorder (HCC)    Seizures (HCC)    Type II diabetes mellitus (HCC)    Urticaria     Past Surgical History:  Procedure Laterality Date   ABDOMINAL HYSTERECTOMY  1990   without BSO   BACK SURGERY  1900s 2001   x2 with "cage put in"   BILATERAL SALPINGOOPHORECTOMY  1990   CARDIAC CATHETERIZATION N/A 08/06/2015   Procedure: Left Heart Cath and Coronary Angiography;  Surgeon: Lennette Bihari, MD;  Location: [facility];  Service: Cardiovascular;  Laterality: N/A;   CARPAL TUNNEL RELEASE Bilateral    CHOLECYSTECTOMY  1980   COLONOSCOPY WITH PROPOFOL N/A 08/30/2017   Procedure: COLONOSCOPY WITH PROPOFOL;  Surgeon: Toney Reil, MD;  Location: [facility];  Service: Endoscopy;  Laterality: N/A;   DG THUMB LEFT HAND  01/24/2018   repair joint in left thumb   DORSAL COMPARTMENT RELEASE Left 10/25/2014   Procedure: LEFT FIRST  DORSAL COMPARTMENT RELEASE AND RADIAL TENOSYNOVECTOMY ;  Surgeon:  Dominica Severin, MD;  Location: [facility];  Service: Orthopedics;  Laterality: Left;   HAND SURGERY     KNEE SURGERY Right 2012   meniscus tear   KNEE SURGERY  2012   LAMINECTOMY  1995   TUBAL LIGATION  1982   WRIST SURGERY Right     Current Medications: Current Meds  Medication Sig   amLODipine (NORVASC) 5 MG tablet TAKE 1 TABLET (5 MG TOTAL) BY MOUTH DAILY. PLEASE KEEP SCHEDULED APPOINTMENT   BOTOX 100 UNITS SOLR injection Inject into the muscle every 3 (three) months.    busPIRone (BUSPAR) 15 MG tablet TAKE 1 TABLET BY MOUTH TWICE A DAY   calcitRIOL (ROCALTROL) 0.25 MCG capsule Take 0.25 mcg  by mouth daily.   carbamazepine (TEGRETOL-XR) 400 MG 12 hr tablet TAKE 1 TABLET BY MOUTH TWICE A DAY   chlorproMAZINE (THORAZINE) 25 MG tablet Take 50 mg by mouth 2 (two) times daily as needed.   chlorzoxazone (PARAFON) 500 MG tablet Take by mouth 4 (four) times daily as needed for muscle spasms.   clotrimazole-betamethasone (LOTRISONE) cream Apply 1 Application topically 2 (two) times daily.   diclofenac Sodium (VOLTAREN) 1 % GEL Apply topically as needed.   EMGALITY 120 MG/ML SOAJ Inject into the skin as needed.   EPINEPHrine 0.3 mg/0.3 mL IJ SOAJ injection Inject into the muscle.   ezetimibe (ZETIA) 10 MG tablet Take 1 tablet (10 mg total) by mouth daily.   fluocinolone (SYNALAR) 0.01 % external solution Apply topically as needed.   FLUoxetine (PROZAC) 10 MG capsule Take 40 mg by mouth daily.   fluticasone (FLONASE) 50 MCG/ACT nasal spray Place 1-2 sprays into both nostrils as needed for allergies or rhinitis.   furosemide (LASIX) 40 MG tablet Take 1 tablet (40 mg total) by mouth daily.   lamoTRIgine (LAMICTAL) 100 MG tablet Take 100 mg by mouth 2 (two) times daily.   Lancets (ONETOUCH ULTRASOFT) lancets Test fasting each morning and 2 hours before supper. Retest if having hypoglycemic symptoms. (Patient taking differently: 1 each by Other route as needed for other. Test fasting  each morning and 2 hours before supper. Retest if having hypoglycemic symptoms.)   metoprolol tartrate (LOPRESSOR) 100 MG tablet TAKE 1 TABLET BY MOUTH TWICE A DAY   ONETOUCH VERIO test strip TEST FASTING GLUCOSE DAILY AS DIRECTED   promethazine (PHENERGAN) 25 MG tablet Take 25 mg by mouth every 4 (four) hours as needed.   rosuvastatin (CRESTOR) 20 MG tablet TAKE 1 TABLET BY MOUTH EVERY DAY   Semaglutide,0.25 or 0.5MG /DOS, (OZEMPIC, 0.25 OR 0.5 MG/DOSE,) 2 MG/3ML SOPN Inject 0.25 mg into the skin once a week. (Patient taking differently: Inject 0.5 mg into the skin once a week.)   tretinoin (RETIN-A) 0.025 % cream Apply topically at bedtime.   triamcinolone ointment (KENALOG) 0.1 % Apply 1 Application topically 2 (two) times daily.     Allergies:   Morphine, Morphine and codeine, Oxycodone, Oxycodone hcl, Aimovig [erenumab-aooe], Augmentin [amoxicillin-pot clavulanate], and Morphine sulfate   Social History   Socioeconomic History   Marital status: Married    Spouse name: Not on file   Number of children: 2   Years of education: Not on file   Highest education level: 12th grade  Occupational History   Occupation: disabled  Tobacco Use   Smoking status: Former    Types: Cigarettes    Quit date: 01/03/1983    Years since quitting: 39.8   Smokeless tobacco: Never   Tobacco comments:    quit in 1984  Vaping Use   Vaping Use: Never used  Substance and Sexual Activity   Alcohol use: No    Alcohol/week: 0.0 standard drinks of alcohol   Drug use: No   Sexual activity: Never  Other Topics Concern   Not on file  Social History Narrative   ** Merged History Encounter **       She is a married mother of 2, grandmother 3. She tries to get exercise but is not doing any routine program.   She quit smoking over 25 years ago does not drink alcohol.   Social Determinants of Health   Financial Resource Strain: Low Risk  (11/26/2021)   Overall Financial Resource Strain (CARDIA)  Difficulty of Paying Living Expenses: Not hard at all  Food Insecurity: No Food Insecurity (11/26/2021)   Hunger Vital Sign    Worried About Running Out of Food in the Last Year: Never true    Ran Out of Food in the Last Year: Never true  Transportation Needs: Unmet Transportation Needs (11/26/2021)   PRAPARE - Transportation    Lack of Transportation (Medical): Yes    Lack of Transportation (Non-Medical): Yes  Physical Activity: Insufficiently Active (11/26/2021)   Exercise Vital Sign    Days of Exercise per Week: 7 days    Minutes of Exercise per Session: 20 min  Stress: Stress Concern Present (11/26/2021)   [employer] of Occupational Health - Occupational Stress Questionnaire    Feeling of Stress : To some extent  Social Connections: Moderately Isolated (11/26/2021)   Social Connection and Isolation Panel [NHANES]    Frequency of Communication with Friends and Family: More than three times a week    Frequency of Social Gatherings with Friends and Family: More than three times a week    Attends Religious Services: Never    Database administrator or Organizations: No    Attends Engineer, structural: Never    Marital Status: Married     Family History: The patient's family history includes Alzheimer's disease in her maternal grandmother; Dementia in her maternal grandmother; Diabetes in her maternal grandfather; Heart attack in her mother; Hypertension in her maternal grandfather; Sarcoidosis in her mother; Seizures in her mother. There is no history of Breast cancer, Allergic rhinitis, Asthma, Eczema, or Urticaria.  ROS:   Please see the history of present illness.     All other systems reviewed and are negative.  EKGs/Labs/Other Studies Reviewed:    The following studies were reviewed today:  Heart monitor 10/06/22: Patient wore a 30 day monitor from 09/02/2022 through 10/01/2022. The patient maintained sinus rhythm with an average heart rate at 70 bpm. Slowest heart  rate was sinus bradycardia at 57 bpm which occurred on May 7 at 11:31 PM. The fastest heart rate was sinus tachycardia at 112 bpm which occurred on May 13 at 9:02 AM. There were no sinus pauses or episodes of atrial fibrillation. There was no significant arrhythmia. No pauses were demonstrated.    Nuclear stress test 07/22/22:   The study is normal. The study is low risk.   No ST deviation was noted. No change in baseline nonspecific ST abnormality.   LV perfusion is normal. There is no evidence of ischemia. There is no evidence of infarction.   Left ventricular function is normal. Nuclear stress EF: 64 %. The left ventricular ejection fraction is normal (55-65%). End diastolic cavity size is normal. End systolic cavity size is normal. No evidence of transient ischemic dilation (TID) noted.   Prior study available for comparison from 07/25/2015. There are changes compared to prior study which appear to be improved.  EKG:  EKG is not ordered today.    Recent Labs: 03/29/2022: BNP 8.6 04/12/2022: Magnesium 2.5 06/15/2022: TSH 2.070 07/13/2022: ALT 80; BUN 40; Creatinine, Ser 2.24; Potassium 3.5; Sodium 139 09/21/2022: Hemoglobin 9.6; Platelets 214  Recent Lipid Panel    Component Value Date/Time   CHOL 132 11/05/2021 0924   CHOL 277 (H) 08/16/2014 0843   TRIG 100 11/05/2021 0924   TRIG 167 (H) 08/16/2014 0843   HDL 73 11/05/2021 0924   HDL 77 08/16/2014 0843   CHOLHDL 1.8 11/05/2021 0924   CHOLHDL 2.8 02/27/2019 0818  VLDL 22 02/27/2019 0818   VLDL 33 08/16/2014 0843   LDLCALC 41 11/05/2021 0924   LDLCALC 190 (H) 02/28/2017 1009   LDLCALC 167 (H) 08/16/2014 0843     Risk Assessment/Calculations:                Physical Exam:    VS:  BP 108/64 (BP Location: Right Arm, Patient Position: Sitting, Cuff Size: Normal)   Pulse 70   Ht 5\' 3"  (1.6 m)   Wt 182 lb (82.6 kg)   LMP  (LMP Unknown)   SpO2 97%   BMI 32.24 kg/m     Wt Readings from Last 3 Encounters:  10/19/22 182 lb  (82.6 kg)  10/14/22 183 lb 11.2 oz (83.3 kg)  09/21/22 185 lb 3.2 oz (84 kg)     GEN:  Well nourished, well developed in no acute distress HEENT: Normal NECK: No JVD; No carotid bruits LYMPHATICS: No lymphadenopathy CARDIAC: RRR, no murmurs, rubs, gallops RESPIRATORY:  Clear to auscultation without rales, wheezing or rhonchi  ABDOMEN: Soft, non-tender, non-distended MUSCULOSKELETAL:  No edema; No deformity  SKIN: Warm and dry NEUROLOGIC:  Alert and oriented x 3 PSYCHIATRIC:  Normal affect   ASSESSMENT:    1. Hyperlipidemia with target LDL less than 70   2. Near syncope   3. Palpitations   4. CKD (chronic kidney disease) stage 4, GFR 15-29 ml/min (HCC)   5. Essential hypertension   6. Mixed hyperlipidemia    PLAN:    In order of problems listed above:  Questionable syncope 2 episodes in the span of 6 weeks 30-day monitor was placed and was unrevealing She does not drive She had an additional episode in which she "gave out" she denies frank syncope She is having additional problems with palpitations I discussed potential ILR should her symptoms continue after normalization of her PTH and neurology evaluation.  Do not feel strongly about doing a carotid ultrasound, but this can be considered in the future.   CKD Now followed by nephrology Intermittent increasing in lasix resulted in worsening renal function, difficult to control volume status with dietary indiscretion   Hypertension - metoprolol, amlodipine, and furosemide   - did not tolerate isordil due to persistent headaches   OSA on CPAP Now compliant with new mask   Hyperlipidemia with LDL goal < 70 11/05/2021: Cholesterol, Total 132; HDL 73; LDL Chol Calc (NIH) 41; Triglycerides 100 Clean coronaries by cath in 2017 Continue statin and zetia - update lipid panel today   Follow-up in 6 months with Dr. Tresa Endo.      Medication Adjustments/Labs and Tests Ordered: Current medicines are reviewed at length  with the patient today.  Concerns regarding medicines are outlined above.  Orders Placed This Encounter  Procedures   Lipid panel   No orders of the defined types were placed in this encounter.   Patient Instructions  Medication Instructions:   Your physician recommends that you continue on your current medications as directed. Please refer to the Current Medication list given to you today.  *If you need a refill on your cardiac medications before your next appointment, please call your pharmacy*  Lab Work: Your physician recommends that you return have lab work TODAY:  Fasting Lipid Panel  If you have labs (blood work) drawn today and your tests are completely normal, you will receive your results only by: MyChart Message (if you have MyChart) OR A paper copy in the mail If you have any lab test that is  abnormal or we need to change your treatment, we will call you to review the results.  Testing/Procedures: NONE ordered at this time of appointment   Follow-Up: At [facility], you and your health needs are our priority.  As part of our continuing mission to provide you with exceptional heart care, we have created designated Provider Care Teams.  These Care Teams include your primary Cardiologist (physician) and Advanced Practice Providers (APPs -  Physician Assistants and Nurse Practitioners) who all work together to provide you with the care you need, when you need it.  Your next appointment:   6 month(s)  Provider:   [name], MD     Other Instructions     Signed, Marcelino Duster, [place]  10/19/2022 3:00 PM    [facility]

## 2022-10-18 ENCOUNTER — Telehealth (INDEPENDENT_AMBULATORY_CARE_PROVIDER_SITE_OTHER): Payer: Medicare Other | Admitting: Physician Assistant

## 2022-10-18 ENCOUNTER — Encounter: Payer: Self-pay | Admitting: Physician Assistant

## 2022-10-18 VITALS — BP 125/72 | HR 69 | Temp 97.5°F

## 2022-10-18 DIAGNOSIS — R251 Tremor, unspecified: Secondary | ICD-10-CM | POA: Diagnosis not present

## 2022-10-19 ENCOUNTER — Ambulatory Visit: Payer: Medicare Other | Attending: Physician Assistant | Admitting: Physician Assistant

## 2022-10-19 ENCOUNTER — Encounter: Payer: Self-pay | Admitting: Oncology

## 2022-10-19 ENCOUNTER — Encounter: Payer: Self-pay | Admitting: Physician Assistant

## 2022-10-19 ENCOUNTER — Encounter: Payer: Self-pay | Admitting: Allergy & Immunology

## 2022-10-19 VITALS — BP 108/64 | HR 70 | Ht 63.0 in | Wt 182.0 lb

## 2022-10-19 DIAGNOSIS — E782 Mixed hyperlipidemia: Secondary | ICD-10-CM | POA: Diagnosis not present

## 2022-10-19 DIAGNOSIS — E785 Hyperlipidemia, unspecified: Secondary | ICD-10-CM

## 2022-10-19 DIAGNOSIS — R55 Syncope and collapse: Secondary | ICD-10-CM | POA: Diagnosis not present

## 2022-10-19 DIAGNOSIS — N184 Chronic kidney disease, stage 4 (severe): Secondary | ICD-10-CM

## 2022-10-19 DIAGNOSIS — I10 Essential (primary) hypertension: Secondary | ICD-10-CM

## 2022-10-19 DIAGNOSIS — R002 Palpitations: Secondary | ICD-10-CM

## 2022-10-19 NOTE — Patient Instructions (Signed)
Medication Instructions:   Your physician recommends that you continue on your current medications as directed. Please refer to the Current Medication list given to you today.  *If you need a refill on your cardiac medications before your next appointment, please call your pharmacy*  Lab Work: Your physician recommends that you return have lab work TODAY:  Fasting Lipid Panel  If you have labs (blood work) drawn today and your tests are completely normal, you will receive your results only by: MyChart Message (if you have MyChart) OR A paper copy in the mail If you have any lab test that is abnormal or we need to change your treatment, we will call you to review the results.  Testing/Procedures: NONE ordered at this time of appointment   Follow-Up: At [facility], you and your health needs are our priority.  As part of our continuing mission to provide you with exceptional heart care, we have created designated Provider Care Teams.  These Care Teams include your primary Cardiologist (physician) and Advanced Practice Providers (APPs -  Physician Assistants and Nurse Practitioners) who all work together to provide you with the care you need, when you need it.  Your next appointment:   6 month(s)  Provider:   [name], MD     Other Instructions

## 2022-10-20 ENCOUNTER — Telehealth: Payer: Self-pay

## 2022-10-20 ENCOUNTER — Inpatient Hospital Stay: Payer: Medicare Other | Attending: Oncology

## 2022-10-20 ENCOUNTER — Inpatient Hospital Stay: Payer: Medicare Other

## 2022-10-20 VITALS — BP 115/77

## 2022-10-20 DIAGNOSIS — N184 Chronic kidney disease, stage 4 (severe): Secondary | ICD-10-CM | POA: Diagnosis not present

## 2022-10-20 DIAGNOSIS — D631 Anemia in chronic kidney disease: Secondary | ICD-10-CM | POA: Diagnosis not present

## 2022-10-20 LAB — LIPID PANEL
Chol/HDL Ratio: 1.9 ratio (ref 0.0–4.4)
Cholesterol, Total: 150 mg/dL (ref 100–199)
HDL: 79 mg/dL (ref >39)
LDL Chol Calc (NIH): 56 mg/dL (ref 0–99)
Triglycerides: 82 mg/dL (ref 0–149)
VLDL Cholesterol Cal: 15 mg/dL (ref 5–40)

## 2022-10-20 LAB — HEMOGLOBIN AND HEMATOCRIT, BLOOD
HCT: 29.7 % — ABNORMAL LOW (ref 36.0–46.0)
Hemoglobin: 9.7 g/dL — ABNORMAL LOW (ref 12.0–15.0)

## 2022-10-20 MED ORDER — EPOETIN ALFA-EPBX 20000 UNIT/ML IJ SOLN
20000.0000 [IU] | Freq: Once | INTRAMUSCULAR | Status: AC
  Administered 2022-10-20: 20000 [IU] via SUBCUTANEOUS
  Filled 2022-10-20: qty 1

## 2022-10-20 NOTE — Telephone Encounter (Signed)
Lmom to discuss lab results. Waiting on a return call.  

## 2022-10-21 ENCOUNTER — Ambulatory Visit (INDEPENDENT_AMBULATORY_CARE_PROVIDER_SITE_OTHER): Payer: Medicare Other | Admitting: Neurology

## 2022-10-21 ENCOUNTER — Encounter: Payer: Self-pay | Admitting: Neurology

## 2022-10-21 VITALS — BP 108/69 | HR 69 | Ht 63.0 in | Wt 181.0 lb

## 2022-10-21 DIAGNOSIS — R251 Tremor, unspecified: Secondary | ICD-10-CM | POA: Diagnosis not present

## 2022-10-21 NOTE — Progress Notes (Signed)
Subjective:    Patient ID: Abigail Figueroa is a 62 y.o. female.  HPI    Abigail Foley, MD, PhD [facility] [address] [address] [place], [place] [address]  Dear Abigail Figueroa,  I saw your patient, Abigail Figueroa, upon your kind request in my neurologic clinic today for initial consultation of her tremors.  The patient is accompanied by her husband today.  As you know, Abigail Figueroa is a 62 year old female with an underlying medical history of anemia, arthritis, anxiety, depression, edema, hypertension, headaches, palpitations, hyperlipidemia, sleep apnea, history of seizures, Bell's palsy, diabetes, urticaria, and obesity, who reports an intermittent hand tremor bilateral hand tremors for the past 1 and half years, she first noticed it around January 2023.  Her tremor is noticeable particularly when she holds something or passes something with her hands to someone else or when she writes.  She has no other strong family history of tremors, 1 uncle had a tremor.  Neither parent had tremors, she does not know much about her dad side of the family, grandparents on mom side did not have any tremors.  She had a total of 3 brothers, she has 1 brother alive at age 39 without any tremor issues, 1 brother died at 55 and 1 brother died in his 30s from heart failure.  She quit smoking in her early 61s, she does not drink any alcohol, she stopped drinking caffeine since November 2023.  She is on as needed Thorazine, but reports that she has not taken it in a long time as it is sedating for her.  It is prescribed by her migraine specialist.  She has recently started Prozac about a month and a half ago and stopped Viibryd at the time.  This is through her psychiatrist.  Her migraine specialist also changed her from baclofen to Parafon within the last year.   I reviewed your video visit note from 10/18/2022.  She had a TSH on 06/15/2022 and it was normal at 2.07.  Of note, she is on  multiple medications including psychotropic medications.  She is followed by psychiatry.  She is also on a beta-blocker currently, namely Lopressor.  She is currently on Prozac 40 mg daily, she is on Lamictal 100 mg twice daily, she is on BuSpar 15 mg twice daily, carbamazepine 400 mg twice daily, Thorazine 50 mg twice daily.  Full list of medication as below.  Of note, she had seen Dr. Arbutus Leas with [facility] neurology in 2015 for tremors.  She was not felt to have essential tremor at the time.  She sees Dr. Neale Burly for her migraines.   Her Past Medical History Is Significant For: Past Medical History:  Diagnosis Date   Abnormal stress test    a. 10/2009: Normal myocardial perfusion imaging; b. 08/2015 MV: medium defect of mod severity in mid ant apical region w/ mild HK of the distal inf wall;  c. 08/2015 Cath: nl Cors, EF 60%.   Anemia    Angio-edema    Anxiety    Arthritis    Bell's palsy    Depression    Dermatitis 09/10/2014   Edema    Essential hypertension    Frequent urination    Frequent urination at night    Headache    migraines, gets botox injections   Heart palpitations 10/2011   Event monitor showing sinus tachycardia, PACs with couplets and triplets.   Hx of echocardiogram    a. 10/2009 Echo: showed normal left  ventricular function, mild left ventricular hypertrophy, and no significant valve abnormalities.   Hypercholesterolemia    Migraine    Morbid obesity (HCC)    Neuromuscular disorder (HCC)    OSA (obstructive sleep apnea)    has been on continuous positive airway pressure   Seizure disorder (HCC)    Seizures (HCC)    Type II diabetes mellitus (HCC)    Urticaria     Her Past Surgical History Is Significant For: Past Surgical History:  Procedure Laterality Date   ABDOMINAL HYSTERECTOMY  1990   without BSO   BACK SURGERY  1900s 2001   x2 with "cage put in"   BILATERAL SALPINGOOPHORECTOMY  1990   CARDIAC CATHETERIZATION N/A 08/06/2015   Procedure: Left Heart Cath  and Coronary Angiography;  Surgeon: Lennette Bihari, MD;  Location: [facility];  Service: Cardiovascular;  Laterality: N/A;   CARPAL TUNNEL RELEASE Bilateral    CHOLECYSTECTOMY  1980   COLONOSCOPY WITH PROPOFOL N/A 08/30/2017   Procedure: COLONOSCOPY WITH PROPOFOL;  Surgeon: Toney Reil, MD;  Location: [facility] ENDOSCOPY;  Service: Endoscopy;  Laterality: N/A;   DG THUMB LEFT HAND  01/24/2018   repair joint in left thumb   DORSAL COMPARTMENT RELEASE Left 10/25/2014   Procedure: LEFT FIRST  DORSAL COMPARTMENT RELEASE AND RADIAL TENOSYNOVECTOMY ;  Surgeon: Dominica Severin, MD;  Location: [facility];  Service: Orthopedics;  Laterality: Left;   HAND SURGERY     KNEE SURGERY Right 2012   meniscus tear   KNEE SURGERY  2012   LAMINECTOMY  1995   TUBAL LIGATION  1982   WRIST SURGERY Right     Her Family History Is Significant For: Family History  Problem Relation Age of Onset   Sarcoidosis Mother    Seizures Mother    Heart attack Mother    Alzheimer's disease Maternal Grandmother    Dementia Maternal Grandmother    Hypertension Maternal Grandfather    Diabetes Maternal Grandfather    Breast cancer Neg Hx    Allergic rhinitis Neg Hx    Asthma Neg Hx    Eczema Neg Hx    Urticaria Neg Hx     Her Social History Is Significant For: Social History   Socioeconomic History   Marital status: Married    Spouse name: Not on file   Number of children: 2   Years of education: Not on file   Highest education level: 12th grade  Occupational History   Occupation: disabled  Tobacco Use   Smoking status: Former    Types: Cigarettes    Quit date: 01/03/1983    Years since quitting: 39.8   Smokeless tobacco: Never   Tobacco comments:    quit in 1984  Vaping Use   Vaping Use: Never used  Substance and Sexual Activity   Alcohol use: No    Alcohol/week: 0.0 standard drinks of alcohol   Drug use: No   Sexual activity: Never  Other Topics Concern   Not on file   Social History Narrative   ** Merged History Encounter ** She is a married mother of 2, grandmother 3. She tries to get exercise but is not doing any routine program.She quit smoking over 25 years ago does not drink alcohol.   No caffiene since 03/2022.    Social Determinants of Health   Financial Resource Strain: Low Risk  (11/26/2021)   Overall Financial Resource Strain (CARDIA)    Difficulty of Paying Living Expenses: Not hard at  all  Food Insecurity: No Food Insecurity (11/26/2021)   Hunger Vital Sign    Worried About Running Out of Food in the Last Year: Never true    Ran Out of Food in the Last Year: Never true  Transportation Needs: Unmet Transportation Needs (11/26/2021)   PRAPARE - Transportation    Lack of Transportation (Medical): Yes    Lack of Transportation (Non-Medical): Yes  Physical Activity: Insufficiently Active (11/26/2021)   Exercise Vital Sign    Days of Exercise per Week: 7 days    Minutes of Exercise per Session: 20 min  Stress: Stress Concern Present (11/26/2021)   [employer] of Occupational Health - Occupational Stress Questionnaire    Feeling of Stress : To some extent  Social Connections: Moderately Isolated (11/26/2021)   Social Connection and Isolation Panel [NHANES]    Frequency of Communication with Friends and Family: More than three times a week    Frequency of Social Gatherings with Friends and Family: More than three times a week    Attends Religious Services: Never    Database administrator or Organizations: No    Attends Engineer, structural: Never    Marital Status: Married    Her Allergies Are:  Allergies  Allergen Reactions   Morphine Rash and Swelling   Morphine And Codeine Hives    Nausea and vomiting  Nausea and vomiting   Oxycodone Hives, Itching, Rash, Swelling and Nausea And Vomiting   Oxycodone Hcl Swelling, Hives and Itching   Aimovig [Erenumab-Aooe]     Questionable lip swelling angoiedema   Augmentin  [Amoxicillin-Pot Clavulanate] Hives   Morphine Sulfate Itching and Nausea And Vomiting    REACTION: swelling  :   Her Current Medications Are:  Outpatient Encounter Medications as of 10/21/2022  Medication Sig   amLODipine (NORVASC) 5 MG tablet TAKE 1 TABLET (5 MG TOTAL) BY MOUTH DAILY. PLEASE KEEP SCHEDULED APPOINTMENT   Azelastine HCl 0.15 % SOLN PLACE 2 SPRAYS INTO BOTH NOSTRILS 2 (TWO) TIMES DAILY (Patient taking differently: Place 2 sprays into both nostrils 2 (two) times daily. Taking as needed)   BOTOX 100 UNITS SOLR injection Inject into the muscle every 3 (three) months.    busPIRone (BUSPAR) 15 MG tablet TAKE 1 TABLET BY MOUTH TWICE A DAY   calcitRIOL (ROCALTROL) 0.25 MCG capsule Take 0.25 mcg by mouth daily.   carbamazepine (TEGRETOL-XR) 400 MG 12 hr tablet TAKE 1 TABLET BY MOUTH TWICE A DAY   chlorproMAZINE (THORAZINE) 25 MG tablet Take 50 mg by mouth 2 (two) times daily as needed.   chlorzoxazone (PARAFON) 500 MG tablet Take by mouth 4 (four) times daily as needed for muscle spasms.   clotrimazole-betamethasone (LOTRISONE) cream Apply 1 Application topically 2 (two) times daily.   diclofenac Sodium (VOLTAREN) 1 % GEL Apply topically as needed.   EMGALITY 120 MG/ML SOAJ Inject into the skin as needed.   EPINEPHrine 0.3 mg/0.3 mL IJ SOAJ injection Inject into the muscle.   ezetimibe (ZETIA) 10 MG tablet Take 1 tablet (10 mg total) by mouth daily.   fluocinolone (SYNALAR) 0.01 % external solution Apply topically as needed.   FLUoxetine (PROZAC) 10 MG capsule Take 40 mg by mouth daily.   fluticasone (FLONASE) 50 MCG/ACT nasal spray Place 1-2 sprays into both nostrils as needed for allergies or rhinitis.   furosemide (LASIX) 40 MG tablet Take 1 tablet (40 mg total) by mouth daily.   lamoTRIgine (LAMICTAL) 100 MG tablet Take 100 mg by mouth 2 (  two) times daily.   Lancets (ONETOUCH ULTRASOFT) lancets Test fasting each morning and 2 hours before supper. Retest if having hypoglycemic  symptoms. (Patient taking differently: 1 each by Other route as needed for other. Test fasting each morning and 2 hours before supper. Retest if having hypoglycemic symptoms.)   metoprolol tartrate (LOPRESSOR) 100 MG tablet TAKE 1 TABLET BY MOUTH TWICE A DAY   ONETOUCH VERIO test strip TEST FASTING GLUCOSE DAILY AS DIRECTED   promethazine (PHENERGAN) 25 MG tablet Take 25 mg by mouth every 4 (four) hours as needed.   rosuvastatin (CRESTOR) 20 MG tablet TAKE 1 TABLET BY MOUTH EVERY DAY   Semaglutide,0.25 or 0.5MG /DOS, (OZEMPIC, 0.25 OR 0.5 MG/DOSE,) 2 MG/3ML SOPN Inject 0.25 mg into the skin once a week. (Patient taking differently: Inject 0.5 mg into the skin once a week.)   tretinoin (RETIN-A) 0.025 % cream Apply topically at bedtime.   triamcinolone ointment (KENALOG) 0.1 % Apply 1 Application topically 2 (two) times daily.   loratadine (CLARITIN) 10 MG tablet Take 1 tablet (10 mg total) by mouth in the morning and at bedtime.   No facility-administered encounter medications on file as of 10/21/2022.  :   Review of Systems:  Out of a complete 14 point review of systems, all are reviewed and negative with the exception of these symptoms as listed below:   Review of Systems  Neurological:        Tremors since January 2023, worsening since 2024.  Bilateral hands, head and tongue.  Intermittent, anytime of day.  Same on both hadns.  No caffiene since Nov 2023, no change.  Cannot stop intentionally. No fam hx of PD.  She said she has had falls, feels offbalance/ weakness.     Objective:  Neurological Exam  Physical Exam Physical Examination:   Vitals:   10/21/22 1330  BP: 108/69  Pulse: 69   General Examination: The patient is a very pleasant 62 y.o. female in no acute distress. She appears well-developed and well-nourished and well groomed.   HEENT: Normocephalic, atraumatic, pupils are equal, round and reactive to light, extraocular tracking is well-preserved, very mild facial  masking, no significant nuchal rigidity noted.  Speech is clear without dysarthria, hypophonia or voice tremor.  No lip, neck or jaw tremor.  She feels that she has a tongue tremor but I did not see any involuntary abnormal tongue movements or tremors.  Hearing is grossly intact.  No carotid bruits.  Airway examination reveals mild to moderate mouth dryness, tongue protrudes centrally and palate elevates symmetrically.    Chest: Clear to auscultation without wheezing, rhonchi or crackles noted.  Heart: S1+S2+0, regular and normal without murmurs, rubs or gallops noted.   Abdomen: Soft, non-tender and non-distended.  Extremities: There is 1+ pitting edema in the distal lower extremities bilaterally, mostly around the ankles, left more than right.   Skin: Warm and dry without trophic changes noted.   Musculoskeletal: exam reveals no obvious joint deformities.   Neurologically:  Mental status: The patient is awake, alert and oriented in all 4 spheres. Her immediate and remote memory, attention, language skills and fund of knowledge are appropriate. There is no evidence of aphasia, agnosia, apraxia or anomia. Speech is scant but without any dysarthria or hypophonia or voice tremor. Thought process is linear. Mood is constricted and affect is blunted. Cranial nerves II - XII are as described above under HEENT exam.  Motor exam: Normal bulk, strength and tone is noted. There is no obvious  action or resting tremor.  She has a very mild bilateral postural tremor in both hands, no lower extremity tremor is noted.  On Archimedes spiral drawing she has no significant trembling with either hand, handwriting is not micrographic, mildly tremulous, legible. Fine motor skills and coordination: grossly intact with finger taps, hand movements and rapid alternating patting bilaterally.  Normal foot taps bilaterally, in particular, no decrement in amplitude, no lateralization.  No drift or rebound.  Romberg is  negative. Cerebellar testing: No dysmetria or intention tremor. There is no truncal or gait ataxia.  Normal finger-to-nose, normal heel-to-shin. Reflexes are 2+ throughout including ankles, toes are downgoing bilaterally. Sensory exam: intact to light touch in the upper and lower extremities.  Gait, station and balance: She stands without difficulty, posture is age-appropriate, no significant stooped.  She does hold her head slightly tilted to the left.  She walks without shuffle, no walking aid, preserved arm swing bilaterally.  She has difficulty with tandem walk.   Assessment and Plan:  In summary, ERICIA BRUGGEMAN is a very pleasant 62 y.o.-year old female with an underlying medical history of anemia, arthritis, anxiety, depression, edema, hypertension, headaches, palpitations, hyperlipidemia, sleep apnea, history of seizures, Bell's palsy, diabetes, urticaria, and obesity, who presents for evaluation of her hand tremors of approximately 1-1/2 years duration.  Examination and history are not telltale for essential tremor.  She has no evidence of parkinsonism.  She has a mild postural tremor in both upper extremities.  She is largely reassured today but is advised that there are certain with medications that can exacerbate or even induced tremors.  In her case, this would include Thorazine, Lamictal, Prozac, and recent use of Viibryd.  We talked about common tremor triggers and alleviating factors.  She is advised to stay well-hydrated and well rested, she admits that she does not always drink a lot of water, estimates that she drinks about 4 cups of water per day, she is encouraged to drink about 6 to 8 cups of water per day.  She is encouraged to continue to avoid caffeine and alcohol.  She is advised to try to get enough sleep, 7 to 8 hours.  She does indicate sleeping quite well, she uses a PAP machine and reports compliance.  She is advised that she is already on a beta-blocker which is often used  for tremor reduction.  I would not recommend any additional medication for tremor control, in particular, I would not recommend a trial of primidone because it can exacerbate balance and she is already indicating difficulty with her balance.  She had difficulty with tandem walk today.  She is on multiple psychotropic medications and is advised to talk to her current prescribers about the possibility of streamlining her medication regimen.  She is also advised that sometimes side effects from medication such as Viibryd and Thorazine take a while to improve.  She has recently stopped taking the Viibryd but reports that she has not been taking Thorazine.  I would recommend that she stop it altogether if possible.  She is advised to follow-up with her current providers including your office.  She does not need a scheduled follow-up in this office, I did not see any focal neurological findings on exam.  I answered all the questions today and the patient and her husband were in agreement.   Thank you very much for allowing me to participate in the care of this nice patient. If I can be of any further assistance to  you please do not hesitate to call me at [phone].  Sincerely,   Star Age, MD, PhD

## 2022-10-21 NOTE — Patient Instructions (Addendum)
And prior use of you have a mild tremor of both hands. I suspect that your tremor is likely medication induced. Medications in your case, which can cause or exacerbate tremors include:  Thorazine Prozac Viibryd Lamictal  I do not see any signs or symptoms of parkinson's like disease or what we call parkinsonism.   For your tremor, I would not recommend any new medications at this time. As discussed, you are already on a beta blocker, which is a medication that can treat certain tremors.   Please talk to your current providers about possible medication side effects.  I would not recommend a medication for tremor, called primidone, in your case as it can be sedating and cause impairment of kidney function and affect balance. I would not favor this medication for you.   Please remember, that any kind of tremor may be exacerbated by anxiety, anger, nervousness, excitement, dehydration, sleep deprivation, thyroid dysfunction, by caffeine, and low blood sugar values or blood sugar fluctuations. Some medications can exacerbate tremors, this includes certain asthma or COPD medications and certain antidepressants.   Please try to hydrate better with water, I recommend 6-8 cups of water per day.   Please try to get 7-8 hours of sleep on any given night.   We do not need to schedule a follow up appointment.

## 2022-10-22 ENCOUNTER — Ambulatory Visit (INDEPENDENT_AMBULATORY_CARE_PROVIDER_SITE_OTHER): Payer: Medicare Other | Admitting: Clinical

## 2022-10-22 DIAGNOSIS — F411 Generalized anxiety disorder: Secondary | ICD-10-CM

## 2022-10-22 DIAGNOSIS — F332 Major depressive disorder, recurrent severe without psychotic features: Secondary | ICD-10-CM | POA: Diagnosis not present

## 2022-10-22 NOTE — Progress Notes (Signed)
                Kayani Rapaport, LCSW 

## 2022-10-22 NOTE — Progress Notes (Signed)
[facility] Counselor/Therapist Progress Note  Patient ID: Abigail Figueroa, MRN: [id number],    Date: 10/22/2022  Time Spent:  1:34pm - 2:20pm : 46 minutes  Treatment Type: Individual Therapy  Reported Symptoms: Patient reported depressed mood  Mental Status Exam: Appearance:  Neat and Well Groomed     Behavior: Appropriate  Motor: Normal  Speech/Language:  Clear and Coherent  Affect: Appropriate  Mood: depressed  Thought process: normal  Thought content:   WNL  Sensory/Perceptual disturbances:   WNL  Orientation: oriented to person, place, and situation  Attention: Good  Concentration: Good  Memory: WNL  Fund of knowledge:  Good  Insight:   Good  Judgment:  Good  Impulse Control: Good   Risk Assessment: Danger to Self:  No Patient denied current suicidal ideation  Self-injurious Behavior: No Danger to Others: No Patient denied current homicidal ideation Duty to Warn:no Physical Aggression / Violence:No  Access to Firearms a concern: No  Gang Involvement:No   Subjective: Patient stated, "its been going ok" in response to events since last session.  Patient reported her physicians are trying to determine the cause of tremors patient has been experiencing. Patient reported she discussed the tremors with her providers and patient reported her psychiatrist indicated antidepressants could cause tremors. Patient reported her psychiatrist reduced patient's Prozac dosage to 20 mg and started patient on mirtazapine 7.5 mg at night. Patient reported her kidneys are currently stable and patient stated, "I'm doing good with that". Patient reported she has decided not to proceed with TMS treatment and reported her daughter has concerns about TMS. Patient stated, "everything else has been going ok". Patient stated, "I'm just stressed out about my health". Patient reported she thinks about the state of their home and repairs needed are current stressors. Patient reported  feeling angry and sad in response to the state of their home. Patient stated, "I get tired mentally".  Patient reported they are not able to walk on their deck due to safety concerns. Patient reported she plans to call resources discussed during today's session. Patient stated, "this is good" in response to homework assignment to complete a gratitude journal.   Interventions: Cognitive Behavioral Therapy and task centered.  Clinician conducted session via caregility video from clinician's home office. Patient provided verbal consent to proceed with telehealth session and is aware of limitations of telephone or video visits. Patient participated in session from patient's home. Discussed patient's concerns related to her physical health and recent changes in psychotropic medications. Discussed status of psychiatrist's recommendation for TMS and patient's concerns regarding TMS. Explored and identified stressors/triggers for changes in mood. Assisted patient in problem solving and identifying resources for home repairs, such as, [employer], [address], [address], and [place]. Provided active and reflective listening, validation. Clinician requested patient complete a gratitude journal and identify at least 5 entries per day.    Collaboration of Care: not required at this time   Diagnosis:  Major Depressive Disorder, recurrent, severe without psychotic features   Generalized anxiety disorder   Plan: Patient is to utilize [employer] Therapy, thought re-framing, relaxation techniques, mindfulness, effective communication, and coping strategies to decrease symptoms associated with Major Depressive Disorder and Generalized Anxiety Disorder. Frequency: bi-weekly  Modality: individual      Long-term goal:   Patient stated, "I want to see a change in the anxiety".      Reduce overall level, frequency, and intensity of the symptoms of anxiety and depression as evidenced by decreased sadness, feelings of  anxiety,  feeling "lost", lack of energy, difficulty falling and staying asleep, changes in concentration, psychomotor retardation, rapid heart rate, excessive worry, restlessness, feeling jittery, and feeling on edge from 5 to 6 days per week to 0 to 1 days per week per patient reported for at least 3 consecutive months.    Target Date: 02/06/23  Progress: progressing    Short-term goal:  Develop coping strategies to utilize in response to symptoms of depression/anxiety and stressors   Target Date: 02/06/23  Progress: progressing    Verbally express patient's thoughts and feelings to others and utilize effective communication strategies when expressing patient's thoughts/feelings   Target Date: 02/06/23  Progress: progressing    Verbalize an understanding of the relationship between symptoms of anxiety and the impact on patient's thought patterns and behaviors   Target Date: 02/06/23  Progress: progressing    Doree Barthel, LCSW

## 2022-10-26 DIAGNOSIS — D3502 Benign neoplasm of left adrenal gland: Secondary | ICD-10-CM | POA: Diagnosis not present

## 2022-10-26 DIAGNOSIS — D3501 Benign neoplasm of right adrenal gland: Secondary | ICD-10-CM | POA: Diagnosis not present

## 2022-10-29 ENCOUNTER — Ambulatory Visit (INDEPENDENT_AMBULATORY_CARE_PROVIDER_SITE_OTHER): Payer: Medicare Other | Admitting: Clinical

## 2022-10-29 DIAGNOSIS — F332 Major depressive disorder, recurrent severe without psychotic features: Secondary | ICD-10-CM | POA: Diagnosis not present

## 2022-10-29 DIAGNOSIS — F411 Generalized anxiety disorder: Secondary | ICD-10-CM | POA: Diagnosis not present

## 2022-10-29 NOTE — Progress Notes (Signed)
                Natasha Burda, LCSW 

## 2022-10-29 NOTE — Progress Notes (Signed)
[facility] Counselor/Therapist Progress Note  Patient ID: Abigail Figueroa, MRN: [id number],    Date: 10/29/2022  Time Spent: 12:33pm - 1:27pm : 54 minutes   Treatment Type: Individual Therapy  Reported Symptoms: Patient reported she has been "feeling more down" and "staying to myself".   Mental Status Exam: Appearance:  Neat and Well Groomed     Behavior: Appropriate  Motor: Normal  Speech/Language:  Clear and Coherent  Affect: Appropriate  Mood: normal  Thought process: normal  Thought content:   WNL  Sensory/Perceptual disturbances:   WNL  Orientation: oriented to person, place, and situation  Attention: Good  Concentration: Good  Memory: WNL  Fund of knowledge:  Good  Insight:   Good  Judgment:  Good  Impulse Control: Good   Risk Assessment: Danger to Self:  No Patient denied current suicidal ideation  Self-injurious Behavior: No Danger to Others: No Patient denied current homicidal ideation Duty to Warn:no Physical Aggression / Violence:No  Access to Firearms a concern: No  Gang Involvement:No   Subjective: Patient stated, "its going pretty good".  Patient reported she did not take the medication previously prescribed by her psychiatrist due to potential interactions with patient's other medications. Patient reported during today's appointment with her psychiatrist, the psychiatrist prescribed Aripiprazole 2 mg. Patient reported a decrease in tremors since patient's dosage of Prozac was decreased. Patient reported she has been "feeling more down" and "staying to myself". Patient stated, "I'm pretty good today" in response to patient's current mood. Patient reported her husband and grandson planted a garden. Patient reported she previously helped plant their garden and enjoyed gardening. Patient reported she is no longer involved in their garden. Patient reported she has not expressed her feelings related to participating in their garden for fear of hurting  her grandson's feelings. Patient stated,  "I'm going to say something" and reported she plans to help her grandson with the garden in the morning. Patient reported she enjoys sitting in her front yard under a shade tree and walking in her front yard.  Patient stated, "its going pretty good" in response to patient's gratitude journal. Patient reported she enjoys spending time with her granddaughter and reported entries in her gratitude journal were related to time with her granddaughter  Patient stated, "it was easy" in response to completing her gratitude journal. Patient stated, "I'm trying to ignore things that get under my skin" and stated, "I feel like I'm improving with that".    Interventions: Cognitive Behavioral Therapy. Clinician conducted session via caregility video from clinician's home office. Patient provided verbal consent to proceed with telehealth session and is aware of limitations of telephone or video visits. Patient participated in session from patient's home. Discussed recent changes in patient's medication and the impact on patient's mood. Assessed patient's current mood.  Explored the impact of gardening and spending time outside on patient's mood. Explored barriers to patient expressing her feelings related to patient's participation in the garden. Explored activities patient enjoys that benefits patient's mood, such as, planting flowers in flower pots and re-engaging in their garden. Reviewed patient's gratitude journal. Provided psycho education related to self care, cognitive distortions, cognitive restructuring, and re framing negative thoughts/vocabulary and replacing with positive thoughts/vocabulary. Challenged cognitive distortions. Provided active and reflective listening, validation. Clinician requested patient continue gratitude journal.    Collaboration of Care: not required at this time   Diagnosis:  Major Depressive Disorder, recurrent, severe without psychotic features    Generalized anxiety disorder   Plan:  Patient is to utilize [employer] Therapy, thought re-framing, relaxation techniques, mindfulness, effective communication, and coping strategies to decrease symptoms associated with Major Depressive Disorder and Generalized Anxiety Disorder. Frequency: bi-weekly  Modality: individual      Long-term goal:   Patient stated, "I want to see a change in the anxiety".      Reduce overall level, frequency, and intensity of the symptoms of anxiety and depression as evidenced by decreased sadness, feelings of anxiety, feeling "lost", lack of energy, difficulty falling and staying asleep, changes in concentration, psychomotor retardation, rapid heart rate, excessive worry, restlessness, feeling jittery, and feeling on edge from 5 to 6 days per week to 0 to 1 days per week per patient reported for at least 3 consecutive months.    Target Date: 02/06/23  Progress: progressing    Short-term goal:  Develop coping strategies to utilize in response to symptoms of depression/anxiety and stressors   Target Date: 02/06/23  Progress: progressing    Verbally express patient's thoughts and feelings to others and utilize effective communication strategies when expressing patient's thoughts/feelings   Target Date: 02/06/23  Progress: progressing    Verbalize an understanding of the relationship between symptoms of anxiety and the impact on patient's thought patterns and behaviors   Target Date: 02/06/23  Progress: progressing     Doree Barthel, LCSW

## 2022-11-08 ENCOUNTER — Ambulatory Visit: Payer: Self-pay

## 2022-11-08 NOTE — Telephone Encounter (Signed)
Chief Complaint: Diarrhea Symptoms: Watery stools  Frequency: onset Saturday noting at least 30 watery stools since onset. Pertinent Negatives: Patient denies abdominal pain Disposition: [] ED /[] Urgent Care (no appt availability in office) / [x] Appointment(In office/virtual)/ []  [facility]/ [] Home Care/ [] Refused Recommended Disposition /[] [facility]/ []  Follow-up with PCP Additional Notes: Patient reports having at least 10 diarrhea stools in the last 24 hours. Patient stated that the watery stools have gotten better today, only reporting 1 episode today. Patient denies abdominal pain at this time. Patient stated she had a headache on Saturday and the diarrhea started shortly after taking medicine for a migraine. Patient states she felt dizzy on Sunday but today she just feels tired. Advised patient she needs to be evaluated. Patient is agreeable but only wants to be scheduled with her provider. Patient has been scheduled with PCP at the first available appointment on 11/10/22 at 1600.  Summary: Diarrhea   Patient has been having diarrhea since Saturday evening, per patient no abdominal pain but feeling a little weak & worried about dehydration coming on.   Patients call back #: [phone]     Reason for Disposition  [1] SEVERE diarrhea (e.g., 7 or more times / day more than normal) AND [2] present > 24 hours (1 day)  Answer Assessment - Initial Assessment Questions 1. DIARRHEA SEVERITY: "How bad is the diarrhea?" "How many more stools have you had in the past 24 hours than normal?"    - NO DIARRHEA (SCALE 0)   - MILD (SCALE 1-3): Few loose or mushy BMs; increase of 1-3 stools over normal daily number of stools; mild increase in ostomy output.   -  MODERATE (SCALE 4-7): Increase of 4-6 stools daily over normal; moderate increase in ostomy output.   -  SEVERE (SCALE 8-10; OR "WORST POSSIBLE"): Increase of 7 or more stools daily over normal; moderate increase in  ostomy output; incontinence.     10 -11 2. ONSET: "When did the diarrhea begin?"      Saturday 3. BM CONSISTENCY: "How loose or watery is the diarrhea?"      Watery 4. VOMITING: "Are you also vomiting?" If Yes, ask: "How many times in the past 24 hours?"      No 5. ABDOMEN PAIN: "Are you having any abdomen pain?" If Yes, ask: "What does it feel like?" (e.g., crampy, dull, intermittent, constant)      No 6. ABDOMEN PAIN SEVERITY: If present, ask: "How bad is the pain?"  (e.g., Scale 1-10; mild, moderate, or severe)   - MILD (1-3): doesn't interfere with normal activities, abdomen soft and not tender to touch    - MODERATE (4-7): interferes with normal activities or awakens from sleep, abdomen tender to touch    - SEVERE (8-10): excruciating pain, doubled over, unable to do any normal activities       No 7. ORAL INTAKE: If vomiting, "Have you been able to drink liquids?" "How much liquids have you had in the past 24 hours?"     I'm not vomiting and I have been drinking fliuds 8. HYDRATION: "Any signs of dehydration?" (e.g., dry mouth [not just dry lips], too weak to stand, dizziness, new weight loss) "When did you last urinate?"     I feel a little dizzy  9. EXPOSURE: "Have you traveled to a foreign country recently?" "Have you been exposed to anyone with diarrhea?" "Could you have eaten any food that was spoiled?"     No not that  I can think of. 10. ANTIBIOTIC USE: "Are you taking antibiotics now or have you taken antibiotics in the past 2 months?"       No 11. OTHER SYMPTOMS: "Do you have any other symptoms?" (e.g., fever, blood in stool)       Dizzy and tired. I had a headache on Saturday but it has since stopped.  Protocols used: [facility]

## 2022-11-10 ENCOUNTER — Encounter: Payer: Self-pay | Admitting: Physician Assistant

## 2022-11-10 ENCOUNTER — Ambulatory Visit: Payer: Medicare Other | Admitting: Dietician

## 2022-11-10 ENCOUNTER — Ambulatory Visit: Payer: Medicare Other | Admitting: Physician Assistant

## 2022-11-10 VITALS — BP 118/68 | HR 72 | Resp 13 | Ht 63.0 in | Wt 172.4 lb

## 2022-11-10 DIAGNOSIS — R5383 Other fatigue: Secondary | ICD-10-CM

## 2022-11-10 DIAGNOSIS — R110 Nausea: Secondary | ICD-10-CM | POA: Diagnosis not present

## 2022-11-10 DIAGNOSIS — R531 Weakness: Secondary | ICD-10-CM

## 2022-11-10 DIAGNOSIS — R159 Full incontinence of feces: Secondary | ICD-10-CM

## 2022-11-10 LAB — POCT URINALYSIS DIPSTICK
Bilirubin, UA: POSITIVE
Blood, UA: NEGATIVE
Glucose, UA: NEGATIVE
Leukocytes, UA: NEGATIVE
Nitrite, UA: NEGATIVE
Protein, UA: NEGATIVE
Spec Grav, UA: 1.02 (ref 1.010–1.025)
Urobilinogen, UA: 0.2 E.U./dL
pH, UA: 7.5 (ref 5.0–8.0)

## 2022-11-10 NOTE — Progress Notes (Unsigned)
Established patient visit   Patient: Abigail Figueroa   DOB: April 05, 1961   62 y.o. Female  MRN: [id number] Visit Date: 11/10/2022  Today's healthcare provider: [name], PA-C   No chief complaint on file.  Subjective    HPI  ***  Medications: Outpatient Medications Prior to Visit  Medication Sig   amLODipine (NORVASC) 5 MG tablet TAKE 1 TABLET (5 MG TOTAL) BY MOUTH DAILY. PLEASE KEEP SCHEDULED APPOINTMENT   Azelastine HCl 0.15 % SOLN PLACE 2 SPRAYS INTO BOTH NOSTRILS 2 (TWO) TIMES DAILY (Patient taking differently: Place 2 sprays into both nostrils 2 (two) times daily. Taking as needed)   BOTOX 100 UNITS SOLR injection Inject into the muscle every 3 (three) months.    busPIRone (BUSPAR) 15 MG tablet TAKE 1 TABLET BY MOUTH TWICE A DAY   calcitRIOL (ROCALTROL) 0.25 MCG capsule Take 0.25 mcg by mouth daily.   carbamazepine (TEGRETOL-XR) 400 MG 12 hr tablet TAKE 1 TABLET BY MOUTH TWICE A DAY   chlorproMAZINE (THORAZINE) 25 MG tablet Take 50 mg by mouth 2 (two) times daily as needed.   chlorzoxazone (PARAFON) 500 MG tablet Take by mouth 4 (four) times daily as needed for muscle spasms.   clotrimazole-betamethasone (LOTRISONE) cream Apply 1 Application topically 2 (two) times daily.   diclofenac Sodium (VOLTAREN) 1 % GEL Apply topically as needed.   EMGALITY 120 MG/ML SOAJ Inject into the skin as needed.   EPINEPHrine 0.3 mg/0.3 mL IJ SOAJ injection Inject into the muscle.   ezetimibe (ZETIA) 10 MG tablet Take 1 tablet (10 mg total) by mouth daily.   fluocinolone (SYNALAR) 0.01 % external solution Apply topically as needed.   FLUoxetine (PROZAC) 10 MG capsule Take 40 mg by mouth daily.   fluticasone (FLONASE) 50 MCG/ACT nasal spray Place 1-2 sprays into both nostrils as needed for allergies or rhinitis.   furosemide (LASIX) 40 MG tablet Take 1 tablet (40 mg total) by mouth daily.   lamoTRIgine (LAMICTAL) 100 MG tablet Take 100 mg by mouth 2 (two) times daily.   Lancets  (ONETOUCH ULTRASOFT) lancets Test fasting each morning and 2 hours before supper. Retest if having hypoglycemic symptoms. (Patient taking differently: 1 each by Other route as needed for other. Test fasting each morning and 2 hours before supper. Retest if having hypoglycemic symptoms.)   loratadine (CLARITIN) 10 MG tablet Take 1 tablet (10 mg total) by mouth in the morning and at bedtime.   metoprolol tartrate (LOPRESSOR) 100 MG tablet TAKE 1 TABLET BY MOUTH TWICE A DAY   ONETOUCH VERIO test strip TEST FASTING GLUCOSE DAILY AS DIRECTED   promethazine (PHENERGAN) 25 MG tablet Take 25 mg by mouth every 4 (four) hours as needed.   rosuvastatin (CRESTOR) 20 MG tablet TAKE 1 TABLET BY MOUTH EVERY DAY   Semaglutide,0.25 or 0.5MG /DOS, (OZEMPIC, 0.25 OR 0.5 MG/DOSE,) 2 MG/3ML SOPN Inject 0.25 mg into the skin once a week. (Patient taking differently: Inject 0.5 mg into the skin once a week.)   tretinoin (RETIN-A) 0.025 % cream Apply topically at bedtime.   triamcinolone ointment (KENALOG) 0.1 % Apply 1 Application topically 2 (two) times daily.   No facility-administered medications prior to visit.    Review of Systems  {Labs  Heme  Chem  Endocrine  Serology  Results Review (optional):23779}   Objective    LMP  (LMP Unknown)  {Show previous vital signs (optional):23777}  Physical Exam Constitutional:      General: She is awake.  Appearance: She is well-developed.  HENT:     Head: Normocephalic.  Eyes:     Conjunctiva/sclera: Conjunctivae normal.  Cardiovascular:     Rate and Rhythm: Normal rate and regular rhythm.     Heart sounds: Normal heart sounds.  Pulmonary:     Effort: Pulmonary effort is normal.  Abdominal:     General: Abdomen is flat.     Palpations: Abdomen is soft.     Tenderness: There is no abdominal tenderness. There is no guarding.  Skin:    General: Skin is warm.  Neurological:     Mental Status: She is alert and oriented to person, place, and time.   Psychiatric:        Attention and Perception: Attention normal.        Mood and Affect: Mood normal.        Speech: Speech normal.        Behavior: Behavior is cooperative.      No results found for any visits on 11/10/22.  Assessment & Plan     ***  No follow-ups on file.      I, [name], PA-C have reviewed all documentation for this visit. The documentation on  11/10/22   for the exam, diagnosis, procedures, and orders are all accurate and complete.  [name], PA-C [facility] [address] [place], [place], [address] Office: [phone] Fax: [phone]   [facility]

## 2022-11-11 ENCOUNTER — Encounter: Payer: Self-pay | Admitting: Physician Assistant

## 2022-11-11 DIAGNOSIS — R5383 Other fatigue: Secondary | ICD-10-CM | POA: Diagnosis not present

## 2022-11-11 DIAGNOSIS — D631 Anemia in chronic kidney disease: Secondary | ICD-10-CM | POA: Diagnosis not present

## 2022-11-11 DIAGNOSIS — G43719 Chronic migraine without aura, intractable, without status migrainosus: Secondary | ICD-10-CM | POA: Diagnosis not present

## 2022-11-11 DIAGNOSIS — R531 Weakness: Secondary | ICD-10-CM | POA: Diagnosis not present

## 2022-11-11 DIAGNOSIS — N184 Chronic kidney disease, stage 4 (severe): Secondary | ICD-10-CM | POA: Diagnosis not present

## 2022-11-11 DIAGNOSIS — M791 Myalgia, unspecified site: Secondary | ICD-10-CM | POA: Diagnosis not present

## 2022-11-11 DIAGNOSIS — N1832 Chronic kidney disease, stage 3b: Secondary | ICD-10-CM | POA: Diagnosis not present

## 2022-11-11 DIAGNOSIS — M542 Cervicalgia: Secondary | ICD-10-CM | POA: Diagnosis not present

## 2022-11-11 DIAGNOSIS — D649 Anemia, unspecified: Secondary | ICD-10-CM | POA: Diagnosis not present

## 2022-11-11 DIAGNOSIS — D3501 Benign neoplasm of right adrenal gland: Secondary | ICD-10-CM | POA: Diagnosis not present

## 2022-11-11 DIAGNOSIS — D3502 Benign neoplasm of left adrenal gland: Secondary | ICD-10-CM | POA: Diagnosis not present

## 2022-11-12 ENCOUNTER — Ambulatory Visit: Payer: Medicare Other | Admitting: Clinical

## 2022-11-12 DIAGNOSIS — I130 Hypertensive heart and chronic kidney disease with heart failure and stage 1 through stage 4 chronic kidney disease, or unspecified chronic kidney disease: Secondary | ICD-10-CM | POA: Diagnosis not present

## 2022-11-12 DIAGNOSIS — Z87891 Personal history of nicotine dependence: Secondary | ICD-10-CM | POA: Diagnosis not present

## 2022-11-12 DIAGNOSIS — Z885 Allergy status to narcotic agent status: Secondary | ICD-10-CM | POA: Diagnosis not present

## 2022-11-12 DIAGNOSIS — N184 Chronic kidney disease, stage 4 (severe): Secondary | ICD-10-CM | POA: Diagnosis not present

## 2022-11-12 DIAGNOSIS — G4733 Obstructive sleep apnea (adult) (pediatric): Secondary | ICD-10-CM | POA: Diagnosis not present

## 2022-11-12 DIAGNOSIS — E860 Dehydration: Secondary | ICD-10-CM | POA: Diagnosis not present

## 2022-11-12 DIAGNOSIS — I129 Hypertensive chronic kidney disease with stage 1 through stage 4 chronic kidney disease, or unspecified chronic kidney disease: Secondary | ICD-10-CM | POA: Diagnosis not present

## 2022-11-12 DIAGNOSIS — E1122 Type 2 diabetes mellitus with diabetic chronic kidney disease: Secondary | ICD-10-CM | POA: Diagnosis not present

## 2022-11-12 DIAGNOSIS — R197 Diarrhea, unspecified: Secondary | ICD-10-CM | POA: Diagnosis not present

## 2022-11-12 DIAGNOSIS — R9431 Abnormal electrocardiogram [ECG] [EKG]: Secondary | ICD-10-CM | POA: Diagnosis not present

## 2022-11-12 DIAGNOSIS — Z79899 Other long term (current) drug therapy: Secondary | ICD-10-CM | POA: Diagnosis not present

## 2022-11-12 DIAGNOSIS — E785 Hyperlipidemia, unspecified: Secondary | ICD-10-CM | POA: Diagnosis not present

## 2022-11-12 DIAGNOSIS — N189 Chronic kidney disease, unspecified: Secondary | ICD-10-CM | POA: Diagnosis not present

## 2022-11-12 DIAGNOSIS — R251 Tremor, unspecified: Secondary | ICD-10-CM | POA: Diagnosis not present

## 2022-11-12 DIAGNOSIS — D631 Anemia in chronic kidney disease: Secondary | ICD-10-CM | POA: Diagnosis not present

## 2022-11-12 DIAGNOSIS — N179 Acute kidney failure, unspecified: Secondary | ICD-10-CM | POA: Diagnosis not present

## 2022-11-14 LAB — URINE CULTURE

## 2022-11-16 ENCOUNTER — Telehealth: Payer: Self-pay | Admitting: Cardiovascular Disease

## 2022-11-16 ENCOUNTER — Other Ambulatory Visit: Payer: Self-pay

## 2022-11-16 DIAGNOSIS — N179 Acute kidney failure, unspecified: Secondary | ICD-10-CM | POA: Diagnosis not present

## 2022-11-16 MED ORDER — AMLODIPINE BESYLATE 5 MG PO TABS: ORAL_TABLET | ORAL | 1 refills | Status: DC

## 2022-11-16 NOTE — Telephone Encounter (Signed)
Patient states she has been taking amlodipine 5 mg (1.5 tabs) 12.5 mg total daily for a long time. Her last refill did not reflect this so she did not get enough pills and now she is out.    Last visit with Marylene Land just states continue on current medications. 10/19/22. It looks like when patient requested refill in May it was changed. I don't see any note for change only on the medication list. Is she supposed to take 5 mg daily or 12.5 mg daily/ Please clarify dose. She is completley out of medication.

## 2022-11-16 NOTE — Telephone Encounter (Signed)
If patient has been taking 1.5 tablets of a 5 mg amlodipine pill, the dose is 7.5 mg of amlodipine not 1.5 mg of amlodipine.

## 2022-11-16 NOTE — Telephone Encounter (Signed)
Pt c/o medication issue:  1. Name of Medication:   amLODipine (NORVASC) 5 MG tablet   2. How are you currently taking this medication (dosage and times per day)?  1.5 tablet per daily  3. Are you having a reaction (difficulty breathing--STAT)?   No  4. What is your medication issue?   Patient stated she is completely out of this medication and has been taking 1.5 tablet per day and her currently prescription reads 1 tablet by mouth per day.

## 2022-11-16 NOTE — Telephone Encounter (Signed)
Spoke with pt.  Patient is to take 1.5mg  of amlodipine per Dr. Tresa Endo. Medication sent

## 2022-11-17 ENCOUNTER — Other Ambulatory Visit: Payer: Self-pay | Admitting: Cardiovascular Disease

## 2022-11-17 ENCOUNTER — Inpatient Hospital Stay: Payer: Medicare Other

## 2022-11-17 ENCOUNTER — Inpatient Hospital Stay: Payer: Medicare Other | Attending: Oncology

## 2022-11-17 DIAGNOSIS — D631 Anemia in chronic kidney disease: Secondary | ICD-10-CM | POA: Diagnosis not present

## 2022-11-17 DIAGNOSIS — N184 Chronic kidney disease, stage 4 (severe): Secondary | ICD-10-CM | POA: Diagnosis not present

## 2022-11-17 LAB — HEMOGLOBIN AND HEMATOCRIT, BLOOD
HCT: 31.3 % — ABNORMAL LOW (ref 36.0–46.0)
Hemoglobin: 10.3 g/dL — ABNORMAL LOW (ref 12.0–15.0)

## 2022-11-17 NOTE — Progress Notes (Signed)
Hgb is 10.3 today; Hold retacrit

## 2022-11-19 ENCOUNTER — Ambulatory Visit: Payer: Medicare Other | Admitting: Clinical

## 2022-11-29 DIAGNOSIS — Z1211 Encounter for screening for malignant neoplasm of colon: Secondary | ICD-10-CM | POA: Diagnosis not present

## 2022-11-29 DIAGNOSIS — D3501 Benign neoplasm of right adrenal gland: Secondary | ICD-10-CM | POA: Diagnosis not present

## 2022-11-29 DIAGNOSIS — D3502 Benign neoplasm of left adrenal gland: Secondary | ICD-10-CM | POA: Diagnosis not present

## 2022-11-29 DIAGNOSIS — R109 Unspecified abdominal pain: Secondary | ICD-10-CM | POA: Diagnosis not present

## 2022-11-29 DIAGNOSIS — K529 Noninfective gastroenteritis and colitis, unspecified: Secondary | ICD-10-CM | POA: Diagnosis not present

## 2022-11-29 DIAGNOSIS — K5909 Other constipation: Secondary | ICD-10-CM | POA: Diagnosis not present

## 2022-11-29 DIAGNOSIS — G8929 Other chronic pain: Secondary | ICD-10-CM | POA: Diagnosis not present

## 2022-12-02 ENCOUNTER — Ambulatory Visit: Payer: Medicare Other | Admitting: Allergy & Immunology

## 2022-12-03 ENCOUNTER — Ambulatory Visit (INDEPENDENT_AMBULATORY_CARE_PROVIDER_SITE_OTHER): Payer: Medicare Other | Admitting: Clinical

## 2022-12-03 DIAGNOSIS — F411 Generalized anxiety disorder: Secondary | ICD-10-CM | POA: Diagnosis not present

## 2022-12-03 DIAGNOSIS — F332 Major depressive disorder, recurrent severe without psychotic features: Secondary | ICD-10-CM

## 2022-12-03 NOTE — Progress Notes (Addendum)
[facility] Counselor/Therapist Progress Note  Patient ID: Abigail Figueroa, MRN: [id number],    Date: 12/03/2022  Time Spent: 12:32pm - 1:27pm : 55 minutes  Treatment Type: Individual Therapy  Reported Symptoms: Patient reported depressed mood at times and reported fatigue.   Mental Status Exam: Appearance:  Neat and Well Groomed     Behavior: Appropriate  Motor: Normal  Speech/Language:  Clear and Coherent  Affect: Appropriate  Mood: normal  Thought process: normal  Thought content:   WNL  Sensory/Perceptual disturbances:   WNL  Orientation: oriented to person, place, and situation  Attention: Good  Concentration: Good  Memory: WNL  Fund of knowledge:  Good  Insight:   Good  Judgment:  Good  Impulse Control: Good   Risk Assessment: Danger to Self:  No Patient denied current suicidal ideation  Self-injurious Behavior: No Danger to Others: No Patient denied current homicidal ideation Duty to Warn:no Physical Aggression / Violence:No  Access to Firearms a concern: No  Gang Involvement:No   Subjective: Patient reported she was recently sent to the hospital by patient's provider due to lab results. Patient reported she was advised she was dehydrated and the dehydration impacted the outcome of patient's lab results. Patient stated, "I feel better". Patient stated, "its stressful" in response to medical conditions.  Patient stated, "it's been pretty good" in response to patient's mood since last session. Patient reported her mood is depressed at times. Patient reported a decrease in frequency of depressed mood and the length of duration has decreased since she started a new medication. Patient stated, "its ok" in response to current mood. Patient reported she experiences tremors and reported tremors are a trigger for changes in patient's mood. Patient stated, "I haven't" in response to completion of patient's gratitude journal. Patient stated, "I don't know" in  response to barriers to completing gratitude journal. Patient reported she feels decreased motivation is a barrier. Patient reported she experiences difficulty as it relates to her brother's behaviors and receiving her brother's spiritual message as patient's pastor. Patient reported her husband's remarks regarding patient's brother is a trigger for changes in patient's mood.   Interventions: Cognitive Behavioral Therapy. Reviewed events since last session and discussed patient's recent hospitalization. Assessed patient's mood since last session and patient's current mood. Assessed intensity and frequency of depressive symptoms. Assisted patient in exploring and identifying triggers for depressed mood. Reviewed patient's homework to complete gratitude journal and explored barriers to journaling. Discussed breaking tasks into smaller steps, such as, identifying one example of gratitude each day. Provided psycho education related to gratitude exercises and the impact on mood. Assisted patient in exploring patient's thoughts and feelings as it relates to her brother's behaviors and the impact on patient's spirituality. Clinician requested patient continue gratitude journal.    Collaboration of Care: not required at this time   Diagnosis:  Major Depressive Disorder, recurrent, severe without psychotic features   Generalized anxiety disorder   Plan: Patient is to utilize [employer] Therapy, thought re-framing, relaxation techniques, mindfulness, effective communication, and coping strategies to decrease symptoms associated with Major Depressive Disorder and Generalized Anxiety Disorder. Frequency: bi-weekly  Modality: individual      Long-term goal:   Patient stated, "I want to see a change in the anxiety".      Reduce overall level, frequency, and intensity of the symptoms of anxiety and depression as evidenced by decreased sadness, feelings of anxiety, feeling "lost", lack of energy,  difficulty falling and staying asleep, changes in concentration,  psychomotor retardation, rapid heart rate, excessive worry, restlessness, feeling jittery, and feeling on edge from 5 to 6 days per week to 0 to 1 days per week per patient reported for at least 3 consecutive months.    Target Date: 02/06/23  Progress: progressing    Short-term goal:  Develop coping strategies to utilize in response to symptoms of depression/anxiety and stressors   Target Date: 02/06/23  Progress: progressing    Verbally express patient's thoughts and feelings to others and utilize effective communication strategies when expressing patient's thoughts/feelings   Target Date: 02/06/23  Progress: progressing    Verbalize an understanding of the relationship between symptoms of anxiety and the impact on patient's thought patterns and behaviors   Target Date: 02/06/23  Progress: progressing     Doree Barthel, LCSW

## 2022-12-03 NOTE — Progress Notes (Signed)
                 , LCSW 

## 2022-12-06 ENCOUNTER — Ambulatory Visit (INDEPENDENT_AMBULATORY_CARE_PROVIDER_SITE_OTHER): Payer: Medicare Other

## 2022-12-06 VITALS — Ht 63.0 in | Wt 172.0 lb

## 2022-12-06 DIAGNOSIS — Z0000 Encounter for general adult medical examination without abnormal findings: Secondary | ICD-10-CM

## 2022-12-06 DIAGNOSIS — Z0110 Encounter for examination of ears and hearing without abnormal findings: Secondary | ICD-10-CM

## 2022-12-06 NOTE — Patient Instructions (Signed)
Abigail Figueroa , Thank you for taking time to come for your Medicare Wellness Visit. I appreciate your ongoing commitment to your health goals. Please review the following plan we discussed and let me know if I can assist you in the future.   Referrals/Orders/Follow-Ups/Clinician Recommendations: referral to audiology  This is a list of the screening recommended for you and due dates:  Health Maintenance  Topic Date Due   Zoster (Shingles) Vaccine (1 of 2) Never done   DTaP/Tdap/Td vaccine (2 - Td or Tdap) 10/19/2016   Eye exam for diabetics  12/02/2021   Colon Cancer Screening  08/31/2022   COVID-19 Vaccine (5 - 2023-24 season) 09/23/2022   Hemoglobin A1C  10/07/2022   Flu Shot  12/02/2022   Yearly kidney health urinalysis for diabetes  04/08/2023   Complete foot exam   07/05/2023   Mammogram  07/08/2023   Yearly kidney function blood test for diabetes  07/13/2023   Medicare Annual Wellness Visit  12/06/2023   Hepatitis C Screening  Completed   HIV Screening  Completed   HPV Vaccine  Aged Out    Advanced directives: (Copy Requested) Please bring a copy of your health care power of attorney and living will to the office to be added to your chart at your convenience.  Next Medicare Annual Wellness Visit scheduled for next year: Yes 12/12/23 @ 11:15am telephone  Preventive Care 40-64 Years, Female Preventive care refers to lifestyle choices and visits with your health care provider that can promote health and wellness. What does preventive care include? A yearly physical exam. This is also called an annual well check. Dental exams once or twice a year. Routine eye exams. Ask your health care provider how often you should have your eyes checked. Personal lifestyle choices, including: Daily care of your teeth and gums. Regular physical activity. Eating a healthy diet. Avoiding tobacco and drug use. Limiting alcohol use. Practicing safe sex. Taking low-dose aspirin daily starting  at age 74. Taking vitamin and mineral supplements as recommended by your health care provider. What happens during an annual well check? The services and screenings done by your health care provider during your annual well check will depend on your age, overall health, lifestyle risk factors, and family history of disease. Counseling  Your health care provider may ask you questions about your: Alcohol use. Tobacco use. Drug use. Emotional well-being. Home and relationship well-being. Sexual activity. Eating habits. Work and work [employer]. Method of birth control. Menstrual cycle. Pregnancy history. Screening  You may have the following tests or measurements: Height, weight, and BMI. Blood pressure. Lipid and cholesterol levels. These may be checked every 5 years, or more frequently if you are over 103 years old. Skin check. Lung cancer screening. You may have this screening every year starting at age 29 if you have a 30-pack-year history of smoking and currently smoke or have quit within the past 15 years. Fecal occult blood test (FOBT) of the stool. You may have this test every year starting at age 58. Flexible sigmoidoscopy or colonoscopy. You may have a sigmoidoscopy every 5 years or a colonoscopy every 10 years starting at age 61. Hepatitis C blood test. Hepatitis B blood test. Sexually transmitted disease (STD) testing. Diabetes screening. This is done by checking your blood sugar (glucose) after you have not eaten for a while (fasting). You may have this done every 1-3 years. Mammogram. This may be done every 1-2 years. Talk to your health care provider about when you  should start having regular mammograms. This may depend on whether you have a family history of breast cancer. BRCA-related cancer screening. This may be done if you have a family history of breast, ovarian, tubal, or peritoneal cancers. Pelvic exam and Pap test. This may be done every 3 years starting at age 25.  Starting at age 73, this may be done every 5 years if you have a Pap test in combination with an HPV test. Bone density scan. This is done to screen for osteoporosis. You may have this scan if you are at high risk for osteoporosis. Discuss your test results, treatment options, and if necessary, the need for more tests with your health care provider. Vaccines  Your health care provider may recommend certain vaccines, such as: Influenza vaccine. This is recommended every year. Tetanus, diphtheria, and acellular pertussis (Tdap, Td) vaccine. You may need a Td booster every 10 years. Zoster vaccine. You may need this after age 54. Pneumococcal 13-valent conjugate (PCV13) vaccine. You may need this if you have certain conditions and were not previously vaccinated. Pneumococcal polysaccharide (PPSV23) vaccine. You may need one or two doses if you smoke cigarettes or if you have certain conditions. Talk to your health care provider about which screenings and vaccines you need and how often you need them. This information is not intended to replace advice given to you by your health care provider. Make sure you discuss any questions you have with your health care provider. Document Released: 05/16/2015 Document Revised: 01/07/2016 Document Reviewed: 02/18/2015 Elsevier Interactive Patient Education  2017 [employer].    Fall Prevention in the Home Falls can cause injuries. They can happen to people of all ages. There are many things you can do to make your home safe and to help prevent falls. What can I do on the outside of my home? Regularly fix the edges of walkways and driveways and fix any cracks. Remove anything that might make you trip as you walk through a door, such as a raised step or threshold. Trim any bushes or trees on the path to your home. Use bright outdoor lighting. Clear any walking paths of anything that might make someone trip, such as rocks or tools. Regularly check to see if  handrails are loose or broken. Make sure that both sides of any steps have handrails. Any raised decks and porches should have guardrails on the edges. Have any leaves, snow, or ice cleared regularly. Use sand or salt on walking paths during winter. Clean up any spills in your garage right away. This includes oil or grease spills. What can I do in the bathroom? Use night lights. Install grab bars by the toilet and in the tub and shower. Do not use towel bars as grab bars. Use non-skid mats or decals in the tub or shower. If you need to sit down in the shower, use a plastic, non-slip stool. Keep the floor dry. Clean up any water that spills on the floor as soon as it happens. Remove soap buildup in the tub or shower regularly. Attach bath mats securely with double-sided non-slip rug tape. Do not have throw rugs and other things on the floor that can make you trip. What can I do in the bedroom? Use night lights. Make sure that you have a light by your bed that is easy to reach. Do not use any sheets or blankets that are too big for your bed. They should not hang down onto the floor. Have a firm  chair that has side arms. You can use this for support while you get dressed. Do not have throw rugs and other things on the floor that can make you trip. What can I do in the kitchen? Clean up any spills right away. Avoid walking on wet floors. Keep items that you use a lot in easy-to-reach places. If you need to reach something above you, use a strong step stool that has a grab bar. Keep electrical cords out of the way. Do not use floor polish or wax that makes floors slippery. If you must use wax, use non-skid floor wax. Do not have throw rugs and other things on the floor that can make you trip. What can I do with my stairs? Do not leave any items on the stairs. Make sure that there are handrails on both sides of the stairs and use them. Fix handrails that are broken or loose. Make sure that  handrails are as long as the stairways. Check any carpeting to make sure that it is firmly attached to the stairs. Fix any carpet that is loose or worn. Avoid having throw rugs at the top or bottom of the stairs. If you do have throw rugs, attach them to the floor with carpet tape. Make sure that you have a light switch at the top of the stairs and the bottom of the stairs. If you do not have them, ask someone to add them for you. What else can I do to help prevent falls? Wear shoes that: Do not have high heels. Have rubber bottoms. Are comfortable and fit you well. Are closed at the toe. Do not wear sandals. If you use a stepladder: Make sure that it is fully opened. Do not climb a closed stepladder. Make sure that both sides of the stepladder are locked into place. Ask someone to hold it for you, if possible. Clearly mark and make sure that you can see: Any grab bars or handrails. First and last steps. Where the edge of each step is. Use tools that help you move around (mobility aids) if they are needed. These include: Canes. Walkers. Scooters. Crutches. Turn on the lights when you go into a dark area. Replace any light bulbs as soon as they burn out. Set up your furniture so you have a clear path. Avoid moving your furniture around. If any of your floors are uneven, fix them. If there are any pets around you, be aware of where they are. Review your medicines with your doctor. Some medicines can make you feel dizzy. This can increase your chance of falling. Ask your doctor what other things that you can do to help prevent falls. This information is not intended to replace advice given to you by your health care provider. Make sure you discuss any questions you have with your health care provider. Document Released: 02/13/2009 Document Revised: 09/25/2015 Document Reviewed: 05/24/2014 Elsevier Interactive Patient Education  2017 [employer].

## 2022-12-06 NOTE — Progress Notes (Signed)
Subjective:   Abigail Figueroa is a 62 y.o. female who presents for Medicare Annual (Subsequent) preventive examination.  Visit Complete: Virtual  I connected with  Janina Mayo on 12/06/22 by a audio enabled telemedicine application and verified that I am speaking with the correct person using two identifiers.  Patient Location: Home  Provider Location: Home Office  I discussed the limitations of evaluation and management by telemedicine. The patient expressed understanding and agreed to proceed.  Patient Medicare AWV questionnaire was completed by the patient on (not done); I have confirmed that all information answered by patient is correct and no changes since this date.  Review of Systems   Cardiac Risk Factors include: advanced age (>7men, >52 women);diabetes mellitus;dyslipidemia;hypertension;obesity (BMI >30kg/m2)    Objective:    Today's Vitals   12/06/22 1122 12/06/22 1123  Weight: 172 lb (78 kg)   Height: 5\' 3"  (1.6 m)   PainSc:  5    Body mass index is 30.47 kg/m.     12/06/2022   11:51 AM 09/21/2022    2:27 PM 08/11/2022    9:22 AM 08/10/2022    9:00 AM 08/05/2022    1:39 PM 07/27/2022    1:22 PM 07/19/2022   11:23 AM  Advanced Directives  Does Patient Have a Medical Advance Directive? Yes Yes Yes Yes Yes Yes Yes  Type of Estate agent of [place];Living will Living will;Healthcare Power of [employer] Power of [place];Living will Healthcare Power of [place];Living will Healthcare Power of [place];Living will Living will Living will  Copy of Healthcare Power of Attorney in Chart?  No - copy requested  No - copy requested No - copy requested      Current Medications (verified) Outpatient Encounter Medications as of 12/06/2022  Medication Sig   amLODipine (NORVASC) 5 MG tablet Take 1.5 tablet (7.5mg ) by mouth daily.   BOTOX 100 UNITS SOLR injection Inject into the muscle every 3 (three) months.    busPIRone (BUSPAR) 15 MG  tablet TAKE 1 TABLET BY MOUTH TWICE A DAY   carbamazepine (TEGRETOL-XR) 400 MG 12 hr tablet TAKE 1 TABLET BY MOUTH TWICE A DAY   chlorproMAZINE (THORAZINE) 25 MG tablet Take 50 mg by mouth 2 (two) times daily as needed.   chlorzoxazone (PARAFON) 500 MG tablet Take by mouth 4 (four) times daily as needed for muscle spasms.   clotrimazole-betamethasone (LOTRISONE) cream Apply 1 Application topically 2 (two) times daily.   diclofenac Sodium (VOLTAREN) 1 % GEL Apply topically as needed.   EMGALITY 120 MG/ML SOAJ Inject into the skin as needed.   EPINEPHrine 0.3 mg/0.3 mL IJ SOAJ injection Inject into the muscle.   ezetimibe (ZETIA) 10 MG tablet Take 1 tablet (10 mg total) by mouth daily.   fluocinolone (SYNALAR) 0.01 % external solution Apply topically as needed.   FLUoxetine (PROZAC) 10 MG capsule Take 40 mg by mouth daily.   fluticasone (FLONASE) 50 MCG/ACT nasal spray Place 1-2 sprays into both nostrils as needed for allergies or rhinitis.   furosemide (LASIX) 40 MG tablet Take 1 tablet (40 mg total) by mouth daily.   lamoTRIgine (LAMICTAL) 100 MG tablet Take 100 mg by mouth 2 (two) times daily.   Lancets (ONETOUCH ULTRASOFT) lancets Test fasting each morning and 2 hours before supper. Retest if having hypoglycemic symptoms. (Patient taking differently: 1 each by Other route as needed for other. Test fasting each morning and 2 hours before supper. Retest if having hypoglycemic symptoms.)   metoprolol tartrate (  LOPRESSOR) 100 MG tablet TAKE 1 TABLET BY MOUTH TWICE A DAY   ONETOUCH VERIO test strip TEST FASTING GLUCOSE DAILY AS DIRECTED   promethazine (PHENERGAN) 25 MG tablet Take 25 mg by mouth every 4 (four) hours as needed.   rosuvastatin (CRESTOR) 20 MG tablet TAKE 1 TABLET BY MOUTH EVERY DAY   Semaglutide,0.25 or 0.5MG /DOS, (OZEMPIC, 0.25 OR 0.5 MG/DOSE,) 2 MG/3ML SOPN Inject 0.25 mg into the skin once a week. (Patient taking differently: Inject 0.5 mg into the skin once a week.)   tretinoin  (RETIN-A) 0.025 % cream Apply topically at bedtime.   triamcinolone ointment (KENALOG) 0.1 % Apply 1 Application topically 2 (two) times daily.   Azelastine HCl 0.15 % SOLN PLACE 2 SPRAYS INTO BOTH NOSTRILS 2 (TWO) TIMES DAILY (Patient taking differently: Place 2 sprays into both nostrils 2 (two) times daily. Taking as needed)   calcitRIOL (ROCALTROL) 0.25 MCG capsule Take 0.25 mcg by mouth daily.   loratadine (CLARITIN) 10 MG tablet Take 1 tablet (10 mg total) by mouth in the morning and at bedtime.   mirtazapine (REMERON) 7.5 MG tablet Take 7.5 mg by mouth at bedtime.   No facility-administered encounter medications on file as of 12/06/2022.    Allergies (verified) Morphine, Morphine and codeine, Oxycodone, Oxycodone hcl, Aimovig [erenumab-aooe], Augmentin [amoxicillin-pot clavulanate], and Morphine sulfate   History: Past Medical History:  Diagnosis Date   Abnormal stress test    a. 10/2009: Normal myocardial perfusion imaging; b. 08/2015 MV: medium defect of mod severity in mid ant apical region w/ mild HK of the distal inf wall;  c. 08/2015 Cath: nl Cors, EF 60%.   Anemia    Angio-edema    Anxiety    Arthritis    Bell's palsy    Depression    Dermatitis 09/10/2014   Edema    Essential hypertension    Frequent urination    Frequent urination at night    Headache    migraines, gets botox injections   Heart palpitations 10/2011   Event monitor showing sinus tachycardia, PACs with couplets and triplets.   Hx of echocardiogram    a. 10/2009 Echo: showed normal left ventricular function, mild left ventricular hypertrophy, and no significant valve abnormalities.   Hypercholesterolemia    Migraine    Morbid obesity (HCC)    Neuromuscular disorder (HCC)    OSA (obstructive sleep apnea)    has been on continuous positive airway pressure   Seizure disorder (HCC)    Seizures (HCC)    Type II diabetes mellitus (HCC)    Urticaria    Past Surgical History:  Procedure Laterality Date    ABDOMINAL HYSTERECTOMY  1990   without BSO   BACK SURGERY  1900s 2001   x2 with "cage put in"   BILATERAL SALPINGOOPHORECTOMY  1990   CARDIAC CATHETERIZATION N/A 08/06/2015   Procedure: Left Heart Cath and Coronary Angiography;  Surgeon: Lennette Bihari, MD;  Location: [facility];  Service: Cardiovascular;  Laterality: N/A;   CARPAL TUNNEL RELEASE Bilateral    CHOLECYSTECTOMY  1980   COLONOSCOPY WITH PROPOFOL N/A 08/30/2017   Procedure: COLONOSCOPY WITH PROPOFOL;  Surgeon: Toney Reil, MD;  Location: [facility] ENDOSCOPY;  Service: Endoscopy;  Laterality: N/A;   DG THUMB LEFT HAND  01/24/2018   repair joint in left thumb   DORSAL COMPARTMENT RELEASE Left 10/25/2014   Procedure: LEFT FIRST  DORSAL COMPARTMENT RELEASE AND RADIAL TENOSYNOVECTOMY ;  Surgeon: Dominica Severin, MD;  Location:   SURGERY CENTER;  Service: Orthopedics;  Laterality: Left;   HAND SURGERY     KNEE SURGERY Right 2012   meniscus tear   KNEE SURGERY  2012   LAMINECTOMY  1995   TUBAL LIGATION  1982   WRIST SURGERY Right    Family History  Problem Relation Age of Onset   Sarcoidosis Mother    Seizures Mother    Heart attack Mother    Alzheimer's disease Maternal Grandmother    Dementia Maternal Grandmother    Hypertension Maternal Grandfather    Diabetes Maternal Grandfather    Breast cancer Neg Hx    Allergic rhinitis Neg Hx    Asthma Neg Hx    Eczema Neg Hx    Urticaria Neg Hx    Social History   Socioeconomic History   Marital status: Married    Spouse name: Not on file   Number of children: 2   Years of education: Not on file   Highest education level: 12th grade  Occupational History   Occupation: disabled  Tobacco Use   Smoking status: Former    Current packs/day: 0.00    Types: Cigarettes    Quit date: 01/03/1983    Years since quitting: 39.9   Smokeless tobacco: Never   Tobacco comments:    quit in 1984  Vaping Use   Vaping status: Never Used  Substance and Sexual Activity    Alcohol use: No    Alcohol/week: 0.0 standard drinks of alcohol   Drug use: No   Sexual activity: Never  Other Topics Concern   Not on file  Social History Narrative   ** Merged History Encounter ** She is a married mother of 2, grandmother 3. She tries to get exercise but is not doing any routine program.She quit smoking over 25 years ago does not drink alcohol.   No caffiene since 03/2022.    Social Determinants of Health   Financial Resource Strain: Low Risk  (12/06/2022)   Overall Financial Resource Strain (CARDIA)    Difficulty of Paying Living Expenses: Not very hard  Recent Concern: Financial Resource Strain - Medium Risk (11/09/2022)   Overall Financial Resource Strain (CARDIA)    Difficulty of Paying Living Expenses: Somewhat hard  Food Insecurity: Food Insecurity Present (12/06/2022)   Hunger Vital Sign    Worried About Running Out of Food in the Last Year: Sometimes true    Ran Out of Food in the Last Year: Sometimes true  Transportation Needs: No Transportation Needs (12/06/2022)   PRAPARE - Administrator, Civil Service (Medical): No    Lack of Transportation (Non-Medical): No  Physical Activity: Sufficiently Active (12/06/2022)   Exercise Vital Sign    Days of Exercise per Week: 5 days    Minutes of Exercise per Session: 40 min  Stress: Stress Concern Present (12/06/2022)   [employer] of Occupational Health - Occupational Stress Questionnaire    Feeling of Stress : To some extent  Social Connections: Socially Integrated (12/06/2022)   Social Connection and Isolation Panel [NHANES]    Frequency of Communication with Friends and Family: More than three times a week    Frequency of Social Gatherings with Friends and Family: Twice a week    Attends Religious Services: More than 4 times per year    Active Member of [employer] or Organizations: Yes    Attends Engineer, structural: More than 4 times per year    Marital Status: Married    Tobacco  Counseling Counseling given: Not Answered Tobacco comments: quit in 1984   Clinical Intake:  Pre-visit preparation completed: Yes  Pain : 0-10 Pain Score: 5  Pain Type: Chronic pain Pain Location: Knee Pain Orientation: Left Pain Descriptors / Indicators: Aching Pain Onset: More than a month ago Pain Frequency: Intermittent Pain Relieving Factors: heating pad  Pain Relieving Factors: heating pad  BMI - recorded: 30.47 Nutritional Status: BMI > 30  Obese Nutritional Risks: None Diabetes: Yes CBG done?: Yes (BS 92 this am at home) CBG resulted in Enter/ Edit results?: No Did pt. bring in CBG monitor from home?: No  How often do you need to have someone help you when you read instructions, pamphlets, or other written materials from your doctor or pharmacy?: 1 - Never  Interpreter Needed?: No  Comments: lives with partner Information entered by :: B.,LPN   Activities of Daily Living    12/06/2022   11:51 AM 10/14/2022    8:47 AM  In your present state of health, do you have any difficulty performing the following activities:  Hearing? 1 1  Vision? 0 0  Difficulty concentrating or making decisions? 1 1  Walking or climbing stairs? 1 1  Dressing or bathing? 0 0  Doing errands, shopping? 0 0  Preparing Food and eating ? N   Using the Toilet? N   In the past six months, have you accidently leaked urine? N   Do you have problems with loss of bowel control? N   Managing your Medications? N   Managing your Finances? N   Housekeeping or managing your Housekeeping? N     Patient Care Team: Lennette Bihari, MD as PCP - Cardiology (Cardiology) Santiago Glad, MD as Referring Physician (Specialist) Tressie Stalker, MD as Consulting Physician (Neurosurgery) Dellis Anes Hetty Ely, MD as Consulting Physician (Allergy and Immunology) Kateri Mc Roe Rutherford, PA as Physician Assistant (Cardiology) Rickard Patience, MD as Consulting Physician (Oncology)  Indicate any recent  Medical Services you may have received from other than [facility] providers in the past year (date may be approximate).     Assessment:   This is a routine wellness examination for Ar[place]ia.  Hearing/Vision screen Hearing Screening - Comments:: Inadequate hearing Referral to Audiology Vision Screening - Comments:: Adequate vision [place]-Dr [name]  Dietary issues and exercise activities discussed:     Goals Addressed             This Visit's Progress    COMPLETED: Chronic Care Management       Current Barriers:  Chronic Disease Management support and education needs related to Congestive Cardiac Failure, Hypertension, Diabetes, Mixed Hyperlipidemia  Nurse Case Manager Clinical Goal(s):  Over the next 120 days, patient: Will not require hospitalization or emergent care d/t complications r/t chronic illnesses. Will monitor BP and record readings. Will monitor FBS daily and maintain a log. Will weigh at least once a week and record readings. Will continue compliance with a recommended heart healthy/diabetic diet. Will take all medications as prescribed. Will attend medical appointments as scheduled. Will adhere to recommended safety precautions to prevent accidental falls and injuries. Over the next 2 weeks, patient will: Follow-up with [facility] regarding glucometer supplies. Receive a Covid-19 vaccine booster.   Interventions:  Inter-disciplinary care team collaboration (see longitudinal plan of care) Reviewed medications. Reports taking as prescribed. Reports needing lancets. Pharmacy was contacted today to refill. Denies concerns regarding medication management or prescription cost.   Discussed s/sx of complications r/t Congestive cardiac failure. Reports  taking diuretic. No concerns regarding increased edema. Denies chest pain, palpitations, shortness of breath or decline in activity tolerance.   Discussed blood glucose readings and compliance with recommended  heart healthy/diabetic diet. Reports not monitoring recently d/t not having lancets. Pharmacy was contacted today. Reports improvements with her dietary intake. Encouraged to consistently monitor blood glucose levels and record readings once lancets are received.   Reviewed safety and fall prevention measures. Reports ambulating with caution d/t right knee pain. She continues to receive joint injections. Reports the injections have been effective in the past but noticed that her right knee seems more painful with movement. She agreed to notify a provider if the pain worsens.    Patient Self Care Activities:  Patient will self administer medications. Patient will attend all scheduled provider appointments Patient will call pharmacy for medication refills Patient will continue to perform ADL's independently Patient will continue to perform IADL's independently Patient will call provider office for new concerns or questions   Please see past updates related to this goal by clicking on the "Past Updates" button in the selected goal       DIET - EAT MORE FRUITS AND VEGETABLES   On track    Increase water intake   On track    Recommend to continue drinking 40 oz of water a day.     LIFESTYLE - DECREASE FALLS RISK   On track    Recommend to remove any items from the home that may cause slips or trips.       Depression Screen    12/06/2022   11:47 AM 10/14/2022    8:47 AM 08/11/2022    9:23 AM 07/29/2022    9:17 AM 07/14/2022    4:00 PM 04/07/2022    9:32 AM 11/26/2021    9:45 AM  PHQ 2/9 Scores  PHQ - 2 Score 0 2 2 2 2 2 1   PHQ- 9 Score  7  6 6 5 6     Fall Risk    12/06/2022   11:42 AM 10/14/2022    8:47 AM 08/11/2022    9:23 AM 07/29/2022    9:16 AM 07/14/2022    4:01 PM  Fall Risk   Falls in the past year? 1 1 1 1 1   Number falls in past yr: 0 1 0 0 0  Injury with Fall? 0 0 0 0 1  Risk for fall due to : No Fall Risks History of fall(s) History of fall(s) History of fall(s) History of  fall(s)  Follow up Education provided;Falls prevention discussed Falls evaluation completed       MEDICARE RISK AT HOME:  Medicare Risk at Home - 12/06/22 1142     Any stairs in or around the home? Yes    If so, are there any without handrails? Yes    Home free of loose throw rugs in walkways, pet beds, electrical cords, etc? Yes    Adequate lighting in your home to reduce risk of falls? Yes    Life alert? No    Use of a cane, walker or w/c? No    Grab bars in the bathroom? No    Shower chair or bench in shower? No    Elevated toilet seat or a handicapped toilet? No             TIMED UP AND GO:  Was the test performed?  No    Cognitive Function:        12/06/2022  11:52 AM 02/21/2020    3:32 PM 12/12/2018   10:25 AM 07/20/2016    1:21 PM  6CIT Screen  What Year? 0 points 0 points 0 points 0 points  What month? 0 points 0 points 0 points 0 points  What time? 0 points 0 points 0 points 0 points  Count back from 20 0 points 0 points 0 points 0 points  Months in reverse 0 points 0 points 0 points 0 points  Repeat phrase 0 points 0 points 0 points 10 points  Total Score 0 points 0 points 0 points 10 points    Immunizations Immunization History  Administered Date(s) Administered   COVID-19, mRNA, vaccine(Comirnaty)12 years and older 07/29/2022   Fluad Quad(high Dose 65+) 07/29/2022   Influenza,inj,Quad PF,6+ Mos 02/28/2017, 01/17/2018, 02/06/2019, 02/05/2020   PFIZER(Purple Top)SARS-COV-2 Vaccination 07/20/2019, 08/10/2019, 04/14/2020   Pneumococcal Polysaccharide-23 01/17/2018   Tdap 10/20/2006    TDAP status: Up to date  Flu Vaccine status: Up to date  Pneumococcal vaccine status: Up to date  Covid-19 vaccine status: Completed vaccines  Qualifies for Shingles Vaccine? Yes   Zostavax completed No   Shingrix Completed?: No.    Education has been provided regarding the importance of this vaccine. Patient has been advised to call insurance company to determine  out of pocket expense if they have not yet received this vaccine. Advised may also receive vaccine at local pharmacy or Health Dept. Verbalized acceptance and understanding.  Screening Tests Health Maintenance  Topic Date Due   Zoster Vaccines- Shingrix (1 of 2) Never done   DTaP/Tdap/Td (2 - Td or Tdap) 10/19/2016   OPHTHALMOLOGY EXAM  12/02/2021   Colonoscopy  08/31/2022   COVID-19 Vaccine (5 - 2023-24 season) 09/23/2022   HEMOGLOBIN A1C  10/07/2022   INFLUENZA VACCINE  12/02/2022   Diabetic kidney evaluation - Urine ACR  04/08/2023   FOOT EXAM  07/05/2023   MAMMOGRAM  07/08/2023   Diabetic kidney evaluation - eGFR measurement  07/13/2023   Medicare Annual Wellness (AWV)  12/06/2023   Hepatitis C Screening  Completed   HIV Screening  Completed   HPV VACCINES  Aged Out    Health Maintenance  Health Maintenance Due  Topic Date Due   Zoster Vaccines- Shingrix (1 of 2) Never done   DTaP/Tdap/Td (2 - Td or Tdap) 10/19/2016   OPHTHALMOLOGY EXAM  12/02/2021   Colonoscopy  08/31/2022   COVID-19 Vaccine (5 - 2023-24 season) 09/23/2022   HEMOGLOBIN A1C  10/07/2022   INFLUENZA VACCINE  12/02/2022    Colorectal cancer screening: Type of screening: Colonoscopy. Completed no. Repeat every 5-10 years Pt has scheduled for 03/29/23  Mammogram status: Completed yes. Repeat every year  Bone Density status: Completed yes. Results reflect: Bone density results: NORMAL. Repeat every 5 years.  Lung Cancer Screening: (Low Dose CT Chest recommended if Age 70-80 years, 20 pack-year currently smoking OR have quit w/in 15years.) does not qualify.   Lung Cancer Screening Referral: no  Additional Screening:  Hepatitis C Screening: does not qualify; Completed yes  Vision Screening: Recommended annual ophthalmology exams for early detection of glaucoma and other disorders of the eye. Is the patient up to date with their annual eye exam?  Yes  Who is the provider or what is the name of the office  in which the patient attends annual eye exams? Dr Ethlyn Daniels in [place] If pt is not established with a provider, would they like to be referred to a provider to establish care? No .  Dental Screening: Recommended annual dental exams for proper oral hygiene  Diabetic Foot Exam: Diabetic Foot Exam: Completed yes  Community Resource Referral / Chronic Care Management: CRR required this visit?  No   CCM required this visit?  No     Plan:     I have personally reviewed and noted the following in the patient's chart:   Medical and social history Use of alcohol, tobacco or illicit drugs  Current medications and supplements including opioid prescriptions. Patient is not currently taking opioid prescriptions. Functional ability and status Nutritional status Physical activity Advanced directives List of other physicians Hospitalizations, surgeries, and ER visits in previous 12 months Vitals Screenings to include cognitive, depression, and falls Referrals and appointments  In addition, I have reviewed and discussed with patient certain preventive protocols, quality metrics, and best practice recommendations. A written personalized care plan for preventive services as well as general preventive health recommendations were provided to patient.     Sue Lush, LPN   05/06/863   After Visit Summary: (MyChart) Due to this being a telephonic visit, the after visit summary with patients personalized plan was offered to patient via MyChart   Nurse Notes: pt relays she has balance issues which is causing the falls she incurs. Pt inquires if PT an option to improve balance. Pt also relays her hearing is not good and needs hearing test.  *Referral placed for Audiology.

## 2022-12-09 ENCOUNTER — Ambulatory Visit: Payer: Medicare Other | Admitting: Allergy & Immunology

## 2022-12-10 ENCOUNTER — Ambulatory Visit (INDEPENDENT_AMBULATORY_CARE_PROVIDER_SITE_OTHER): Payer: Medicare Other | Admitting: Clinical

## 2022-12-10 DIAGNOSIS — F332 Major depressive disorder, recurrent severe without psychotic features: Secondary | ICD-10-CM | POA: Diagnosis not present

## 2022-12-10 DIAGNOSIS — F411 Generalized anxiety disorder: Secondary | ICD-10-CM

## 2022-12-10 NOTE — Progress Notes (Signed)
[facility] Counselor/Therapist Progress Note  Patient ID: Abigail Figueroa, MRN: [id number],    Date: 12/10/2022  Time Spent: 12:35 pm - 1:24 pm : 49 minutes  Treatment Type: Individual Therapy  Reported Symptoms: none reported   Mental Status Exam: Appearance:  Neat and Well Groomed     Behavior: Appropriate  Motor: Normal  Speech/Language:  Clear and Coherent  Affect: Appropriate  Mood: normal  Thought process: normal  Thought content:   WNL  Sensory/Perceptual disturbances:   WNL  Orientation: oriented to person, place, and situation  Attention: Good  Concentration: Good  Memory: WNL  Fund of knowledge:  Good  Insight:   Good  Judgment:  Good  Impulse Control: Good   Risk Assessment: Danger to Self:  No Patient denied current suicidal ideation  Self-injurious Behavior: No Danger to Others: No Patient denied current homicidal ideation Duty to Warn:no Physical Aggression / Violence:No  Access to Firearms a concern: No  Gang Involvement:No   Subjective: Patient stated, "its been going pretty good" in response to events since last session. Patient stated, "I have my good and I have my bad" in regards to days since last session.  Patient reported she has experienced more good days than bad days since last session. Patient reported she feels recent changes in medication have contributed to improvement in mood. Patient stated, "I'm doing good" in response to patient's current mood. Patient reported increased participation in the family's garden and reported her grandson has asked patient to go with him to the garden and has been encouraging patient's participation. Patient stated, "it makes me feel good" in response to participating in their garden and spending time with her grandson. Patient reported her family has been spending more time together during the recent inclement weather, and reported she has been playing a game with her daughter. Patient reported she  is excited about her upcoming family reunion. Patient stated, "Im just so happy today". Patient reported she documented in her gratitude journal the word grateful. Patient reported she has forgiven her brother and reported she was grateful for the ability to forgive her brother. Patient stated, "I feel good about the whole thing" in response to patient's decision to forgive her brother. Patient reported breaking tasks into smaller steps was helpful and stated, "I will continue to use that".   Interventions: Cognitive Behavioral Therapy and Interpersonal. Clinician conducted session via caregility video from clinician's home office. Patient provided verbal consent to proceed with telehealth session and is aware of limitations of telephone or video visits. Patient participated in session from patient's home. Assisted patient in exploring and identifying contributing factors to continued improvement in mood. Assessed patient's current mood. Reviewed patient's gratitude journal. Discussed patient's decision to forgive her brother, patient's thoughts/feelings in response, and the impact on patient's mood. Reviewed patient's implementation of breaking tasks into smaller steps and the outcome. Provided reflective listening and validation. Clinician requested patient continue gratitude journal for homework.    Collaboration of Care: not required at this time   Diagnosis:  Major Depressive Disorder, recurrent, severe without psychotic features   Generalized anxiety disorder   Plan: Patient is to utilize [employer] Therapy, thought re-framing, relaxation techniques, mindfulness, effective communication, and coping strategies to decrease symptoms associated with Major Depressive Disorder and Generalized Anxiety Disorder. Frequency: bi-weekly  Modality: individual      Long-term goal:   Patient stated, "I want to see a change in the anxiety".      Reduce overall level,  frequency, and intensity of  the symptoms of anxiety and depression as evidenced by decreased sadness, feelings of anxiety, feeling "lost", lack of energy, difficulty falling and staying asleep, changes in concentration, psychomotor retardation, rapid heart rate, excessive worry, restlessness, feeling jittery, and feeling on edge from 5 to 6 days per week to 0 to 1 days per week per patient reported for at least 3 consecutive months.    Target Date: 02/06/23  Progress: progressing    Short-term goal:  Develop coping strategies to utilize in response to symptoms of depression/anxiety and stressors   Target Date: 02/06/23  Progress: progressing    Verbally express patient's thoughts and feelings to others and utilize effective communication strategies when expressing patient's thoughts/feelings   Target Date: 02/06/23  Progress: progressing    Verbalize an understanding of the relationship between symptoms of anxiety and the impact on patient's thought patterns and behaviors   Target Date: 02/06/23  Progress: progressing     Doree Barthel, LCSW

## 2022-12-10 NOTE — Progress Notes (Signed)
                Karen Sharpe, LCSW 

## 2022-12-14 DIAGNOSIS — R5383 Other fatigue: Secondary | ICD-10-CM | POA: Diagnosis not present

## 2022-12-14 DIAGNOSIS — D3501 Benign neoplasm of right adrenal gland: Secondary | ICD-10-CM | POA: Diagnosis not present

## 2022-12-14 DIAGNOSIS — N184 Chronic kidney disease, stage 4 (severe): Secondary | ICD-10-CM | POA: Diagnosis not present

## 2022-12-14 DIAGNOSIS — R251 Tremor, unspecified: Secondary | ICD-10-CM | POA: Diagnosis not present

## 2022-12-14 DIAGNOSIS — D3502 Benign neoplasm of left adrenal gland: Secondary | ICD-10-CM | POA: Diagnosis not present

## 2022-12-14 DIAGNOSIS — N179 Acute kidney failure, unspecified: Secondary | ICD-10-CM | POA: Diagnosis not present

## 2022-12-14 LAB — MICROALBUMIN / CREATININE URINE RATIO: Microalb Creat Ratio: 4.7

## 2022-12-14 LAB — PROTEIN / CREATININE RATIO, URINE
Albumin, U: 0.6
Creatinine, Urine: 128.6

## 2022-12-21 ENCOUNTER — Ambulatory Visit: Payer: Medicare Other | Admitting: Audiology

## 2022-12-26 ENCOUNTER — Other Ambulatory Visit: Payer: Self-pay | Admitting: Physician Assistant

## 2022-12-26 DIAGNOSIS — R569 Unspecified convulsions: Secondary | ICD-10-CM

## 2022-12-27 ENCOUNTER — Encounter: Payer: Self-pay | Admitting: Dietician

## 2022-12-27 ENCOUNTER — Ambulatory Visit: Payer: Medicare Other | Admitting: Clinical

## 2022-12-27 DIAGNOSIS — M1811 Unilateral primary osteoarthritis of first carpometacarpal joint, right hand: Secondary | ICD-10-CM | POA: Diagnosis not present

## 2022-12-27 NOTE — Progress Notes (Signed)
Have not heard back from patient to reschedule her cancelled appointment from 11/10/22. Sent notification to referring provider.

## 2022-12-28 NOTE — Telephone Encounter (Signed)
Requested Prescriptions  Pending Prescriptions Disp Refills   TEGRETOL-XR 400 MG 12 hr tablet [Pharmacy Med Name: TEGRETOL XR 400 MG TABLET] 180 tablet 2    Sig: TAKE 1 TABLET BY MOUTH TWICE A DAY     Neurology:  Anticonvulsants - carbamazepine Failed - 12/26/2022  1:22 AM      Failed - AST in normal range and within 360 days    AST  Date Value Ref Range Status  07/13/2022 218 (H) 15 - 41 U/L Final   SGOT(AST)  Date Value Ref Range Status  08/16/2014 54 (H) U/L Final    Comment:    15-41 NOTE: New Reference Range  07/09/14          Failed - ALT in normal range and within 360 days    ALT  Date Value Ref Range Status  07/13/2022 80 (H) 0 - 44 U/L Final   SGPT (ALT)  Date Value Ref Range Status  08/16/2014 49 U/L Final    Comment:    14-54 NOTE: New Reference Range  07/09/14          Failed - Carbamazepine (serum) in normal range and within 360 days    Carbamazepine, Total  Date Value Ref Range Status  08/09/2013 4.9 4.0 - 12.0 mcg/mL Final   Carbamazepine (Tegretol), S  Date Value Ref Range Status  12/16/2021 5.2 4.0 - 12.0 ug/mL Final    Comment:             In conjunction with other antiepileptic drugs                                Therapeutic  4.0 -  8.0                                Toxicity     9.0 - 12.0                                    Carbamazepine alone                                Therapeutic  8.0 - 12.0                                 Detection Limit =  2.0                           <2.0 indicates None Detected          Failed - HGB in normal range and within 360 days    Hemoglobin  Date Value Ref Range Status  11/17/2022 10.3 (L) 12.0 - 15.0 g/dL Final  86/57/8469 9.4 (L) 11.1 - 15.9 g/dL Final         Failed - HCT in normal range and within 360 days    HCT  Date Value Ref Range Status  11/17/2022 31.3 (L) 36.0 - 46.0 % Final    Comment:    Performed at [facility], [address] [place], [place] [address]   Hematocrit   Date Value Ref Range Status  06/15/2022 29.1 (L) 34.0 - 46.6 %  Final         Failed - Cr in normal range and within 360 days    Creat  Date Value Ref Range Status  02/28/2017 1.03 0.50 - 1.05 mg/dL Final    Comment:    For patients >10 years of age, the reference limit for Creatinine is approximately 13% higher for people identified as African-American. .    Creatinine, Ser  Date Value Ref Range Status  07/13/2022 2.24 (H) 0.44 - 1.00 mg/dL Final   Creatinine, POC  Date Value Ref Range Status  07/20/2016 NA mg/dL Final         Passed - WBC in normal range and within 360 days    WBC  Date Value Ref Range Status  09/21/2022 4.9 4.0 - 10.5 K/uL Final         Passed - PLT in normal range and within 360 days    Platelets  Date Value Ref Range Status  09/21/2022 214 150 - 400 K/uL Final  06/15/2022 255 150 - 450 x10E3/uL Final         Passed - Na in normal range and within 360 days    Sodium  Date Value Ref Range Status  07/13/2022 139 135 - 145 mmol/L Final  06/15/2022 143 134 - 144 mmol/L Final  08/16/2014 144 mmol/L Final    Comment:    135-145 NOTE: New Reference Range  07/09/14          Passed - Completed PHQ-2 or PHQ-9 in the last 360 days      Passed - Valid encounter within last 12 months    Recent Outpatient Visits           1 month ago Nausea   [facility] [name], PA-C   2 months ago Tremor   [facility] [name], PA-C   2 months ago Tremor   [facility] [name], PA-C   3 months ago Acute non-recurrent frontal sinusitis   [facility] [name], PA-C   5 months ago CKD (chronic kidney disease) stage 4, GFR 15-29 ml/min [facility])   [facility] [name], PA-C       Future Appointments             In 2 weeks Dellis Anes, Hetty Ely, MD [facility] at  [place]   In 3 weeks Bacigalupo, Marzella Schlein, MD [facility]   In 3 months Lennette Bihari, MD [facility]

## 2022-12-29 DIAGNOSIS — G20A1 Parkinson's disease without dyskinesia, without mention of fluctuations: Secondary | ICD-10-CM | POA: Diagnosis not present

## 2022-12-29 DIAGNOSIS — R251 Tremor, unspecified: Secondary | ICD-10-CM | POA: Diagnosis not present

## 2022-12-29 DIAGNOSIS — R413 Other amnesia: Secondary | ICD-10-CM | POA: Diagnosis not present

## 2022-12-29 DIAGNOSIS — G20C Parkinsonism, unspecified: Secondary | ICD-10-CM | POA: Diagnosis not present

## 2022-12-30 DIAGNOSIS — M542 Cervicalgia: Secondary | ICD-10-CM | POA: Diagnosis not present

## 2022-12-30 DIAGNOSIS — G518 Other disorders of facial nerve: Secondary | ICD-10-CM | POA: Diagnosis not present

## 2022-12-30 DIAGNOSIS — M791 Myalgia, unspecified site: Secondary | ICD-10-CM | POA: Diagnosis not present

## 2022-12-30 DIAGNOSIS — G43719 Chronic migraine without aura, intractable, without status migrainosus: Secondary | ICD-10-CM | POA: Diagnosis not present

## 2022-12-31 ENCOUNTER — Inpatient Hospital Stay: Payer: Medicare Other | Attending: Oncology

## 2022-12-31 DIAGNOSIS — N184 Chronic kidney disease, stage 4 (severe): Secondary | ICD-10-CM | POA: Insufficient documentation

## 2022-12-31 DIAGNOSIS — D631 Anemia in chronic kidney disease: Secondary | ICD-10-CM | POA: Insufficient documentation

## 2022-12-31 LAB — CBC WITH DIFFERENTIAL (CANCER CENTER ONLY)
Abs Immature Granulocytes: 0.03 10*3/uL (ref 0.00–0.07)
Basophils Absolute: 0.1 10*3/uL (ref 0.0–0.1)
Basophils Relative: 1 %
Eosinophils Absolute: 0.3 10*3/uL (ref 0.0–0.5)
Eosinophils Relative: 7 %
HCT: 28.1 % — ABNORMAL LOW (ref 36.0–46.0)
Hemoglobin: 8.9 g/dL — ABNORMAL LOW (ref 12.0–15.0)
Immature Granulocytes: 1 %
Lymphocytes Relative: 42 %
Lymphs Abs: 2.1 10*3/uL (ref 0.7–4.0)
MCH: 28.3 pg (ref 26.0–34.0)
MCHC: 31.7 g/dL (ref 30.0–36.0)
MCV: 89.5 fL (ref 80.0–100.0)
Monocytes Absolute: 0.4 10*3/uL (ref 0.1–1.0)
Monocytes Relative: 7 %
Neutro Abs: 2.1 10*3/uL (ref 1.7–7.7)
Neutrophils Relative %: 42 %
Platelet Count: 176 10*3/uL (ref 150–400)
RBC: 3.14 MIL/uL — ABNORMAL LOW (ref 3.87–5.11)
RDW: 13.7 % (ref 11.5–15.5)
WBC Count: 5 10*3/uL (ref 4.0–10.5)
nRBC: 0 % (ref 0.0–0.2)

## 2022-12-31 LAB — RETIC PANEL
Immature Retic Fract: 10.2 % (ref 2.3–15.9)
RBC.: 3.15 MIL/uL — ABNORMAL LOW (ref 3.87–5.11)
Retic Count, Absolute: 26.5 10*3/uL (ref 19.0–186.0)
Retic Ct Pct: 0.8 % (ref 0.4–3.1)
Reticulocyte Hemoglobin: 30.5 pg (ref >27.9)

## 2022-12-31 LAB — IRON AND TIBC
Iron: 107 ug/dL (ref 28–170)
Saturation Ratios: 36 % — ABNORMAL HIGH (ref 10.4–31.8)
TIBC: 298 ug/dL (ref 250–450)
UIBC: 191 ug/dL

## 2022-12-31 LAB — FERRITIN: Ferritin: 153 ng/mL (ref 11–307)

## 2023-01-04 ENCOUNTER — Inpatient Hospital Stay: Payer: Medicare Other

## 2023-01-04 ENCOUNTER — Inpatient Hospital Stay: Payer: Medicare Other | Attending: Oncology | Admitting: Oncology

## 2023-01-04 ENCOUNTER — Encounter: Payer: Self-pay | Admitting: Oncology

## 2023-01-04 VITALS — BP 121/71 | HR 75 | Temp 97.0°F | Resp 18

## 2023-01-04 VITALS — BP 134/78 | HR 72 | Temp 96.8°F | Resp 18 | Wt 185.5 lb

## 2023-01-04 DIAGNOSIS — N184 Chronic kidney disease, stage 4 (severe): Secondary | ICD-10-CM | POA: Diagnosis not present

## 2023-01-04 DIAGNOSIS — D631 Anemia in chronic kidney disease: Secondary | ICD-10-CM | POA: Diagnosis not present

## 2023-01-04 MED ORDER — SODIUM CHLORIDE 0.9 % IV SOLN
200.0000 mg | Freq: Once | INTRAVENOUS | Status: AC
  Administered 2023-01-04: 200 mg via INTRAVENOUS
  Filled 2023-01-04: qty 200

## 2023-01-04 MED ORDER — SODIUM CHLORIDE 0.9 % IV SOLN
Freq: Once | INTRAVENOUS | Status: AC
  Filled 2023-01-04: qty 250

## 2023-01-04 NOTE — Progress Notes (Signed)
Hematology/Oncology Consult note [phone] [phone]    REFERRING PROVIDER: [name], PA-C  CHIEF COMPLAINTS/REASON FOR VISIT:  Anemia  ASSESSMENT & PLAN:   Anemia in stage 4 chronic kidney disease (HCC) Status post IV Venofer treatments.  She tolerated well. Labs are reviewed and discussed with patient. Lab Results  Component Value Date   HGB 8.9 (L) 12/31/2022   TIBC 298 12/31/2022   IRONPCTSAT 36 (H) 12/31/2022   FERRITIN 153 12/31/2022   Ferritin not at  goal.  Hemoglobin 8.9 , less than 11. Recommend Venofer weekly x 2 to further improve iron level.  Retacrit 20,000 units every 4 weeks  CKD (chronic kidney disease) stage 4, GFR 15-29 ml/min (HCC) Encourage oral hydration and avoid nephrotoxins.  Previous workup negative for M protein and only slightly increased light chain ratio.  Nonspecific.   Negative M protein on 24-hour urine protein electrophoresis.  Orders Placed This Encounter  Procedures   Hemoglobin and Hematocrit, Blood    Standing Status:   Future    Standing Expiration Date:   01/04/2024   Hemoglobin and Hematocrit, Blood    Standing Status:   Future    Standing Expiration Date:   01/04/2024   Hemoglobin and Hematocrit, Blood    Standing Status:   Future    Standing Expiration Date:   01/04/2024   CBC with Differential (Cancer Center Only)    Standing Status:   Future    Standing Expiration Date:   01/04/2024   Iron and TIBC    Standing Status:   Future    Standing Expiration Date:   01/04/2024   Ferritin    Standing Status:   Future    Standing Expiration Date:   01/04/2024   Retic Panel    Standing Status:   Future    Standing Expiration Date:   01/04/2024   Follow-up  H&H monthly +/- Retacrit x 2 3 months, labs prior to MD +/- Retacrit +/- Venofer. All questions were answered. The patient knows to call the clinic with any problems, questions or concerns.  Rickard Patience, MD, PhD [facility] Hematology Oncology 01/04/2023      HISTORY OF PRESENTING ILLNESS:  Abigail Figueroa is a  62 y.o.  female with PMH listed below who was referred to me for anemia Reviewed patient's recent labs that was done.  Patient has progressive worsening of anemia. 07/13/2022, hemoglobin is 8.2, MCV 88.9, normal white count and platelet counts. Patient reports severe fatigue, mild shortness of breath with exertion. She denies recent chest pain on exertion,  , pre-syncopal episodes, or palpitations She had not noticed any recent bleeding such as epistaxis, hematuria.  Patient has intermittent rectal bleeding.  Constipation.  She has upcoming GI appointment for further evaluation.   Patient has chronic kidney disease, stage IV.  She follows up with nephrology.  She has had negative protein electrophoresis.  Light chain ratio was mildly increased which is nonspecific.Marland Kitchen  She is on aspirin 81 mg.   INTERVAL HISTORY Abigail Figueroa is a 62 y.o. female who has above history reviewed by me today presents for follow up visit for anemia due to CKD.  She tolerated Venofer treatments.  Chronic fatigue unchanged.    MEDICAL HISTORY:  Past Medical History:  Diagnosis Date   Abnormal stress test    a. 10/2009: Normal myocardial perfusion imaging; b. 08/2015 MV: medium defect of mod severity in mid ant apical region w/ mild HK of the distal inf wall;  c.  08/2015 Cath: nl Cors, EF 60%.   Anemia    Angio-edema    Anxiety    Arthritis    Bell's palsy    Depression    Dermatitis 09/10/2014   Edema    Essential hypertension    Frequent urination    Frequent urination at night    Headache    migraines, gets botox injections   Heart palpitations 10/2011   Event monitor showing sinus tachycardia, PACs with couplets and triplets.   Hx of echocardiogram    a. 10/2009 Echo: showed normal left ventricular function, mild left ventricular hypertrophy, and no significant valve abnormalities.   Hypercholesterolemia    Migraine    Morbid obesity  (HCC)    Neuromuscular disorder (HCC)    OSA (obstructive sleep apnea)    has been on continuous positive airway pressure   Seizure disorder (HCC)    Seizures (HCC)    Type II diabetes mellitus (HCC)    Urticaria     SURGICAL HISTORY: Past Surgical History:  Procedure Laterality Date   ABDOMINAL HYSTERECTOMY  1990   without BSO   BACK SURGERY  1900s 2001   x2 with "cage put in"   BILATERAL SALPINGOOPHORECTOMY  1990   CARDIAC CATHETERIZATION N/A 08/06/2015   Procedure: Left Heart Cath and Coronary Angiography;  Surgeon: Lennette Bihari, MD;  [facility]: [facility];  Service: Cardiovascular;  Laterality: N/A;   CARPAL TUNNEL RELEASE Bilateral    CHOLECYSTECTOMY  1980   COLONOSCOPY WITH PROPOFOL N/A 08/30/2017   Procedure: COLONOSCOPY WITH PROPOFOL;  Surgeon: Toney Reil, MD;  Location: [facility] END[facility]COPY;  Service: Endoscopy;  Laterality: N/A;   DG THUMB LEFT HAND  01/24/2018   repair joint in left thumb   DORSAL COMPARTMENT RELEASE Left 10/25/2014   Procedure: LEFT FIRST  DORSAL COMPARTMENT RELEASE AND RADIAL TENOSYNOVECTOMY ;  Surgeon: Dominica Severin, MD;  Location: [facility];  Service: Orthopedics;  Laterality: Left;   HAND SURGERY     KNEE SURGERY Right 2012   meniscus tear   KNEE SURGERY  2012   LAMINECTOMY  1995   TUBAL LIGATION  1982   WRIST SURGERY Right     SOCIAL HISTORY: Social History   Socioeconomic History   Marital status: Married    Spouse name: Not on file   Number of children: 2   Years of education: Not on file   Highest education level: 12th grade  Occupational History   Occupation: disabled  Tobacco Use   Smoking status: Former    Current packs/day: 0.00    Types: Cigarettes    Quit date: 01/03/1983    Years since quitting: 40.0   Smokeless tobacco: Never   Tobacco comments:    quit in 1984  Vaping Use   Vaping status: Never Used  Substance and Sexual Activity   Alcohol use: No    Alcohol/week: 0.0 standard drinks of  alcohol   Drug use: No   Sexual activity: Never  Other Topics Concern   Not on file  Social History Narrative   ** Merged History Encounter ** She is a married mother of 2, grandmother 3. She tries to get exercise but is not doing any routine program.She quit smoking over 25 years ago does not drink alcohol.   No caffiene since 03/2022.    Social Determinants of Health   Financial Resource Strain: Low Risk  (12/06/2022)   Overall Financial Resource Strain (CARDIA)    Difficulty of Paying Living  Expenses: Not very hard  Recent Concern: Financial Resource Strain - Medium Risk (11/09/2022)   Overall Financial Resource Strain (CARDIA)    Difficulty of Paying Living Expenses: Somewhat hard  Food Insecurity: Food Insecurity Present (12/06/2022)   Hunger Vital Sign    Worried About Running Out of Food in the Last Year: Sometimes true    Ran Out of Food in the Last Year: Sometimes true  Transportation Needs: No Transportation Needs (12/06/2022)   PRAPARE - Administrator, Civil Service (Medical): No    Lack of Transportation (Non-Medical): No  Physical Activity: Sufficiently Active (12/06/2022)   Exercise Vital Sign    Days of Exercise per Week: 5 days    Minutes of Exercise per Session: 40 min  Stress: Stress Concern Present (12/06/2022)   [employer] of Occupational Health - Occupational Stress Questionnaire    Feeling of Stress : To some extent  Social Connections: Socially Integrated (12/06/2022)   Social Connection and Isolation Panel [NHANES]    Frequency of Communication with Friends and Family: More than three times a week    Frequency of Social Gatherings with Friends and Family: Twice a week    Attends Religious Services: More than 4 times per year    Active Member of [employer] or Organizations: Yes    Attends Engineer, structural: More than 4 times per year    Marital Status: Married  Catering manager Violence: Not At Risk (12/06/2022)   Humiliation, Afraid, Rape,  and Kick questionnaire    Fear of Current or Ex-Partner: No    Emotionally Abused: No    Physically Abused: No    Sexually Abused: No    FAMILY HISTORY: Family History  Problem Relation Age of Onset   Sarcoidosis Mother    Seizures Mother    Heart attack Mother    Alzheimer's disease Maternal Grandmother    Dementia Maternal Grandmother    Hypertension Maternal Grandfather    Diabetes Maternal Grandfather    Breast cancer Neg Hx    Allergic rhinitis Neg Hx    Asthma Neg Hx    Eczema Neg Hx    Urticaria Neg Hx     ALLERGIES:  is allergic to morphine, morphine and codeine, oxycodone, oxycodone hcl, aimovig [erenumab-aooe], augmentin [amoxicillin-pot clavulanate], and morphine sulfate.  MEDICATIONS:  Current Outpatient Medications  Medication Sig Dispense Refill   Azelastine HCl 0.15 % SOLN PLACE 2 SPRAYS INTO BOTH NOSTRILS 2 (TWO) TIMES DAILY (Patient taking differently: Place 2 sprays into both nostrils 2 (two) times daily. Taking as needed) 30 mL 11   BOTOX 100 UNITS SOLR injection Inject into the muscle every 3 (three) months.      busPIRone (BUSPAR) 15 MG tablet TAKE 1 TABLET BY MOUTH TWICE A DAY 180 tablet 1   carbamazepine (TEGRETOL-XR) 400 MG 12 hr tablet TAKE 1 TABLET BY MOUTH TWICE A DAY 180 tablet 2   carbidopa-levodopa (SINEMET IR) 25-100 MG tablet Take 1 tablet by mouth Three (3) times a day. Start with 1/2 pill three times a day, after a week can increase to 1 pill three times a day.     chlorproMAZINE (THORAZINE) 25 MG tablet Take 50 mg by mouth 2 (two) times daily as needed.     chlorzoxazone (PARAFON) 500 MG tablet Take by mouth 4 (four) times daily as needed for muscle spasms.     clotrimazole-betamethasone (LOTRISONE) cream Apply 1 Application topically 2 (two) times daily. 45 g 3   diclofenac Sodium (  VOLTAREN) 1 % GEL Apply topically as needed.     EMGALITY 120 MG/ML SOAJ Inject into the skin as needed.     EPINEPHrine 0.3 mg/0.3 mL IJ SOAJ injection Inject  into the muscle.     ezetimibe (ZETIA) 10 MG tablet Take 1 tablet (10 mg total) by mouth daily. 90 tablet 3   fluocinolone (SYNALAR) 0.01 % external solution Apply topically as needed.     FLUoxetine (PROZAC) 10 MG capsule Take 40 mg by mouth daily.     fluticasone (FLONASE) 50 MCG/ACT nasal spray Place 1-2 sprays into both nostrils as needed for allergies or rhinitis.     furosemide (LASIX) 40 MG tablet Take 1 tablet (40 mg total) by mouth daily. 90 tablet 3   lamoTRIgine (LAMICTAL) 100 MG tablet Take 100 mg by mouth 2 (two) times daily.     Lancets (ONETOUCH ULTRASOFT) lancets Test fasting each morning and 2 hours before supper. Retest if having hypoglycemic symptoms. (Patient taking differently: 1 each by Other route as needed for other. Test fasting each morning and 2 hours before supper. Retest if having hypoglycemic symptoms.) 100 each 12   loratadine (CLARITIN) 10 MG tablet Take 1 tablet (10 mg total) by mouth in the morning and at bedtime. 60 tablet 11   metoprolol tartrate (LOPRESSOR) 100 MG tablet TAKE 1 TABLET BY MOUTH TWICE A DAY 180 tablet 3   ONETOUCH VERIO test strip TEST FASTING GLUCOSE DAILY AS DIRECTED 100 strip 3   promethazine (PHENERGAN) 25 MG tablet Take 25 mg by mouth every 4 (four) hours as needed.     rosuvastatin (CRESTOR) 20 MG tablet TAKE 1 TABLET BY MOUTH EVERY DAY 90 tablet 1   Semaglutide,0.25 or 0.5MG /DOS, (OZEMPIC, 0.25 OR 0.5 MG/DOSE,) 2 MG/3ML SOPN Inject 0.25 mg into the skin once a week. (Patient taking differently: Inject 0.5 mg into the skin once a week.) 3 mL 0   tretinoin (RETIN-A) 0.025 % cream Apply topically at bedtime.     triamcinolone ointment (KENALOG) 0.1 % Apply 1 Application topically 2 (two) times daily.     amLODipine (NORVASC) 5 MG tablet Take 1.5 tablet (7.5mg ) by mouth daily. (Patient not taking: Reported on 01/04/2023) 180 tablet 1   No current facility-administered medications for this visit.    Review of Systems  Constitutional:  Positive  for fatigue. Negative for appetite change, chills and fever.  HENT:   Negative for hearing loss and voice change.   Eyes:  Negative for eye problems.  Respiratory:  Negative for chest tightness and cough.   Cardiovascular:  Negative for chest pain.  Gastrointestinal:  Negative for abdominal distention, abdominal pain and blood in stool.  Endocrine: Negative for hot flashes.  Genitourinary:  Negative for difficulty urinating and frequency.   Musculoskeletal:  Negative for arthralgias.  Skin:  Negative for itching and rash.  Neurological:  Negative for extremity weakness.  Hematological:  Negative for adenopathy.  Psychiatric/Behavioral:  Negative for confusion.     PHYSICAL EXAMINATION: ECOG PERFORMANCE STATUS: 1 - Symptomatic but completely ambulatory Vitals:   01/04/23 1432  BP: 134/78  Pulse: 72  Resp: 18  Temp: (!) 96.8 F (36 C)  SpO2: 100%   Filed Weights   01/04/23 1432  Weight: 185 lb 8 oz (84.1 kg)    Physical Exam Constitutional:      General: She is not in acute distress. HENT:     Head: Normocephalic and atraumatic.  Eyes:     General: No scleral icterus.  Cardiovascular:     Rate and Rhythm: Normal rate and regular rhythm.     Heart sounds: Normal heart sounds.  Pulmonary:     Effort: Pulmonary effort is normal. No respiratory distress.     Breath sounds: No wheezing.  Abdominal:     General: Bowel sounds are normal. There is no distension.     Palpations: Abdomen is soft.  Musculoskeletal:        General: No deformity. Normal range of motion.     Cervical back: Normal range of motion and neck supple.  Skin:    General: Skin is warm and dry.     Findings: No erythema or rash.  Neurological:     Mental Status: She is alert and oriented to person, place, and time. Mental status is at baseline.     Cranial Nerves: No cranial nerve deficit.     Coordination: Coordination normal.  Psychiatric:        Mood and Affect: Mood normal.      LABORATORY  DATA:  I have reviewed the data as listed    Latest Ref Rng & Units 12/31/2022    2:22 PM 11/17/2022   10:01 AM 10/20/2022   10:20 AM  CBC  WBC 4.0 - 10.5 K/uL 5.0     Hemoglobin 12.0 - 15.0 g/dL 8.9  82.9  9.7   Hematocrit 36.0 - 46.0 % 28.1  31.3  29.7   Platelets 150 - 400 K/uL 176         Latest Ref Rng & Units 07/13/2022   12:29 AM 06/15/2022    2:38 PM 04/12/2022   12:18 PM  CMP  Glucose 70 - 99 mg/dL 562  130  865   BUN 8 - 23 mg/dL 40  27  24   Creatinine 0.44 - 1.00 mg/dL 7.84  6.96  2.95   Sodium 135 - 145 mmol/L 139  143  140   Potassium 3.5 - 5.1 mmol/L 3.5  4.4  4.2   Chloride 98 - 111 mmol/L 104  105  104   CO2 22 - 32 mmol/L 26  22  22    Calcium 8.9 - 10.3 mg/dL 9.0  28.4  13.2   Total Protein 6.5 - 8.1 g/dL 7.3  7.5  7.8   Total Bilirubin 0.3 - 1.2 mg/dL 0.4  <4.4    Alkaline Phos 38 - 126 U/L 86  92    AST 15 - 41 U/L 218  29    ALT 0 - 44 U/L 80  34        Component Value Date/Time   IRON 107 12/31/2022 1422   TIBC 298 12/31/2022 1422   FERRITIN 153 12/31/2022 1422   IRONPCTSAT 36 (H) 12/31/2022 1422     RADIOGRAPHIC STUDIES: I have personally reviewed the radiological images as listed and agreed with the findings in the report. No results found.

## 2023-01-04 NOTE — Progress Notes (Signed)
Iron sats elevated and Ferritin 153, Per Dr. Cathie Hoops proceed with Venofer.

## 2023-01-04 NOTE — Assessment & Plan Note (Signed)
Encourage oral hydration and avoid nephrotoxins.  Previous workup negative for M protein and only slightly increased light chain ratio.  Nonspecific.   Negative M protein on 24-hour urine protein electrophoresis.

## 2023-01-04 NOTE — Assessment & Plan Note (Addendum)
Status post IV Venofer treatments.  She tolerated well. Labs are reviewed and discussed with patient. Lab Results  Component Value Date   HGB 8.9 (L) 12/31/2022   TIBC 298 12/31/2022   IRONPCTSAT 36 (H) 12/31/2022   FERRITIN 153 12/31/2022   Ferritin not at  goal.  Hemoglobin 8.9 , less than 11. Recommend Venofer weekly x 2 to further improve iron level.  Retacrit 20,000 units every 4 weeks

## 2023-01-06 ENCOUNTER — Inpatient Hospital Stay: Payer: Medicare Other

## 2023-01-06 VITALS — BP 129/68 | HR 68

## 2023-01-06 DIAGNOSIS — N184 Chronic kidney disease, stage 4 (severe): Secondary | ICD-10-CM | POA: Diagnosis not present

## 2023-01-06 DIAGNOSIS — D631 Anemia in chronic kidney disease: Secondary | ICD-10-CM

## 2023-01-06 MED ORDER — EPOETIN ALFA-EPBX 20000 UNIT/ML IJ SOLN
20000.0000 [IU] | Freq: Once | INTRAMUSCULAR | Status: AC
  Administered 2023-01-06: 20000 [IU] via SUBCUTANEOUS
  Filled 2023-01-06: qty 1

## 2023-01-06 NOTE — Progress Notes (Signed)
Per RN bp 123/68

## 2023-01-07 ENCOUNTER — Ambulatory Visit (INDEPENDENT_AMBULATORY_CARE_PROVIDER_SITE_OTHER): Payer: Medicare Other | Admitting: Clinical

## 2023-01-07 DIAGNOSIS — F332 Major depressive disorder, recurrent severe without psychotic features: Secondary | ICD-10-CM

## 2023-01-07 DIAGNOSIS — F411 Generalized anxiety disorder: Secondary | ICD-10-CM

## 2023-01-07 NOTE — Progress Notes (Signed)
[facility] Counselor/Therapist Progress Note  Patient ID: Abigail Figueroa, MRN: [id number],    Date: 01/07/2023  Time Spent: 12:35pm - 1:20pm : 45 minutes   Treatment Type: Individual Therapy  Reported Symptoms: none reported during today's session  Mental Status Exam: Appearance:  Neat and Well Groomed     Behavior: Appropriate  Motor: Normal  Speech/Language:  Clear and Coherent  Affect: Appropriate  Mood: normal  Thought process: normal  Thought content:   WNL  Sensory/Perceptual disturbances:   WNL  Orientation: oriented to person, place, and situation  Attention: Good  Concentration: Good  Memory: WNL  Fund of knowledge:  Good  Insight:   Good  Judgment:  Good  Impulse Control: Good   Risk Assessment: Danger to Self:  No Patient denied current suicidal ideation  Self-injurious Behavior: No Danger to Others: No Patient denied current homicidal ideation Duty to Warn:no Physical Aggression / Violence:No  Access to Firearms a concern: No  Gang Involvement:No   Subjective: Patient reported she was recently diagnosed with "pre-Parkinson's Disease" by a neurologist. Patient reported she was recently prescribed medication to treat Parkinson's Disease.  Patient stated, "well it's been rough" in response to the recent diagnosis. Patient reported her faith is a source of strength for patient and patient reported she is finding it difficult to pray in response to recent diagnosis. Patient stated, "I just need a break". Patient reported she has been exercising, eating healthy, and educating herself about Parkinson's Disease. Patient stated, "I'm trying to think positive". Patient reported her family has been supportive of patient and patient reported her husband has been attending medical appointments with patient. Patient reported she is interested in attending a support group and plans to discuss attendance with her daughter. Patient stated, "I haven't touched it" in  response to patient's gratitude journal. Patient reported she is grateful for her husband's response and support to recent diagnosis. Patient stated, "Im grateful for being here". Patient reported she is grateful for her grandson and granddaughter. Patient stated, "happy" in response to today's mood.  Interventions: Cognitive Behavioral Therapy. Clinician conducted session via caregility video from clinician's home office. Patient provided verbal consent to proceed with telehealth session and is aware of limitations of telephone or video visits. Patient participated in session from patient's home. Provided supportive therapy, reflective listening, and validation as patient discussed recent diagnosis. Assisted patient in discussing and exploring/identifying patient's thoughts/feelings in response to recent diagnosis. Discussed sources patient draws strength from and the impact of recent diagnosis on patient's sources of strength. Provided psycho education related to self care and explored strategies patient has implemented for self care. Discussed resources for education and support groups related to Parkinson's Disease and provided patient with resource information. Reviewed patient's gratitude journal. Assisted patient in exploring and identifying areas of gratitude. Assessed patient's mood. Clinician requested patient continue gratitude journal for homework.   Collaboration of Care: not required at this time   Diagnosis:  Major Depressive Disorder, recurrent, severe without psychotic features   Generalized anxiety disorder   Plan: Patient is to utilize [employer] Therapy, thought re-framing, relaxation techniques, mindfulness, effective communication, and coping strategies to decrease symptoms associated with Major Depressive Disorder and Generalized Anxiety Disorder. Frequency: bi-weekly  Modality: individual      Long-term goal:   Patient stated, "I want to see a change in the anxiety".       Reduce overall level, frequency, and intensity of the symptoms of anxiety and depression as evidenced by decreased  sadness, feelings of anxiety, feeling "lost", lack of energy, difficulty falling and staying asleep, changes in concentration, psychomotor retardation, rapid heart rate, excessive worry, restlessness, feeling jittery, and feeling on edge from 5 to 6 days per week to 0 to 1 days per week per patient reported for at least 3 consecutive months.    Target Date: 02/06/23  Progress: progressing    Short-term goal:  Develop coping strategies to utilize in response to symptoms of depression/anxiety and stressors   Target Date: 02/06/23  Progress: progressing    Verbally express patient's thoughts and feelings to others and utilize effective communication strategies when expressing patient's thoughts/feelings   Target Date: 02/06/23  Progress: progressing    Verbalize an understanding of the relationship between symptoms of anxiety and the impact on patient's thought patterns and behaviors   Target Date: 02/06/23  Progress: progressing    Doree Barthel, LCSW

## 2023-01-07 NOTE — Progress Notes (Signed)
                Karen Sharpe, LCSW 

## 2023-01-11 ENCOUNTER — Inpatient Hospital Stay: Payer: Medicare Other

## 2023-01-11 VITALS — BP 128/72 | HR 65 | Temp 96.0°F | Resp 16

## 2023-01-11 DIAGNOSIS — N184 Chronic kidney disease, stage 4 (severe): Secondary | ICD-10-CM | POA: Diagnosis not present

## 2023-01-11 DIAGNOSIS — D631 Anemia in chronic kidney disease: Secondary | ICD-10-CM | POA: Diagnosis not present

## 2023-01-11 MED ORDER — SODIUM CHLORIDE 0.9 % IV SOLN
INTRAVENOUS | Status: DC
  Filled 2023-01-11: qty 250

## 2023-01-11 MED ORDER — SODIUM CHLORIDE 0.9 % IV SOLN
200.0000 mg | Freq: Once | INTRAVENOUS | Status: AC
  Administered 2023-01-11: 200 mg via INTRAVENOUS
  Filled 2023-01-11: qty 200

## 2023-01-11 NOTE — Patient Instructions (Signed)
Iron Sucrose Injection What is this medication? IRON SUCROSE (EYE ern SOO krose) treats low levels of iron (iron deficiency anemia) in people with kidney disease. Iron is a mineral that plays an important role in making red blood cells, which carry oxygen from your lungs to the rest of your body. This medicine may be used for other purposes; ask your health care provider or pharmacist if you have questions. COMMON BRAND NAME(S): Venofer What should I tell my care team before I take this medication? They need to know if you have any of these conditions: Anemia not caused by low iron levels Heart disease High levels of iron in the blood Kidney disease Liver disease An unusual or allergic reaction to iron, other medications, foods, dyes, or preservatives Pregnant or trying to get pregnant Breastfeeding How should I use this medication? This medication is for infusion into a vein. It is given in a hospital or clinic setting. Talk to your care team about the use of this medication in children. While this medication may be prescribed for children as young as 2 years for selected conditions, precautions do apply. Overdosage: If you think you have taken too much of this medicine contact a poison control center or emergency room at once. NOTE: This medicine is only for you. Do not share this medicine with others. What if I miss a dose? Keep appointments for follow-up doses. It is important not to miss your dose. Call your care team if you are unable to keep an appointment. What may interact with this medication? Do not take this medication with any of the following: Deferoxamine Dimercaprol Other iron products This medication may also interact with the following: Chloramphenicol Deferasirox This list may not describe all possible interactions. Give your health care provider a list of all the medicines, herbs, non-prescription drugs, or dietary supplements you use. Also tell them if you smoke,  drink alcohol, or use illegal drugs. Some items may interact with your medicine. What should I watch for while using this medication? Visit your care team regularly. Tell your care team if your symptoms do not start to get better or if they get worse. You may need blood work done while you are taking this medication. You may need to follow a special diet. Talk to your care team. Foods that contain iron include: whole grains/cereals, dried fruits, beans, or peas, leafy green vegetables, and organ meats (liver, kidney). What side effects may I notice from receiving this medication? Side effects that you should report to your care team as soon as possible: Allergic reactions--skin rash, itching, hives, swelling of the face, lips, tongue, or throat Low blood pressure--dizziness, feeling faint or lightheaded, blurry vision Shortness of breath Side effects that usually do not require medical attention (report to your care team if they continue or are bothersome): Flushing Headache Joint pain Muscle pain Nausea Pain, redness, or irritation at injection site This list may not describe all possible side effects. Call your doctor for medical advice about side effects. You may report side effects to FDA at 1-800-FDA-1088. Where should I keep my medication? This medication is given in a hospital or clinic. It will not be stored at home. NOTE: This sheet is a summary. It may not cover all possible information. If you have questions about this medicine, talk to your doctor, pharmacist, or health care provider.  2024 Elsevier/Gold Standard (2022-09-24 00:00:00)

## 2023-01-13 ENCOUNTER — Ambulatory Visit (INDEPENDENT_AMBULATORY_CARE_PROVIDER_SITE_OTHER): Payer: Medicare Other | Admitting: Allergy & Immunology

## 2023-01-13 ENCOUNTER — Encounter: Payer: Self-pay | Admitting: Allergy & Immunology

## 2023-01-13 DIAGNOSIS — J302 Other seasonal allergic rhinitis: Secondary | ICD-10-CM

## 2023-01-13 DIAGNOSIS — N184 Chronic kidney disease, stage 4 (severe): Secondary | ICD-10-CM

## 2023-01-13 DIAGNOSIS — G20A1 Parkinson's disease without dyskinesia, without mention of fluctuations: Secondary | ICD-10-CM | POA: Diagnosis not present

## 2023-01-13 DIAGNOSIS — J3089 Other allergic rhinitis: Secondary | ICD-10-CM

## 2023-01-13 MED ORDER — MONTELUKAST SODIUM 10 MG PO TABS: 10.0000 mg | ORAL_TABLET | Freq: Every day | ORAL | 1 refills | Status: DC

## 2023-01-13 MED ORDER — AZELASTINE-FLUTICASONE 137-50 MCG/ACT NA SUSP: 2.0000 | Freq: Two times a day (BID) | NASAL | 5 refills | Status: DC

## 2023-01-13 MED ORDER — PREDNISONE 10 MG PO TABS: ORAL_TABLET | ORAL | 0 refills | Status: DC

## 2023-01-13 NOTE — Patient Instructions (Addendum)
1. Perennial and seasonal allergic rhinitis (red cedar, ragweed, trees, outdoor molds, dog, cat, and dust mite) - Add on Singulair  - Start Claritin (loratadine) up to twice daily. - Continue fluticasone one spray per nostril up to twice daily. - Add on azelastine one spray per nostril up to twice daily. - Consider allergy shots for long term control.  2. Return in about 1 year (around 01/13/2024). You can have the follow up appointment with Dr. Dellis Anes or a Nurse Practicioner (our Nurse Practitioners are excellent and always have Physician oversight!).    Please inform us of any Emergency Department visits, hospitalizations, or changes in symptoms. Call us before going to the ED for breathing or allergy symptoms since we might be able to fit you in for a sick visit. Feel free to contact us anytime with any questions, problems, or concerns.  It was a pleasure to talk to you today today!  Websites that have reliable patient information: 1. American Academy of Asthma, Allergy, and Immunology: www.aaaai.org 2. Food Allergy Research and Education (FARE): foodallergy.org 3. Mothers of Asthmatics: http://www.asthmacommunitynetwork.org 4. American College of Allergy, Asthma, and Immunology: www.acaai.org   COVID-19 Vaccine Information can be found at: [email or web address].nl For questions related to vaccine distribution or appointments, please email [email or web address] or call [phone].     "Like" [place] on Facebook and Instagram for our latest updates!      A healthy democracy works best when [employer] participate! Make sure you are registered to vote! If you have moved or changed any of your contact information, you will need to get this updated before voting! Scan the QR codes below to learn more!

## 2023-01-13 NOTE — Progress Notes (Signed)
RE: Abigail Figueroa MRN: [id number] DOB: October 24, 1960 Date of Telemedicine Visit: 01/13/2023  Referring provider: Debera Lat, PA-C Primary care provider: [name], MD  Chief Complaint: Allergic Rhinitis , Angioedema, Follow-up (Running nose and watery eye. Pt stated she was diagnose of Parkinson's disease recently.), Nasal Congestion, and Headache   Telemedicine Follow Up Visit via Telephone: I connected with Abigail Figueroa for a follow up on 01/13/23 by telephone and verified that I am speaking with the correct person using two identifiers.   I discussed the limitations, risks, security and privacy concerns of performing an evaluation and management service by telephone and the availability of in person appointments. I also discussed with the patient that there may be a patient responsible charge related to this service. The patient expressed understanding and agreed to proceed.  Patient is at home.  Provider is at the office.  Visit start time: 11:15 AM Visit end time: 11:38 AM Insurance consent/check in by: Joni Reining Medical consent and medical assistant/nurse: Patience  History of Present Illness:  She is a 62 y.o. female, who is being followed for seasonal and perennial allergic rhinitis. Her previous allergy office visit was in February 2024 with myself.  At that time, we changed her to Claritin up to twice daily due to her recent diagnosis with chronic kidney disease.  We also continue with Flonase and added on Astelin.  Since last visit, she has not done well. She is having watery nose and congestion and headache. She has been having rhinorrhea and itchy nose. She is having congestion and is having some problems breathing through her nose at night. This gets very bad during that time. She is having headaches.   She has Flonase and Claritin. She is using the Flonase twice daily.  She has not been using Astelin.  She has not been on antibiotics.  She has not been on  prednisone.  She has tried using some Mucinex, but in general she is very careful with what she uses due to her kidney disease.  She is open to allergy shots, but she lives in [place] so getting to the office once a week would be a problem.  She has been on shots in the past, but this was decades ago when she was allowed to get them at her house herself.  Kidneys are OK. She has been stable. She is trying to avoid dialysis.  Her latest creatinine was 1.90 around a month ago.  She was recently diagnosed with Parkinson's disease. This was made one month ago. She is now on Sinemet now and still has trembling, but it is not as bad.   Otherwise, there have been no changes to her past medical history, surgical history, family history, or social history.  Assessment and Plan:  Abigail Figueroa is a 62 y.o. female with:  Angioedema - with negative testing to the most common foods   Seasonal and perennial allergic rhinitis (ragweeds, trees, outdoor molds, cat, dust mite) - not well controlled today    Stage IV chronic kidney disease  Parkinson's disease    1. Perennial and seasonal allergic rhinitis (red cedar, ragweed, trees, outdoor molds, dog, cat, and dust mite) - Add on Singulair  - Start Claritin (loratadine) up to twice daily. - Continue fluticasone one spray per nostril up to twice daily. - Add on azelastine one spray per nostril up to twice daily. - Consider allergy shots for long term control.  2. Return in about 1 year (around 01/13/2024). You can  have the follow up appointment with Dr. Dellis Anes or a Nurse Practicioner (our Nurse Practitioners are excellent and always have Physician oversight!).     Diagnostics: None.  Medication List:  Current Outpatient Medications  Medication Sig Dispense Refill   amLODipine (NORVASC) 5 MG tablet Take 1.5 tablet (7.5mg ) by mouth daily. 180 tablet 1   ARIPiprazole (ABILIFY) 5 MG tablet Take 5 mg by mouth daily.     Azelastine-Fluticasone  (DYMISTA) 137-50 MCG/ACT SUSP Place 2 sprays into both nostrils in the morning and at bedtime. 23 g 5   BOTOX 100 UNITS SOLR injection Inject into the muscle every 3 (three) months.      busPIRone (BUSPAR) 15 MG tablet TAKE 1 TABLET BY MOUTH TWICE A DAY 180 tablet 1   carbamazepine (TEGRETOL-XR) 400 MG 12 hr tablet TAKE 1 TABLET BY MOUTH TWICE A DAY 180 tablet 2   carbidopa-levodopa (SINEMET IR) 25-100 MG tablet Take 1 tablet by mouth Three (3) times a day. Start with 1/2 pill three times a day, after a week can increase to 1 pill three times a day.     chlorproMAZINE (THORAZINE) 25 MG tablet Take 50 mg by mouth 2 (two) times daily as needed.     chlorzoxazone (PARAFON) 500 MG tablet Take by mouth 4 (four) times daily as needed for muscle spasms.     clotrimazole-betamethasone (LOTRISONE) cream Apply 1 Application topically 2 (two) times daily. 45 g 3   EMGALITY 120 MG/ML SOAJ Inject into the skin as needed.     EPINEPHrine 0.3 mg/0.3 mL IJ SOAJ injection Inject into the muscle.     ezetimibe (ZETIA) 10 MG tablet Take 1 tablet (10 mg total) by mouth daily. 90 tablet 3   fluocinolone (SYNALAR) 0.01 % external solution Apply topically as needed.     FLUoxetine (PROZAC) 10 MG capsule Take 40 mg by mouth daily.     fluticasone (FLONASE) 50 MCG/ACT nasal spray Place 1-2 sprays into both nostrils as needed for allergies or rhinitis.     furosemide (LASIX) 40 MG tablet Take 1 tablet (40 mg total) by mouth daily. 90 tablet 3   lamoTRIgine (LAMICTAL) 100 MG tablet Take 100 mg by mouth 2 (two) times daily.     Lancets (ONETOUCH ULTRASOFT) lancets Test fasting each morning and 2 hours before supper. Retest if having hypoglycemic symptoms. (Patient taking differently: 1 each by Other route as needed for other. Test fasting each morning and 2 hours before supper. Retest if having hypoglycemic symptoms.) 100 each 12   metoprolol tartrate (LOPRESSOR) 100 MG tablet TAKE 1 TABLET BY MOUTH TWICE A DAY 180 tablet 3    montelukast (SINGULAIR) 10 MG tablet Take 1 tablet (10 mg total) by mouth at bedtime. 90 tablet 1   ONETOUCH VERIO test strip TEST FASTING GLUCOSE DAILY AS DIRECTED 100 strip 3   predniSONE (DELTASONE) 10 MG tablet Take two tablets (20mg ) twice daily for three days, then one tablet (10mg ) twice daily for three days, then STOP. 18 tablet 0   promethazine (PHENERGAN) 25 MG tablet Take 25 mg by mouth every 4 (four) hours as needed.     rosuvastatin (CRESTOR) 20 MG tablet TAKE 1 TABLET BY MOUTH EVERY DAY 90 tablet 1   Semaglutide,0.25 or 0.5MG /DOS, (OZEMPIC, 0.25 OR 0.5 MG/DOSE,) 2 MG/3ML SOPN Inject 0.25 mg into the skin once a week. (Patient taking differently: Inject 0.5 mg into the skin once a week.) 3 mL 0   tretinoin (RETIN-A) 0.025 % cream Apply  topically at bedtime.     triamcinolone ointment (KENALOG) 0.1 % Apply 1 Application topically 2 (two) times daily.     Azelastine HCl 0.15 % SOLN PLACE 2 SPRAYS INTO BOTH NOSTRILS 2 (TWO) TIMES DAILY (Patient taking differently: Place 2 sprays into both nostrils 2 (two) times daily. Taking as needed) 30 mL 11   diclofenac Sodium (VOLTAREN) 1 % GEL Apply topically as needed. (Patient not taking: Reported on 01/13/2023)     loratadine (CLARITIN) 10 MG tablet Take 1 tablet (10 mg total) by mouth in the morning and at bedtime. 60 tablet 11   No current facility-administered medications for this visit.   Allergies: Allergies  Allergen Reactions   Morphine Rash and Swelling   Morphine And Codeine Hives    Nausea and vomiting  Nausea and vomiting   Oxycodone Hives, Itching, Rash, Swelling and Nausea And Vomiting   Oxycodone Hcl Swelling, Hives and Itching   Aimovig [Erenumab-Aooe]     Questionable lip swelling angoiedema   Augmentin [Amoxicillin-Pot Clavulanate] Hives   Morphine Sulfate Itching and Nausea And Vomiting    REACTION: swelling   I reviewed her past medical history, social history, family history, and environmental history and no  significant changes have been reported from previous visits.  Review of Systems  Objective:  Physical exam not obtained as encounter was done via telephone.   Previous notes and tests were reviewed.  I discussed the assessment and treatment plan with the patient. The patient was provided an opportunity to ask questions and all were answered. The patient agreed with the plan and demonstrated an understanding of the instructions.   The patient was advised to call back or seek an in-person evaluation if the symptoms worsen or if the condition fails to improve as anticipated.  I provided 23 minutes of non-face-to-face time during this encounter.  It was my pleasure to participate in Hayden Codd's care today. Please feel free to contact me with any questions or concerns.   Sincerely,  Alfonse Spruce, MD

## 2023-01-14 ENCOUNTER — Encounter: Payer: Medicare Other | Admitting: Clinical

## 2023-01-14 NOTE — Progress Notes (Signed)
Doree Barthel, LCSWThis encounter was created in error - please disregard.

## 2023-01-17 ENCOUNTER — Ambulatory Visit: Payer: Medicare Other | Attending: Family Medicine | Admitting: Audiologist

## 2023-01-17 ENCOUNTER — Other Ambulatory Visit: Payer: Self-pay | Admitting: *Deleted

## 2023-01-17 DIAGNOSIS — H47233 Glaucomatous optic atrophy, bilateral: Secondary | ICD-10-CM | POA: Diagnosis not present

## 2023-01-17 DIAGNOSIS — E559 Vitamin D deficiency, unspecified: Secondary | ICD-10-CM | POA: Diagnosis not present

## 2023-01-17 DIAGNOSIS — H469 Unspecified optic neuritis: Secondary | ICD-10-CM | POA: Diagnosis not present

## 2023-01-17 DIAGNOSIS — G20C Parkinsonism, unspecified: Secondary | ICD-10-CM | POA: Diagnosis not present

## 2023-01-17 DIAGNOSIS — G43E19 Chronic migraine with aura, intractable, without status migrainosus: Secondary | ICD-10-CM | POA: Diagnosis not present

## 2023-01-17 DIAGNOSIS — E113292 Type 2 diabetes mellitus with mild nonproliferative diabetic retinopathy without macular edema, left eye: Secondary | ICD-10-CM | POA: Diagnosis not present

## 2023-01-17 DIAGNOSIS — H18603 Keratoconus, unspecified, bilateral: Secondary | ICD-10-CM | POA: Diagnosis not present

## 2023-01-17 LAB — HM DIABETES EYE EXAM

## 2023-01-17 MED ORDER — FUROSEMIDE 40 MG PO TABS: 40.0000 mg | ORAL_TABLET | Freq: Every day | ORAL | 3 refills | Status: DC

## 2023-01-18 ENCOUNTER — Ambulatory Visit: Payer: Medicare Other | Admitting: Family Medicine

## 2023-01-21 ENCOUNTER — Ambulatory Visit: Payer: Medicare Other | Admitting: Clinical

## 2023-02-01 ENCOUNTER — Inpatient Hospital Stay: Payer: Medicare Other | Attending: Oncology

## 2023-02-01 ENCOUNTER — Inpatient Hospital Stay: Payer: Medicare Other

## 2023-02-01 VITALS — BP 116/77 | HR 69

## 2023-02-01 DIAGNOSIS — N184 Chronic kidney disease, stage 4 (severe): Secondary | ICD-10-CM | POA: Insufficient documentation

## 2023-02-01 DIAGNOSIS — D631 Anemia in chronic kidney disease: Secondary | ICD-10-CM | POA: Diagnosis not present

## 2023-02-01 LAB — HEMOGLOBIN AND HEMATOCRIT, BLOOD
HCT: 29.8 % — ABNORMAL LOW (ref 36.0–46.0)
Hemoglobin: 9.6 g/dL — ABNORMAL LOW (ref 12.0–15.0)

## 2023-02-01 MED ORDER — EPOETIN ALFA-EPBX 20000 UNIT/ML IJ SOLN
20000.0000 [IU] | Freq: Once | INTRAMUSCULAR | Status: AC
  Administered 2023-02-01: 20000 [IU] via SUBCUTANEOUS
  Filled 2023-02-01: qty 1

## 2023-02-03 ENCOUNTER — Ambulatory Visit: Payer: Medicare Other | Attending: Family Medicine | Admitting: Audiologist

## 2023-02-04 ENCOUNTER — Other Ambulatory Visit: Payer: Self-pay | Admitting: Physician Assistant

## 2023-02-04 ENCOUNTER — Ambulatory Visit (INDEPENDENT_AMBULATORY_CARE_PROVIDER_SITE_OTHER): Payer: Medicare Other | Admitting: Clinical

## 2023-02-04 DIAGNOSIS — R569 Unspecified convulsions: Secondary | ICD-10-CM

## 2023-02-04 DIAGNOSIS — F411 Generalized anxiety disorder: Secondary | ICD-10-CM | POA: Diagnosis not present

## 2023-02-04 DIAGNOSIS — F332 Major depressive disorder, recurrent severe without psychotic features: Secondary | ICD-10-CM | POA: Diagnosis not present

## 2023-02-04 NOTE — Progress Notes (Signed)
[facility] Counselor/Therapist Progress Note  Patient ID: Abigail Figueroa, MRN: [id number],    Date: 02/04/2023  Time Spent: 12:32pm - 1:20pm : 48 minutes   Treatment Type: Individual Therapy  Reported Symptoms: Patient reported "a little low" in response to mood  Mental Status Exam: Appearance:  Neat and Well Groomed     Behavior: Appropriate  Motor: Normal  Speech/Language:  Clear and Coherent  Affect: Appropriate  Mood: Patient stated, "a little low" in response to mood  Thought process: normal  Thought content:   WNL  Sensory/Perceptual disturbances:   WNL  Orientation: oriented to person, place, and situation  Attention: Good  Concentration: Good  Memory: WNL  Fund of knowledge:  Good  Insight:   Good  Judgment:  Good  Impulse Control: Good   Risk Assessment: Danger to Self:  No Patient denied current suicidal ideation  Self-injurious Behavior: No Danger to Others: No Patient denied current homicidal ideation Duty to Warn:no Physical Aggression / Violence:No  Access to Firearms a concern: No  Gang Involvement:No   Subjective: Patient stated, "I've been hanging in there" in response to events since last session. Patient reported she continues to experience tremors and reported patient's physician referred patient for occupational therapy. Patient stated, "I'm ready" in response to participation in occupational therapy. Patient reported she missed recent therapy appointments due to the impact of the tremors. Patient reported patient's psychiatrist recently increased patient's dosage of Abilify and patient stated, "its helping a little" in regards to patient's mood. Patient stated, "its been ok" in response to patient's mood since last session. Patient stated, "its a little low" in response to patient's mood today. Patient reported tremors are a trigger for "low" mood today. Patient stated, "Its physically, its mentally, its all of that" in response to the  impact of tremors and reported frustration in response to tremors. During today's session patient shared several entries in patient's gratitude journal, such as, being grateful for the medication for tremors, patient's neurologist, patient's children/grandchildren, improvement in communication with husband, and stated, "that I don't get angry with him (husband) like I use to". Patient reported the gratitude journal allows patient the opportunity to review patient's entries and shift patient's focus to a positive perspective.   Interventions: Cognitive Behavioral Therapy. Clinician conducted session via caregility video from clinician's home office. Patient provided verbal consent to proceed with telehealth session and is aware of limitations of telephone or video visits. Patient participated in session from patient's home. Reviewed events since last session. Discussed patient's recent missed appointments and explored barriers to attendance. Discussed recent changes in patient's medications and the outcome. Assessed patient's mood since last session and assessed patient's current mood. Assisted patient in exploring and identifying triggers for decline in mood. Assisted patient in discussing and identifying thoughts/feelings triggered by tremors.  Discussed supportive resources related to Parkinson's Disease and provided information. Reviewed patient's gratitude journal. Clinician requested patient continue gratitude journal for homework.    Collaboration of Care: not required at this time   Diagnosis:  Major Depressive Disorder, recurrent, severe without psychotic features   Generalized anxiety disorder   Plan: Patient is to utilize [employer] Therapy, thought re-framing, relaxation techniques, mindfulness, effective communication, and coping strategies to decrease symptoms associated with Major Depressive Disorder and Generalized Anxiety Disorder. Frequency: bi-weekly  Modality: individual       Long-term goal:   Patient stated, "I want to see a change in the anxiety".      Reduce overall level,  frequency, and intensity of the symptoms of anxiety and depression as evidenced by decreased sadness, feelings of anxiety, feeling "lost", lack of energy, difficulty falling and staying asleep, changes in concentration, psychomotor retardation, rapid heart rate, excessive worry, restlessness, feeling jittery, and feeling on edge from 5 to 6 days per week to 0 to 1 days per week per patient reported for at least 3 consecutive months.    Target Date: 02/06/23  Progress: progressing    Short-term goal:  Develop coping strategies to utilize in response to symptoms of depression/anxiety and stressors   Target Date: 02/06/23  Progress: progressing    Verbally express patient's thoughts and feelings to others and utilize effective communication strategies when expressing patient's thoughts/feelings   Target Date: 02/06/23  Progress: progressing    Verbalize an understanding of the relationship between symptoms of anxiety and the impact on patient's thought patterns and behaviors   Target Date: 02/06/23  Progress: progressing          Doree Barthel, LCSW

## 2023-02-04 NOTE — Progress Notes (Signed)
                Dezi Schaner, LCSW 

## 2023-02-07 NOTE — Telephone Encounter (Signed)
Requested medication (s) are due for refill today: yes  Requested medication (s) are on the active medication list: yes  Last refill:  06/04/22  Future visit scheduled: no  Notes to clinic:  Unable to refill per protocol due to failed labs, no updated results.      Requested Prescriptions  Pending Prescriptions Disp Refills   TEGRETOL-XR 400 MG 12 hr tablet [Pharmacy Med Name: TEGRETOL XR 400 MG TABLET] 180 tablet 2    Sig: TAKE 1 TABLET BY MOUTH TWICE A DAY     Neurology:  Anticonvulsants - carbamazepine Failed - 02/04/2023  6:28 PM      Failed - AST in normal range and within 360 days    AST  Date Value Ref Range Status  07/13/2022 218 (H) 15 - 41 U/L Final   SGOT(AST)  Date Value Ref Range Status  08/16/2014 54 (H) U/L Final    Comment:    15-41 NOTE: New Reference Range  07/09/14          Failed - ALT in normal range and within 360 days    ALT  Date Value Ref Range Status  07/13/2022 80 (H) 0 - 44 U/L Final   SGPT (ALT)  Date Value Ref Range Status  08/16/2014 49 U/L Final    Comment:    14-54 NOTE: New Reference Range  07/09/14          Failed - Carbamazepine (serum) in normal range and within 360 days    Carbamazepine, Total  Date Value Ref Range Status  08/09/2013 4.9 4.0 - 12.0 mcg/mL Final   Carbamazepine (Tegretol), S  Date Value Ref Range Status  12/16/2021 5.2 4.0 - 12.0 ug/mL Final    Comment:             In conjunction with other antiepileptic drugs                                Therapeutic  4.0 -  8.0                                Toxicity     9.0 - 12.0                                    Carbamazepine alone                                Therapeutic  8.0 - 12.0                                 Detection Limit =  2.0                           <2.0 indicates None Detected          Failed - HGB in normal range and within 360 days    Hemoglobin  Date Value Ref Range Status  02/01/2023 9.6 (L) 12.0 - 15.0 g/dL Final  16/02/9603 8.9 (L)  12.0 - 15.0 g/dL Final  54/01/8118 9.4 (L) 11.1 - 15.9 g/dL Final         Failed - HCT in  normal range and within 360 days    HCT  Date Value Ref Range Status  02/01/2023 29.8 (L) 36.0 - 46.0 % Final    Comment:    Performed at [facility], [address] [place], [place] [address]   Hematocrit  Date Value Ref Range Status  06/15/2022 29.1 (L) 34.0 - 46.6 % Final         Failed - Cr in normal range and within 360 days    Creat  Date Value Ref Range Status  02/28/2017 1.03 0.50 - 1.05 mg/dL Final    Comment:    For patients >24 years of age, the reference limit for Creatinine is approximately 13% higher for people identified as African-American. .    Creatinine, Ser  Date Value Ref Range Status  07/13/2022 2.24 (H) 0.44 - 1.00 mg/dL Final   Creatinine, POC  Date Value Ref Range Status  07/20/2016 NA mg/dL Final         Passed - WBC in normal range and within 360 days    WBC  Date Value Ref Range Status  09/21/2022 4.9 4.0 - 10.5 K/uL Final   WBC Count  Date Value Ref Range Status  12/31/2022 5.0 4.0 - 10.5 K/uL Final         Passed - PLT in normal range and within 360 days    Platelets  Date Value Ref Range Status  06/15/2022 255 150 - 450 x10E3/uL Final   Platelet Count  Date Value Ref Range Status  12/31/2022 176 150 - 400 K/uL Final         Passed - Na in normal range and within 360 days    Sodium  Date Value Ref Range Status  07/13/2022 139 135 - 145 mmol/L Final  06/15/2022 143 134 - 144 mmol/L Final  08/16/2014 144 mmol/L Final    Comment:    135-145 NOTE: New Reference Range  07/09/14          Passed - Completed PHQ-2 or PHQ-9 in the last 360 days      Passed - Valid encounter within last 12 months    Recent Outpatient Visits           2 months ago Nausea   [place] [facility] [name], PA-C   3 months ago Tremor   [place] [facility] [name], PA-C   3 months ago Tremor    [place] [facility] [name], PA-C   4 months ago Acute non-recurrent frontal sinusitis   [place] [facility] [name], PA-C   6 months ago CKD (chronic kidney disease) stage 4, GFR 15-29 ml/min [facility])   [place] [facility] [name], PA-C       Future Appointments             In 1 month Tresa Endo Clovis Pu, MD [facility] at [facility]   In 11 months Dellis Anes, Hetty Ely, MD [facility]

## 2023-02-10 DIAGNOSIS — M542 Cervicalgia: Secondary | ICD-10-CM | POA: Diagnosis not present

## 2023-02-10 DIAGNOSIS — M791 Myalgia, unspecified site: Secondary | ICD-10-CM | POA: Diagnosis not present

## 2023-02-10 DIAGNOSIS — G43719 Chronic migraine without aura, intractable, without status migrainosus: Secondary | ICD-10-CM | POA: Diagnosis not present

## 2023-02-11 ENCOUNTER — Ambulatory Visit: Payer: Medicare Other | Admitting: Clinical

## 2023-02-11 DIAGNOSIS — F332 Major depressive disorder, recurrent severe without psychotic features: Secondary | ICD-10-CM

## 2023-02-11 DIAGNOSIS — F411 Generalized anxiety disorder: Secondary | ICD-10-CM

## 2023-02-11 NOTE — Progress Notes (Signed)
[facility] Counselor/Therapist Progress Note  Patient ID: Abigail Figueroa, MRN: [id number],    Date: 02/11/2023  Time Spent: 12:33pm - 1:21pm : 48 minutes   Treatment Type: Individual Therapy  Reported Symptoms: none reported during today's session  Mental Status Exam: Appearance:  Neat     Behavior: Appropriate  Motor: Normal  Speech/Language:  Clear and Coherent  Affect: Appropriate  Mood: normal  Thought process: normal  Thought content:   WNL  Sensory/Perceptual disturbances:   WNL  Orientation: oriented to person, place, and situation  Attention: Good  Concentration: Good  Memory: WNL  Fund of knowledge:  Good  Insight:   Good  Judgment:  Good  Impulse Control: Good   Risk Assessment: Danger to Self:  No Patient denied current suicidal ideation  Self-injurious Behavior: No Danger to Others: No Patient denied current homicidal ideation Duty to Warn:no Physical Aggression / Violence:No  Access to Firearms a concern: No  Gang Involvement:No   Subjective: Patient stated, "going pretty good, hanging in there" in response to events since last session.  Patient stated on Monday "I was feeling real bad", "I didn't feel myself". Patient reported she feels her medication contributed to patient "feeling real bad" on Monday. Patient stated, "its been pretty good" in response to patient's mood since last session and stated, "I had a pretty good week". Patient reported she enjoys playing the board game monopoly, playing uno, playing spades, helping her daughter make wreaths/floral arrangements, and jigsaw puzzles. Patient reported she plans to ask her grandson to play monopoly with the family and plans to ask her husband to play uno with patient.  Patient stated, "it went pretty good" in response to patient's gratitude journal. Patient stated,  "it was something that has been weighing on me, its about the house". Patient reported needed repairs to her home have been a  trigger for depressed mood. Patient reported she was recently informed there is a weatherization program to assist with home repairs. Patient reported the recent information  "brought joy" to patient. Patient reported she has been able to spend time outside with her dog recently and is grateful for the time outside, her dog, and the recent weather. Patient stated, "half way" in response to long term goal. Patient stated, "I think I'm half way to that way" in response to patient's first short term goal. Patient stated, "I'm over half of that, I know I am" in response to patient's second short term goal. Patient stated, "once I see a change with Abigail Figueroa (husband) I know I'm doing a lot better". Patient stated, "almost half way" in response to patient's progress towards third short term goal.   Interventions: Cognitive Behavioral Therapy. Clinician conducted session via caregility video from clinician's home office. Patient provided verbal consent to proceed with telehealth session and is aware of limitations of telephone or video visits. Patient participated in session from patient's home. Reviewed events since last session. Discussed patient's concerns related to side effects of new medication. Discussed patient following up with patient's neurologist to discuss side effects of medication. Assisted patient in exploring and identifying activities patient enjoys to increase patient's participation in enjoyable activities. Provided psycho education related to depression. Reviewed patient's gratitude journal and assisted patient in identifying additional areas of gratitude. Discussed local resources for home repair/weatherization programs and provided resource information. Reviewed patient's goals for therapy and patient's progress towards goals. Clinician requested patient continue gratitude journal for homework.    Collaboration of Care: not required at  this time   Diagnosis:  Major Depressive Disorder, recurrent,  severe without psychotic features   Generalized anxiety disorder   Plan: Patient is to utilize [employer] Therapy, thought re-framing, relaxation techniques, mindfulness, effective communication, and coping strategies to decrease symptoms associated with Major Depressive Disorder and Generalized Anxiety Disorder. Frequency: weekly  Modality: individual      Long-term goal:   Patient stated, "I want to see a change in the anxiety".      Reduce overall level, frequency, and intensity of the symptoms of anxiety and depression as evidenced by decreased sadness, feelings of anxiety, feeling "lost", lack of energy, difficulty falling and staying asleep, changes in concentration, psychomotor retardation, rapid heart rate, excessive worry, restlessness, feeling jittery, and feeling on edge from 5 to 6 days per week to 0 to 1 days per week per patient reported for at least 3 consecutive months.   Target Date: 02/11/24  Progress: progressing    Short-term goal:  Develop coping strategies to utilize in response to symptoms of depression/anxiety and stressors Patient stated, "I think I'm half way to that way" Target Date: 08/12/23  Progress: progressing    Verbally express patient's thoughts and feelings to others and utilize effective communication strategies when expressing patient's thoughts/feelings    Target Date: 08/12/23  Progress: progressing    Verbalize an understanding of the relationship between symptoms of anxiety and the impact on patient's thought patterns and behaviors    Target Date: 08/12/23  Progress: progressing     Abigail Barthel, LCSW

## 2023-02-11 NOTE — Progress Notes (Signed)
                Dezi Schaner, LCSW 

## 2023-02-13 ENCOUNTER — Other Ambulatory Visit: Payer: Self-pay | Admitting: Cardiovascular Disease

## 2023-02-16 ENCOUNTER — Other Ambulatory Visit: Payer: Self-pay | Admitting: Physician Assistant

## 2023-02-16 DIAGNOSIS — M25572 Pain in left ankle and joints of left foot: Secondary | ICD-10-CM | POA: Diagnosis not present

## 2023-02-16 NOTE — Telephone Encounter (Signed)
Requested Prescriptions  Pending Prescriptions Disp Refills   rosuvastatin (CRESTOR) 20 MG tablet [Pharmacy Med Name: ROSUVASTATIN CALCIUM 20 MG TAB] 90 tablet 0    Sig: TAKE 1 TABLET BY MOUTH EVERY DAY     Cardiovascular:  Antilipid - Statins 2 Failed - 02/16/2023  1:21 PM      Failed - Cr in normal range and within 360 days    Creat  Date Value Ref Range Status  02/28/2017 1.03 0.50 - 1.05 mg/dL Final    Comment:    For patients >62 years of age, the reference limit for Creatinine is approximately 13% higher for people identified as African-American. .    Creatinine, Ser  Date Value Ref Range Status  07/13/2022 2.24 (H) 0.44 - 1.00 mg/dL Final   Creatinine, POC  Date Value Ref Range Status  07/20/2016 NA mg/dL Final         Failed - Lipid Panel in normal range within the last 12 months    Cholesterol, Total  Date Value Ref Range Status  10/19/2022 150 100 - 199 mg/dL Final   Cholesterol  Date Value Ref Range Status  08/16/2014 277 (H) mg/dL Final    Comment:    1-191 NOTE: New Reference Range  07/09/14    Ldl Cholesterol, Calc  Date Value Ref Range Status  08/16/2014 167 (H) mg/dL Final    Comment:    4-78 NOTE: New Reference Range:  07/09/14    LDL Cholesterol (Calc)  Date Value Ref Range Status  02/28/2017 190 (H) mg/dL (calc) Final    Comment:    LDL-C levels > or = 190 mg/dL may indicate familial  hypercholesterolemia (FH). Clinical assessment and  measurement of blood lipid levels should be  considered for all first degree relatives of  patients with an FH diagnosis.  For questions about testing for familial hypercholesterolemia, please call Engineer, materials at [email or web address]. Wardell Honour, et al. J National Lipid Association  Recommendations for Patient-Centered Management of  Dyslipidemia: Part 1 Journal of Clinical Lipidology  2015;9(2), 129-169. Reference range: <100 . Desirable range <100 mg/dL for primary prevention;   <70  mg/dL for patients with CHD or diabetic patients  with > or = 2 CHD risk factors. Marland Kitchen LDL-C is now calculated using the Martin-Hopkins  calculation, which is a validated novel method providing  better accuracy than the Friedewald equation in the  estimation of LDL-C.  Horald Pollen et al. Lenox Ahr. 2956;213(08): 2061-2068  (http://education.QuestDiagnostics.com/faq/FAQ164)    LDL Chol Calc (NIH)  Date Value Ref Range Status  10/19/2022 56 0 - 99 mg/dL Final   HDL Cholesterol  Date Value Ref Range Status  08/16/2014 77 mg/dL Final    Comment:    [phone] NOTE: New Reference Range:  07/09/14    HDL  Date Value Ref Range Status  10/19/2022 79 >39 mg/dL Final   Triglycerides  Date Value Ref Range Status  10/19/2022 82 0 - 149 mg/dL Final  96/29/5284 132 (H) mg/dL Final    Comment:    4-401 NOTE: New Reference Range  07/09/14          Passed - Patient is not pregnant      Passed - Valid encounter within last 12 months    Recent Outpatient Visits           3 months ago Nausea   [place] [facility] [name], PA-C   4 months ago Tremor   [place] [facility] Drubel,  Lillia Abed, PA-C   4 months ago Tremor   [facility] [address], [place], [place]   5 months ago Acute non-recurrent frontal sinusitis   [place] [employer] [name], PA-C   6 months ago CKD (chronic kidney disease) stage 4, GFR 15-29 ml/min [facility])   [place] [facility] [name], PA-C       Future Appointments             In 1 month Tresa Endo Clovis Pu, MD [facility] at [facility]   In 11 months Dellis Anes Hetty Ely, MD [place] [facility]

## 2023-02-17 DIAGNOSIS — D3501 Benign neoplasm of right adrenal gland: Secondary | ICD-10-CM | POA: Diagnosis not present

## 2023-02-17 DIAGNOSIS — R947 Abnormal results of other endocrine function studies: Secondary | ICD-10-CM | POA: Diagnosis not present

## 2023-02-17 DIAGNOSIS — D3502 Benign neoplasm of left adrenal gland: Secondary | ICD-10-CM | POA: Diagnosis not present

## 2023-02-18 ENCOUNTER — Encounter: Payer: Self-pay | Admitting: Family Medicine

## 2023-02-18 ENCOUNTER — Encounter: Payer: Self-pay | Admitting: Pharmacist

## 2023-02-18 NOTE — Progress Notes (Signed)
02/18/2023  Patient ID: Abigail Figueroa, female   DOB: 12-24-1960, 62 y.o.   MRN: [id number]  Pharmacy Quality Measure Review  This patient is appearing on report for being at risk of failing the measure for Statin Use in Persons with Diabetes (SUPD) medications this calendar year.   From review of chart, note PCP sent renewal of rosuvastatin 20 mg prescription to pharmacy on 02/16/2023. Follow up with [facility] today and speak with RPh who confirms prescription picked up on 10/16.   No further action needed.  Estelle Grumbles, PharmD, [facility] [phone]

## 2023-02-22 DIAGNOSIS — D631 Anemia in chronic kidney disease: Secondary | ICD-10-CM | POA: Diagnosis not present

## 2023-02-22 DIAGNOSIS — N184 Chronic kidney disease, stage 4 (severe): Secondary | ICD-10-CM | POA: Diagnosis not present

## 2023-02-22 DIAGNOSIS — E119 Type 2 diabetes mellitus without complications: Secondary | ICD-10-CM | POA: Diagnosis not present

## 2023-02-22 DIAGNOSIS — N1832 Chronic kidney disease, stage 3b: Secondary | ICD-10-CM | POA: Diagnosis not present

## 2023-02-25 ENCOUNTER — Ambulatory Visit (INDEPENDENT_AMBULATORY_CARE_PROVIDER_SITE_OTHER): Payer: Medicare Other | Admitting: Clinical

## 2023-02-25 DIAGNOSIS — F411 Generalized anxiety disorder: Secondary | ICD-10-CM

## 2023-02-25 DIAGNOSIS — F332 Major depressive disorder, recurrent severe without psychotic features: Secondary | ICD-10-CM | POA: Diagnosis not present

## 2023-02-25 NOTE — Progress Notes (Signed)
                Dezi Schaner, LCSW 

## 2023-02-25 NOTE — Progress Notes (Signed)
[facility] Counselor/Therapist Progress Note  Patient ID: Abigail Figueroa, MRN: [id number],    Date: 02/25/2023  Time Spent: 12:35pm - 1:28pm : 53 minutes   Treatment Type: Individual Therapy  Reported Symptoms: Patient reported depressed mood  Mental Status Exam: Appearance:  Neat and Well Groomed     Behavior: Appropriate  Motor: Normal  Speech/Language:  Clear and Coherent  Affect: Appropriate  Mood: depressed  Thought process: normal  Thought content:   WNL  Sensory/Perceptual disturbances:   WNL  Orientation: oriented to person, place, and situation  Attention: Good  Concentration: Good  Memory: WNL  Fund of knowledge:  Good  Insight:   Good  Judgment:  Good  Impulse Control: Good   Risk Assessment: Danger to Self:  No Patient denied current suicidal ideation  Self-injurious Behavior: No Danger to Others: No Patient denied current homicidal ideation Duty to Warn:no Physical Aggression / Violence:No  Access to Firearms a concern: No  Gang Involvement:No   Subjective: Patient stated, "it's been going" in response to events since last session. Patient stated, "just a little depressed but I'm hanging in there" in response to patient's mood since last session. Patient reported she was recently diagnosed with gout and is currently wearing a boot to stabilize her foot. Patient stated,  "just dealing with everything, every time it seems like something new" in reference to patient's health and in response to triggers for depressed mood. Patient reported she reached out to the resource for home repairs/weatherization discussed during previous session but has not been able to speak to someone. Patient stated, "it helps me because then we can go from there" in response to resource information. Patient reported home repairs continue to be a stressor and reported finances are a stressor at times. Patient stated, "I did one thing" in response to patient's gratitude  journal. During today's session, patient shared the following entries in patient's gratitude journal: being able to go outside and enjoy the weather, "a portion of my health", strength, "my mental status is doing better", "I'm able to deal with the negative coming my way", "just being free", "thinking about the things God has blessed me with". Patient stated, "I'm thankful and Im blessed to have the family that I have".   Interventions: Cognitive Behavioral Therapy. Clinician conducted session via caregility video from clinician's home office. Patient provided verbal consent to proceed with telehealth session and is aware of limitations of telephone or video visits. Patient participated in session from patient's home. Reviewed events since last session. Assessed patient's mood. Assisted patient in exploring and identifying triggers for depressed mood. Discussed patient's outreach to local resources and the outcome. Discussed additional local resources for home repairs and financial stressors, such as, [facility], medicaid MQB program, [facility], and [facility] program. Reviewed patient's gratitude journal. Assisted patient in identifying additional areas of gratitude. Provided psycho education related to gratitude exercises and depression. Clinician requested patient continue gratitude journal for homework and identify at minimum 2 areas of gratitude each day.    Collaboration of Care: not required at this time   Diagnosis:  Major Depressive Disorder, recurrent, severe without psychotic features   Generalized anxiety disorder   Plan: Patient is to utilize [employer] Therapy, thought re-framing, relaxation techniques, mindfulness, effective communication, and coping strategies to decrease symptoms associated with Major Depressive Disorder and Generalized Anxiety Disorder. Frequency: weekly  Modality: individual      Long-term goal:   Patient stated, "I want to see a change in the anxiety".  Reduce  overall level, frequency, and intensity of the symptoms of anxiety and depression as evidenced by decreased sadness, feelings of anxiety, feeling "lost", lack of energy, difficulty falling and staying asleep, changes in concentration, psychomotor retardation, rapid heart rate, excessive worry, restlessness, feeling jittery, and feeling on edge from 5 to 6 days per week to 0 to 1 days per week per patient reported for at least 3 consecutive months.   Target Date: 02/11/24  Progress: progressing    Short-term goal:  Develop coping strategies to utilize in response to symptoms of depression/anxiety and stressors Patient stated, "I think I'm half way to that way" Target Date: 08/12/23  Progress: progressing    Verbally express patient's thoughts and feelings to others and utilize effective communication strategies when expressing patient's thoughts/feelings    Target Date: 08/12/23  Progress: progressing    Verbalize an understanding of the relationship between symptoms of anxiety and the impact on patient's thought patterns and behaviors    Target Date: 08/12/23  Progress: progressing    Doree Barthel, LCSW

## 2023-03-01 ENCOUNTER — Inpatient Hospital Stay: Payer: Medicare Other

## 2023-03-01 DIAGNOSIS — D631 Anemia in chronic kidney disease: Secondary | ICD-10-CM | POA: Diagnosis not present

## 2023-03-01 DIAGNOSIS — N1832 Chronic kidney disease, stage 3b: Secondary | ICD-10-CM | POA: Diagnosis not present

## 2023-03-01 DIAGNOSIS — M109 Gout, unspecified: Secondary | ICD-10-CM | POA: Diagnosis not present

## 2023-03-01 DIAGNOSIS — N184 Chronic kidney disease, stage 4 (severe): Secondary | ICD-10-CM | POA: Diagnosis not present

## 2023-03-01 DIAGNOSIS — E119 Type 2 diabetes mellitus without complications: Secondary | ICD-10-CM | POA: Diagnosis not present

## 2023-03-01 LAB — HEMOGLOBIN AND HEMATOCRIT, BLOOD
HCT: 32 % — ABNORMAL LOW (ref 36.0–46.0)
Hemoglobin: 10.1 g/dL — ABNORMAL LOW (ref 12.0–15.0)

## 2023-03-01 NOTE — Progress Notes (Signed)
Hgb 10.1 today. No retacrit injection required

## 2023-03-04 ENCOUNTER — Ambulatory Visit (INDEPENDENT_AMBULATORY_CARE_PROVIDER_SITE_OTHER): Payer: Medicare Other | Admitting: Clinical

## 2023-03-04 DIAGNOSIS — F332 Major depressive disorder, recurrent severe without psychotic features: Secondary | ICD-10-CM | POA: Diagnosis not present

## 2023-03-04 DIAGNOSIS — F411 Generalized anxiety disorder: Secondary | ICD-10-CM

## 2023-03-04 NOTE — Progress Notes (Signed)
[facility] Counselor/Therapist Progress Note  Patient ID: Abigail Figueroa, MRN: [id number],    Date: 03/04/2023  Time Spent: 12:32pm - 1:21pm : 49 minutes   Treatment Type: Individual Therapy  Reported Symptoms: none reported  Mental Status Exam: Appearance:  Neat and Well Groomed     Behavior: Appropriate  Motor: Normal  Speech/Language:  Clear and Coherent  Affect: Appropriate  Mood: normal  Thought process: normal  Thought content:   WNL  Sensory/Perceptual disturbances:   WNL  Orientation: oriented to person, place, and situation  Attention: Good  Concentration: Good  Memory: WNL  Fund of knowledge:  Good  Insight:   Good  Judgment:  Good  Impulse Control: Good   Risk Assessment: Danger to Self:  No Patient denied current suicidal ideation  Self-injurious Behavior: No Danger to Others: No Patient denied current homicidal ideation Duty to Warn:no Physical Aggression / Violence:No  Access to Firearms a concern: No  Gang Involvement:No   Subjective: Patient stated, "been feeling ok" in response to patient's mood and physical health. Patient stated, "my mood's been pretty good", "I have been hanging in there" in regard to patient's physical health.  Patient reported she will be starting physical therapy next week. Patient reported she continues to experience difficulty with the tremors. Patient stated, "yesterday they (tremors) were pretty bad". Patient reported she is still considering participating in an online Parkinson's support group. Patient reported she left a message for the weatherization program. Patient stated, "it went pretty good" in response to patient's gratitude journal. During today's session patient shared the following gratitude entries: grateful for family, support, "me (patient) doing things around the house I enjoy", "me watching sports and [employer] with [name] (husband)", "me listening to my gospel music", "me having positive thoughts that were  negative at first", "just making myself feel good by looking into the mirror and saying to myself, I'm a good person, I'm kind, loving". Patient reported she has noticed positive changes in her mood when patient implements cognitive restructuring, gratitude journal, and positive self talk. Patient reported her daughter recently offered to play a board game with patient after daughter's son decided to discontinue playing video game with patient's daughter. Patient reported she declined daughter's offer and reported feeling hurt that daughter offered after grandson discontinued video game. Patient reported she felt daughter was offering because daughter's plans changed.   Interventions: Cognitive Behavioral Therapy. Clinician conducted session via caregility video from clinician's home office. Patient provided verbal consent to proceed with telehealth session and is aware of limitations of telephone or video visits. Patient participated in session from patient's home. Assessed patient's mood. Discussed current health related stressors and upcoming physical/occupation therapies. Provided psycho education related to support group resources. Discussed status of resources identified during previous session. Reviewed patient's gratitude journal. Provided psycho education related to positive self talk. Explored changes in patient's mood in response to cognitive restructuring, gratitude exercises, and positive self talk. Provided reflective listening and validated patient's implementation of cognitive restructuring/reframing, gratitude journal, and use of positive self talk. Assisted patient in challenging thoughts to help patient reframe recent interaction with daughter. Clinician requested patient continue gratitude journal for homework and identify at minimum 2 areas of gratitude each day.     Collaboration of Care: not required at this time   Diagnosis:  Major Depressive Disorder, recurrent, severe without  psychotic features   Generalized anxiety disorder   Plan: Patient is to utilize [employer] Therapy, thought re-framing, relaxation techniques, mindfulness, effective  communication, and coping strategies to decrease symptoms associated with Major Depressive Disorder and Generalized Anxiety Disorder. Frequency: weekly  Modality: individual      Long-term goal:   Patient stated, "I want to see a change in the anxiety".      Reduce overall level, frequency, and intensity of the symptoms of anxiety and depression as evidenced by decreased sadness, feelings of anxiety, feeling "lost", lack of energy, difficulty falling and staying asleep, changes in concentration, psychomotor retardation, rapid heart rate, excessive worry, restlessness, feeling jittery, and feeling on edge from 5 to 6 days per week to 0 to 1 days per week per patient reported for at least 3 consecutive months.   Target Date: 02/11/24  Progress: progressing    Short-term goal:  Develop coping strategies to utilize in response to symptoms of depression/anxiety and stressors Patient stated, "I think I'm half way to that way" Target Date: 08/12/23  Progress: progressing    Verbally express patient's thoughts and feelings to others and utilize effective communication strategies when expressing patient's thoughts/feelings    Target Date: 08/12/23  Progress: progressing    Verbalize an understanding of the relationship between symptoms of anxiety and the impact on patient's thought patterns and behaviors    Target Date: 08/12/23  Progress: progressing    Doree Barthel, LCSW

## 2023-03-04 NOTE — Progress Notes (Signed)
                Dezi Schaner, LCSW 

## 2023-03-06 ENCOUNTER — Other Ambulatory Visit: Payer: Self-pay | Admitting: Family Medicine

## 2023-03-06 DIAGNOSIS — R569 Unspecified convulsions: Secondary | ICD-10-CM

## 2023-03-07 DIAGNOSIS — M109 Gout, unspecified: Secondary | ICD-10-CM | POA: Diagnosis not present

## 2023-03-07 DIAGNOSIS — M25572 Pain in left ankle and joints of left foot: Secondary | ICD-10-CM | POA: Diagnosis not present

## 2023-03-08 DIAGNOSIS — G4733 Obstructive sleep apnea (adult) (pediatric): Secondary | ICD-10-CM | POA: Diagnosis not present

## 2023-03-11 ENCOUNTER — Ambulatory Visit (INDEPENDENT_AMBULATORY_CARE_PROVIDER_SITE_OTHER): Payer: Medicare Other | Admitting: Clinical

## 2023-03-11 DIAGNOSIS — F411 Generalized anxiety disorder: Secondary | ICD-10-CM | POA: Diagnosis not present

## 2023-03-11 DIAGNOSIS — F332 Major depressive disorder, recurrent severe without psychotic features: Secondary | ICD-10-CM | POA: Diagnosis not present

## 2023-03-11 NOTE — Progress Notes (Signed)
                Dezi Schaner, LCSW 

## 2023-03-11 NOTE — Progress Notes (Signed)
[facility] Counselor/Therapist Progress Note  Patient ID: Abigail Figueroa, MRN: [id number],    Date: 03/11/2023  Time Spent: 12:32pm - 1:15pm : 43 minutes   Treatment Type: Individual Therapy  Reported Symptoms: Patient reported feeling "down"  Mental Status Exam: Appearance:  Neat and Well Groomed     Behavior: Appropriate  Motor: Normal  Speech/Language:  Clear and Coherent  Affect: Appropriate  Mood: "Down" per patient  Thought process: normal  Thought content:   WNL  Sensory/Perceptual disturbances:   WNL  Orientation: oriented to person, place, and situation  Attention: Good  Concentration: Good  Memory: WNL  Fund of knowledge:  Good  Insight:   Good  Judgment:  Good  Impulse Control: Good   Risk Assessment: Danger to Self:  No Patient denied current suicidal ideation  Self-injurious Behavior: No Danger to Others: No Patient denied current homicidal ideation  Duty to Warn:no Physical Aggression / Violence:No  Access to Firearms a concern: No  Gang Involvement:No   Subjective: Patient stated, "its been going" in response to events since last session. Patient stated, "[name] been trying to deal with this foot". Patient reported she recently received an injection in her foot for treatment of gout. Patient reported she has been able to spend some time outside with her dog, Abigail Figueroa, since last session. Patient reported pain in her foot has limited patient's activities and she is currently wearing a boot. Patient stated, "[name] been down", "I'm fighting through but I've been down". Patient stated, "I thought I was going to get rid of the boot and be on my way". Patient stated, "not well at all", "I haven't been in to doing much of anything" in response to gratitude journal. Patient identified foot pain as a barrier to completing gratitude journal. Patient reported she is grateful for her daughter's support, daughter "talking me through it", and laughing with her  daughter. Patient reported patient's psychiatrist noted during today's session that patient vocalizes her thoughts and feelings more frequently than she has in past. Patient stated,  "after we talked about it and everything, I felt pretty good", "its letting me know I am growing and I'm using the tools that has been given to me to use" in response to psychiatrist's feedback.   Interventions: Cognitive Behavioral Therapy. Clinician conducted session via caregility video from clinician's home office. Patient provided verbal consent to proceed with telehealth session and is aware of limitations of telephone or video visits. Patient participated in session from patient's home. Reviewed events since last session. Discussed recent stressors and explored the impact on patient's mood. Assessed patient's mood since last session and assessed current mood. Reviewed patient's gratitude journal and explored barriers to patient completing gratitude journal. Assisted patient in exploring and identifying areas of gratitude since last session. Discussed patient's recent appointment with psychiatrist and patient's thoughts/feelings in response.  Provided psycho education related to challenging questions to help patient reframe negative thoughts. Provided psycho education related to gratitude exercise of writing a gratitude letter. Clinician requested patient continue gratitude journal for homework and identify at minimum 2 areas of gratitude each day.   Patient lost caregility connection which ended today's session. Clinician attempted to reach patient via phone to resume caregility session but was unable to reach patient. Clinician left a voicemail for patient.   Collaboration of Care: not required at this time   Diagnosis:  Major Depressive Disorder, recurrent, severe without psychotic features   Generalized anxiety disorder   Plan: Patient is to utilize  Cognitive Behavioral Therapy, thought re-framing, relaxation  techniques, mindfulness, effective communication, and coping strategies to decrease symptoms associated with Major Depressive Disorder and Generalized Anxiety Disorder. Frequency: weekly  Modality: individual      Long-term goal:   Patient stated, "I want to see a change in the anxiety".      Reduce overall level, frequency, and intensity of the symptoms of anxiety and depression as evidenced by decreased sadness, feelings of anxiety, feeling "lost", lack of energy, difficulty falling and staying asleep, changes in concentration, psychomotor retardation, rapid heart rate, excessive worry, restlessness, feeling jittery, and feeling on edge from 5 to 6 days per week to 0 to 1 days per week per patient reported for at least 3 consecutive months.   Target Date: 02/11/24  Progress: progressing    Short-term goal:  Develop coping strategies to utilize in response to symptoms of depression/anxiety and stressors Patient stated, "I think I'm half way to that way" Target Date: 08/12/23  Progress: progressing    Verbally express patient's thoughts and feelings to others and utilize effective communication strategies when expressing patient's thoughts/feelings    Target Date: 08/12/23  Progress: progressing    Verbalize an understanding of the relationship between symptoms of anxiety and the impact on patient's thought patterns and behaviors    Target Date: 08/12/23  Progress: progressing       Doree Barthel, LCSW

## 2023-03-17 DIAGNOSIS — R531 Weakness: Secondary | ICD-10-CM | POA: Diagnosis not present

## 2023-03-17 DIAGNOSIS — R269 Unspecified abnormalities of gait and mobility: Secondary | ICD-10-CM | POA: Diagnosis not present

## 2023-03-17 DIAGNOSIS — R2689 Other abnormalities of gait and mobility: Secondary | ICD-10-CM | POA: Diagnosis not present

## 2023-03-17 DIAGNOSIS — Z7409 Other reduced mobility: Secondary | ICD-10-CM | POA: Diagnosis not present

## 2023-03-17 DIAGNOSIS — Z789 Other specified health status: Secondary | ICD-10-CM | POA: Diagnosis not present

## 2023-03-17 DIAGNOSIS — G20A1 Parkinson's disease without dyskinesia, without mention of fluctuations: Secondary | ICD-10-CM | POA: Diagnosis not present

## 2023-03-17 DIAGNOSIS — G20C Parkinsonism, unspecified: Secondary | ICD-10-CM | POA: Diagnosis not present

## 2023-03-18 ENCOUNTER — Ambulatory Visit (INDEPENDENT_AMBULATORY_CARE_PROVIDER_SITE_OTHER): Payer: Medicare Other | Admitting: Clinical

## 2023-03-18 DIAGNOSIS — F332 Major depressive disorder, recurrent severe without psychotic features: Secondary | ICD-10-CM

## 2023-03-18 DIAGNOSIS — F411 Generalized anxiety disorder: Secondary | ICD-10-CM | POA: Diagnosis not present

## 2023-03-18 NOTE — Progress Notes (Signed)
                Dezi Schaner, LCSW 

## 2023-03-18 NOTE — Progress Notes (Signed)
[facility] Counselor/Therapist Progress Note  Patient ID: MARCEIL LINTZ, MRN: [id number],    Date: 03/18/2023  Time Spent: 12:36pm - 1:18pm : 42 minutes   Treatment Type: Individual Therapy  Reported Symptoms: Patient reported recent improvement in mood  Mental Status Exam: Appearance:  Neat and Well Groomed     Behavior: Appropriate  Motor: Normal  Speech/Language:  Clear and Coherent  Affect: Appropriate  Mood: normal  Thought process: normal  Thought content:   WNL  Sensory/Perceptual disturbances:   WNL  Orientation: oriented to person, place, and situation  Attention: Good  Concentration: Good  Memory: WNL  Fund of knowledge:  Good  Insight:   Good  Judgment:  Good  Impulse Control: Good   Risk Assessment: Danger to Self:  No Patient denied current suicidal ideation  Self-injurious Behavior: No Danger to Others: No Patient denied current homicidal ideation Duty to Warn:no Physical Aggression / Violence:No  Access to Firearms a concern: No  Gang Involvement:No   Subjective: Patient reported patient's phone battery died towards the end of patient's last therapy session. Patient stated, "things have been going pretty good" in response to events since last session. Patient stated, "I finally got to talk to someone with the weatherization". Patient reported she attended an appointment with occupational therapy and stated, "I was ready, it went good". Patient stated, "she did say they felt they (occupational therapy) could help me". Patient reported occupational therapy will be referring patient to a provider closer to patient's home. Patient stated, "it went well" in response to recent appointment. Patient stated ,"I've been ok" in response to patient's mood. Patient stated,  "to know there is some help out there for me", "finally we're ready to make that move", "I've been excited the whole week" in reference to recent occupational therapy appointment.  Patient stated, "I started writing my letter" in reference to gratitude letter and stated, "the letter is to Tish (daughter) and I am going to give it to her". Patient stated,  "it feels good", "thinking about all she has done to help me and been there for me", "it hasn't been hard to write this, its made me feel good to write this about my daughter". Patient reported she feels the gratitude letter has had a positive impact patient's mood.   Interventions: Cognitive Behavioral Therapy. Clinician conducted session via caregility video from clinician's home office. Patient provided verbal consent to proceed with telehealth session and is aware of limitations of telephone or video visits. Patient participated in session from patient's home. Discussed abrupt ending of last session. Reviewed events since last session. Discussed patient's recent appointment with occupational therapy and patient's thoughts/feelings in response. Assessed patient's mood. Discussed recent improvement in mood and explored/identifying contributing factors to recent improvement. Reviewed patient's homework. Explored the impact of gratitude letter on patient's mood. Clinician requested patient continue gratitude journal for homework and identify at minimum 2 areas of gratitude each day and continue gratitude letter.    Collaboration of Care: not required at this time   Diagnosis:  Major Depressive Disorder, recurrent, severe without psychotic features   Generalized anxiety disorder   Plan: Patient is to utilize [employer] Therapy, thought re-framing, relaxation techniques, mindfulness, effective communication, and coping strategies to decrease symptoms associated with Major Depressive Disorder and Generalized Anxiety Disorder. Frequency: weekly  Modality: individual      Long-term goal:   Patient stated, "I want to see a change in the anxiety".      Reduce overall  level, frequency, and intensity of the symptoms of  anxiety and depression as evidenced by decreased sadness, feelings of anxiety, feeling "lost", lack of energy, difficulty falling and staying asleep, changes in concentration, psychomotor retardation, rapid heart rate, excessive worry, restlessness, feeling jittery, and feeling on edge from 5 to 6 days per week to 0 to 1 days per week per patient reported for at least 3 consecutive months.   Target Date: 02/11/24  Progress: progressing    Short-term goal:  Develop coping strategies to utilize in response to symptoms of depression/anxiety and stressors Patient stated, "I think I'm half way to that way" Target Date: 08/12/23  Progress: progressing    Verbally express patient's thoughts and feelings to others and utilize effective communication strategies when expressing patient's thoughts/feelings    Target Date: 08/12/23  Progress: progressing    Verbalize an understanding of the relationship between symptoms of anxiety and the impact on patient's thought patterns and behaviors    Target Date: 08/12/23  Progress: progressing    Doree Barthel, LCSW

## 2023-03-23 ENCOUNTER — Other Ambulatory Visit: Payer: Self-pay | Admitting: Physician Assistant

## 2023-03-23 DIAGNOSIS — M25572 Pain in left ankle and joints of left foot: Secondary | ICD-10-CM | POA: Diagnosis not present

## 2023-03-23 DIAGNOSIS — F331 Major depressive disorder, recurrent, moderate: Secondary | ICD-10-CM

## 2023-03-24 NOTE — Telephone Encounter (Signed)
Requested Prescriptions  Pending Prescriptions Disp Refills   busPIRone (BUSPAR) 15 MG tablet [Pharmacy Med Name: BUSPIRONE HCL 15 MG TABLET] 180 tablet 1    Sig: TAKE 1 TABLET BY MOUTH TWICE A DAY     Psychiatry: Anxiolytics/Hypnotics - Non-controlled Passed - 03/23/2023  1:50 AM      Passed - Valid encounter within last 12 months    Recent Outpatient Visits           4 months ago Nausea   [facility] [name], PA-C   5 months ago Tremor   [facility] [name], PA-C   5 months ago Tremor   [facility] [name], PA-C   6 months ago Acute non-recurrent frontal sinusitis   [facility] [name], PA-C   7 months ago CKD (chronic kidney disease) stage 4, GFR 15-29 ml/min [facility])   [facility] [name], PA-C       Future Appointments             In 2 weeks Lennette Bihari, MD [facility]   In 9 months Dellis Anes, Hetty Ely, MD [facility] [place] at [facility]

## 2023-03-25 ENCOUNTER — Ambulatory Visit (INDEPENDENT_AMBULATORY_CARE_PROVIDER_SITE_OTHER): Payer: Medicare Other | Admitting: Clinical

## 2023-03-25 DIAGNOSIS — F411 Generalized anxiety disorder: Secondary | ICD-10-CM

## 2023-03-25 DIAGNOSIS — F332 Major depressive disorder, recurrent severe without psychotic features: Secondary | ICD-10-CM

## 2023-03-25 NOTE — Progress Notes (Signed)
[facility] Counselor/Therapist Progress Note  Patient ID: Abigail Figueroa, MRN: [id number],    Date: 03/25/2023  Time Spent: 12:32pm - 1:18pm : 46 minutes   Treatment Type: Individual Therapy  Reported Symptoms: none reported   Mental Status Exam: Appearance:  Neat and Well Groomed     Behavior: Appropriate  Motor: Normal  Speech/Language:  Clear and Coherent  Affect: Appropriate  Mood: normal  Thought process: normal  Thought content:   WNL  Sensory/Perceptual disturbances:   WNL  Orientation: oriented to person, place, and situation  Attention: Good  Concentration: Good  Memory: WNL  Fund of knowledge:  Good  Insight:   Good  Judgment:  Good  Impulse Control: Good   Risk Assessment: Danger to Self:  No Patient denied current suicidal ideation  Self-injurious Behavior: No Danger to Others: No Patient denied current homicidal ideation Duty to Warn:no Physical Aggression / Violence:No  Access to Firearms a concern: No  Gang Involvement:No   Subjective: Patient stated, "going pretty good" in response to events since last session. Patient reported she visited her son's home yesterday and enjoyed visiting her son. Patient reported during recent appointment with physician patient was given a boot to wear that fits inside patient's shoe. Patient reported improvement in foot pain and stated, "its not as bad as it was". Patient stated, "I've been more active and been able to get up and do what I want to do", "it hasn't held me back any" in response to improvement in foot pain. Patient reported she has resumed normal activities. Patient reported she has been able to spend time outside recently and was able to take her dog, [name], outside multiple times. Patient stated, "I think it has impacted somewhat for better" in response to the impact on patient's mood. Patient stated, "I feel like things are better as far as it goes". Patient stated, "its ok" in response to  current mood. Patient stated,  "my gratitude letter is coming along". Patient reported she plans to mail the gratitude letter to her daughter. Patient stated,  "I was grateful to get that thing (boot) off my foot" and reported she was grateful for recent time spent with her children, being able to spend time with her children during the holiday season, grateful patient chose to visit son yesterday, listening to Christmas music and watching a Christmas movie with her children.  Interventions: Cognitive Behavioral Therapy. Clinician conducted session via caregility video from clinician's home office. Patient provided verbal consent to proceed with telehealth session and is aware of limitations of telephone or video visits. Patient participated in session from patient's home. Reviewed events since last session. Discussed the status of recent stressors and discussed the impact of change in stressors on patient's mood. Assessed patient's mood. Reviewed patient's homework. Assisted patient in exploring and identifying areas of gratitude since last session. Discussed identifying three positives when experiencing negative thoughts. Provided psycho education related to cognitive restructuring. Clinician requested patient continue gratitude journal for homework and continue gratitude letter.    Collaboration of Care: not required at this time   Diagnosis:  Major Depressive Disorder, recurrent, severe without psychotic features   Generalized anxiety disorder   Plan: Patient is to utilize [employer] Therapy, thought re-framing, relaxation techniques, mindfulness, effective communication, and coping strategies to decrease symptoms associated with Major Depressive Disorder and Generalized Anxiety Disorder. Frequency: weekly  Modality: individual      Long-term goal:   Patient stated, "I want to see a change in  the anxiety".      Reduce overall level, frequency, and intensity of the symptoms of anxiety  and depression as evidenced by decreased sadness, feelings of anxiety, feeling "lost", lack of energy, difficulty falling and staying asleep, changes in concentration, psychomotor retardation, rapid heart rate, excessive worry, restlessness, feeling jittery, and feeling on edge from 5 to 6 days per week to 0 to 1 days per week per patient reported for at least 3 consecutive months.   Target Date: 02/11/24  Progress: progressing    Short-term goal:  Develop coping strategies to utilize in response to symptoms of depression/anxiety and stressors Patient stated, "I think I'm half way to that way" Target Date: 08/12/23  Progress: progressing    Verbally express patient's thoughts and feelings to others and utilize effective communication strategies when expressing patient's thoughts/feelings    Target Date: 08/12/23  Progress: progressing    Verbalize an understanding of the relationship between symptoms of anxiety and the impact on patient's thought patterns and behaviors    Target Date: 08/12/23  Progress: progressing     Doree Barthel, LCSW

## 2023-03-25 NOTE — Progress Notes (Signed)
Doree Barthel, LCSW

## 2023-03-29 ENCOUNTER — Inpatient Hospital Stay: Payer: Medicare Other | Attending: Oncology

## 2023-03-29 ENCOUNTER — Inpatient Hospital Stay: Payer: Medicare Other

## 2023-03-29 VITALS — BP 125/82 | HR 70

## 2023-03-29 DIAGNOSIS — D631 Anemia in chronic kidney disease: Secondary | ICD-10-CM

## 2023-03-29 DIAGNOSIS — N184 Chronic kidney disease, stage 4 (severe): Secondary | ICD-10-CM | POA: Insufficient documentation

## 2023-03-29 LAB — HEMOGLOBIN AND HEMATOCRIT, BLOOD
HCT: 29.9 % — ABNORMAL LOW (ref 36.0–46.0)
Hemoglobin: 9.7 g/dL — ABNORMAL LOW (ref 12.0–15.0)

## 2023-03-29 MED ORDER — EPOETIN ALFA-EPBX 20000 UNIT/ML IJ SOLN
20000.0000 [IU] | Freq: Once | INTRAMUSCULAR | Status: AC
Start: 2023-03-29 — End: 2023-03-29
  Administered 2023-03-29: 20000 [IU] via SUBCUTANEOUS
  Filled 2023-03-29: qty 1

## 2023-03-30 DIAGNOSIS — M791 Myalgia, unspecified site: Secondary | ICD-10-CM | POA: Diagnosis not present

## 2023-03-30 DIAGNOSIS — M542 Cervicalgia: Secondary | ICD-10-CM | POA: Diagnosis not present

## 2023-03-30 DIAGNOSIS — G43719 Chronic migraine without aura, intractable, without status migrainosus: Secondary | ICD-10-CM | POA: Diagnosis not present

## 2023-03-30 DIAGNOSIS — G518 Other disorders of facial nerve: Secondary | ICD-10-CM | POA: Diagnosis not present

## 2023-04-07 ENCOUNTER — Ambulatory Visit: Payer: Medicare Other | Attending: Cardiovascular Disease | Admitting: Cardiovascular Disease

## 2023-04-07 ENCOUNTER — Encounter: Payer: Self-pay | Admitting: Cardiovascular Disease

## 2023-04-07 VITALS — BP 124/88 | HR 72 | Ht 63.0 in | Wt 184.6 lb

## 2023-04-07 DIAGNOSIS — I10 Essential (primary) hypertension: Secondary | ICD-10-CM

## 2023-04-07 DIAGNOSIS — E785 Hyperlipidemia, unspecified: Secondary | ICD-10-CM

## 2023-04-07 DIAGNOSIS — D631 Anemia in chronic kidney disease: Secondary | ICD-10-CM | POA: Diagnosis not present

## 2023-04-07 DIAGNOSIS — R002 Palpitations: Secondary | ICD-10-CM

## 2023-04-07 DIAGNOSIS — L439 Lichen planus, unspecified: Secondary | ICD-10-CM | POA: Diagnosis not present

## 2023-04-07 DIAGNOSIS — I509 Heart failure, unspecified: Secondary | ICD-10-CM | POA: Diagnosis not present

## 2023-04-07 DIAGNOSIS — N184 Chronic kidney disease, stage 4 (severe): Secondary | ICD-10-CM

## 2023-04-07 DIAGNOSIS — G4733 Obstructive sleep apnea (adult) (pediatric): Secondary | ICD-10-CM | POA: Diagnosis not present

## 2023-04-07 DIAGNOSIS — L680 Hirsutism: Secondary | ICD-10-CM | POA: Diagnosis not present

## 2023-04-07 NOTE — Patient Instructions (Addendum)
Medication Instructions:  *If you need a refill on your cardiac medications before your next appointment, please call your pharmacy*   Lab Work: If you have labs (blood work) drawn today and your tests are completely normal, you will receive your results only by: MyChart Message (if you have MyChart) OR A paper copy in the mail If you have any lab test that is abnormal or we need to change your treatment, we will call you to review the results.   Testing/Procedures: Your physician has recommended that you have a sleep study. This test records several body functions during sleep, including: brain activity, eye movement, oxygen and carbon dioxide blood levels, heart rate and rhythm, breathing rate and rhythm, the flow of air through your mouth and nose, snoring, body muscle movements, and chest and belly movement.    Follow-Up: At [facility], you and your health needs are our priority.  As part of our continuing mission to provide you with exceptional heart care, we have created designated Provider Care Teams.  These Care Teams include your primary Cardiologist (physician) and Advanced Practice Providers (APPs -  Physician Assistants and Nurse Practitioners) who all work together to provide you with the care you need, when you need it.  We recommend signing up for the patient portal called "MyChart".  Sign up information is provided on this After Visit Summary.  MyChart is used to connect with patients for Virtual Visits (Telemedicine).  Patients are able to view lab/test results, encounter notes, upcoming appointments, etc.  Non-urgent messages can be sent to your provider as well.   To learn more about what you can do with MyChart, go to [email or web address].    Your next appointment:   4 month(s)  Provider:   [name], MD   in 6 months, you will see Micah Flesher, PA

## 2023-04-07 NOTE — Progress Notes (Signed)
Cardiology Office Note    Date:  04/07/2023   ID:  Abigail Figueroa, DOB 18-Dec-1960, MRN [id number]  PCP:  [name], MD  Cardiologist:  [name], MD   11 month follow-up evaluation.  History of Present Illness:  Abigail Figueroa is a 62 y.o. female who has a history of hypertension, palpitations, peripheral edema, hyperlipidemia, type 2 diabetes mellitus, depression, as well as migraines. In 2007, she underwent a sleep study which was interpreted by Dr. Park Breed. I do not have the diagnostic polysomnogram report but I do have the CPAP titration trial. She ultimately was titrated up to an 8 cm water pressure. She states she initially used CPAP therapy for several years but had not used in years.   When I saw her in October 2014  her sleep had been very poor for several years and she had with frequent awakenings and loud snoring.   At that time, Her Epworth sleepiness scale revealed severe excessive daytime sleepiness.   Epworth Sleepiness Scale: Situation   Chance of Dozing/Sleeping (0 = never , 1 = slight chance , 2 = moderate chance , 3 = high chance )   sitting and reading 3   watching TV 3   sitting inactive in a public place 3   being a passenger in a motor vehicle for an hour or more 3   lying down in the afternoon 3   sitting and talking to someone 3   sitting quietly after lunch (no alcohol) 3   while stopped for a few minutes in traffic as the driver 0   Total Score  21   She received a S9 CPAP unit but ultimately again stopped using treatment.In April 2017, she underwent preoperative clearance nuclear study prior to undergoing lumbar back surgery.  This study was interpreted as intermediate risk with a possible defect in the mid, distal anterior wall and apex and there was distal inferior hypokinesis.  She ultimately was referred for cardiac catheterization which I performed on 08/06/2015 and revealed normal coronary arteries.   She was admitted to [facility] in September 2017 with Bell's palsy.  She was treated with steroids.   I  saw her in January 2018.  At that time I discussed the importance of resuming CPAP therapy and since it is been over 11 years since her initial evaluation I recommend repeat assessment.  Sleep study was done on November 08, 2017 which again confirmed severe sleep apnea with an AHI of 30.5/h and RDI of 42.5/h.  Events were more severe during REM sleep with an AHI of 76.1/h.  She had significant oxygen desaturation to a nadir of 71%.  CPAP was initiated and was titrated up to 11 cm.  She received a new ResMed AirSense 10 AutoSet CPAP unit with set up date on December 09, 2017.  She had worn the CPAP initially but but then developed a dermatitis/rash on her face for which she underwent dermatologic evaluation by Dr. Inis Sizer was felt to have contact dermatitis.  The patient felt that the mask was potentially contributing to this.  As result, recently she is only rarely been using CPAP.  A new download was obtained in the office  from February 14, 2018 through March 15, 2008.  This confirmed poor compliance with usage at only 10% over the last 30 days with 3 hours and 58 minutes of average use.  AHI however was excellent at 0.8 on  11 cm water pressure.   When I saw her in March 20, 2018 I stressed the importance of meeting compliance standards with CPAP use.  I discussed the negative cardiovascular consequences of untreated sleep apnea with reference to her health.  I recommend changing her mask to a DreamWear with chinstrap to provide minimal contact to her face to reduce potential contact dermatitis. She was on atorvastatin for hyperlipidemia and had not had recent laboratory.  She also was on amlodipine 5 mg, metoprolol 75 mg twice a day for hypertension and is diabetic on metformin.    She was evaluated by me in telemedicine encounters in May 2020 and in October 2020.  During her October 2020 encounter, blood  pressure was elevated and I recommended titration of metoprolol from 75 mg twice a day to 100 mg twice a day.  On her most recent download, AHI was excellent at 1.6 and 11 cm of water pressure the use was only 4 hours and 40 minutes.  Her LDL cholesterol was 105 despite being on atorvastatin I recommended the addition of Zetia 10 mg.  I saw her on August 15, 2020.  At that time she was no longer using CPAP.   She believes she has not been on therapy since mid 2021.  A download from March 4 through October 02, 2019 had shown an AHI of 2.4 at 11 cm water pressure.  She states her machine began to make a loud noise and she stopped using it.  Presently she is not sleeping well.  She goes to bed between 10 PM and midnight, has nocturia at least 3 times per night and wakes up between 6 and 8 AM.  She has daytime sleepiness.  I discussed reinitiation of therapy and the possibility of needing a new machine since her machine was having issues and making noise.  She has been followed by Leretha Pol of nephrology in [facility] with creatinine in January 2024 at 2.10.  She is been diabetic since 1999.  She has stopped metformin and is now on Ozempic.   She was evaluated by Micah Flesher, PA in September 2023 and was having lower extremity edema.  Sodium restriction was recommended in addition to transient increase in furosemide.  She underwent a 2D echo Doppler study on March 29, 2022 which showed an EF of 60 to 65% with grade 1 diastolic dysfunction.  RV systolic pressure was 15.1 mm.  Valves were essentially normal.   I last saw her on May 25, 2022.  She has been using CPAP therapy and I obtained a download from December 24 through May 24, 2022.  Usage days was 73%, however usage days greater than 4 hours was only 60%.  She was averaging 5 hours and 6 minutes of CPAP on days used.  Her AHI was 2.9 with a pressure range set between 8 and 16 cm.  Her 95th percentile pressure is  11.3 with maximum average pressure 12.2.  She previously had [facility] was no longer in CPAP business.  She has [facility] and [employer].  She will be changing to [facility] [place].  Presently she goes to bed between 8:30 and 10 PM and wakes up between 4 and 5 AM.    Since I last saw her, she presented to the emergency room in March 2024 for chest pain and syncope which was unwitnessed.  Workup was essentially negative.  Subsequently, she had another episode of syncope  leading to placement of a 30-day monitor which did not reveal any significant pauses, atrial fibrillation or arrhythmia and was without heart block.  A nuclear stress test from July 21, 2022 was nonischemic.  Saw Micah Flesher, PA on October 19, 2022.  She was evaluated by Micah Flesher in the office on October 19, 2022.  She had another episode of lightheadedness and she also reported tremors were getting worse.  She was to be evaluated by neurology.  She also was found to have elevated PTH and she was started on calcitriol by nephrology.  Presently, she tells me she was recently diagnosed with Parkinson's disease and is followed by neurologist in [facility].  She has lost approximately 40 pounds.  She has not been using her CPAP therapy and it appears that her last use was for several days in September.  She has dentures and uses a mouthguard.  She continues to be on amlodipine 7.5 mg, metoprolol tartrate 100 mg twice a day and has a prescription for furosemide to take as needed for swelling.  She is on rosuvastatin 20 mg for hyperlipidemia and Zetia 10 mg.  She is on carbidopa levodopa 25/100 mg 3 times a day for her Parkinson's disease.  She is on Emgality injection for migraines.  She takes fluoxetine and BuSpar for anxiety/depression.  She presents for reevaluation.  Past Medical History:  Diagnosis Date   Abnormal stress test    a. 10/2009: Normal myocardial perfusion imaging; b. 08/2015 MV: medium defect of mod  severity in mid ant apical region w/ mild HK of the distal inf wall;  c. 08/2015 Cath: nl Cors, EF 60%.   Anemia    Angio-edema    Anxiety    Arthritis    Bell's palsy    Depression    Dermatitis 09/10/2014   Edema    Essential hypertension    Frequent urination    Frequent urination at night    Headache    migraines, gets botox injections   Heart palpitations 10/2011   Event monitor showing sinus tachycardia, PACs with couplets and triplets.   Hx of echocardiogram    a. 10/2009 Echo: showed normal left ventricular function, mild left ventricular hypertrophy, and no significant valve abnormalities.   Hypercholesterolemia    Migraine    Morbid obesity (HCC)    Neuromuscular disorder (HCC)    OSA (obstructive sleep apnea)    has been on continuous positive airway pressure   Seizure disorder (HCC)    Seizures (HCC)    Type II diabetes mellitus (HCC)    Urticaria     Past Surgical History:  Procedure Laterality Date   ABDOMINAL HYSTERECTOMY  1990   without BSO   BACK SURGERY  1900s 2001   x2 with "cage put in"   BILATERAL SALPINGOOPHORECTOMY  1990   CARDIAC CATHETERIZATION N/A 08/06/2015   Procedure: Left Heart Cath and Coronary Angiography;  Surgeon: Lennette Bihari, MD;  Location: [facility];  Service: Cardiovascular;  Laterality: N/A;   CARPAL TUNNEL RELEASE Bilateral    CHOLECYSTECTOMY  1980   COLONOSCOPY WITH PROPOFOL N/A 08/30/2017   Procedure: COLONOSCOPY WITH PROPOFOL;  Surgeon: Toney Reil, MD;  Location: [facility] ENDOSCOPY;  Service: Endoscopy;  Laterality: N/A;   DG THUMB LEFT HAND  01/24/2018   repair joint in left thumb   DORSAL COMPARTMENT RELEASE Left 10/25/2014   Procedure: LEFT FIRST  DORSAL COMPARTMENT RELEASE AND RADIAL TENOSYNOVECTOMY ;  Surgeon: Dominica Severin, MD;  Location:  [facility];  Service: Orthopedics;  Laterality: Left;   HAND SURGERY     KNEE SURGERY Right 2012   meniscus tear   KNEE SURGERY  2012   LAMINECTOMY  1995    TUBAL LIGATION  1982   WRIST SURGERY Right     Current Medications: Outpatient Medications Prior to Visit  Medication Sig Dispense Refill   amLODipine (NORVASC) 5 MG tablet Take 1.5 tablet (7.5mg ) by mouth daily. 180 tablet 1   ARIPiprazole (ABILIFY) 5 MG tablet Take 5 mg by mouth daily.     Azelastine-Fluticasone (DYMISTA) 137-50 MCG/ACT SUSP Place 2 sprays into both nostrils in the morning and at bedtime. 23 g 5   BOTOX 100 UNITS SOLR injection Inject into the muscle every 3 (three) months.      busPIRone (BUSPAR) 15 MG tablet TAKE 1 TABLET BY MOUTH TWICE A DAY 180 tablet 1   chlorproMAZINE (THORAZINE) 25 MG tablet Take 50 mg by mouth 2 (two) times daily as needed.     chlorzoxazone (PARAFON) 500 MG tablet Take by mouth 4 (four) times daily as needed for muscle spasms.     clotrimazole-betamethasone (LOTRISONE) cream Apply 1 Application topically 2 (two) times daily. 45 g 3   EMGALITY 120 MG/ML SOAJ Inject into the skin as needed.     EPINEPHrine 0.3 mg/0.3 mL IJ SOAJ injection Inject into the muscle.     ezetimibe (ZETIA) 10 MG tablet TAKE 1 TABLET BY MOUTH EVERY DAY 90 tablet 2   fluocinolone (SYNALAR) 0.01 % external solution Apply topically as needed.     FLUoxetine (PROZAC) 10 MG capsule Take 40 mg by mouth daily.     fluticasone (FLONASE) 50 MCG/ACT nasal spray Place 1-2 sprays into both nostrils as needed for allergies or rhinitis.     furosemide (LASIX) 40 MG tablet Take 1 tablet (40 mg total) by mouth daily. 90 tablet 3   lamoTRIgine (LAMICTAL) 100 MG tablet Take 100 mg by mouth 2 (two) times daily.     Lancets (ONETOUCH ULTRASOFT) lancets Test fasting each morning and 2 hours before supper. Retest if having hypoglycemic symptoms. (Patient taking differently: 1 each by Other route as needed for other. Test fasting each morning and 2 hours before supper. Retest if having hypoglycemic symptoms.) 100 each 12   metoprolol tartrate (LOPRESSOR) 100 MG tablet TAKE 1 TABLET BY MOUTH TWICE  A DAY 180 tablet 3   montelukast (SINGULAIR) 10 MG tablet Take 1 tablet (10 mg total) by mouth at bedtime. 90 tablet 1   ONETOUCH VERIO test strip TEST FASTING GLUCOSE DAILY AS DIRECTED 100 strip 3   promethazine (PHENERGAN) 25 MG tablet Take 25 mg by mouth every 4 (four) hours as needed.     rosuvastatin (CRESTOR) 20 MG tablet TAKE 1 TABLET BY MOUTH EVERY DAY 90 tablet 0   Semaglutide,0.25 or 0.5MG /DOS, (OZEMPIC, 0.25 OR 0.5 MG/DOSE,) 2 MG/3ML SOPN Inject 0.25 mg into the skin once a week. (Patient taking differently: Inject 0.5 mg into the skin once a week.) 3 mL 0   TEGRETOL-XR 400 MG 12 hr tablet TAKE 1 TABLET BY MOUTH TWICE A DAY 60 tablet 0   tretinoin (RETIN-A) 0.025 % cream Apply topically at bedtime.     triamcinolone ointment (KENALOG) 0.1 % Apply 1 Application topically 2 (two) times daily.     Azelastine HCl 0.15 % SOLN PLACE 2 SPRAYS INTO BOTH NOSTRILS 2 (TWO) TIMES DAILY (Patient taking differently: Place 2 sprays into both nostrils 2 (  two) times daily. Taking as needed) 30 mL 11   carbidopa-levodopa (SINEMET IR) 25-100 MG tablet Take 1 tablet by mouth Three (3) times a day. Start with 1/2 pill three times a day, after a week can increase to 1 pill three times a day.     diclofenac Sodium (VOLTAREN) 1 % GEL Apply topically as needed. (Patient not taking: Reported on 01/13/2023)     loratadine (CLARITIN) 10 MG tablet Take 1 tablet (10 mg total) by mouth in the morning and at bedtime. 60 tablet 11   predniSONE (DELTASONE) 10 MG tablet Take two tablets (20mg ) twice daily for three days, then one tablet (10mg ) twice daily for three days, then STOP. 18 tablet 0   No facility-administered medications prior to visit.     Allergies:   Morphine, Morphine and codeine, Oxycodone, Oxycodone hcl, Aimovig [erenumab-aooe], Augmentin [amoxicillin-pot clavulanate], and Morphine sulfate   Social History   Socioeconomic History   Marital status: Married    Spouse name: Not on file   Number of  children: 2   Years of education: Not on file   Highest education level: 12th grade  Occupational History   Occupation: disabled  Tobacco Use   Smoking status: Former    Current packs/day: 0.00    Types: Cigarettes    Quit date: 01/03/1983    Years since quitting: 40.2   Smokeless tobacco: Never   Tobacco comments:    quit in 1984  Vaping Use   Vaping status: Never Used  Substance and Sexual Activity   Alcohol use: No    Alcohol/week: 0.0 standard drinks of alcohol   Drug use: No   Sexual activity: Never  Other Topics Concern   Not on file  Social History Narrative   ** Merged History Encounter ** She is a married mother of 2, grandmother 3. She tries to get exercise but is not doing any routine program.She quit smoking over 25 years ago does not drink alcohol.   No caffiene since 03/2022.    Social Determinants of Health   Financial Resource Strain: Low Risk  (12/06/2022)   Overall Financial Resource Strain (CARDIA)    Difficulty of Paying Living Expenses: Not very hard  Recent Concern: Financial Resource Strain - Medium Risk (11/09/2022)   Overall Financial Resource Strain (CARDIA)    Difficulty of Paying Living Expenses: Somewhat hard  Food Insecurity: Food Insecurity Present (12/06/2022)   Hunger Vital Sign    Worried About Running Out of Food in the Last Year: Sometimes true    Ran Out of Food in the Last Year: Sometimes true  Transportation Needs: No Transportation Needs (12/06/2022)   PRAPARE - Administrator, Civil Service (Medical): No    Lack of Transportation (Non-Medical): No  Physical Activity: Sufficiently Active (12/06/2022)   Exercise Vital Sign    Days of Exercise per Week: 5 days    Minutes of Exercise per Session: 40 min  Stress: Stress Concern Present (12/06/2022)   [employer] of Occupational Health - Occupational Stress Questionnaire    Feeling of Stress : To some extent  Social Connections: Socially Integrated (12/06/2022)   Social Connection  and Isolation Panel [NHANES]    Frequency of Communication with Friends and Family: More than three times a week    Frequency of Social Gatherings with Friends and Family: Twice a week    Attends Religious Services: More than 4 times per year    Active Member of [employer] or Organizations: Yes  Attends Engineer, structural: More than 4 times per year    Marital Status: Married     Family History:  The patient's family history includes Alzheimer's disease in her maternal grandmother; Dementia in her maternal grandmother; Diabetes in her maternal grandfather; Heart attack in her mother; Hypertension in her maternal grandfather; Sarcoidosis in her mother; Seizures in her mother.   ROS General: Negative; No fevers, chills, or night sweats; weight loss of greater than 40 pounds HEENT: Negative; No changes in vision or hearing, sinus congestion, difficulty swallowing Pulmonary: Negative; No cough, wheezing, shortness of breath, hemoptysis Cardiovascular: Negative; No chest pain, presyncope, syncope, palpitations GI: Negative; No nausea, vomiting, diarrhea, or abdominal pain GU: Negative; No dysuria, hematuria, or difficulty voiding Musculoskeletal: Negative; no myalgias, joint pain, or weakness Hematologic/Oncology: Negative; no easy bruising, bleeding Endocrine: Negative; no heat/cold intolerance; no diabetes Neuro: Recently diagnosed with Parkinson's disease Skin: Negative; No rashes or skin lesions Psychiatric: Negative; No behavioral problems, depression Sleep: Positive for OSA, now back on CPAP therapy no bruxism, restless legs, hypnogognic hallucinations, no cataplexy Other comprehensive 14 point system review is negative.   PHYSICAL EXAM:   VS:  BP 124/88   Pulse 72   Ht 5\' 3"  (1.6 m)   Wt 184 lb 9.6 oz (83.7 kg)   LMP  (LMP Unknown)   SpO2 100%   BMI 32.70 kg/m     Repeat blood pressure by me 124/76  Wt Readings from Last 3 Encounters:  04/07/23 184 lb 9.6 oz (83.7  kg)  01/04/23 185 lb 8 oz (84.1 kg)  12/06/22 172 lb (78 kg)   General: Alert, oriented, no distress.  Skin: normal turgor, no rashes, warm and dry HEENT: Normocephalic, atraumatic. Pupils equal round and reactive to light; sclera anicteric; extraocular muscles intact;  Nose without nasal septal hypertrophy Mouth/Parynx benign; Mallinpatti scale 3 Neck: No JVD, no carotid bruits; normal carotid upstroke Lungs: clear to ausculatation and percussion; no wheezing or rales Chest wall: without tenderness to palpitation Heart: PMI not displaced, RRR, s1 s2 normal, 1/6 systolic murmur, no diastolic murmur, no rubs, gallops, thrills, or heaves Abdomen: soft, nontender; no hepatosplenomehaly, BS+; abdominal aorta nontender and not dilated by palpation. Back: no CVA tenderness Pulses 2+ Musculoskeletal: full range of motion, normal strength, no joint deformities Extremities: no clubbing cyanosis or edema, Homan's sign negative  Neurologic: grossly nonfocal; Cranial nerves grossly wnl Psychologic: History of depression     Studies/Labs Reviewed:    EKG Interpretation Date/Time:  Thursday April 07 2023 09:40:01 EST Ventricular Rate:  72 PR Interval:  170 QRS Duration:  96 QT Interval:  388 QTC Calculation: 424 R Axis:   -2  Text Interpretation: Normal sinus rhythm Minimal voltage criteria for LVH, may be normal variant ( R in aVL ) Nonspecific T wave abnormality When compared with ECG of 13-Jul-2022 03:57, PREVIOUS ECG IS PRESENT Confirmed by [name] ([id number]) on 04/07/2023 10:35:33 AM   May 25, 2022 ECG (independently read by me): Sinus bradycardia at 59, no ectopy, PRWP V1-4  August 15, 2020  ECG (independently read by me): NSR at 60; nonspecific T wave  Recent Labs:    Latest Ref Rng & Units 07/13/2022   12:29 AM 06/15/2022    2:38 PM 04/12/2022   12:18 PM  BMP  Glucose 70 - 99 mg/dL 604  540  981   BUN 8 - 23 mg/dL 40  27  24   Creatinine 0.44 - 1.00 mg/dL 1.91  4.78  2.37   BUN/Creat Ratio 12 - 28  11  10    Sodium 135 - 145 mmol/L 139  143  140   Potassium 3.5 - 5.1 mmol/L 3.5  4.4  4.2   Chloride 98 - 111 mmol/L 104  105  104   CO2 22 - 32 mmol/L 26  22  22    Calcium 8.9 - 10.3 mg/dL 9.0  21.3  08.6         Latest Ref Rng & Units 07/13/2022   12:29 AM 06/15/2022    2:38 PM 04/12/2022   12:18 PM  Hepatic Function  Total Protein 6.5 - 8.1 g/dL 7.3  7.5  7.8   Albumin 3.5 - 5.0 g/dL 3.8  4.5    AST 15 - 41 U/L 218  29    ALT 0 - 44 U/L 80  34    Alk Phosphatase 38 - 126 U/L 86  92    Total Bilirubin 0.3 - 1.2 mg/dL 0.4  <5.7         Latest Ref Rng & Units 03/29/2023   11:40 AM 03/01/2023   11:29 AM 02/01/2023   11:37 AM  CBC  Hemoglobin 12.0 - 15.0 g/dL 9.7  84.6  9.6   Hematocrit 36.0 - 46.0 % 29.9  32.0  29.8    Lab Results  Component Value Date   MCV 89.5 12/31/2022   MCV 87.3 09/21/2022   MCV 88.9 07/19/2022   Lab Results  Component Value Date   TSH 2.070 06/15/2022   Lab Results  Component Value Date   HGBA1C 6.4 (H) 04/07/2022     BNP    Component Value Date/Time   BNP 8.6 03/29/2022 1243    ProBNP No results found for: "PROBNP"   Lipid Panel     Component Value Date/Time   CHOL 150 10/19/2022 1500   CHOL 277 (H) 08/16/2014 0843   TRIG 82 10/19/2022 1500   TRIG 167 (H) 08/16/2014 0843   HDL 79 10/19/2022 1500   HDL 77 08/16/2014 0843   CHOLHDL 1.9 10/19/2022 1500   CHOLHDL 2.8 02/27/2019 0818   VLDL 22 02/27/2019 0818   VLDL 33 08/16/2014 0843   LDLCALC 56 10/19/2022 1500   LDLCALC 190 (H) 02/28/2017 1009   LDLCALC 167 (H) 08/16/2014 0843   LABVLDL 15 10/19/2022 1500     RADIOLOGY: No results found.   Additional studies/ records that were reviewed today include:  I have obtained a download from July 05, 2019 through October 02, 2019 when she was still using CPAP.  At 11 cm water pressure AHI was 2.4.  ASSESSMENT:    1. Hyperlipidemia with target LDL less than 70   2. Essential hypertension    3. Palpitations   4. Congestive heart failure, unspecified HF chronicity, unspecified heart failure type (HCC)     PLAN:  1.  Essential hypertension: Blood pressure today is stable at 124/76 when checked by me.  She continues to be on amlodipine 7.5 mg daily, metoprolol tartrate 100 mg twice a day, and has only been taking furosemide on a as needed basis for swelling she is no longer on nitrate therapy.  2.  OSA: Initially diagnosed in 2007 and was initially followed by Dr. Park Breed.  When seen by me last in 2022 she had stopped CPAP therapy toward the end of 2021.  Subsequently, she has been back on therapy.  Her current machine is a ResMed AirSense 10 AutoSet unit with set up date December 09, 2017.  [facility] has been her DME but since they no longer in CPAP business and apparently she now uses [facility].  I obtain downloads and it appears that she has essentially stopped using CPAP therapy since January 09, 2023.  Her unit was set at a range of 8 to 16 cm and AHI had been 1.9.  I have suggested a reevaluation of her sleep apnea particularly with her weight loss of 40 pounds and we will schedule her for an in-lab split-night sleep study.  If she receives a new machine, I will need to see her within 90 days of CPAP reinitiation.  3.  Hyperlipidemia: Remotely, she had history of significant hyperlipidemia with total cholesterol in October 2021 increasing to 310 and LDL at 198.  Most recently, she continues to be on Zetia 10 mg in addition to rosuvastatin 20 mg.  Laboratory from October 19, 2022 showed total cholesterol 150, HDL 79, LDL 56 and triglycerides 82.  4. Type 2 diabetes mellitus: She continues to be on Ozempic injection weekly.  Hemoglobin A1c in December 2023 was 6.4 and on February 22, 2023 had improved to 5.7.  5.  Stage IV CKD: Creatinine in December 2023 was 2.48 with estimated GFR 22.  Creatinine on October 05, 2022 was 2.35.  Most recent creatinine on February 22, 2023 had improved to 1.67 consistent with stage IIIb with GFR at 34.  She is now seeing Dr. Gerald Stabs in [place]  6.  Parkinson's disease: She is followed by neurologist in [facility] and is now on carbidopa levodopa 25/100 mg tablet 3 times daily.   I will see her in 4 months for reevaluation.  Medication Adjustments/Labs and Tests Ordered: Current medicines are reviewed at length with the patient today.  Concerns regarding medicines are outlined above.  Medication changes, Labs and Tests ordered today are listed in the Patient Instructions below. There are no Patient Instructions on file for this visit.   Signed, [name], MD  04/07/2023 10:35 AM    [facility] [address], [address], [place], [place]  [address] Phone: [phone]

## 2023-04-08 ENCOUNTER — Ambulatory Visit: Payer: Medicare Other | Admitting: Clinical

## 2023-04-08 DIAGNOSIS — F332 Major depressive disorder, recurrent severe without psychotic features: Secondary | ICD-10-CM | POA: Diagnosis not present

## 2023-04-08 DIAGNOSIS — F411 Generalized anxiety disorder: Secondary | ICD-10-CM | POA: Diagnosis not present

## 2023-04-08 NOTE — Progress Notes (Signed)
                Dezi Schaner, LCSW 

## 2023-04-08 NOTE — Progress Notes (Signed)
[facility] Counselor/Therapist Progress Note  Patient ID: MARAIYA BERNATH, MRN: [id number],    Date: 04/08/2023  Time Spent: 12:35pm - 1:19pm : 44 minutes   Treatment Type: Individual Therapy  Reported Symptoms: none reported   Mental Status Exam: Appearance:  Neat and Well Groomed     Behavior: Appropriate  Motor: Normal  Speech/Language:  Clear and Coherent  Affect: Appropriate  Mood: normal  Thought process: normal  Thought content:   WNL  Sensory/Perceptual disturbances:   WNL  Orientation: oriented to person, place, and situation  Attention: Good  Concentration: Good  Memory: WNL  Fund of knowledge:  Good  Insight:   Good  Judgment:  Good  Impulse Control: Good   Risk Assessment: Danger to Self:  No Patient denied current suicidal ideation  Self-injurious Behavior: No Danger to Others: No Patient denied current homicidal ideation Duty to Warn:no Physical Aggression / Violence:No  Access to Firearms a concern: No  Gang Involvement:No   Subjective: Patient stated, "its been going ok" in response to events since last session. Patient stated, "been working pretty hard at some things" in reference to recent doctors appointments. Patient reported recent appointments with patient's cardiologist and dermatologist. Patient stated, "my mood's been pretty good" in response to patient's mood since last session and stated, "its pretty good today" in response to current mood. Patient stated, "things have been going pretty good", "I've had a lot more positives than negatives". Patient reported she was able to obtain the name of the contact for the weatherization program and obtained more information regarding the weatherization program. Patient reported her granddaughter was dedicated on Sunday and patient stated, "it was nice". Patient stated, "we had a beautiful thanksgiving" and reported patient's husband and brother were able to set aside their differences to  celebrate the holiday. Patient reported her psychiatrist is increasing patient's medication, Abilify, to 10 mg. Patient stated, "It feels good to have some happiness". Patient stated, "to know we were able about to put things aside, the negative spirit, and come together and laugh and talk and have a good time" in reference to thanksgiving celebration.   Interventions: Cognitive Behavioral Therapy. Clinician conducted session via caregility video from clinician's home office. Patient provided verbal consent to proceed with telehealth session and is aware of limitations of telephone or video visits. Patient participated in session from patient's home. Reviewed events since last session. Assessed patient's mood since last session and assessed current mood. Reviewed areas of gratitude/positives patient was able to identify since last session. Discussed patient's appointment with psychiatrist and change in medication. Provided reflective listening, validation, and praised patient for identifying areas of gratitude/positives since last session. Clinician requested patient continue gratitude journal for homework and continue gratitude letter.    Collaboration of Care: not required at this time   Diagnosis:  Major Depressive Disorder, recurrent, severe without psychotic features   Generalized anxiety disorder   Plan: Patient is to utilize [employer] Therapy, thought re-framing, relaxation techniques, mindfulness, effective communication, and coping strategies to decrease symptoms associated with Major Depressive Disorder and Generalized Anxiety Disorder. Frequency: weekly  Modality: individual      Long-term goal:   Patient stated, "I want to see a change in the anxiety".      Reduce overall level, frequency, and intensity of the symptoms of anxiety and depression as evidenced by decreased sadness, feelings of anxiety, feeling "lost", lack of energy, difficulty falling and staying asleep,  changes in concentration, psychomotor retardation, rapid heart rate,  excessive worry, restlessness, feeling jittery, and feeling on edge from 5 to 6 days per week to 0 to 1 days per week per patient reported for at least 3 consecutive months.   Target Date: 02/11/24  Progress: progressing    Short-term goal:  Develop coping strategies to utilize in response to symptoms of depression/anxiety and stressors Patient stated, "I think I'm half way to that way" Target Date: 08/12/23  Progress: progressing    Verbally express patient's thoughts and feelings to others and utilize effective communication strategies when expressing patient's thoughts/feelings    Target Date: 08/12/23  Progress: progressing    Verbalize an understanding of the relationship between symptoms of anxiety and the impact on patient's thought patterns and behaviors    Target Date: 08/12/23  Progress: progressing    Doree Barthel, LCSW

## 2023-04-09 ENCOUNTER — Encounter: Payer: Self-pay | Admitting: Cardiovascular Disease

## 2023-04-15 ENCOUNTER — Ambulatory Visit: Payer: Medicare Other | Admitting: Clinical

## 2023-04-15 DIAGNOSIS — F411 Generalized anxiety disorder: Secondary | ICD-10-CM | POA: Diagnosis not present

## 2023-04-15 DIAGNOSIS — F332 Major depressive disorder, recurrent severe without psychotic features: Secondary | ICD-10-CM

## 2023-04-15 NOTE — Progress Notes (Signed)
                Dezi Schaner, LCSW 

## 2023-04-15 NOTE — Progress Notes (Addendum)
[facility] Counselor/Therapist Progress Note  Patient ID: DEONNE KILLELEA, MRN: [id number],    Date: 04/15/2023  Time Spent: 12:36pm - 1:17pm : 41 minutes   Treatment Type: Individual Therapy  Reported Symptoms: Patient reported worry  Mental Status Exam: Appearance:  Neat and Well Groomed     Behavior: Appropriate  Motor: Normal  Speech/Language:  Clear and Coherent  Affect: Appropriate  Mood: normal  Thought process: normal  Thought content:   WNL  Sensory/Perceptual disturbances:   WNL  Orientation: oriented to person, place, and situation  Attention: Good  Concentration: Good  Memory: WNL  Fund of knowledge:  Good  Insight:   Good  Judgment:  Good  Impulse Control: Good   Risk Assessment: Danger to Self:  No Patient denied current suicidal ideation  Self-injurious Behavior: No Danger to Others: No Patient denied current homicidal ideation Duty to Warn:no Physical Aggression / Violence:No  Access to Firearms a concern: No  Gang Involvement:No   Subjective: Patient stated, "been going ok, pretty good" in response to events since last session. Patient stated, "it's been pretty good" in response to patient's health. Patient reported she visited her son yesterday and stated, "we had a good time". Patient reported her son was upset that his daughter visited patient while daughter was ill and was worried about his daughter and patient. Patient reported her son took his daughter for a medical evaluation and daughter was diagnosed with strep throat.   Patient stated, "its been ok, I've been hanging in there, its been a little rough" in response to patient's mood since last session. Patient reported her daughter was not allowed to board her flight due to an issue with daughter's ticket and daughter now plans to leave for her trip on Sunday. Patient reported she was not able to get in touch with her daughter/grandson while daughter/grandson were traveling back from  the airport and patient stated, "I lost it".  Patient reported she was worried  something had happened to her daughter/grandson. Patient reported her daughter was feeling "down" due to change in flights and reported her daughter's mood impacted patient's mood. Patient stated, "you'd think I was the one going, I was so upset". Patient reported she was worried about her grandson driving on the highway and stated, "it really stressed me out going up 40, it really worried me". Patient stated, "I started thinking has something happened, I think that's what really got to me".    Interventions: Cognitive Behavioral Therapy and supportive therapy . Clinician conducted session via caregility video from clinician's home office. Patient provided verbal consent to proceed with telehealth session and is aware of limitations of telephone or video visits. Patient participated in session from patient's home. Reviewed events since last session and discussed status of recent stressors. Provided supportive therapy, active listening, and validation as patient discussed recent concerns related to patient's granddaughter's health. Assessed patient's mood since last session. Assisted patient in exploring and identifying triggers for recent decline in mood.  Assisted patient in exploring and identifying thoughts/feelings triggered by recent events.  Provided psycho education related to cognitive distortions, such as, catastrophizing and provided psycho education related to challenging cognitive distortions. Clinician requested patient continue gratitude journal for homework and continue gratitude letter.    Collaboration of Care: not required at this time   Diagnosis:  Major Depressive Disorder, recurrent, severe without psychotic features   Generalized anxiety disorder   Plan: Patient is to utilize [employer] Therapy, thought re-framing, relaxation techniques, mindfulness,  effective communication, and coping  strategies to decrease symptoms associated with Major Depressive Disorder and Generalized Anxiety Disorder. Frequency: weekly  Modality: individual      Long-term goal:   Patient stated, "I want to see a change in the anxiety".      Reduce overall level, frequency, and intensity of the symptoms of anxiety and depression as evidenced by decreased sadness, feelings of anxiety, feeling "lost", lack of energy, difficulty falling and staying asleep, changes in concentration, psychomotor retardation, rapid heart rate, excessive worry, restlessness, feeling jittery, and feeling on edge from 5 to 6 days per week to 0 to 1 days per week per patient reported for at least 3 consecutive months.   Target Date: 02/11/24  Progress: progressing    Short-term goal:  Develop coping strategies to utilize in response to symptoms of depression/anxiety and stressors Patient stated, "I think I'm half way to that way" Target Date: 08/12/23  Progress: progressing    Verbally express patient's thoughts and feelings to others and utilize effective communication strategies when expressing patient's thoughts/feelings    Target Date: 08/12/23  Progress: progressing    Verbalize an understanding of the relationship between symptoms of anxiety and the impact on patient's thought patterns and behaviors    Target Date: 08/12/23  Progress: progressing    Doree Barthel, LCSW

## 2023-04-20 DIAGNOSIS — R413 Other amnesia: Secondary | ICD-10-CM | POA: Diagnosis not present

## 2023-04-20 DIAGNOSIS — G20A1 Parkinson's disease without dyskinesia, without mention of fluctuations: Secondary | ICD-10-CM | POA: Diagnosis not present

## 2023-04-29 ENCOUNTER — Ambulatory Visit (INDEPENDENT_AMBULATORY_CARE_PROVIDER_SITE_OTHER): Payer: Medicare Other | Admitting: Clinical

## 2023-04-29 DIAGNOSIS — F332 Major depressive disorder, recurrent severe without psychotic features: Secondary | ICD-10-CM | POA: Diagnosis not present

## 2023-04-29 DIAGNOSIS — F411 Generalized anxiety disorder: Secondary | ICD-10-CM

## 2023-04-29 NOTE — Progress Notes (Signed)
[facility] Counselor/Therapist Progress Note  Patient ID: Abigail Figueroa, MRN: [id number],    Date: 04/29/2023  Time Spent: 12:37pm - 1:20pm : 43 minutes   Treatment Type: Individual Therapy  Reported Symptoms: none reported  Mental Status Exam: Appearance:  Neat and Well Groomed     Behavior: Appropriate  Motor: Normal  Speech/Language:  Clear and Coherent  Affect: Appropriate  Mood: normal  Thought process: normal  Thought content:   WNL  Sensory/Perceptual disturbances:   WNL  Orientation: oriented to person, place, and situation  Attention: Good  Concentration: Good  Memory: WNL  Fund of knowledge:  Good  Insight:   Good  Judgment:  Good  Impulse Control: Good   Risk Assessment: Danger to Self:  No Patient denied current suicidal ideation  Self-injurious Behavior: No Danger to Others: No Patient denied current homicidal ideation Duty to Warn:no Physical Aggression / Violence:No  Access to Firearms a concern: No  Gang Involvement:No   Subjective: Patient stated, "going pretty good" in response to events since last session.  Patient reported her daughter arrived home safely from daughter's trip. Patient reported her daughter's airline seat was given to someone else and patient/daughter were uncertain daughter would be able to return home on the initial flight. Patient reported her daughter was later able to obtain daughter's original seat on the plane and returned home. Patient stated, "that was one of those moments that I guess you could call it challenging". Patient stated,  "I feel good" in response to daughter's return. Patient stated, "everything else has been going pretty good" in reference to events since last session. Patient reported the neurologist expressed concerned regarding patient's balance during patient's recent visit. Patient reported she will be attending a virtual support group through [facility] for individuals with a diagnosis of Parkinson's  Disease. Patient stated, "I've been feeling pretty good" in response to patient's mood since last session. Patient stated,  "I put a hold on that while she was gone" and reported she plans to resume gratitude letter this weekend. Patient stated, "I had that negative thought about Tish (daughter) not contacting me" and stated, "I felt like I over thought that". Patient reported catastrophizing thoughts in response to daughter's flight home and the possibility of daughter traveling to uncle's home in [place] if daughter had to remain in [place]. Patient reported patient's initial thought was "we wouldn't see Tish (daughter) again". Patient reported no evidence to support her thought.  Patient reported she felt angry when daughter did not immediately respond during daughter's trip.   Interventions: Cognitive Behavioral Therapy. Clinician conducted session via caregility video from clinician's home office. Patient provided verbal consent to proceed with telehealth session and is aware of limitations of telephone or video visits. Patient participated in session from patient's home. Reviewed events since last session. Discussed daughter's recent trip home and patient's thoughts/feelings in response. Discussed patient's recent appointment with patient's neurologist and the outcome. Assessed patient's mood. Reviewed patient's homework. Assisted patient in exploring and identifying thoughts/feelings triggered by daughter's recent trip home and communication with daughter during trip. Assisted patient in practicing challenging cognitive distortions. Provided psycho education related to challenging cognitive distortions. Clinician requested patient continue gratitude journal for homework, continue gratitude letter, and practice challenging cognitive distortions.    Collaboration of Care: not required at this time   Diagnosis:  Major Depressive Disorder, recurrent, severe without psychotic features   Generalized  anxiety disorder   Plan: Patient is to utilize [employer] Therapy,  thought re-framing, relaxation techniques, mindfulness, effective communication, and coping strategies to decrease symptoms associated with Major Depressive Disorder and Generalized Anxiety Disorder. Frequency: weekly  Modality: individual      Long-term goal:   Patient stated, "I want to see a change in the anxiety".      Reduce overall level, frequency, and intensity of the symptoms of anxiety and depression as evidenced by decreased sadness, feelings of anxiety, feeling "lost", lack of energy, difficulty falling and staying asleep, changes in concentration, psychomotor retardation, rapid heart rate, excessive worry, restlessness, feeling jittery, and feeling on edge from 5 to 6 days per week to 0 to 1 days per week per patient reported for at least 3 consecutive months.   Target Date: 02/11/24  Progress: progressing    Short-term goal:  Develop coping strategies to utilize in response to symptoms of depression/anxiety and stressors Patient stated, "I think I'm half way to that way" Target Date: 08/12/23  Progress: progressing    Verbally express patient's thoughts and feelings to others and utilize effective communication strategies when expressing patient's thoughts/feelings    Target Date: 08/12/23  Progress: progressing    Verbalize an understanding of the relationship between symptoms of anxiety and the impact on patient's thought patterns and behaviors    Target Date: 08/12/23  Progress: progressing       Doree Barthel, LCSW

## 2023-04-29 NOTE — Progress Notes (Signed)
                Dezi Schaner, LCSW 

## 2023-05-03 ENCOUNTER — Other Ambulatory Visit: Payer: Self-pay | Admitting: Family Medicine

## 2023-05-03 ENCOUNTER — Inpatient Hospital Stay: Payer: Medicare Other | Attending: Oncology

## 2023-05-03 DIAGNOSIS — N184 Chronic kidney disease, stage 4 (severe): Secondary | ICD-10-CM | POA: Insufficient documentation

## 2023-05-03 DIAGNOSIS — D631 Anemia in chronic kidney disease: Secondary | ICD-10-CM | POA: Diagnosis not present

## 2023-05-03 LAB — CBC WITH DIFFERENTIAL (CANCER CENTER ONLY)
Abs Immature Granulocytes: 0.02 10*3/uL (ref 0.00–0.07)
Basophils Absolute: 0.1 10*3/uL (ref 0.0–0.1)
Basophils Relative: 2 %
Eosinophils Absolute: 0.4 10*3/uL (ref 0.0–0.5)
Eosinophils Relative: 7 %
HCT: 32 % — ABNORMAL LOW (ref 36.0–46.0)
Hemoglobin: 10 g/dL — ABNORMAL LOW (ref 12.0–15.0)
Immature Granulocytes: 0 %
Lymphocytes Relative: 36 %
Lymphs Abs: 2 10*3/uL (ref 0.7–4.0)
MCH: 28.6 pg (ref 26.0–34.0)
MCHC: 31.3 g/dL (ref 30.0–36.0)
MCV: 91.4 fL (ref 80.0–100.0)
Monocytes Absolute: 0.3 10*3/uL (ref 0.1–1.0)
Monocytes Relative: 6 %
Neutro Abs: 2.8 10*3/uL (ref 1.7–7.7)
Neutrophils Relative %: 49 %
Platelet Count: 197 10*3/uL (ref 150–400)
RBC: 3.5 MIL/uL — ABNORMAL LOW (ref 3.87–5.11)
RDW: 14.8 % (ref 11.5–15.5)
WBC Count: 5.6 10*3/uL (ref 4.0–10.5)
nRBC: 0 % (ref 0.0–0.2)

## 2023-05-03 LAB — IRON AND TIBC
Iron: 99 ug/dL (ref 28–170)
Saturation Ratios: 33 % — ABNORMAL HIGH (ref 10.4–31.8)
TIBC: 304 ug/dL (ref 250–450)
UIBC: 205 ug/dL

## 2023-05-03 LAB — RETIC PANEL
Immature Retic Fract: 5.4 % (ref 2.3–15.9)
RBC.: 3.44 MIL/uL — ABNORMAL LOW (ref 3.87–5.11)
Retic Count, Absolute: 31 10*3/uL (ref 19.0–186.0)
Retic Ct Pct: 0.9 % (ref 0.4–3.1)
Reticulocyte Hemoglobin: 32.6 pg (ref >27.9)

## 2023-05-03 LAB — FERRITIN: Ferritin: 267 ng/mL (ref 11–307)

## 2023-05-05 NOTE — Assessment & Plan Note (Addendum)
 Status post IV Venofer  treatments.  She tolerated well. Labs are reviewed and discussed with patient. Lab Results  Component Value Date   HGB 10.0 (L) 05/03/2023   TIBC 304 05/03/2023   IRONPCTSAT 33 (H) 05/03/2023   FERRITIN 267 05/03/2023   Ferritin is at goal. Hold Venofer .  She will get retacrit  20,000 units today Retacrit  20,000 units every 4 weeks

## 2023-05-06 ENCOUNTER — Inpatient Hospital Stay: Payer: Medicare Other | Attending: Oncology | Admitting: Oncology

## 2023-05-06 ENCOUNTER — Ambulatory Visit: Payer: Medicare Other

## 2023-05-06 ENCOUNTER — Inpatient Hospital Stay: Payer: Medicare Other

## 2023-05-06 ENCOUNTER — Ambulatory Visit: Payer: Medicare Other | Admitting: Clinical

## 2023-05-06 VITALS — BP 123/70 | HR 72 | Temp 96.3°F | Resp 18 | Wt 185.3 lb

## 2023-05-06 DIAGNOSIS — D631 Anemia in chronic kidney disease: Secondary | ICD-10-CM | POA: Diagnosis not present

## 2023-05-06 DIAGNOSIS — N184 Chronic kidney disease, stage 4 (severe): Secondary | ICD-10-CM | POA: Diagnosis not present

## 2023-05-06 MED ORDER — EPOETIN ALFA-EPBX 20000 UNIT/ML IJ SOLN
20000.0000 [IU] | Freq: Once | INTRAMUSCULAR | Status: AC
  Administered 2023-05-06: 20000 [IU] via SUBCUTANEOUS
  Filled 2023-05-06: qty 1

## 2023-05-06 NOTE — Progress Notes (Signed)
 Hematology/Oncology Progress note [phone] [phone]       REFERRING PROVIDER: [name], MD  CHIEF COMPLAINTS/REASON FOR VISIT:  Anemia  ASSESSMENT & PLAN:   Anemia in stage 4 chronic kidney disease (HCC) Status post IV Venofer  treatments.  She tolerated well. Labs are reviewed and discussed with patient. Lab Results  Component Value Date   HGB 10.0 (L) 05/03/2023   TIBC 304 05/03/2023   IRONPCTSAT 33 (H) 05/03/2023   FERRITIN 267 05/03/2023   Ferritin is at goal. Hold Venofer .  She will get retacrit  20,000 units today Retacrit  20,000 units every 4 weeks  Orders Placed This Encounter  Procedures   Hemoglobin and hematocrit, blood    Standing Status:   Future    Expected Date:   07/29/2023    Expiration Date:   05/05/2024   Hemoglobin and hematocrit, blood    Standing Status:   Future    Expected Date:   07/01/2023    Expiration Date:   05/05/2024   Hemoglobin and hematocrit, blood    Standing Status:   Future    Expected Date:   06/03/2023    Expiration Date:   05/05/2024   CBC with Differential (Cancer Center Only)    Standing Status:   Future    Expected Date:   09/03/2023    Expiration Date:   05/05/2024   Iron  and TIBC    Standing Status:   Future    Expected Date:   09/03/2023    Expiration Date:   05/05/2024   Ferritin    Standing Status:   Future    Expected Date:   09/03/2023    Expiration Date:   05/05/2024   Retic Panel    Standing Status:   Future    Expected Date:   09/03/2023    Expiration Date:   05/05/2024   Follow-up  H&H lab in 4w, 8w, 12w +/- retacrit  Follow up in 4 months, labs prior to MD +/- retacrit  CBC, Iron , TIBC Ferritin retic panel   All questions were answered. The patient knows to call the clinic with any problems, questions or concerns.  Zelphia Cap, MD, PhD [facility] Hematology Oncology 05/06/2023     HISTORY OF PRESENTING ILLNESS:  Abigail Figueroa is a  63 y.o.  female with PMH listed below who was  referred to me for anemia Reviewed patient's recent labs that was done.  Patient has progressive worsening of anemia. 07/13/2022, hemoglobin is 8.2, MCV 88.9, normal white count and platelet counts. Patient reports severe fatigue, mild shortness of breath with exertion. She denies recent chest pain on exertion,  , pre-syncopal episodes, or palpitations She had not noticed any recent bleeding such as epistaxis, hematuria.  Patient has intermittent rectal bleeding.  Constipation.  She has upcoming GI appointment for further evaluation.   Patient has chronic kidney disease, stage IV.  She follows up with nephrology.  She has had negative protein electrophoresis.  Light chain ratio was mildly increased which is nonspecific.SABRA  She is on aspirin  81 mg.   INTERVAL HISTORY Abigail Figueroa is a 63 y.o. female who has above history reviewed by me today presents for follow up visit for anemia due to CKD.  Chronic fatigue unchanged. She feels well, no new complaints.   MEDICAL HISTORY:  Past Medical History:  Diagnosis Date   Abnormal stress test    a. 10/2009: Normal myocardial perfusion imaging; b. 08/2015 MV: medium defect of mod severity in mid ant  apical region w/ mild HK of the distal inf wall;  c. 08/2015 Cath: nl Cors, EF 60%.   Anemia    Angio-edema    Anxiety    Arthritis    Bell's palsy    Depression    Dermatitis 09/10/2014   Edema    Essential hypertension    Frequent urination    Frequent urination at night    Headache    migraines, gets botox injections   Heart palpitations 10/2011   Event monitor showing sinus tachycardia, PACs with couplets and triplets.   Hx of echocardiogram    a. 10/2009 Echo: showed normal left ventricular function, mild left ventricular hypertrophy, and no significant valve abnormalities.   Hypercholesterolemia    Migraine    Morbid obesity (HCC)    Neuromuscular disorder (HCC)    OSA (obstructive sleep apnea)    has been on continuous positive  airway pressure   Seizure disorder (HCC)    Seizures (HCC)    Type II diabetes mellitus (HCC)    Urticaria     SURGICAL HISTORY: Past Surgical History:  Procedure Laterality Date   ABDOMINAL HYSTERECTOMY  1990   without BSO   BACK SURGERY  1900s 2001   x2 with cage put in   BILATERAL SALPINGOOPHORECTOMY  1990   CARDIAC CATHETERIZATION N/A 08/06/2015   Procedure: Left Heart Cath and Coronary Angiography;  Surgeon: Debby DELENA Sor, MD;  Location: [facility];  Service: Cardiovascular;  Laterality: N/A;   CARPAL TUNNEL RELEASE Bilateral    CHOLECYSTECTOMY  1980   COLONOSCOPY WITH PROPOFOL  N/A 08/30/2017   Procedure: COLONOSCOPY WITH PROPOFOL ;  Surgeon: Unk Corinn Skiff, MD;  Location: [facility];  Service: Endoscopy;  Laterality: N/A;   DG THUMB LEFT HAND  01/24/2018   repair joint in left thumb   DORSAL COMPARTMENT RELEASE Left 10/25/2014   Procedure: LEFT FIRST  DORSAL COMPARTMENT RELEASE AND RADIAL TENOSYNOVECTOMY ;  Surgeon: Elsie Mussel, MD;  Location: [facility];  Service: Orthopedics;  Laterality: Left;   HAND SURGERY     KNEE SURGERY Right 2012   meniscus tear   KNEE SURGERY  2012   LAMINECTOMY  1995   TUBAL LIGATION  1982   WRIST SURGERY Right     SOCIAL HISTORY: Social History   Socioeconomic History   Marital status: Married    Spouse name: Not on file   Number of children: 2   Years of education: Not on file   Highest education level: 12th grade  Occupational History   Occupation: disabled  Tobacco Use   Smoking status: Former    Current packs/day: 0.00    Types: Cigarettes    Quit date: 01/03/1983    Years since quitting: 40.3   Smokeless tobacco: Never   Tobacco comments:    quit in 1984  Vaping Use   Vaping status: Never Used  Substance and Sexual Activity   Alcohol use: No    Alcohol/week: 0.0 standard drinks of alcohol   Drug use: No   Sexual activity: Never  Other Topics Concern   Not on file  Social History  Narrative   ** Merged History Encounter ** She is a married mother of 2, grandmother 3. She tries to get exercise but is not doing any routine program.She quit smoking over 25 years ago does not drink alcohol.   No caffiene since 03/2022.    Social Drivers of Health   Financial Resource Strain: Low Risk  (12/06/2022)  Overall Financial Resource Strain (CARDIA)    Difficulty of Paying Living Expenses: Not very hard  Recent Concern: Financial Resource Strain - Medium Risk (11/09/2022)   Overall Financial Resource Strain (CARDIA)    Difficulty of Paying Living Expenses: Somewhat hard  Food Insecurity: Food Insecurity Present (12/06/2022)   Hunger Vital Sign    Worried About Running Out of Food in the Last Year: Sometimes true    Ran Out of Food in the Last Year: Sometimes true  Transportation Needs: No Transportation Needs (12/06/2022)   PRAPARE - Administrator, Civil Service (Medical): No    Lack of Transportation (Non-Medical): No  Physical Activity: Sufficiently Active (12/06/2022)   Exercise Vital Sign    Days of Exercise per Week: 5 days    Minutes of Exercise per Session: 40 min  Stress: Stress Concern Present (12/06/2022)   [employer] of Occupational Health - Occupational Stress Questionnaire    Feeling of Stress : To some extent  Social Connections: Socially Integrated (12/06/2022)   Social Connection and Isolation Panel [NHANES]    Frequency of Communication with Friends and Family: More than three times a week    Frequency of Social Gatherings with Friends and Family: Twice a week    Attends Religious Services: More than 4 times per year    Active Member of [employer] or Organizations: Yes    Attends Engineer, Structural: More than 4 times per year    Marital Status: Married  Catering Manager Violence: Not At Risk (12/06/2022)   Humiliation, Afraid, Rape, and Kick questionnaire    Fear of Current or Ex-Partner: No    Emotionally Abused: No    Physically Abused:  No    Sexually Abused: No    FAMILY HISTORY: Family History  Problem Relation Age of Onset   Sarcoidosis Mother    Seizures Mother    Heart attack Mother    Alzheimer's disease Maternal Grandmother    Dementia Maternal Grandmother    Hypertension Maternal Grandfather    Diabetes Maternal Grandfather    Breast cancer Neg Hx    Allergic rhinitis Neg Hx    Asthma Neg Hx    Eczema Neg Hx    Urticaria Neg Hx     ALLERGIES:  is allergic to morphine, morphine and codeine, oxycodone , oxycodone  hcl, aimovig [erenumab-aooe], augmentin  [amoxicillin -pot clavulanate], and morphine sulfate.  MEDICATIONS:  Current Outpatient Medications  Medication Sig Dispense Refill   amLODipine  (NORVASC ) 5 MG tablet Take 1.5 tablet (7.5mg ) by mouth daily. 180 tablet 1   ARIPiprazole (ABILIFY) 5 MG tablet Take 5 mg by mouth daily.     Azelastine  HCl 0.15 % SOLN PLACE 2 SPRAYS INTO BOTH NOSTRILS 2 (TWO) TIMES DAILY (Patient taking differently: Place 2 sprays into both nostrils 2 (two) times daily. Taking as needed) 30 mL 11   Azelastine -Fluticasone  (DYMISTA ) 137-50 MCG/ACT SUSP Place 2 sprays into both nostrils in the morning and at bedtime. 23 g 5   BOTOX 100 UNITS SOLR injection Inject into the muscle every 3 (three) months.      busPIRone  (BUSPAR ) 15 MG tablet TAKE 1 TABLET BY MOUTH TWICE A DAY 180 tablet 1   carbidopa-levodopa (SINEMET IR) 25-100 MG tablet Take 1 tablet by mouth Three (3) times a day. Start with 1/2 pill three times a day, after a week can increase to 1 pill three times a day.     chlorproMAZINE  (THORAZINE ) 25 MG tablet Take 50 mg by mouth 2 (two) times  daily as needed.     chlorzoxazone (PARAFON) 500 MG tablet Take by mouth 4 (four) times daily as needed for muscle spasms.     clotrimazole -betamethasone  (LOTRISONE ) cream Apply 1 Application topically 2 (two) times daily. 45 g 3   EMGALITY 120 MG/ML SOAJ Inject into the skin as needed.     EPINEPHrine  0.3 mg/0.3 mL IJ SOAJ injection Inject  into the muscle.     ezetimibe  (ZETIA ) 10 MG tablet TAKE 1 TABLET BY MOUTH EVERY DAY 90 tablet 2   fluocinolone (SYNALAR) 0.01 % external solution Apply topically as needed.     FLUoxetine (PROZAC) 10 MG capsule Take 40 mg by mouth daily.     fluticasone  (FLONASE ) 50 MCG/ACT nasal spray Place 1-2 sprays into both nostrils as needed for allergies or rhinitis.     furosemide  (LASIX ) 40 MG tablet Take 1 tablet (40 mg total) by mouth daily. 90 tablet 3   lamoTRIgine  (LAMICTAL ) 100 MG tablet Take 100 mg by mouth 2 (two) times daily.     Lancets (ONETOUCH ULTRASOFT) lancets Test fasting each morning and 2 hours before supper. Retest if having hypoglycemic symptoms. (Patient taking differently: 1 each by Other route as needed for other. Test fasting each morning and 2 hours before supper. Retest if having hypoglycemic symptoms.) 100 each 12   metoprolol  tartrate (LOPRESSOR ) 100 MG tablet TAKE 1 TABLET BY MOUTH TWICE A DAY 180 tablet 3   montelukast  (SINGULAIR ) 10 MG tablet Take 1 tablet (10 mg total) by mouth at bedtime. 90 tablet 1   ONETOUCH VERIO test strip TEST FASTING GLUCOSE DAILY AS DIRECTED 100 strip 3   promethazine  (PHENERGAN ) 25 MG tablet Take 25 mg by mouth every 4 (four) hours as needed.     rosuvastatin  (CRESTOR ) 20 MG tablet TAKE 1 TABLET BY MOUTH EVERY DAY 60 tablet 0   Semaglutide ,0.25 or 0.5MG /DOS, (OZEMPIC , 0.25 OR 0.5 MG/DOSE,) 2 MG/3ML SOPN Inject 0.25 mg into the skin once a week. (Patient taking differently: Inject 0.5 mg into the skin once a week.) 3 mL 0   TEGRETOL -XR 400 MG 12 hr tablet TAKE 1 TABLET BY MOUTH TWICE A DAY 60 tablet 0   tretinoin (RETIN-A) 0.025 % cream Apply topically at bedtime.     triamcinolone  ointment (KENALOG ) 0.1 % Apply 1 Application topically 2 (two) times daily.     No current facility-administered medications for this visit.    Review of Systems  Constitutional:  Positive for fatigue. Negative for appetite change, chills and fever.  HENT:    Negative for hearing loss and voice change.   Eyes:  Negative for eye problems.  Respiratory:  Negative for chest tightness and cough.   Cardiovascular:  Negative for chest pain.  Gastrointestinal:  Negative for abdominal distention, abdominal pain and blood in stool.  Endocrine: Negative for hot flashes.  Genitourinary:  Negative for difficulty urinating and frequency.   Musculoskeletal:  Negative for arthralgias.  Skin:  Negative for itching and rash.  Neurological:  Negative for extremity weakness.  Hematological:  Negative for adenopathy.  Psychiatric/Behavioral:  Negative for confusion.     PHYSICAL EXAMINATION: ECOG PERFORMANCE STATUS: 1 - Symptomatic but completely ambulatory Vitals:   05/06/23 1105  BP: 123/70  Pulse: 72  Resp: 18  Temp: (!) 96.3 F (35.7 C)  SpO2: 99%   Filed Weights   05/06/23 1105  Weight: 185 lb 4.8 oz (84.1 kg)    Physical Exam Constitutional:      General: She is not  in acute distress. HENT:     Head: Normocephalic and atraumatic.  Eyes:     General: No scleral icterus. Cardiovascular:     Rate and Rhythm: Normal rate and regular rhythm.     Heart sounds: Normal heart sounds.  Pulmonary:     Effort: Pulmonary effort is normal. No respiratory distress.     Breath sounds: No wheezing.  Abdominal:     General: Bowel sounds are normal. There is no distension.     Palpations: Abdomen is soft.  Musculoskeletal:        General: No deformity. Normal range of motion.     Cervical back: Normal range of motion and neck supple.  Skin:    General: Skin is warm and dry.     Findings: No erythema or rash.  Neurological:     Mental Status: She is alert and oriented to person, place, and time. Mental status is at baseline.     Cranial Nerves: No cranial nerve deficit.     Coordination: Coordination normal.  Psychiatric:        Mood and Affect: Mood normal.      LABORATORY DATA:  I have reviewed the data as listed    Latest Ref Rng & Units  05/03/2023   12:05 PM 03/29/2023   11:40 AM 03/01/2023   11:29 AM  CBC  WBC 4.0 - 10.5 K/uL 5.6     Hemoglobin 12.0 - 15.0 g/dL 89.9  9.7  89.8   Hematocrit 36.0 - 46.0 % 32.0  29.9  32.0   Platelets 150 - 400 K/uL 197         Latest Ref Rng & Units 07/13/2022   12:29 AM 06/15/2022    2:38 PM 04/12/2022   12:18 PM  CMP  Glucose 70 - 99 mg/dL 851  877  877   BUN 8 - 23 mg/dL 40  27  24   Creatinine 0.44 - 1.00 mg/dL 7.75  7.59  7.62   Sodium 135 - 145 mmol/L 139  143  140   Potassium 3.5 - 5.1 mmol/L 3.5  4.4  4.2   Chloride 98 - 111 mmol/L 104  105  104   CO2 22 - 32 mmol/L 26  22  22    Calcium  8.9 - 10.3 mg/dL 9.0  89.5  89.6   Total Protein 6.5 - 8.1 g/dL 7.3  7.5  7.8   Total Bilirubin 0.3 - 1.2 mg/dL 0.4  <9.7    Alkaline Phos 38 - 126 U/L 86  92    AST 15 - 41 U/L 218  29    ALT 0 - 44 U/L 80  34        Component Value Date/Time   IRON  99 05/03/2023 1205   TIBC 304 05/03/2023 1205   FERRITIN 267 05/03/2023 1205   IRONPCTSAT 33 (H) 05/03/2023 1205     RADIOGRAPHIC STUDIES: I have personally reviewed the radiological images as listed and agreed with the findings in the report. No results found.

## 2023-05-07 ENCOUNTER — Other Ambulatory Visit: Payer: Self-pay | Admitting: Cardiovascular Disease

## 2023-05-09 ENCOUNTER — Ambulatory Visit (INDEPENDENT_AMBULATORY_CARE_PROVIDER_SITE_OTHER): Payer: Medicare Other | Admitting: Clinical

## 2023-05-09 DIAGNOSIS — F332 Major depressive disorder, recurrent severe without psychotic features: Secondary | ICD-10-CM

## 2023-05-09 DIAGNOSIS — F411 Generalized anxiety disorder: Secondary | ICD-10-CM

## 2023-05-09 NOTE — Progress Notes (Signed)
   Doree Barthel, LCSW

## 2023-05-09 NOTE — Progress Notes (Signed)
 [facility] Counselor/Therapist Progress Note  Patient ID: Abigail Figueroa, MRN: [id number],    Date: 05/09/2023  Time Spent: 12:34pm - 1:22pm : 48 minutes   Treatment Type: Individual Therapy  Reported Symptoms: Patient reported low mood  Mental Status Exam: Appearance:  Neat and Well Groomed     Behavior: Appropriate  Motor: Normal  Speech/Language:  Clear and Coherent  Affect: Appropriate  Mood: Patient stated, I'm feeling a little low  Thought process: normal  Thought content:   WNL  Sensory/Perceptual disturbances:   WNL  Orientation: oriented to person, place, and situation  Attention: Good  Concentration: Good  Memory: WNL  Fund of knowledge:  Good  Insight:   Good  Judgment:  Good  Impulse Control: Good   Risk Assessment: Danger to Self:  No Patient denied current suicidal ideation  Self-injurious Behavior: No Danger to Others: No Patient denied current homicidal ideation Duty to Warn:no Physical Aggression / Violence:No  Access to Firearms a concern: No  Gang Involvement:No   Subjective: Patient stated, they've been going ok, I've just been dealing with a lot with my Parkinson's, a lot of tremors going on in response to events since last session.  Patient stated, I've had some good days. Patient reported the medication patient is prescribed to treat Parkinson's Disease was increased three weeks ago and patient stated, its not like I thought it was going to be in reference to improvement in symptoms. Patient stated, I know it's not going to be 100% in reference to medication. Patient reported her granddaughter asked patient about the tremors and patient reported she told her granddaughter she has a sickness. Patient stated, I felt sad, I felt bad in response to conversation with granddaughter. Patient stated, she noticed something was wrong with me and reported she doesn't like her granddaughter seeing patient shake. Patient stated,  I don't like that in reference to granddaughter witnessing tremors and stated, I think I'm suppose to be that perfect grandma. Patient stated, I feel like I'm not normal and I want to be normal. Patient stated, normal is I don't shake, I don't want people to see me shake. Patient reported concern that people are going to talk about it and ask the question, what is wrong with her. Patient stated, I'm feeling a little low in response to patient's current mood. Patient stated, I haven't messed with it hardly any in response to patient's gratitude journal. Patient reported she has not completed gratitude journal due to tremors.   Interventions: Cognitive Behavioral Therapy. Clinician conducted session via caregility video from clinician's home office. Patient provided verbal consent to proceed with telehealth session and is aware of limitations of telephone or video visits. Patient participated in session from patient's home. Reviewed events since last session. Discussed patient initiating a conversation with patient's neurologist to discuss patient's concerns related to outcome of recent increase in medication. Discussed patient's recent conversation with patient's granddaughter and assisted patient in exploring and identifying thoughts/feelings triggered by conversation with granddaughter. Challenged cognitive distortions and assisted patient in reframing situations. Explored patient's perception of normal and patient's thoughts/feelings related to tremors. Reviewed patient's gratitude journal and explored barriers to completing gratitude journal. Clinician requested patient identify at minimum one positive for each day, continue gratitude letter, and practice challenging cognitive distortions for homework.    Collaboration of Care: not required at this time   Diagnosis:  Major Depressive Disorder, recurrent, severe without psychotic features   Generalized anxiety disorder   Plan: Patient  is to utilize [employer] Therapy, thought re-framing, relaxation techniques, mindfulness, effective communication, and coping strategies to decrease symptoms associated with Major Depressive Disorder and Generalized Anxiety Disorder. Frequency: weekly  Modality: individual      Long-term goal:   Patient stated, I want to see a change in the anxiety.      Reduce overall level, frequency, and intensity of the symptoms of anxiety and depression as evidenced by decreased sadness, feelings of anxiety, feeling lost, lack of energy, difficulty falling and staying asleep, changes in concentration, psychomotor retardation, rapid heart rate, excessive worry, restlessness, feeling jittery, and feeling on edge from 5 to 6 days per week to 0 to 1 days per week per patient reported for at least 3 consecutive months.   Target Date: 02/11/24  Progress: progressing    Short-term goal:  Develop coping strategies to utilize in response to symptoms of depression/anxiety and stressors Patient stated, I think I'm half way to that way Target Date: 08/12/23  Progress: progressing    Verbally express patient's thoughts and feelings to others and utilize effective communication strategies when expressing patient's thoughts/feelings    Target Date: 08/12/23  Progress: progressing    Verbalize an understanding of the relationship between symptoms of anxiety and the impact on patient's thought patterns and behaviors    Target Date: 08/12/23  Progress: progressing    Darice Seats, LCSW

## 2023-05-10 DIAGNOSIS — M542 Cervicalgia: Secondary | ICD-10-CM | POA: Diagnosis not present

## 2023-05-10 DIAGNOSIS — M791 Myalgia, unspecified site: Secondary | ICD-10-CM | POA: Diagnosis not present

## 2023-05-10 DIAGNOSIS — G43719 Chronic migraine without aura, intractable, without status migrainosus: Secondary | ICD-10-CM | POA: Diagnosis not present

## 2023-05-20 ENCOUNTER — Ambulatory Visit (INDEPENDENT_AMBULATORY_CARE_PROVIDER_SITE_OTHER): Payer: Medicare Other | Admitting: Clinical

## 2023-05-20 DIAGNOSIS — F332 Major depressive disorder, recurrent severe without psychotic features: Secondary | ICD-10-CM | POA: Diagnosis not present

## 2023-05-20 DIAGNOSIS — F411 Generalized anxiety disorder: Secondary | ICD-10-CM

## 2023-05-20 NOTE — Progress Notes (Signed)
                Dezi Schaner, LCSW 

## 2023-05-20 NOTE — Progress Notes (Signed)
[facility] Counselor/Therapist Progress Note  Patient ID: AYVREE RICKER, MRN: [id number],    Date: 05/20/2023  Time Spent: 12:34pm - 1:18pm : 44 minutes   Treatment Type: Individual Therapy  Reported Symptoms: Patient reported depressed mood recently  Mental Status Exam: Appearance:  Neat and Well Groomed     Behavior: Appropriate  Motor: Normal  Speech/Language:  Clear and Coherent  Affect: Appropriate  Mood: normal  Thought process: normal  Thought content:   WNL  Sensory/Perceptual disturbances:   WNL  Orientation: oriented to person, place, and situation  Attention: Good  Concentration: Good  Memory: WNL  Fund of knowledge:  Good  Insight:   Good  Judgment:  Good  Impulse Control: Good   Risk Assessment: Danger to Self:  No Patient denied current suicidal ideation  Self-injurious Behavior: No Danger to Others: No Patient denied current homicidal ideation Duty to Warn:no Physical Aggression / Violence:No  Access to Firearms a concern: No  Gang Involvement:No   Subjective: Patient stated, "they've been going ok, I've been hanging in there" in response to events since last session. Patient reported she had a recent appointment with her psychiatrist and reported the psychiatrist increased patient's dosage of Prozac last Friday. Patient stated, "bout the same, a little bit better" in response to the  patient's mood. Patient reported not being able to spend time outside due to weather is a trigger for decline in mood. Patient reported she recently took her dog outside and stated "I feel pretty good about it". Patient stated, "at the front side of our house the sun shines so bright". Patient reported she recently open the door to let the sunlight in and stood in front of the door. Patient stated "that felt so good" in reference to standing in front of the door.  Patient stated, "it makes me feel so much better" in reference to patient getting out of the house.  Patient reported isolation is a trigger for decline in mood. Patient stated, "I hadn't been able to read my bible study lessons" and reported she started reading the material on the way to an appointment. Patient reported she read the material twice. Patient stated, "I was proud of myself, I was able to read and participate in Bible study on Wednesday night".  Patient stated, "pretty good" in response to patient's mood.   Interventions: Cognitive Behavioral Therapy. Clinician conducted session via caregility video from clinician's home office. Patient provided verbal consent to proceed with telehealth session and is aware of limitations of telephone or video visits. Patient participated in session from patient's home. Reviewed events since last session. Discussed patient's recent appointment with the psychiatrist and increase in medication. Assessed patient's mood since last session and current mood. Assisted patient in exploring and identifying triggers for recent decline in mood. Discussed coping strategies to decrease depressed mood, such as, spending time outside during the warmest part of the day, projecting nature images or image of a sunlight on television screen, going for a ride with her husband, sitting in the room that gets sunlight each day, sitting in a chair in front of the living room door to view the outdoors, allowing light in the house.  Reviewed thought record and gratitude journal. Clinician requested patient identify at minimum one positive for each day, continue gratitude letter, and practice challenging cognitive distortions for homework.      Collaboration of Care: not required at this time   Diagnosis:  Major Depressive Disorder, recurrent, severe without psychotic features  Generalized anxiety disorder   Plan: Patient is to utilize [employer] Therapy, thought re-framing, relaxation techniques, mindfulness, effective communication, and coping strategies to decrease  symptoms associated with Major Depressive Disorder and Generalized Anxiety Disorder. Frequency: weekly  Modality: individual      Long-term goal:   Patient stated, "I want to see a change in the anxiety".      Reduce overall level, frequency, and intensity of the symptoms of anxiety and depression as evidenced by decreased sadness, feelings of anxiety, feeling "lost", lack of energy, difficulty falling and staying asleep, changes in concentration, psychomotor retardation, rapid heart rate, excessive worry, restlessness, feeling jittery, and feeling on edge from 5 to 6 days per week to 0 to 1 days per week per patient reported for at least 3 consecutive months.   Target Date: 02/11/24  Progress: progressing    Short-term goal:  Develop coping strategies to utilize in response to symptoms of depression/anxiety and stressors Patient stated, "I think I'm half way to that way" Target Date: 08/12/23  Progress: progressing    Verbally express patient's thoughts and feelings to others and utilize effective communication strategies when expressing patient's thoughts/feelings    Target Date: 08/12/23  Progress: progressing    Verbalize an understanding of the relationship between symptoms of anxiety and the impact on patient's thought patterns and behaviors    Target Date: 08/12/23  Progress: progressing    Doree Barthel, LCSW

## 2023-05-25 DIAGNOSIS — D3501 Benign neoplasm of right adrenal gland: Secondary | ICD-10-CM | POA: Diagnosis not present

## 2023-05-25 DIAGNOSIS — D3502 Benign neoplasm of left adrenal gland: Secondary | ICD-10-CM | POA: Diagnosis not present

## 2023-05-27 ENCOUNTER — Ambulatory Visit (INDEPENDENT_AMBULATORY_CARE_PROVIDER_SITE_OTHER): Payer: Medicare Other | Admitting: Clinical

## 2023-05-27 DIAGNOSIS — F332 Major depressive disorder, recurrent severe without psychotic features: Secondary | ICD-10-CM | POA: Diagnosis not present

## 2023-05-27 DIAGNOSIS — F411 Generalized anxiety disorder: Secondary | ICD-10-CM

## 2023-05-27 NOTE — Progress Notes (Signed)
Doree Barthel, LCSW

## 2023-05-27 NOTE — Progress Notes (Signed)
[facility] Counselor/Therapist Progress Note  Patient ID: Abigail Figueroa, MRN: [id number],    Date: 05/27/2023  Time Spent: 12:32pm - 1:18pm : 46 minutes   Treatment Type: Individual Therapy  Reported Symptoms: Patient reported recent improvement in mood  Mental Status Exam: Appearance:  Neat and Well Groomed     Behavior: Appropriate  Motor: Normal  Speech/Language:  Clear and Coherent  Affect: Appropriate  Mood: normal  Thought process: normal  Thought content:   WNL  Sensory/Perceptual disturbances:   WNL  Orientation: oriented to person, place, and situation  Attention: Good  Concentration: Good  Memory: WNL  Fund of knowledge:  Good  Insight:   Good  Judgment:  Good  Impulse Control: Good   Risk Assessment: Danger to Self:  No Patient denied current suicidal ideation  Self-injurious Behavior: No Danger to Others: No Patient denied current homicidal ideation Duty to Warn:no Physical Aggression / Violence:No  Access to Firearms a concern: No  Gang Involvement:No   Subjective: Patient stated, "they've been going pretty good, I've been doing pretty good", "I went outside for a while for a few, been on the deck", "I've been getting some fresh air" in response to events since last session. Patient stated, "I think I'm not as depressed", "it's like a pick me upper". Patient reported she has been opening the blinds in the home to allow sunlight in, opening the door and stated, "that's been working too". Patient reported she has been sitting at the kitchen table and looking out the window more frequently. Patient stated, "I've been doing some things to pick me up some and not me feel so depressed". Patient reported getting out of the house and going for a ride this morning was beneficial to patient's mood. Patient reported feeling isolated is a trigger for decline in mood. Patient stated, "it brings more of a positive mood to me" in reference to time spent out of  the house. Patient reported positive changes in patient/husband's relationship have contributed to improvement in patient's mood. Patient reported she plans to ask her husband about going for a ride to the lake next week. Patient stated,  "not so good" in response to completion of gratitude journal. Patient stated,  "I don't know" in response to barriers to completing journal. Patient stated, "oh good" in response to patient's current mood.  Interventions: Cognitive Behavioral Therapy and Dialectical Behavioral Therapy. Clinician conducted session via caregility video from clinician's home office. Patient provided verbal consent to proceed with telehealth session and is aware of limitations of telephone or video visits. Patient participated in session from patient's home. Reviewed events since last session. Explored recent changes in patient's mood and contributing factors to recent change. Reviewed strategies discussed during previous session to decrease depressed mood and the outcome. Examined the impact of isolation on patient's mood. Explored and identified additional strategies to decrease depressed mood and feelings of isolation, such as, window shopping, visiting son, going to the park and have a picnic when weather is warmer, going to the lake, going fishing, going to [employer]. Provided psycho education related to the DBT skill of opposite action. Reviewed patient's gratitude journal and explored barriers to patient completing gratitude journal. Reviewed patient's homework to challenge cognitive distortions. Clinician requested patient identify at minimum one positive for each day, continue gratitude letter, practice challenging cognitive distortions for homework, and practice opposite action.     Collaboration of Care: not required at this time   Diagnosis:  Major Depressive Disorder,  recurrent, severe without psychotic features   Generalized anxiety disorder   Plan: Patient is to utilize  [employer] Therapy, thought re-framing, relaxation techniques, mindfulness, effective communication, and coping strategies to decrease symptoms associated with Major Depressive Disorder and Generalized Anxiety Disorder. Frequency: weekly  Modality: individual      Long-term goal:   Patient stated, "I want to see a change in the anxiety".      Reduce overall level, frequency, and intensity of the symptoms of anxiety and depression as evidenced by decreased sadness, feelings of anxiety, feeling "lost", lack of energy, difficulty falling and staying asleep, changes in concentration, psychomotor retardation, rapid heart rate, excessive worry, restlessness, feeling jittery, and feeling on edge from 5 to 6 days per week to 0 to 1 days per week per patient reported for at least 3 consecutive months.   Target Date: 02/11/24  Progress: progressing    Short-term goal:  Develop coping strategies to utilize in response to symptoms of depression/anxiety and stressors Patient stated, "I think I'm half way to that way" Target Date: 08/12/23  Progress: progressing    Verbally express patient's thoughts and feelings to others and utilize effective communication strategies when expressing patient's thoughts/feelings    Target Date: 08/12/23  Progress: progressing    Verbalize an understanding of the relationship between symptoms of anxiety and the impact on patient's thought patterns and behaviors    Target Date: 08/12/23  Progress: progressing     Doree Barthel, LCSW

## 2023-06-02 ENCOUNTER — Encounter: Payer: Self-pay | Admitting: Oncology

## 2023-06-03 ENCOUNTER — Inpatient Hospital Stay: Payer: Medicare Other

## 2023-06-03 ENCOUNTER — Ambulatory Visit: Payer: Medicare Other | Admitting: Clinical

## 2023-06-03 VITALS — BP 132/74

## 2023-06-03 DIAGNOSIS — D631 Anemia in chronic kidney disease: Secondary | ICD-10-CM

## 2023-06-03 DIAGNOSIS — N184 Chronic kidney disease, stage 4 (severe): Secondary | ICD-10-CM | POA: Diagnosis not present

## 2023-06-03 LAB — HEMOGLOBIN AND HEMATOCRIT, BLOOD
HCT: 31.6 % — ABNORMAL LOW (ref 36.0–46.0)
Hemoglobin: 9.9 g/dL — ABNORMAL LOW (ref 12.0–15.0)

## 2023-06-03 MED ORDER — EPOETIN ALFA-EPBX 20000 UNIT/ML IJ SOLN
20000.0000 [IU] | Freq: Once | INTRAMUSCULAR | Status: AC
Start: 2023-06-03 — End: 2023-06-03
  Administered 2023-06-03: 20000 [IU] via SUBCUTANEOUS
  Filled 2023-06-03: qty 1

## 2023-06-05 DIAGNOSIS — D3501 Benign neoplasm of right adrenal gland: Secondary | ICD-10-CM | POA: Diagnosis not present

## 2023-06-05 DIAGNOSIS — D3502 Benign neoplasm of left adrenal gland: Secondary | ICD-10-CM | POA: Diagnosis not present

## 2023-06-06 DIAGNOSIS — D3502 Benign neoplasm of left adrenal gland: Secondary | ICD-10-CM | POA: Diagnosis not present

## 2023-06-06 DIAGNOSIS — D3501 Benign neoplasm of right adrenal gland: Secondary | ICD-10-CM | POA: Diagnosis not present

## 2023-06-08 DIAGNOSIS — G4733 Obstructive sleep apnea (adult) (pediatric): Secondary | ICD-10-CM | POA: Diagnosis not present

## 2023-06-10 ENCOUNTER — Ambulatory Visit (INDEPENDENT_AMBULATORY_CARE_PROVIDER_SITE_OTHER): Payer: Medicare Other | Admitting: Clinical

## 2023-06-10 DIAGNOSIS — F332 Major depressive disorder, recurrent severe without psychotic features: Secondary | ICD-10-CM | POA: Diagnosis not present

## 2023-06-10 DIAGNOSIS — F411 Generalized anxiety disorder: Secondary | ICD-10-CM | POA: Diagnosis not present

## 2023-06-10 NOTE — Progress Notes (Signed)
   Doree Barthel, LCSW
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
## 2023-06-16 DIAGNOSIS — E248 Other Cushing's syndrome: Secondary | ICD-10-CM | POA: Diagnosis not present

## 2023-06-16 DIAGNOSIS — D3502 Benign neoplasm of left adrenal gland: Secondary | ICD-10-CM | POA: Diagnosis not present

## 2023-06-16 DIAGNOSIS — D3501 Benign neoplasm of right adrenal gland: Secondary | ICD-10-CM | POA: Diagnosis not present

## 2023-06-16 DIAGNOSIS — E2609 Other primary hyperaldosteronism: Secondary | ICD-10-CM | POA: Diagnosis not present

## 2023-06-24 ENCOUNTER — Ambulatory Visit: Payer: Medicare Other | Admitting: Clinical

## 2023-06-24 ENCOUNTER — Encounter: Payer: Self-pay | Admitting: Oncology

## 2023-06-24 DIAGNOSIS — F411 Generalized anxiety disorder: Secondary | ICD-10-CM

## 2023-06-24 DIAGNOSIS — F332 Major depressive disorder, recurrent severe without psychotic features: Secondary | ICD-10-CM | POA: Diagnosis not present

## 2023-06-24 NOTE — Progress Notes (Signed)
   Doree Barthel, LCSW
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
## 2023-06-25 ENCOUNTER — Other Ambulatory Visit: Payer: Self-pay | Admitting: Adult Health

## 2023-06-27 ENCOUNTER — Other Ambulatory Visit: Payer: Self-pay | Admitting: Family Medicine

## 2023-06-27 DIAGNOSIS — E119 Type 2 diabetes mellitus without complications: Secondary | ICD-10-CM | POA: Diagnosis not present

## 2023-06-27 DIAGNOSIS — N1832 Chronic kidney disease, stage 3b: Secondary | ICD-10-CM | POA: Diagnosis not present

## 2023-06-29 DIAGNOSIS — M542 Cervicalgia: Secondary | ICD-10-CM | POA: Diagnosis not present

## 2023-06-29 DIAGNOSIS — M791 Myalgia, unspecified site: Secondary | ICD-10-CM | POA: Diagnosis not present

## 2023-06-29 DIAGNOSIS — G43719 Chronic migraine without aura, intractable, without status migrainosus: Secondary | ICD-10-CM | POA: Diagnosis not present

## 2023-07-01 ENCOUNTER — Inpatient Hospital Stay: Payer: Medicare Other | Attending: Oncology

## 2023-07-01 ENCOUNTER — Inpatient Hospital Stay: Payer: Medicare Other

## 2023-07-01 DIAGNOSIS — N184 Chronic kidney disease, stage 4 (severe): Secondary | ICD-10-CM | POA: Diagnosis not present

## 2023-07-01 DIAGNOSIS — D631 Anemia in chronic kidney disease: Secondary | ICD-10-CM

## 2023-07-01 LAB — HEMOGLOBIN AND HEMATOCRIT, BLOOD
HCT: 31.9 % — ABNORMAL LOW (ref 36.0–46.0)
Hemoglobin: 10.2 g/dL — ABNORMAL LOW (ref 12.0–15.0)

## 2023-07-01 NOTE — Progress Notes (Signed)
 Hemoglobin 10.2 today, Retacrit not given.

## 2023-07-03 ENCOUNTER — Other Ambulatory Visit: Payer: Self-pay | Admitting: Allergy & Immunology

## 2023-07-04 ENCOUNTER — Encounter: Payer: Self-pay | Admitting: Oncology

## 2023-07-04 DIAGNOSIS — D3501 Benign neoplasm of right adrenal gland: Secondary | ICD-10-CM | POA: Diagnosis not present

## 2023-07-04 DIAGNOSIS — D3502 Benign neoplasm of left adrenal gland: Secondary | ICD-10-CM | POA: Diagnosis not present

## 2023-07-04 DIAGNOSIS — E248 Other Cushing's syndrome: Secondary | ICD-10-CM | POA: Diagnosis not present

## 2023-07-04 DIAGNOSIS — E1122 Type 2 diabetes mellitus with diabetic chronic kidney disease: Secondary | ICD-10-CM | POA: Diagnosis not present

## 2023-07-04 DIAGNOSIS — H18603 Keratoconus, unspecified, bilateral: Secondary | ICD-10-CM | POA: Diagnosis not present

## 2023-07-04 DIAGNOSIS — N184 Chronic kidney disease, stage 4 (severe): Secondary | ICD-10-CM | POA: Diagnosis not present

## 2023-07-05 DIAGNOSIS — H5213 Myopia, bilateral: Secondary | ICD-10-CM | POA: Diagnosis not present

## 2023-07-07 ENCOUNTER — Other Ambulatory Visit: Payer: Self-pay | Admitting: Allergy & Immunology

## 2023-07-08 ENCOUNTER — Other Ambulatory Visit: Payer: Self-pay | Admitting: Family Medicine

## 2023-07-08 ENCOUNTER — Ambulatory Visit: Payer: Medicare Other | Admitting: Clinical

## 2023-07-08 DIAGNOSIS — F411 Generalized anxiety disorder: Secondary | ICD-10-CM | POA: Diagnosis not present

## 2023-07-08 DIAGNOSIS — F332 Major depressive disorder, recurrent severe without psychotic features: Secondary | ICD-10-CM

## 2023-07-08 DIAGNOSIS — F331 Major depressive disorder, recurrent, moderate: Secondary | ICD-10-CM

## 2023-07-08 NOTE — Telephone Encounter (Signed)
 Reordered 03/24/23 #180 1 RF  Requested Prescriptions  Refused Prescriptions Disp Refills   busPIRone (BUSPAR) 15 MG tablet [Pharmacy Med Name: BUSPIRONE HCL 15 MG TABLET] 180 tablet 1    Sig: TAKE 1 TABLET BY MOUTH TWICE A DAY     Psychiatry: Anxiolytics/Hypnotics - Non-controlled Passed - 07/08/2023 12:52 PM      Passed - Valid encounter within last 12 months    Recent Outpatient Visits           8 months ago Nausea   [facility] [name], PA-C   8 months ago Tremor   [facility] [name], PA-C   8 months ago Tremor   [facility] [name], PA-C   9 months ago Acute non-recurrent frontal sinusitis   [facility] [name], PA-C   11 months ago CKD (chronic kidney disease) stage 4, GFR 15-29 ml/min [facility])   [facility] [name], PA-C       Future Appointments             In 2 months Bacigalupo, Marzella Schlein, MD [facility]   In 6 months Dellis Anes, Hetty Ely, MD [facility]

## 2023-07-08 NOTE — Progress Notes (Signed)
   Doree Barthel, LCSW

## 2023-07-08 NOTE — Progress Notes (Signed)
 [facility] Counselor/Therapist Progress Note  Patient ID: SIREEN HALK, MRN: [id number],    Date: 07/08/2023  Time Spent: 12:34pm - 1:20pm : 46 minutes   Treatment Type: Individual Therapy  Reported Symptoms: Patient reported recent improvement in mood  Mental Status Exam: Appearance:  Neat and Well Groomed     Behavior: Appropriate  Motor: Normal  Speech/Language:  Clear and Coherent and Normal Rate  Affect: Appropriate  Mood: normal  Thought process: normal  Thought content:   WNL  Sensory/Perceptual disturbances:   WNL  Orientation: oriented to person, place, situation, and day of week  Attention: Good  Concentration: Good  Memory: WNL  Fund of knowledge:  Good  Insight:   Good  Judgment:  Good  Impulse Control: Good   Risk Assessment: Danger to Self:  No Patient denied current suicidal ideation  Self-injurious Behavior: No Danger to Others: No Patient denied current homicidal ideation Duty to Warn:no Physical Aggression / Violence:No  Access to Firearms a concern: No  Gang Involvement:No   Subjective: Patient stated, "its going pretty good" in response to events since last session. Patient reported she has had multiple medical appointments since last session and reported she has received positive feedback during recent appointments. Patient stated, "its been pretty good" in response to patient's mood since last session.  Patient stated, "me having good reports at the doctors", "I didn't have to have a shot at the doctors", "plus the weather is beginning to make a turn I believe" in response to triggers for improvement in mood. Patient reported she stayed outside for 20-30 minutes recently. Patient stated, "I've been thinking about a lot of things, challenging things dealing with my brother, and thinking about the things I can't change", "I've been working on it". Patient stated, "I don't have the evidence" in reference to thoughts associated with  patient's brother. Patient stated, "it's been helpful" in response to patient's implementation of challenging cognitive distortions. Patient reported she has experienced a change in patient's perception related to patient's brother serving as Publishing rights manager. Patient stated, "it helped me so much" in reference to challenging cognitive distortions. Patient reported when family members make assumptions that impact patient, patient has been asking family members the question "what evidence do you have" and patient stated, "I feel like it has helped me". Patient stated, "It makes me feel good to use that with someone else", "talking through it out loud", "the feedback". Patient stated, "its been going pretty good" in response to identifying one positive for each day.    Interventions: Cognitive Behavioral Therapy.  Clinician conducted session via caregility video from clinician's home office. Patient provided verbal consent to proceed with telehealth session and is aware of limitations of telephone or video visits. Patient participated in session from patient's home. Reviewed events since last session. Assessed patient's mood since last session and current mood. Explored and identified triggers for recent improvement in patient's mood.  Reviewed patient's implementation of challenging cognitive distortions and the outcome. Provided psycho education related to cognitive distortions/unhelpful thinking styles. Reviewed patient's homework to identify one positive for each day. Clinician requested patient continue to identify at minimum one positive for each day and practice challenging cognitive distortions for homework.   Collaboration of Care: not required at this time   Diagnosis:  Major Depressive Disorder, recurrent, severe without psychotic features   Generalized anxiety disorder   Plan: Patient is to utilize [employer] Therapy, thought re-framing, relaxation techniques, mindfulness, effective  communication, and coping strategies  to decrease symptoms associated with Major Depressive Disorder and Generalized Anxiety Disorder. Frequency: weekly  Modality: individual      Long-term goal:   Patient stated, "I want to see a change in the anxiety".      Reduce overall level, frequency, and intensity of the symptoms of anxiety and depression as evidenced by decreased sadness, feelings of anxiety, feeling "lost", lack of energy, difficulty falling and staying asleep, changes in concentration, psychomotor retardation, rapid heart rate, excessive worry, restlessness, feeling jittery, and feeling on edge from 5 to 6 days per week to 0 to 1 days per week per patient reported for at least 3 consecutive months.   Target Date: 02/11/24  Progress: progressing    Short-term goal:  Develop coping strategies to utilize in response to symptoms of depression/anxiety and stressors Patient stated, "I think I'm half way to that way" Target Date: 08/12/23  Progress: progressing    Verbally express patient's thoughts and feelings to others and utilize effective communication strategies when expressing patient's thoughts/feelings    Target Date: 08/12/23  Progress: progressing    Verbalize an understanding of the relationship between symptoms of anxiety and the impact on patient's thought patterns and behaviors    Target Date: 08/12/23  Progress: progressing     Doree Barthel, LCSW

## 2023-07-15 ENCOUNTER — Ambulatory Visit: Payer: Medicare Other | Admitting: Clinical

## 2023-07-15 DIAGNOSIS — F332 Major depressive disorder, recurrent severe without psychotic features: Secondary | ICD-10-CM

## 2023-07-15 DIAGNOSIS — F411 Generalized anxiety disorder: Secondary | ICD-10-CM | POA: Diagnosis not present

## 2023-07-15 NOTE — Progress Notes (Signed)
 [facility] Counselor/Therapist Progress Note  Patient ID: Abigail Figueroa, MRN: [id number],    Date: 07/15/2023  Time Spent: 12:35pm -1:19pm : 44 minutes   Treatment Type: Individual Therapy  Reported Symptoms: none reported  Mental Status Exam: Appearance:  Neat and Well Groomed     Behavior: Appropriate  Motor: Normal  Speech/Language:  Clear and Coherent and Normal Rate  Affect: Appropriate  Mood: normal  Thought process: normal  Thought content:   WNL  Sensory/Perceptual disturbances:   WNL  Orientation: oriented to person, place, and situation  Attention: Good  Concentration: Good  Memory: WNL  Fund of knowledge:  Good  Insight:   Good  Judgment:  Good  Impulse Control: Good   Risk Assessment: Danger to Self:  No Patient denied current suicidal ideation  Self-injurious Behavior: No Danger to Others: No Patient denied current homicidal ideation Duty to Warn:no Physical Aggression / Violence:No  Access to Firearms a concern: No  Gang Involvement:No   Subjective: Patient stated, "it's been going pretty good", "I've been hanging in there enjoying the weather" in response to events since last session. Patient reported she has spent time outside since last session. Patient stated, "my mood has been pretty good I think".  Patient reported her husband mentioned taking patient somewhere tomorrow and patient stated, "I hope it's the lake". Patient reported she feels comfortable asking her husband about the possibility of going to the lake tomorrow. Patient reported she has been playing video games with patient's daughter and patient's grandson. Patient stated, "I did some work around the house". Patient stated,  "it doesn't have me as stressed out", "I'm not constantly letting that play over and over in my head" in response to distressing thoughts/worry. Patient stated, "I've been doing better". Patient stated, "its been easy", "I think I did pretty good" in  response to challenging assumptions/cognitive distortions. Patient stated, "Its a whole lot better than it was, it was weighing me down" in reference to assumptions/cognitive distortions. Patient reported she has observed herself making assumptions when talking to patient's brother, acknowledged the assumptions, and reported she tries to stop the thoughts. Patient stated, "our conversation is more relaxing" in reference to patient's conversation with patient's brother. Patient stated, "I'm trying my best" in response to patient's gratitude journal.   Interventions: Cognitive Behavioral Therapy. Clinician conducted session via caregility video from clinician's home office. Patient provided verbal consent to proceed with telehealth session and is aware of limitations of telephone or video visits. Patient participated in session from patient's home. Reviewed events since last session. Assessed patient's mood. Reviewed patient's activity level and participation in enjoyable activities since last session. Assessed intensity and frequency of  anxiety and cognitive distortions since last session. Reviewed patient's implementation of challenging cognitive distortions and the outcome. Discussed ways in which patient's implementation of challenging cognitive distortions have impacted patient's conversations with patient's brother. Provided psycho education related to CBT and the connection between situations, thoughts, feelings, and behaviors. Reviewed patient's gratitude journal. Clinician requested patient continue gratitude journal and practice challenging cognitive distortions for homework.   Collaboration of Care: not required at this time   Diagnosis:  Major Depressive Disorder, recurrent, severe without psychotic features   Generalized anxiety disorder   Plan: Patient is to utilize [employer] Therapy, thought re-framing, relaxation techniques, mindfulness, effective communication, and coping  strategies to decrease symptoms associated with Major Depressive Disorder and Generalized Anxiety Disorder. Frequency: weekly  Modality: individual      Long-term goal:  Patient stated, "I want to see a change in the anxiety".      Reduce overall level, frequency, and intensity of the symptoms of anxiety and depression as evidenced by decreased sadness, feelings of anxiety, feeling "lost", lack of energy, difficulty falling and staying asleep, changes in concentration, psychomotor retardation, rapid heart rate, excessive worry, restlessness, feeling jittery, and feeling on edge from 5 to 6 days per week to 0 to 1 days per week per patient reported for at least 3 consecutive months.   Target Date: 02/11/24  Progress: progressing    Short-term goal:  Develop coping strategies to utilize in response to symptoms of depression/anxiety and stressors Patient stated, "I think I'm half way to that way" Target Date: 08/12/23  Progress: progressing    Verbally express patient's thoughts and feelings to others and utilize effective communication strategies when expressing patient's thoughts/feelings    Target Date: 08/12/23  Progress: progressing    Verbalize an understanding of the relationship between symptoms of anxiety and the impact on patient's thought patterns and behaviors    Target Date: 08/12/23  Progress: progressing     Doree Barthel, LCSW

## 2023-07-15 NOTE — Progress Notes (Signed)
   Doree Barthel, LCSW

## 2023-07-20 DIAGNOSIS — M25572 Pain in left ankle and joints of left foot: Secondary | ICD-10-CM | POA: Diagnosis not present

## 2023-07-25 ENCOUNTER — Other Ambulatory Visit: Payer: Self-pay | Admitting: Family Medicine

## 2023-07-25 ENCOUNTER — Encounter: Payer: Self-pay | Admitting: Family Medicine

## 2023-07-25 ENCOUNTER — Other Ambulatory Visit: Payer: Self-pay

## 2023-07-25 ENCOUNTER — Ambulatory Visit (INDEPENDENT_AMBULATORY_CARE_PROVIDER_SITE_OTHER): Admitting: Family Medicine

## 2023-07-25 ENCOUNTER — Telehealth: Payer: Self-pay

## 2023-07-25 VITALS — BP 120/67 | HR 73 | Temp 97.6°F | Ht 63.0 in | Wt 181.0 lb

## 2023-07-25 DIAGNOSIS — R569 Unspecified convulsions: Secondary | ICD-10-CM

## 2023-07-25 DIAGNOSIS — J0121 Acute recurrent ethmoidal sinusitis: Secondary | ICD-10-CM | POA: Insufficient documentation

## 2023-07-25 DIAGNOSIS — J302 Other seasonal allergic rhinitis: Secondary | ICD-10-CM | POA: Insufficient documentation

## 2023-07-25 DIAGNOSIS — N184 Chronic kidney disease, stage 4 (severe): Secondary | ICD-10-CM | POA: Diagnosis not present

## 2023-07-25 DIAGNOSIS — M109 Gout, unspecified: Secondary | ICD-10-CM | POA: Diagnosis not present

## 2023-07-25 DIAGNOSIS — H04129 Dry eye syndrome of unspecified lacrimal gland: Secondary | ICD-10-CM | POA: Diagnosis not present

## 2023-07-25 MED ORDER — HYPROMELLOSE (GONIOSCOPIC) 2.5 % OP SOLN: 1.0000 [drp] | Freq: Three times a day (TID) | OPHTHALMIC | 12 refills | Status: AC | PRN

## 2023-07-25 MED ORDER — HYPROMELLOSE (GONIOSCOPIC) 2.5 % OP SOLN: 1.0000 [drp] | Freq: Three times a day (TID) | OPHTHALMIC | 12 refills | Status: DC | PRN

## 2023-07-25 MED ORDER — CEFDINIR 300 MG PO CAPS: 300.0000 mg | ORAL_CAPSULE | Freq: Two times a day (BID) | ORAL | 0 refills | Status: DC

## 2023-07-25 MED ORDER — LEVOCETIRIZINE DIHYDROCHLORIDE 5 MG PO TABS: 5.0000 mg | ORAL_TABLET | ORAL | 0 refills | Status: DC

## 2023-07-25 NOTE — Patient Instructions (Signed)
 Consider nasal rinse machine.

## 2023-07-25 NOTE — Progress Notes (Signed)
 Established patient visit   Patient: Abigail Figueroa   DOB: Apr 04, 1961   63 y.o. Female  MRN: [id number] Visit Date: 07/25/2023  Today's healthcare provider: Sherlyn Hay, DO   Chief Complaint  Patient presents with   Sinus Problem    Congestion, running nose, headache this has been going on for about a month    Subjective    Sinus Problem Associated symptoms include congestion and sinus pressure. Pertinent negatives include no ear pain.   Abigail Figueroa is a 63 year old female with chronic sinus and allergy issues who presents with worsening allergy symptoms.  She is accompanied by her husband.  For the past month, she has experienced worsening allergy symptoms, including sinus congestion, drainage, and tenderness. These symptoms are part of a cycle she has been experiencing for the past ten years, involving both sinus and allergy issues. She reports sinus pressure, runny nose, postnasal drip, and itchy eyes, but no fever or chills. She experiences frequent sneezing and has not been using nasal rinses due to unfamiliarity with the process.  She is currently using a combination of azelastine and fluticasone nasal spray, and montelukast. Due to her kidney disease, she has been advised against using medications like Claritin, Zyrtec, Xyzal, and Allegra without medical guidance. She was diagnosed with kidney issues about a year ago, which has influenced her medication regimen.    She has a history of gout and Achilles tendon/heel issues, which have been managed with injections and wearing a boot. The gout has occurred twice, affecting her left great toe. She has been advised to have blood work done to monitor uric acid levels.  She is not currently experiencing a flare.     Medications: Outpatient Medications Prior to Visit  Medication Sig   amLODipine (NORVASC) 5 MG tablet TAKE 1.5 TABLET (7.5MG ) BY MOUTH DAILY.   ARIPiprazole (ABILIFY) 5 MG tablet Take 5 mg by mouth  daily.   Azelastine-Fluticasone (DYMISTA) 137-50 MCG/ACT SUSP Place 2 sprays into both nostrils in the morning and at bedtime.   BOTOX 100 UNITS SOLR injection Inject into the muscle every 3 (three) months.    busPIRone (BUSPAR) 15 MG tablet TAKE 1 TABLET BY MOUTH TWICE A DAY   chlorproMAZINE (THORAZINE) 25 MG tablet Take 50 mg by mouth 2 (two) times daily as needed.   chlorzoxazone (PARAFON) 500 MG tablet Take by mouth 4 (four) times daily as needed for muscle spasms.   clotrimazole-betamethasone (LOTRISONE) cream Apply 1 Application topically 2 (two) times daily.   EMGALITY 120 MG/ML SOAJ Inject into the skin as needed.   EPINEPHrine 0.3 mg/0.3 mL IJ SOAJ injection Inject into the muscle.   ezetimibe (ZETIA) 10 MG tablet TAKE 1 TABLET BY MOUTH EVERY DAY   fluocinolone (SYNALAR) 0.01 % external solution Apply topically as needed.   FLUoxetine (PROZAC) 10 MG capsule Take 40 mg by mouth daily.   fluticasone (FLONASE) 50 MCG/ACT nasal spray PLACE 2 SPRAYS INTO BOTH NOSTRILS IN THE MORNING AND AT BEDTIME.   furosemide (LASIX) 40 MG tablet Take 1 tablet (40 mg total) by mouth daily.   lamoTRIgine (LAMICTAL) 100 MG tablet Take 100 mg by mouth 2 (two) times daily.   Lancets (ONETOUCH ULTRASOFT) lancets Test fasting each morning and 2 hours before supper. Retest if having hypoglycemic symptoms. (Patient taking differently: 1 each by Other route as needed for other. Test fasting each morning and 2 hours before supper. Retest if having hypoglycemic symptoms.)  metoprolol tartrate (LOPRESSOR) 100 MG tablet TAKE 1 TABLET BY MOUTH TWICE A DAY   montelukast (SINGULAIR) 10 MG tablet TAKE 1 TABLET BY MOUTH EVERYDAY AT BEDTIME   ONETOUCH VERIO test strip TEST FASTING GLUCOSE DAILY AS DIRECTED   promethazine (PHENERGAN) 25 MG tablet Take 25 mg by mouth every 4 (four) hours as needed.   rosuvastatin (CRESTOR) 20 MG tablet TAKE 1 TABLET BY MOUTH EVERY DAY   Semaglutide,0.25 or 0.5MG /DOS, (OZEMPIC, 0.25 OR 0.5  MG/DOSE,) 2 MG/3ML SOPN Inject 0.25 mg into the skin once a week. (Patient taking differently: Inject 0.5 mg into the skin once a week.)   TEGRETOL-XR 400 MG 12 hr tablet TAKE 1 TABLET BY MOUTH TWICE A DAY   tretinoin (RETIN-A) 0.025 % cream Apply topically at bedtime.   triamcinolone ointment (KENALOG) 0.1 % Apply 1 Application topically 2 (two) times daily.   Azelastine HCl 0.15 % SOLN PLACE 2 SPRAYS INTO BOTH NOSTRILS 2 (TWO) TIMES DAILY (Patient taking differently: Place 2 sprays into both nostrils 2 (two) times daily. Taking as needed)   carbidopa-levodopa (SINEMET IR) 25-100 MG tablet Take 1 tablet by mouth Three (3) times a day. Start with 1/2 pill three times a day, after a week can increase to 1 pill three times a day.   No facility-administered medications prior to visit.    Review of Systems  Constitutional:  Negative for fever.  HENT:  Positive for congestion, postnasal drip, rhinorrhea and sinus pressure. Negative for ear pain, sinus pain and trouble swallowing.         Objective    BP 120/67   Pulse 73   Temp 97.6 F (36.4 C) (Oral)   Ht 5\' 3"  (1.6 m)   Wt 181 lb (82.1 kg)   LMP  (LMP Unknown)   SpO2 100%   BMI 32.06 kg/m     Physical Exam Vitals reviewed.  Constitutional:      General: She is not in acute distress.    Appearance: Normal appearance. She is well-developed. She is not diaphoretic.  HENT:     Head: Normocephalic and atraumatic.     Right Ear: External ear normal. There is no impacted cerumen.     Left Ear: External ear normal. There is no impacted cerumen.     Ears:     Comments: Cannot see past cerumen, which remains soft (not impacted).    Nose: Congestion and rhinorrhea present.     Right Turbinates: Enlarged and swollen.     Left Turbinates: Enlarged and swollen.     Mouth/Throat:     Mouth: Mucous membranes are moist.     Pharynx: Oropharynx is clear. No oropharyngeal exudate.  Eyes:     General: No scleral icterus.     Conjunctiva/sclera: Conjunctivae normal.     Pupils: Pupils are equal, round, and reactive to light.  Cardiovascular:     Rate and Rhythm: Normal rate and regular rhythm.  Musculoskeletal:     Cervical back: Neck supple.     Right lower leg: No edema.     Left lower leg: No edema.  Lymphadenopathy:     Cervical: No cervical adenopathy.  Skin:    General: Skin is warm and dry.     Findings: No rash.  Neurological:     Mental Status: She is alert.      No results found for any visits on 07/25/23.  Assessment & Plan    Seasonal allergies Assessment & Plan: Chronic allergic rhinitis with  sinus congestion and drainage, exacerbated during allergy season. Limited antihistamine options due to kidney disease. Recommended Xyzal twice weekly during allergy season. - Prescribe Xyzal (levocetirizine) twice weekly during allergy season. - Recommend nasal rinse machine for symptomatic relief. - Continue montelukast and nasal spray as needed, likely daily during symptomatic periods. - Prescribe lubricating eye drops for itchy eyes. - Prescribe cefdinir for suspected concomitant sinus infection, given recent worsening of month-long symptoms.   Orders: -     Levocetirizine Dihydrochloride; Take 1 tablet (5 mg total) by mouth 2 (two) times a week.  Dispense: 30 tablet; Refill: 0  Acute recurrent ethmoidal sinusitis Assessment & Plan: Addressed as noted above.   Orders: -     Cefdinir; Take 1 capsule (300 mg total) by mouth 2 (two) times daily.  Dispense: 10 capsule; Refill: 0  Dry eye -     Hypromellose (Gonioscopic); Place 1 drop into both eyes 3 (three) times daily as needed for dry eyes.  Dispense: 15 mL; Refill: 12  Gout involving toe of left foot, unspecified cause, unspecified chronicity Assessment & Plan: Gout affecting left toe, resolved recent flare-up. Check of uric acid levels recommended by another provider and requested by patient today. - Order uric acid level to monitor for  gout.   Orders: -     Uric acid  CKD (chronic kidney disease) stage 4, GFR 15-29 ml/min (HCC) Assessment & Plan: Chronic kidney disease limits medication options, requiring dosage adjustments. - Adjust medication dosages as needed based on kidney function.   History of Achilles Tendonitis Intermittent Achilles pain, improved with treatment. Further evaluation if symptoms persist. - Request records from [facility] for further evaluation. - Consider MRI if symptoms persist to assess for structural issues.  General Health Maintenance Regular care for chronic conditions including diabetes and kidney disease. - Ensure regular follow-ups with specialists, including nephrology and orthopedics.   Follow-up Follow-up plans for chronic conditions and recent issues discussed. - Follow up with PCP, Dr. Beryle Flock, in May for ongoing management. - Coordinate with nephrology for regular lab work and monitoring. - Ensure follow-up with orthopedics for Achilles tendonitis and gout management. Lab work is currently up-to-date and will be abstracted from Care Everywhere into local chart.  Return if symptoms worsen or fail to improve.      I discussed the assessment and treatment plan with the patient  The patient was provided an opportunity to ask questions and all were answered. The patient agreed with the plan and demonstrated an understanding of the instructions.   The patient was advised to call back or seek an in-person evaluation if the symptoms worsen or if the condition fails to improve as anticipated.    Sherlyn Hay, DO  [facility] [phone] (phone) [phone] (fax)  [facility]

## 2023-07-25 NOTE — Assessment & Plan Note (Addendum)
 Chronic allergic rhinitis with sinus congestion and drainage, exacerbated during allergy season. Limited antihistamine options due to kidney disease. Recommended Xyzal twice weekly during allergy season. - Prescribe Xyzal (levocetirizine) twice weekly during allergy season. - Recommend nasal rinse machine for symptomatic relief. - Continue montelukast and nasal spray as needed, likely daily during symptomatic periods. - Prescribe lubricating eye drops for itchy eyes. - Prescribe cefdinir for suspected concomitant sinus infection, given recent worsening of month-long symptoms.

## 2023-07-25 NOTE — Assessment & Plan Note (Signed)
 Chronic kidney disease limits medication options, requiring dosage adjustments. - Adjust medication dosages as needed based on kidney function.

## 2023-07-25 NOTE — Telephone Encounter (Signed)
 Pharmacy returned call to office and advised system pushed rx over to otc medication because it will not be filled as a prescription and insurance will not cover the cost. I verbalized understanding and requested for pharmacy to contact patient as the patient had already been by to pick up rx and was advised that provider had not sent it so they were waiting on a call back from the pharmacy. She verbalized understanding.   Pt contacted and advised just in case. She verbalized understanding

## 2023-07-25 NOTE — Progress Notes (Signed)
 Pharmacy reports rx not receved. Rx resent Pharmacist verbalizes never hearing of drops and it may be otc

## 2023-07-25 NOTE — Assessment & Plan Note (Signed)
 Addressed as noted above.

## 2023-07-25 NOTE — Assessment & Plan Note (Signed)
 Gout affecting left toe, resolved recent flare-up. Check of uric acid levels recommended by another provider and requested by patient today. - Order uric acid level to monitor for gout.

## 2023-07-25 NOTE — Telephone Encounter (Signed)
 Copied from CRM [id number]. Topic: Clinical - Prescription Issue >> Jul 25, 2023  2:16 PM Abundio Miu S wrote: Reason for CRM: Patient's spouse that the eye drops that were discussed during appointment was not sent to pharmacy. Requesting to have eye drops sent to pharmacy. >> Jul 25, 2023  3:52 PM CMA Sha'Taria T wrote: Pharmacy reports rx not received and that they have not heard of solution. Would you like to send new rx? Please advise

## 2023-07-26 ENCOUNTER — Encounter: Payer: Self-pay | Admitting: Family Medicine

## 2023-07-26 LAB — URIC ACID: Uric Acid: 4.9 mg/dL (ref 3.0–7.2)

## 2023-07-26 NOTE — Telephone Encounter (Signed)
 Requested medication (s) are due for refill today: yes  Requested medication (s) are on the active medication list: yes  Last refill:  02/08/23 #60   Future visit scheduled: no  Notes to clinic:  last OV was 07/25/23/overdue Tegretol level   Requested Prescriptions  Pending Prescriptions Disp Refills   TEGRETOL-XR 400 MG 12 hr tablet [Pharmacy Med Name: TEGRETOL XR 400 MG TABLET] 60 tablet 0    Sig: TAKE 1 TABLET BY MOUTH TWICE A DAY (NEED APPT PRIOR TO NEXT REFILL)     Neurology:  Anticonvulsants - carbamazepine Failed - 07/26/2023  4:20 PM      Failed - AST in normal range and within 360 days    AST  Date Value Ref Range Status  07/13/2022 218 (H) 15 - 41 U/L Final   SGOT(AST)  Date Value Ref Range Status  08/16/2014 54 (H) U/L Final    Comment:    15-41 NOTE: New Reference Range  07/09/14          Failed - ALT in normal range and within 360 days    ALT  Date Value Ref Range Status  07/13/2022 80 (H) 0 - 44 U/L Final   SGPT (ALT)  Date Value Ref Range Status  08/16/2014 49 U/L Final    Comment:    14-54 NOTE: New Reference Range  07/09/14          Failed - Carbamazepine (serum) in normal range and within 360 days    Carbamazepine, Total  Date Value Ref Range Status  08/09/2013 4.9 4.0 - 12.0 mcg/mL Final   Carbamazepine (Tegretol), S  Date Value Ref Range Status  12/16/2021 5.2 4.0 - 12.0 ug/mL Final    Comment:             In conjunction with other antiepileptic drugs                                Therapeutic  4.0 -  8.0                                Toxicity     9.0 - 12.0                                    Carbamazepine alone                                Therapeutic  8.0 - 12.0                                 Detection Limit =  2.0                           <2.0 indicates None Detected          Failed - HGB in normal range and within 360 days    Hemoglobin  Date Value Ref Range Status  07/01/2023 10.2 (L) 12.0 - 15.0 g/dL Final  54/01/8118  14.7 (L) 12.0 - 15.0 g/dL Final  82/95/6213 9.4 (L) 11.1 - 15.9 g/dL Final         Failed - Na in normal  range and within 360 days    Sodium  Date Value Ref Range Status  07/13/2022 139 135 - 145 mmol/L Final  06/15/2022 143 134 - 144 mmol/L Final  08/16/2014 144 mmol/L Final    Comment:    135-145 NOTE: New Reference Range  07/09/14          Failed - HCT in normal range and within 360 days    HCT  Date Value Ref Range Status  07/01/2023 31.9 (L) 36.0 - 46.0 % Final    Comment:    Performed at [facility], [address] [place], [place] [address]   Hematocrit  Date Value Ref Range Status  06/15/2022 29.1 (L) 34.0 - 46.6 % Final         Failed - Cr in normal range and within 360 days    Creat  Date Value Ref Range Status  02/28/2017 1.03 0.50 - 1.05 mg/dL Final    Comment:    For patients >72 years of age, the reference limit for Creatinine is approximately 13% higher for people identified as African-American. .    Creatinine, Ser  Date Value Ref Range Status  07/13/2022 2.24 (H) 0.44 - 1.00 mg/dL Final   Creatinine, POC  Date Value Ref Range Status  07/20/2016 NA mg/dL Final   Creatinine, Urine  Date Value Ref Range Status  12/14/2022 128.6  Final         Passed - WBC in normal range and within 360 days    WBC  Date Value Ref Range Status  09/21/2022 4.9 4.0 - 10.5 K/uL Final   WBC Count  Date Value Ref Range Status  05/03/2023 5.6 4.0 - 10.5 K/uL Final         Passed - PLT in normal range and within 360 days    Platelets  Date Value Ref Range Status  06/15/2022 255 150 - 450 x10E3/uL Final   Platelet Count  Date Value Ref Range Status  05/03/2023 197 150 - 400 K/uL Final         Passed - Completed PHQ-2 or PHQ-9 in the last 360 days      Passed - Valid encounter within last 12 months    Recent Outpatient Visits           8 months ago Nausea   [facility] [name], PA-C   9 months ago Tremor    [facility] [name], PA-C   9 months ago Tremor   [facility] [place], [place], PA-C   10 months ago Acute non-recurrent frontal sinusitis   [place] [facility] [name], PA-C   12 months ago CKD (chronic kidney disease) stage 4, GFR 15-29 ml/min [facility])   [place] [facility] [name], PA-C       Future Appointments             In 1 month Bacigalupo, Marzella Schlein, MD [facility]   In 5 months Dellis Anes, Hetty Ely, MD [facility]

## 2023-07-29 ENCOUNTER — Ambulatory Visit: Admitting: Clinical

## 2023-07-29 ENCOUNTER — Inpatient Hospital Stay: Payer: Medicare Other

## 2023-07-29 ENCOUNTER — Inpatient Hospital Stay: Payer: Medicare Other | Attending: Oncology

## 2023-07-29 VITALS — BP 118/61

## 2023-07-29 DIAGNOSIS — N184 Chronic kidney disease, stage 4 (severe): Secondary | ICD-10-CM | POA: Diagnosis not present

## 2023-07-29 DIAGNOSIS — D631 Anemia in chronic kidney disease: Secondary | ICD-10-CM | POA: Diagnosis not present

## 2023-07-29 LAB — HEMOGLOBIN AND HEMATOCRIT, BLOOD
HCT: 31 % — ABNORMAL LOW (ref 36.0–46.0)
Hemoglobin: 9.8 g/dL — ABNORMAL LOW (ref 12.0–15.0)

## 2023-07-29 MED ORDER — EPOETIN ALFA-EPBX 10000 UNIT/ML IJ SOLN
20000.0000 [IU] | Freq: Once | INTRAMUSCULAR | Status: AC
  Administered 2023-07-29: 20000 [IU] via SUBCUTANEOUS
  Filled 2023-07-29: qty 2

## 2023-08-11 DIAGNOSIS — M542 Cervicalgia: Secondary | ICD-10-CM | POA: Diagnosis not present

## 2023-08-11 DIAGNOSIS — G43719 Chronic migraine without aura, intractable, without status migrainosus: Secondary | ICD-10-CM | POA: Diagnosis not present

## 2023-08-11 DIAGNOSIS — M791 Myalgia, unspecified site: Secondary | ICD-10-CM | POA: Diagnosis not present

## 2023-08-12 ENCOUNTER — Ambulatory Visit: Admitting: Clinical

## 2023-08-12 DIAGNOSIS — F411 Generalized anxiety disorder: Secondary | ICD-10-CM | POA: Diagnosis not present

## 2023-08-12 DIAGNOSIS — F330 Major depressive disorder, recurrent, mild: Secondary | ICD-10-CM

## 2023-08-12 NOTE — Progress Notes (Signed)
   Abigail Barthel, LCSW
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
## 2023-08-22 DIAGNOSIS — H5213 Myopia, bilateral: Secondary | ICD-10-CM | POA: Diagnosis not present

## 2023-08-30 ENCOUNTER — Inpatient Hospital Stay: Payer: Medicare Other

## 2023-08-30 ENCOUNTER — Inpatient Hospital Stay: Attending: Oncology

## 2023-08-30 DIAGNOSIS — D631 Anemia in chronic kidney disease: Secondary | ICD-10-CM | POA: Insufficient documentation

## 2023-08-30 DIAGNOSIS — N184 Chronic kidney disease, stage 4 (severe): Secondary | ICD-10-CM | POA: Diagnosis not present

## 2023-08-30 LAB — CBC WITH DIFFERENTIAL (CANCER CENTER ONLY)
Abs Immature Granulocytes: 0.05 10*3/uL (ref 0.00–0.07)
Basophils Absolute: 0.1 10*3/uL (ref 0.0–0.1)
Basophils Relative: 1 %
Eosinophils Absolute: 0.2 10*3/uL (ref 0.0–0.5)
Eosinophils Relative: 5 %
HCT: 31.4 % — ABNORMAL LOW (ref 36.0–46.0)
Hemoglobin: 9.8 g/dL — ABNORMAL LOW (ref 12.0–15.0)
Immature Granulocytes: 1 %
Lymphocytes Relative: 40 %
Lymphs Abs: 1.8 10*3/uL (ref 0.7–4.0)
MCH: 28.9 pg (ref 26.0–34.0)
MCHC: 31.2 g/dL (ref 30.0–36.0)
MCV: 92.6 fL (ref 80.0–100.0)
Monocytes Absolute: 0.3 10*3/uL (ref 0.1–1.0)
Monocytes Relative: 7 %
Neutro Abs: 2.1 10*3/uL (ref 1.7–7.7)
Neutrophils Relative %: 46 %
Platelet Count: 170 10*3/uL (ref 150–400)
RBC: 3.39 MIL/uL — ABNORMAL LOW (ref 3.87–5.11)
RDW: 14.8 % (ref 11.5–15.5)
WBC Count: 4.5 10*3/uL (ref 4.0–10.5)
nRBC: 0 % (ref 0.0–0.2)

## 2023-08-30 LAB — IRON AND TIBC
Iron: 117 ug/dL (ref 28–170)
Saturation Ratios: 41 % — ABNORMAL HIGH (ref 10.4–31.8)
TIBC: 288 ug/dL (ref 250–450)
UIBC: 171 ug/dL

## 2023-08-30 LAB — RETIC PANEL
Immature Retic Fract: 3.8 % (ref 2.3–15.9)
RBC.: 3.43 MIL/uL — ABNORMAL LOW (ref 3.87–5.11)
Retic Count, Absolute: 22.3 10*3/uL (ref 19.0–186.0)
Retic Ct Pct: 0.7 % (ref 0.4–3.1)
Reticulocyte Hemoglobin: 32.5 pg (ref >27.9)

## 2023-08-30 LAB — FERRITIN: Ferritin: 169 ng/mL (ref 11–307)

## 2023-08-31 ENCOUNTER — Other Ambulatory Visit: Payer: Self-pay | Admitting: Family Medicine

## 2023-08-31 DIAGNOSIS — R569 Unspecified convulsions: Secondary | ICD-10-CM

## 2023-09-01 ENCOUNTER — Encounter: Payer: Self-pay | Admitting: Oncology

## 2023-09-02 ENCOUNTER — Encounter: Payer: Self-pay | Admitting: Oncology

## 2023-09-02 ENCOUNTER — Inpatient Hospital Stay: Payer: Medicare Other

## 2023-09-02 ENCOUNTER — Inpatient Hospital Stay: Payer: Medicare Other | Attending: Oncology | Admitting: Oncology

## 2023-09-02 VITALS — BP 130/73 | HR 67 | Temp 96.3°F | Resp 18 | Wt 183.1 lb

## 2023-09-02 DIAGNOSIS — D631 Anemia in chronic kidney disease: Secondary | ICD-10-CM | POA: Insufficient documentation

## 2023-09-02 DIAGNOSIS — N184 Chronic kidney disease, stage 4 (severe): Secondary | ICD-10-CM | POA: Diagnosis not present

## 2023-09-02 MED ORDER — EPOETIN ALFA-EPBX 20000 UNIT/ML IJ SOLN
20000.0000 [IU] | Freq: Once | INTRAMUSCULAR | Status: AC
  Administered 2023-09-02: 20000 [IU] via SUBCUTANEOUS
  Filled 2023-09-02: qty 1

## 2023-09-02 NOTE — Assessment & Plan Note (Addendum)
 Status post IV Venofer  treatments.  She tolerated well. Labs are reviewed and discussed with patient. Lab Results  Component Value Date   HGB 9.8 (L) 08/30/2023   TIBC 288 08/30/2023   IRONPCTSAT 41 (H) 08/30/2023   FERRITIN 169 08/30/2023   Ferritin <200, recommend 1 dose of Venofer  200 mg She will get retacrit  20,000 units today Retacrit  20,000 units every 4 weeks

## 2023-09-02 NOTE — Progress Notes (Signed)
 Hematology/Oncology Progress note [phone] [phone]       REFERRING PROVIDER: Mazie Speed, MD  CHIEF COMPLAINTS/REASON FOR VISIT:  Anemia due to chronic kidney disease  ASSESSMENT & PLAN:   Anemia in stage 4 chronic kidney disease (HCC) Status post IV Venofer  treatments.  She tolerated well. Labs are reviewed and discussed with patient. Lab Results  Component Value Date   HGB 9.8 (L) 08/30/2023   TIBC 288 08/30/2023   IRONPCTSAT 41 (H) 08/30/2023   FERRITIN 169 08/30/2023   Ferritin <200, recommend 1 dose of Venofer  200 mg She will get retacrit  20,000 units today Retacrit  20,000 units every 4 weeks  CKD (chronic kidney disease) stage 4, GFR 15-29 ml/min (HCC) Encourage oral hydration and avoid nephrotoxins.    Orders Placed This Encounter  Procedures   CBC with Differential (Cancer Center Only)    Standing Status:   Future    Expected Date:   01/03/2024    Expiration Date:   09/01/2024   Iron  and TIBC    Standing Status:   Future    Expected Date:   01/03/2024    Expiration Date:   09/01/2024   Ferritin    Standing Status:   Future    Expected Date:   01/03/2024    Expiration Date:   09/01/2024   Retic Panel    Standing Status:   Future    Expected Date:   01/03/2024    Expiration Date:   09/01/2024   Hemoglobin and Hematocrit (Cancer Center Only)    Standing Status:   Standing    Number of Occurrences:   3    Expiration Date:   09/01/2024   Follow-up  H&H lab in 4w, 8w, 12w +/- retacrit  Follow up in 4 months, labs prior to MD +/- retacrit  CBC, Iron , TIBC Ferritin retic panel   All questions were answered. The patient knows to call the clinic with any problems, questions or concerns.  Timmy Forbes, MD, PhD [facility] Hematology Oncology 09/02/2023     HISTORY OF PRESENTING ILLNESS:  Abigail Figueroa is a  63 y.o.  female with PMH listed below who was referred to me for anemia Reviewed patient's recent labs that was done.  Patient  has progressive worsening of anemia. 07/13/2022, hemoglobin is 8.2, MCV 88.9, normal white count and platelet counts. Patient reports severe fatigue, mild shortness of breath with exertion. She denies recent chest pain on exertion,  , pre-syncopal episodes, or palpitations She had not noticed any recent bleeding such as epistaxis, hematuria.  Patient has intermittent rectal bleeding.  Constipation.  She has upcoming GI appointment for further evaluation.   Patient has chronic kidney disease, stage IV.  She follows up with nephrology.  She has had negative protein electrophoresis.  Light chain ratio was mildly increased which is nonspecific.Aaron Aas  She is on aspirin  81 mg.   INTERVAL HISTORY Abigail Figueroa is a 63 y.o. female who has above history reviewed by me today presents for follow up visit for anemia due to CKD.  Chronic fatigue unchanged. She feels well, no new complaints.   MEDICAL HISTORY:  Past Medical History:  Diagnosis Date   Abnormal stress test    a. 10/2009: Normal myocardial perfusion imaging; b. 08/2015 MV: medium defect of mod severity in mid ant apical region w/ mild HK of the distal inf wall;  c. 08/2015 Cath: nl Cors, EF 60%.   Anemia    Angio-edema    Anxiety  Arthritis    Bell's palsy    Depression    Dermatitis 09/10/2014   Edema    Essential hypertension    Frequent urination    Frequent urination at night    Headache    migraines, gets botox injections   Heart palpitations 10/2011   Event monitor showing sinus tachycardia, PACs with couplets and triplets.   Hx of echocardiogram    a. 10/2009 Echo: showed normal left ventricular function, mild left ventricular hypertrophy, and no significant valve abnormalities.   Hypercholesterolemia    Migraine    Morbid obesity (HCC)    Neuromuscular disorder (HCC)    OSA (obstructive sleep apnea)    has been on continuous positive airway pressure   Seizure disorder (HCC)    Seizures (HCC)    Type II diabetes  mellitus (HCC)    Urticaria     SURGICAL HISTORY: Past Surgical History:  Procedure Laterality Date   ABDOMINAL HYSTERECTOMY  1990   without BSO   BACK SURGERY  1900s 2001   x2 with "cage put in"   BILATERAL SALPINGOOPHORECTOMY  1990   CARDIAC CATHETERIZATION N/A 08/06/2015   Procedure: Left Heart Cath and Coronary Angiography;  Surgeon: Millicent Ally, MD;  Location: [facility];  Service: Cardiovascular;  Laterality: N/A;   CARPAL TUNNEL RELEASE Bilateral    CHOLECYSTECTOMY  1980   COLONOSCOPY WITH PROPOFOL  N/A 08/30/2017   Procedure: COLONOSCOPY WITH PROPOFOL ;  Surgeon: Selena Daily, MD;  Location: [facility];  Service: Endoscopy;  Laterality: N/A;   DG THUMB LEFT HAND  01/24/2018   repair joint in left thumb   DORSAL COMPARTMENT RELEASE Left 10/25/2014   Procedure: LEFT FIRST  DORSAL COMPARTMENT RELEASE AND RADIAL TENOSYNOVECTOMY ;  Surgeon: Ronn Cohn, MD;  Location: [facility];  Service: Orthopedics;  Laterality: Left;   HAND SURGERY     KNEE SURGERY Right 2012   meniscus tear   KNEE SURGERY  2012   LAMINECTOMY  1995   TUBAL LIGATION  1982   WRIST SURGERY Right     SOCIAL HISTORY: Social History   Socioeconomic History   Marital status: Married    Spouse name: Not on file   Number of children: 2   Years of education: Not on file   Highest education level: 12th grade  Occupational History   Occupation: disabled  Tobacco Use   Smoking status: Former    Current packs/day: 0.00    Types: Cigarettes    Quit date: 01/03/1983    Years since quitting: 40.6   Smokeless tobacco: Never   Tobacco comments:    quit in 1984  Vaping Use   Vaping status: Never Used  Substance and Sexual Activity   Alcohol use: No    Alcohol/week: 0.0 standard drinks of alcohol   Drug use: No   Sexual activity: Never  Other Topics Concern   Not on file  Social History Narrative   ** Merged History Encounter ** She is a married mother of 2, grandmother  3. She tries to get exercise but is not doing any routine program.She quit smoking over 25 years ago does not drink alcohol.   No caffiene since 03/2022.    Social Drivers of Health   Financial Resource Strain: Low Risk  (12/06/2022)   Overall Financial Resource Strain (CARDIA)    Difficulty of Paying Living Expenses: Not very hard  Recent Concern: Financial Resource Strain - Medium Risk (11/09/2022)   Overall Financial Resource Strain (  CARDIA)    Difficulty of Paying Living Expenses: Somewhat hard  Food Insecurity: Food Insecurity Present (12/06/2022)   Hunger Vital Sign    Worried About Running Out of Food in the Last Year: Sometimes true    Ran Out of Food in the Last Year: Sometimes true  Transportation Needs: No Transportation Needs (12/06/2022)   PRAPARE - Administrator, Civil Service (Medical): No    Lack of Transportation (Non-Medical): No  Physical Activity: Sufficiently Active (12/06/2022)   Exercise Vital Sign    Days of Exercise per Week: 5 days    Minutes of Exercise per Session: 40 min  Stress: Stress Concern Present (12/06/2022)   [employer] of Occupational Health - Occupational Stress Questionnaire    Feeling of Stress : To some extent  Social Connections: Socially Integrated (12/06/2022)   Social Connection and Isolation Panel [NHANES]    Frequency of Communication with Friends and Family: More than three times a week    Frequency of Social Gatherings with Friends and Family: Twice a week    Attends Religious Services: More than 4 times per year    Active Member of [employer] or Organizations: Yes    Attends Engineer, structural: More than 4 times per year    Marital Status: Married  Catering manager Violence: Not At Risk (12/06/2022)   Humiliation, Afraid, Rape, and Kick questionnaire    Fear of Current or Ex-Partner: No    Emotionally Abused: No    Physically Abused: No    Sexually Abused: No    FAMILY HISTORY: Family History  Problem Relation Age  of Onset   Sarcoidosis Mother    Seizures Mother    Heart attack Mother    Alzheimer's disease Maternal Grandmother    Dementia Maternal Grandmother    Hypertension Maternal Grandfather    Diabetes Maternal Grandfather    Breast cancer Neg Hx    Allergic rhinitis Neg Hx    Asthma Neg Hx    Eczema Neg Hx    Urticaria Neg Hx     ALLERGIES:  is allergic to morphine, morphine and codeine, oxycodone , oxycodone  hcl, aimovig [erenumab-aooe], augmentin  [amoxicillin -pot clavulanate], and morphine sulfate.  MEDICATIONS:  Current Outpatient Medications  Medication Sig Dispense Refill   amLODipine  (NORVASC ) 5 MG tablet TAKE 1.5 TABLET (7.5MG ) BY MOUTH DAILY. 135 tablet 3   ARIPiprazole (ABILIFY) 10 MG tablet SMARTSIG:1 Tablet(s) By Mouth Every Evening     Azelastine  HCl 0.15 % SOLN PLACE 2 SPRAYS INTO BOTH NOSTRILS 2 (TWO) TIMES DAILY (Patient taking differently: Place 2 sprays into both nostrils 2 (two) times daily. Taking as needed) 30 mL 11   Azelastine -Fluticasone  (DYMISTA ) 137-50 MCG/ACT SUSP Place 2 sprays into both nostrils in the morning and at bedtime. 23 g 5   BOTOX 100 UNITS SOLR injection Inject into the muscle every 3 (three) months.      busPIRone  (BUSPAR ) 15 MG tablet TAKE 1 TABLET BY MOUTH TWICE A DAY 180 tablet 1   carbidopa-levodopa (SINEMET IR) 25-100 MG tablet Take 1 tablet by mouth Three (3) times a day. Start with 1/2 pill three times a day, after a week can increase to 1 pill three times a day.     cefdinir  (OMNICEF ) 300 MG capsule Take 1 capsule (300 mg total) by mouth 2 (two) times daily. 10 capsule 0   chlorproMAZINE  (THORAZINE ) 25 MG tablet Take 50 mg by mouth 2 (two) times daily as needed.     chlorzoxazone (PARAFON) 500  MG tablet Take by mouth 4 (four) times daily as needed for muscle spasms.     clotrimazole -betamethasone  (LOTRISONE ) cream Apply 1 Application topically 2 (two) times daily. 45 g 3   EMGALITY 120 MG/ML SOAJ Inject into the skin as needed.      EPINEPHrine  0.3 mg/0.3 mL IJ SOAJ injection Inject into the muscle.     ezetimibe  (ZETIA ) 10 MG tablet TAKE 1 TABLET BY MOUTH EVERY DAY 90 tablet 2   fluocinolone (SYNALAR) 0.01 % external solution Apply topically as needed.     FLUoxetine (PROZAC) 10 MG capsule Take 40 mg by mouth daily.     fluticasone  (FLONASE ) 50 MCG/ACT nasal spray PLACE 2 SPRAYS INTO BOTH NOSTRILS IN THE MORNING AND AT BEDTIME. 48 mL 1   furosemide  (LASIX ) 40 MG tablet Take 1 tablet (40 mg total) by mouth daily. 90 tablet 3   hydroxypropyl methylcellulose / hypromellose (ISOPTO TEARS / GONIOVISC) 2.5 % ophthalmic solution Place 1 drop into both eyes 3 (three) times daily as needed for dry eyes. 15 mL 12   lamoTRIgine  (LAMICTAL ) 100 MG tablet Take 100 mg by mouth 2 (two) times daily.     Lancets (ONETOUCH ULTRASOFT) lancets Test fasting each morning and 2 hours before supper. Retest if having hypoglycemic symptoms. (Patient taking differently: 1 each by Other route as needed for other. Test fasting each morning and 2 hours before supper. Retest if having hypoglycemic symptoms.) 100 each 12   levocetirizine (XYZAL ) 5 MG tablet Take 1 tablet (5 mg total) by mouth 2 (two) times a week. 30 tablet 0   metoprolol  tartrate (LOPRESSOR ) 100 MG tablet TAKE 1 TABLET BY MOUTH TWICE A DAY 180 tablet 3   montelukast  (SINGULAIR ) 10 MG tablet TAKE 1 TABLET BY MOUTH EVERYDAY AT BEDTIME 90 tablet 2   ONETOUCH VERIO test strip TEST FASTING GLUCOSE DAILY AS DIRECTED 100 strip 3   promethazine  (PHENERGAN ) 25 MG tablet Take 25 mg by mouth every 4 (four) hours as needed.     rosuvastatin  (CRESTOR ) 20 MG tablet TAKE 1 TABLET BY MOUTH EVERY DAY 90 tablet 1   Semaglutide ,0.25 or 0.5MG /DOS, (OZEMPIC , 0.25 OR 0.5 MG/DOSE,) 2 MG/3ML SOPN Inject 0.25 mg into the skin once a week. (Patient taking differently: Inject 0.5 mg into the skin once a week.) 3 mL 0   TEGRETOL -XR 400 MG 12 hr tablet TAKE 1 TABLET BY MOUTH TWICE A DAY (NEED APPT PRIOR TO NEXT REFILL)  60 tablet 0   tretinoin (RETIN-A) 0.025 % cream Apply topically at bedtime.     triamcinolone  ointment (KENALOG ) 0.1 % Apply 1 Application topically 2 (two) times daily.     ARIPiprazole (ABILIFY) 5 MG tablet Take 5 mg by mouth daily. (Patient not taking: Reported on 09/02/2023)     No current facility-administered medications for this visit.    Review of Systems  Constitutional:  Positive for fatigue. Negative for appetite change, chills and fever.  HENT:   Negative for hearing loss and voice change.   Eyes:  Negative for eye problems.  Respiratory:  Negative for chest tightness and cough.   Cardiovascular:  Negative for chest pain.  Gastrointestinal:  Negative for abdominal distention, abdominal pain and blood in stool.  Endocrine: Negative for hot flashes.  Genitourinary:  Negative for difficulty urinating and frequency.   Musculoskeletal:  Negative for arthralgias.  Skin:  Negative for itching and rash.  Neurological:  Negative for extremity weakness.  Hematological:  Negative for adenopathy.  Psychiatric/Behavioral:  Negative for  confusion.     PHYSICAL EXAMINATION: ECOG PERFORMANCE STATUS: 1 - Symptomatic but completely ambulatory Vitals:   09/02/23 1217  BP: 130/73  Pulse: 67  Resp: 18  Temp: (!) 96.3 F (35.7 C)  SpO2: 100%   Filed Weights   09/02/23 1217  Weight: 183 lb 1.6 oz (83.1 kg)    Physical Exam Constitutional:      General: She is not in acute distress. HENT:     Head: Normocephalic and atraumatic.  Eyes:     General: No scleral icterus. Cardiovascular:     Rate and Rhythm: Normal rate and regular rhythm.     Heart sounds: Normal heart sounds.  Pulmonary:     Effort: Pulmonary effort is normal. No respiratory distress.     Breath sounds: No wheezing.  Abdominal:     General: Bowel sounds are normal. There is no distension.     Palpations: Abdomen is soft.  Musculoskeletal:        General: No deformity. Normal range of motion.     Cervical  back: Normal range of motion and neck supple.  Skin:    General: Skin is warm and dry.     Findings: No erythema or rash.  Neurological:     Mental Status: She is alert and oriented to person, place, and time. Mental status is at baseline.     Cranial Nerves: No cranial nerve deficit.     Coordination: Coordination normal.  Psychiatric:        Mood and Affect: Mood normal.      LABORATORY DATA:  I have reviewed the data as listed    Latest Ref Rng & Units 08/30/2023    1:25 PM 07/29/2023   11:31 AM 07/01/2023   11:36 AM  CBC  WBC 4.0 - 10.5 K/uL 4.5     Hemoglobin 12.0 - 15.0 g/dL 9.8  9.8  16.1   Hematocrit 36.0 - 46.0 % 31.4  31.0  31.9   Platelets 150 - 400 K/uL 170         Latest Ref Rng & Units 07/13/2022   12:29 AM 06/15/2022    2:38 PM 04/12/2022   12:18 PM  CMP  Glucose 70 - 99 mg/dL 096  045  409   BUN 8 - 23 mg/dL 40  27  24   Creatinine 0.44 - 1.00 mg/dL 8.11  9.14  7.82   Sodium 135 - 145 mmol/L 139  143  140   Potassium 3.5 - 5.1 mmol/L 3.5  4.4  4.2   Chloride 98 - 111 mmol/L 104  105  104   CO2 22 - 32 mmol/L 26  22  22    Calcium  8.9 - 10.3 mg/dL 9.0  95.6  21.3   Total Protein 6.5 - 8.1 g/dL 7.3  7.5  7.8   Total Bilirubin 0.3 - 1.2 mg/dL 0.4  <0.8    Alkaline Phos 38 - 126 U/L 86  92    AST 15 - 41 U/L 218  29    ALT 0 - 44 U/L 80  34        Component Value Date/Time   IRON  117 08/30/2023 1325   TIBC 288 08/30/2023 1325   FERRITIN 169 08/30/2023 1325   IRONPCTSAT 41 (H) 08/30/2023 1325     RADIOGRAPHIC STUDIES: I have personally reviewed the radiological images as listed and agreed with the findings in the report. No results found.

## 2023-09-02 NOTE — Assessment & Plan Note (Signed)
 Encourage oral hydration and avoid nephrotoxins.

## 2023-09-05 ENCOUNTER — Encounter: Payer: Self-pay | Admitting: Oncology

## 2023-09-08 ENCOUNTER — Encounter: Payer: Self-pay | Admitting: Family Medicine

## 2023-09-08 ENCOUNTER — Ambulatory Visit: Payer: Medicare Other | Admitting: Family Medicine

## 2023-09-08 VITALS — BP 117/71 | HR 68 | Ht 63.0 in | Wt 180.6 lb

## 2023-09-08 DIAGNOSIS — Z23 Encounter for immunization: Secondary | ICD-10-CM

## 2023-09-08 DIAGNOSIS — D631 Anemia in chronic kidney disease: Secondary | ICD-10-CM | POA: Diagnosis not present

## 2023-09-08 DIAGNOSIS — E669 Obesity, unspecified: Secondary | ICD-10-CM

## 2023-09-08 DIAGNOSIS — N184 Chronic kidney disease, stage 4 (severe): Secondary | ICD-10-CM

## 2023-09-08 DIAGNOSIS — Z0001 Encounter for general adult medical examination with abnormal findings: Secondary | ICD-10-CM | POA: Diagnosis not present

## 2023-09-08 DIAGNOSIS — Z0000 Encounter for general adult medical examination without abnormal findings: Secondary | ICD-10-CM

## 2023-09-08 DIAGNOSIS — Z1231 Encounter for screening mammogram for malignant neoplasm of breast: Secondary | ICD-10-CM

## 2023-09-08 DIAGNOSIS — I152 Hypertension secondary to endocrine disorders: Secondary | ICD-10-CM

## 2023-09-08 DIAGNOSIS — G20A2 Parkinson's disease without dyskinesia, with fluctuations: Secondary | ICD-10-CM

## 2023-09-08 DIAGNOSIS — E1169 Type 2 diabetes mellitus with other specified complication: Secondary | ICD-10-CM | POA: Diagnosis not present

## 2023-09-08 DIAGNOSIS — E785 Hyperlipidemia, unspecified: Secondary | ICD-10-CM

## 2023-09-08 DIAGNOSIS — E1159 Type 2 diabetes mellitus with other circulatory complications: Secondary | ICD-10-CM | POA: Diagnosis not present

## 2023-09-08 DIAGNOSIS — E1122 Type 2 diabetes mellitus with diabetic chronic kidney disease: Secondary | ICD-10-CM

## 2023-09-08 NOTE — Assessment & Plan Note (Signed)
 Previously well controlled Continue statin Repeat FLP and CMP Goal LDL < 70

## 2023-09-08 NOTE — Progress Notes (Signed)
 Complete physical exam   Patient: Abigail Figueroa   DOB: 1960-11-11   63 y.o. Female  MRN: [id number] Visit Date: 09/08/2023  Today's healthcare provider: Aden Agreste, MD   Chief Complaint  Patient presents with   Annual Exam    Diet -  low sodium Exercise - daily for 45 minutes to one hour Feeling - well Sleeping - well Concerns - none    Subjective    Abigail Figueroa is a 63 y.o. female who presents today for a complete physical exam.     Last depression screening scores    09/08/2023    9:35 AM 07/25/2023    9:30 AM 12/06/2022   11:47 AM  PHQ 2/9 Scores  PHQ - 2 Score 2 2 0  PHQ- 9 Score 5 6    Last fall risk screening    12/06/2022   11:42 AM  Fall Risk   Falls in the past year? 1  Number falls in past yr: 0  Injury with Fall? 0  Risk for fall due to : No Fall Risks  Follow up Education provided;Falls prevention discussed        Medications: Outpatient Medications Prior to Visit  Medication Sig   amLODipine  (NORVASC ) 5 MG tablet TAKE 1.5 TABLET (7.5MG ) BY MOUTH DAILY.   ARIPiprazole (ABILIFY) 10 MG tablet SMARTSIG:1 Tablet(s) By Mouth Every Evening   Azelastine -Fluticasone  (DYMISTA ) 137-50 MCG/ACT SUSP Place 2 sprays into both nostrils in the morning and at bedtime.   BOTOX 100 UNITS SOLR injection Inject into the muscle every 3 (three) months.    busPIRone  (BUSPAR ) 15 MG tablet TAKE 1 TABLET BY MOUTH TWICE A DAY   chlorproMAZINE  (THORAZINE ) 25 MG tablet Take 50 mg by mouth 2 (two) times daily as needed.   chlorzoxazone (PARAFON) 500 MG tablet Take by mouth 4 (four) times daily as needed for muscle spasms.   clotrimazole -betamethasone  (LOTRISONE ) cream Apply 1 Application topically 2 (two) times daily.   EMGALITY 120 MG/ML SOAJ Inject into the skin as needed.   EPINEPHrine  0.3 mg/0.3 mL IJ SOAJ injection Inject into the muscle.   ezetimibe  (ZETIA ) 10 MG tablet TAKE 1 TABLET BY MOUTH EVERY DAY   fluocinolone (SYNALAR) 0.01 % external solution  Apply topically as needed.   FLUoxetine (PROZAC) 10 MG capsule Take 40 mg by mouth daily.   fluticasone  (FLONASE ) 50 MCG/ACT nasal spray PLACE 2 SPRAYS INTO BOTH NOSTRILS IN THE MORNING AND AT BEDTIME.   furosemide  (LASIX ) 40 MG tablet Take 1 tablet (40 mg total) by mouth daily.   hydroxypropyl methylcellulose / hypromellose (ISOPTO TEARS / GONIOVISC) 2.5 % ophthalmic solution Place 1 drop into both eyes 3 (three) times daily as needed for dry eyes.   lamoTRIgine  (LAMICTAL ) 100 MG tablet Take 100 mg by mouth 2 (two) times daily.   Lancets (ONETOUCH ULTRASOFT) lancets Test fasting each morning and 2 hours before supper. Retest if having hypoglycemic symptoms. (Patient taking differently: 1 each by Other route as needed for other. Test fasting each morning and 2 hours before supper. Retest if having hypoglycemic symptoms.)   levocetirizine (XYZAL ) 5 MG tablet Take 1 tablet (5 mg total) by mouth 2 (two) times a week.   metoprolol  tartrate (LOPRESSOR ) 100 MG tablet TAKE 1 TABLET BY MOUTH TWICE A DAY   montelukast  (SINGULAIR ) 10 MG tablet TAKE 1 TABLET BY MOUTH EVERYDAY AT BEDTIME   ONETOUCH VERIO test strip TEST FASTING GLUCOSE DAILY AS DIRECTED   promethazine  (PHENERGAN ) 25 MG  tablet Take 25 mg by mouth every 4 (four) hours as needed.   rosuvastatin  (CRESTOR ) 20 MG tablet TAKE 1 TABLET BY MOUTH EVERY DAY   Semaglutide ,0.25 or 0.5MG /DOS, (OZEMPIC , 0.25 OR 0.5 MG/DOSE,) 2 MG/3ML SOPN Inject 0.25 mg into the skin once a week. (Patient taking differently: Inject 0.5 mg into the skin once a week.)   TEGRETOL -XR 400 MG 12 hr tablet TAKE 1 TABLET BY MOUTH TWICE A DAY (NEED APPT PRIOR TO NEXT REFILL)   tretinoin (RETIN-A) 0.025 % cream Apply topically at bedtime.   triamcinolone  ointment (KENALOG ) 0.1 % Apply 1 Application topically 2 (two) times daily.   Azelastine  HCl 0.15 % SOLN PLACE 2 SPRAYS INTO BOTH NOSTRILS 2 (TWO) TIMES DAILY (Patient taking differently: Place 2 sprays into both nostrils 2 (two) times  daily. Taking as needed)   carbidopa-levodopa (SINEMET IR) 25-100 MG tablet Take 1 tablet by mouth Three (3) times a day. Start with 1/2 pill three times a day, after a week can increase to 1 pill three times a day.   [DISCONTINUED] ARIPiprazole (ABILIFY) 5 MG tablet Take 5 mg by mouth daily. (Patient not taking: Reported on 09/02/2023)   [DISCONTINUED] cefdinir  (OMNICEF ) 300 MG capsule Take 1 capsule (300 mg total) by mouth 2 (two) times daily.   No facility-administered medications prior to visit.    Review of Systems    Objective    BP 117/71   Pulse 68   Ht 5\' 3"  (1.6 m)   Wt 180 lb 9.6 oz (81.9 kg)   LMP  (LMP Unknown)   SpO2 100%   BMI 31.99 kg/m    Physical Exam Vitals reviewed.  Constitutional:      General: She is not in acute distress.    Appearance: Normal appearance. She is well-developed. She is not diaphoretic.  HENT:     Head: Normocephalic and atraumatic.     Right Ear: Tympanic membrane, ear canal and external ear normal.     Left Ear: Tympanic membrane, ear canal and external ear normal.     Nose: Nose normal.     Mouth/Throat:     Mouth: Mucous membranes are moist.     Pharynx: Oropharynx is clear. No oropharyngeal exudate.  Eyes:     General: No scleral icterus.    Conjunctiva/sclera: Conjunctivae normal.     Pupils: Pupils are equal, round, and reactive to light.  Neck:     Thyroid : No thyromegaly.  Cardiovascular:     Rate and Rhythm: Normal rate and regular rhythm.     Heart sounds: Normal heart sounds. No murmur heard. Pulmonary:     Effort: Pulmonary effort is normal. No respiratory distress.     Breath sounds: Normal breath sounds. No wheezing or rales.  Abdominal:     General: There is no distension.     Palpations: Abdomen is soft.     Tenderness: There is no abdominal tenderness.  Musculoskeletal:        General: No deformity.     Cervical back: Neck supple.     Right lower leg: No edema.     Left lower leg: No edema.   Lymphadenopathy:     Cervical: No cervical adenopathy.  Skin:    General: Skin is warm and dry.     Findings: No rash.  Neurological:     Mental Status: She is alert and oriented to person, place, and time. Mental status is at baseline.     Gait: Gait normal.  Psychiatric:  Mood and Affect: Mood normal.        Behavior: Behavior normal.        Thought Content: Thought content normal.      No results found for any visits on 09/08/23.  Assessment & Plan    Routine Health Maintenance and Physical Exam  Exercise Activities and Dietary recommendations  Goals      DIET - EAT MORE FRUITS AND VEGETABLES     Increase water  intake     Recommend to continue drinking 40 oz of water  a day.     LIFESTYLE - DECREASE FALLS RISK     Recommend to remove any items from the home that may cause slips or trips.        Immunization History  Administered Date(s) Administered   Fluad Quad(high Dose 65+) 07/29/2022   Influenza, Mdck, Trivalent,PF 6+ MOS(egg free) 03/23/2023   Influenza,inj,Quad PF,6+ Mos 02/28/2017, 01/17/2018, 02/06/2019, 02/05/2020   Moderna Covid-19 Fall Seasonal Vaccine 36yrs & older 03/23/2023   PFIZER(Purple Top)SARS-COV-2 Vaccination 07/20/2019, 08/10/2019, 04/14/2020   PNEUMOCOCCAL CONJUGATE-20 09/08/2023   Pfizer(Comirnaty)Fall Seasonal Vaccine 12 years and older 07/29/2022   Pneumococcal Polysaccharide-23 01/17/2018   Polio, Unspecified 08/27/1962   Respiratory Syncytial Virus Vaccine,Recomb Aduvanted(Arexvy) 04/29/2022   Tdap 10/20/2006    Health Maintenance  Topic Date Due   Zoster Vaccines- Shingrix (1 of 2) Never done   DTaP/Tdap/Td (2 - Td or Tdap) 10/19/2016   OPHTHALMOLOGY EXAM  12/02/2021   Colonoscopy  08/31/2022   HEMOGLOBIN A1C  10/07/2022   FOOT EXAM  07/05/2023   MAMMOGRAM  07/08/2023   Diabetic kidney evaluation - eGFR measurement  07/13/2023   COVID-19 Vaccine (6 - Pfizer risk 2024-25 season) 09/20/2023   INFLUENZA VACCINE   12/02/2023   Medicare Annual Wellness (AWV)  12/06/2023   Diabetic kidney evaluation - Urine ACR  12/14/2023   Pneumococcal Vaccine 65-77 Years old  Completed   Hepatitis C Screening  Completed   HIV Screening  Completed   HPV VACCINES  Aged Out   Meningococcal B Vaccine  Aged Out    Discussed health benefits of physical activity, and encouraged her to engage in regular exercise appropriate for her age and condition.  Problem List Items Addressed This Visit       Cardiovascular and Mediastinum   Hypertension associated with diabetes (HCC)   Well controlled Continue current medications Reviewed metabolic panel        Endocrine   Hyperlipidemia associated with type 2 diabetes mellitus (HCC)   Previously well controlled Continue statin Repeat FLP and CMP Goal LDL < 70       Relevant Orders   Lipid panel   Hepatic function panel   Type 2 diabetes mellitus with stage 4 chronic kidney disease, without long-term current use of insulin (HCC)   F/b endo Previously well controlled      Relevant Orders   Pneumococcal conjugate vaccine 20-valent (Completed)     Genitourinary   CKD (chronic kidney disease) stage 4, GFR 15-29 ml/min (HCC) (Chronic)   F/b nephrology      Relevant Orders   Pneumococcal conjugate vaccine 20-valent (Completed)   Anemia in stage 4 chronic kidney disease (HCC) (Chronic)   F/b hematology        Other   Obesity (BMI 30-39.9)   Discussed importance of healthy weight management Discussed diet and exercise       Other Visit Diagnoses       Encounter for annual physical exam    -  Primary     Breast cancer screening by mammogram       Relevant Orders   MM 3D SCREENING MAMMOGRAM BILATERAL BREAST     Immunization due       Relevant Orders   Pneumococcal conjugate vaccine 20-valent (Completed)     Parkinson's disease with fluctuating manifestations, unspecified whether dyskinesia present [facility])       Relevant Orders   Ambulatory referral to  Physical Therapy       Return in about 1 year (around 09/07/2024) for CPE.     Aden Agreste, MD  [facility] [phone] (phone) [phone] (fax)  [facility]

## 2023-09-08 NOTE — Assessment & Plan Note (Signed)
 F/b hematology

## 2023-09-08 NOTE — Assessment & Plan Note (Signed)
 F/b endo Previously well controlled

## 2023-09-08 NOTE — Patient Instructions (Addendum)
 Please be sure to update your Tetanus Vaccine and receive your Zoster (Shingles) Vaccine at your local pharmacy.   Call [facility] to schedule a mammogram [phone]

## 2023-09-08 NOTE — Assessment & Plan Note (Signed)
F/b nephrology ?

## 2023-09-08 NOTE — Assessment & Plan Note (Signed)
 Discussed importance of healthy weight management Discussed diet and exercise

## 2023-09-08 NOTE — Assessment & Plan Note (Signed)
Well controlled Continue current medications Reviewed metabolic panel 

## 2023-09-09 ENCOUNTER — Encounter: Payer: Self-pay | Admitting: Family Medicine

## 2023-09-09 ENCOUNTER — Inpatient Hospital Stay

## 2023-09-09 VITALS — BP 119/79 | HR 68 | Temp 97.3°F | Resp 17

## 2023-09-09 DIAGNOSIS — D631 Anemia in chronic kidney disease: Secondary | ICD-10-CM

## 2023-09-09 DIAGNOSIS — N184 Chronic kidney disease, stage 4 (severe): Secondary | ICD-10-CM | POA: Diagnosis not present

## 2023-09-09 LAB — LIPID PANEL
Chol/HDL Ratio: 1.8 ratio (ref 0.0–4.4)
Cholesterol, Total: 150 mg/dL (ref 100–199)
HDL: 83 mg/dL (ref >39)
LDL Chol Calc (NIH): 55 mg/dL (ref 0–99)
Triglycerides: 61 mg/dL (ref 0–149)
VLDL Cholesterol Cal: 12 mg/dL (ref 5–40)

## 2023-09-09 LAB — HEPATIC FUNCTION PANEL
ALT: 14 IU/L (ref 0–32)
AST: 25 IU/L (ref 0–40)
Albumin: 4.5 g/dL (ref 3.9–4.9)
Alkaline Phosphatase: 105 IU/L (ref 44–121)
Bilirubin Total: <0.2 mg/dL (ref 0.0–1.2)
Bilirubin, Direct: 0.08 mg/dL (ref 0.00–0.40)
Total Protein: 7.1 g/dL (ref 6.0–8.5)

## 2023-09-09 MED ORDER — IRON SUCROSE 20 MG/ML IV SOLN
200.0000 mg | Freq: Once | INTRAVENOUS | Status: AC
  Administered 2023-09-09: 200 mg via INTRAVENOUS
  Filled 2023-09-09: qty 10

## 2023-09-09 MED ORDER — SODIUM CHLORIDE 0.9% FLUSH
10.0000 mL | Freq: Once | INTRAVENOUS | Status: AC | PRN
  Administered 2023-09-09: 10 mL
  Filled 2023-09-09: qty 10

## 2023-09-09 NOTE — Progress Notes (Signed)
Patient tolerated Venofer infusion well, no questions/concerns voiced. Monitored 30 min post transfusion. Patient stable at discharge. VSS. AVS given.

## 2023-09-09 NOTE — Patient Instructions (Signed)

## 2023-09-12 ENCOUNTER — Ambulatory Visit (INDEPENDENT_AMBULATORY_CARE_PROVIDER_SITE_OTHER): Admitting: Clinical

## 2023-09-12 ENCOUNTER — Encounter: Payer: Self-pay | Admitting: Clinical

## 2023-09-12 DIAGNOSIS — F330 Major depressive disorder, recurrent, mild: Secondary | ICD-10-CM

## 2023-09-12 DIAGNOSIS — F411 Generalized anxiety disorder: Secondary | ICD-10-CM | POA: Diagnosis not present

## 2023-09-12 NOTE — Progress Notes (Signed)
   Abigail Barthel, LCSW

## 2023-09-12 NOTE — Progress Notes (Signed)
 [facility] Counselor/Therapist Progress Note  Patient ID: Abigail Figueroa, MRN: [id number],    Date: 09/12/2023  Time Spent: 12:34pm - 1:20pm : 46 minutes    Treatment Type: Individual Therapy  Reported Symptoms: none reported  Mental Status Exam: Appearance:  Neat and Well Groomed     Behavior: Appropriate  Motor: Normal  Speech/Language:  Clear and Coherent and Normal Rate  Affect: Appropriate  Mood: normal  Thought process: normal  Thought content:   WNL  Sensory/Perceptual disturbances:   WNL  Orientation: oriented to person, place, time/date, and situation  Attention: Good  Concentration: Good  Memory: WNL  Fund of knowledge:  Good  Insight:   Good  Judgment:  Good  Impulse Control: Good   Risk Assessment: Danger to Self:  No Patient denied current suicidal ideation  Self-injurious Behavior: No Danger to Others: No Patient denied current homicidal ideation Duty to Warn:no Physical Aggression / Violence:No  Access to Firearms a concern: No  Gang Involvement:No   Subjective: Patient stated, "it's been going pretty good" in response to events since last session. Patient stated, "I've had some doctors visit [facility]  Patient stated, "I've been feeling ok", "I've had some few bumps in the road, I've been doing pretty good". Patient stated, "I'm doing pretty good" in response to patient's current mood. Patient stated, "I agree with it" in reference to diagnoses. Patient stated, "at least 1-2 times per week" in reference to occurrence of symptoms associated with patient's long term goal. Patient stated, "I think I'm better", "we can keep working on it a little bit" in response to patient's first short term goal. Patient stated, "not good, I'm trying though" in response to patient's second short term goal. Patient stated, "I've made some progress" and reported need to continue to work towards achieving goal  in response to patient's third short term goal. Patient reported patient's grandson is using an illegal substance in patient's home. Patient stated, "I can't even describe" in reference to patient's thoughts/feelings as it relates to grandson using a substance in their home. Patient stated, "something has to be done about it" in reference to grandson. Patient stated, "we don't want this (substance use) in our home". Patient reported patient's grandson experiences anxiety and patient reported she feels grandson would benefit from participating in therapy. Patient stated, "I would like to work on what I can do as a mother and as a grandmother to help my grandson get off this mess (substances)". Patient stated, "I would like to help my daughter except the role of the mother as far as expressing how her son needs to be off this (substance)".   Interventions: Motivational Interviewing. Clinician conducted session via caregility video from clinician's home office. Patient provided verbal consent to proceed with telehealth session and is aware of limitations of telephone or video visits. Patient participated in session from patient's home. Reviewed events since last session and assessed for changes. Clinician reviewed diagnoses and treatment recommendations. Provided psycho education related to diagnoses and treatment. Reviewed patient's goals for therapy and patient's progress towards goals. Clinician utilized motivational interviewing to explore additional goals for therapy. Clinician utilized a task centered approach in collaboration with patient to develop additional goals for therapy. Patient participated in development of goals and agreed to goals for therapy.    Collaboration of Care: not required at this time   Diagnosis:  Mild episode of recurrent major depressive disorder (HCC)  Generalized anxiety disorder   Plan: Patient is to utilize [employer] [facility], thought re-framing, relaxation  techniques, mindfulness, effective communication, and coping strategies to decrease symptoms associated with Major Depressive Disorder and Generalized Anxiety Disorder. Frequency: bi-weekly  Modality: individual      Long-term goal:   Patient stated, "I want to see a change in the anxiety".      Reduce overall level, frequency, and intensity of the symptoms of anxiety and depression as evidenced by decreased sadness, feelings of anxiety, feeling "lost", lack of energy, difficulty falling and staying asleep, changes in concentration, psychomotor retardation, rapid heart rate, excessive worry, restlessness, feeling jittery, and feeling on edge from 5 to 6 days per week to 0 to 1 days per week per patient reported for at least 3 consecutive months.   Target Date: 09/11/24  Progress: progressing    Short-term goal:  Develop coping strategies to utilize in response to symptoms of depression/anxiety and stressors  Target Date: 09/11/24  Progress: progressing    Verbally express patient's thoughts and feelings to others and utilize effective communication strategies when expressing patient's thoughts/feelings    Target Date: 09/11/24  Progress: progressing    Verbalize an understanding of the relationship between symptoms of anxiety and the impact on patient's thought patterns and behaviors    Target Date: 09/11/24  Progress: progressing   Develop strategies for patient to utilize to support patient's daughter and grandson as it relates to grandson using illegal substances.  Target Date: 09/11/24  Progress: established 09/12/23   Develop and implement coping strategies for patient to utilize in response to grandson using illegal substances Target Date: 09/11/24  Progress: established 09/12/23    Burlene Carpen, LCSW

## 2023-09-13 ENCOUNTER — Ambulatory Visit: Payer: Self-pay

## 2023-09-13 NOTE — Telephone Encounter (Signed)
 Copied from CRM [id number]. Topic: Clinical - Red Word Triage >> Sep 13, 2023  2:47 PM [place] B wrote: [facility] that prompted transfer to Nurse Triage: pt says has extreme pain , redness, swelling and warm to the touch  Chief Complaint: Redness, swelling and tenderness of upper right arm Symptoms: Area warm to the touch Frequency: 4-5 days Pertinent Negatives: Patient denies fever Disposition: [] ED /[] Urgent Care (no appt availability in office) / [x] Appointment(In office/virtual)/ []  [facility]/ [] Home Care/ [] Refused Recommended Disposition /[] [facility] [facility]/ []  Follow-up with PCP Additional Notes: Patient called in to report redness, swelling and tenderness of her right upper arm. Patient stated she received a pneumonia vaccine in this arm on 09/08/23. Patient stated symptoms started a day after getting the vaccine. Patient denied fever and additional symptoms at this time. Advised patient to be seen within 3 days, per protocol. No availability with PCP. Scheduled with alternate provider in office. Provided care advice and instructed patient to call back if symptoms worsen. Patient complied.   Reason for Disposition  [1] Pain, tenderness, or swelling at the injection site AND [2] persists > 3 days  Answer Assessment - Initial Assessment Questions 1. ONSET: "When did the swelling start?" (e.g., minutes, hours, days)     4-5 days  2. LOCATION: "What part of the arm is swollen?"  "Are both arms swollen or just one arm?"     Right upper arm where shot was given 3. SEVERITY: "How bad is the swelling?" (e.g., localized; mild, moderate, severe)   - LOCALIZED: Small area of puffiness or swelling on just one arm   - JOINT SWELLING: Swelling of one joint   - MILD: Puffiness or swelling of hand   - MODERATE: Puffiness or swollen feeling of entire arm    - SEVERE: All of arm looks swollen; pitting edema     Localized  4. REDNESS: "Does the swelling look red or infected?"      Yes 5. PAIN: "Is the swelling painful to touch?" If Yes, ask: "How painful is it?"   (Scale 1-10; mild, moderate or severe)     Rates pain an 8 6. FEVER: "Do you have a fever?" If Yes, ask: "What is it, how was it measured, and when did it start?"      Denies 7. CAUSE: "What do you think is causing the arm swelling?"     Recent pneumonia vaccine 9. RECURRENT SYMPTOM: "Have you had arm swelling before?" If Yes, ask: "When was the last time?" "What happened that time?"     States she reacted similarly after received a flu shot "one time"  10. OTHER SYMPTOMS: "Do you have any other symptoms?" (e.g., chest pain, difficulty breathing)       Itchiness, redness, denies discharge/drainage, denies cold/flu symptoms, denies difficulty breathing, denies swelling of airway/tongue Received a pneumonia shot in right arm on 09/08/23, states swelling started right after, area is warm to the touch  Protocols used: Arm Swelling and Edema-A-AH, Immunization Reactions-A-AH

## 2023-09-14 ENCOUNTER — Ambulatory Visit: Attending: Family Medicine

## 2023-09-14 DIAGNOSIS — R262 Difficulty in walking, not elsewhere classified: Secondary | ICD-10-CM | POA: Diagnosis not present

## 2023-09-14 DIAGNOSIS — R251 Tremor, unspecified: Secondary | ICD-10-CM | POA: Diagnosis not present

## 2023-09-14 DIAGNOSIS — G20A2 Parkinson's disease without dyskinesia, with fluctuations: Secondary | ICD-10-CM | POA: Diagnosis not present

## 2023-09-14 DIAGNOSIS — R2689 Other abnormalities of gait and mobility: Secondary | ICD-10-CM | POA: Diagnosis not present

## 2023-09-14 DIAGNOSIS — M6281 Muscle weakness (generalized): Secondary | ICD-10-CM | POA: Insufficient documentation

## 2023-09-14 NOTE — Therapy (Addendum)
 OUTPATIENT PHYSICAL THERAPY NEURO EVALUATION   Patient Name: Abigail Figueroa MRN: [id number] DOB:1960/08/22, 63 y.o., female Today's Date: 09/14/2023   PCP: Mazie Speed, MD  REFERRING PROVIDER: Mazie Speed, MD   END OF SESSION:   PT End of Session - 09/14/23 1459     Visit Number 1    Number of Visits 25    Date for PT Re-Evaluation 12/07/23    Authorization Type VL based on auth    Authorization Time Period 09/14/23-12/07/23    Progress Note Due on Visit 10    PT Start Time 1451    PT Stop Time 1530    PT Time Calculation (min) 39 min    Equipment Utilized During Treatment Gait belt    Activity Tolerance Patient tolerated treatment well    Behavior During Therapy WFL for tasks assessed/performed             Past Medical History:  Diagnosis Date   Abnormal stress test    a. 10/2009: Normal myocardial perfusion imaging; b. 08/2015 MV: medium defect of mod severity in mid ant apical region w/ mild HK of the distal inf wall;  c. 08/2015 Cath: nl Cors, EF 60%.   Allergy     Not sure   Anemia    Angio-edema    Anxiety    Arthritis    Bell's palsy    CHF (congestive heart failure) (HCC)    Not sure   Chronic kidney disease 2024   Depression    Dermatitis 09/10/2014   Edema    Essential hypertension    Frequent urination    Frequent urination at night    Headache    migraines, gets botox injections   Heart palpitations 10/2011   Event monitor showing sinus tachycardia, PACs with couplets and triplets.   Hx of echocardiogram    a. 10/2009 Echo: showed normal left ventricular function, mild left ventricular hypertrophy, and no significant valve abnormalities.   Hypercholesterolemia    Migraine    Morbid obesity (HCC)    Neuromuscular disorder (HCC)    OSA (obstructive sleep apnea)    has been on continuous positive airway pressure   Seizure disorder (HCC)    Seizures (HCC)    Sleep apnea    Not sure   Type II diabetes mellitus (HCC)     Urticaria    Past Surgical History:  Procedure Laterality Date   ABDOMINAL HYSTERECTOMY  1990   without BSO   BACK SURGERY  1900s 2001   x2 with "cage put in"   BILATERAL SALPINGOOPHORECTOMY  1990   CARDIAC CATHETERIZATION N/A 08/06/2015   Procedure: Left Heart Cath and Coronary Angiography;  Surgeon: Millicent Ally, MD;  Location: [facility];  Service: Cardiovascular;  Laterality: N/A;   CARPAL TUNNEL RELEASE Bilateral    CHOLECYSTECTOMY  1980   COLONOSCOPY WITH PROPOFOL  N/A 08/30/2017   Procedure: COLONOSCOPY WITH PROPOFOL ;  Surgeon: Selena Daily, MD;  Location: [facility];  Service: Endoscopy;  Laterality: N/A;   DG THUMB LEFT HAND  01/24/2018   repair joint in left thumb   DORSAL COMPARTMENT RELEASE Left 10/25/2014   Procedure: LEFT FIRST  DORSAL COMPARTMENT RELEASE AND RADIAL TENOSYNOVECTOMY ;  Surgeon: Ronn Cohn, MD;  Location: [facility];  Service: Orthopedics;  Laterality: Left;   HAND SURGERY     KNEE SURGERY Right 2012   meniscus tear   KNEE SURGERY  2012   LAMINECTOMY  1995   TUBAL  LIGATION  1982   WRIST SURGERY Right    Patient Active Problem List   Diagnosis Date Noted   Seasonal allergies 07/25/2023   Gout involving toe of left foot 07/25/2023   Tremor 10/14/2022   Other chest pain 07/29/2022   Anemia in stage 4 chronic kidney disease (HCC) 07/19/2022   CKD (chronic kidney disease) stage 4, GFR 15-29 ml/min (HCC) 06/03/2022   Seasonal and perennial allergic rhinitis 10/11/2019   Angio-edema 08/30/2019   Obesity (BMI 30-39.9) 02/06/2019   Osteoarthrosis of hand 12/28/2017   Right-sided Bell's palsy 02/02/2016   Spondylolisthesis of lumbar region 08/25/2015   Type 2 diabetes mellitus with stage 4 chronic kidney disease, without long-term current use of insulin (HCC)    History of brain disorder 11/05/2014   Depression, major, recurrent, moderate (HCC) 11/05/2014   Generalized anxiety disorder 10/16/2014   Hypertension  associated with diabetes (HCC) 10/14/2014   Epilepsy (HCC) 10/14/2014   Migraine 10/14/2014   Dysthymic disorder 10/14/2014   Dyspepsia 10/14/2014   Hematochezia 10/14/2014   Urge incontinence 10/14/2014   Alopecia 09/10/2014   Blood in feces 09/10/2014   Arthralgia of hip 09/10/2014   Snapping thumb syndrome 04/19/2014   De Quervain's disease (radial styloid tenosynovitis) 03/08/2014   Hyperlipidemia associated with type 2 diabetes mellitus (HCC) 01/15/2013   Heart palpation 01/15/2013   Congestive heart failure (HCC) 11/13/2009   HYPERCALCEMIA 05/08/2007   Clinical depression 10/20/2006   Obstructive apnea 03/22/2006    ONSET DATE: 10/02/2022  REFERRING DIAG: G20.A2 (ICD-10-CM) - Parkinson's disease with fluctuating manifestations, unspecified whether dyskinesia present (HCC)   THERAPY DIAG:  Balance disorder  Difficulty in walking, not elsewhere classified  Muscle weakness (generalized)  Shakiness  Rationale for Evaluation and Treatment: Rehabilitation  SUBJECTIVE:                                                                                                                                                                                             SUBJECTIVE STATEMENT:  Pt was diagnosed with Parkinson's back in June of 2024.  Pt noted that she was experiencing tremors prior to that, but the formal diagnosis happened in June.  Pt is seeking therapy in order to be able to control her hands and to improve her balance.     Pt accompanied by: significant other  PERTINENT HISTORY:  PMH: Hypertension associated with diabetes, Hyperlipidemia associated with type 2 diabetes mellitus, Type 2 diabetes mellitus with stage 4 chronic kidney disease, without long-term current use of insulin, CKD (chronic kidney disease) stage 4, GFR 15-29 ml/min (HCC), Obesity, anemia,  Parkinson's disease with fluctuating manifestations, unspecified whether  dyskinesia present, hx of gout in L  great toe and Achille's tendonitis  PAIN:  Are you having pain? Yes, but only where she received the pneumonia shot.    PRECAUTIONS: None  RED FLAGS: None   WEIGHT BEARING RESTRICTIONS: No  FALLS: Has patient fallen in last 6 months? No  LIVING ENVIRONMENT: Lives with: lives with their family Lives in: House/apartment Stairs: Yes: External: 1 on the front , 5 steps on the back steps; can reach both on the back of the house Has following equipment at home: Single point cane and Walker - 2 wheeled  PLOF: Independent  PATIENT GOALS: Pt is hoping to be able to control her hands better and to be able to ambulate without fear of losing balance.  OBJECTIVE:  Note: Objective measures were completed at Evaluation unless otherwise noted.  DIAGNOSTIC FINDINGS: N/A  COGNITION: Overall cognitive status: Within functional limits for tasks assessed   SENSATION: WFL  COORDINATION: WFL   POSTURE: rounded shoulders, forward head, and anterior pelvic tilt  LOWER EXTREMITY MMT:    MMT Right Eval Left Eval  Hip flexion 4+ 4+  Hip extension    Hip abduction 4+ 4+  Hip adduction 4+ 4+  Hip internal rotation    Hip external rotation    Knee flexion 4+ 4+  Knee extension 4+ 4+  Ankle dorsiflexion 5 5  Ankle plantarflexion    Ankle inversion    Ankle eversion    (Blank rows = not tested)  BED MOBILITY:  Not tested  TRANSFERS: Sit to stand: Complete Independence  Assistive device utilized: None     Stand to sit: Complete Independence  Assistive device utilized: None     Chair to chair: Complete Independence  Assistive device utilized: None       RAMP:  Not tested  CURB:  Not tested  STAIRS: Findings: Level of Assistance: Complete Independence, Stair Negotiation Technique: Alternating Pattern  Forwards with Bilateral Rails, Number of Stairs: 8, Height of Stairs: 6"   , and Comments: Pt performed well with using singular UE to support self at times, and bilateral on the  final step descending.  GAIT: Findings: Pt with good gait mechanics during short distance walking, just slowed performance.  FUNCTIONAL TESTS:  5 times sit to stand: 12.69 sec with one instance of instability back into the chair. Timed up and go (TUG): 11.69 sec 6 minute walk test: TBD at next session 10 meter walk test: 10.15 sec/ 0.98 m/s Dynamic Gait Index: TBD at next session SLS on R LE: 9.81 sec; L LE: 17.82 sec  PATIENT SURVEYS:  ABC scale 65.6%                                                                                                                              TREATMENT DATE: 09/14/2023  Eval Only due to pt's late arrival.     PATIENT EDUCATION: Education details: Pt educated on role of PT  and services provided during current POC, along with prognosis and information about the clinic. Person educated: Patient and Spouse Education method: Explanation Education comprehension: verbalized understanding  HOME EXERCISE PROGRAM:  To be established at the next visit and given to pt.  GOALS: Goals reviewed with patient? Yes  SHORT TERM GOALS: Target date: 10/26/2023  Pt will be independent with HEP in order to demonstrate increased ability to perform tasks related to occupation/hobbies. Baseline:  Pt to be given HEP at next visit. Goal status: INITIAL   LONG TERM GOALS: Target date: 12/07/2023  1.  Patient (> 18 years old) will complete five times sit to stand test in <10 seconds indicating an increased LE strength and improved balance. Baseline: 12.69 sec Goal status: INITIAL  2.  Patient will increase ABC score to improve by 8%   to demonstrate statistically significant improvement in mobility and quality of life.  Baseline: 65.6% Goal status: INITIAL   3.  Patient will increase DGI score by > ___ points to demonstrate decreased fall risk during functional activities. Baseline: TBD at next visit Goal status: INITIAL   4.  Patient will reduce timed up and  go to <11 seconds to reduce fall risk and demonstrate improved transfer/gait ability. Baseline: 11.69 sec Goal status: INITIAL  5.  Patient will increase 10 meter walk test to >1.12m/s as to improve gait speed for better community ambulation and to reduce fall risk. Baseline: 0.98 m/s Goal status: INITIAL  6.  Patient will increase six minute walk test distance to >1000 for progression to community ambulator and improve gait ability Baseline: TBD at next visit. Goal status: INITIAL    ASSESSMENT:  CLINICAL IMPRESSION: Patient is a 63 y.o. female who was seen today for physical therapy evaluation and treatment for Parkinson's.  Pt current demonstrates weakness in the LE's and self reported balance deficits that would benefit from skilled PT intervention to improve.  Pt mobilizes well, however would benefit from skilled therapy to improve overall confidence, gait speed, and balance with everyday tasks.  Pt demonstrated good prognosis to improve those deficits and also wants to improve UE usage.   Pt will continue to benefit from skilled therapy to address remaining deficits in order to improve overall QoL and return to PLOF.     OBJECTIVE IMPAIRMENTS: Abnormal gait, decreased activity tolerance, decreased balance, decreased endurance, decreased knowledge of condition, decreased mobility, difficulty walking, decreased strength, impaired perceived functional ability, and impaired UE functional use.   ACTIVITY LIMITATIONS: carrying, lifting, squatting, reach over head, and locomotion level  PARTICIPATION LIMITATIONS: shopping, community activity, occupation, and yard work  PERSONAL FACTORS: Age, Past/current experiences, Time since onset of injury/illness/exacerbation, and 3+ comorbidities: CKD, Anemia, clinical depression, tremor, epilepsy, DM2, Migraines, CHF, HTN are also affecting patient's functional outcome.   REHAB POTENTIAL: Good  CLINICAL DECISION MAKING: Evolving/moderate  complexity  EVALUATION COMPLEXITY: Moderate  PLAN:  PT FREQUENCY: 1-2x/week  PT DURATION: 12 weeks  PLANNED INTERVENTIONS: 97750- Physical Performance Testing, 97110-Therapeutic exercises, 97530- Therapeutic activity, 97112- Neuromuscular re-education, 97535- Self Care, 40981- Manual therapy, [phone]- Gait training, Vestibular training, and DME instructions  PLAN FOR NEXT SESSION:  Establish HEP DGI and 6 Minute Walk Test    Rozanna Corner, PT, DPT Physical Therapist - [facility]  09/14/23, 6:25 PM

## 2023-09-15 ENCOUNTER — Ambulatory Visit: Payer: Self-pay | Admitting: Physician Assistant

## 2023-09-15 ENCOUNTER — Encounter: Payer: Self-pay | Admitting: Physician Assistant

## 2023-09-15 ENCOUNTER — Ambulatory Visit
Admission: RE | Admit: 2023-09-15 | Discharge: 2023-09-15 | Disposition: A | Discharge status: Home / Self Care | Source: Ambulatory Visit | Attending: Physician Assistant | Admitting: Physician Assistant

## 2023-09-15 ENCOUNTER — Ambulatory Visit: Admitting: Physician Assistant

## 2023-09-15 VITALS — BP 130/63 | Temp 98.1°F | Resp 16 | Ht 63.0 in | Wt 186.0 lb

## 2023-09-15 DIAGNOSIS — R229 Localized swelling, mass and lump, unspecified: Secondary | ICD-10-CM | POA: Insufficient documentation

## 2023-09-15 DIAGNOSIS — E162 Hypoglycemia, unspecified: Secondary | ICD-10-CM

## 2023-09-15 DIAGNOSIS — M7989 Other specified soft tissue disorders: Secondary | ICD-10-CM | POA: Diagnosis not present

## 2023-09-15 DIAGNOSIS — M79601 Pain in right arm: Secondary | ICD-10-CM | POA: Diagnosis not present

## 2023-09-15 MED ORDER — DOXYCYCLINE HYCLATE 100 MG PO TABS
100.0000 mg | ORAL_TABLET | Freq: Two times a day (BID) | ORAL | 0 refills | Status: DC
Start: 2023-09-15 — End: 2024-02-29

## 2023-09-15 MED ORDER — CETIRIZINE HCL 10 MG PO TABS
10.0000 mg | ORAL_TABLET | Freq: Every day | ORAL | 0 refills | Status: DC
Start: 2023-09-15 — End: 2023-10-10

## 2023-09-15 NOTE — Progress Notes (Unsigned)
 Established patient visit  Patient: Abigail Figueroa   DOB: [birth year]   63 y.o. Female  MRN: [id number] Visit Date: 09/15/2023  Today's healthcare provider: Blane Bunting, PA-C   Chief Complaint  Patient presents with   Swelling       Subjective       Discussed the use of AI scribe software for clinical note transcription with the patient, who gave verbal consent to proceed.  History of Present Illness   Swelling , redness at injection site  ( Prevnar 20)     09/08/2023    9:35 AM 07/25/2023    9:30 AM 12/06/2022   11:47 AM  Depression screen PHQ 2/9  Decreased Interest 1 1 0  Down, Depressed, Hopeless 1 1 0  PHQ - 2 Score 2 2 0  Altered sleeping 0 1   Tired, decreased energy 1 1   Change in appetite 0 0   Feeling bad or failure about yourself  1 1   Trouble concentrating 1 1   Moving slowly or fidgety/restless 0 0   Suicidal thoughts 0 0   PHQ-9 Score 5 6   Difficult doing work/chores Somewhat difficult Not difficult at all Not difficult at all      09/08/2023    9:36 AM 07/25/2023    9:31 AM  GAD 7 : Generalized Anxiety Score  Nervous, Anxious, on Edge 0 1  Control/stop worrying 2 1  Worry too much - different things 2 1  Trouble relaxing 2   Restless 2 1  Easily annoyed or irritable 1 0  Afraid - awful might happen 2 1  Total GAD 7 Score 11   Anxiety Difficulty Somewhat difficult Not difficult at all    Medications: Outpatient Medications Prior to Visit  Medication Sig   amLODipine  (NORVASC ) 5 MG tablet TAKE 1.5 TABLET (7.5MG ) BY MOUTH DAILY.   ARIPiprazole (ABILIFY) 10 MG tablet SMARTSIG:1 Tablet(s) By Mouth Every Evening   Azelastine  HCl 0.15 % SOLN PLACE 2 SPRAYS INTO BOTH NOSTRILS 2 (TWO) TIMES DAILY (Patient taking differently: Place 2 sprays into both nostrils 2 (two) times daily. Taking as needed)   Azelastine -Fluticasone  (DYMISTA ) 137-50 MCG/ACT SUSP Place 2 sprays into both nostrils in the morning and at bedtime.   BOTOX 100 UNITS SOLR injection  Inject into the muscle every 3 (three) months.    busPIRone  (BUSPAR ) 15 MG tablet TAKE 1 TABLET BY MOUTH TWICE A DAY   carbidopa-levodopa (SINEMET IR) 25-100 MG tablet Take 1 tablet by mouth Three (3) times a day. Start with 1/2 pill three times a day, after a week can increase to 1 pill three times a day.   chlorproMAZINE  (THORAZINE ) 25 MG tablet Take 50 mg by mouth 2 (two) times daily as needed.   chlorzoxazone (PARAFON) 500 MG tablet Take by mouth 4 (four) times daily as needed for muscle spasms.   clotrimazole -betamethasone  (LOTRISONE ) cream Apply 1 Application topically 2 (two) times daily.   EMGALITY 120 MG/ML SOAJ Inject into the skin as needed.   EPINEPHrine  0.3 mg/0.3 mL IJ SOAJ injection Inject into the muscle.   ezetimibe  (ZETIA ) 10 MG tablet TAKE 1 TABLET BY MOUTH EVERY DAY   fluocinolone (SYNALAR) 0.01 % external solution Apply topically as needed.   FLUoxetine (PROZAC) 10 MG capsule Take 40 mg by mouth daily.   fluticasone  (FLONASE ) 50 MCG/ACT nasal spray PLACE 2 SPRAYS INTO BOTH NOSTRILS IN THE MORNING AND AT BEDTIME.   furosemide  (LASIX ) 40 MG tablet Take 1 tablet (40  mg total) by mouth daily.   hydroxypropyl methylcellulose / hypromellose (ISOPTO TEARS / GONIOVISC) 2.5 % ophthalmic solution Place 1 drop into both eyes 3 (three) times daily as needed for dry eyes.   lamoTRIgine  (LAMICTAL ) 100 MG tablet Take 100 mg by mouth 2 (two) times daily.   Lancets (ONETOUCH ULTRASOFT) lancets Test fasting each morning and 2 hours before supper. Retest if having hypoglycemic symptoms. (Patient taking differently: 1 each by Other route as needed for other. Test fasting each morning and 2 hours before supper. Retest if having hypoglycemic symptoms.)   levocetirizine (XYZAL ) 5 MG tablet Take 1 tablet (5 mg total) by mouth 2 (two) times a week.   metoprolol  tartrate (LOPRESSOR ) 100 MG tablet TAKE 1 TABLET BY MOUTH TWICE A DAY   montelukast  (SINGULAIR ) 10 MG tablet TAKE 1 TABLET BY MOUTH EVERYDAY AT  BEDTIME   ONETOUCH VERIO test strip TEST FASTING GLUCOSE DAILY AS DIRECTED   promethazine  (PHENERGAN ) 25 MG tablet Take 25 mg by mouth every 4 (four) hours as needed.   rosuvastatin  (CRESTOR ) 20 MG tablet TAKE 1 TABLET BY MOUTH EVERY DAY   Semaglutide ,0.25 or 0.5MG /DOS, (OZEMPIC , 0.25 OR 0.5 MG/DOSE,) 2 MG/3ML SOPN Inject 0.25 mg into the skin once a week. (Patient taking differently: Inject 0.5 mg into the skin once a week.)   TEGRETOL -XR 400 MG 12 hr tablet TAKE 1 TABLET BY MOUTH TWICE A DAY (NEED APPT PRIOR TO NEXT REFILL)   tretinoin (RETIN-A) 0.025 % cream Apply topically at bedtime.   triamcinolone  ointment (KENALOG ) 0.1 % Apply 1 Application topically 2 (two) times daily.   No facility-administered medications prior to visit.    Review of Systems All negative Except see HPI   {Insert previous labs (optional):23779} {See past labs  Heme  Chem  Endocrine  Serology  Results Review (optional):1}   Objective    BP 130/63 (BP Location: Left Arm, Patient Position: Sitting)   Temp 98.1 F (36.7 C) (Oral)   Resp 16   Ht 5\' 3"  (1.6 m)   Wt 186 lb (84.4 kg)   LMP  (LMP Unknown)   SpO2 100%   BMI 32.95 kg/m  {Insert last BP/Wt (optional):23777}{See vitals history (optional):1}   Physical Exam Constitutional:      General: She is not in acute distress.    Appearance: Normal appearance.  HENT:     Head: Normocephalic.  Pulmonary:     Effort: Pulmonary effort is normal. No respiratory distress.  Skin:    Findings: Erythema (mild) and rash present.  Neurological:     Mental Status: She is alert and oriented to person, place, and time. Mental status is at baseline.      Results for orders placed or performed in visit on 09/15/23  HM DIABETES EYE EXAM  Result Value Ref Range   HM Diabetic Eye Exam Retinopathy (A) No Retinopathy        Assessment & Plan   Swelling at injection site (Primary) Acute After prevnar 20, developed swelling on the right arm.  Initial  treatment involves cold compresses to reduce swelling and redness.[1]  Analgesics like acetaminophen  or ibuprofen can be used for pain management.[1]   - US  Venous Img Upper Uni Right(DVT); Future - doxycycline  (VIBRA -TABS) 100 MG tablet; Take 1 tablet (100 mg total) by mouth 2 (two) times daily.  Dispense: 10 tablet; Refill: 0 - cetirizine  (ZYRTEC ) 10 MG tablet; Take 1 tablet (10 mg total) by mouth daily.  Dispense: 30 tablet; Refill: 0  Hypoglycemia  During the visit , pt's Poct glucose showed 40, glucose tablet 1mcg was given by CMA , pt 's glucose increase to 72 and pt was d/charged. Warning precautions were provided. Pt was provided with a sample of libre 3 plus for glucose control Currently on semaglutide  0.5 mg once a week Follow-up with pcp was scheduled.   Orders Placed This Encounter  Procedures   US  Venous Img Upper Uni Right(DVT)    Standing Status:   Future    Number of Occurrences:   1    Expiration Date:   09/14/2024    Reason for Exam (SYMPTOM  OR DIAGNOSIS REQUIRED):   pain and swelling and redness in the injection site of the right arm, radiation to neck    Preferred imaging location?:   [facility]    No follow-ups on file.   The patient was advised to call back or seek an in-person evaluation if the symptoms worsen or if the condition fails to improve as anticipated.  I discussed the assessment and treatment plan with the patient. The patient was provided an opportunity to ask questions and all were answered. The patient agreed with the plan and demonstrated an understanding of the instructions.  I, Kelani Robart, PA-C have reviewed all documentation for this visit. The documentation on 09/15/2023  for the exam, diagnosis, procedures, and orders are all accurate and complete.  Blane Bunting, [facility], MMS [facility] [phone] (phone) [phone] (fax)  [facility]

## 2023-09-18 NOTE — Therapy (Incomplete)
 OUTPATIENT PHYSICAL THERAPY NEURO EVALUATION   Patient Name: Abigail Figueroa MRN: [id number] DOB:07/11/1960, 63 y.o., female Today's Date: 09/18/2023   PCP: Mazie Speed, MD  REFERRING PROVIDER: Mazie Speed, MD   END OF SESSION:     Past Medical History:  Diagnosis Date   Abnormal stress test    a. 10/2009: Normal myocardial perfusion imaging; b. 08/2015 MV: medium defect of mod severity in mid ant apical region w/ mild HK of the distal inf wall;  c. 08/2015 Cath: nl Cors, EF 60%.   Allergy     Not sure   Anemia    Angio-edema    Anxiety    Arthritis    Bell's palsy    CHF (congestive heart failure) (HCC)    Not sure   Chronic kidney disease 2024   Depression    Dermatitis 09/10/2014   Edema    Essential hypertension    Frequent urination    Frequent urination at night    Headache    migraines, gets botox injections   Heart palpitations 10/2011   Event monitor showing sinus tachycardia, PACs with couplets and triplets.   Hx of echocardiogram    a. 10/2009 Echo: showed normal left ventricular function, mild left ventricular hypertrophy, and no significant valve abnormalities.   Hypercholesterolemia    Migraine    Morbid obesity (HCC)    Neuromuscular disorder (HCC)    OSA (obstructive sleep apnea)    has been on continuous positive airway pressure   Seizure disorder (HCC)    Seizures (HCC)    Sleep apnea    Not sure   Type II diabetes mellitus (HCC)    Urticaria    Past Surgical History:  Procedure Laterality Date   ABDOMINAL HYSTERECTOMY  1990   without BSO   BACK SURGERY  1900s 2001   x2 with "cage put in"   BILATERAL SALPINGOOPHORECTOMY  1990   CARDIAC CATHETERIZATION N/A 08/06/2015   Procedure: Left Heart Cath and Coronary Angiography;  Surgeon: Millicent Ally, MD;  Location: [facility];  Service: Cardiovascular;  Laterality: N/A;   CARPAL TUNNEL RELEASE Bilateral    CHOLECYSTECTOMY  1980   COLONOSCOPY WITH PROPOFOL  N/A 08/30/2017    Procedure: COLONOSCOPY WITH PROPOFOL ;  Surgeon: Selena Daily, MD;  Location: [facility];  Service: Endoscopy;  Laterality: N/A;   DG THUMB LEFT HAND  01/24/2018   repair joint in left thumb   DORSAL COMPARTMENT RELEASE Left 10/25/2014   Procedure: LEFT FIRST  DORSAL COMPARTMENT RELEASE AND RADIAL TENOSYNOVECTOMY ;  Surgeon: Ronn Cohn, MD;  Location: [facility];  Service: Orthopedics;  Laterality: Left;   HAND SURGERY     KNEE SURGERY Right 2012   meniscus tear   KNEE SURGERY  2012   LAMINECTOMY  1995   TUBAL LIGATION  1982   WRIST SURGERY Right    Patient Active Problem List   Diagnosis Date Noted   Seasonal allergies 07/25/2023   Gout involving toe of left foot 07/25/2023   Tremor 10/14/2022   Other chest pain 07/29/2022   Anemia in stage 4 chronic kidney disease (HCC) 07/19/2022   CKD (chronic kidney disease) stage 4, GFR 15-29 ml/min (HCC) 06/03/2022   Seasonal and perennial allergic rhinitis 10/11/2019   Angio-edema 08/30/2019   Obesity (BMI 30-39.9) 02/06/2019   Osteoarthrosis of hand 12/28/2017   Right-sided Bell's palsy 02/02/2016   Spondylolisthesis of lumbar region 08/25/2015   Type 2 diabetes mellitus with stage 4 chronic  kidney disease, without long-term current use of insulin (HCC)    History of brain disorder 11/05/2014   Depression, major, recurrent, moderate (HCC) 11/05/2014   Generalized anxiety disorder 10/16/2014   Hypertension associated with diabetes (HCC) 10/14/2014   Epilepsy (HCC) 10/14/2014   Migraine 10/14/2014   Dysthymic disorder 10/14/2014   Dyspepsia 10/14/2014   Hematochezia 10/14/2014   Urge incontinence 10/14/2014   Alopecia 09/10/2014   Blood in feces 09/10/2014   Arthralgia of hip 09/10/2014   Snapping thumb syndrome 04/19/2014   Eddie Good Quervain's disease (radial styloid tenosynovitis) 03/08/2014   Hyperlipidemia associated with type 2 diabetes mellitus (HCC) 01/15/2013   Heart palpation 01/15/2013    Congestive heart failure (HCC) 11/13/2009   HYPERCALCEMIA 05/08/2007   Clinical depression 10/20/2006   Obstructive apnea 03/22/2006    ONSET DATE: 10/02/2022  REFERRING DIAG: G20.A2 (ICD-10-CM) - Parkinson's disease with fluctuating manifestations, unspecified whether dyskinesia present (HCC)   THERAPY DIAG:  No diagnosis found.  Rationale for Evaluation and Treatment: Rehabilitation  SUBJECTIVE:                                                                                                                                                                                             SUBJECTIVE STATEMENT:  Pt was diagnosed with Parkinson's back in June of 2024.  Pt noted that she was experiencing tremors prior to that, but the formal diagnosis happened in June.  Pt is seeking therapy in order to be able to control her hands and to improve her balance.     Pt accompanied by: significant other  PERTINENT HISTORY:  PMH: Hypertension associated with diabetes, Hyperlipidemia associated with type 2 diabetes mellitus, Type 2 diabetes mellitus with stage 4 chronic kidney disease, without long-term current use of insulin, CKD (chronic kidney disease) stage 4, GFR 15-29 ml/min (HCC), Obesity, anemia,  Parkinson's disease with fluctuating manifestations, unspecified whether dyskinesia present, hx of gout in L great toe and Achille's tendonitis  PAIN:  Are you having pain? Yes, but only where she received the pneumonia shot.    PRECAUTIONS: None  RED FLAGS: None   WEIGHT BEARING RESTRICTIONS: No  FALLS: Has patient fallen in last 6 months? No  LIVING ENVIRONMENT: Lives with: lives with their family Lives in: House/apartment Stairs: Yes: External: 1 on the front , 5 steps on the back steps; can reach both on the back of the house Has following equipment at home: Single point cane and Walker - 2 wheeled  PLOF: Independent  PATIENT GOALS: Pt is hoping to be able to control her hands  better and to be able to ambulate without fear  of losing balance.  OBJECTIVE:  Note: Objective measures were completed at Evaluation unless otherwise noted.  DIAGNOSTIC FINDINGS: N/A  COGNITION: Overall cognitive status: Within functional limits for tasks assessed   SENSATION: WFL  COORDINATION: WFL   POSTURE: rounded shoulders, forward head, and anterior pelvic tilt  LOWER EXTREMITY MMT:    MMT Right Eval Left Eval  Hip flexion 4+ 4+  Hip extension    Hip abduction 4+ 4+  Hip adduction 4+ 4+  Hip internal rotation    Hip external rotation    Knee flexion 4+ 4+  Knee extension 4+ 4+  Ankle dorsiflexion 5 5  Ankle plantarflexion    Ankle inversion    Ankle eversion    (Blank rows = not tested)  BED MOBILITY:  Not tested  TRANSFERS: Sit to stand: Complete Independence  Assistive device utilized: None     Stand to sit: Complete Independence  Assistive device utilized: None     Chair to chair: Complete Independence  Assistive device utilized: None       RAMP:  Not tested  CURB:  Not tested  STAIRS: Findings: Level of Assistance: Complete Independence, Stair Negotiation Technique: Alternating Pattern  Forwards with Bilateral Rails, Number of Stairs: 8, Height of Stairs: 6"   , and Comments: Pt performed well with using singular UE to support self at times, and bilateral on the final step descending.  GAIT: Findings: Pt with good gait mechanics during short distance walking, just slowed performance.  FUNCTIONAL TESTS:  5 times sit to stand: 12.69 sec with one instance of instability back into the chair. Timed up and go (TUG): 11.69 sec 6 minute walk test: TBD at next session 10 meter walk test: 10.15 sec/ 0.98 m/s Dynamic Gait Index: TBD at next session SLS on R LE: 9.81 sec; L LE: 17.82 sec  PATIENT SURVEYS:  ABC scale 65.6%                                                                                                                               TREATMENT DATE: 09/18/2023  Physical Performance Test or Measurement: a physical performance test or measurement (eg, musculoskeletal, functional capacity), with written report,each 15 mins (Reassessment Test and Measures   6 Min Walk Test:  Instructed patient to ambulate as quickly and as safely as possible for 6 minutes using LRAD. Patient was allowed to take standing rest breaks without stopping the test, but if the patient required a sitting rest break the clock would be stopped and the test would be over.  Results: *** feet (*** meters, Avg speed ***m/s) using a *** with ***. Results indicate that the patient has reduced endurance with ambulation compared to age matched norms.  Age Matched Norms: 19-69 yo M: 85 F: 34, 33-79 yo M: 3 F: 471, 110-89 yo M: 417 F: 392 MDC: 58.21 meters (190.98 feet) or 50 meters (ANPTA Core Set of Outcome Measures for Adults with Neurologic  Conditions, 2018)   DGI   PATIENT EDUCATION: Education details: Pt educated on role of PT and services provided during current POC, along with prognosis and information about the clinic. Person educated: Patient and Spouse Education method: Explanation Education comprehension: verbalized understanding  HOME EXERCISE PROGRAM:  To be established at the next visit and given to pt.  GOALS: Goals reviewed with patient? Yes  SHORT TERM GOALS: Target date: 10/26/2023  Pt will be independent with HEP in order to demonstrate increased ability to perform tasks related to occupation/hobbies. Baseline:  Pt to be given HEP at next visit. Goal status: INITIAL   LONG TERM GOALS: Target date: 12/07/2023  1.  Patient (> 68 years old) will complete five times sit to stand test in <10 seconds indicating an increased LE strength and improved balance. Baseline: 12.69 sec Goal status: INITIAL  2.  Patient will increase ABC score to improve by 8%   to demonstrate statistically significant improvement in mobility and quality of  life.  Baseline: 65.6% Goal status: INITIAL   3.  Patient will increase DGI score by > ___ points to demonstrate decreased fall risk during functional activities. Baseline: TBD at next visit Goal status: INITIAL   4.  Patient will reduce timed up and go to <11 seconds to reduce fall risk and demonstrate improved transfer/gait ability. Baseline: 11.69 sec Goal status: INITIAL  5.  Patient will increase 10 meter walk test to >1.25m/s as to improve gait speed for better community ambulation and to reduce fall risk. Baseline: 0.98 m/s Goal status: INITIAL  6.  Patient will increase six minute walk test distance to >1000 for progression to community ambulator and improve gait ability Baseline: TBD at next visit. Goal status: INITIAL    ASSESSMENT:  CLINICAL IMPRESSION: Patient is a 63 y.o. female who was seen today for physical therapy evaluation and treatment for Parkinson's.  Pt current demonstrates weakness in the LE's and self reported balance deficits that would benefit from skilled PT intervention to improve.  Pt mobilizes well, however would benefit from skilled therapy to improve overall confidence, gait speed, and balance with everyday tasks.  Pt demonstrated good prognosis to improve those deficits and also wants to improve UE usage.   Pt will continue to benefit from skilled therapy to address remaining deficits in order to improve overall QoL and return to PLOF.     OBJECTIVE IMPAIRMENTS: Abnormal gait, decreased activity tolerance, decreased balance, decreased endurance, decreased knowledge of condition, decreased mobility, difficulty walking, decreased strength, impaired perceived functional ability, and impaired UE functional use.   ACTIVITY LIMITATIONS: carrying, lifting, squatting, reach over head, and locomotion level  PARTICIPATION LIMITATIONS: shopping, community activity, occupation, and yard work  PERSONAL FACTORS: Age, Past/current experiences, Time since onset of  injury/illness/exacerbation, and 3+ comorbidities: CKD, Anemia, clinical depression, tremor, epilepsy, DM2, Migraines, CHF, HTN are also affecting patient's functional outcome.   REHAB POTENTIAL: Good  CLINICAL DECISION MAKING: Evolving/moderate complexity  EVALUATION COMPLEXITY: Moderate  PLAN:  PT FREQUENCY: 1-2x/week  PT DURATION: 12 weeks  PLANNED INTERVENTIONS: 97750- Physical Performance Testing, 97110-Therapeutic exercises, 97530- Therapeutic activity, 97112- Neuromuscular re-education, 97535- Self Care, 16109- Manual therapy, [phone]- Gait training, Vestibular training, and DME instructions  PLAN FOR NEXT SESSION:  Establish HEP DGI and 6 Minute Walk Test   Ossie Blend, PT Physical Therapist - [facility]  09/18/23, 11:20 PM

## 2023-09-19 ENCOUNTER — Other Ambulatory Visit: Payer: Self-pay | Admitting: Allergy & Immunology

## 2023-09-19 ENCOUNTER — Ambulatory Visit

## 2023-09-20 ENCOUNTER — Telehealth: Payer: Self-pay | Admitting: Family Medicine

## 2023-09-20 NOTE — Telephone Encounter (Signed)
 Pt states that she spoke with Selina Dale and Dr. B at her physical appt on 09/08/2023 about sending an order to [facility] for disposable underwear and chucks.  She states that she spoke with [facility] and they have not received anything.  She would like an order sent to [facility] and a call to let her know when this is done.

## 2023-09-20 NOTE — Telephone Encounter (Signed)
 Ok to send DME order to [facility] as requested. Dx urinary incontinence.

## 2023-09-21 ENCOUNTER — Ambulatory Visit: Admitting: Physical Therapy

## 2023-09-22 ENCOUNTER — Other Ambulatory Visit: Payer: Self-pay

## 2023-09-22 MED ORDER — FREESTYLE LIBRE 3 PLUS SENSOR MISC: 5 refills | Status: DC

## 2023-09-22 NOTE — Telephone Encounter (Signed)
 Copied from CRM [id number]. Topic: Clinical - Order For Equipment >> Sep 22, 2023  9:22 AM Turkey B wrote: Pt called in requesting a freestyle libre 3

## 2023-09-23 ENCOUNTER — Telehealth: Payer: Self-pay

## 2023-09-23 ENCOUNTER — Ambulatory Visit (INDEPENDENT_AMBULATORY_CARE_PROVIDER_SITE_OTHER): Admitting: Clinical

## 2023-09-23 ENCOUNTER — Other Ambulatory Visit (HOSPITAL_COMMUNITY): Payer: Self-pay

## 2023-09-23 DIAGNOSIS — F330 Major depressive disorder, recurrent, mild: Secondary | ICD-10-CM

## 2023-09-23 DIAGNOSIS — F411 Generalized anxiety disorder: Secondary | ICD-10-CM | POA: Diagnosis not present

## 2023-09-23 NOTE — Progress Notes (Signed)
   Abigail Barthel, LCSW

## 2023-09-23 NOTE — Telephone Encounter (Signed)
 Pharmacy Patient Advocate Encounter   Received notification from Onbase that prior authorization for FreeStyle Libre 3 Plus Sensor is required/requested.   Insurance verification completed.   The patient is insured through [employer] .   Per test claim: PA required; PA submitted to above mentioned insurance via CoverMyMeds Key/confirmation #/EOC [id number] Status is pending

## 2023-09-23 NOTE — Progress Notes (Signed)
 [facility] Counselor/Therapist Progress Note  Patient ID: Abigail Figueroa, MRN: [id number],    Date: 09/23/2023  Time Spent: 12:35pm - 1:22pm : 47 minutes   Treatment Type: Individual Therapy  Reported Symptoms: depressed mood at times, decreased concentration at times  Mental Status Exam: Appearance:  Neat and Well Groomed     Behavior: Appropriate  Motor: Normal  Speech/Language:  Clear and Coherent and Normal Rate  Affect: Appropriate  Mood: normal  Thought process: normal  Thought content:   WNL  Sensory/Perceptual disturbances:   WNL  Orientation: oriented to person, place, time/date, and situation  Attention: Good  Concentration: Good  Memory: WNL  Fund of knowledge:  Good  Insight:   Good  Judgment:  Good  Impulse Control: Good   Risk Assessment: Danger to Self:  No Patient denied current suicidal ideation  Self-injurious Behavior: No Danger to Others: No Patient denied current homicidal ideation Duty to Warn:no Physical Aggression / Violence:No  Access to Firearms a concern: No  Gang Involvement:No   Subjective: Patient stated, "they've been going pretty good", "I've been going through some stuff with my health" in response to events since last session. Patient reported she has been experiencing decreases in patient's glucose levels and reported she is wearing a device to monitor patient's glucose levels. Patient stated, "its been ok",  "its been some up and down days" in reference to patient's mood since last session. Patient reported she has been working in the family's garden and stated, "its a happy moment, a happy time" in reference to working in the family's garden. Patient reported she has been spending more time outside and stated, "that benefits me, it makes me feel good, I feel free" in reference to time spent outside. Patient reported she has been going outside, walking around outside, talking to daughter or husband when patient experiences  a decline in mood. Patient reported she wakes up in a depressed mood at times. Patient reported experiencing flashbacks and thoughts regarding home repairs and finances when mood is depressed. Patient expressed appreciation for information regarding local resources.     Interventions: Cognitive Behavioral Therapy and problem solving. Clinician conducted session via caregility video from clinician's home office. Patient provided verbal consent to proceed with telehealth session and is aware of limitations of telephone or video visits. Patient participated in session from patient's home. Reviewed events since last session and assessed for changes. Discussed patient's recent activities and the impact on patient's mood. Assessed symptoms patient experiences when decline in mood occurs and discussed coping strategies patient utilizes in response. Explored and identified triggers for depressive symptoms. Discussed local community resources related to financial concerns and provided information for local resources.   Collaboration of Care: not required at this time   Diagnosis:  Mild episode of recurrent major depressive disorder (HCC)    Generalized anxiety disorder   Plan: Patient is to utilize [employer] Therapy, thought re-framing, relaxation techniques, mindfulness, effective communication, and coping strategies to decrease symptoms associated with Major Depressive Disorder and Generalized Anxiety Disorder. Frequency: bi-weekly  Modality: individual      Long-term goal:   Patient stated, "I want to see a change in the anxiety".      Reduce overall level, frequency, and intensity of the symptoms of anxiety and depression as evidenced by decreased sadness, feelings of anxiety, feeling "lost", lack of energy, difficulty falling and staying asleep, changes in concentration, psychomotor retardation, rapid heart rate, excessive worry, restlessness, feeling jittery, and feeling on edge  from 5 to  6 days per week to 0 to 1 days per week per patient reported for at least 3 consecutive months.   Target Date: 09/11/24  Progress: progressing    Short-term goal:  Develop coping strategies to utilize in response to symptoms of depression/anxiety and stressors  Target Date: 09/11/24  Progress: progressing    Verbally express patient's thoughts and feelings to others and utilize effective communication strategies when expressing patient's thoughts/feelings    Target Date: 09/11/24  Progress: progressing    Verbalize an understanding of the relationship between symptoms of anxiety and the impact on patient's thought patterns and behaviors    Target Date: 09/11/24  Progress: progressing    Develop strategies for patient to utilize to support patient's daughter and grandson as it relates to grandson using illegal substances.  Target Date: 09/11/24  Progress: established 09/12/23    Develop and implement coping strategies for patient to utilize in response to grandson using illegal substances Target Date: 09/11/24  Progress: established 09/12/23     Burlene Carpen, LCSW

## 2023-09-27 ENCOUNTER — Ambulatory Visit: Admitting: Physical Therapy

## 2023-09-27 LAB — GLUCOSE, POCT (MANUAL RESULT ENTRY)
POC Glucose: 44 mg/dL — AB (ref 70–99)
POC Glucose: 44 mg/dL — AB (ref 70–99)

## 2023-09-27 NOTE — Telephone Encounter (Signed)
 Pharmacy Patient Advocate Encounter  Received notification from [facility] that Prior Authorization for FreeStyle Libre 3 Plus Sensor has been DENIED.  See denial reason below. No denial letter attached in CMM. Will attach denial letter to Media tab once received.   PA #/Case ID/Reference #:  [id number]

## 2023-09-27 NOTE — Telephone Encounter (Signed)
 Rx signed.

## 2023-09-27 NOTE — Telephone Encounter (Signed)
 Ok to let patient know. She can check with [facility] and see if they can get this approved.

## 2023-09-28 DIAGNOSIS — M791 Myalgia, unspecified site: Secondary | ICD-10-CM | POA: Diagnosis not present

## 2023-09-28 DIAGNOSIS — M542 Cervicalgia: Secondary | ICD-10-CM | POA: Diagnosis not present

## 2023-09-28 DIAGNOSIS — G43719 Chronic migraine without aura, intractable, without status migrainosus: Secondary | ICD-10-CM | POA: Diagnosis not present

## 2023-09-29 ENCOUNTER — Telehealth: Payer: Self-pay | Admitting: Pharmacist

## 2023-09-29 DIAGNOSIS — N184 Chronic kidney disease, stage 4 (severe): Secondary | ICD-10-CM | POA: Diagnosis not present

## 2023-09-29 DIAGNOSIS — N179 Acute kidney failure, unspecified: Secondary | ICD-10-CM | POA: Diagnosis not present

## 2023-09-29 DIAGNOSIS — E1122 Type 2 diabetes mellitus with diabetic chronic kidney disease: Secondary | ICD-10-CM | POA: Diagnosis not present

## 2023-09-29 DIAGNOSIS — D631 Anemia in chronic kidney disease: Secondary | ICD-10-CM | POA: Diagnosis not present

## 2023-09-29 DIAGNOSIS — E162 Hypoglycemia, unspecified: Secondary | ICD-10-CM

## 2023-09-29 NOTE — Telephone Encounter (Signed)
 Appeal has been submitted for FreeStyle Libre 3 Plus Sensor. Will advise when response is received, please be advised that most companies may take 30 days to make a decision. Appeal letter and supporting documentation have been faxed to [phone] on 09/29/2023 @2 :45 pm.  Thank you, Dene Fines, PharmD Clinical Pharmacist  [facility]  Direct Dial : [phone]

## 2023-09-30 ENCOUNTER — Inpatient Hospital Stay

## 2023-09-30 VITALS — BP 121/66

## 2023-09-30 DIAGNOSIS — D631 Anemia in chronic kidney disease: Secondary | ICD-10-CM

## 2023-09-30 DIAGNOSIS — N184 Chronic kidney disease, stage 4 (severe): Secondary | ICD-10-CM | POA: Diagnosis not present

## 2023-09-30 LAB — HEMOGLOBIN AND HEMATOCRIT (CANCER CENTER ONLY)
HCT: 30.8 % — ABNORMAL LOW (ref 36.0–46.0)
Hemoglobin: 9.7 g/dL — ABNORMAL LOW (ref 12.0–15.0)

## 2023-09-30 MED ORDER — EPOETIN ALFA-EPBX 20000 UNIT/ML IJ SOLN
20000.0000 [IU] | Freq: Once | INTRAMUSCULAR | Status: AC
  Administered 2023-09-30: 20000 [IU] via SUBCUTANEOUS
  Filled 2023-09-30: qty 1

## 2023-10-03 ENCOUNTER — Ambulatory Visit: Admitting: Physical Therapy

## 2023-10-04 ENCOUNTER — Other Ambulatory Visit: Payer: Self-pay | Admitting: Cardiovascular Disease

## 2023-10-05 NOTE — Telephone Encounter (Signed)
 Additional information form completed and faxed back to [phone]

## 2023-10-06 ENCOUNTER — Ambulatory Visit: Attending: Family Medicine | Admitting: Physical Therapy

## 2023-10-06 DIAGNOSIS — D631 Anemia in chronic kidney disease: Secondary | ICD-10-CM | POA: Diagnosis not present

## 2023-10-06 DIAGNOSIS — N184 Chronic kidney disease, stage 4 (severe): Secondary | ICD-10-CM | POA: Diagnosis not present

## 2023-10-06 DIAGNOSIS — N1832 Chronic kidney disease, stage 3b: Secondary | ICD-10-CM | POA: Diagnosis not present

## 2023-10-06 DIAGNOSIS — E1122 Type 2 diabetes mellitus with diabetic chronic kidney disease: Secondary | ICD-10-CM | POA: Diagnosis not present

## 2023-10-06 DIAGNOSIS — I10 Essential (primary) hypertension: Secondary | ICD-10-CM | POA: Diagnosis not present

## 2023-10-07 ENCOUNTER — Other Ambulatory Visit (HOSPITAL_COMMUNITY): Payer: Self-pay

## 2023-10-07 ENCOUNTER — Ambulatory Visit: Admitting: Clinical

## 2023-10-07 DIAGNOSIS — F330 Major depressive disorder, recurrent, mild: Secondary | ICD-10-CM

## 2023-10-07 DIAGNOSIS — F411 Generalized anxiety disorder: Secondary | ICD-10-CM | POA: Diagnosis not present

## 2023-10-07 NOTE — Progress Notes (Signed)
 [facility] Counselor/Therapist Progress Note  Patient ID: Abigail Figueroa, MRN: [id number],    Date: 10/07/2023  Time Spent: 12:35pm - 1:33pm : 58 minutes   Treatment Type: Individual Therapy  Reported Symptoms: none reported  Mental Status Exam: Appearance:  Neat and Well Groomed     Behavior: Appropriate  Motor: Normal  Speech/Language:  Clear and Coherent and Normal Rate  Affect: Appropriate  Mood: normal  Thought process: normal  Thought content:   WNL  Sensory/Perceptual disturbances:   WNL  Orientation: oriented to person, place, time/date, and situation  Attention: Good  Concentration: Good  Memory: WNL  Fund of knowledge:  Good  Insight:   Good  Judgment:  Good  Impulse Control: Good   Risk Assessment: Danger to Self:  No Patient denied current suicidal ideation  Self-injurious Behavior: No Danger to Others: No Patient denied current homicidal ideation Duty to Warn:no Physical Aggression / Violence:No  Access to Firearms a concern: No  Gang Involvement:No   Subjective: Patient stated, "going pretty good" in response to events since last session. Patient reported she reached out to patient's previous Medicaid caseworker at [employer] to inquire about MQB medicaid coverage and has not received a response as of yet. Patient reported recent change in patient's insurance coverage and patient reported she is now required to pay a medicare premium. Patient reported the change in coverage/medicare premium has impacted patient's finances and reported change in finances are a current stressor. Patient reported finances are a barrier to patient's participation in physical therapy at the recommended frequency of 2-3 times per week. Patient reported multiple medical appointments last week. Patient reported she receives financial assistance through [facility] for medical costs associated with [facility] providers. Patient stated, "today's been a good day".  Patient reported she has been involved in the family's garden, attended several graduation events, spending time outside when it's not raining and stated, "that's a picker upper" in reference to mood.    Interventions: Cognitive Behavioral Therapy and problem solving, DBT skills. Clinician conducted session via caregility video from clinician's home office. Patient provided verbal consent to proceed with telehealth session and is aware of limitations of telephone or video visits. Patient participated in session from patient's home. Reviewed events since last session and assessed for changes. Reviewed financial resources discussed during previous session and the outcome. Discussed financial stressors and the impact of stressors. Discussed additional resources regarding financial assistance and provided information for local resources. Discussed activities patient has participated in to support stability in mood.  Provided psycho education related to opposite action skill. Clinician requested for homework patient practice opposite action skill.    Collaboration of Care: not required at this time   Diagnosis:  Mild episode of recurrent major depressive disorder (HCC)    Generalized anxiety disorder   Plan: Patient is to utilize [employer] Therapy, thought re-framing, relaxation techniques, mindfulness, effective communication, and coping strategies to decrease symptoms associated with Major Depressive Disorder and Generalized Anxiety Disorder. Frequency: bi-weekly  Modality: individual      Long-term goal:   Patient stated, "I want to see a change in the anxiety".      Reduce overall level, frequency, and intensity of the symptoms of anxiety and depression as evidenced by decreased sadness, feelings of anxiety, feeling "lost", lack of energy, difficulty falling and staying asleep, changes in concentration, psychomotor retardation, rapid heart rate, excessive worry, restlessness, feeling  jittery, and feeling on edge from 5 to 6 days per week  to 0 to 1 days per week per patient reported for at least 3 consecutive months.   Target Date: 09/11/24  Progress: progressing    Short-term goal:  Develop coping strategies to utilize in response to symptoms of depression/anxiety and stressors  Target Date: 09/11/24  Progress: progressing    Verbally express patient's thoughts and feelings to others and utilize effective communication strategies when expressing patient's thoughts/feelings    Target Date: 09/11/24  Progress: progressing    Verbalize an understanding of the relationship between symptoms of anxiety and the impact on patient's thought patterns and behaviors    Target Date: 09/11/24  Progress: progressing    Develop strategies for patient to utilize to support patient's daughter and grandson as it relates to grandson using illegal substances.  Target Date: 09/11/24  Progress: established 09/12/23    Develop and implement coping strategies for patient to utilize in response to grandson using illegal substances Target Date: 09/11/24  Progress: established 09/12/23     Burlene Carpen, LCSW

## 2023-10-07 NOTE — Progress Notes (Signed)
   Doree Barthel, LCSW

## 2023-10-08 ENCOUNTER — Other Ambulatory Visit: Payer: Self-pay | Admitting: Physician Assistant

## 2023-10-08 DIAGNOSIS — R229 Localized swelling, mass and lump, unspecified: Secondary | ICD-10-CM

## 2023-10-09 ENCOUNTER — Other Ambulatory Visit: Payer: Self-pay | Admitting: Family Medicine

## 2023-10-09 DIAGNOSIS — J302 Other seasonal allergic rhinitis: Secondary | ICD-10-CM

## 2023-10-10 ENCOUNTER — Other Ambulatory Visit (HOSPITAL_COMMUNITY): Payer: Self-pay

## 2023-10-11 ENCOUNTER — Other Ambulatory Visit: Payer: Self-pay

## 2023-10-11 ENCOUNTER — Telehealth: Payer: Self-pay

## 2023-10-11 DIAGNOSIS — E162 Hypoglycemia, unspecified: Secondary | ICD-10-CM

## 2023-10-11 MED ORDER — FREESTYLE LIBRE 3 PLUS SENSOR MISC: 0 refills | Status: DC

## 2023-10-11 NOTE — Telephone Encounter (Signed)
 Ok to give sample and can check on PA with PA team

## 2023-10-11 NOTE — Telephone Encounter (Signed)
 Copied from CRM [id number]. Topic: Referral - Prior Authorization Question >> Oct 11, 2023  1:13 PM Chrystal Crape R wrote: Pt would like to know if the 2nd prior auth has been sent for her Abigail Figueroa plus 3 sensory. Pt only has 6 more days of sample please let her know if there is access to more until this is taken care of.

## 2023-10-11 NOTE — Telephone Encounter (Signed)
 Patient husband in office to pick up samples.  Samples provided and husband advised additional information was sent to be reviewed on 10/05/23 (please see telephone encounter from 09/29/23)

## 2023-10-12 ENCOUNTER — Ambulatory Visit: Admitting: Physical Therapy

## 2023-10-12 DIAGNOSIS — G20C Parkinsonism, unspecified: Secondary | ICD-10-CM | POA: Diagnosis not present

## 2023-10-12 DIAGNOSIS — R413 Other amnesia: Secondary | ICD-10-CM | POA: Diagnosis not present

## 2023-10-12 NOTE — Telephone Encounter (Signed)
 Copied from CRM [id number]. Topic: Clinical - Medication Prior Auth >> Oct 12, 2023  9:51 AM Juluis Ok wrote: Reason for CRM: Att: Shay- Patient's spouse,Tony, requesting that the PA for the [facility] 3 plus be resubmitted.  [facility] prior authorization department [phone]

## 2023-10-14 ENCOUNTER — Ambulatory Visit

## 2023-10-19 ENCOUNTER — Other Ambulatory Visit (HOSPITAL_COMMUNITY): Payer: Self-pay

## 2023-10-21 ENCOUNTER — Ambulatory Visit

## 2023-10-21 ENCOUNTER — Ambulatory Visit: Admitting: Clinical

## 2023-10-21 DIAGNOSIS — F411 Generalized anxiety disorder: Secondary | ICD-10-CM

## 2023-10-21 DIAGNOSIS — F330 Major depressive disorder, recurrent, mild: Secondary | ICD-10-CM

## 2023-10-21 NOTE — Progress Notes (Signed)
   Doree Barthel, LCSW

## 2023-10-21 NOTE — Progress Notes (Signed)
 [facility] Counselor/Therapist Progress Note  Patient ID: Abigail Figueroa, MRN: [id number],    Date: 10/21/2023  Time Spent: 12:37pm - 1:27pm : 50 minutes   Treatment Type: Individual Therapy  Reported Symptoms: recent feelings of anger  Mental Status Exam: Appearance:  Neat and Well Groomed     Behavior: Appropriate  Motor: Normal  Speech/Language:  Clear and Coherent and Normal Rate  Affect: Appropriate  Mood: normal  Thought process: normal  Thought content:   WNL  Sensory/Perceptual disturbances:   WNL  Orientation: oriented to person, place, time/date, and situation  Attention: Good  Concentration: Good  Memory: WNL  Fund of knowledge:  Good  Insight:   Good  Judgment:  Good  Impulse Control: Good   Risk Assessment: Danger to Self:  No Patient denied current suicidal ideation  Self-injurious Behavior: No Danger to Others: No Patient denied current homicidal ideation Duty to Warn:no Physical Aggression / Violence:No  Access to Firearms a concern: No  Gang Involvement:No   Subjective: Patient stated, it's been going pretty good in response to events since last session.  Patient stated, my depression has been a little low, yesterday was a bad day. Patient stated, yesterday was just one of those days. Patient stated, It hasn't been that bad in response to patient's mood since last session. Patient reported feeling angry regarding household repairs to be completed. Patient reported patient's home is currently being treated for mold/mildew and patient reported the repair costs are a current stressor. Patient stated, I've been sort of angry. Patient stated, I've been trying to do this opposite (opposite action) but I need help with that. Patient reported feelings of anger in regard to husband's previous financial decisions. Patient reported feeling patient's husband did not prioritize household repairs when the income was available. Patient reported  feeling household repairs are a priority. Patient reported patient's husband has been seeking resources to assist with the mold/mildew repairs. Patient stated, I appreciate him doing that in reference to husband seeking resources and stated, I feel better that he's doing it. Patient reported experiencing anger when patient is outside looking at the household repairs that have not been completed. Patient stated, its pretty good today in response to current mood. Patient reported patient has been outside several times times today.   Interventions: Cognitive Behavioral Therapy and problem solving. Clinician conducted 33 minutes of session via caregility video and 17 minutes via telephone from clinician's home office due to loss of connection via caregility. Patient provided verbal consent to proceed with telehealth session and is aware of limitations of telephone or video visits. Patient participated in session from patient's home. Reviewed events since last session and assessed for changes. Explored triggers for recent feelings of anger. Explored and identified thoughts associated with feelings of anger. Explored differences in patient/husband's decisions/priorities regarding financial decisions. Discussed patient's implementation of opposite action and reviewed use of opposite action skill. Explored and identified additional resources regarding assistance with home repairs and provided information for local resources. Clinician requested for homework patient practice opposite action skill.    Collaboration of Care: not required at this time   Diagnosis:  Mild episode of recurrent major depressive disorder (HCC)    Generalized anxiety disorder   Plan: Patient is to utilize [employer] Therapy, thought re-framing, relaxation techniques, mindfulness, effective communication, and coping strategies to decrease symptoms associated with Major Depressive Disorder and Generalized Anxiety  Disorder. Frequency: bi-weekly  Modality: individual      Long-term goal:  Patient stated, I want to see a change in the anxiety.      Reduce overall level, frequency, and intensity of the symptoms of anxiety and depression as evidenced by decreased sadness, feelings of anxiety, feeling lost, lack of energy, difficulty falling and staying asleep, changes in concentration, psychomotor retardation, rapid heart rate, excessive worry, restlessness, feeling jittery, and feeling on edge from 5 to 6 days per week to 0 to 1 days per week per patient reported for at least 3 consecutive months.   Target Date: 09/11/24  Progress: progressing    Short-term goal:  Develop coping strategies to utilize in response to symptoms of depression/anxiety and stressors  Target Date: 09/11/24  Progress: progressing    Verbally express patient's thoughts and feelings to others and utilize effective communication strategies when expressing patient's thoughts/feelings    Target Date: 09/11/24  Progress: progressing    Verbalize an understanding of the relationship between symptoms of anxiety and the impact on patient's thought patterns and behaviors    Target Date: 09/11/24  Progress: progressing    Develop strategies for patient to utilize to support patient's daughter and grandson as it relates to grandson using illegal substances.  Target Date: 09/11/24  Progress: established 09/12/23    Develop and implement coping strategies for patient to utilize in response to grandson using illegal substances Target Date: 09/11/24  Progress: established 09/12/23     Burlene Carpen, LCSW

## 2023-10-26 ENCOUNTER — Ambulatory Visit

## 2023-10-27 ENCOUNTER — Ambulatory Visit: Payer: Self-pay

## 2023-10-27 NOTE — Telephone Encounter (Signed)
 Copied from CRM [id number]. Topic: General - Other >> Oct 27, 2023 11:22 AM Edsel HERO wrote: Patient is requesting a callback from Dr. Donnald nurse about patient's [employer].

## 2023-10-27 NOTE — Telephone Encounter (Signed)
 FYI Only or Action Required?: Action required by provider: clinical question for provider.  Patient was last seen in primary care on 09/08/2023 by [name], MD. Called Nurse Triage reporting DME. Symptoms began NA. Interventions attempted: Other: NA. Symptoms are: unchanged.  Triage Disposition: Call PCP Within 24 Hours, Callback by PCP Today  Patient/caregiver understands and will follow disposition?:  Reason for Disposition  [1] Follow-up call from patient regarding patient's clinical status AND [2] information NON-URGENT  Answer Assessment - Initial Assessment Questions 1. REASON FOR CALL or QUESTION: What is your reason for calling today? or How can I best help you? or What question do you have that I can help answer?     Prior shara was declined. Appeal sent. Calling for update. Read note that appeals can take 30 days and awaiting response. Family would like call back from prior auth team for more information such as what date did the 30 days start. She will be out of samples given.  2. CALLER: Document the source of call. (e.g., laboratory, patient).    Very pleasant patient and her husband  Protocols used: PCP Call - No Triage-A-AH

## 2023-10-27 NOTE — Telephone Encounter (Signed)
            First attempt; no answer; left vm   Pt calling regarding [employer]. Rosas, Joseline E, CMA, ask that E2C2 gather information on what questions/concerns do pt have. Thanks. Please call pt at [phone].

## 2023-10-27 NOTE — Telephone Encounter (Signed)
 Called patient and she said to talk to her husband. Patient's husband said he will call back. Please E2C2 gather information on what questions/concerns do they have. Thanks.

## 2023-10-28 ENCOUNTER — Inpatient Hospital Stay: Attending: Oncology

## 2023-10-28 ENCOUNTER — Ambulatory Visit: Admitting: Clinical

## 2023-10-28 ENCOUNTER — Inpatient Hospital Stay

## 2023-10-28 DIAGNOSIS — N184 Chronic kidney disease, stage 4 (severe): Secondary | ICD-10-CM | POA: Diagnosis not present

## 2023-10-28 DIAGNOSIS — D631 Anemia in chronic kidney disease: Secondary | ICD-10-CM | POA: Insufficient documentation

## 2023-10-28 LAB — HEMOGLOBIN AND HEMATOCRIT (CANCER CENTER ONLY)
HCT: 32.4 % — ABNORMAL LOW (ref 36.0–46.0)
Hemoglobin: 10.3 g/dL — ABNORMAL LOW (ref 12.0–15.0)

## 2023-10-28 NOTE — Progress Notes (Signed)
 Hgb 10.3, no epo inj today

## 2023-11-01 ENCOUNTER — Ambulatory Visit (INDEPENDENT_AMBULATORY_CARE_PROVIDER_SITE_OTHER): Admitting: Family Medicine

## 2023-11-01 ENCOUNTER — Ambulatory Visit: Admitting: Physical Therapy

## 2023-11-01 DIAGNOSIS — F331 Major depressive disorder, recurrent, moderate: Secondary | ICD-10-CM

## 2023-11-01 DIAGNOSIS — R569 Unspecified convulsions: Secondary | ICD-10-CM

## 2023-11-01 MED ORDER — CARBAMAZEPINE ER 400 MG PO TB12
400.0000 mg | ORAL_TABLET | Freq: Two times a day (BID) | ORAL | 1 refills | Status: DC
Start: 2023-11-01 — End: 2024-01-23

## 2023-11-01 NOTE — Progress Notes (Signed)
 Chief Complaint  Patient presents with   Medication Refill   Medication Assistance    Patient reports when she speaks with insurance they are still advising no PA has been received for her freestyle libre 3 and she is currently using her last one. Letter received states appeal can take up to 65 days to be proccessed and fax number on forms show different than what's on previous encounters   Refilled Carbamazepine .  She did see Neuro 10/12/23. She was confused and made this appt, but just wanted a refill and to provide info re: PA.  I did not see the patient. I just filled the medication. CMA will update PA team.

## 2023-11-02 DIAGNOSIS — H47233 Glaucomatous optic atrophy, bilateral: Secondary | ICD-10-CM | POA: Diagnosis not present

## 2023-11-02 DIAGNOSIS — H469 Unspecified optic neuritis: Secondary | ICD-10-CM | POA: Diagnosis not present

## 2023-11-02 DIAGNOSIS — E559 Vitamin D deficiency, unspecified: Secondary | ICD-10-CM | POA: Diagnosis not present

## 2023-11-02 DIAGNOSIS — H18603 Keratoconus, unspecified, bilateral: Secondary | ICD-10-CM | POA: Diagnosis not present

## 2023-11-02 DIAGNOSIS — G20C Parkinsonism, unspecified: Secondary | ICD-10-CM | POA: Diagnosis not present

## 2023-11-07 ENCOUNTER — Other Ambulatory Visit (HOSPITAL_COMMUNITY): Payer: Self-pay

## 2023-11-07 ENCOUNTER — Ambulatory Visit: Attending: Family Medicine

## 2023-11-07 DIAGNOSIS — R2689 Other abnormalities of gait and mobility: Secondary | ICD-10-CM | POA: Insufficient documentation

## 2023-11-07 DIAGNOSIS — M6281 Muscle weakness (generalized): Secondary | ICD-10-CM | POA: Diagnosis not present

## 2023-11-07 DIAGNOSIS — R251 Tremor, unspecified: Secondary | ICD-10-CM | POA: Insufficient documentation

## 2023-11-07 DIAGNOSIS — R262 Difficulty in walking, not elsewhere classified: Secondary | ICD-10-CM | POA: Insufficient documentation

## 2023-11-07 NOTE — Therapy (Signed)
 OUTPATIENT PHYSICAL THERAPY NEURO TREATMENT   Patient Name: Abigail Figueroa MRN: [id number] DOB:Aug 26, 1960, 63 y.o., female Today's Date: 11/08/2023   PCP: [name], MD  REFERRING PROVIDER: [name], MD   END OF SESSION:   PT End of Session - 11/07/23 1536     Visit Number 2    Number of Visits 25    Date for PT Re-Evaluation 12/07/23    Authorization Type VL based on auth    Authorization Time Period 09/14/23-12/07/23    Progress Note Due on Visit 10    PT Start Time 1532    PT Stop Time 1613    PT Time Calculation (min) 41 min    Equipment Utilized During Treatment Gait belt    Activity Tolerance Patient tolerated treatment well    Behavior During Therapy [facility] for tasks assessed/performed          Past Medical History:  Diagnosis Date   Abnormal stress test    a. 10/2009: Normal myocardial perfusion imaging; b. 08/2015 MV: medium defect of mod severity in mid ant apical region w/ mild HK of the distal inf wall;  c. 08/2015 Cath: nl Cors, EF 60%.   Allergy     Not sure   Anemia    Angio-edema    Anxiety    Arthritis    Bell's palsy    CHF (congestive heart failure) (HCC)    Not sure   Chronic kidney disease 2024   Depression    Dermatitis 09/10/2014   Edema    Essential hypertension    Frequent urination    Frequent urination at night    Headache    migraines, gets botox injections   Heart palpitations 10/2011   Event monitor showing sinus tachycardia, PACs with couplets and triplets.   Hx of echocardiogram    a. 10/2009 Echo: showed normal left ventricular function, mild left ventricular hypertrophy, and no significant valve abnormalities.   Hypercholesterolemia    Migraine    Morbid obesity (HCC)    Neuromuscular disorder (HCC)    OSA (obstructive sleep apnea)    has been on continuous positive airway pressure   Seizure disorder (HCC)    Seizures (HCC)    Sleep apnea    Not sure   Type II diabetes mellitus (HCC)    Urticaria     Past Surgical History:  Procedure Laterality Date   ABDOMINAL HYSTERECTOMY  1990   without BSO   BACK SURGERY  1900s 2001   x2 with cage put in   BILATERAL SALPINGOOPHORECTOMY  1990   CARDIAC CATHETERIZATION N/A 08/06/2015   Procedure: Left Heart Cath and Coronary Angiography;  Surgeon: Debby DELENA Sor, MD;  Location: [facility];  Service: Cardiovascular;  Laterality: N/A;   CARPAL TUNNEL RELEASE Bilateral    CHOLECYSTECTOMY  1980   COLONOSCOPY WITH PROPOFOL  N/A 08/30/2017   Procedure: COLONOSCOPY WITH PROPOFOL ;  Surgeon: Unk Corinn Skiff, MD;  Location: [facility] ENDOS[facility];  Service: Endoscopy;  Laterality: N/A;   DG THUMB LEFT HAND  01/24/2018   repair joint in left thumb   DORSAL COMPARTMENT RELEASE Left 10/25/2014   Procedure: LEFT FIRST  DORSAL COMPARTMENT RELEASE AND RADIAL TENOSYNOVECTOMY ;  Surgeon: Elsie Mussel, MD;  Location: [facility];  Service: Orthopedics;  Laterality: Left;   HAND SURGERY     KNEE SURGERY Right 2012   meniscus tear   KNEE SURGERY  2012   LAMINECTOMY  1995   TUBAL LIGATION  1982  WRIST SURGERY Right    Patient Active Problem List   Diagnosis Date Noted   Seasonal allergies 07/25/2023   Gout involving toe of left foot 07/25/2023   Tremor 10/14/2022   Other chest pain 07/29/2022   Anemia in stage 4 chronic kidney disease (HCC) 07/19/2022   CKD (chronic kidney disease) stage 4, GFR 15-29 ml/min (HCC) 06/03/2022   Seasonal and perennial allergic rhinitis 10/11/2019   Angio-edema 08/30/2019   Obesity (BMI 30-39.9) 02/06/2019   Osteoarthrosis of hand 12/28/2017   Right-sided Bell's palsy 02/02/2016   Spondylolisthesis of lumbar region 08/25/2015   Type 2 diabetes mellitus with stage 4 chronic kidney disease, without long-term current use of insulin (HCC)    History of brain disorder 11/05/2014   Depression, major, recurrent, moderate (HCC) 11/05/2014   Generalized anxiety disorder 10/16/2014   Hypertension associated with  diabetes (HCC) 10/14/2014   Epilepsy (HCC) 10/14/2014   Migraine 10/14/2014   Dysthymic disorder 10/14/2014   Dyspepsia 10/14/2014   Hematochezia 10/14/2014   Urge incontinence 10/14/2014   Alopecia 09/10/2014   Blood in feces 09/10/2014   Arthralgia of hip 09/10/2014   Snapping thumb syndrome 04/19/2014   De Quervain's disease (radial styloid tenosynovitis) 03/08/2014   Hyperlipidemia associated with type 2 diabetes mellitus (HCC) 01/15/2013   Heart palpation 01/15/2013   Congestive heart failure (HCC) 11/13/2009   HYPERCALCEMIA 05/08/2007   Clinical depression 10/20/2006   Obstructive apnea 03/22/2006    ONSET DATE: 10/02/2022  REFERRING DIAG: G20.A2 (ICD-10-CM) - Parkinson's disease with fluctuating manifestations, unspecified whether dyskinesia present (HCC)   THERAPY DIAG:  Balance disorder  Difficulty in walking, not elsewhere classified  Muscle weakness (generalized)  Shakiness  Rationale for Evaluation and Treatment: Rehabilitation  SUBJECTIVE:                                                                                                                                                                                             SUBJECTIVE STATEMENT:   From Today: Patient reports doing okay- report daily walking.    From EVAL: Pt was diagnosed with Parkinson's back in June of 2024.  Pt noted that she was experiencing tremors prior to that, but the formal diagnosis happened in June.  Pt is seeking therapy in order to be able to control her hands and to improve her balance.     Pt accompanied by: significant other  PERTINENT HISTORY:  PMH: Hypertension associated with diabetes, Hyperlipidemia associated with type 2 diabetes mellitus, Type 2 diabetes mellitus with stage 4 chronic kidney disease, without long-term current use of insulin, CKD (chronic kidney disease) stage 4, GFR 15-29 ml/min (HCC),  Obesity, anemia,  Parkinson's disease with fluctuating  manifestations, unspecified whether dyskinesia present, hx of gout in L great toe and Achille's tendonitis  PAIN:  Are you having pain? Yes, but only where she received the pneumonia shot.    PRECAUTIONS: None  RED FLAGS: None   WEIGHT BEARING RESTRICTIONS: No  FALLS: Has patient fallen in last 6 months? No  LIVING ENVIRONMENT: Lives with: lives with their family Lives in: House/apartment Stairs: Yes: External: 1 on the front , 5 steps on the back steps; can reach both on the back of the house Has following equipment at home: Single point cane and Walker - 2 wheeled  PLOF: Independent  PATIENT GOALS: Pt is hoping to be able to control her hands better and to be able to ambulate without fear of losing balance.  OBJECTIVE:  Note: Objective measures were completed at Evaluation unless otherwise noted.  DIAGNOSTIC FINDINGS: N/A  COGNITION: Overall cognitive status: Within functional limits for tasks assessed   SENSATION: WFL  COORDINATION: WFL   POSTURE: rounded shoulders, forward head, and anterior pelvic tilt  LOWER EXTREMITY MMT:    MMT Right Eval Left Eval  Hip flexion 4+ 4+  Hip extension    Hip abduction 4+ 4+  Hip adduction 4+ 4+  Hip internal rotation    Hip external rotation    Knee flexion 4+ 4+  Knee extension 4+ 4+  Ankle dorsiflexion 5 5  Ankle plantarflexion    Ankle inversion    Ankle eversion    (Blank rows = not tested)  BED MOBILITY:  Not tested  TRANSFERS: Sit to stand: Complete Independence  Assistive device utilized: None     Stand to sit: Complete Independence  Assistive device utilized: None     Chair to chair: Complete Independence  Assistive device utilized: None       RAMP:  Not tested  CURB:  Not tested  STAIRS: Findings: Level of Assistance: Complete Independence, Stair Negotiation Technique: Alternating Pattern  Forwards with Bilateral Rails, Number of Stairs: 8, Height of Stairs: 6   , and Comments: Pt performed  well with using singular UE to support self at times, and bilateral on the final step descending.  GAIT: Findings: Pt with good gait mechanics during short distance walking, just slowed performance.  FUNCTIONAL TESTS:  5 times sit to stand: 12.69 sec with one instance of instability back into the chair. Timed up and go (TUG): 11.69 sec 6 minute walk test: TBD at next session 10 meter walk test: 10.15 sec/ 0.98 m/s Dynamic Gait Index: TBD at next session SLS on R LE: 9.81 sec; L LE: 17.82 sec  PATIENT SURVEYS:  ABC scale 65.6%                                                                                                                              TREATMENT DATE: 11/08/2023  6 Min Walk Test:  Instructed patient to ambulate as quickly  and as safely as possible for 6 minutes using LRAD. Patient was allowed to take standing rest breaks without stopping the test, but if the patient required a sitting rest break the clock would be stopped and the test would be over.  Results: 1050 feet (320 meters, Avg speed 0.30m/s) using no device with CGA. Results indicate that the patient has reduced endurance with ambulation compared to age matched norms.  Age Matched Norms: 21-69 yo M: 26 F: 50, 53-79 yo M: 57 F: 471, 25-89 yo M: 417 F: 392 MDC: 58.21 meters (190.98 feet) or 50 meters (ANPTA Core Set of Outcome Measures for Adults with Neurologic Conditions, 2018)     [facility] PT Assessment - 11/07/23 1551       Standardized Balance Assessment   Standardized Balance Assessment Dynamic Gait Index      Dynamic Gait Index   Level Surface Normal    Change in Gait Speed Normal    Gait with Horizontal Head Turns Mild Impairment    Gait with Vertical Head Turns Mild Impairment    Gait and Pivot Turn Normal    Step Over Obstacle Mild Impairment    Step Around Obstacles Normal    Steps Mild Impairment    Total Score 20          NMR:  Static stand in corner with the following-  -feet together  hold 30 sec x 2 -feet together with EC x 30 sec x 2 -Feet together with horizontal then vertical head motions x 10 reps -feet together on airex pad x 30 sec x 3 (unsteady at times)    PATIENT EDUCATION: Education details: Pt educated on role of PT and services provided during current POC, along with prognosis and information about the clinic. Person educated: Patient and Spouse Education method: Explanation Education comprehension: verbalized understanding  HOME EXERCISE PROGRAM:  To be established at the next visit and given to pt.  GOALS: Goals reviewed with patient? Yes  SHORT TERM GOALS: Target date: 10/26/2023  Pt will be independent with HEP in order to demonstrate increased ability to perform tasks related to occupation/hobbies. Baseline:  Pt to be given HEP at next visit. Goal status: INITIAL   LONG TERM GOALS: Target date: 12/07/2023  1.  Patient (> 42 years old) will complete five times sit to stand test in <10 seconds indicating an increased LE strength and improved balance. Baseline: 12.69 sec Goal status: INITIAL  2.  Patient will increase ABC score to improve by 8%   to demonstrate statistically significant improvement in mobility and quality of life.  Baseline: 65.6% Goal status: INITIAL   3.  Patient will increase DGI score by > 2 points to demonstrate decreased fall risk during functional activities. Baseline: TBD at next visit; 11/07/2023= 20/24 Goal status: INITIAL   4.  Patient will reduce timed up and go to <11 seconds to reduce fall risk and demonstrate improved transfer/gait ability. Baseline: 11.69 sec Goal status: INITIAL  5.  Patient will increase 10 meter walk test to >1.83m/s as to improve gait speed for better community ambulation and to reduce fall risk. Baseline: 0.98 m/s Goal status: INITIAL  6.  Patient will increase six minute walk test distance to >1200 for progression to community ambulator and improve gait ability Baseline: TBD at next  visit. 11/07/2023= 1050 feet Goal status: REVISED    ASSESSMENT:  CLINICAL IMPRESSION: Patient is a 63 y.o. female who was seen today for physical therapy  treatment for Parkinson's. Continued  with further assessment including 6 min walk which she presents below her age/sex norm values. Patient also presents with mild balance impairments as seen by DGI score. She may benefit from further advanced balance assessment outcome measures in future visits. She was instructed in some static balance activities and will need to add to HEP next 1-2 visits.  Pt will continue to benefit from skilled therapy to address her LE muscle weakness and impaired mobility in order to improve overall QoL and return to PLOF.     OBJECTIVE IMPAIRMENTS: Abnormal gait, decreased activity tolerance, decreased balance, decreased endurance, decreased knowledge of condition, decreased mobility, difficulty walking, decreased strength, impaired perceived functional ability, and impaired UE functional use.   ACTIVITY LIMITATIONS: carrying, lifting, squatting, reach over head, and locomotion level  PARTICIPATION LIMITATIONS: shopping, community activity, occupation, and yard work  PERSONAL FACTORS: Age, Past/current experiences, Time since onset of injury/illness/exacerbation, and 3+ comorbidities: CKD, Anemia, clinical depression, tremor, epilepsy, DM2, Migraines, CHF, HTN are also affecting patient's functional outcome.   REHAB POTENTIAL: Good  CLINICAL DECISION MAKING: Evolving/moderate complexity  EVALUATION COMPLEXITY: Moderate  PLAN:  PT FREQUENCY: 1-2x/week  PT DURATION: 12 weeks  PLANNED INTERVENTIONS: 97750- Physical Performance Testing, 97110-Therapeutic exercises, 97530- Therapeutic activity, 97112- Neuromuscular re-education, 97535- Self Care, [id number]- Manual therapy, [phone]- Gait training, Vestibular training, and DME instructions  PLAN FOR NEXT SESSION:  Review and progress HEP Progress both static and  dynamic balance activities    Chyrl London, PT Physical Therapist - [facility]  [facility]  11/08/23, 1:54 PM

## 2023-11-07 NOTE — Telephone Encounter (Signed)
 Patient advised. Verbalized understanding. Appreciated the update

## 2023-11-09 ENCOUNTER — Ambulatory Visit: Admitting: Physical Therapy

## 2023-11-09 DIAGNOSIS — M6281 Muscle weakness (generalized): Secondary | ICD-10-CM

## 2023-11-09 DIAGNOSIS — R2689 Other abnormalities of gait and mobility: Secondary | ICD-10-CM

## 2023-11-09 DIAGNOSIS — R262 Difficulty in walking, not elsewhere classified: Secondary | ICD-10-CM | POA: Diagnosis not present

## 2023-11-09 DIAGNOSIS — R251 Tremor, unspecified: Secondary | ICD-10-CM

## 2023-11-09 NOTE — Therapy (Signed)
 OUTPATIENT PHYSICAL THERAPY NEURO TREATMENT   Patient Name: Abigail Figueroa MRN: [id number] DOB:Sep 28, 1960, 63 y.o., female Today's Date: 11/09/2023   PCP: [name], MD  REFERRING PROVIDER: [name], MD   END OF SESSION:   PT End of Session - 11/09/23 1537     Visit Number 3    Number of Visits 25    Date for PT Re-Evaluation 12/07/23    Authorization Type VL based on auth    Authorization Time Period 09/14/23-12/07/23    Progress Note Due on Visit 10    PT Start Time 1538    PT Stop Time 1620    PT Time Calculation (min) 42 min    Equipment Utilized During Treatment Gait belt    Activity Tolerance Patient tolerated treatment well    Behavior During Therapy [facility] for tasks assessed/performed           Past Medical History:  Diagnosis Date   Abnormal stress test    a. 10/2009: Normal myocardial perfusion imaging; b. 08/2015 MV: medium defect of mod severity in mid ant apical region w/ mild HK of the distal inf wall;  c. 08/2015 Cath: nl Cors, EF 60%.   Allergy     Not sure   Anemia    Angio-edema    Anxiety    Arthritis    Bell's palsy    CHF (congestive heart failure) (HCC)    Not sure   Chronic kidney disease 2024   Depression    Dermatitis 09/10/2014   Edema    Essential hypertension    Frequent urination    Frequent urination at night    Headache    migraines, gets botox injections   Heart palpitations 10/2011   Event monitor showing sinus tachycardia, PACs with couplets and triplets.   Hx of echocardiogram    a. 10/2009 Echo: showed normal left ventricular function, mild left ventricular hypertrophy, and no significant valve abnormalities.   Hypercholesterolemia    Migraine    Morbid obesity (HCC)    Neuromuscular disorder (HCC)    OSA (obstructive sleep apnea)    has been on continuous positive airway pressure   Seizure disorder (HCC)    Seizures (HCC)    Sleep apnea    Not sure   Type II diabetes mellitus (HCC)    Urticaria     Past Surgical History:  Procedure Laterality Date   ABDOMINAL HYSTERECTOMY  1990   without BSO   BACK SURGERY  1900s 2001   x2 with cage put in   BILATERAL SALPINGOOPHORECTOMY  1990   CARDIAC CATHETERIZATION N/A 08/06/2015   Procedure: Left Heart Cath and Coronary Angiography;  Surgeon: Debby DELENA Sor, MD;  Location: [facility];  Service: Cardiovascular;  Laterality: N/A;   CARPAL TUNNEL RELEASE Bilateral    CHOLECYSTECTOMY  1980   COLONOSCOPY WITH PROPOFOL  N/A 08/30/2017   Procedure: COLONOSCOPY WITH PROPOFOL ;  Surgeon: Unk Corinn Skiff, MD;  Location: [facility];  Service: Endoscopy;  Laterality: N/A;   DG THUMB LEFT HAND  01/24/2018   repair joint in left thumb   DORSAL COMPARTMENT RELEASE Left 10/25/2014   Procedure: LEFT FIRST  DORSAL COMPARTMENT RELEASE AND RADIAL TENOSYNOVECTOMY ;  Surgeon: Elsie Mussel, MD;  Location: [facility];  Service: Orthopedics;  Laterality: Left;   HAND SURGERY     KNEE SURGERY Right 2012   meniscus tear   KNEE SURGERY  2012   LAMINECTOMY  1995   TUBAL LIGATION  1982   WRIST SURGERY Right    Patient Active Problem List   Diagnosis Date Noted   Seasonal allergies 07/25/2023   Gout involving toe of left foot 07/25/2023   Tremor 10/14/2022   Other chest pain 07/29/2022   Anemia in stage 4 chronic kidney disease (HCC) 07/19/2022   CKD (chronic kidney disease) stage 4, GFR 15-29 ml/min (HCC) 06/03/2022   Seasonal and perennial allergic rhinitis 10/11/2019   Angio-edema 08/30/2019   Obesity (BMI 30-39.9) 02/06/2019   Osteoarthrosis of hand 12/28/2017   Right-sided Bell's palsy 02/02/2016   Spondylolisthesis of lumbar region 08/25/2015   Type 2 diabetes mellitus with stage 4 chronic kidney disease, without long-term current use of insulin (HCC)    History of brain disorder 11/05/2014   Depression, major, recurrent, moderate (HCC) 11/05/2014   Generalized anxiety disorder 10/16/2014   Hypertension associated with  diabetes (HCC) 10/14/2014   Epilepsy (HCC) 10/14/2014   Migraine 10/14/2014   Dysthymic disorder 10/14/2014   Dyspepsia 10/14/2014   Hematochezia 10/14/2014   Urge incontinence 10/14/2014   Alopecia 09/10/2014   Blood in feces 09/10/2014   Arthralgia of hip 09/10/2014   Snapping thumb syndrome 04/19/2014   De Quervain's disease (radial styloid tenosynovitis) 03/08/2014   Hyperlipidemia associated with type 2 diabetes mellitus (HCC) 01/15/2013   Heart palpation 01/15/2013   Congestive heart failure (HCC) 11/13/2009   HYPERCALCEMIA 05/08/2007   Clinical depression 10/20/2006   Obstructive apnea 03/22/2006    ONSET DATE: 10/02/2022  REFERRING DIAG: G20.A2 (ICD-10-CM) - Parkinson's disease with fluctuating manifestations, unspecified whether dyskinesia present (HCC)   THERAPY DIAG:  Balance disorder  Difficulty in walking, not elsewhere classified  Muscle weakness (generalized)  Shakiness  Rationale for Evaluation and Treatment: Rehabilitation  SUBJECTIVE:                                                                                                                                                                                             SUBJECTIVE STATEMENT:   From Today: Pt states she is doing OK. Reports she is interested in OT to specifically address the tremors in her hands. Pt reports the balance interventions at home went OK. Denies falls, but reports a few stumbles. States when she gets up first thing in the morning her balance is off. States sometimes when she is walking she will just go off towards one side. Denies pain to start session.  OT referral***  From EVAL: Pt was diagnosed with Parkinson's back in June of 2024.  Pt noted that she was experiencing tremors prior to that, but the formal diagnosis happened in June.  Pt is seeking  therapy in order to be able to control her hands and to improve her balance.     Pt accompanied by: significant other,  husband, Koren  PERTINENT HISTORY:  PMH: Hypertension associated with diabetes, Hyperlipidemia associated with type 2 diabetes mellitus, Type 2 diabetes mellitus with stage 4 chronic kidney disease, without long-term current use of insulin, CKD (chronic kidney disease) stage 4, GFR 15-29 ml/min (HCC), Obesity, anemia,  Parkinson's disease with fluctuating manifestations, unspecified whether dyskinesia present, hx of gout in L great toe and Achille's tendonitis  PAIN:  Are you having pain? Yes, but only where she received the pneumonia shot.    PRECAUTIONS: None  RED FLAGS: None   WEIGHT BEARING RESTRICTIONS: No  FALLS: Has patient fallen in last 6 months? No  LIVING ENVIRONMENT: Lives with: lives with their family Lives in: House/apartment Stairs: Yes: External: 1 on the front , 5 steps on the back steps; can reach both on the back of the house Has following equipment at home: Single point cane and Walker - 2 wheeled  PLOF: Independent  PATIENT GOALS: Pt is hoping to be able to control her hands better and to be able to ambulate without fear of losing balance.  OBJECTIVE:  Note: Objective measures were completed at Evaluation unless otherwise noted.  DIAGNOSTIC FINDINGS: N/A  COGNITION: Overall cognitive status: Within functional limits for tasks assessed   SENSATION: WFL  COORDINATION: WFL   POSTURE: rounded shoulders, forward head, and anterior pelvic tilt  LOWER EXTREMITY MMT:    MMT Right Eval Left Eval  Hip flexion 4+ 4+  Hip extension    Hip abduction 4+ 4+  Hip adduction 4+ 4+  Hip internal rotation    Hip external rotation    Knee flexion 4+ 4+  Knee extension 4+ 4+  Ankle dorsiflexion 5 5  Ankle plantarflexion    Ankle inversion    Ankle eversion    (Blank rows = not tested)  BED MOBILITY:  Not tested  TRANSFERS: Sit to stand: Complete Independence  Assistive device utilized: None     Stand to sit: Complete Independence  Assistive device  utilized: None     Chair to chair: Complete Independence  Assistive device utilized: None       RAMP:  Not tested  CURB:  Not tested  STAIRS: Findings: Level of Assistance: Complete Independence, Stair Negotiation Technique: Alternating Pattern  Forwards with Bilateral Rails, Number of Stairs: 8, Height of Stairs: 6   , and Comments: Pt performed well with using singular UE to support self at times, and bilateral on the final step descending.  GAIT: Findings: Pt with good gait mechanics during short distance walking, just slowed performance.  FUNCTIONAL TESTS:  5 times sit to stand: 12.69 sec with one instance of instability back into the chair. Timed up and go (TUG): 11.69 sec 6 minute walk test: TBD at next session 10 meter walk test: 10.15 sec/ 0.98 m/s Dynamic Gait Index: TBD at next session SLS on R LE: 9.81 sec; L LE: 17.82 sec  PATIENT SURVEYS:  ABC scale 65.6%  TREATMENT DATE: 11/09/2023  Unless otherwise stated, CGA was provided and gait belt donned in order to ensure pt safety throughout session.  Dynamic gait training a total of ~378ft  with CGA/light min assist for steadying/balance during the below challenges: Forward gait with R/L visual scanning to identify targets on wall (post-it notes)  Pt rates this as easy Fast forward gait with focus on large step lengths and arm swings Backwards gait progressed to sudden start/stops Then progressed to visually scanning R or L on therapist's command to identify target on wall Pt with some difficulty controlling backwards momentum when suddenly stopping, having minor L posterior LOB bias  Dynamic balance interventions as follows:  Forward/backwards stepping over 1/2 foam roll X8 reps per LE  More difficulty stepping forward leading with L LE and backwards with R LE Progressed to forward/backwards  stepping over 1/2 foam roll with dual-task challenge of foot tapping to hedgehogs in 4 corners on therapist cuing   During the balance interventions, pt's L knee starts to suddenly give way with minor knee flexion and pt having difficulty powering back up into standing due to feeling weak through the L LE; therefore, provided seated rest break. Pt denies pain when this occurs. Pt reports she has previously had meniscal surgery on R knee, but no hx of surgery or injury to L knee.   Following seated rest break attempted standing NBOS; however, pt again reports feeling weakness in L LE therefore returned to sitting and assessed vitals: BP 139/81 (MAP 100), HR 68bpm  Pt later states NBOS with her eyes closed makes her nervous, but she is agreeable to continue attempting it at home with her DTR's support.   Seated L LE LAQ x10 reps with 3sec isometric hold  Demos full ROM with good quad activation  Gait training ~59ft with therapist providing L HHA for pt safety/security to assess whether pt continuing to have L knee instability, but no knee buckling occurred and pt agreeable to continue with session.   Performed 1 set of the following exercises to ensure proper form/technique in preparation for HEP prescription. Therapist providing visual demonstration of exercises and updates pt's written HEP with modifications/progressions of exercises as needed.   - Staggered Stance Weight Shift with Arms Reaching  x10 reps - Standing Reach to Opposite Side with Weight Shift  x10 reps - Sit to Stand with Arm Swing x10 reps   Provided HEP printout as described below.     PATIENT EDUCATION: Education details: Pt educated on role of PT and services provided during current POC, along with prognosis and information about the clinic. Person educated: Patient and Spouse Education method: Explanation Education comprehension: verbalized understanding  HOME EXERCISE PROGRAM:  Access Code: [email or web address] URL:  [email or web address] Date: 11/09/2023 Prepared by: Connell Kiss  Exercises - Backward Walking with Counter Support  - 1 x daily - 3 x weekly - 2 sets - 10 reps - Staggered Stance Weight Shift with Arms Reaching  - 1 x daily - 3 x weekly - 2 sets - 10 reps - Standing Reach to Opposite Side with Weight Shift  - 1 x daily - 3 x weekly - 2 sets - 10 reps - Narrow Stance with Counter Support - EYES CLOSED  - 1 x daily - 3 x weekly - 2 sets - 20-30 seconds  hold - Sit to Stand with Arm Swing  - 1 x daily - 3 x weekly - 2 sets - 10 reps     GOALS: Goals  reviewed with patient? Yes  SHORT TERM GOALS: Target date: 10/26/2023  Pt will be independent with HEP in order to demonstrate increased ability to perform tasks related to occupation/hobbies. Baseline:  Pt to be given HEP at next visit. Goal status: INITIAL   LONG TERM GOALS: Target date: 12/07/2023  1.  Patient (> 8 years old) will complete five times sit to stand test in <10 seconds indicating an increased LE strength and improved balance. Baseline: 12.69 sec Goal status: INITIAL  2.  Patient will increase ABC score to improve by 8%   to demonstrate statistically significant improvement in mobility and quality of life.  Baseline: 65.6% Goal status: INITIAL   3.  Patient will increase DGI score by > 2 points to demonstrate decreased fall risk during functional activities. Baseline: TBD at next visit; 11/07/2023= 20/24 Goal status: INITIAL   4.  Patient will reduce timed up and go to <11 seconds to reduce fall risk and demonstrate improved transfer/gait ability. Baseline: 11.69 sec Goal status: INITIAL  5.  Patient will increase 10 meter walk test to >1.94m/s as to improve gait speed for better community ambulation and to reduce fall risk. Baseline: 0.98 m/s Goal status: INITIAL  6.  Patient will increase six minute walk test distance to >1200 for progression to community ambulator and improve gait ability Baseline:  TBD at next visit. 11/07/2023= 1050 feet Goal status: REVISED    ASSESSMENT:  CLINICAL IMPRESSION:  Patient is a 63 y.o. female who was seen today for physical therapy  treatment for Parkinson's. Patient arrives with excellent motivation to participate in therapy session. Patient participates in dynamic gait training including dual-task visual scanning, fast forward gait, and backwards gait with dual-task as well as sudden start/stops. Pt noticed to have some difficulty controlling backwards momentum with sudden stops, requiring min A for balance. Pt also participated in higher level dynamic stepping balance interventions, with pt having L LE weakness/muscle fatigue with slight knee giving way, requiring seated rest break. Therapist led patient through 1 set of each of her initial HEP exercises and provided printout. Pt will continue to benefit from skilled therapy to address her LE muscle weakness and impaired mobility in order to improve overall QoL and return to PLOF.     OBJECTIVE IMPAIRMENTS: Abnormal gait, decreased activity tolerance, decreased balance, decreased endurance, decreased knowledge of condition, decreased mobility, difficulty walking, decreased strength, impaired perceived functional ability, and impaired UE functional use.   ACTIVITY LIMITATIONS: carrying, lifting, squatting, reach over head, and locomotion level  PARTICIPATION LIMITATIONS: shopping, community activity, occupation, and yard work  PERSONAL FACTORS: Age, Past/current experiences, Time since onset of injury/illness/exacerbation, and 3+ comorbidities: CKD, Anemia, clinical depression, tremor, epilepsy, DM2, Migraines, CHF, HTN are also affecting patient's functional outcome.   REHAB POTENTIAL: Good  CLINICAL DECISION MAKING: Evolving/moderate complexity  EVALUATION COMPLEXITY: Moderate  PLAN:  PT FREQUENCY: 1-2x/week  PT DURATION: 12 weeks  PLANNED INTERVENTIONS: 97750- Physical Performance Testing,  97110-Therapeutic exercises, 97530- Therapeutic activity, 97112- Neuromuscular re-education, 97535- Self Care, [id number]- Manual therapy, [id number]- Gait training, Vestibular training, and DME instructions  PLAN FOR NEXT SESSION:  - Review new HEP and then progress HEP when appropriate - Progress static balance with eyes closed - Progress dynamic balance activities  - monitor L knee giving way - Dynamic gait training  - sudden start/stops during backwards gait - consider Mini-BEST Test at Progress Note    Trudi Morgenthaler, PT, DPT, NCS, CSRS Physical Therapist - [place]  [facility]  4:22 PM 11/09/23

## 2023-11-10 DIAGNOSIS — G43719 Chronic migraine without aura, intractable, without status migrainosus: Secondary | ICD-10-CM | POA: Diagnosis not present

## 2023-11-10 DIAGNOSIS — M791 Myalgia, unspecified site: Secondary | ICD-10-CM | POA: Diagnosis not present

## 2023-11-10 DIAGNOSIS — M542 Cervicalgia: Secondary | ICD-10-CM | POA: Diagnosis not present

## 2023-11-14 ENCOUNTER — Other Ambulatory Visit (HOSPITAL_COMMUNITY): Payer: Self-pay

## 2023-11-14 ENCOUNTER — Ambulatory Visit: Admitting: Physical Therapy

## 2023-11-14 DIAGNOSIS — R262 Difficulty in walking, not elsewhere classified: Secondary | ICD-10-CM

## 2023-11-14 DIAGNOSIS — M6281 Muscle weakness (generalized): Secondary | ICD-10-CM | POA: Diagnosis not present

## 2023-11-14 DIAGNOSIS — R2689 Other abnormalities of gait and mobility: Secondary | ICD-10-CM | POA: Diagnosis not present

## 2023-11-14 DIAGNOSIS — R251 Tremor, unspecified: Secondary | ICD-10-CM

## 2023-11-14 NOTE — Therapy (Signed)
 OUTPATIENT PHYSICAL THERAPY NEURO TREATMENT   Patient Name: Abigail Figueroa MRN: [id number] DOB:12-11-1960, 63 y.o., female Today's Date: 11/14/2023   PCP: [name], MD  REFERRING PROVIDER: [name], MD   END OF SESSION:   PT End of Session - 11/14/23 1156     Visit Number 4    Number of Visits 25    Date for PT Re-Evaluation 12/07/23    Authorization Type VL based on auth    Authorization Time Period 09/14/23-12/07/23    Progress Note Due on Visit 10    PT Start Time 1159    PT Stop Time 1230    PT Time Calculation (min) 31 min    Equipment Utilized During Treatment Gait belt    Activity Tolerance Patient tolerated treatment well    Behavior During Therapy WFL for tasks assessed/performed           Past Medical History:  Diagnosis Date   Abnormal stress test    a. 10/2009: Normal myocardial perfusion imaging; b. 08/2015 MV: medium defect of mod severity in mid ant apical region w/ mild HK of the distal inf wall;  c. 08/2015 Cath: nl Cors, EF 60%.   Allergy     Not sure   Anemia    Angio-edema    Anxiety    Arthritis    Bell's palsy    CHF (congestive heart failure) (HCC)    Not sure   Chronic kidney disease 2024   Depression    Dermatitis 09/10/2014   Edema    Essential hypertension    Frequent urination    Frequent urination at night    Headache    migraines, gets botox injections   Heart palpitations 10/2011   Event monitor showing sinus tachycardia, PACs with couplets and triplets.   Hx of echocardiogram    a. 10/2009 Echo: showed normal left ventricular function, mild left ventricular hypertrophy, and no significant valve abnormalities.   Hypercholesterolemia    Migraine    Morbid obesity (HCC)    Neuromuscular disorder (HCC)    OSA (obstructive sleep apnea)    has been on continuous positive airway pressure   Seizure disorder (HCC)    Seizures (HCC)    Sleep apnea    Not sure   Type II diabetes mellitus (HCC)    Urticaria     Past Surgical History:  Procedure Laterality Date   ABDOMINAL HYSTERECTOMY  1990   without BSO   BACK SURGERY  1900s 2001   x2 with cage put in   BILATERAL SALPINGOOPHORECTOMY  1990   CARDIAC CATHETERIZATION N/A 08/06/2015   Procedure: Left Heart Cath and Coronary Angiography;  Surgeon: Debby DELENA Sor, MD;  Location: [facility];  Service: Cardiovascular;  Laterality: N/A;   CARPAL TUNNEL RELEASE Bilateral    CHOLECYSTECTOMY  1980   COLONOSCOPY WITH PROPOFOL  N/A 08/30/2017   Procedure: COLONOSCOPY WITH PROPOFOL ;  Surgeon: Unk Corinn Skiff, MD;  Location: [facility];  Service: Endoscopy;  Laterality: N/A;   DG THUMB LEFT HAND  01/24/2018   repair joint in left thumb   DORSAL COMPARTMENT RELEASE Left 10/25/2014   Procedure: LEFT FIRST  DORSAL COMPARTMENT RELEASE AND RADIAL TENOSYNOVECTOMY ;  Surgeon: Elsie Mussel, MD;  Location: [facility];  Service: Orthopedics;  Laterality: Left;   HAND SURGERY     KNEE SURGERY Right 2012   meniscus tear   KNEE SURGERY  2012   LAMINECTOMY  1995   TUBAL LIGATION  1982   WRIST SURGERY Right    Patient Active Problem List   Diagnosis Date Noted   Seasonal allergies 07/25/2023   Gout involving toe of left foot 07/25/2023   Tremor 10/14/2022   Other chest pain 07/29/2022   Anemia in stage 4 chronic kidney disease (HCC) 07/19/2022   CKD (chronic kidney disease) stage 4, GFR 15-29 ml/min (HCC) 06/03/2022   Seasonal and perennial allergic rhinitis 10/11/2019   Angio-edema 08/30/2019   Obesity (BMI 30-39.9) 02/06/2019   Osteoarthrosis of hand 12/28/2017   Right-sided Bell's palsy 02/02/2016   Spondylolisthesis of lumbar region 08/25/2015   Type 2 diabetes mellitus with stage 4 chronic kidney disease, without long-term current use of insulin (HCC)    History of brain disorder 11/05/2014   Depression, major, recurrent, moderate (HCC) 11/05/2014   Generalized anxiety disorder 10/16/2014   Hypertension associated with  diabetes (HCC) 10/14/2014   Epilepsy (HCC) 10/14/2014   Migraine 10/14/2014   Dysthymic disorder 10/14/2014   Dyspepsia 10/14/2014   Hematochezia 10/14/2014   Urge incontinence 10/14/2014   Alopecia 09/10/2014   Blood in feces 09/10/2014   Arthralgia of hip 09/10/2014   Snapping thumb syndrome 04/19/2014   De Quervain's disease (radial styloid tenosynovitis) 03/08/2014   Hyperlipidemia associated with type 2 diabetes mellitus (HCC) 01/15/2013   Heart palpation 01/15/2013   Congestive heart failure (HCC) 11/13/2009   HYPERCALCEMIA 05/08/2007   Clinical depression 10/20/2006   Obstructive apnea 03/22/2006    ONSET DATE: 10/02/2022  REFERRING DIAG: G20.A2 (ICD-10-CM) - Parkinson's disease with fluctuating manifestations, unspecified whether dyskinesia present (HCC)   THERAPY DIAG:  Balance disorder  Difficulty in walking, not elsewhere classified  Muscle weakness (generalized)  Shakiness  Rationale for Evaluation and Treatment: Rehabilitation  SUBJECTIVE:                                                                                                                                                                                             SUBJECTIVE STATEMENT:   From Today:  Arrives late to PT, states that parking lot was hectic. Pt reports that she is doing okay this morning. No falls. A few stumbles, but able to self correct. No pain reported on this day.     From EVAL: Pt was diagnosed with Parkinson's back in June of 2024.  Pt noted that she was experiencing tremors prior to that, but the formal diagnosis happened in June.  Pt is seeking therapy in order to be able to control her hands and to improve her balance.     Pt accompanied by: significant other, husband, Koren  PERTINENT HISTORY:  PMH: Hypertension associated  with diabetes, Hyperlipidemia associated with type 2 diabetes mellitus, Type 2 diabetes mellitus with stage 4 chronic kidney disease, without  long-term current use of insulin, CKD (chronic kidney disease) stage 4, GFR 15-29 ml/min (HCC), Obesity, anemia,  Parkinson's disease with fluctuating manifestations, unspecified whether dyskinesia present, hx of gout in L great toe and Achille's tendonitis  PAIN:  Are you having pain? Yes, but only where she received the pneumonia shot.    PRECAUTIONS: None  RED FLAGS: None   WEIGHT BEARING RESTRICTIONS: No  FALLS: Has patient fallen in last 6 months? No  LIVING ENVIRONMENT: Lives with: lives with their family Lives in: House/apartment Stairs: Yes: External: 1 on the front , 5 steps on the back steps; can reach both on the back of the house Has following equipment at home: Single point cane and Walker - 2 wheeled  PLOF: Independent  PATIENT GOALS: Pt is hoping to be able to control her hands better and to be able to ambulate without fear of losing balance.  OBJECTIVE:  Note: Objective measures were completed at Evaluation unless otherwise noted.  DIAGNOSTIC FINDINGS: N/A  COGNITION: Overall cognitive status: Within functional limits for tasks assessed   SENSATION: WFL  COORDINATION: WFL   POSTURE: rounded shoulders, forward head, and anterior pelvic tilt  LOWER EXTREMITY MMT:    MMT Right Eval Left Eval  Hip flexion 4+ 4+  Hip extension    Hip abduction 4+ 4+  Hip adduction 4+ 4+  Hip internal rotation    Hip external rotation    Knee flexion 4+ 4+  Knee extension 4+ 4+  Ankle dorsiflexion 5 5  Ankle plantarflexion    Ankle inversion    Ankle eversion    (Blank rows = not tested)  BED MOBILITY:  Not tested  TRANSFERS: Sit to stand: Complete Independence  Assistive device utilized: None     Stand to sit: Complete Independence  Assistive device utilized: None     Chair to chair: Complete Independence  Assistive device utilized: None       RAMP:  Not tested  CURB:  Not tested  STAIRS: Findings: Level of Assistance: Complete Independence, Stair  Negotiation Technique: Alternating Pattern  Forwards with Bilateral Rails, Number of Stairs: 8, Height of Stairs: 6   , and Comments: Pt performed well with using singular UE to support self at times, and bilateral on the final step descending.  GAIT: Findings: Pt with good gait mechanics during short distance walking, just slowed performance.  FUNCTIONAL TESTS:  5 times sit to stand: 12.69 sec with one instance of instability back into the chair. Timed up and go (TUG): 11.69 sec 6 minute walk test: TBD at next session 10 meter walk test: 10.15 sec/ 0.98 m/s Dynamic Gait Index: TBD at next session SLS on R LE: 9.81 sec; L LE: 17.82 sec  PATIENT SURVEYS:  ABC scale 65.6%  TREATMENT DATE: 11/14/2023  Unless otherwise stated, CGA was provided and gait belt donned in order to ensure pt safety throughout session.  TA for improved safety and mechanics for community mobility.  Dynamic gait training  without resistance x 114ft and with 3# AW x 437ft. Cues for heel contact and reciprocal arm swing intermittently. Forward/reverse gait 180ft with 3# AW, interval changes in direction. 1 mild LOB with reverse in turn, but able to correct with stepping then hip strategy.   NMR at address PD deficits. .  Forward/vertical/lateral reaches x 10 Lateral lunge with UE abduction x 10  Seated lateral trunk flexion with BUE at 90 deg abduction.   Single UE lateral and cross body with supination x 10 bil  Standing weight shift with Ue swing x 10 bil.  Sit<>stand with UE swing into extension x 10  Step up with  contralateral LE march x 8 bil UE supported on rail.   Throughout session, PT provided CGA for safety with min cues for  full ROM and improved activation of intrinsic hand mm for PD speciif interventions.   PATIENT EDUCATION: Education details: Pt educated on role of PT and  services provided during current POC, along with prognosis and information about the clinic. HEP review/movement correction Person educated: Patient and Spouse Education method: Explanation Education comprehension: verbalized understanding  HOME EXERCISE PROGRAM:  Access Code: [email or web address] URL: [email or web address] Date: 11/09/2023 Prepared by: Connell Kiss  Exercises - Backward Walking with Counter Support  - 1 x daily - 3 x weekly - 2 sets - 10 reps - Staggered Stance Weight Shift with Arms Reaching  - 1 x daily - 3 x weekly - 2 sets - 10 reps - Standing Reach to Opposite Side with Weight Shift  - 1 x daily - 3 x weekly - 2 sets - 10 reps - Narrow Stance with Counter Support - EYES CLOSED  - 1 x daily - 3 x weekly - 2 sets - 20-30 seconds  hold - Sit to Stand with Arm Swing  - 1 x daily - 3 x weekly - 2 sets - 10 reps     GOALS: Goals reviewed with patient? Yes  SHORT TERM GOALS: Target date: 10/26/2023  Pt will be independent with HEP in order to demonstrate increased ability to perform tasks related to occupation/hobbies. Baseline:  Pt to be given HEP at next visit. Goal status: INITIAL   LONG TERM GOALS: Target date: 12/07/2023  1.  Patient (> 86 years old) will complete five times sit to stand test in <10 seconds indicating an increased LE strength and improved balance. Baseline: 12.69 sec Goal status: INITIAL  2.  Patient will increase ABC score to improve by 8%   to demonstrate statistically significant improvement in mobility and quality of life.  Baseline: 65.6% Goal status: INITIAL   3.  Patient will increase DGI score by > 2 points to demonstrate decreased fall risk during functional activities. Baseline: TBD at next visit; 11/07/2023= 20/24 Goal status: INITIAL   4.  Patient will reduce timed up and go to <11 seconds to reduce fall risk and demonstrate improved transfer/gait ability. Baseline: 11.69 sec Goal status: INITIAL  5.  Patient will  increase 10 meter walk test to >1.90m/s as to improve gait speed for better community ambulation and to reduce fall risk. Baseline: 0.98 m/s Goal status: INITIAL  6.  Patient will increase six minute walk test distance to >1200 for progression to community ambulator and improve gait ability Baseline:  TBD at next visit. 11/07/2023= 1050 feet Goal status: REVISED    ASSESSMENT:  CLINICAL IMPRESSION:  Patient is a 63 y.o. female who was seen today for physical therapy  treatment for Parkinson's. PT treatment limited due to lat arrival. Tolerates all interventions well with CGA provided by PT for safety. HEP partially reviewed with correction of technqiue for PD specific interventions. Pt reports wanted to address tremor with PD management as appropriate. Able to correct posterior LOB with stepping strategy.  Pt will continue to benefit from skilled therapy to address her LE muscle weakness and impaired mobility in order to improve overall QoL and return to PLOF.     OBJECTIVE IMPAIRMENTS: Abnormal gait, decreased activity tolerance, decreased balance, decreased endurance, decreased knowledge of condition, decreased mobility, difficulty walking, decreased strength, impaired perceived functional ability, and impaired UE functional use.   ACTIVITY LIMITATIONS: carrying, lifting, squatting, reach over head, and locomotion level  PARTICIPATION LIMITATIONS: shopping, community activity, occupation, and yard work  PERSONAL FACTORS: Age, Past/current experiences, Time since onset of injury/illness/exacerbation, and 3+ comorbidities: CKD, Anemia, clinical depression, tremor, epilepsy, DM2, Migraines, CHF, HTN are also affecting patient's functional outcome.   REHAB POTENTIAL: Good  CLINICAL DECISION MAKING: Evolving/moderate complexity  EVALUATION COMPLEXITY: Moderate  PLAN:  PT FREQUENCY: 1-2x/week  PT DURATION: 12 weeks  PLANNED INTERVENTIONS: 97750- Physical Performance Testing,  97110-Therapeutic exercises, 97530- Therapeutic activity, 97112- Neuromuscular re-education, 97535- Self Care, [id number]- Manual therapy, [phone]- Gait training, Vestibular training, and DME instructions  PLAN FOR NEXT SESSION:  *Follow-up on OT referral to address hand tremors*  - Progress static balance with eyes closed - Progress dynamic balance activities  - Dynamic gait training  - sudden start/stops during backwards gait - consider Mini-BEST Test at Progress Note   Massie Dollar PT, DPT  Physical Therapist - [facility]  [facility]  12:41 PM 11/14/23

## 2023-11-16 ENCOUNTER — Other Ambulatory Visit (HOSPITAL_COMMUNITY): Payer: Self-pay

## 2023-11-16 ENCOUNTER — Ambulatory Visit: Admitting: Physical Therapy

## 2023-11-16 NOTE — Telephone Encounter (Signed)
 Spoke with insurance to get an update on the appeal. There is no determination as of yet and no ETA. The person I spoke to said they have up to 60 days to make a decision.

## 2023-11-21 ENCOUNTER — Other Ambulatory Visit (HOSPITAL_COMMUNITY): Payer: Self-pay

## 2023-11-21 ENCOUNTER — Ambulatory Visit

## 2023-11-21 DIAGNOSIS — R262 Difficulty in walking, not elsewhere classified: Secondary | ICD-10-CM | POA: Diagnosis not present

## 2023-11-21 DIAGNOSIS — M6281 Muscle weakness (generalized): Secondary | ICD-10-CM

## 2023-11-21 DIAGNOSIS — R251 Tremor, unspecified: Secondary | ICD-10-CM | POA: Diagnosis not present

## 2023-11-21 DIAGNOSIS — R2689 Other abnormalities of gait and mobility: Secondary | ICD-10-CM | POA: Diagnosis not present

## 2023-11-21 NOTE — Therapy (Signed)
 OUTPATIENT PHYSICAL THERAPY TREATMENT   Patient Name: Abigail Figueroa MRN: [id number] DOB:[birth year], 63 y.o., female Today's Date: 11/21/2023   PCP: [name], MD  REFERRING PROVIDER: [name], MD   END OF SESSION:   PT End of Session - 11/21/23 1532     Visit Number 5    Number of Visits 25    Date for PT Re-Evaluation 12/07/23    Authorization Type VL based on auth    Authorization Time Period 09/14/23-12/07/23    Progress Note Due on Visit 10    PT Start Time 1530    PT Stop Time 1610    PT Time Calculation (min) 40 min    Equipment Utilized During Treatment Gait belt    Activity Tolerance Patient tolerated treatment well    Behavior During Therapy [facility] for tasks assessed/performed           Past Medical History:  Diagnosis Date   Abnormal stress test    a. 10/2009: Normal myocardial perfusion imaging; b. 08/2015 MV: medium defect of mod severity in mid ant apical region w/ mild HK of the distal inf wall;  c. 08/2015 Cath: nl Cors, EF 60%.   Allergy     Not sure   Anemia    Angio-edema    Anxiety    Arthritis    Bell's palsy    CHF (congestive heart failure) (HCC)    Not sure   Chronic kidney disease 2024   Depression    Dermatitis 09/10/2014   Edema    Essential hypertension    Frequent urination    Frequent urination at night    Headache    migraines, gets botox injections   Heart palpitations 10/2011   Event monitor showing sinus tachycardia, PACs with couplets and triplets.   Hx of echocardiogram    a. 10/2009 Echo: showed normal left ventricular function, mild left ventricular hypertrophy, and no significant valve abnormalities.   Hypercholesterolemia    Migraine    Morbid obesity (HCC)    Neuromuscular disorder (HCC)    OSA (obstructive sleep apnea)    has been on continuous positive airway pressure   Seizure disorder (HCC)    Seizures (HCC)    Sleep apnea    Not sure   Type II diabetes mellitus (HCC)    Urticaria     Past Surgical History:  Procedure Laterality Date   ABDOMINAL HYSTERECTOMY  1990   without BSO   BACK SURGERY  1900s 2001   x2 with cage put in   BILATERAL SALPINGOOPHORECTOMY  1990   CARDIAC CATHETERIZATION N/A 08/06/2015   Procedure: Left Heart Cath and Coronary Angiography;  Surgeon: Debby DELENA Sor, MD;  Location: [facility];  Service: Cardiovascular;  Laterality: N/A;   CARPAL TUNNEL RELEASE Bilateral    CHOLECYSTECTOMY  1980   COLONOSCOPY WITH PROPOFOL  N/A 08/30/2017   Procedure: COLONOSCOPY WITH PROPOFOL ;  Surgeon: Unk Corinn Skiff, MD;  Location: [facility] ENDOS[facility];  Service: Endoscopy;  Laterality: N/A;   DG THUMB LEFT HAND  01/24/2018   repair joint in left thumb   DORSAL COMPARTMENT RELEASE Left 10/25/2014   Procedure: LEFT FIRST  DORSAL COMPARTMENT RELEASE AND RADIAL TENOSYNOVECTOMY ;  Surgeon: Elsie Mussel, MD;  Location: [facility];  Service: Orthopedics;  Laterality: Left;   HAND SURGERY     KNEE SURGERY Right 2012   meniscus tear   KNEE SURGERY  2012   LAMINECTOMY  1995   TUBAL LIGATION  1982  WRIST SURGERY Right    Patient Active Problem List   Diagnosis Date Noted   Seasonal allergies 07/25/2023   Gout involving toe of left foot 07/25/2023   Tremor 10/14/2022   Other chest pain 07/29/2022   Anemia in stage 4 chronic kidney disease (HCC) 07/19/2022   CKD (chronic kidney disease) stage 4, GFR 15-29 ml/min (HCC) 06/03/2022   Seasonal and perennial allergic rhinitis 10/11/2019   Angio-edema 08/30/2019   Obesity (BMI 30-39.9) 02/06/2019   Osteoarthrosis of hand 12/28/2017   Right-sided Bell's palsy 02/02/2016   Spondylolisthesis of lumbar region 08/25/2015   Type 2 diabetes mellitus with stage 4 chronic kidney disease, without long-term current use of insulin (HCC)    History of brain disorder 11/05/2014   Depression, major, recurrent, moderate (HCC) 11/05/2014   Generalized anxiety disorder 10/16/2014   Hypertension associated with  diabetes (HCC) 10/14/2014   Epilepsy (HCC) 10/14/2014   Migraine 10/14/2014   Dysthymic disorder 10/14/2014   Dyspepsia 10/14/2014   Hematochezia 10/14/2014   Urge incontinence 10/14/2014   Alopecia 09/10/2014   Blood in feces 09/10/2014   Arthralgia of hip 09/10/2014   Snapping thumb syndrome 04/19/2014   De Quervain's disease (radial styloid tenosynovitis) 03/08/2014   Hyperlipidemia associated with type 2 diabetes mellitus (HCC) 01/15/2013   Heart palpation 01/15/2013   Congestive heart failure (HCC) 11/13/2009   HYPERCALCEMIA 05/08/2007   Clinical depression 10/20/2006   Obstructive apnea 03/22/2006    ONSET DATE: 10/02/2022  REFERRING DIAG: G20.A2 (ICD-10-CM) - Parkinson's disease with fluctuating manifestations, unspecified whether dyskinesia present (HCC)   THERAPY DIAG:  Balance disorder  Difficulty in walking, not elsewhere classified  Muscle weakness (generalized)  Rationale for Evaluation and Treatment: Rehabilitation  SUBJECTIVE:                                                                                                                                                                                             SUBJECTIVE STATEMENT:   From Today:  Arrives late to PT, states that parking lot was hectic. Pt reports that she is doing okay this morning. No falls. A few stumbles, but able to self correct. No pain reported on this day.     From EVAL: Pt was diagnosed with Parkinson's back in June of 2024.  Pt noted that she was experiencing tremors prior to that, but the formal diagnosis happened in June.  Pt is seeking therapy in order to be able to control her hands and to improve her balance.     Pt accompanied by: significant other, husband, Koren  PERTINENT HISTORY:  PMH: Hypertension associated with diabetes, Hyperlipidemia associated with  type 2 diabetes mellitus, Type 2 diabetes mellitus with stage 4 chronic kidney disease, without long-term current  use of insulin, CKD (chronic kidney disease) stage 4, GFR 15-29 ml/min (HCC), Obesity, anemia,  Parkinson's disease with fluctuating manifestations, unspecified whether dyskinesia present, hx of gout in L great toe and Achille's tendonitis  PAIN:  Are you having pain? Yes, but only where she received the pneumonia shot.    PRECAUTIONS: None  RED FLAGS: None   WEIGHT BEARING RESTRICTIONS: No  FALLS: Has patient fallen in last 6 months? No  LIVING ENVIRONMENT: Lives with: lives with their family Lives in: House/apartment Stairs: Yes: External: 1 on the front , 5 steps on the back steps; can reach both on the back of the house Has following equipment at home: Single point cane and Walker - 2 wheeled  PLOF: Independent  PATIENT GOALS: Pt is hoping to be able to control her hands better and to be able to ambulate without fear of losing balance.  OBJECTIVE:  Note: Objective measures were completed at Evaluation unless otherwise noted.  DIAGNOSTIC FINDINGS: N/A  COGNITION: Overall cognitive status: Within functional limits for tasks assessed   SENSATION: WFL  COORDINATION: WFL   POSTURE: rounded shoulders, forward head, and anterior pelvic tilt  LOWER EXTREMITY MMT:    MMT Right Eval Left Eval  Hip flexion 4+ 4+  Hip extension    Hip abduction 4+ 4+  Hip adduction 4+ 4+  Hip internal rotation    Hip external rotation    Knee flexion 4+ 4+  Knee extension 4+ 4+  Ankle dorsiflexion 5 5  Ankle plantarflexion    Ankle inversion    Ankle eversion    (Blank rows = not tested)  BED MOBILITY:  Not tested  TRANSFERS: Sit to stand: Complete Independence  Assistive device utilized: None     Stand to sit: Complete Independence  Assistive device utilized: None     Chair to chair: Complete Independence  Assistive device utilized: None       RAMP:  Not tested  CURB:  Not tested  STAIRS: Findings: Level of Assistance: Complete Independence, Stair Negotiation  Technique: Alternating Pattern  Forwards with Bilateral Rails, Number of Stairs: 8, Height of Stairs: 6   , and Comments: Pt performed well with using singular UE to support self at times, and bilateral on the final step descending.  GAIT: Findings: Pt with good gait mechanics during short distance walking, just slowed performance.  FUNCTIONAL TESTS:  5 times sit to stand: 12.69 sec with one instance of instability back into the chair. Timed up and go (TUG): 11.69 sec 6 minute walk test: TBD at next session 10 meter walk test: 10.15 sec/ 0.98 m/s Dynamic Gait Index: TBD at next session SLS on R LE: 9.81 sec; L LE: 17.82 sec  PATIENT SURVEYS:  ABC scale 65.6%  TREATMENT DATE: 11/21/2023 -957ft AMB overground, no device, no pain, no LOB: 4 minutes 51 sec  -STS from chair hugging the orange 5 kilograms ball x10  -standing bluephyzball overhead press x15 (cues for max height!)  -STS from chair hugging the orange 5 kilograms ball x10  -standing bluephyzball overhead press x15 (leaning posterio pelvis on // bar)   -c 2lb AW AMB in hallway backwards x66ft with green ball self/toss catch  -c 2lb AW AMB in hallway backwards x34ft with green ball toss/catch to [employer]  -c 2lb AW overhead rebounding in hall wth alterante 180 degree turns x20  -c 2lb AW AMB in hallway backwards x44ft with green ball toss/catch to Koren   -c 2lb AW bilat, AMB obstacle course in // bars: step over QC, airex pad, red mat, and 4 step *forward backward twice *right and back twice *left and back twice  (Cues for bigger amplitude swing)   -in wellzone: baseball batting practice with nerf balls and tennis racquet: husband pitches 2x5: pt runs after the balls and picks them up (supervision level, no LOB)   PATIENT EDUCATION: Education details: Pt educated on role of PT and services provided  during current POC, along with prognosis and information about the clinic. HEP review/movement correction Person educated: Patient and Spouse Education method: Explanation Education comprehension: verbalized understanding  HOME EXERCISE PROGRAM:  Access Code: [email or web address] URL: [email or web address] Date: 11/09/2023 Prepared by: Connell Kiss  Exercises - Backward Walking with Counter Support  - 1 x daily - 3 x weekly - 2 sets - 10 reps - Staggered Stance Weight Shift with Arms Reaching  - 1 x daily - 3 x weekly - 2 sets - 10 reps - Standing Reach to Opposite Side with Weight Shift  - 1 x daily - 3 x weekly - 2 sets - 10 reps - Narrow Stance with Counter Support - EYES CLOSED  - 1 x daily - 3 x weekly - 2 sets - 20-30 seconds  hold - Sit to Stand with Arm Swing  - 1 x daily - 3 x weekly - 2 sets - 10 reps  GOALS: Goals reviewed with patient? Yes  SHORT TERM GOALS: Target date: 10/26/2023 Pt will be independent with HEP in order to demonstrate increased ability to perform tasks related to occupation/hobbies. Baseline:  Pt to be given HEP at next visit. Goal status: INITIAL  LONG TERM GOALS: Target date: 12/07/2023 1.  Patient (> 54 years old) will complete five times sit to stand test in <10 seconds indicating an increased LE strength and improved balance. Baseline: 12.69 sec Goal status: INITIAL  2.  Patient will increase ABC score to improve by 8%   to demonstrate statistically significant improvement in mobility and quality of life.  Baseline: 65.6% Goal status: INITIAL   3.  Patient will increase DGI score by > 2 points to demonstrate decreased fall risk during functional activities. Baseline: TBD at next visit; 11/07/2023= 20/24 Goal status: INITIAL   4.  Patient will reduce timed up and go to <11 seconds to reduce fall risk and demonstrate improved transfer/gait ability. Baseline: 11.69 sec Goal status: INITIAL  5.  Patient will increase 10 meter walk test to  >1.40m/s as to improve gait speed for better community ambulation and to reduce fall risk. Baseline: 0.98 m/s Goal status: INITIAL  6.  Patient will increase six minute walk test distance to >1200 for progression to community ambulator and improve gait ability Baseline: TBD at next visit. 11/07/2023= 1050  feet Goal status: REVISED  ASSESSMENT:  CLINICAL IMPRESSION:  Conitnued to advance plan for balance training and large amplitude/large effort movements. Pt show sno LOB in session, but does need cues for improved quality visual scanning of obstacles in // bars as she steps on the QC on 2 occasions wherein stepping without looking first. Pt will continue to benefit from skilled therapy to address her LE muscle weakness and impaired mobility in order to improve overall QoL and return to PLOF.    OBJECTIVE IMPAIRMENTS: Abnormal gait, decreased activity tolerance, decreased balance, decreased endurance, decreased knowledge of condition, decreased mobility, difficulty walking, decreased strength, impaired perceived functional ability, and impaired UE functional use.   ACTIVITY LIMITATIONS: carrying, lifting, squatting, reach over head, and locomotion level  PARTICIPATION LIMITATIONS: shopping, community activity, occupation, and yard work  PERSONAL FACTORS: Age, Past/current experiences, Time since onset of injury/illness/exacerbation, and 3+ comorbidities: CKD, Anemia, clinical depression, tremor, epilepsy, DM2, Migraines, CHF, HTN are also affecting patient's functional outcome.   REHAB POTENTIAL: Good  CLINICAL DECISION MAKING: Evolving/moderate complexity  EVALUATION COMPLEXITY: Moderate  PLAN:  PT FREQUENCY: 1-2x/week  PT DURATION: 12 weeks  PLANNED INTERVENTIONS: 97750- Physical Performance Testing, 97110-Therapeutic exercises, 97530- Therapeutic activity, 97112- Neuromuscular re-education, 97535- Self Care, [id number]- Manual therapy, [phone]- Gait training, Vestibular training, and DME  instructions  PLAN FOR NEXT SESSION:  *Follow-up on OT referral to address hand tremors* - Progress static balance with eyes closed - Progress dynamic balance activities  - Dynamic gait training  - sudden start/stops during backwards gait - consider Mini-BEST Test at Progress Note  3:34 PM, 11/21/23 Peggye JAYSON Linear, PT, DPT Physical Therapist - [facility]  Outpatient Physical Therapy- Main Campus [phone]

## 2023-11-21 NOTE — Telephone Encounter (Signed)
 Appeal has been denied by the insurance company for Abigail Figueroa.  Case #[id number]

## 2023-11-23 ENCOUNTER — Ambulatory Visit: Admitting: Physical Therapy

## 2023-11-23 DIAGNOSIS — M6281 Muscle weakness (generalized): Secondary | ICD-10-CM | POA: Diagnosis not present

## 2023-11-23 DIAGNOSIS — R262 Difficulty in walking, not elsewhere classified: Secondary | ICD-10-CM | POA: Diagnosis not present

## 2023-11-23 DIAGNOSIS — R2689 Other abnormalities of gait and mobility: Secondary | ICD-10-CM

## 2023-11-23 DIAGNOSIS — R251 Tremor, unspecified: Secondary | ICD-10-CM | POA: Diagnosis not present

## 2023-11-23 NOTE — Telephone Encounter (Signed)
 Patient should discuss further with her endocrinologist as we cannot get approval from insurance

## 2023-11-23 NOTE — Therapy (Signed)
 OUTPATIENT PHYSICAL THERAPY TREATMENT   Patient Name: Abigail Figueroa MRN: [id number] DOB:August 22, 1960, 63 y.o., female Today's Date: 11/23/2023   PCP: [name], MD  REFERRING PROVIDER: [name], MD   END OF SESSION:   PT End of Session - 11/23/23 1534     Visit Number 6    Number of Visits 25    Date for PT Re-Evaluation 12/07/23    Authorization Type VL based on auth    Authorization Time Period 09/14/23-12/07/23    Progress Note Due on Visit 10    PT Start Time 1534    PT Stop Time 1614    PT Time Calculation (min) 40 min    Equipment Utilized During Treatment Gait belt    Activity Tolerance Patient tolerated treatment well    Behavior During Therapy [facility] for tasks assessed/performed            Past Medical History:  Diagnosis Date   Abnormal stress test    a. 10/2009: Normal myocardial perfusion imaging; b. 08/2015 MV: medium defect of mod severity in mid ant apical region w/ mild HK of the distal inf wall;  c. 08/2015 Cath: nl Cors, EF 60%.   Allergy     Not sure   Anemia    Angio-edema    Anxiety    Arthritis    Bell's palsy    CHF (congestive heart failure) (HCC)    Not sure   Chronic kidney disease 2024   Depression    Dermatitis 09/10/2014   Edema    Essential hypertension    Frequent urination    Frequent urination at night    Headache    migraines, gets botox injections   Heart palpitations 10/2011   Event monitor showing sinus tachycardia, PACs with couplets and triplets.   Hx of echocardiogram    a. 10/2009 Echo: showed normal left ventricular function, mild left ventricular hypertrophy, and no significant valve abnormalities.   Hypercholesterolemia    Migraine    Morbid obesity (HCC)    Neuromuscular disorder (HCC)    OSA (obstructive sleep apnea)    has been on continuous positive airway pressure   Seizure disorder (HCC)    Seizures (HCC)    Sleep apnea    Not sure   Type II diabetes mellitus (HCC)    Urticaria     Past Surgical History:  Procedure Laterality Date   ABDOMINAL HYSTERECTOMY  1990   without BSO   BACK SURGERY  1900s 2001   x2 with cage put in   BILATERAL SALPINGOOPHORECTOMY  1990   CARDIAC CATHETERIZATION N/A 08/06/2015   Procedure: Left Heart Cath and Coronary Angiography;  Surgeon: Debby DELENA Sor, MD;  Location: [facility];  Service: Cardiovascular;  Laterality: N/A;   CARPAL TUNNEL RELEASE Bilateral    CHOLECYSTECTOMY  1980   COLONOSCOPY WITH PROPOFOL  N/A 08/30/2017   Procedure: COLONOSCOPY WITH PROPOFOL ;  Surgeon: Unk Corinn Skiff, MD;  Location: [facility] END[facility];  Service: Endoscopy;  Laterality: N/A;   DG THUMB LEFT HAND  01/24/2018   repair joint in left thumb   DORSAL COMPARTMENT RELEASE Left 10/25/2014   Procedure: LEFT FIRST  DORSAL COMPARTMENT RELEASE AND RADIAL TENOSYNOVECTOMY ;  Surgeon: Elsie Mussel, MD;  Location: [facility];  Service: Orthopedics;  Laterality: Left;   HAND SURGERY     KNEE SURGERY Right 2012   meniscus tear   KNEE SURGERY  2012   LAMINECTOMY  1995   TUBAL LIGATION  1982   WRIST SURGERY Right    Patient Active Problem List   Diagnosis Date Noted   Seasonal allergies 07/25/2023   Gout involving toe of left foot 07/25/2023   Tremor 10/14/2022   Other chest pain 07/29/2022   Anemia in stage 4 chronic kidney disease (HCC) 07/19/2022   CKD (chronic kidney disease) stage 4, GFR 15-29 ml/min (HCC) 06/03/2022   Seasonal and perennial allergic rhinitis 10/11/2019   Angio-edema 08/30/2019   Obesity (BMI 30-39.9) 02/06/2019   Osteoarthrosis of hand 12/28/2017   Right-sided Bell's palsy 02/02/2016   Spondylolisthesis of lumbar region 08/25/2015   Type 2 diabetes mellitus with stage 4 chronic kidney disease, without long-term current use of insulin (HCC)    History of brain disorder 11/05/2014   Depression, major, recurrent, moderate (HCC) 11/05/2014   Generalized anxiety disorder 10/16/2014   Hypertension associated with  diabetes (HCC) 10/14/2014   Epilepsy (HCC) 10/14/2014   Migraine 10/14/2014   Dysthymic disorder 10/14/2014   Dyspepsia 10/14/2014   Hematochezia 10/14/2014   Urge incontinence 10/14/2014   Alopecia 09/10/2014   Blood in feces 09/10/2014   Arthralgia of hip 09/10/2014   Snapping thumb syndrome 04/19/2014   De Quervain's disease (radial styloid tenosynovitis) 03/08/2014   Hyperlipidemia associated with type 2 diabetes mellitus (HCC) 01/15/2013   Heart palpation 01/15/2013   Congestive heart failure (HCC) 11/13/2009   HYPERCALCEMIA 05/08/2007   Clinical depression 10/20/2006   Obstructive apnea 03/22/2006    ONSET DATE: 10/02/2022  REFERRING DIAG: G20.A2 (ICD-10-CM) - Parkinson's disease with fluctuating manifestations, unspecified whether dyskinesia present (HCC)   THERAPY DIAG:  Balance disorder  Difficulty in walking, not elsewhere classified  Muscle weakness (generalized)  Shakiness  Rationale for Evaluation and Treatment: Rehabilitation  SUBJECTIVE:                                                                                                                                                                                             SUBJECTIVE STATEMENT:  Pt reports she has been doing pretty good. Pt states she went home and went to sleep after last therapy session. Pt states sometimes when she is walking she starts to wonder towards the right side. Pt reports increased challenge walking on uneven/compliant surfaces. Denies pain. Denies stumbles/falls.    From EVAL: Pt was diagnosed with Parkinson's back in June of 2024.  Pt noted that she was experiencing tremors prior to that, but the formal diagnosis happened in June.  Pt is seeking therapy in order to be able to control her hands and to improve her balance.     Pt accompanied by: significant other, husband,  Koren  PERTINENT HISTORY:  PMH: Hypertension associated with diabetes, Hyperlipidemia associated with  type 2 diabetes mellitus, Type 2 diabetes mellitus with stage 4 chronic kidney disease, without long-term current use of insulin, CKD (chronic kidney disease) stage 4, GFR 15-29 ml/min (HCC), Obesity, anemia,  Parkinson's disease with fluctuating manifestations, unspecified whether dyskinesia present, hx of gout in L great toe and Achille's tendonitis  PAIN:  Are you having pain? Yes, but only where she received the pneumonia shot.    PRECAUTIONS: None  RED FLAGS: None   WEIGHT BEARING RESTRICTIONS: No  FALLS: Has patient fallen in last 6 months? No  LIVING ENVIRONMENT: Lives with: lives with their family Lives in: House/apartment Stairs: Yes: External: 1 on the front , 5 steps on the back steps; can reach both on the back of the house Has following equipment at home: Single point cane and Walker - 2 wheeled  PLOF: Independent  PATIENT GOALS: Pt is hoping to be able to control her hands better and to be able to ambulate without fear of losing balance.  OBJECTIVE:  Note: Objective measures were completed at Evaluation unless otherwise noted.  DIAGNOSTIC FINDINGS: N/A  COGNITION: Overall cognitive status: Within functional limits for tasks assessed   SENSATION: WFL  COORDINATION: WFL   POSTURE: rounded shoulders, forward head, and anterior pelvic tilt  LOWER EXTREMITY MMT:    MMT Right Eval Left Eval  Hip flexion 4+ 4+  Hip extension    Hip abduction 4+ 4+  Hip adduction 4+ 4+  Hip internal rotation    Hip external rotation    Knee flexion 4+ 4+  Knee extension 4+ 4+  Ankle dorsiflexion 5 5  Ankle plantarflexion    Ankle inversion    Ankle eversion    (Blank rows = not tested)  BED MOBILITY:  Not tested  TRANSFERS: Sit to stand: Complete Independence  Assistive device utilized: None     Stand to sit: Complete Independence  Assistive device utilized: None     Chair to chair: Complete Independence  Assistive device utilized: None       RAMP:  Not  tested  CURB:  Not tested  STAIRS: Findings: Level of Assistance: Complete Independence, Stair Negotiation Technique: Alternating Pattern  Forwards with Bilateral Rails, Number of Stairs: 8, Height of Stairs: 6   , and Comments: Pt performed well with using singular UE to support self at times, and bilateral on the final step descending.  GAIT: Findings: Pt with good gait mechanics during short distance walking, just slowed performance.  FUNCTIONAL TESTS:  5 times sit to stand: 12.69 sec with one instance of instability back into the chair. Timed up and go (TUG): 11.69 sec 6 minute walk test: TBD at next session 10 meter walk test: 10.15 sec/ 0.98 m/s Dynamic Gait Index: TBD at next session SLS on R LE: 9.81 sec; L LE: 17.82 sec  PATIENT SURVEYS:  ABC scale 65.6%  TREATMENT DATE: 11/23/2023  Educated pt that OT referral has been received and initial eval is currently scheduled for 12/12/23.  Dynamic gait training including:  ~453ft forward gait with dual-task challenge of head turning to visually scan and locate post-it notes on wall progressed to cards held up by therapist to challenge increased head turning ~367ft backwards gait with same dual-task visual scanning task and progression Performed sudden stops to challenge pt's control of posterior momentum Close SBA throughout for safety - pt has minor postural sway/path deviation but able to adjust steps to maintain balance Obstacle course navigation including 2 hurdles and 5 hedgehogs to perform foot taps on Side stepping down/back ~8 laps Forward/backwards walking ~4 laps Pt with greatest instability during backwards gait - initially reaching for balance bar with 3x minor LOB, but improved when cued to use ankle/hip or stepping balance recovery strategy CGA/light min A for steadying with this Gait  training ~10 laps walking across red balance mat with 1/2 foam roll in middle Added dual-task challenge of head turns to visually locate card held up by therapist Minor instability with this, but able to utilize stepping strategy to maintain balance with only close SBA Pt reports she walks her dog in the grass, but her dog is well behaved on a leash and doesn't pull her; otherwise, could have simulated this   Standing balance tasks including: NBOS on airex pad x30sec No sway with this  NBOS on airex  X10 horizontal head turns Pt with posterior LOB towards end requiring min A to maintain upright X10 vertical head turns Requires min A for minor posterior LOB at end This causes pt to become dizzy, requiring seated rest break and symptoms resolve  Pt reports she sometimes gets dizzy when she is walking or when she bends over. Pt reports sometimes it feels like the room is spinning. Pt reports sometimes she can get lightheaded when bending over, but that is a different feeling than the dizziness. Pt denies dizziness occurring when rolling over in the bed or during sit>supine transition.    PATIENT EDUCATION: Education details: Pt educated on role of PT and services provided during current POC, along with prognosis and information about the clinic. HEP review/movement correction Person educated: Patient and Spouse Education method: Explanation Education comprehension: verbalized understanding  HOME EXERCISE PROGRAM:  Access Code: [email or web address] URL: [email or web address] Date: 11/09/2023 Prepared by: Connell Kiss  Exercises - Backward Walking with Counter Support  - 1 x daily - 3 x weekly - 2 sets - 10 reps - Staggered Stance Weight Shift with Arms Reaching  - 1 x daily - 3 x weekly - 2 sets - 10 reps - Standing Reach to Opposite Side with Weight Shift  - 1 x daily - 3 x weekly - 2 sets - 10 reps - Narrow Stance with Counter Support - EYES CLOSED  - 1 x daily - 3 x weekly - 2  sets - 20-30 seconds  hold - Sit to Stand with Arm Swing  - 1 x daily - 3 x weekly - 2 sets - 10 reps  GOALS: Goals reviewed with patient? Yes  SHORT TERM GOALS: Target date: 10/26/2023 Pt will be independent with HEP in order to demonstrate increased ability to perform tasks related to occupation/hobbies. Baseline:  Pt to be given HEP at next visit. Goal status: INITIAL  LONG TERM GOALS: Target date: 12/07/2023 1.  Patient (> 19 years old) will complete five times sit to stand test in <10 seconds indicating an  increased LE strength and improved balance. Baseline: 12.69 sec Goal status: INITIAL  2.  Patient will increase ABC score to improve by 8%   to demonstrate statistically significant improvement in mobility and quality of life.  Baseline: 65.6% Goal status: INITIAL   3.  Patient will increase DGI score by > 2 points to demonstrate decreased fall risk during functional activities. Baseline: TBD at next visit; 11/07/2023= 20/24 Goal status: INITIAL   4.  Patient will reduce timed up and go to <11 seconds to reduce fall risk and demonstrate improved transfer/gait ability. Baseline: 11.69 sec Goal status: INITIAL  5.  Patient will increase 10 meter walk test to >1.58m/s as to improve gait speed for better community ambulation and to reduce fall risk. Baseline: 0.98 m/s Goal status: INITIAL  6.  Patient will increase six minute walk test distance to >1200 for progression to community ambulator and improve gait ability Baseline: TBD at next visit. 11/07/2023= 1050 feet Goal status: REVISED  ASSESSMENT:  CLINICAL IMPRESSION:  Therapy session continued to focus on dynamic gait training and dynamic balance challenges with focus on head turning, variable direction walking, obstacle navigation, and ambulation on compliant surfaces. Patient with greatest instability when walking backwards during obstacle navigation. Patient significantly challenged by NBOS standing on airex during head turns  with pt reporting this causes her to become dizzy (primarily vertical head turns). Patient reports she has had some dizziness the past few years when walking or when bending over and may benefit from further vestibular assessment vs continued balance interventions targeting integration of all 3 balance systems. Pt will continue to benefit from skilled therapy to address her LE muscle weakness and impaired mobility in order to improve overall QoL and return to PLOF.    OBJECTIVE IMPAIRMENTS: Abnormal gait, decreased activity tolerance, decreased balance, decreased endurance, decreased knowledge of condition, decreased mobility, difficulty walking, decreased strength, impaired perceived functional ability, and impaired UE functional use.   ACTIVITY LIMITATIONS: carrying, lifting, squatting, reach over head, and locomotion level  PARTICIPATION LIMITATIONS: shopping, community activity, occupation, and yard work  PERSONAL FACTORS: Age, Past/current experiences, Time since onset of injury/illness/exacerbation, and 3+ comorbidities: CKD, Anemia, clinical depression, tremor, epilepsy, DM2, Migraines, CHF, HTN are also affecting patient's functional outcome.   REHAB POTENTIAL: Good  CLINICAL DECISION MAKING: Evolving/moderate complexity  EVALUATION COMPLEXITY: Moderate  PLAN:  PT FREQUENCY: 1-2x/week  PT DURATION: 12 weeks  PLANNED INTERVENTIONS: 97750- Physical Performance Testing, 97110-Therapeutic exercises, 97530- Therapeutic activity, 97112- Neuromuscular re-education, 97535- Self Care, [id number]- Manual therapy, 97116- Gait training, Vestibular training, and DME instructions  PLAN FOR NEXT SESSION:  - vestibular assessment if appropriate - Progress static balance with eyes closed and on airex pad - Progress dynamic balance activities  - Dynamic gait training  - sudden start/stops during backwards gait  - backwards stepping over obstacles - consider Mini-BEST Test at Progress Note   Patrecia Veiga, PT, DPT, NCS, CSRS Physical Therapist - [place]  [facility]  4:14 PM 11/23/23

## 2023-11-24 NOTE — Telephone Encounter (Signed)
 Contacted patient to make aware we do not have any samples at the moment but if we receive any before she sees endo we would make her aware. Reports she has 7 days left on her current one and one more available to use at the moment. I verbalized understanding and also advised she may be able to receive from endo if they have the option available and she reports she will try and see

## 2023-11-24 NOTE — Telephone Encounter (Signed)
 Spoke with patient- per Dr. KATHEE will provide samples until appointment with endo on 8/22.

## 2023-11-25 ENCOUNTER — Ambulatory Visit: Admitting: Clinical

## 2023-11-25 ENCOUNTER — Inpatient Hospital Stay: Attending: Oncology

## 2023-11-25 ENCOUNTER — Inpatient Hospital Stay

## 2023-11-25 DIAGNOSIS — N184 Chronic kidney disease, stage 4 (severe): Secondary | ICD-10-CM | POA: Insufficient documentation

## 2023-11-25 DIAGNOSIS — F330 Major depressive disorder, recurrent, mild: Secondary | ICD-10-CM

## 2023-11-25 DIAGNOSIS — D631 Anemia in chronic kidney disease: Secondary | ICD-10-CM | POA: Insufficient documentation

## 2023-11-25 DIAGNOSIS — F411 Generalized anxiety disorder: Secondary | ICD-10-CM | POA: Diagnosis not present

## 2023-11-25 LAB — HEMOGLOBIN AND HEMATOCRIT (CANCER CENTER ONLY)
HCT: 31 % — ABNORMAL LOW (ref 36.0–46.0)
Hemoglobin: 10 g/dL — ABNORMAL LOW (ref 12.0–15.0)

## 2023-11-25 NOTE — Progress Notes (Signed)
 Hgb 10 today.  Confirmed with Dr. Babara that parameters are hold if >/= 10.0.

## 2023-11-25 NOTE — Progress Notes (Signed)
   Abigail Barthel, LCSW

## 2023-11-25 NOTE — Progress Notes (Signed)
 [facility] Counselor/Therapist Progress Note  Patient ID: Abigail Figueroa, MRN: [id number],    Date: 11/25/2023  Time Spent: 12:45pm - 1:25pm : 40 minutes   Treatment Type: Individual Therapy  Reported Symptoms: depressed mood  Mental Status Exam: Appearance:  Neat and Well Groomed     Behavior: Appropriate  Motor: Normal  Speech/Language:  Clear and Coherent and Normal Rate  Affect: Appropriate  Mood: depressed  Thought process: normal  Thought content:   WNL  Sensory/Perceptual disturbances:   WNL  Orientation: oriented to person, place, time/date, and situation  Attention: Good  Concentration: Good  Memory: WNL  Fund of knowledge:  Good  Insight:   Good  Judgment:  Good  Impulse Control: Good   Risk Assessment: Danger to Self:  No Patient denied current suicidal ideation  Self-injurious Behavior: No Danger to Others: No Patient denied current homicidal ideation Duty to Warn:no Physical Aggression / Violence:No  Access to Firearms a concern: No  Gang Involvement:No   Subjective: Patient stated, its been a lot going on this week in response to events since last session. Patient stated,  I'm not up to really talking today, I'm going to really try. Patient stated, its been a rough couple of days. Patient reported a conflict occurred recently between patient's husband and grandson. Patient stated, he needs counseling bad, he doesn't see where he needs counseling in reference to patient's grandson. Patient reported patient's grandson is not open to counseling at this time. Patient stated, I finally got my application for the weatherization and patient reported patient spoke with a representative from the program. Patient stated, I've been hanging in there in response to patient's mood. Patient stated, I have been depressed some but not bad, just more down in reference to patient's mood. Patient stated, I've been working on that and stated,  its been helpful in response to use of opposite action.   Interventions: Cognitive Behavioral Therapy. Clinician conducted session via caregility video from clinician's home office for 35 minutes and 5 minutes via phone due to loss of video connection. Patient provided verbal consent to proceed with telehealth session and is aware of limitations of telephone or video visits. Patient participated in session from patient's home. Discussed current stressors. Provided supportive therapy, active listening, and validation as patient discussed concerns related to grandson and patient's response. Explored patient's thoughts and feelings triggered by grandson using substances. Provided resource information for MH/SA services, such as, 988, [facility] 24 hour behavioral health urgent care, [facility] crisis and member services contact information. Provided psycho education related to supporting an individual with MH/SA needs. Provided resource information for samhsa and nami related to family support. Discussed outcome of patient's outreach to weatherization program. Reviewed opposite action and the outcome.  Clinician requested for homework patient practice opposite action skill.    Collaboration of Care: not required at this time   Diagnosis:  Mild episode of recurrent major depressive disorder (HCC)    Generalized anxiety disorder   Plan: Patient is to utilize [employer] [facility], thought re-framing, relaxation techniques, mindfulness, effective communication, and coping strategies to decrease symptoms associated with Major Depressive Disorder and Generalized Anxiety Disorder. Frequency: bi-weekly  Modality: individual      Long-term goal:   Patient stated, I want to see a change in the anxiety.      Reduce overall level, frequency, and intensity of the symptoms of anxiety and depression as evidenced by decreased sadness, feelings of anxiety, feeling lost, lack of energy, difficulty  falling and  staying asleep, changes in concentration, psychomotor retardation, rapid heart rate, excessive worry, restlessness, feeling jittery, and feeling on edge from 5 to 6 days per week to 0 to 1 days per week per patient reported for at least 3 consecutive months.   Target Date: 09/11/24  Progress: progressing    Short-term goal:  Develop coping strategies to utilize in response to symptoms of depression/anxiety and stressors  Target Date: 09/11/24  Progress: progressing    Verbally express patient's thoughts and feelings to others and utilize effective communication strategies when expressing patient's thoughts/feelings    Target Date: 09/11/24  Progress: progressing    Verbalize an understanding of the relationship between symptoms of anxiety and the impact on patient's thought patterns and behaviors    Target Date: 09/11/24  Progress: progressing    Develop strategies for patient to utilize to support patient's daughter and grandson as it relates to grandson using illegal substances.  Target Date: 09/11/24  Progress: established 09/12/23    Develop and implement coping strategies for patient to utilize in response to grandson using illegal substances Target Date: 09/11/24  Progress: established 09/12/23     Darice Seats, LCSW

## 2023-11-28 ENCOUNTER — Ambulatory Visit: Admitting: Physical Therapy

## 2023-11-28 DIAGNOSIS — M6281 Muscle weakness (generalized): Secondary | ICD-10-CM

## 2023-11-28 DIAGNOSIS — R262 Difficulty in walking, not elsewhere classified: Secondary | ICD-10-CM

## 2023-11-28 DIAGNOSIS — R251 Tremor, unspecified: Secondary | ICD-10-CM | POA: Diagnosis not present

## 2023-11-28 DIAGNOSIS — R2689 Other abnormalities of gait and mobility: Secondary | ICD-10-CM

## 2023-11-28 MED ORDER — FREESTYLE LIBRE 3 PLUS SENSOR MISC: 1 refills | Status: DC

## 2023-11-28 NOTE — Therapy (Signed)
 OUTPATIENT PHYSICAL THERAPY TREATMENT   Patient Name: Abigail Figueroa MRN: [id number] DOB:11/01/1960, 63 y.o., female Today's Date: 11/28/2023   PCP: Abigail Jon HERO, MD  REFERRING PROVIDER: [name], MD   END OF SESSION:   PT End of Session - 11/28/23 1533     Visit Number 7    Number of Visits 25    Date for PT Re-Evaluation 12/07/23    Authorization Type [employer] [id number] 5/19-8/11 for 24 PT vsts    Authorization Time Period --    Progress Note Due on Visit 10    PT Start Time 1535    PT Stop Time 1619    PT Time Calculation (min) 44 min    Equipment Utilized During Treatment Gait belt    Activity Tolerance Patient tolerated treatment well    Behavior During Therapy WFL for tasks assessed/performed             Past Medical History:  Diagnosis Date   Abnormal stress test    a. 10/2009: Normal myocardial perfusion imaging; b. 08/2015 MV: medium defect of mod severity in mid ant apical region w/ mild HK of the distal inf wall;  c. 08/2015 Cath: nl Cors, EF 60%.   Allergy     Not sure   Anemia    Angio-edema    Anxiety    Arthritis    Bell's palsy    CHF (congestive heart failure) (HCC)    Not sure   Chronic kidney disease 2024   Depression    Dermatitis 09/10/2014   Edema    Essential hypertension    Frequent urination    Frequent urination at night    Headache    migraines, gets botox injections   Heart palpitations 10/2011   Event monitor showing sinus tachycardia, PACs with couplets and triplets.   Hx of echocardiogram    a. 10/2009 Echo: showed normal left ventricular function, mild left ventricular hypertrophy, and no significant valve abnormalities.   Hypercholesterolemia    Migraine    Morbid obesity (HCC)    Neuromuscular disorder (HCC)    OSA (obstructive sleep apnea)    has been on continuous positive airway pressure   Seizure disorder (HCC)    Seizures (HCC)    Sleep apnea    Not sure   Type II diabetes mellitus (HCC)     Urticaria    Past Surgical History:  Procedure Laterality Date   ABDOMINAL HYSTERECTOMY  1990   without BSO   BACK SURGERY  1900s 2001   x2 with cage put in   BILATERAL SALPINGOOPHORECTOMY  1990   CARDIAC CATHETERIZATION N/A 08/06/2015   Procedure: Left Heart Cath and Coronary Angiography;  Surgeon: Abigail DELENA Sor, MD;  Location: [facility];  Service: Cardiovascular;  Laterality: N/A;   CARPAL TUNNEL RELEASE Bilateral    CHOLECYSTECTOMY  1980   COLONOSCOPY WITH PROPOFOL  N/A 08/30/2017   Procedure: COLONOSCOPY WITH PROPOFOL ;  Surgeon: Abigail Corinn Skiff, MD;  Location: [facility];  Service: Endoscopy;  Laterality: N/A;   DG THUMB LEFT HAND  01/24/2018   repair joint in left thumb   DORSAL COMPARTMENT RELEASE Left 10/25/2014   Procedure: LEFT FIRST  DORSAL COMPARTMENT RELEASE AND RADIAL TENOSYNOVECTOMY ;  Surgeon: Abigail Mussel, MD;  Location: [facility];  Service: Orthopedics;  Laterality: Left;   HAND SURGERY     KNEE SURGERY Right 2012   meniscus tear   KNEE SURGERY  2012   LAMINECTOMY  1995  TUBAL LIGATION  1982   WRIST SURGERY Right    Patient Active Problem List   Diagnosis Date Noted   Seasonal allergies 07/25/2023   Gout involving toe of left foot 07/25/2023   Tremor 10/14/2022   Other chest pain 07/29/2022   Anemia in stage 4 chronic kidney disease (HCC) 07/19/2022   CKD (chronic kidney disease) stage 4, GFR 15-29 ml/min (HCC) 06/03/2022   Seasonal and perennial allergic rhinitis 10/11/2019   Angio-edema 08/30/2019   Obesity (BMI 30-39.9) 02/06/2019   Osteoarthrosis of hand 12/28/2017   Right-sided Bell's palsy 02/02/2016   Spondylolisthesis of lumbar region 08/25/2015   Type 2 diabetes mellitus with stage 4 chronic kidney disease, without long-term current use of insulin (HCC)    History of brain disorder 11/05/2014   Depression, major, recurrent, moderate (HCC) 11/05/2014   Generalized anxiety disorder 10/16/2014   Hypertension  associated with diabetes (HCC) 10/14/2014   Epilepsy (HCC) 10/14/2014   Migraine 10/14/2014   Dysthymic disorder 10/14/2014   Dyspepsia 10/14/2014   Hematochezia 10/14/2014   Urge incontinence 10/14/2014   Alopecia 09/10/2014   Blood in feces 09/10/2014   Arthralgia of hip 09/10/2014   Snapping thumb syndrome 04/19/2014   De Quervain's disease (radial styloid tenosynovitis) 03/08/2014   Hyperlipidemia associated with type 2 diabetes mellitus (HCC) 01/15/2013   Heart palpation 01/15/2013   Congestive heart failure (HCC) 11/13/2009   HYPERCALCEMIA 05/08/2007   Clinical depression 10/20/2006   Obstructive apnea 03/22/2006    ONSET DATE: 10/02/2022  REFERRING DIAG: G20.A2 (ICD-10-CM) - Parkinson's disease with fluctuating manifestations, unspecified whether dyskinesia present (HCC)   THERAPY DIAG:  Balance disorder  Difficulty in walking, not elsewhere classified  Muscle weakness (generalized)  Shakiness  Rationale for Evaluation and Treatment: Rehabilitation  SUBJECTIVE:                                                                                                                                                                                             SUBJECTIVE STATEMENT:  Pt reports she is doing pretty good. States she had to go home and go to sleep after last therapy session. Pt states she feels therapy is helping her despite the fatigue after each session. Pt states her daughters told her they don't see her tremoring as much. Denies falls, but reports still a little off balance. Denies pain.  Pt states she has dizziness when she bends over and then stands back up - describes it as off balance with pt stating she feels like the room is spinning. Pt states she often notices this after she bends forward to care for her dog, [name]. Pt  states the symptoms last ~1 minute. Pt reports this has been happening for a few months with sudden onset. Pt with no known incident  that caused the onset of these symptoms. No changes in hearing, denies nausea/vomiting. Denies symptoms going from sit>supine nor rolling over in the bed.     From EVAL: Pt was diagnosed with Parkinson's back in June of 2024.  Pt noted that she was experiencing tremors prior to that, but the formal diagnosis happened in June.  Pt is seeking therapy in order to be able to control her hands and to improve her balance.     Pt accompanied by: significant other, husband, Koren  PERTINENT HISTORY:  PMH: Hypertension associated with diabetes, Hyperlipidemia associated with type 2 diabetes mellitus, Type 2 diabetes mellitus with stage 4 chronic kidney disease, without long-term current use of insulin, CKD (chronic kidney disease) stage 4, GFR 15-29 ml/min (HCC), Obesity, anemia,  Parkinson's disease with fluctuating manifestations, unspecified whether dyskinesia present, hx of gout in L great toe and Achille's tendonitis  PAIN:  Are you having pain? Yes, but only where she received the pneumonia shot.    PRECAUTIONS: None  RED FLAGS: None   WEIGHT BEARING RESTRICTIONS: No  FALLS: Has patient fallen in last 6 months? No  LIVING ENVIRONMENT: Lives with: lives with their family Lives in: House/apartment Stairs: Yes: External: 1 on the front , 5 steps on the back steps; can reach both on the back of the house Has following equipment at home: Single point cane and Walker - 2 wheeled  PLOF: Independent  PATIENT GOALS: Pt is hoping to be able to control her hands better and to be able to ambulate without fear of losing balance.  OBJECTIVE:  Note: Objective measures were completed at Evaluation unless otherwise noted.  DIAGNOSTIC FINDINGS: N/A  COGNITION: Overall cognitive status: Within functional limits for tasks assessed   SENSATION: WFL  COORDINATION: WFL   POSTURE: rounded shoulders, forward head, and anterior pelvic tilt  LOWER EXTREMITY MMT:    MMT Right Eval Left Eval   Hip flexion 4+ 4+  Hip extension    Hip abduction 4+ 4+  Hip adduction 4+ 4+  Hip internal rotation    Hip external rotation    Knee flexion 4+ 4+  Knee extension 4+ 4+  Ankle dorsiflexion 5 5  Ankle plantarflexion    Ankle inversion    Ankle eversion    (Blank rows = not tested)  BED MOBILITY:  Not tested  TRANSFERS: Sit to stand: Complete Independence  Assistive device utilized: None     Stand to sit: Complete Independence  Assistive device utilized: None     Chair to chair: Complete Independence  Assistive device utilized: None       RAMP:  Not tested  CURB:  Not tested  STAIRS: Findings: Level of Assistance: Complete Independence, Stair Negotiation Technique: Alternating Pattern  Forwards with Bilateral Rails, Number of Stairs: 8, Height of Stairs: 6   , and Comments: Pt performed well with using singular UE to support self at times, and bilateral on the final step descending.  GAIT: Findings: Pt with good gait mechanics during short distance walking, just slowed performance.  FUNCTIONAL TESTS:  5 times sit to stand: 12.69 sec with one instance of instability back into the chair. Timed up and go (TUG): 11.69 sec 6 minute walk test: TBD at next session 10 meter walk test: 10.15 sec/ 0.98 m/s Dynamic Gait Index: TBD at next session SLS on R  LE: 9.81 sec; L LE: 17.82 sec  PATIENT SURVEYS:  ABC scale 65.6%                                                                                                                              TREATMENT DATE: 11/28/2023  Unless otherwise stated, CGA was provided and gait belt donned in order to ensure pt safety throughout session.  Had patient simulate bending forward to care for her dog, [name], and after 3 repetitions symptoms continuned to occur each time with pt reporting spinning dizziness upon coming to stand that dissipated in ~1 min or less. No visible nystagmus with this.   Vitals in standing: BP 108/64 (MAP 78), HR  66bpm  Vitals immediately following bending over to pretend she is caring for her dog: BP 107/63 (MAP 78), HR 67bpm   Vestibular Assessment  OCULOMOTOR EXAM:  Ocular Alignment: normal  Ocular ROM: No Limitations  Spontaneous Nystagmus: absent  Gaze-Induced Nystagmus: absent  Smooth Pursuits: intact  Saccades: extra eye movements in L eye compared to R, slow  Convergence/Divergence: able to converge  Cover/Cross-Cover: horizontal correction to move back to target bilaterally (in the eye that was covered)  VESTIBULAR - OCULAR REFLEX:   Slow VOR: Normal  VOR Cancellation: Normal  Head-Impulse Test: HIT Right: negative HIT Left: positive, but very slight  Dynamic Visual Acuity: Not able to be assessed  POSITIONAL TESTING:  Loaded Right Dix-Hallpike: no nystagmus Loaded Left Dix-Hallpike: no nystagmus, but pt symptomatic only upon returning to sitting, but resolves in <30seconds Right Roll Test: no nystagmus Left Roll Test: no nystagmus *no symptoms with positional testing unless stated   Provided pt with ANPT BPPV handout. Educated pt on plan to repeat positional testing using vestibular goggles at next session given these symptoms have been present for months following acute onset of spinning when she bends forward.    PATIENT EDUCATION: Education details: Pt educated on role of PT and services provided during current POC, along with prognosis and information about the clinic. HEP review/movement correction Person educated: Patient and Spouse Education method: Explanation Education comprehension: verbalized understanding  HOME EXERCISE PROGRAM:  Access Code: [email or web address] URL: [email or web address] Date: 11/09/2023 Prepared by: Connell Kiss  Exercises - Backward Walking with Counter Support  - 1 x daily - 3 x weekly - 2 sets - 10 reps - Staggered Stance Weight Shift with Arms Reaching  - 1 x daily - 3 x weekly - 2 sets - 10 reps - Standing Reach to Opposite  Side with Weight Shift  - 1 x daily - 3 x weekly - 2 sets - 10 reps - Narrow Stance with Counter Support - EYES CLOSED  - 1 x daily - 3 x weekly - 2 sets - 20-30 seconds  hold - Sit to Stand with Arm Swing  - 1 x daily - 3 x weekly - 2 sets - 10 reps  GOALS: Goals reviewed with patient? Yes  SHORT  TERM GOALS: Target date: 10/26/2023  Pt will be independent with HEP in order to demonstrate increased ability to perform tasks related to occupation/hobbies. Baseline:  Pt to be given HEP at next visit. Goal status: INITIAL  LONG TERM GOALS: Target date: 12/07/2023  1.  Patient (> 83 years old) will complete five times sit to stand test in <10 seconds indicating an increased LE strength and improved balance. Baseline: 12.69 sec Goal status: INITIAL  2.  Patient will increase ABC score to improve by 8%   to demonstrate statistically significant improvement in mobility and quality of life.  Baseline: 65.6% Goal status: INITIAL   3.  Patient will increase DGI score by > 2 points to demonstrate decreased fall risk during functional activities. Baseline: TBD at next visit; 11/07/2023= 20/24 Goal status: INITIAL   4.  Patient will reduce timed up and go to <11 seconds to reduce fall risk and demonstrate improved transfer/gait ability. Baseline: 11.69 sec Goal status: INITIAL  5.  Patient will increase 10 meter walk test to >1.13m/s as to improve gait speed for better community ambulation and to reduce fall risk. Baseline: 0.98 m/s Goal status: INITIAL  6.  Patient will increase six minute walk test distance to >1200 for progression to community ambulator and improve gait ability Baseline: TBD at next visit. 11/07/2023= 1050 feet Goal status: REVISED  ASSESSMENT:  CLINICAL IMPRESSION:  Therapy session focused on vestibular assessment due to patient reporting spinning dizziness lasting <1 minute when she returns to standing after bending forwards, such as when she is caring for her dog. Patient  states she had acute onset of these symptoms a few months ago with no known provocating event. Patient denies any changes in her hearing. Patient presents with extra eye movements in L eye during saccades as well as horizontal eye corrections to fix gaze on target bilaterally when eye is uncovered during cover/cross-cover testing. Performed positional testing with pt negative for nystagmus with all testing and no symptoms, except for when returning to sitting upright after L loaded Dix-Hallpike. Anticipate due to these symptoms being present for several months patient may have the ability to use gaze fixation to suppress her nystagmus and symptoms; therefore, plan to repeat testing with use of vestibular goggles during next visit. Pt will continue to benefit from skilled therapy to address her LE muscle weakness and impaired mobility in order to improve overall QoL and return to PLOF.      OBJECTIVE IMPAIRMENTS: Abnormal gait, decreased activity tolerance, decreased balance, decreased endurance, decreased knowledge of condition, decreased mobility, difficulty walking, decreased strength, impaired perceived functional ability, and impaired UE functional use.   ACTIVITY LIMITATIONS: carrying, lifting, squatting, reach over head, and locomotion level  PARTICIPATION LIMITATIONS: shopping, community activity, occupation, and yard work  PERSONAL FACTORS: Age, Past/current experiences, Time since onset of injury/illness/exacerbation, and 3+ comorbidities: CKD, Anemia, clinical depression, tremor, epilepsy, DM2, Migraines, CHF, HTN are also affecting patient's functional outcome.   REHAB POTENTIAL: Good  CLINICAL DECISION MAKING: Evolving/moderate complexity  EVALUATION COMPLEXITY: Moderate  PLAN:  PT FREQUENCY: 1-2x/week  PT DURATION: 12 weeks  PLANNED INTERVENTIONS: 97750- Physical Performance Testing, 97110-Therapeutic exercises, 97530- Therapeutic activity, 97112- Neuromuscular re-education,  97535- Self Care, [id number]- Manual therapy, [id number]- Gait training, Vestibular training, and DME instructions  PLAN FOR NEXT SESSION:  - vestibular assessment   - repeat positional testing with vestibular goggles - start with L posterior canal  - ask about migraine history - Progress static balance with eyes closed and on  airex pad - Progress dynamic balance activities  - Dynamic gait training  - sudden start/stops during backwards gait  - backwards stepping over obstacles - consider Mini-BEST Test at Progress Note    Jusiah Aguayo, PT, DPT, NCS, CSRS Physical Therapist - [facility]  [facility]  5:44 PM 11/28/23

## 2023-11-28 NOTE — Telephone Encounter (Signed)
 Incoming fax received for Case Number: [id number] stating authorization number [id number] to pre-approve a CGM is being given and valid from 11/23/23 through 02/21/24.

## 2023-11-28 NOTE — Telephone Encounter (Signed)
 Pt would like rx for freestyle libre 3 plus [facility]

## 2023-11-28 NOTE — Addendum Note (Signed)
 Addended by: LILIAN SEVERO RAMAN on: 11/28/2023 03:08 PM   Modules accepted: Orders

## 2023-11-30 ENCOUNTER — Ambulatory Visit: Admitting: Physical Therapy

## 2023-12-02 DIAGNOSIS — L680 Hirsutism: Secondary | ICD-10-CM | POA: Diagnosis not present

## 2023-12-05 ENCOUNTER — Ambulatory Visit: Attending: Family Medicine | Admitting: Physical Therapy

## 2023-12-05 DIAGNOSIS — M6281 Muscle weakness (generalized): Secondary | ICD-10-CM | POA: Diagnosis not present

## 2023-12-05 DIAGNOSIS — R251 Tremor, unspecified: Secondary | ICD-10-CM | POA: Insufficient documentation

## 2023-12-05 DIAGNOSIS — R262 Difficulty in walking, not elsewhere classified: Secondary | ICD-10-CM | POA: Insufficient documentation

## 2023-12-05 DIAGNOSIS — R278 Other lack of coordination: Secondary | ICD-10-CM | POA: Diagnosis not present

## 2023-12-05 DIAGNOSIS — R2689 Other abnormalities of gait and mobility: Secondary | ICD-10-CM | POA: Insufficient documentation

## 2023-12-05 NOTE — Therapy (Signed)
 OUTPATIENT PHYSICAL THERAPY TREATMENT   Patient Name: Abigail Figueroa MRN: [id number] DOB:12-17-1960, 63 y.o., female Today's Date: 12/05/2023   PCP: Abigail Jon HERO, MD  REFERRING PROVIDER: [name], MD   END OF SESSION:   PT End of Session - 12/05/23 1537     Visit Number 8    Number of Visits 25    Date for PT Re-Evaluation 12/07/23    Authorization Type [employer] [id number] 5/19-8/11 for 24 PT vsts    Progress Note Due on Visit 10    PT Start Time 1535    PT Stop Time 1616    PT Time Calculation (min) 41 min    Equipment Utilized During Treatment Gait belt    Activity Tolerance Patient tolerated treatment well    Behavior During Therapy WFL for tasks assessed/performed           Past Medical History:  Diagnosis Date   Abnormal stress test    a. 10/2009: Normal myocardial perfusion imaging; b. 08/2015 MV: medium defect of mod severity in mid ant apical region w/ mild HK of the distal inf wall;  c. 08/2015 Cath: nl Cors, EF 60%.   Allergy     Not sure   Anemia    Angio-edema    Anxiety    Arthritis    Bell's palsy    CHF (congestive heart failure) (HCC)    Not sure   Chronic kidney disease 2024   Depression    Dermatitis 09/10/2014   Edema    Essential hypertension    Frequent urination    Frequent urination at night    Headache    migraines, gets botox injections   Heart palpitations 10/2011   Event monitor showing sinus tachycardia, PACs with couplets and triplets.   Hx of echocardiogram    a. 10/2009 Echo: showed normal left ventricular function, mild left ventricular hypertrophy, and no significant valve abnormalities.   Hypercholesterolemia    Migraine    Morbid obesity (HCC)    Neuromuscular disorder (HCC)    OSA (obstructive sleep apnea)    has been on continuous positive airway pressure   Seizure disorder (HCC)    Seizures (HCC)    Sleep apnea    Not sure   Type II diabetes mellitus (HCC)    Urticaria    Past Surgical History:   Procedure Laterality Date   ABDOMINAL HYSTERECTOMY  1990   without BSO   BACK SURGERY  1900s 2001   x2 with cage put in   BILATERAL SALPINGOOPHORECTOMY  1990   CARDIAC CATHETERIZATION N/A 08/06/2015   Procedure: Left Heart Cath and Coronary Angiography;  Surgeon: Abigail DELENA Sor, MD;  Location: [facility];  Service: Cardiovascular;  Laterality: N/A;   CARPAL TUNNEL RELEASE Bilateral    CHOLECYSTECTOMY  1980   COLONOSCOPY WITH PROPOFOL  N/A 08/30/2017   Procedure: COLONOSCOPY WITH PROPOFOL ;  Surgeon: Abigail Corinn Skiff, MD;  Location: [facility];  Service: Endoscopy;  Laterality: N/A;   DG THUMB LEFT HAND  01/24/2018   repair joint in left thumb   DORSAL COMPARTMENT RELEASE Left 10/25/2014   Procedure: LEFT FIRST  DORSAL COMPARTMENT RELEASE AND RADIAL TENOSYNOVECTOMY ;  Surgeon: Abigail Mussel, MD;  Location: [facility];  Service: Orthopedics;  Laterality: Left;   HAND SURGERY     KNEE SURGERY Right 2012   meniscus tear   KNEE SURGERY  2012   LAMINECTOMY  1995   TUBAL LIGATION  1982   WRIST SURGERY  Right    Patient Active Problem List   Diagnosis Date Noted   Seasonal allergies 07/25/2023   Gout involving toe of left foot 07/25/2023   Tremor 10/14/2022   Other chest pain 07/29/2022   Anemia in stage 4 chronic kidney disease (HCC) 07/19/2022   CKD (chronic kidney disease) stage 4, GFR 15-29 ml/min (HCC) 06/03/2022   Seasonal and perennial allergic rhinitis 10/11/2019   Angio-edema 08/30/2019   Obesity (BMI 30-39.9) 02/06/2019   Osteoarthrosis of hand 12/28/2017   Right-sided Bell's palsy 02/02/2016   Spondylolisthesis of lumbar region 08/25/2015   Type 2 diabetes mellitus with stage 4 chronic kidney disease, without long-term current use of insulin (HCC)    History of brain disorder 11/05/2014   Depression, major, recurrent, moderate (HCC) 11/05/2014   Generalized anxiety disorder 10/16/2014   Hypertension associated with diabetes (HCC) 10/14/2014    Epilepsy (HCC) 10/14/2014   Migraine 10/14/2014   Dysthymic disorder 10/14/2014   Dyspepsia 10/14/2014   Hematochezia 10/14/2014   Urge incontinence 10/14/2014   Alopecia 09/10/2014   Blood in feces 09/10/2014   Arthralgia of hip 09/10/2014   Snapping thumb syndrome 04/19/2014   De Quervain's disease (radial styloid tenosynovitis) 03/08/2014   Hyperlipidemia associated with type 2 diabetes mellitus (HCC) 01/15/2013   Heart palpation 01/15/2013   Congestive heart failure (HCC) 11/13/2009   HYPERCALCEMIA 05/08/2007   Clinical depression 10/20/2006   Obstructive apnea 03/22/2006    ONSET DATE: 10/02/2022  REFERRING DIAG: G20.A2 (ICD-10-CM) - Parkinson's disease with fluctuating manifestations, unspecified whether dyskinesia present (HCC)   THERAPY DIAG:  Balance disorder  Difficulty in walking, not elsewhere classified  Muscle weakness (generalized)  Shakiness  Rationale for Evaluation and Treatment: Rehabilitation  SUBJECTIVE:                                                                                                                                                                                             SUBJECTIVE STATEMENT:  Pt reports she has been doing good. Pt confirms still having dizziness when bending over to care for her dog. Denies falls, but continues to report feeling a little off balance, nothing big. Denies pain. Pt denies any changes to dizziness following last session.   From Prior Sessions: 11/28/2023: Pt states she has dizziness when she bends over and then stands back up - describes it as off balance with pt stating she feels like the room is spinning. Pt states she often notices this after she bends forward to care for her dog, Abigail Figueroa. Pt states the symptoms last ~1 minute. Pt reports this has been happening for a few months with sudden  onset. Pt with no known incident that caused the onset of these symptoms. No changes in hearing, denies  nausea/vomiting. Denies symptoms going from sit>supine nor rolling over in the bed.     From EVAL: Pt was diagnosed with Parkinson's back in June of 2024.  Pt noted that she was experiencing tremors prior to that, but the formal diagnosis happened in June.  Pt is seeking therapy in order to be able to control her hands and to improve her balance.     Pt accompanied by: significant other, husband, Koren  PERTINENT HISTORY:  PMH: Hypertension associated with diabetes, Hyperlipidemia associated with type 2 diabetes mellitus, Type 2 diabetes mellitus with stage 4 chronic kidney disease, without long-term current use of insulin, CKD (chronic kidney disease) stage 4, GFR 15-29 ml/min (HCC), Obesity, anemia,  Parkinson's disease with fluctuating manifestations, unspecified whether dyskinesia present, hx of gout in L great toe and Achille's tendonitis  PAIN:  Are you having pain? Yes, but only where she received the pneumonia shot.    PRECAUTIONS: None  RED FLAGS: None   WEIGHT BEARING RESTRICTIONS: No  FALLS: Has patient fallen in last 6 months? No  LIVING ENVIRONMENT: Lives with: lives with their family Lives in: House/apartment Stairs: Yes: External: 1 on the front , 5 steps on the back steps; can reach both on the back of the house Has following equipment at home: Single point cane and Walker - 2 wheeled  PLOF: Independent  PATIENT GOALS: Pt is hoping to be able to control her hands better and to be able to ambulate without fear of losing balance.  OBJECTIVE:  Note: Objective measures were completed at Evaluation unless otherwise noted.  DIAGNOSTIC FINDINGS: N/A  COGNITION: Overall cognitive status: Within functional limits for tasks assessed   SENSATION: WFL  COORDINATION: WFL   POSTURE: rounded shoulders, forward head, and anterior pelvic tilt  LOWER EXTREMITY MMT:    MMT Right Eval Left Eval  Hip flexion 4+ 4+  Hip extension    Hip abduction 4+ 4+  Hip  adduction 4+ 4+  Hip internal rotation    Hip external rotation    Knee flexion 4+ 4+  Knee extension 4+ 4+  Ankle dorsiflexion 5 5  Ankle plantarflexion    Ankle inversion    Ankle eversion    (Blank rows = not tested)  BED MOBILITY:  Not tested  TRANSFERS: Sit to stand: Complete Independence  Assistive device utilized: None     Stand to sit: Complete Independence  Assistive device utilized: None     Chair to chair: Complete Independence  Assistive device utilized: None       RAMP:  Not tested  CURB:  Not tested  STAIRS: Findings: Level of Assistance: Complete Independence, Stair Negotiation Technique: Alternating Pattern  Forwards with Bilateral Rails, Number of Stairs: 8, Height of Stairs: 6   , and Comments: Pt performed well with using singular UE to support self at times, and bilateral on the final step descending.  GAIT: Findings: Pt with good gait mechanics during short distance walking, just slowed performance.  FUNCTIONAL TESTS:  5 times sit to stand: 12.69 sec with one instance of instability back into the chair. Timed up and go (TUG): 11.69 sec 6 minute walk test: TBD at next session 10 meter walk test: 10.15 sec/ 0.98 m/s Dynamic Gait Index: TBD at next session SLS on R LE: 9.81 sec; L LE: 17.82 sec  PATIENT SURVEYS:  ABC scale 65.6%   Vestibular Assessment  on 11/28/2023  OCULOMOTOR EXAM:  Ocular Alignment: normal  Ocular ROM: No Limitations  Spontaneous Nystagmus: absent  Gaze-Induced Nystagmus: absent  Smooth Pursuits: intact  Saccades: extra eye movements in L eye compared to R, slow  Convergence/Divergence: able to converge  Cover/Cross-Cover: horizontal correction to move back to target bilaterally (in the eye that was covered)  VESTIBULAR - OCULAR REFLEX:   Slow VOR: Normal  VOR Cancellation: Normal  Head-Impulse Test: HIT Right: negative HIT Left: positive, but very slight  Dynamic Visual Acuity: Not able to be assessed  POSITIONAL  TESTING:  Loaded Right Dix-Hallpike: no nystagmus Loaded Left Dix-Hallpike: no nystagmus, but pt symptomatic only upon returning to sitting, but resolves in <30seconds Right Roll Test: no nystagmus Left Roll Test: no nystagmus *no symptoms with positional testing unless stated                                                                                                               TREATMENT DATE: 12/05/2023  Unless otherwise stated, CGA was provided and gait belt donned in order to ensure pt safety throughout session.  Repeated Vestibular Positional testing using vestibular goggles LEFT Loaded Dix-Hallpike: no nystagmus, nor symptoms in the position, but when returned to upright had brief moment of dizziness with 3+beats R upward torsional nystagmus RIGHT Loaded Dix-Hallpike: no nystagmus, nor symptoms in the position, but when returned to upright had 3+beats R upward torsional nystagmus but no symptoms Stronger/more pronounced nystagmus with return to sitting on R compared to L  R Canalith Repositioning Maneuver x2  Pt reporting brief moments of dizziness lasting <30sec-102min upon sitting upright at end of each maneuver, with symptoms less severe on 2nd treatment   Stood and performed 1x simulation of bending forward to take care of her dog and upon returning to standing upright pt has mild posterior LOB requiring heavy min A to maintain upright and pt experiencing more prominent dizziness symptoms, lasting for longer duration of closer to 1 minute  Educated pt to avoid bending forward this afternoon to care for her dog and to track her symptoms over the next week. Educated pt on plan to perform repeat vestibular positional testing using vestibular goggles at next visit and then perform canalith repositioning maneuver for L side, if appropriate at that time. Then, if this does not improve patient's symptoms consider transitioning to habituation and gaze stabilization interventions at that  time.   PATIENT EDUCATION: Education details: Pt educated on role of PT and services provided during current POC, along with prognosis and information about the clinic. HEP review/movement correction Person educated: Patient and Spouse Education method: Explanation Education comprehension: verbalized understanding  HOME EXERCISE PROGRAM:  Access Code: [email or web address] URL: [email or web address] Date: 11/09/2023 Prepared by: Connell Kiss  Exercises - Backward Walking with Counter Support  - 1 x daily - 3 x weekly - 2 sets - 10 reps - Staggered Stance Weight Shift with Arms Reaching  - 1 x daily - 3 x weekly - 2 sets - 10 reps - Standing Reach to  Opposite Side with Weight Shift  - 1 x daily - 3 x weekly - 2 sets - 10 reps - Narrow Stance with Counter Support - EYES CLOSED  - 1 x daily - 3 x weekly - 2 sets - 20-30 seconds  hold - Sit to Stand with Arm Swing  - 1 x daily - 3 x weekly - 2 sets - 10 reps  GOALS: Goals reviewed with patient? Yes  SHORT TERM GOALS: Target date: 10/26/2023  Pt will be independent with HEP in order to demonstrate increased ability to perform tasks related to occupation/hobbies. Baseline:  Pt to be given HEP at next visit. Goal status: INITIAL  LONG TERM GOALS: Target date: 12/07/2023  1.  Patient (> 25 years old) will complete five times sit to stand test in <10 seconds indicating an increased LE strength and improved balance. Baseline: 12.69 sec Goal status: INITIAL  2.  Patient will increase ABC score to improve by 8%   to demonstrate statistically significant improvement in mobility and quality of life.  Baseline: 65.6% Goal status: INITIAL   3.  Patient will increase DGI score by > 2 points to demonstrate decreased fall risk during functional activities. Baseline: TBD at next visit; 11/07/2023= 20/24 Goal status: INITIAL   4.  Patient will reduce timed up and go to <11 seconds to reduce fall risk and demonstrate improved transfer/gait  ability. Baseline: 11.69 sec Goal status: INITIAL  5.  Patient will increase 10 meter walk test to >1.27m/s as to improve gait speed for better community ambulation and to reduce fall risk. Baseline: 0.98 m/s Goal status: INITIAL  6.  Patient will increase six minute walk test distance to >1200 for progression to community ambulator and improve gait ability Baseline: TBD at next visit. 11/07/2023= 1050 feet Goal status: REVISED  ASSESSMENT:  CLINICAL IMPRESSION:  Therapy session focused on repeat positional vestibular testing with use of vestibular goggles to prevent gaze fixation. Patient with negative both L and R Loaded Dix Hallpikes; however, upon returning to sitting after each test pt with 3+ beats R upward torsional nystagmus towards R (stronger after R [employer]) and patient with brief moment of dizziness symptoms upon returning upright from L Dix-Hallpike. Based on these results, elected to perform 2x R Canalith Repositioning Maneuvers because of the direction of the nystagmus and the stronger nystagmus response after R Dix-Hallpike. Following vestibular testing and treatment, patient experiences even greater dizziness response and mild posterior LOB after bending forward to simulate how she cares for her dog. Plan to continue with vestibular testing and treatment as described above. Pt will continue to benefit from skilled therapy to address her LE muscle weakness and impaired mobility in order to improve overall QoL and return to PLOF.      OBJECTIVE IMPAIRMENTS: Abnormal gait, decreased activity tolerance, decreased balance, decreased endurance, decreased knowledge of condition, decreased mobility, difficulty walking, decreased strength, impaired perceived functional ability, and impaired UE functional use.   ACTIVITY LIMITATIONS: carrying, lifting, squatting, reach over head, and locomotion level  PARTICIPATION LIMITATIONS: shopping, community activity, occupation, and yard  work  PERSONAL FACTORS: Age, Past/current experiences, Time since onset of injury/illness/exacerbation, and 3+ comorbidities: CKD, Anemia, clinical depression, tremor, epilepsy, DM2, Migraines, CHF, HTN are also affecting patient's functional outcome.   REHAB POTENTIAL: Good  CLINICAL DECISION MAKING: Evolving/moderate complexity  EVALUATION COMPLEXITY: Moderate  PLAN:  PT FREQUENCY: 1-2x/week  PT DURATION: 12 weeks  PLANNED INTERVENTIONS: 97750- Physical Performance Testing, 97110-Therapeutic exercises, 97530- Therapeutic activity, 97112-  Neuromuscular re-education, [phone]- Self Care, [id number]- Manual therapy, [phone]- Gait training, Vestibular training, and DME instructions  PLAN FOR NEXT SESSION:   *RECERT*  - vestibular assessment   - repeat positional testing with vestibular goggles  - perform L canalith repositioning maneuver if appropriate at that time  - ask about migraine history - Progress static balance with eyes closed and on airex pad - Progress dynamic balance activities  - Dynamic gait training  - sudden start/stops during backwards gait  - backwards stepping over obstacles - consider Mini-BEST Test at Progress Note    Shaquayla Klimas, PT, DPT, NCS, CSRS Physical Therapist - [facility]  [facility]  4:25 PM 12/05/23

## 2023-12-07 ENCOUNTER — Ambulatory Visit: Admitting: Physical Therapy

## 2023-12-07 ENCOUNTER — Other Ambulatory Visit: Payer: Self-pay | Admitting: Family Medicine

## 2023-12-08 ENCOUNTER — Telehealth: Payer: Self-pay | Admitting: Family Medicine

## 2023-12-08 NOTE — Telephone Encounter (Signed)
 Was put in front desk box to fax records as requested

## 2023-12-08 NOTE — Telephone Encounter (Signed)
 Copied from CRM [id number]. Topic: General - Other >> Dec 07, 2023  1:35 PM Larissa S wrote: Reason for CRM: Rankin with [employer] calling to check the status of med record request and order form for [employer] device/supplies faxed on 08/01. States he will refax the documents and asked that they be completed and returned.

## 2023-12-08 NOTE — Telephone Encounter (Signed)
 I do not see this.  If it was put in the box it was faxed and sent to the scan center.  I do see under media that Nat faxed something to them on 11/29/2023.  I am not sure if records were sent with this.  Please advise.

## 2023-12-11 ENCOUNTER — Other Ambulatory Visit: Payer: Self-pay | Admitting: Adult Health

## 2023-12-12 ENCOUNTER — Ambulatory Visit: Admitting: Physical Therapy

## 2023-12-12 ENCOUNTER — Ambulatory Visit

## 2023-12-12 DIAGNOSIS — R251 Tremor, unspecified: Secondary | ICD-10-CM | POA: Diagnosis not present

## 2023-12-12 DIAGNOSIS — R278 Other lack of coordination: Secondary | ICD-10-CM | POA: Diagnosis not present

## 2023-12-12 DIAGNOSIS — R262 Difficulty in walking, not elsewhere classified: Secondary | ICD-10-CM | POA: Diagnosis not present

## 2023-12-12 DIAGNOSIS — M6281 Muscle weakness (generalized): Secondary | ICD-10-CM | POA: Diagnosis not present

## 2023-12-12 DIAGNOSIS — R2689 Other abnormalities of gait and mobility: Secondary | ICD-10-CM | POA: Diagnosis not present

## 2023-12-12 NOTE — Therapy (Signed)
 OUTPATIENT OCCUPATIONAL THERAPY NEURO EVALUATION  Patient Name: Abigail Figueroa MRN: [id number] DOB:05-Aug-1960, 63 y.o., female Today's Date: 12/14/2023  PCP: Dr. Jon Eva REFERRING PROVIDER: Dr. Jon Eva  *Dr. Comer Gleason: Neurology [facility])  END OF SESSION:  OT End of Session - 12/14/23 1555     Visit Number 1    Number of Visits 12    Date for OT Re-Evaluation 03/05/24    OT Start Time 1451    OT Stop Time 1535    OT Time Calculation (min) 44 min    Activity Tolerance Patient tolerated treatment well    Behavior During Therapy [facility] for tasks assessed/performed         Past Medical History:  Diagnosis Date   Abnormal stress test    a. 10/2009: Normal myocardial perfusion imaging; b. 08/2015 MV: medium defect of mod severity in mid ant apical region w/ mild HK of the distal inf wall;  c. 08/2015 Cath: nl Cors, EF 60%.   Allergy     Not sure   Anemia    Angio-edema    Anxiety    Arthritis    Bell's palsy    CHF (congestive heart failure) (HCC)    Not sure   Chronic kidney disease 2024   Depression    Dermatitis 09/10/2014   Edema    Essential hypertension    Frequent urination    Frequent urination at night    Headache    migraines, gets botox injections   Heart palpitations 10/2011   Event monitor showing sinus tachycardia, PACs with couplets and triplets.   Hx of echocardiogram    a. 10/2009 Echo: showed normal left ventricular function, mild left ventricular hypertrophy, and no significant valve abnormalities.   Hypercholesterolemia    Migraine    Morbid obesity (HCC)    Neuromuscular disorder (HCC)    OSA (obstructive sleep apnea)    has been on continuous positive airway pressure   Seizure disorder (HCC)    Seizures (HCC)    Sleep apnea    Not sure   Type II diabetes mellitus (HCC)    Urticaria    Past Surgical History:  Procedure Laterality Date   ABDOMINAL HYSTERECTOMY  1990   without BSO   BACK SURGERY  1900s 2001   x2 with  cage put in   BILATERAL SALPINGOOPHORECTOMY  1990   CARDIAC CATHETERIZATION N/A 08/06/2015   Procedure: Left Heart Cath and Coronary Angiography;  Surgeon: Debby DELENA Sor, MD;  Location: [facility];  Service: Cardiovascular;  Laterality: N/A;   CARPAL TUNNEL RELEASE Bilateral    CHOLECYSTECTOMY  1980   COLONOSCOPY WITH PROPOFOL  N/A 08/30/2017   Procedure: COLONOSCOPY WITH PROPOFOL ;  Surgeon: Unk Corinn Skiff, MD;  Location: [facility] ENDOSCOPY;  Service: Endoscopy;  Laterality: N/A;   DG THUMB LEFT HAND  01/24/2018   repair joint in left thumb   DORSAL COMPARTMENT RELEASE Left 10/25/2014   Procedure: LEFT FIRST  DORSAL COMPARTMENT RELEASE AND RADIAL TENOSYNOVECTOMY ;  Surgeon: Elsie Mussel, MD;  Location: [facility];  Service: Orthopedics;  Laterality: Left;   HAND SURGERY     KNEE SURGERY Right 2012   meniscus tear   KNEE SURGERY  2012   LAMINECTOMY  1995   TUBAL LIGATION  1982   WRIST SURGERY Right    Patient Active Problem List   Diagnosis Date Noted   Seasonal allergies 07/25/2023   Gout involving toe of left foot 07/25/2023   Tremor 10/14/2022  Other chest pain 07/29/2022   Anemia in stage 4 chronic kidney disease (HCC) 07/19/2022   CKD (chronic kidney disease) stage 4, GFR 15-29 ml/min (HCC) 06/03/2022   Seasonal and perennial allergic rhinitis 10/11/2019   Angio-edema 08/30/2019   Obesity (BMI 30-39.9) 02/06/2019   Osteoarthrosis of hand 12/28/2017   Right-sided Bell's palsy 02/02/2016   Spondylolisthesis of lumbar region 08/25/2015   Type 2 diabetes mellitus with stage 4 chronic kidney disease, without long-term current use of insulin (HCC)    History of brain disorder 11/05/2014   Depression, major, recurrent, moderate (HCC) 11/05/2014   Generalized anxiety disorder 10/16/2014   Hypertension associated with diabetes (HCC) 10/14/2014   Epilepsy (HCC) 10/14/2014   Migraine 10/14/2014   Dysthymic disorder 10/14/2014   Dyspepsia 10/14/2014    Hematochezia 10/14/2014   Urge incontinence 10/14/2014   Alopecia 09/10/2014   Blood in feces 09/10/2014   Arthralgia of hip 09/10/2014   Snapping thumb syndrome 04/19/2014   Everitt Quervain's disease (radial styloid tenosynovitis) 03/08/2014   Hyperlipidemia associated with type 2 diabetes mellitus (HCC) 01/15/2013   Heart palpation 01/15/2013   Congestive heart failure (HCC) 11/13/2009   HYPERCALCEMIA 05/08/2007   Clinical depression 10/20/2006   Obstructive apnea 03/22/2006   ONSET DATE: Oct 2024   REFERRING DIAG: Parkinson's Disease   THERAPY DIAG:  Muscle weakness (generalized)  Other lack of coordination  Tremor of both hands  Rationale for Evaluation and Treatment: Rehabilitation  SUBJECTIVE:  SUBJECTIVE STATEMENT: Pt reports wanting to be able to manage her tremors better and do things for herself. Pt accompanied by: Alfreda Grice  PERTINENT HISTORY: Per medical record from [facility] neurology on 10/12/23: Abigail Figueroa is a 63 y.o. female PMH anemia, arthritis, anxiety, depression, BMI 33, HTN, HLD, T2DM, CKD4, CHF, gout, OSA, headaches, history of seizures, Bell's palsy, pre-syncope, BL adrenal adenomas, right optic neuropathy, presenting in consultation for evaluation of tremor.   #Parkinson's Disease Patient endorses progressive intermittent bilateral hand tremor present since 2023. On exam has re-emergent hand tremor, jaw tremor, postural instability, rigidity, masked facies consistent with early Parkinson disease. Has additional features of REM sleep behavior disorder and memory loss. Had good initial response to sinemet, will increase dose as patient complains of tremor that worsens later in the day. - Increase Sinemet 25-100mg  QID to 1 pill 8AM, 1 pill noon, 1.5 pills 5PM, 1.5 pills 9PM - After up-titrating sinemet, would start amantadine as second agent given predominant tremor - Continue PT/OT  - Continue bowel regimen   PRECAUTIONS: Fall and Other: seizures  WEIGHT  BEARING RESTRICTIONS: No  PAIN:  Are you having pain? No  FALLS: Has patient fallen in last 6 months? No  LIVING ENVIRONMENT: Lives with: lives with their family, spouse and 22 y/o grandson  Lives in: 1 level home  Stairs: Yes: External: 1 in the front , 5 steps in the back steps; can reach both on the back of the house Has following equipment at home: Single point cane and Walker - 2 wheeled  PLOF: Independent with ADLs/IADLs prior to onset of PD, but has been on disability since the 80's d/t seizures  PATIENT GOALS: I want to get my trembling under control.   OBJECTIVE:  Note: Objective measures were completed at Evaluation unless otherwise noted.  HAND DOMINANCE: Right (L side more involved with tremors)  ADLs: Overall ADLs: Spouse assists as needed Transfers/ambulation related to ADLs: indep-modified indep Eating: sometimes has to eat with 2 hands, some difficulty with utensils, sometimes spills  Grooming:  difficulty controlling electric toothbrush UB Dressing: no difficulties with clothing fasteners, including good ability to hook a bra  LB Dressing: no difficulties Toileting: indep Bathing: intermittent supv,  If she needs me she calls me, per spouse  Tub Shower transfers: supv-min A with tub/shower Equipment: none (spouse reports they are looking in to tearing out tub/shower and replacing with walk in shower)  IADLs: Shopping: daughter gets [employer] administrator, though pt attends on shorter shopping trips (wears a mask to avoid germs) Light housekeeping: able to stand and wash dishes but keeps rollator behind her in case of needing rest break Meal Prep: with supv; assist with getting food in/out of the oven d/t tremors; pt reports she requires help to dish up her own plate of food and transfer food to table. Community mobility: indep-modified indep Medication management: indep with weekly pill organizer, some difficulty with pill manipulation  Financial management:  indep with auto pay, shared task between pt and spouse Handwriting: 90% legible, shaky; noted very mild micrographia  POSTURE COMMENTS:  rounded shoulders, forward head, and anterior pelvic tilt   ACTIVITY TOLERANCE: Activity tolerance: TBD within functional contexts  UPPER EXTREMITY ROM:  BUEs WFL for ADLs  UPPER EXTREMITY MMT:  BUEs WFL for ADLs  HAND FUNCTION: Grip strength: Right: 54 lbs; Left: 40 lbs, Lateral pinch: Right: 9 lbs, Left: 9 lbs, 3 point pinch: Right: 9 lbs, Left: 8 lbs, and Tip pinch: Right NT lbs, Left: NT lbs  COORDINATION: Finger Nose Finger test: Good with mild tremor bilaterally  9 Hole Peg test: Right: 22 sec; Left: 23 sec  SENSATION: WFL  EDEMA: No visible edema  MUSCLE TONE: RUE: Within functional limits and LUE: Within functional limits  COGNITION: Overall cognitive status: Impaired; hx of memory impairment  VISION: wears glasses  OBSERVATIONS:  Pt pleasant and cooperative and eager to work towards OT goals.                                                                                                                             TREATMENT DATE: 12/12/23: Evaluation completed.  Self Care: -Education: Condition management r/t micrographia and tx strategies   AE: transfer tub bench: demonstration of transfer with bench to reduce fall risk, and advised on options to obtain  PATIENT EDUCATION: Education details: OT role, goals, poc, AE Person educated: Patient and Spouse Education method: Explanation, Demonstration, and Verbal cues Education comprehension: verbalized understanding  HOME EXERCISE PROGRAM: Not yet initiated   GOALS: Goals reviewed with patient? Yes  SHORT TERM GOALS: Target date: 01/23/24  Pt will verbalize and implement 1-2 strategies to minimize micrographia and manage tremors for improved handwriting legibility. Baseline: Eval: Initiated education, further training needed Goal status: INITIAL  2.  Pt will be indep to  perform HEP for maintaining hand strength and coordination for daily tasks.   Baseline: Eval: HEP not yet initiated  Goal status: INITIAL  LONG TERM GOALS: Target date: 03/05/24  Pt will identify  and implement 2-3 strategies to manage BUE tremors to reduce mess with self feeding. Baseline: Eval: Educ not yet provided; pt often eats with 2 hands and reports often makes a mess. Goal status: INITIAL  2.  Pt will identify and implement 1-2 strategies for transporting kitchen items/food plate from counter to table with reduced spilling risk. Baseline: Eval: Educ not yet initiated; spouse assists with item transport in kitchen Goal status: INITIAL  3.  Pt will identify and implement 1-2 strategies to enable pouring liquids with reduced risk of spilling.   Baseline: Eval: Difficulty pouring liquids; spouse assists  Goal status: INITIAL  4.  Pt will identify and implement 1-2 strategies to send a legible greeting card.   Baseline: Eval: Handwriting is shaky with reduced legibility; pt reports sending holiday cards brings her a lot of joy, but this has gotten difficult Goal status: INITIAL  ASSESSMENT:  CLINICAL IMPRESSION: Patient is a 63 y.o. female who was seen today for occupational therapy evaluation for functional decline related to Parkinson's Disease.  Pt reports BUE tremors are contributing to increased difficulty with handwriting, eating, fixing a plate of food, and transporting items around the kitchen.  Pt has a supportive spouse who assists her as needed, though pt is eager to maximize her level of independence with above noted tasks in order to reduce caregiver burden and improve QOL.  Pt will benefit from skilled OT in order to instruct in HEP, AE, activity modification/compensatory strategies, and environmental adaptations in order to address ADL/IADLs noted above.  Pt in agreement with plan.   PERFORMANCE DEFICITS: in functional skills including ADLs, IADLs, coordination, dexterity,  strength, Fine motor control, Gross motor control, mobility, balance, body mechanics, endurance, decreased knowledge of precautions, decreased knowledge of use of DME, and UE functional use, cognitive skills including memory, and psychosocial skills including coping strategies, environmental adaptation, habits, and routines and behaviors.   IMPAIRMENTS: are limiting patient from ADLs, IADLs, leisure, and social participation.   CO-MORBIDITIES: has co-morbidities such as CKD, spondylolisthesis of lumbar spine, seizure d/o, DM2 that affects occupational performance. Patient will benefit from skilled OT to address above impairments and improve overall function.  MODIFICATION OR ASSISTANCE TO COMPLETE EVALUATION: No modification of tasks or assist necessary to complete an evaluation.  OT OCCUPATIONAL PROFILE AND HISTORY: Detailed assessment: Review of records and additional review of physical, cognitive, psychosocial history related to current functional performance.  CLINICAL DECISION MAKING: Moderate - several treatment options, min-mod task modification necessary  REHAB POTENTIAL: Good  EVALUATION COMPLEXITY: Low    PLAN:  OT FREQUENCY: 1-2x/week  OT DURATION: 12 weeks  PLANNED INTERVENTIONS: 97168 OT Re-evaluation, 97535 self care/ADL training, 02889 therapeutic exercise, 97530 therapeutic activity, 97112 neuromuscular re-education, 97140 manual therapy, 97010 moist heat, 97010 cryotherapy, 97129 Cognitive training (first 15 min), 02249 Physical Performance Testing, balance training, functional mobility training, psychosocial skills training, energy conservation, coping strategies training, patient/family education, and DME and/or AE instructions  RECOMMENDED OTHER SERVICES: None at this time  CONSULTED AND AGREED WITH PLAN OF CARE: Patient and family Adult nurse (spouse, Koren)  PLAN FOR NEXT SESSION: see above poc  Inocente Blazing, MS, OTR/L   Inocente MARLA Blazing, OT 12/14/2023,  3:57 PM

## 2023-12-12 NOTE — Therapy (Incomplete)
 OUTPATIENT PHYSICAL THERAPY TREATMENT/RECERT ***   Patient Name: Abigail Figueroa MRN: [id number] DOB:1961-02-01, 63 y.o., female Today's Date: 12/12/2023   PCP: [name], MD  REFERRING PROVIDER: [name], MD   END OF SESSION: ***     Past Medical History:  Diagnosis Date   Abnormal stress test    a. 10/2009: Normal myocardial perfusion imaging; b. 08/2015 MV: medium defect of mod severity in mid ant apical region w/ mild HK of the distal inf wall;  c. 08/2015 Cath: nl Cors, EF 60%.   Allergy     Not sure   Anemia    Angio-edema    Anxiety    Arthritis    Bell's palsy    CHF (congestive heart failure) (HCC)    Not sure   Chronic kidney disease 2024   Depression    Dermatitis 09/10/2014   Edema    Essential hypertension    Frequent urination    Frequent urination at night    Headache    migraines, gets botox injections   Heart palpitations 10/2011   Event monitor showing sinus tachycardia, PACs with couplets and triplets.   Hx of echocardiogram    a. 10/2009 Echo: showed normal left ventricular function, mild left ventricular hypertrophy, and no significant valve abnormalities.   Hypercholesterolemia    Migraine    Morbid obesity (HCC)    Neuromuscular disorder (HCC)    OSA (obstructive sleep apnea)    has been on continuous positive airway pressure   Seizure disorder (HCC)    Seizures (HCC)    Sleep apnea    Not sure   Type II diabetes mellitus (HCC)    Urticaria    Past Surgical History:  Procedure Laterality Date   ABDOMINAL HYSTERECTOMY  1990   without BSO   BACK SURGERY  1900s 2001   x2 with cage put in   BILATERAL SALPINGOOPHORECTOMY  1990   CARDIAC CATHETERIZATION N/A 08/06/2015   Procedure: Left Heart Cath and Coronary Angiography;  Surgeon: Debby DELENA Sor, MD;  Location: [facility];  Service: Cardiovascular;  Laterality: N/A;   CARPAL TUNNEL RELEASE Bilateral    CHOLECYSTECTOMY  1980   COLONOSCOPY WITH PROPOFOL  N/A  08/30/2017   Procedure: COLONOSCOPY WITH PROPOFOL ;  Surgeon: Unk Corinn Skiff, MD;  Location: [facility];  Service: Endoscopy;  Laterality: N/A;   DG THUMB LEFT HAND  01/24/2018   repair joint in left thumb   DORSAL COMPARTMENT RELEASE Left 10/25/2014   Procedure: LEFT FIRST  DORSAL COMPARTMENT RELEASE AND RADIAL TENOSYNOVECTOMY ;  Surgeon: Elsie Mussel, MD;  Location: [facility];  Service: Orthopedics;  Laterality: Left;   HAND SURGERY     KNEE SURGERY Right 2012   meniscus tear   KNEE SURGERY  2012   LAMINECTOMY  1995   TUBAL LIGATION  1982   WRIST SURGERY Right    Patient Active Problem List   Diagnosis Date Noted   Seasonal allergies 07/25/2023   Gout involving toe of left foot 07/25/2023   Tremor 10/14/2022   Other chest pain 07/29/2022   Anemia in stage 4 chronic kidney disease (HCC) 07/19/2022   CKD (chronic kidney disease) stage 4, GFR 15-29 ml/min (HCC) 06/03/2022   Seasonal and perennial allergic rhinitis 10/11/2019   Angio-edema 08/30/2019   Obesity (BMI 30-39.9) 02/06/2019   Osteoarthrosis of hand 12/28/2017   Right-sided Bell's palsy 02/02/2016   Spondylolisthesis of lumbar region 08/25/2015   Type 2 diabetes mellitus with stage 4  chronic kidney disease, without long-term current use of insulin (HCC)    History of brain disorder 11/05/2014   Depression, major, recurrent, moderate (HCC) 11/05/2014   Generalized anxiety disorder 10/16/2014   Hypertension associated with diabetes (HCC) 10/14/2014   Epilepsy (HCC) 10/14/2014   Migraine 10/14/2014   Dysthymic disorder 10/14/2014   Dyspepsia 10/14/2014   Hematochezia 10/14/2014   Urge incontinence 10/14/2014   Alopecia 09/10/2014   Blood in feces 09/10/2014   Arthralgia of hip 09/10/2014   Snapping thumb syndrome 04/19/2014   Everitt Quervain's disease (radial styloid tenosynovitis) 03/08/2014   Hyperlipidemia associated with type 2 diabetes mellitus (HCC) 01/15/2013   Heart palpation 01/15/2013    Congestive heart failure (HCC) 11/13/2009   HYPERCALCEMIA 05/08/2007   Clinical depression 10/20/2006   Obstructive apnea 03/22/2006    ONSET DATE: 10/02/2022  REFERRING DIAG: G20.A2 (ICD-10-CM) - Parkinson's disease with fluctuating manifestations, unspecified whether dyskinesia present (HCC)   THERAPY DIAG: *** No diagnosis found.  Rationale for Evaluation and Treatment: Rehabilitation  SUBJECTIVE:                                                                                                                                                                                             SUBJECTIVE STATEMENT:  Only approved for 3 visits - today is 1 of 3   ***[employer] [phone] for 3 vst from 8/6 - 9/03  How did she feel after last session? Any improvement or worsening of dizziness symptoms?   Pt reports she has been doing good. Pt confirms still having dizziness when bending over to care for her dog. Denies falls, but continues to report feeling a little off balance, nothing big. Denies pain. Pt denies any changes to dizziness following last session.  ***   From Prior Sessions: 11/28/2023: Pt states she has dizziness when she bends over and then stands back up - describes it as off balance with pt stating she feels like the room is spinning. Pt states she often notices this after she bends forward to care for her dog, [name]. Pt states the symptoms last ~1 minute. Pt reports this has been happening for a few months with sudden onset. Pt with no known incident that caused the onset of these symptoms. No changes in hearing, denies nausea/vomiting. Denies symptoms going from sit>supine nor rolling over in the bed.     From EVAL: Pt was diagnosed with Parkinson's back in June of 2024.  Pt noted that she was experiencing tremors prior to that, but the formal diagnosis happened in June.  Pt is seeking therapy in order to be able to control her hands  and to improve her balance.     Pt  accompanied by: significant other, husband, Abigail Figueroa  PERTINENT HISTORY:  PMH: Hypertension associated with diabetes, Hyperlipidemia associated with type 2 diabetes mellitus, Type 2 diabetes mellitus with stage 4 chronic kidney disease, without long-term current use of insulin, CKD (chronic kidney disease) stage 4, GFR 15-29 ml/min (HCC), Obesity, anemia,  Parkinson's disease with fluctuating manifestations, unspecified whether dyskinesia present, hx of gout in L great toe and Achille's tendonitis  PAIN:  Are you having pain? Yes, but only where she received the pneumonia shot.    PRECAUTIONS: None  RED FLAGS: None   WEIGHT BEARING RESTRICTIONS: No  FALLS: Has patient fallen in last 6 months? No  LIVING ENVIRONMENT: Lives with: lives with their family Lives in: House/apartment Stairs: Yes: External: 1 on the front , 5 steps on the back steps; can reach both on the back of the house Has following equipment at home: Single point cane and Walker - 2 wheeled  PLOF: Independent  PATIENT GOALS: Pt is hoping to be able to control her hands better and to be able to ambulate without fear of losing balance.  OBJECTIVE:  Note: Objective measures were completed at Evaluation unless otherwise noted.  DIAGNOSTIC FINDINGS: N/A  COGNITION: Overall cognitive status: Within functional limits for tasks assessed   SENSATION: WFL  COORDINATION: WFL   POSTURE: rounded shoulders, forward head, and anterior pelvic tilt  LOWER EXTREMITY MMT:    MMT Right Eval Left Eval  Hip flexion 4+ 4+  Hip extension    Hip abduction 4+ 4+  Hip adduction 4+ 4+  Hip internal rotation    Hip external rotation    Knee flexion 4+ 4+  Knee extension 4+ 4+  Ankle dorsiflexion 5 5  Ankle plantarflexion    Ankle inversion    Ankle eversion    (Blank rows = not tested)  BED MOBILITY:  Not tested  TRANSFERS: Sit to stand: Complete Independence  Assistive device utilized: None     Stand to sit: Complete  Independence  Assistive device utilized: None     Chair to chair: Complete Independence  Assistive device utilized: None       RAMP:  Not tested  CURB:  Not tested  STAIRS: Findings: Level of Assistance: Complete Independence, Stair Negotiation Technique: Alternating Pattern  Forwards with Bilateral Rails, Number of Stairs: 8, Height of Stairs: 6   , and Comments: Pt performed well with using singular UE to support self at times, and bilateral on the final step descending.  GAIT: Findings: Pt with good gait mechanics during short distance walking, just slowed performance.  FUNCTIONAL TESTS:  5 times sit to stand: 12.69 sec with one instance of instability back into the chair. Timed up and go (TUG): 11.69 sec 6 minute walk test: TBD at next session 10 meter walk test: 10.15 sec/ 0.98 m/s Dynamic Gait Index: TBD at next session SLS on R LE: 9.81 sec; L LE: 17.82 sec  PATIENT SURVEYS:  ABC scale 65.6%   Vestibular Assessment on 11/28/2023  OCULOMOTOR EXAM:  Ocular Alignment: normal  Ocular ROM: No Limitations  Spontaneous Nystagmus: absent  Gaze-Induced Nystagmus: absent  Smooth Pursuits: intact  Saccades: extra eye movements in L eye compared to R, slow  Convergence/Divergence: able to converge  Cover/Cross-Cover: horizontal correction to move back to target bilaterally (in the eye that was covered)  VESTIBULAR - OCULAR REFLEX:   Slow VOR: Normal  VOR Cancellation: Normal  Head-Impulse Test: HIT Right:  negative HIT Left: positive, but very slight  Dynamic Visual Acuity: Not able to be assessed  POSITIONAL TESTING:  Loaded Right Dix-Hallpike: no nystagmus Loaded Left Dix-Hallpike: no nystagmus, but pt symptomatic only upon returning to sitting, but resolves in <30seconds Right Roll Test: no nystagmus Left Roll Test: no nystagmus *no symptoms with positional testing unless stated    Vestibular testing with goggles on 12/05/2023: LEFT Loaded Dix-Hallpike: no  nystagmus, nor symptoms in the position, but when returned to upright had brief moment of dizziness with 3+beats R upward torsional nystagmus RIGHT Loaded Dix-Hallpike: no nystagmus, nor symptoms in the position, but when returned to upright had 3+beats R upward torsional nystagmus but no symptoms Stronger/more pronounced nystagmus with return to sitting on R compared to L  R Canalith Repositioning Maneuver x2  Pt reporting brief moments of dizziness lasting <30sec-4min upon sitting upright at end of each maneuver, with symptoms less severe on 2nd treatment                                                                                                             TREATMENT DATE: 12/12/2023  ***  Unless otherwise stated, CGA was provided and gait belt donned in order to ensure pt safety throughout session.  Repeated Vestibular Positional testing using vestibular goggles LEFT Loaded Dix-Hallpike: no nystagmus, nor symptoms in the position, but when returned to upright had brief moment of dizziness with 3+beats R upward torsional nystagmus RIGHT Loaded Dix-Hallpike: no nystagmus, nor symptoms in the position, but when returned to upright had 3+beats R upward torsional nystagmus but no symptoms Stronger/more pronounced nystagmus with return to sitting on R compared to L  R Canalith Repositioning Maneuver x2  Pt reporting brief moments of dizziness lasting <30sec-78min upon sitting upright at end of each maneuver, with symptoms less severe on 2nd treatment   Stood and performed 1x simulation of bending forward to take care of her dog and upon returning to standing upright pt has mild posterior LOB requiring heavy min A to maintain upright and pt experiencing more prominent dizziness symptoms, lasting for longer duration of closer to 1 minute  Educated pt to avoid bending forward this afternoon to care for her dog and to track her symptoms over the next week. Educated pt on plan to perform repeat  vestibular positional testing using vestibular goggles at next visit and then perform canalith repositioning maneuver for L side, if appropriate at that time. Then, if this does not improve patient's symptoms consider transitioning to habituation and gaze stabilization interventions at that time.   ***   PATIENT EDUCATION: Education details: Pt educated on role of PT and services provided during current POC, along with prognosis and information about the clinic. HEP review/movement correction Person educated: Patient and Spouse Education method: Explanation Education comprehension: verbalized understanding  HOME EXERCISE PROGRAM:  Access Code: [email or web address] URL: [email or web address] Date: 11/09/2023 Prepared by: Connell Kiss  Exercises - Backward Walking with Counter Support  - 1 x daily - 3 x weekly - 2 sets - 10 reps -  Staggered Stance Weight Shift with Arms Reaching  - 1 x daily - 3 x weekly - 2 sets - 10 reps - Standing Reach to Opposite Side with Weight Shift  - 1 x daily - 3 x weekly - 2 sets - 10 reps - Narrow Stance with Counter Support - EYES CLOSED  - 1 x daily - 3 x weekly - 2 sets - 20-30 seconds  hold - Sit to Stand with Arm Swing  - 1 x daily - 3 x weekly - 2 sets - 10 reps  GOALS: Goals reviewed with patient? Yes  SHORT TERM GOALS: Target date: 10/26/2023  Pt will be independent with HEP in order to demonstrate increased ability to perform tasks related to occupation/hobbies. Baseline:  Pt to be given HEP at next visit. Goal status: INITIAL  LONG TERM GOALS: Target date: 12/07/2023  1.  Patient (> 24 years old) will complete five times sit to stand test in <10 seconds indicating an increased LE strength and improved balance. Baseline: 12.69 sec Goal status: INITIAL  2.  Patient will increase ABC score to improve by 8%   to demonstrate statistically significant improvement in mobility and quality of life.  Baseline: 65.6% Goal status: INITIAL   3.   Patient will increase DGI score by > 2 points to demonstrate decreased fall risk during functional activities. Baseline: TBD at next visit; 11/07/2023= 20/24 Goal status: INITIAL   4.  Patient will reduce timed up and go to <11 seconds to reduce fall risk and demonstrate improved transfer/gait ability. Baseline: 11.69 sec Goal status: INITIAL  5.  Patient will increase 10 meter walk test to >1.68m/s as to improve gait speed for better community ambulation and to reduce fall risk. Baseline: 0.98 m/s Goal status: INITIAL  6.  Patient will increase six minute walk test distance to >1200 for progression to community ambulator and improve gait ability Baseline: TBD at next visit. 11/07/2023= 1050 feet Goal status: REVISED  ASSESSMENT:  CLINICAL IMPRESSION:  Therapy session focused on repeat positional vestibular testing with use of vestibular goggles to prevent gaze fixation. Patient with negative both L and R Loaded Dix Hallpikes; however, upon returning to sitting after each test pt with 3+ beats R upward torsional nystagmus towards R (stronger after R [employer]) and patient with brief moment of dizziness symptoms upon returning upright from L Dix-Hallpike. Based on these results, elected to perform 2x R Canalith Repositioning Maneuvers because of the direction of the nystagmus and the stronger nystagmus response after R Dix-Hallpike. Following vestibular testing and treatment, patient experiences even greater dizziness response and mild posterior LOB after bending forward to simulate how she cares for her dog. Plan to continue with vestibular testing and treatment as described above. Pt will continue to benefit from skilled therapy to address her LE muscle weakness and impaired mobility in order to improve overall QoL and return to PLOF.      OBJECTIVE IMPAIRMENTS: Abnormal gait, decreased activity tolerance, decreased balance, decreased endurance, decreased knowledge of condition, decreased  mobility, difficulty walking, decreased strength, impaired perceived functional ability, and impaired UE functional use.   ACTIVITY LIMITATIONS: carrying, lifting, squatting, reach over head, and locomotion level  PARTICIPATION LIMITATIONS: shopping, community activity, occupation, and yard work  PERSONAL FACTORS: Age, Past/current experiences, Time since onset of injury/illness/exacerbation, and 3+ comorbidities: CKD, Anemia, clinical depression, tremor, epilepsy, DM2, Migraines, CHF, HTN are also affecting patient's functional outcome.   REHAB POTENTIAL: Good  CLINICAL DECISION MAKING: Evolving/moderate complexity  EVALUATION  COMPLEXITY: Moderate  PLAN:  PT FREQUENCY: 1-2x/week  PT DURATION: 12 weeks  PLANNED INTERVENTIONS: 97750- Physical Performance Testing, 97110-Therapeutic exercises, 97530- Therapeutic activity, 97112- Neuromuscular re-education, 97535- Self Care, [id number]- Manual therapy, [phone]- Gait training, Vestibular training, and DME instructions  PLAN FOR NEXT SESSION:   *PROGRESS NOTE*  - vestibular assessment   - repeat positional testing with vestibular goggles  - perform L canalith repositioning maneuver if appropriate at that time  - ask about migraine history - Progress static balance with eyes closed and on airex pad - Progress dynamic balance activities  - Dynamic gait training  - sudden start/stops during backwards gait  - backwards stepping over obstacles - consider Mini-BEST Test at Progress Note    Yazmina Pareja, PT, DPT, NCS, CSRS Physical Therapist - [facility]  [facility]  12:47 PM 12/12/23

## 2023-12-14 ENCOUNTER — Encounter

## 2023-12-14 ENCOUNTER — Ambulatory Visit: Payer: Medicare Other

## 2023-12-14 ENCOUNTER — Other Ambulatory Visit: Payer: Self-pay | Admitting: Physician Assistant

## 2023-12-14 DIAGNOSIS — Z1211 Encounter for screening for malignant neoplasm of colon: Secondary | ICD-10-CM

## 2023-12-14 DIAGNOSIS — Z0000 Encounter for general adult medical examination without abnormal findings: Secondary | ICD-10-CM | POA: Diagnosis not present

## 2023-12-14 DIAGNOSIS — Z1231 Encounter for screening mammogram for malignant neoplasm of breast: Secondary | ICD-10-CM

## 2023-12-14 DIAGNOSIS — N63 Unspecified lump in unspecified breast: Secondary | ICD-10-CM

## 2023-12-14 NOTE — Patient Instructions (Addendum)
 Abigail Figueroa , Thank you for taking time out of your busy schedule to complete your Annual Wellness Visit with me. I enjoyed our conversation and look forward to speaking with you again next year. I, as well as your care team,  appreciate your ongoing commitment to your health goals. Please review the following plan we discussed and let me know if I can assist you in the future.   REFERRALS SENT FOR MAMMOGRAM & COLONOSCOPY You have an order for:  []   2D Mammogram  [x]   3D Mammogram  []   Bone Density     Please call for appointment:  [facility]  [address] [place] [address] [phone] [facility] [address] [place], [place] [address] [phone] [facility] [address] [place], [place] [address] [phone]   Make sure to wear two-piece clothing.  No lotions, powders, or deodorants the day of the appointment. Make sure to bring picture ID and insurance card.  Bring list of medications you are currently taking including any supplements.   Schedule your [place] screening mammogram through MyChart!   Log into your MyChart account.  Go to 'Visit' (or 'Appointments' if on mobile App) --> Schedule an Appointment  Under 'Select a Reason for Visit' choose the Mammogram Screening option.  Complete the pre-visit questions and select the time and place that best fits your schedule.   Follow up Visits: 12/26/24 @ 3:50 PM BY PHONE We will see or speak with you next year for your Next Medicare AWV with our clinical staff Have you seen your provider in the last 6 months (3 months if uncontrolled diabetes)? Yes  Clinician Recommendations:  Aim for 30 minutes of exercise or brisk walking, 6-8 glasses of water , and 5 servings of fruits and vegetables each day. TAKE CARE!      This is a list of the screenings recommended for you:  Health  Maintenance  Topic Date Due   Zoster (Shingles) Vaccine (1 of 2) Never done   DTaP/Tdap/Td vaccine (2 - Td or Tdap) 10/19/2016   Colon Cancer Screening  08/31/2022   Hemoglobin A1C  10/07/2022   Complete foot exam   07/05/2023   Mammogram  07/08/2023   Yearly kidney function blood test for diabetes  07/13/2023   COVID-19 Vaccine (6 - Pfizer risk 2024-25 season) 09/20/2023   Yearly kidney health urinalysis for diabetes  12/14/2023   Flu Shot  12/02/2023   Eye exam for diabetics  01/17/2024   Medicare Annual Wellness Visit  12/13/2024   Pneumococcal Vaccine for high risk medical condition  Completed   Pneumococcal Vaccine for age over 72  Completed   Hepatitis C Screening  Completed   HIV Screening  Completed   Hepatitis B Vaccine  Aged Out   HPV Vaccine  Aged Out   Meningitis B Vaccine  Aged Out    Advanced directives: (ACP Link)Information on Advanced Care Planning can be found at [place]  Print production planner Health Care Directives Advance Health Care Directives. [email or web address]  Advance Care Planning is important because it:  [x]  Makes sure you receive the medical care that is consistent with your values, goals, and preferences  [x]  It provides guidance to your family and loved ones and reduces their decisional burden about whether or not they are making the right decisions based on your wishes.  Follow the link provided in  your after visit summary or read over the paperwork we have mailed to you to help you started getting your Advance Directives in place. If you need assistance in completing these, please reach out to us  so that we can help you!

## 2023-12-14 NOTE — Progress Notes (Signed)
 Subjective:   Abigail Figueroa is a 63 y.o. who presents for a Medicare Wellness preventive visit.  As a reminder, Annual Wellness Visits don't include a physical exam, and some assessments may be limited, especially if this visit is performed virtually. We may recommend an in-person follow-up visit with your provider if needed.  Visit Complete: Virtual I connected with  Abigail Figueroa on 12/14/23 by a audio enabled telemedicine application and verified that I am speaking with the correct person using two identifiers.  Patient Location: Home  Provider Location: Home Office  I discussed the limitations of evaluation and management by telemedicine. The patient expressed understanding and agreed to proceed.  Vital Signs: Because this visit was a virtual/telehealth visit, some criteria may be missing or patient reported. Any vitals not documented were not able to be obtained and vitals that have been documented are patient reported.  VideoDeclined- This patient declined Librarian, academic. Therefore the visit was completed with audio only.  Persons Participating in Visit: Patient.  AWV Questionnaire: No: Patient Medicare AWV questionnaire was not completed prior to this visit.  Cardiac Risk Factors include: advanced age (>63men, >93 women);diabetes mellitus;hypertension;dyslipidemia;sedentary lifestyle;obesity (BMI >30kg/m2)     Objective:    There were no vitals filed for this visit. There is no height or weight on file to calculate BMI.     12/14/2023    4:04 PM 09/14/2023    2:57 PM 12/06/2022   11:51 AM 09/21/2022    2:27 PM 08/11/2022    9:22 AM 08/10/2022    9:00 AM 08/05/2022    1:39 PM  Advanced Directives  Does Patient Have a Medical Advance Directive? No Yes Yes Yes Yes Yes Yes  Type of Furniture conservator/restorer;Living will Healthcare Power of [place];Living will Living will;Healthcare Power of [employer] Power of  [place];Living will Healthcare Power of [place];Living will Healthcare Power of [place];Living will  Does patient want to make changes to medical advance directive?  No - Patient declined       Copy of Healthcare Power of Attorney in Chart?    No - copy requested  No - copy requested No - copy requested  Would patient like information on creating a medical advance directive? No - Patient declined          Current Medications (verified) Outpatient Encounter Medications as of 12/14/2023  Medication Sig   amLODipine  (NORVASC ) 5 MG tablet TAKE 1.5 TABLET (7.5MG ) BY MOUTH DAILY.   ARIPiprazole (ABILIFY) 10 MG tablet SMARTSIG:1 Tablet(s) By Mouth Every Evening   Azelastine  HCl 0.15 % SOLN PLACE 2 SPRAYS INTO BOTH NOSTRILS 2 (TWO) TIMES DAILY (Patient taking differently: Place 2 sprays into both nostrils 2 (two) times daily. Taking as needed)   Azelastine -Fluticasone  (DYMISTA ) 137-50 MCG/ACT SUSP PLACE 2 SPRAYS INTO BOTH NOSTRILS IN THE MORNING AND AT BEDTIME.   BOTOX 100 UNITS SOLR injection Inject into the muscle every 3 (three) months.    busPIRone  (BUSPAR ) 15 MG tablet TAKE 1 TABLET BY MOUTH TWICE A DAY   carbamazepine  (TEGRETOL -XR) 400 MG 12 hr tablet Take 1 tablet (400 mg total) by mouth 2 (two) times daily. TAKE 1 TABLET BY MOUTH TWICE A DAY (NEED APPT PRIOR TO NEXT REFILL)TAKE 1 TABLET BY MOUTH TWICE A DAY   carbidopa-levodopa (SINEMET IR) 25-100 MG tablet Take 1 tablet by mouth Three (3) times a day. Start with 1/2 pill three times a day, after a week can increase to 1  pill three times a day.   chlorproMAZINE  (THORAZINE ) 25 MG tablet Take 50 mg by mouth 2 (two) times daily as needed.   chlorzoxazone (PARAFON) 500 MG tablet Take by mouth 4 (four) times daily as needed for muscle spasms.   clotrimazole -betamethasone  (LOTRISONE ) cream Apply 1 Application topically 2 (two) times daily.   Continuous Glucose Sensor (FREESTYLE LIBRE 3 PLUS SENSOR) MISC Change sensor every 15 days.   Continuous  Glucose Sensor (FREESTYLE LIBRE 3 PLUS SENSOR) MISC Change sensor every 15 days.Change sensor every 15 days.   EMGALITY 120 MG/ML SOAJ Inject into the skin as needed.   EPINEPHrine  0.3 mg/0.3 mL IJ SOAJ injection Inject into the muscle.   ezetimibe  (ZETIA ) 10 MG tablet TAKE 1 TABLET BY MOUTH EVERY DAY   fluocinolone (SYNALAR) 0.01 % external solution Apply topically as needed.   FLUoxetine (PROZAC) 10 MG capsule Take 40 mg by mouth daily.   fluticasone  (FLONASE ) 50 MCG/ACT nasal spray PLACE 2 SPRAYS INTO BOTH NOSTRILS IN THE MORNING AND AT BEDTIME.   furosemide  (LASIX ) 40 MG tablet Take 1 tablet (40 mg total) by mouth daily. (Patient taking differently: Take 40 mg by mouth daily. TAKES PRN)   hydroxypropyl methylcellulose / hypromellose (ISOPTO TEARS / GONIOVISC) 2.5 % ophthalmic solution Place 1 drop into both eyes 3 (three) times daily as needed for dry eyes.   lamoTRIgine  (LAMICTAL ) 100 MG tablet Take 100 mg by mouth 2 (two) times daily.   Lancets (ONETOUCH ULTRASOFT) lancets Test fasting each morning and 2 hours before supper. Retest if having hypoglycemic symptoms.   levocetirizine (XYZAL ) 5 MG tablet TAKE 1 TABLET (5 MG TOTAL) BY MOUTH 2 TIMES A WEEK   metoprolol  tartrate (LOPRESSOR ) 100 MG tablet TAKE 1 TABLET BY MOUTH TWICE A DAY   montelukast  (SINGULAIR ) 10 MG tablet TAKE 1 TABLET BY MOUTH EVERYDAY AT BEDTIME   ONETOUCH VERIO test strip TEST FASTING GLUCOSE DAILY AS DIRECTED   promethazine  (PHENERGAN ) 25 MG tablet Take 25 mg by mouth every 4 (four) hours as needed.   rosuvastatin  (CRESTOR ) 20 MG tablet TAKE 1 TABLET BY MOUTH EVERY DAY   Semaglutide ,0.25 or 0.5MG /DOS, (OZEMPIC , 0.25 OR 0.5 MG/DOSE,) 2 MG/3ML SOPN Inject 0.25 mg into the skin once a week.   tretinoin (RETIN-A) 0.025 % cream Apply topically at bedtime.   triamcinolone  ointment (KENALOG ) 0.1 % Apply 1 Application topically 2 (two) times daily.   cetirizine  (ZYRTEC ) 10 MG tablet TAKE 1 TABLET BY MOUTH EVERY DAY (Patient not  taking: Reported on 12/14/2023)   doxycycline  (VIBRA -TABS) 100 MG tablet Take 1 tablet (100 mg total) by mouth 2 (two) times daily. (Patient not taking: Reported on 12/14/2023)   No facility-administered encounter medications on file as of 12/14/2023.    Allergies (verified) Morphine, Morphine and codeine, Oxycodone , Oxycodone  hcl, Aimovig [erenumab-aooe], Augmentin  [amoxicillin -pot clavulanate], and Morphine sulfate   History: Past Medical History:  Diagnosis Date   Abnormal stress test    a. 10/2009: Normal myocardial perfusion imaging; b. 08/2015 MV: medium defect of mod severity in mid ant apical region w/ mild HK of the distal inf wall;  c. 08/2015 Cath: nl Cors, EF 60%.   Allergy     Not sure   Anemia    Angio-edema    Anxiety    Arthritis    Bell's palsy    CHF (congestive heart failure) (HCC)    Not sure   Chronic kidney disease 2024   Depression    Dermatitis 09/10/2014   Edema  Essential hypertension    Frequent urination    Frequent urination at night    Headache    migraines, gets botox injections   Heart palpitations 10/2011   Event monitor showing sinus tachycardia, PACs with couplets and triplets.   Hx of echocardiogram    a. 10/2009 Echo: showed normal left ventricular function, mild left ventricular hypertrophy, and no significant valve abnormalities.   Hypercholesterolemia    Migraine    Morbid obesity (HCC)    Neuromuscular disorder (HCC)    OSA (obstructive sleep apnea)    has been on continuous positive airway pressure   Seizure disorder (HCC)    Seizures (HCC)    Sleep apnea    Not sure   Type II diabetes mellitus (HCC)    Urticaria    Past Surgical History:  Procedure Laterality Date   ABDOMINAL HYSTERECTOMY  1990   without BSO   BACK SURGERY  1900s 2001   x2 with cage put in   BILATERAL SALPINGOOPHORECTOMY  1990   CARDIAC CATHETERIZATION N/A 08/06/2015   Procedure: Left Heart Cath and Coronary Angiography;  Surgeon: Debby DELENA Sor, MD;   Location: [facility];  Service: Cardiovascular;  Laterality: N/A;   CARPAL TUNNEL RELEASE Bilateral    CHOLECYSTECTOMY  1980   COLONOSCOPY WITH PROPOFOL  N/A 08/30/2017   Procedure: COLONOSCOPY WITH PROPOFOL ;  Surgeon: Unk Corinn Skiff, MD;  Location: [facility];  Service: Endoscopy;  Laterality: N/A;   DG THUMB LEFT HAND  01/24/2018   repair joint in left thumb   DORSAL COMPARTMENT RELEASE Left 10/25/2014   Procedure: LEFT FIRST  DORSAL COMPARTMENT RELEASE AND RADIAL TENOSYNOVECTOMY ;  Surgeon: Elsie Mussel, MD;  Location: [facility];  Service: Orthopedics;  Laterality: Left;   HAND SURGERY     KNEE SURGERY Right 2012   meniscus tear   KNEE SURGERY  2012   LAMINECTOMY  1995   TUBAL LIGATION  1982   WRIST SURGERY Right    Family History  Problem Relation Age of Onset   Sarcoidosis Mother    Seizures Mother    Heart attack Mother    Alzheimer's disease Maternal Grandmother    Dementia Maternal Grandmother    Hypertension Maternal Grandfather    Diabetes Maternal Grandfather    Breast cancer Neg Hx    Allergic rhinitis Neg Hx    Asthma Neg Hx    Eczema Neg Hx    Urticaria Neg Hx    Social History   Socioeconomic History   Marital status: Married    Spouse name: Not on file   Number of children: 2   Years of education: Not on file   Highest education level: 12th grade  Occupational History   Occupation: disabled  Tobacco Use   Smoking status: Former    Current packs/day: 0.00    Types: Cigarettes    Quit date: 01/03/1983    Years since quitting: 40.9   Smokeless tobacco: Never   Tobacco comments:    quit in 1984  Vaping Use   Vaping status: Never Used  Substance and Sexual Activity   Alcohol use: No   Drug use: No   Sexual activity: Never  Other Topics Concern   Not on file  Social History Narrative   ** Merged History Encounter ** She is a married mother of 2, grandmother 3. She tries to get exercise but is not doing any routine  program.She quit smoking over 25 years ago does not drink alcohol.  No caffiene since 03/2022.    Social Drivers of Corporate investment banker Strain: Low Risk  (12/14/2023)   Overall Financial Resource Strain (CARDIA)    Difficulty of Paying Living Expenses: Not very hard  Food Insecurity: No Food Insecurity (12/14/2023)   Hunger Vital Sign    Worried About Running Out of Food in the Last Year: Never true    Ran Out of Food in the Last Year: Never true  Transportation Needs: No Transportation Needs (12/14/2023)   PRAPARE - Administrator, Civil Service (Medical): No    Lack of Transportation (Non-Medical): No  Physical Activity: Insufficiently Active (12/14/2023)   Exercise Vital Sign    Days of Exercise per Week: 3 days    Minutes of Exercise per Session: 20 min  Stress: No Stress Concern Present (12/14/2023)   [employer] of Occupational Health - Occupational Stress Questionnaire    Feeling of Stress: Not at all  Social Connections: Socially Integrated (12/14/2023)   Social Connection and Isolation Panel    Frequency of Communication with Friends and Family: More than three times a week    Frequency of Social Gatherings with Friends and Family: Twice a week    Attends Religious Services: More than 4 times per year    Active Member of [employer] or Organizations: Yes    Attends Engineer, structural: More than 4 times per year    Marital Status: Married    Tobacco Counseling Counseling given: Not Answered Tobacco comments: quit in 1984    Clinical Intake:  Pre-visit preparation completed: Yes  Pain : No/denies pain     BMI - recorded: 32.2 Nutritional Status: BMI > 30  Obese Nutritional Risks: None Diabetes: Yes CBG done?: No Did pt. bring in CBG monitor from home?: No  Lab Results  Component Value Date   HGBA1C 6.4 (H) 04/07/2022   HGBA1C 8.5 (A) 10/27/2021   HGBA1C 9.2 (A) 09/22/2021     How often do you need to have someone help you  when you read instructions, pamphlets, or other written materials from your doctor or pharmacy?: 1 - Never  Interpreter Needed?: No  Information entered by :: JHONNIE DAS, LPN   Activities of Daily Living    12/14/2023    4:07 PM  In your present state of health, do you have any difficulty performing the following activities:  Hearing? 0  Vision? 0  Difficulty concentrating or making decisions? 1  Walking or climbing stairs? 1  Dressing or bathing? 0  Doing errands, shopping? 1  Preparing Food and eating ? Y  Using the Toilet? N  In the past six months, have you accidently leaked urine? N  Do you have problems with loss of bowel control? N  Managing your Medications? Y  Managing your Finances? Y  Housekeeping or managing your Housekeeping? Y    Patient Care Team: [name], MD as PCP - General (Family Medicine) Burnard Debby LABOR, MD (Inactive) as PCP - Cardiology (Cardiology) Oneita Na, MD as Referring Physician (Specialist) Mavis Purchase, MD as Consulting Physician (Neurosurgery) Iva Marty Saltness, MD as Consulting Physician (Allergy  and Immunology) Duke, Jon Garre, PA as Physician Assistant (Cardiology) Babara Call, MD as Consulting Physician (Oncology) Jerolyn Ozell Debby, OD (Ophthalmology)  I have updated your Care Teams any recent Medical Services you may have received from other providers in the past year.     Assessment:   This is a routine wellness examination for Abigail Figueroa.  Hearing/Vision  screen Hearing Screening - Comments:: NO AIDS Vision Screening - Comments:: WEARS GLASSES ALL DAY- MD IN [place] DR.MENDSEN- HAD APPT IN MARCH   Goals Addressed             This Visit's Progress    DIET - INCREASE WATER  INTAKE         Depression Screen     12/14/2023    4:02 PM 09/08/2023    9:35 AM 07/25/2023    9:30 AM 12/06/2022   11:47 AM 10/14/2022    8:47 AM 08/11/2022    9:23 AM 07/29/2022    9:17 AM  PHQ 2/9 Scores  PHQ - 2  Score 2 2 2  0 2 2 2   PHQ- 9 Score 2 5 6  7  6     Fall Risk     12/14/2023    4:06 PM 12/06/2022   11:42 AM 10/14/2022    8:47 AM 08/11/2022    9:23 AM 07/29/2022    9:16 AM  Fall Risk   Falls in the past year? 0 1 1 1 1   Number falls in past yr: 0 0 1 0 0  Injury with Fall? 0 0 0 0 0  Risk for fall due to : Impaired balance/gait No Fall Risks History of fall(s) History of fall(s) History of fall(s)  Follow up Falls evaluation completed;Falls prevention discussed Education provided;Falls prevention discussed Falls evaluation completed      MEDICARE RISK AT HOME:  Medicare Risk at Home Any stairs in or around the home?: Yes If so, are there any without handrails?: No Home free of loose throw rugs in walkways, pet beds, electrical cords, etc?: Yes Adequate lighting in your home to reduce risk of falls?: Yes Life alert?: No Use of a cane, walker or w/c?: No Grab bars in the bathroom?: No Shower chair or bench in shower?: No Elevated toilet seat or a handicapped toilet?: No  TIMED UP AND GO:  Was the test performed?  No  Cognitive Function: 6CIT completed        12/14/2023    4:09 PM 12/06/2022   11:52 AM 02/21/2020    3:32 PM 12/12/2018   10:25 AM 07/20/2016    1:21 PM  6CIT Screen  What Year? 0 points 0 points 0 points 0 points 0 points  What month? 0 points 0 points 0 points 0 points 0 points  What time? 0 points 0 points 0 points 0 points 0 points  Count back from 20 0 points 0 points 0 points 0 points 0 points  Months in reverse 0 points 0 points 0 points 0 points 0 points  Repeat phrase 0 points 0 points 0 points 0 points 10 points  Total Score 0 points 0 points 0 points 0 points 10 points    Immunizations Immunization History  Administered Date(s) Administered   Fluad Quad(high Dose 65+) 07/29/2022   Influenza, Mdck, Trivalent,PF 6+ MOS(egg free) 03/23/2023   Influenza,inj,Quad PF,6+ Mos 02/28/2017, 01/17/2018, 02/06/2019, 02/05/2020   Moderna Covid-19 Fall  Seasonal Vaccine 26yrs & older 03/23/2023   PFIZER(Purple Top)SARS-COV-2 Vaccination 07/20/2019, 08/10/2019, 04/14/2020   PNEUMOCOCCAL CONJUGATE-20 09/08/2023   Pfizer(Comirnaty)Fall Seasonal Vaccine 12 years and older 07/29/2022   Pneumococcal Polysaccharide-23 01/17/2018   Polio, Unspecified 08/27/1962   Respiratory Syncytial Virus Vaccine,Recomb Aduvanted(Arexvy) 04/29/2022   Tdap 10/20/2006    Screening Tests Health Maintenance  Topic Date Due   Zoster Vaccines- Shingrix (1 of 2) Never done   DTaP/Tdap/Td (2 - Td or Tdap)  10/19/2016   Colonoscopy  08/31/2022   HEMOGLOBIN A1C  10/07/2022   FOOT EXAM  07/05/2023   MAMMOGRAM  07/08/2023   Diabetic kidney evaluation - eGFR measurement  07/13/2023   COVID-19 Vaccine (6 - Pfizer risk 2024-25 season) 09/20/2023   Diabetic kidney evaluation - Urine ACR  12/14/2023   INFLUENZA VACCINE  12/02/2023   OPHTHALMOLOGY EXAM  01/17/2024   Medicare Annual Wellness (AWV)  12/13/2024   Pneumococcal Vaccine: 19-49 Years  Completed   Pneumococcal Vaccine: 50+ Years  Completed   Hepatitis C Screening  Completed   HIV Screening  Completed   Hepatitis B Vaccines  Aged Out   HPV VACCINES  Aged Out   Meningococcal B Vaccine  Aged Out    Health Maintenance  Health Maintenance Due  Topic Date Due   Zoster Vaccines- Shingrix (1 of 2) Never done   DTaP/Tdap/Td (2 - Td or Tdap) 10/19/2016   Colonoscopy  08/31/2022   HEMOGLOBIN A1C  10/07/2022   FOOT EXAM  07/05/2023   MAMMOGRAM  07/08/2023   Diabetic kidney evaluation - eGFR measurement  07/13/2023   COVID-19 Vaccine (6 - Pfizer risk 2024-25 season) 09/20/2023   Diabetic kidney evaluation - Urine ACR  12/14/2023   INFLUENZA VACCINE  12/02/2023   Health Maintenance Items Addressed: UP TO DATE ON SHOTS EXCEPT SHINGRIX & TDAP; MAMMOGRAM & COLONOSCOPY REFERRALS SENT  Additional Screening:  Vision Screening: Recommended annual ophthalmology exams for early detection of glaucoma and other  disorders of the eye. Would you like a referral to an eye doctor? No    Dental Screening: Recommended annual dental exams for proper oral hygiene  Community Resource Referral / Chronic Care Management: CRR required this visit?  No   CCM required this visit?  No   Plan:    I have personally reviewed and noted the following in the patient's chart:   Medical and social history Use of alcohol, tobacco or illicit drugs  Current medications and supplements including opioid prescriptions. Patient is not currently taking opioid prescriptions. Functional ability and status Nutritional status Physical activity Advanced directives List of other physicians Hospitalizations, surgeries, and ER visits in previous 12 months Vitals Screenings to include cognitive, depression, and falls Referrals and appointments  In addition, I have reviewed and discussed with patient certain preventive protocols, quality metrics, and best practice recommendations. A written personalized care plan for preventive services as well as general preventive health recommendations were provided to patient.   Jhonnie GORMAN Das, LPN   [id number]   After Visit Summary: (MyChart) Due to this being a telephonic visit, the after visit summary with patients personalized plan was offered to patient via MyChart   Notes: MAMMOGRAM & COLONOSCOPY REFERRALS SENT

## 2023-12-16 ENCOUNTER — Ambulatory Visit (INDEPENDENT_AMBULATORY_CARE_PROVIDER_SITE_OTHER): Admitting: Clinical

## 2023-12-16 DIAGNOSIS — F411 Generalized anxiety disorder: Secondary | ICD-10-CM

## 2023-12-16 DIAGNOSIS — F330 Major depressive disorder, recurrent, mild: Secondary | ICD-10-CM | POA: Diagnosis not present

## 2023-12-16 NOTE — Progress Notes (Signed)
 [facility] Counselor/Therapist Progress Note  Patient ID: MADYN IVINS, MRN: [id number],    Date: 12/16/2023  Time Spent: 12:33pm - 1:38pm : 65 minutes  Treatment Type: Individual Therapy  Reported Symptoms: none reported  Mental Status Exam: Appearance:  Neat and Well Groomed     Behavior: Appropriate  Motor: Normal  Speech/Language:  Clear and Coherent and Normal Rate  Affect: Appropriate  Mood: normal  Thought process: normal  Thought content:   WNL  Sensory/Perceptual disturbances:   WNL  Orientation: oriented to person, place, time/date, and situation  Attention: Good  Concentration: Good  Memory: WNL  Fund of knowledge:  Good  Insight:   Good  Judgment:  Good  Impulse Control: Good   Risk Assessment: Danger to Self:  No Patient denied current suicidal ideation  Self-injurious Behavior: No Danger to Others: No Patient denied current homicidal ideation Duty to Warn:no Physical Aggression / Violence:No  Access to Firearms a concern: No  Gang Involvement:No   Subjective: Patient stated, things are a little bit better in response to events since last session. Patient stated, I'm having some issues with some things with my brother. Patient stated, its been pretty good in response to patient's mood since last session. Patient reported patient wanted to share some good news with clinician today. Patient reported patient has observed changes in grandson's interactions with others, grandson's behaviors, and grandson has been working more hours.  Patient reported patient/husband were contacted by weatherization program contact and patient stated, it made me happy that she called us . During today's session, patient requested to discuss patient's concerns as it relates to patient's brother. Patient reported recently patient was on the phone with patient's brother, brother thought he ended the call with patient, and patient heard a conversation between  patient's brother and the individual brother had an affair with. Patient reported I was devastated in response to call with brother. Patient stated, I need to find another church to go to and stated, I can't keep on doing this in reference to attending the church brother pastors. Patient reported patient is concerned if she discloses recent phone call with brother to her husband, her husband will not provide transportation to church and it will increase the strain on relationship with brother in law. Patient stated, I don't know what to do. Patient reported when attending church patient thinks about brother's previous behaviors and the impact on brother's family. Patient stated, that has been weighing heavy on me and patient reported she has not attended church as a result. Patient stated, I'm tired of talking to him about this in reference to brother's affair. Patient reported experiencing  what if thoughts triggered by phone call with brother.   Interventions: Cognitive Behavioral Therapy. Clinician conducted session via caregility video from clinician's home office. Patient provided verbal consent to proceed with telehealth session and is aware of limitations of telephone or video visits. Patient participated in session from patient's home. Reviewed positive events since last session. Processed thoughts/feelings triggered by phone call with brother and the impact on patient's spiritual health. Provided psycho education related to accountability. Reviewed identifying evidence for/against negative thoughts/distortions. Discussed journaling patient's thoughts/feelings related to patient's brother.    Collaboration of Care: not required at this time   Diagnosis:  Mild episode of recurrent major depressive disorder (HCC)    Generalized anxiety disorder   Plan: Patient is to utilize [employer] Therapy, thought re-framing, relaxation techniques, mindfulness, effective communication, and  coping strategies to decrease symptoms  associated with Major Depressive Disorder and Generalized Anxiety Disorder. Frequency: bi-weekly  Modality: individual      Long-term goal:   Patient stated, I want to see a change in the anxiety.      Reduce overall level, frequency, and intensity of the symptoms of anxiety and depression as evidenced by decreased sadness, feelings of anxiety, feeling lost, lack of energy, difficulty falling and staying asleep, changes in concentration, psychomotor retardation, rapid heart rate, excessive worry, restlessness, feeling jittery, and feeling on edge from 5 to 6 days per week to 0 to 1 days per week per patient reported for at least 3 consecutive months.   Target Date: 09/11/24  Progress: progressing    Short-term goal:  Develop coping strategies to utilize in response to symptoms of depression/anxiety and stressors  Target Date: 09/11/24  Progress: progressing    Verbally express patient's thoughts and feelings to others and utilize effective communication strategies when expressing patient's thoughts/feelings    Target Date: 09/11/24  Progress: progressing    Verbalize an understanding of the relationship between symptoms of anxiety and the impact on patient's thought patterns and behaviors    Target Date: 09/11/24  Progress: progressing    Develop strategies for patient to utilize to support patient's daughter and grandson as it relates to grandson using illegal substances.  Target Date: 09/11/24  Progress: progressing    Develop and implement coping strategies for patient to utilize in response to grandson using illegal substances Target Date: 09/11/24  Progress: progressing       Darice Seats, LCSW

## 2023-12-16 NOTE — Progress Notes (Signed)
   Darice Seats, LCSW

## 2023-12-19 ENCOUNTER — Ambulatory Visit: Admitting: Physical Therapy

## 2023-12-19 ENCOUNTER — Ambulatory Visit: Admitting: Occupational Therapy

## 2023-12-19 DIAGNOSIS — M6281 Muscle weakness (generalized): Secondary | ICD-10-CM

## 2023-12-19 DIAGNOSIS — R262 Difficulty in walking, not elsewhere classified: Secondary | ICD-10-CM | POA: Diagnosis not present

## 2023-12-19 DIAGNOSIS — R278 Other lack of coordination: Secondary | ICD-10-CM | POA: Diagnosis not present

## 2023-12-19 DIAGNOSIS — R251 Tremor, unspecified: Secondary | ICD-10-CM | POA: Diagnosis not present

## 2023-12-19 DIAGNOSIS — R2689 Other abnormalities of gait and mobility: Secondary | ICD-10-CM | POA: Diagnosis not present

## 2023-12-19 NOTE — Therapy (Signed)
 OUTPATIENT OCCUPATIONAL THERAPY NEURO  TREATMENT NOTE  Patient Name: Abigail Figueroa MRN: [id number] DOB:[birth year], 63 y.o., female Today's Date: 12/19/2023  PCP: Dr. Jon Eva REFERRING PROVIDER: Dr. Jon Eva  *Dr. Comer Gleason: Neurology [facility])  END OF SESSION:  OT End of Session - 12/19/23 1408     Visit Number 2    Number of Visits 12    Date for OT Re-Evaluation 03/05/24    OT Start Time 1406    OT Stop Time 1445    OT Time Calculation (min) 39 min    Behavior During Therapy [facility] for tasks assessed/performed         Past Medical History:  Diagnosis Date   Abnormal stress test    a. 10/2009: Normal myocardial perfusion imaging; b. 08/2015 MV: medium defect of mod severity in mid ant apical region w/ mild HK of the distal inf wall;  c. 08/2015 Cath: nl Cors, EF 60%.   Allergy     Not sure   Anemia    Angio-edema    Anxiety    Arthritis    Bell's palsy    CHF (congestive heart failure) (HCC)    Not sure   Chronic kidney disease 2024   Depression    Dermatitis 09/10/2014   Edema    Essential hypertension    Frequent urination    Frequent urination at night    Headache    migraines, gets botox injections   Heart palpitations 10/2011   Event monitor showing sinus tachycardia, PACs with couplets and triplets.   Hx of echocardiogram    a. 10/2009 Echo: showed normal left ventricular function, mild left ventricular hypertrophy, and no significant valve abnormalities.   Hypercholesterolemia    Migraine    Morbid obesity (HCC)    Neuromuscular disorder (HCC)    OSA (obstructive sleep apnea)    has been on continuous positive airway pressure   Seizure disorder (HCC)    Seizures (HCC)    Sleep apnea    Not sure   Type II diabetes mellitus (HCC)    Urticaria    Past Surgical History:  Procedure Laterality Date   ABDOMINAL HYSTERECTOMY  1990   without BSO   BACK SURGERY  1900s 2001   x2 with cage put in   BILATERAL SALPINGOOPHORECTOMY   1990   CARDIAC CATHETERIZATION N/A 08/06/2015   Procedure: Left Heart Cath and Coronary Angiography;  Surgeon: Debby DELENA Sor, MD;  Location: [facility];  Service: Cardiovascular;  Laterality: N/A;   CARPAL TUNNEL RELEASE Bilateral    CHOLECYSTECTOMY  1980   COLONOSCOPY WITH PROPOFOL  N/A 08/30/2017   Procedure: COLONOSCOPY WITH PROPOFOL ;  Surgeon: Unk Corinn Skiff, MD;  Location: [facility] ENDOSCOPY;  Service: Endoscopy;  Laterality: N/A;   DG THUMB LEFT HAND  01/24/2018   repair joint in left thumb   DORSAL COMPARTMENT RELEASE Left 10/25/2014   Procedure: LEFT FIRST  DORSAL COMPARTMENT RELEASE AND RADIAL TENOSYNOVECTOMY ;  Surgeon: Elsie Mussel, MD;  Location: [facility];  Service: Orthopedics;  Laterality: Left;   HAND SURGERY     KNEE SURGERY Right 2012   meniscus tear   KNEE SURGERY  2012   LAMINECTOMY  1995   TUBAL LIGATION  1982   WRIST SURGERY Right    Patient Active Problem List   Diagnosis Date Noted   Seasonal allergies 07/25/2023   Gout involving toe of left foot 07/25/2023   Tremor 10/14/2022   Other chest pain 07/29/2022  Anemia in stage 4 chronic kidney disease (HCC) 07/19/2022   CKD (chronic kidney disease) stage 4, GFR 15-29 ml/min (HCC) 06/03/2022   Seasonal and perennial allergic rhinitis 10/11/2019   Angio-edema 08/30/2019   Obesity (BMI 30-39.9) 02/06/2019   Osteoarthrosis of hand 12/28/2017   Right-sided Bell's palsy 02/02/2016   Spondylolisthesis of lumbar region 08/25/2015   Type 2 diabetes mellitus with stage 4 chronic kidney disease, without long-term current use of insulin (HCC)    History of brain disorder 11/05/2014   Depression, major, recurrent, moderate (HCC) 11/05/2014   Generalized anxiety disorder 10/16/2014   Hypertension associated with diabetes (HCC) 10/14/2014   Epilepsy (HCC) 10/14/2014   Migraine 10/14/2014   Dysthymic disorder 10/14/2014   Dyspepsia 10/14/2014   Hematochezia 10/14/2014   Urge incontinence  10/14/2014   Alopecia 09/10/2014   Blood in feces 09/10/2014   Arthralgia of hip 09/10/2014   Snapping thumb syndrome 04/19/2014   Everitt Quervain's disease (radial styloid tenosynovitis) 03/08/2014   Hyperlipidemia associated with type 2 diabetes mellitus (HCC) 01/15/2013   Heart palpation 01/15/2013   Congestive heart failure (HCC) 11/13/2009   HYPERCALCEMIA 05/08/2007   Clinical depression 10/20/2006   Obstructive apnea 03/22/2006   ONSET DATE: Oct 2024   REFERRING DIAG: Parkinson's Disease   THERAPY DIAG:  Muscle weakness (generalized)  Other lack of coordination  Rationale for Evaluation and Treatment: Rehabilitation  SUBJECTIVE:  SUBJECTIVE STATEMENT: Pt. reports doing okay today Pt accompanied by: Alfreda Grice  PERTINENT HISTORY: Per medical record from [facility] neurology on 10/12/23: [name] is a 64 y.o. female PMH anemia, arthritis, anxiety, depression, BMI 33, HTN, HLD, T2DM, CKD4, CHF, gout, OSA, headaches, history of seizures, Bell's palsy, pre-syncope, BL adrenal adenomas, right optic neuropathy, presenting in consultation for evaluation of tremor.   #Parkinson's Disease Patient endorses progressive intermittent bilateral hand tremor present since 2023. On exam has re-emergent hand tremor, jaw tremor, postural instability, rigidity, masked facies consistent with early Parkinson disease. Has additional features of REM sleep behavior disorder and memory loss. Had good initial response to sinemet, will increase dose as patient complains of tremor that worsens later in the day. - Increase Sinemet 25-100mg  QID to 1 pill 8AM, 1 pill noon, 1.5 pills 5PM, 1.5 pills 9PM - After up-titrating sinemet, would start amantadine as second agent given predominant tremor - Continue PT/OT  - Continue bowel regimen   PRECAUTIONS: Fall and Other: seizures  WEIGHT BEARING RESTRICTIONS: No  PAIN:  Are you having pain? No  FALLS: Has patient fallen in last 6 months? No  LIVING  ENVIRONMENT: Lives with: lives with their family, spouse and 58 y/o grandson  Lives in: 1 level home  Stairs: Yes: External: 1 in the front , 5 steps in the back steps; can reach both on the back of the house Has following equipment at home: Single point cane and Walker - 2 wheeled  PLOF: Independent with ADLs/IADLs prior to onset of PD, but has been on disability since the 80's d/t seizures  PATIENT GOALS: I want to get my trembling under control.   OBJECTIVE:  Note: Objective measures were completed at Evaluation unless otherwise noted.  HAND DOMINANCE: Right (L side more involved with tremors)  ADLs: Overall ADLs: Spouse assists as needed Transfers/ambulation related to ADLs: indep-modified indep Eating: sometimes has to eat with 2 hands, some difficulty with utensils, sometimes spills  Grooming: difficulty controlling electric toothbrush UB Dressing: no difficulties with clothing fasteners, including good ability to hook a bra  LB Dressing: no  difficulties Toileting: indep Bathing: intermittent supv,  If she needs me she calls me, per spouse  Tub Shower transfers: supv-min A with tub/shower Equipment: none (spouse reports they are looking in to tearing out tub/shower and replacing with walk in shower)  IADLs: Shopping: daughter gets [employer], though pt attends on shorter shopping trips (wears a mask to avoid germs) Light housekeeping: able to stand and wash dishes but keeps rollator behind her in case of needing rest break Meal Prep: with supv; assist with getting food in/out of the oven d/t tremors; pt reports she requires help to dish up her own plate of food and transfer food to table. Community mobility: indep-modified indep Medication management: indep with weekly pill organizer, some difficulty with pill manipulation  Financial management: indep with auto pay, shared task between pt and spouse Handwriting: 90% legible, shaky; noted very mild  micrographia  POSTURE COMMENTS:  rounded shoulders, forward head, and anterior pelvic tilt   ACTIVITY TOLERANCE: Activity tolerance: TBD within functional contexts  UPPER EXTREMITY ROM:  BUEs WFL for ADLs  UPPER EXTREMITY MMT:  BUEs WFL for ADLs  HAND FUNCTION: Grip strength: Right: 54 lbs; Left: 40 lbs, Lateral pinch: Right: 9 lbs, Left: 9 lbs, 3 point pinch: Right: 9 lbs, Left: 8 lbs, and Tip pinch: Right NT lbs, Left: NT lbs  COORDINATION: Finger Nose Finger test: Good with mild tremor bilaterally  9 Hole Peg test: Right: 22 sec; Left: 23 sec  SENSATION: WFL  EDEMA: No visible edema  MUSCLE TONE: RUE: Within functional limits and LUE: Within functional limits  COGNITION: Overall cognitive status: Impaired; hx of memory impairment  VISION: wears glasses  OBSERVATIONS:  Pt pleasant and cooperative and eager to work towards OT goals.                                                                                                                             TREATMENT DATE: 12/19/23:  Therapeutic Activities:   -Yellow theraputty hand strengthening exercises including: gross gripping, gross digit extension, lateral, and 3pt. pinch strengthening, thumb opposition, rolling circular spheres within the tips of the fingers, and manipulating putty within the tips of fingers to remove small objects. Pt. Was provided with a visual handout HEP through [employer] with a video access code.  -Pt. required modified technique for lateral key pinch, 3pt. Pinch, and 2pt. pinch with the wrist in neutral, or slight flexion.  Self-care:  -Assessed trials of  writing legibility using a tripod pen grip and a Pen Again. Multiple trials of each were assessed with improved stability of pen grasp with each, as well as improved legibility.  -Assessed writing speed: one 15 word sentence with a standard pen in 1 min. & 17 sec. , followed by the same15 word sentence using a Pen Again completed in 1 min. & 9  sec.   -PATIENT EDUCATION: Education details: OT role, goals, poc, AE Person educated: Patient and Spouse Education method: Explanation, Demonstration,  and Verbal cues Education comprehension: verbalized understanding  HOME EXERCISE PROGRAM: Not yet initiated   GOALS: Goals reviewed with patient? Yes  SHORT TERM GOALS: Target date: 01/23/24  Pt will verbalize and implement 1-2 strategies to minimize micrographia and manage tremors for improved handwriting legibility. Baseline: Eval: Initiated education, further training needed Goal status: INITIAL  2.  Pt will be indep to perform HEP for maintaining hand strength and coordination for daily tasks.   Baseline: Eval: HEP not yet initiated  Goal status: INITIAL  LONG TERM GOALS: Target date: 03/05/24  Pt will identify and implement 2-3 strategies to manage BUE tremors to reduce mess with self feeding. Baseline: Eval: Educ not yet provided; pt often eats with 2 hands and reports often makes a mess. Goal status: INITIAL  2.  Pt will identify and implement 1-2 strategies for transporting kitchen items/food plate from counter to table with reduced spilling risk. Baseline: Eval: Educ not yet initiated; spouse assists with item transport in kitchen Goal status: INITIAL  3.  Pt will identify and implement 1-2 strategies to enable pouring liquids with reduced risk of spilling.   Baseline: Eval: Difficulty pouring liquids; spouse assists  Goal status: INITIAL  4.  Pt will identify and implement 1-2 strategies to send a legible greeting card.   Baseline: Eval: Handwriting is shaky with reduced legibility; pt reports sending holiday cards brings her a lot of joy, but this has gotten difficult Goal status: INITIAL  ASSESSMENT:  CLINICAL IMPRESSION:  Pt. presented with improved stability with the pen grasp, and improved writing legibility when using both the tripod pen grip, and the Pen Again.  Pt. Was able to demonstrate the proper  technique for the theraputty, as well as the modification for wrist position in slight flexion, or neutral during the lateral key pinch, 3pt. Pinch, and 2pt. Pinch. 2/2 discomfort in the thumb when performing them with the wrist in extension. Pt continues to benefit from skilled OT in order to instruct in HEP, AE, activity modification/compensatory strategies, and environmental adaptations in order to address ADL/IADLs noted above.  Pt in agreement with plan.   PERFORMANCE DEFICITS: in functional skills including ADLs, IADLs, coordination, dexterity, strength, Fine motor control, Gross motor control, mobility, balance, body mechanics, endurance, decreased knowledge of precautions, decreased knowledge of use of DME, and UE functional use, cognitive skills including memory, and psychosocial skills including coping strategies, environmental adaptation, habits, and routines and behaviors.   IMPAIRMENTS: are limiting patient from ADLs, IADLs, leisure, and social participation.   CO-MORBIDITIES: has co-morbidities such as CKD, spondylolisthesis of lumbar spine, seizure d/o, DM2 that affects occupational performance. Patient will benefit from skilled OT to address above impairments and improve overall function.  MODIFICATION OR ASSISTANCE TO COMPLETE EVALUATION: No modification of tasks or assist necessary to complete an evaluation.  OT OCCUPATIONAL PROFILE AND HISTORY: Detailed assessment: Review of records and additional review of physical, cognitive, psychosocial history related to current functional performance.  CLINICAL DECISION MAKING: Moderate - several treatment options, min-mod task modification necessary  REHAB POTENTIAL: Good  EVALUATION COMPLEXITY: Low    PLAN:  OT FREQUENCY: 1-2x/week  OT DURATION: 12 weeks  PLANNED INTERVENTIONS: 97168 OT Re-evaluation, 97535 self care/ADL training, 02889 therapeutic exercise, 97530 therapeutic activity, 97112 neuromuscular re-education, 97140  manual therapy, 97010 moist heat, 97010 cryotherapy, 97129 Cognitive training (first 15 min), 02249 Physical Performance Testing, balance training, functional mobility training, psychosocial skills training, energy conservation, coping strategies training, patient/family education, and DME and/or AE instructions  RECOMMENDED OTHER  SERVICES: None at this time  CONSULTED AND AGREED WITH PLAN OF CARE: Patient and family Adult nurse (spouse, Koren)  PLAN FOR NEXT SESSION: see above poc Richardson Otter, MS, OTR/L    Richardson Otter, OT 12/19/2023, 2:09 PM

## 2023-12-20 ENCOUNTER — Other Ambulatory Visit

## 2023-12-20 ENCOUNTER — Encounter

## 2023-12-21 ENCOUNTER — Encounter: Admitting: Occupational Therapy

## 2023-12-22 ENCOUNTER — Ambulatory Visit: Admitting: Physical Therapy

## 2023-12-26 ENCOUNTER — Ambulatory Visit

## 2023-12-26 DIAGNOSIS — R2689 Other abnormalities of gait and mobility: Secondary | ICD-10-CM | POA: Diagnosis not present

## 2023-12-26 DIAGNOSIS — R251 Tremor, unspecified: Secondary | ICD-10-CM | POA: Diagnosis not present

## 2023-12-26 DIAGNOSIS — R278 Other lack of coordination: Secondary | ICD-10-CM | POA: Diagnosis not present

## 2023-12-26 DIAGNOSIS — R262 Difficulty in walking, not elsewhere classified: Secondary | ICD-10-CM | POA: Diagnosis not present

## 2023-12-26 DIAGNOSIS — M6281 Muscle weakness (generalized): Secondary | ICD-10-CM

## 2023-12-26 NOTE — Therapy (Unsigned)
 OUTPATIENT OCCUPATIONAL THERAPY NEURO TREATMENT NOTE  Patient Name: Abigail Figueroa MRN: [id number] DOB:04/08/1961, 63 y.o., female Today's Date: 12/28/2023  PCP: Dr. Jon Figueroa REFERRING PROVIDER: Dr. Jon Figueroa  *Dr. Comer Figueroa: Neurology [facility])  END OF SESSION:  OT End of Session - 12/28/23 1336     Visit Number 3    Number of Visits 12    Date for OT Re-Evaluation 03/05/24    OT Start Time 1452    OT Stop Time 1530    OT Time Calculation (min) 38 min    Activity Tolerance Patient tolerated treatment well    Behavior During Therapy [facility] for tasks assessed/performed          Past Medical History:  Diagnosis Date   Abnormal stress test    a. 10/2009: Normal myocardial perfusion imaging; b. 08/2015 MV: medium defect of mod severity in mid ant apical region w/ mild HK of the distal inf wall;  c. 08/2015 Cath: nl Cors, EF 60%.   Allergy     Not sure   Anemia    Angio-edema    Anxiety    Arthritis    Bell's palsy    CHF (congestive heart failure) (HCC)    Not sure   Chronic kidney disease 2024   Depression    Dermatitis 09/10/2014   Edema    Essential hypertension    Frequent urination    Frequent urination at night    Headache    migraines, gets botox injections   Heart palpitations 10/2011   Event monitor showing sinus tachycardia, PACs with couplets and triplets.   Hx of echocardiogram    a. 10/2009 Echo: showed normal left ventricular function, mild left ventricular hypertrophy, and no significant valve abnormalities.   Hypercholesterolemia    Migraine    Morbid obesity (HCC)    Neuromuscular disorder (HCC)    OSA (obstructive sleep apnea)    has been on continuous positive airway pressure   Seizure disorder (HCC)    Seizures (HCC)    Sleep apnea    Not sure   Type II diabetes mellitus (HCC)    Urticaria    Past Surgical History:  Procedure Laterality Date   ABDOMINAL HYSTERECTOMY  1990   without BSO   BACK SURGERY  1900s 2001    x2 with cage put in   BILATERAL SALPINGOOPHORECTOMY  1990   CARDIAC CATHETERIZATION N/A 08/06/2015   Procedure: Left Heart Cath and Coronary Angiography;  Surgeon: Abigail DELENA Sor, MD;  Location: [facility];  Service: Cardiovascular;  Laterality: N/A;   CARPAL TUNNEL RELEASE Bilateral    CHOLECYSTECTOMY  1980   COLONOSCOPY WITH PROPOFOL  N/A 08/30/2017   Procedure: COLONOSCOPY WITH PROPOFOL ;  Surgeon: Unk Abigail Skiff, MD;  Location: [facility] ENDOSCOPY;  Service: Endoscopy;  Laterality: N/A;   DG THUMB LEFT HAND  01/24/2018   repair joint in left thumb   DORSAL COMPARTMENT RELEASE Left 10/25/2014   Procedure: LEFT FIRST  DORSAL COMPARTMENT RELEASE AND RADIAL TENOSYNOVECTOMY ;  Surgeon: Abigail Mussel, MD;  Location: [facility];  Service: Orthopedics;  Laterality: Left;   HAND SURGERY     KNEE SURGERY Right 2012   meniscus tear   KNEE SURGERY  2012   LAMINECTOMY  1995   TUBAL LIGATION  1982   WRIST SURGERY Right    Patient Active Problem List   Diagnosis Date Noted   Seasonal allergies 07/25/2023   Gout involving toe of left foot 07/25/2023   Tremor  10/14/2022   Other chest pain 07/29/2022   Anemia in stage 4 chronic kidney disease (HCC) 07/19/2022   CKD (chronic kidney disease) stage 4, GFR 15-29 ml/min (HCC) 06/03/2022   Seasonal and perennial allergic rhinitis 10/11/2019   Angio-edema 08/30/2019   Obesity (BMI 30-39.9) 02/06/2019   Osteoarthrosis of hand 12/28/2017   Right-sided Bell's palsy 02/02/2016   Spondylolisthesis of lumbar region 08/25/2015   Type 2 diabetes mellitus with stage 4 chronic kidney disease, without long-term current use of insulin (HCC)    History of brain disorder 11/05/2014   Depression, major, recurrent, moderate (HCC) 11/05/2014   Generalized anxiety disorder 10/16/2014   Hypertension associated with diabetes (HCC) 10/14/2014   Epilepsy (HCC) 10/14/2014   Migraine 10/14/2014   Dysthymic disorder 10/14/2014   Dyspepsia 10/14/2014    Hematochezia 10/14/2014   Urge incontinence 10/14/2014   Alopecia 09/10/2014   Blood in feces 09/10/2014   Arthralgia of hip 09/10/2014   Snapping thumb syndrome 04/19/2014   Abigail Figueroa's disease (radial styloid tenosynovitis) 03/08/2014   Hyperlipidemia associated with type 2 diabetes mellitus (HCC) 01/15/2013   Heart palpation 01/15/2013   Congestive heart failure (HCC) 11/13/2009   HYPERCALCEMIA 05/08/2007   Clinical depression 10/20/2006   Obstructive apnea 03/22/2006   ONSET DATE: Oct 2024   REFERRING DIAG: Parkinson's Disease   THERAPY DIAG:  Muscle weakness (generalized)  Other lack of coordination  Tremor of both hands  Rationale for Evaluation and Treatment: Rehabilitation  SUBJECTIVE:  SUBJECTIVE STATEMENT: Pt reports some R thumb pain when working with her theraputty at home. Pt accompanied by: Abigail Figueroa  PERTINENT HISTORY: Per medical record from [facility] neurology on 10/12/23: [name] is a 63 y.o. female PMH anemia, arthritis, anxiety, depression, BMI 33, HTN, HLD, T2DM, CKD4, CHF, gout, OSA, headaches, history of seizures, Bell's palsy, pre-syncope, BL adrenal adenomas, right optic neuropathy, presenting in consultation for evaluation of tremor.   #Parkinson's Disease Patient endorses progressive intermittent bilateral hand tremor present since 2023. On exam has re-emergent hand tremor, jaw tremor, postural instability, rigidity, masked facies consistent with early Parkinson disease. Has additional features of REM sleep behavior disorder and memory loss. Had good initial response to sinemet, will increase dose as patient complains of tremor that worsens later in the day. - Increase Sinemet 25-100mg  QID to 1 pill 8AM, 1 pill noon, 1.5 pills 5PM, 1.5 pills 9PM - After up-titrating sinemet, would start amantadine as second agent given predominant tremor - Continue PT/OT  - Continue bowel regimen   PRECAUTIONS: Fall and Other: seizures  WEIGHT BEARING  RESTRICTIONS: No  PAIN: 12/26/23: mild-moderate R thumb pain with resistive putty exercises Are you having pain? No  FALLS: Has patient fallen in last 6 months? No  LIVING ENVIRONMENT: Lives with: lives with their family, spouse and 60 y/o grandson  Lives in: 1 level home  Stairs: Yes: External: 1 in the front , 5 steps in the back steps; can reach both on the back of the house Has following equipment at home: Single point cane and Walker - 2 wheeled  PLOF: Independent with ADLs/IADLs prior to onset of PD, but has been on disability since the 80's d/t seizures  PATIENT GOALS: I want to get my trembling under control.   OBJECTIVE:  Note: Objective measures were completed at Evaluation unless otherwise noted.  HAND DOMINANCE: Right (L side more involved with tremors)  ADLs: Overall ADLs: Spouse assists as needed Transfers/ambulation related to ADLs: indep-modified indep Eating: sometimes has to eat with 2 hands,  some difficulty with utensils, sometimes spills  Grooming: difficulty controlling electric toothbrush UB Dressing: no difficulties with clothing fasteners, including good ability to hook a bra  LB Dressing: no difficulties Toileting: indep Bathing: intermittent supv,  If she needs me she calls me, per spouse  Tub Shower transfers: supv-min A with tub/shower Equipment: none (spouse reports they are looking in to tearing out tub/shower and replacing with walk in shower)  IADLs: Shopping: daughter gets [employer] administrator, though pt attends on shorter shopping trips (wears a mask to avoid germs) Light housekeeping: able to stand and wash dishes but keeps rollator behind her in case of needing rest break Meal Prep: with supv; assist with getting food in/out of the oven d/t tremors; pt reports she requires help to dish up her own plate of food and transfer food to table. Community mobility: indep-modified indep Medication management: indep with weekly pill organizer, some  difficulty with pill manipulation  Financial management: indep with auto pay, shared task between pt and spouse Handwriting: 90% legible, shaky; noted very mild micrographia  POSTURE COMMENTS:  rounded shoulders, forward head, and anterior pelvic tilt   ACTIVITY TOLERANCE: Activity tolerance: TBD within functional contexts  UPPER EXTREMITY ROM:  BUEs WFL for ADLs  UPPER EXTREMITY MMT:  BUEs WFL for ADLs  HAND FUNCTION: Grip strength: Right: 54 lbs; Left: 40 lbs, Lateral pinch: Right: 9 lbs, Left: 9 lbs, 3 point pinch: Right: 9 lbs, Left: 8 lbs, and Tip pinch: Right NT lbs, Left: NT lbs  COORDINATION: Finger Nose Finger test: Good with mild tremor bilaterally  9 Hole Peg test: Right: 22 sec; Left: 23 sec  SENSATION: WFL  EDEMA: No visible edema  MUSCLE TONE: RUE: Within functional limits and LUE: Within functional limits  COGNITION: Overall cognitive status: Impaired; hx of memory impairment  VISION: wears glasses  OBSERVATIONS:  Pt pleasant and cooperative and eager to work towards OT goals.                                                                                                                             TREATMENT DATE: 12/26/23: Self Care: -HEP review: encouraged reduce reps, less forceful pinching with putty use, perform to tolerance, or avoid exercises that cause pain. -Education provided on AE options for easing item transport/functional mobility within kitchen and around the home for transporting ADL supplies: RW with 2 tray types, rollator with seat/storage  -Practice trials with rollator to transport mug of water , plates/bowls across the room from 1 surface to another; min vc for walker positioning when parking walker next to table top. -Activity modification recommendations made: use of cups with lids/divided tupperware containers with lids for easier transport of food/drink with reduced risk of spilling -Education on benefits of dycem for stabilizing food  dishes on table top or walker, and easing ability to open jars/containers/drink bottles (will issue dycem piece clinic is restocked)  -PATIENT EDUCATION: Education details: AE/activity modification for item transport Person educated:  Patient and Spouse Education method: Explanation, Demonstration, and Verbal cues Education comprehension: verbalized understanding  HOME EXERCISE PROGRAM: Yellow theraputty  GOALS: Goals reviewed with patient? Yes  SHORT TERM GOALS: Target date: 01/23/24  Pt will verbalize and implement 1-2 strategies to minimize micrographia and manage tremors for improved handwriting legibility. Baseline: Eval: Initiated education, further training needed Goal status: INITIAL  2.  Pt will be indep to perform HEP for maintaining hand strength and coordination for daily tasks.   Baseline: Eval: HEP not yet initiated  Goal status: INITIAL  LONG TERM GOALS: Target date: 03/05/24  Pt will identify and implement 2-3 strategies to manage BUE tremors to reduce mess with self feeding. Baseline: Eval: Educ not yet provided; pt often eats with 2 hands and reports often makes a mess. Goal status: INITIAL  2.  Pt will identify and implement 1-2 strategies for transporting kitchen items/food plate from counter to table with reduced spilling risk. Baseline: Eval: Educ not yet initiated; spouse assists with item transport in kitchen Goal status: INITIAL  3.  Pt will identify and implement 1-2 strategies to enable pouring liquids with reduced risk of spilling.   Baseline: Eval: Difficulty pouring liquids; spouse assists  Goal status: INITIAL  4.  Pt will identify and implement 1-2 strategies to send a legible greeting card.   Baseline: Eval: Handwriting is shaky with reduced legibility; pt reports sending holiday cards brings her a lot of joy, but this has gotten difficult Goal status: INITIAL  ASSESSMENT:  CLINICAL IMPRESSION: HEP reviewed and modifications made d/t pt  reporting R thumb pain during putty exercises.  Pt states she has a hx of thumb pain, and putty exacerbated this, but verbalized understanding of HEP modifications as noted above.  Pt found rollator helpful to ease item transport this date for mug of water  and dishes.  Pt was receptive to all education provided for AE and activity modifications noted above, and pt was agreeable to practice with rollator in the home during meal prep/table set up.  OT introduced weighted utensils at end of session, but will plan to trial next visit to assess improvements with use of fork/spoon.  Pt continues to benefit from skilled OT in order to instruct in HEP, AE, activity modification/compensatory strategies, and environmental adaptations in order to address ADL/IADLs noted above.  Pt in agreement with plan.   PERFORMANCE DEFICITS: in functional skills including ADLs, IADLs, coordination, dexterity, strength, Fine motor control, Gross motor control, mobility, balance, body mechanics, endurance, decreased knowledge of precautions, decreased knowledge of use of DME, and UE functional use, cognitive skills including memory, and psychosocial skills including coping strategies, environmental adaptation, habits, and routines and behaviors.   IMPAIRMENTS: are limiting patient from ADLs, IADLs, leisure, and social participation.   CO-MORBIDITIES: has co-morbidities such as CKD, spondylolisthesis of lumbar spine, seizure d/o, DM2 that affects occupational performance. Patient will benefit from skilled OT to address above impairments and improve overall function.  MODIFICATION OR ASSISTANCE TO COMPLETE EVALUATION: No modification of tasks or assist necessary to complete an evaluation.  OT OCCUPATIONAL PROFILE AND HISTORY: Detailed assessment: Review of records and additional review of physical, cognitive, psychosocial history related to current functional performance.  CLINICAL DECISION MAKING: Moderate - several treatment  options, min-mod task modification necessary  REHAB POTENTIAL: Good  EVALUATION COMPLEXITY: Low    PLAN:  OT FREQUENCY: 1-2x/week  OT DURATION: 12 weeks  PLANNED INTERVENTIONS: 97168 OT Re-evaluation, 97535 self care/ADL training, 02889 therapeutic exercise, 97530 therapeutic activity, 97112 neuromuscular re-education,  97140 manual therapy, 97010 moist heat, 97010 cryotherapy, 97129 Cognitive training (first 15 min), 02249 Physical Performance Testing, balance training, functional mobility training, psychosocial skills training, energy conservation, coping strategies training, patient/family education, and DME and/or AE instructions  RECOMMENDED OTHER SERVICES: None at this time  CONSULTED AND AGREED WITH PLAN OF CARE: Patient and family Adult nurse (spouse, Koren)  PLAN FOR NEXT SESSION: see above poc  Inocente Blazing, MS, OTR/L  Inocente MARLA Blazing, OT 12/28/2023, 1:37 PM

## 2023-12-28 ENCOUNTER — Ambulatory Visit

## 2023-12-28 ENCOUNTER — Encounter

## 2024-01-01 ENCOUNTER — Other Ambulatory Visit: Payer: Self-pay | Admitting: Internal Medicine

## 2024-01-03 ENCOUNTER — Telehealth: Payer: Self-pay

## 2024-01-03 ENCOUNTER — Inpatient Hospital Stay: Attending: Oncology

## 2024-01-03 ENCOUNTER — Ambulatory Visit
Admission: RE | Admit: 2024-01-03 | Discharge: 2024-01-03 | Disposition: A | Discharge status: Home / Self Care | Source: Ambulatory Visit | Attending: Physician Assistant | Admitting: Physician Assistant

## 2024-01-03 DIAGNOSIS — D631 Anemia in chronic kidney disease: Secondary | ICD-10-CM | POA: Diagnosis not present

## 2024-01-03 DIAGNOSIS — N63 Unspecified lump in unspecified breast: Secondary | ICD-10-CM

## 2024-01-03 DIAGNOSIS — N6315 Unspecified lump in the right breast, overlapping quadrants: Secondary | ICD-10-CM | POA: Insufficient documentation

## 2024-01-03 DIAGNOSIS — N184 Chronic kidney disease, stage 4 (severe): Secondary | ICD-10-CM | POA: Insufficient documentation

## 2024-01-03 DIAGNOSIS — R92323 Mammographic fibroglandular density, bilateral breasts: Secondary | ICD-10-CM | POA: Diagnosis not present

## 2024-01-03 LAB — RETIC PANEL
Immature Retic Fract: 12.1 % (ref 2.3–15.9)
RBC.: 3.15 MIL/uL — ABNORMAL LOW (ref 3.87–5.11)
Retic Count, Absolute: 37.5 K/uL (ref 19.0–186.0)
Retic Ct Pct: 1.2 % (ref 0.4–3.1)
Reticulocyte Hemoglobin: 32.4 pg (ref >27.9)

## 2024-01-03 LAB — CBC WITH DIFFERENTIAL (CANCER CENTER ONLY)
Abs Immature Granulocytes: 0.05 K/uL (ref 0.00–0.07)
Basophils Absolute: 0.1 K/uL (ref 0.0–0.1)
Basophils Relative: 1 %
Eosinophils Absolute: 0.1 K/uL (ref 0.0–0.5)
Eosinophils Relative: 2 %
HCT: 29 % — ABNORMAL LOW (ref 36.0–46.0)
Hemoglobin: 9 g/dL — ABNORMAL LOW (ref 12.0–15.0)
Immature Granulocytes: 1 %
Lymphocytes Relative: 30 %
Lymphs Abs: 1.7 K/uL (ref 0.7–4.0)
MCH: 28.6 pg (ref 26.0–34.0)
MCHC: 31 g/dL (ref 30.0–36.0)
MCV: 92.1 fL (ref 80.0–100.0)
Monocytes Absolute: 0.4 K/uL (ref 0.1–1.0)
Monocytes Relative: 7 %
Neutro Abs: 3.4 K/uL (ref 1.7–7.7)
Neutrophils Relative %: 59 %
Platelet Count: 176 K/uL (ref 150–400)
RBC: 3.15 MIL/uL — ABNORMAL LOW (ref 3.87–5.11)
RDW: 14.9 % (ref 11.5–15.5)
WBC Count: 5.6 K/uL (ref 4.0–10.5)
nRBC: 0 % (ref 0.0–0.2)

## 2024-01-03 LAB — FERRITIN: Ferritin: 259 ng/mL (ref 11–307)

## 2024-01-03 LAB — IRON AND TIBC
Iron: 120 ug/dL (ref 28–170)
Saturation Ratios: 36 % — ABNORMAL HIGH (ref 10.4–31.8)
TIBC: 336 ug/dL (ref 250–450)
UIBC: 216 ug/dL

## 2024-01-03 NOTE — Telephone Encounter (Unsigned)
 Copied from CRM #[id number]. Topic: Clinical - Order For Equipment >> Jan 03, 2024  3:35 PM Carlatta H wrote: Reason for CRM: Abigail Figueroa needs clinical notes reflecting patient level 2 or 3 history for hyperglycemic events//Fax [phone] ph [phone] option 3

## 2024-01-04 ENCOUNTER — Ambulatory Visit

## 2024-01-05 ENCOUNTER — Encounter

## 2024-01-05 ENCOUNTER — Encounter: Payer: Self-pay | Admitting: Oncology

## 2024-01-05 NOTE — Telephone Encounter (Signed)
 We can send those if they are the last notes that we have.  We did also encourage the patient to follow-up with her endocrinologist to further work toward a CGM, though

## 2024-01-06 ENCOUNTER — Encounter: Payer: Self-pay | Admitting: Oncology

## 2024-01-06 ENCOUNTER — Inpatient Hospital Stay: Admitting: Oncology

## 2024-01-06 ENCOUNTER — Inpatient Hospital Stay

## 2024-01-06 VITALS — BP 113/72 | HR 72 | Temp 97.5°F | Resp 18 | Wt 182.3 lb

## 2024-01-06 DIAGNOSIS — D631 Anemia in chronic kidney disease: Secondary | ICD-10-CM | POA: Diagnosis not present

## 2024-01-06 DIAGNOSIS — N184 Chronic kidney disease, stage 4 (severe): Secondary | ICD-10-CM

## 2024-01-06 MED ORDER — EPOETIN ALFA-EPBX 20000 UNIT/ML IJ SOLN
20000.0000 [IU] | Freq: Once | INTRAMUSCULAR | Status: AC
  Administered 2024-01-06: 20000 [IU] via SUBCUTANEOUS
  Filled 2024-01-06: qty 1

## 2024-01-06 NOTE — Progress Notes (Signed)
 Hematology/Oncology Progress note [phone] [phone]       REFERRING PROVIDER: [name], MD  CHIEF COMPLAINTS/REASON FOR VISIT:  Anemia due to chronic kidney disease  ASSESSMENT & PLAN:   Anemia in stage 4 chronic kidney disease (HCC) Status post IV Venofer  treatments.  She tolerated well. Labs are reviewed and discussed with patient. Lab Results  Component Value Date   HGB 9.0 (L) 01/03/2024   TIBC 336 01/03/2024   IRONPCTSAT 36 (H) 01/03/2024   FERRITIN 259 01/03/2024   Ferritin >200, hold off  Venofer   She will get retacrit  20,000 units today Retacrit  20,000 units every 6 weeks  CKD (chronic kidney disease) stage 4, GFR 15-29 ml/min (HCC) avoid nephrotoxins.    Orders Placed This Encounter  Procedures   CBC with Differential (Cancer Center Only)    Standing Status:   Future    Expected Date:   06/22/2024    Expiration Date:   09/20/2024   Iron  and TIBC    Standing Status:   Future    Expected Date:   06/22/2024    Expiration Date:   09/20/2024   Ferritin    Standing Status:   Future    Expected Date:   06/22/2024    Expiration Date:   09/20/2024   Retic Panel    Standing Status:   Future    Expected Date:   06/22/2024    Expiration Date:   09/20/2024   Hemoglobin and Hematocrit (Cancer Center Only)    Standing Status:   Future    Expected Date:   05/11/2024    Expiration Date:   08/09/2024   Hemoglobin and Hematocrit (Cancer Center Only)    Standing Status:   Future    Expected Date:   03/30/2024    Expiration Date:   05/03/2024   Hemoglobin and Hematocrit (Cancer Center Only)    Standing Status:   Future    Expected Date:   02/17/2024    Expiration Date:   05/17/2024   Follow-up  H&H lab in 6w, 12w, 18w +/- retacrit   Follow up in 24 weeks labs prior to MD +/- retacrit   CBC, Iron , TIBC Ferritin retic panel    All questions were answered. The patient knows to call the clinic with any problems, questions or concerns.  Zelphia Cap, MD, PhD [facility] Hematology Oncology 01/06/2024     HISTORY OF PRESENTING ILLNESS:  AHSHA HINSLEY is a  63 y.o.  female with PMH listed below who was referred to me for anemia Reviewed patient's recent labs that was done.  Patient has progressive worsening of anemia. 07/13/2022, hemoglobin is 8.2, MCV 88.9, normal white count and platelet counts. Patient reports severe fatigue, mild shortness of breath with exertion. She denies recent chest pain on exertion,  , pre-syncopal episodes, or palpitations She had not noticed any recent bleeding such as epistaxis, hematuria.  Patient has intermittent rectal bleeding.  Constipation.  She has upcoming GI appointment for further evaluation.   Patient has chronic kidney disease, stage IV.  She follows up with nephrology.  She has had negative protein electrophoresis.  Light chain ratio was mildly increased which is nonspecific.SABRA  She is on aspirin  81 mg.   INTERVAL HISTORY CRYSTALINA STODGHILL is a 63 y.o. female who has above history reviewed by me today presents for follow up visit for anemia due to CKD.   She responded to treatments well, no need for retacrit  for 2 months.  Today she  reports some increased fatigue.    MEDICAL HISTORY:  Past Medical History:  Diagnosis Date   Abnormal stress test    a. 10/2009: Normal myocardial perfusion imaging; b. 08/2015 MV: medium defect of mod severity in mid ant apical region w/ mild HK of the distal inf wall;  c. 08/2015 Cath: nl Cors, EF 60%.   Allergy     Not sure   Anemia    Angio-edema    Anxiety    Arthritis    Bell's palsy    CHF (congestive heart failure) (HCC)    Not sure   Chronic kidney disease 2024   Depression    Dermatitis 09/10/2014   Edema    Essential hypertension    Frequent urination    Frequent urination at night    Headache    migraines, gets botox injections   Heart palpitations 10/2011   Event monitor showing sinus tachycardia, PACs with couplets and  triplets.   Hx of echocardiogram    a. 10/2009 Echo: showed normal left ventricular function, mild left ventricular hypertrophy, and no significant valve abnormalities.   Hypercholesterolemia    Migraine    Morbid obesity (HCC)    Neuromuscular disorder (HCC)    OSA (obstructive sleep apnea)    has been on continuous positive airway pressure   Seizure disorder (HCC)    Seizures (HCC)    Sleep apnea    Not sure   Type II diabetes mellitus (HCC)    Urticaria     SURGICAL HISTORY: Past Surgical History:  Procedure Laterality Date   ABDOMINAL HYSTERECTOMY  1990   without BSO   BACK SURGERY  1900s 2001   x2 with cage put in   BILATERAL SALPINGOOPHORECTOMY  1990   CARDIAC CATHETERIZATION N/A 08/06/2015   Procedure: Left Heart Cath and Coronary Angiography;  Surgeon: Debby DELENA Sor, MD;  Location: [facility];  Service: Cardiovascular;  Laterality: N/A;   CARPAL TUNNEL RELEASE Bilateral    CHOLECYSTECTOMY  1980   COLONOSCOPY WITH PROPOFOL  N/A 08/30/2017   Procedure: COLONOSCOPY WITH PROPOFOL ;  Surgeon: Unk Corinn Skiff, MD;  Location: [facility];  Service: Endoscopy;  Laterality: N/A;   DG THUMB LEFT HAND  01/24/2018   repair joint in left thumb   DORSAL COMPARTMENT RELEASE Left 10/25/2014   Procedure: LEFT FIRST  DORSAL COMPARTMENT RELEASE AND RADIAL TENOSYNOVECTOMY ;  Surgeon: Elsie Mussel, MD;  Location: [facility];  Service: Orthopedics;  Laterality: Left;   HAND SURGERY     KNEE SURGERY Right 2012   meniscus tear   KNEE SURGERY  2012   LAMINECTOMY  1995   TUBAL LIGATION  1982   WRIST SURGERY Right     SOCIAL HISTORY: Social History   Socioeconomic History   Marital status: Married    Spouse name: Not on file   Number of children: 2   Years of education: Not on file   Highest education level: 12th grade  Occupational History   Occupation: disabled  Tobacco Use   Smoking status: Former    Current packs/day: 0.00    Types: Cigarettes     Quit date: 01/03/1983    Years since quitting: 41.0   Smokeless tobacco: Never   Tobacco comments:    quit in 1984  Vaping Use   Vaping status: Never Used  Substance and Sexual Activity   Alcohol use: No   Drug use: No   Sexual activity: Never  Other Topics Concern   Not on  file  Social History Narrative   ** Merged History Encounter ** She is a married mother of 2, grandmother 3. She tries to get exercise but is not doing any routine program.She quit smoking over 25 years ago does not drink alcohol.   No caffiene since 03/2022.    Social Drivers of Corporate investment banker Strain: Low Risk  (12/14/2023)   Overall Financial Resource Strain (CARDIA)    Difficulty of Paying Living Expenses: Not very hard  Food Insecurity: No Food Insecurity (12/14/2023)   Hunger Vital Sign    Worried About Running Out of Food in the Last Year: Never true    Ran Out of Food in the Last Year: Never true  Transportation Needs: No Transportation Needs (12/14/2023)   PRAPARE - Administrator, Civil Service (Medical): No    Lack of Transportation (Non-Medical): No  Physical Activity: Insufficiently Active (12/14/2023)   Exercise Vital Sign    Days of Exercise per Week: 3 days    Minutes of Exercise per Session: 20 min  Stress: No Stress Concern Present (12/14/2023)   [employer] of Occupational Health - Occupational Stress Questionnaire    Feeling of Stress: Not at all  Social Connections: Socially Integrated (12/14/2023)   Social Connection and Isolation Panel    Frequency of Communication with Friends and Family: More than three times a week    Frequency of Social Gatherings with Friends and Family: Twice a week    Attends Religious Services: More than 4 times per year    Active Member of [employer] or Organizations: Yes    Attends Engineer, structural: More than 4 times per year    Marital Status: Married  Catering manager Violence: Not At Risk (12/14/2023)   Humiliation,  Afraid, Rape, and Kick questionnaire    Fear of Current or Ex-Partner: No    Emotionally Abused: No    Physically Abused: No    Sexually Abused: No    FAMILY HISTORY: Family History  Problem Relation Age of Onset   Sarcoidosis Mother    Seizures Mother    Heart attack Mother    Alzheimer's disease Maternal Grandmother    Dementia Maternal Grandmother    Hypertension Maternal Grandfather    Diabetes Maternal Grandfather    Breast cancer Neg Hx    Allergic rhinitis Neg Hx    Asthma Neg Hx    Eczema Neg Hx    Urticaria Neg Hx     ALLERGIES:  is allergic to morphine, morphine and codeine, oxycodone , oxycodone  hcl, aimovig [erenumab-aooe], augmentin  [amoxicillin -pot clavulanate], and morphine sulfate.  MEDICATIONS:  Current Outpatient Medications  Medication Sig Dispense Refill   amLODipine  (NORVASC ) 5 MG tablet TAKE 1.5 TABLET (7.5MG ) BY MOUTH DAILY. 135 tablet 3   ARIPiprazole (ABILIFY) 10 MG tablet SMARTSIG:1 Tablet(s) By Mouth Every Evening     Azelastine  HCl 0.15 % SOLN PLACE 2 SPRAYS INTO BOTH NOSTRILS 2 (TWO) TIMES DAILY 30 mL 11   Azelastine -Fluticasone  (DYMISTA ) 137-50 MCG/ACT SUSP PLACE 2 SPRAYS INTO BOTH NOSTRILS IN THE MORNING AND AT BEDTIME. 23 g 5   BOTOX 100 UNITS SOLR injection Inject into the muscle every 3 (three) months.      busPIRone  (BUSPAR ) 15 MG tablet TAKE 1 TABLET BY MOUTH TWICE A DAY 180 tablet 1   carbamazepine  (TEGRETOL -XR) 400 MG 12 hr tablet Take 1 tablet (400 mg total) by mouth 2 (two) times daily. TAKE 1 TABLET BY MOUTH TWICE A DAY (NEED APPT PRIOR  TO NEXT REFILL)TAKE 1 TABLET BY MOUTH TWICE A DAY 60 tablet 1   carbidopa-levodopa (SINEMET IR) 25-100 MG tablet Take 1 tablet by mouth Three (3) times a day. Start with 1/2 pill three times a day, after a week can increase to 1 pill three times a day.     chlorproMAZINE  (THORAZINE ) 25 MG tablet Take 50 mg by mouth 2 (two) times daily as needed.     chlorzoxazone (PARAFON) 500 MG tablet Take by mouth 4  (four) times daily as needed for muscle spasms.     clotrimazole -betamethasone  (LOTRISONE ) cream Apply 1 Application topically 2 (two) times daily. 45 g 3   Continuous Glucose Sensor (FREESTYLE LIBRE 3 PLUS SENSOR) MISC Change sensor every 15 days. 2 each 0   Continuous Glucose Sensor (FREESTYLE LIBRE 3 PLUS SENSOR) MISC Change sensor every 15 days.Change sensor every 15 days. 6 each 1   EMGALITY 120 MG/ML SOAJ Inject into the skin as needed.     EPINEPHrine  0.3 mg/0.3 mL IJ SOAJ injection Inject into the muscle.     ezetimibe  (ZETIA ) 10 MG tablet TAKE 1 TABLET BY MOUTH EVERY DAY 90 tablet 1   fluocinolone (SYNALAR) 0.01 % external solution Apply topically as needed.     FLUoxetine (PROZAC) 10 MG capsule Take 40 mg by mouth daily.     fluticasone  (FLONASE ) 50 MCG/ACT nasal spray PLACE 2 SPRAYS INTO BOTH NOSTRILS IN THE MORNING AND AT BEDTIME. 48 mL 1   furosemide  (LASIX ) 40 MG tablet TAKE 1 TABLET BY MOUTH EVERY DAY 90 tablet 0   hydroxypropyl methylcellulose / hypromellose (ISOPTO TEARS / GONIOVISC) 2.5 % ophthalmic solution Place 1 drop into both eyes 3 (three) times daily as needed for dry eyes. 15 mL 12   lamoTRIgine  (LAMICTAL ) 100 MG tablet Take 100 mg by mouth 2 (two) times daily.     Lancets (ONETOUCH ULTRASOFT) lancets Test fasting each morning and 2 hours before supper. Retest if having hypoglycemic symptoms. 100 each 12   levocetirizine (XYZAL ) 5 MG tablet TAKE 1 TABLET (5 MG TOTAL) BY MOUTH 2 TIMES A WEEK 24 tablet 1   metoprolol  tartrate (LOPRESSOR ) 100 MG tablet TAKE 1 TABLET BY MOUTH TWICE A DAY 180 tablet 1   montelukast  (SINGULAIR ) 10 MG tablet TAKE 1 TABLET BY MOUTH EVERYDAY AT BEDTIME 90 tablet 2   ONETOUCH VERIO test strip TEST FASTING GLUCOSE DAILY AS DIRECTED 100 strip 3   promethazine  (PHENERGAN ) 25 MG tablet Take 25 mg by mouth every 4 (four) hours as needed.     rosuvastatin  (CRESTOR ) 20 MG tablet TAKE 1 TABLET BY MOUTH EVERY DAY 90 tablet 1   Semaglutide ,0.25 or  0.5MG /DOS, (OZEMPIC , 0.25 OR 0.5 MG/DOSE,) 2 MG/3ML SOPN Inject 0.25 mg into the skin once a week. 3 mL 0   tretinoin (RETIN-A) 0.025 % cream Apply topically at bedtime.     triamcinolone  ointment (KENALOG ) 0.1 % Apply 1 Application topically 2 (two) times daily.     cetirizine  (ZYRTEC ) 10 MG tablet TAKE 1 TABLET BY MOUTH EVERY DAY (Patient not taking: Reported on 01/06/2024) 90 tablet 1   doxycycline  (VIBRA -TABS) 100 MG tablet Take 1 tablet (100 mg total) by mouth 2 (two) times daily. (Patient not taking: Reported on 01/06/2024) 10 tablet 0   No current facility-administered medications for this visit.    Review of Systems  Constitutional:  Positive for fatigue. Negative for appetite change, chills and fever.  HENT:   Negative for hearing loss and voice change.  Eyes:  Negative for eye problems.  Respiratory:  Negative for chest tightness and cough.   Cardiovascular:  Negative for chest pain.  Gastrointestinal:  Negative for abdominal distention, abdominal pain and blood in stool.  Endocrine: Negative for hot flashes.  Genitourinary:  Negative for difficulty urinating and frequency.   Musculoskeletal:  Negative for arthralgias.  Skin:  Negative for itching and rash.  Neurological:  Negative for extremity weakness.  Hematological:  Negative for adenopathy.  Psychiatric/Behavioral:  Negative for confusion.     PHYSICAL EXAMINATION: ECOG PERFORMANCE STATUS: 1 - Symptomatic but completely ambulatory Vitals:   01/06/24 1007  BP: 113/72  Pulse: 72  Resp: 18  Temp: (!) 97.5 F (36.4 C)  SpO2: 100%   Filed Weights   01/06/24 1007  Weight: 182 lb 4.8 oz (82.7 kg)    Physical Exam Constitutional:      General: She is not in acute distress. HENT:     Head: Normocephalic and atraumatic.  Eyes:     General: No scleral icterus. Cardiovascular:     Rate and Rhythm: Normal rate and regular rhythm.     Heart sounds: Normal heart sounds.  Pulmonary:     Effort: Pulmonary effort is  normal. No respiratory distress.     Breath sounds: No wheezing.  Abdominal:     General: Bowel sounds are normal. There is no distension.     Palpations: Abdomen is soft.  Musculoskeletal:        General: No deformity. Normal range of motion.     Cervical back: Normal range of motion and neck supple.  Skin:    General: Skin is warm and dry.     Findings: No erythema or rash.  Neurological:     Mental Status: She is alert and oriented to person, place, and time. Mental status is at baseline.     Cranial Nerves: No cranial nerve deficit.     Coordination: Coordination normal.  Psychiatric:        Mood and Affect: Mood normal.      LABORATORY DATA:  I have reviewed the data as listed    Latest Ref Rng & Units 01/03/2024   11:21 AM 11/25/2023   11:17 AM 10/28/2023   11:29 AM  CBC  WBC 4.0 - 10.5 K/uL 5.6     Hemoglobin 12.0 - 15.0 g/dL 9.0  89.9  89.6   Hematocrit 36.0 - 46.0 % 29.0  31.0  32.4   Platelets 150 - 400 K/uL 176         Latest Ref Rng & Units 09/08/2023   10:17 AM 07/13/2022   12:29 AM 06/15/2022    2:38 PM  CMP  Glucose 70 - 99 mg/dL  851  877   BUN 8 - 23 mg/dL  40  27   Creatinine 9.55 - 1.00 mg/dL  7.75  7.59   Sodium 864 - 145 mmol/L  139  143   Potassium 3.5 - 5.1 mmol/L  3.5  4.4   Chloride 98 - 111 mmol/L  104  105   CO2 22 - 32 mmol/L  26  22   Calcium  8.9 - 10.3 mg/dL  9.0  89.5   Total Protein 6.0 - 8.5 g/dL 7.1  7.3  7.5   Total Bilirubin 0.0 - 1.2 mg/dL <9.7  0.4  <9.7   Alkaline Phos 44 - 121 IU/L 105  86  92   AST 0 - 40 IU/L 25  218  29  ALT 0 - 32 IU/L 14  80  34       Component Value Date/Time   IRON  120 01/03/2024 1121   TIBC 336 01/03/2024 1121   FERRITIN 259 01/03/2024 1121   IRONPCTSAT 36 (H) 01/03/2024 1121     RADIOGRAPHIC STUDIES: I have personally reviewed the radiological images as listed and agreed with the findings in the report. MM 3D DIAGNOSTIC MAMMOGRAM BILATERAL BREAST Result Date: 01/03/2024 CLINICAL DATA:  History  of favored sonographically identified fat necrosis evaluated in 2023. Patient presents for delayed follow-up. EXAM: DIGITAL DIAGNOSTIC BILATERAL MAMMOGRAM WITH TOMOSYNTHESIS AND CAD; ULTRASOUND RIGHT BREAST LIMITED TECHNIQUE: Bilateral digital diagnostic mammography and breast tomosynthesis was performed. The images were evaluated with computer-aided detection. ; Targeted ultrasound examination of the right breast was performed COMPARISON:  Previous exam(s). ACR Breast Density Category b: There are scattered areas of fibroglandular density. FINDINGS: Previously described RIGHT breast focal asymmetry is no longer discretely visualized mammographically. No suspicious mass, distortion, or microcalcifications are identified to suggest presence of malignancy bilaterally. Targeted ultrasound was performed the RIGHT breast. Previously described echogenic area in the RIGHT lower breast is no longer visualized sonographically. No suspicious cystic or solid mass is seen. IMPRESSION: 1. No mammographic or sonographic evidence of malignancy at the site of mammographic concern consistent with resolution of benign fat necrosis. 2. No mammographic evidence of malignancy bilaterally. RECOMMENDATION: Screening mammogram in one year.(Code:SM-B-01Y) I have discussed the findings and recommendations with the patient. If applicable, a reminder letter will be sent to the patient regarding the next appointment. BI-RADS CATEGORY  2: Benign. Electronically Signed   By: Corean Salter M.D.   On: 01/03/2024 10:59   US  LIMITED ULTRASOUND INCLUDING AXILLA RIGHT BREAST Result Date: 01/03/2024 CLINICAL DATA:  History of favored sonographically identified fat necrosis evaluated in 2023. Patient presents for delayed follow-up. EXAM: DIGITAL DIAGNOSTIC BILATERAL MAMMOGRAM WITH TOMOSYNTHESIS AND CAD; ULTRASOUND RIGHT BREAST LIMITED TECHNIQUE: Bilateral digital diagnostic mammography and breast tomosynthesis was performed. The images were  evaluated with computer-aided detection. ; Targeted ultrasound examination of the right breast was performed COMPARISON:  Previous exam(s). ACR Breast Density Category b: There are scattered areas of fibroglandular density. FINDINGS: Previously described RIGHT breast focal asymmetry is no longer discretely visualized mammographically. No suspicious mass, distortion, or microcalcifications are identified to suggest presence of malignancy bilaterally. Targeted ultrasound was performed the RIGHT breast. Previously described echogenic area in the RIGHT lower breast is no longer visualized sonographically. No suspicious cystic or solid mass is seen. IMPRESSION: 1. No mammographic or sonographic evidence of malignancy at the site of mammographic concern consistent with resolution of benign fat necrosis. 2. No mammographic evidence of malignancy bilaterally. RECOMMENDATION: Screening mammogram in one year.(Code:SM-B-01Y) I have discussed the findings and recommendations with the patient. If applicable, a reminder letter will be sent to the patient regarding the next appointment. BI-RADS CATEGORY  2: Benign. Electronically Signed   By: Corean Salter M.D.   On: 01/03/2024 10:59

## 2024-01-06 NOTE — Assessment & Plan Note (Addendum)
 Status post IV Venofer  treatments.  She tolerated well. Labs are reviewed and discussed with patient. Lab Results  Component Value Date   HGB 9.0 (L) 01/03/2024   TIBC 336 01/03/2024   IRONPCTSAT 36 (H) 01/03/2024   FERRITIN 259 01/03/2024   Ferritin >200, hold off  Venofer   She will get retacrit  20,000 units today Retacrit  20,000 units every 6 weeks

## 2024-01-06 NOTE — Assessment & Plan Note (Signed)
-  avoid nephrotoxins

## 2024-01-09 ENCOUNTER — Ambulatory Visit (INDEPENDENT_AMBULATORY_CARE_PROVIDER_SITE_OTHER): Admitting: Clinical

## 2024-01-09 DIAGNOSIS — F411 Generalized anxiety disorder: Secondary | ICD-10-CM | POA: Diagnosis not present

## 2024-01-09 DIAGNOSIS — F330 Major depressive disorder, recurrent, mild: Secondary | ICD-10-CM

## 2024-01-09 NOTE — Progress Notes (Signed)
 [facility] Counselor/Therapist Progress Note  Patient ID: Abigail Figueroa, MRN: [id number],    Date: 01/09/2024  Time Spent: 11:37am - 12:24pm : 47 minutes   Treatment Type: Individual Therapy  Reported Symptoms: frustration  Mental Status Exam: Appearance:  Neat and Well Groomed     Behavior: Appropriate  Motor: Normal  Speech/Language:  Clear and Coherent and Normal Rate  Affect: Appropriate  Mood: Patient stated, Its not all that good in response to current mood  Thought process: normal  Thought content:   WNL  Sensory/Perceptual disturbances:   WNL  Orientation: oriented to person, place, time/date, and situation  Attention: Good  Concentration: Good  Memory: WNL  Fund of knowledge:  Good  Insight:   Good  Judgment:  Good  Impulse Control: Good   Risk Assessment: Danger to Self:  No Patient denied current suicidal ideation  Self-injurious Behavior: No Danger to Others: No Patient denied current homicidal ideation Duty to Warn:no Physical Aggression / Violence:No  Access to Firearms a concern: No  Gang Involvement:No   Subjective: Patient stated, they've been going, they haven't been the best as far as me and my brother goes in response to events since last session.  Patient stated, its just been real hard for me, its just been rough dealing with it, knowing what he's doing is so wrong in reference to brother's recent behaviors. Patient stated, I feel like whatever he says is likely a lie. Patient reported patient questions brother's behaviors and the motivation for brother's behaviors. Patient stated, I think about this a lot and stated, how can Abigail Figueroa (brother) judge anyone in reference to brother's behaviors. Patient reported I think about his dad, his marriage and the kids are involved. Patient acknowledged brother's behaviors are out of patient's control. Patient stated, even though they don't know, I hurt for them, I feel they don't  deserve this in reference to brother's family. Patient stated, maybe its time for me to bring it to light, this is how angry, this is how hurt I am. Patient stated, they (brother's family) would be hurt, there would be no benefit except the truth being told and them knowing the truth. Patient stated, its his lie he needs to tell the truth about. Patient stated, it would be off my chest (patient's conscious). Patient stated, everything would be messed up in response to costs associated with telling family members of brother's behaviors. Patient stated, it wouldn't change anything in reference to changes associated with patient disclosing brother's behaviors. Patient reported patient is upset that brother lied to patient about brother's location. Patient stated, its affecting me mentally.   Interventions: Cognitive Behavioral Therapy and Interpersonal. Clinician conducted session via caregility video and telephone audio from clinician's home office due to caregility audio not working. Caregility video was not visible due to patient utilizing telephone audio.  Patient provided verbal consent to proceed with telehealth session and is aware of limitations of telephone or video visits. Patient participated in session from patient's home. Reviewed events since last session and assessed for changes. Processed patient's thoughts and feelings associated with recent phone call with brother and brother's behaviors. Explored and identified thoughts triggered by brother's behaviors. Explored impact of family dynamics on relationship with brother. Assisted patient in exploring and identifying costs/benefits of disclosing brother's behaviors to brother's family. Clinician requested for homework patient develop a list of aspects patient can change/can not change as it relates to brother's behaviors.   Collaboration of Care: not required at this  time   Diagnosis:  Mild episode of recurrent major depressive  disorder (HCC)    Generalized anxiety disorder   Plan: Patient is to utilize [employer] Therapy, thought re-framing, relaxation techniques, mindfulness, effective communication, and coping strategies to decrease symptoms associated with Major Depressive Disorder and Generalized Anxiety Disorder. Frequency: bi-weekly  Modality: individual      Long-term goal:   Patient stated, I want to see a change in the anxiety.      Reduce overall level, frequency, and intensity of the symptoms of anxiety and depression as evidenced by decreased sadness, feelings of anxiety, feeling lost, lack of energy, difficulty falling and staying asleep, changes in concentration, psychomotor retardation, rapid heart rate, excessive worry, restlessness, feeling jittery, and feeling on edge from 5 to 6 days per week to 0 to 1 days per week per patient reported for at least 3 consecutive months.   Target Date: 09/11/24  Progress: progressing    Short-term goal:  Develop coping strategies to utilize in response to symptoms of depression/anxiety and stressors  Target Date: 09/11/24  Progress: progressing    Verbally express patient's thoughts and feelings to others and utilize effective communication strategies when expressing patient's thoughts/feelings    Target Date: 09/11/24  Progress: progressing    Verbalize an understanding of the relationship between symptoms of anxiety and the impact on patient's thought patterns and behaviors    Target Date: 09/11/24  Progress: progressing    Develop strategies for patient to utilize to support patient's daughter and grandson as it relates to grandson using illegal substances.  Target Date: 09/11/24  Progress: progressing    Develop and implement coping  Abigail Seats, LCSW

## 2024-01-09 NOTE — Progress Notes (Signed)
   Darice Seats, LCSW

## 2024-01-10 ENCOUNTER — Ambulatory Visit: Attending: Family Medicine | Admitting: Occupational Therapy

## 2024-01-10 ENCOUNTER — Ambulatory Visit: Admitting: Physical Therapy

## 2024-01-10 DIAGNOSIS — M6281 Muscle weakness (generalized): Secondary | ICD-10-CM | POA: Diagnosis not present

## 2024-01-11 NOTE — Therapy (Signed)
 OUTPATIENT OCCUPATIONAL THERAPY NEURO TREATMENT/DISCHARGE NOTE  Patient Name: Abigail Figueroa MRN: [id number] DOB:11-24-1960, 63 y.o., female Today's Date: 01/11/2024  PCP: Dr. Jon Eva REFERRING PROVIDER: Dr. Jon Eva  *Dr. Comer Gleason: Neurology [facility])  END OF SESSION:  OT End of Session - 01/11/24 0915     Visit Number 4    Number of Visits 12    Date for OT Re-Evaluation 03/05/24    OT Start Time 1400    OT Stop Time 1445    OT Time Calculation (min) 45 min    Activity Tolerance Patient tolerated treatment well    Behavior During Therapy [facility] for tasks assessed/performed          Past Medical History:  Diagnosis Date   Abnormal stress test    a. 10/2009: Normal myocardial perfusion imaging; b. 08/2015 MV: medium defect of mod severity in mid ant apical region w/ mild HK of the distal inf wall;  c. 08/2015 Cath: nl Cors, EF 60%.   Allergy     Not sure   Anemia    Angio-edema    Anxiety    Arthritis    Bell's palsy    CHF (congestive heart failure) (HCC)    Not sure   Chronic kidney disease 2024   Depression    Dermatitis 09/10/2014   Edema    Essential hypertension    Frequent urination    Frequent urination at night    Headache    migraines, gets botox injections   Heart palpitations 10/2011   Event monitor showing sinus tachycardia, PACs with couplets and triplets.   Hx of echocardiogram    a. 10/2009 Echo: showed normal left ventricular function, mild left ventricular hypertrophy, and no significant valve abnormalities.   Hypercholesterolemia    Migraine    Morbid obesity (HCC)    Neuromuscular disorder (HCC)    OSA (obstructive sleep apnea)    has been on continuous positive airway pressure   Seizure disorder (HCC)    Seizures (HCC)    Sleep apnea    Not sure   Type II diabetes mellitus (HCC)    Urticaria    Past Surgical History:  Procedure Laterality Date   ABDOMINAL HYSTERECTOMY  1990   without BSO   BACK SURGERY  1900s  2001   x2 with cage put in   BILATERAL SALPINGOOPHORECTOMY  1990   CARDIAC CATHETERIZATION N/A 08/06/2015   Procedure: Left Heart Cath and Coronary Angiography;  Surgeon: Debby DELENA Sor, MD;  Location: [facility];  Service: Cardiovascular;  Laterality: N/A;   CARPAL TUNNEL RELEASE Bilateral    CHOLECYSTECTOMY  1980   COLONOSCOPY WITH PROPOFOL  N/A 08/30/2017   Procedure: COLONOSCOPY WITH PROPOFOL ;  Surgeon: Unk Corinn Skiff, MD;  Location: [facility] ENDOSCOPY;  Service: Endoscopy;  Laterality: N/A;   DG THUMB LEFT HAND  01/24/2018   repair joint in left thumb   DORSAL COMPARTMENT RELEASE Left 10/25/2014   Procedure: LEFT FIRST  DORSAL COMPARTMENT RELEASE AND RADIAL TENOSYNOVECTOMY ;  Surgeon: Elsie Mussel, MD;  Location: [facility];  Service: Orthopedics;  Laterality: Left;   HAND SURGERY     KNEE SURGERY Right 2012   meniscus tear   KNEE SURGERY  2012   LAMINECTOMY  1995   TUBAL LIGATION  1982   WRIST SURGERY Right    Patient Active Problem List   Diagnosis Date Noted   Seasonal allergies 07/25/2023   Gout involving toe of left foot 07/25/2023   Tremor  10/14/2022   Other chest pain 07/29/2022   Anemia in stage 4 chronic kidney disease (HCC) 07/19/2022   CKD (chronic kidney disease) stage 4, GFR 15-29 ml/min (HCC) 06/03/2022   Seasonal and perennial allergic rhinitis 10/11/2019   Angio-edema 08/30/2019   Obesity (BMI 30-39.9) 02/06/2019   Osteoarthrosis of hand 12/28/2017   Right-sided Bell's palsy 02/02/2016   Spondylolisthesis of lumbar region 08/25/2015   Type 2 diabetes mellitus with stage 4 chronic kidney disease, without long-term current use of insulin (HCC)    History of brain disorder 11/05/2014   Depression, major, recurrent, moderate (HCC) 11/05/2014   Generalized anxiety disorder 10/16/2014   Hypertension associated with diabetes (HCC) 10/14/2014   Epilepsy (HCC) 10/14/2014   Migraine 10/14/2014   Dysthymic disorder 10/14/2014   Dyspepsia  10/14/2014   Hematochezia 10/14/2014   Urge incontinence 10/14/2014   Alopecia 09/10/2014   Blood in feces 09/10/2014   Arthralgia of hip 09/10/2014   Snapping thumb syndrome 04/19/2014   Everitt Quervain's disease (radial styloid tenosynovitis) 03/08/2014   Hyperlipidemia associated with type 2 diabetes mellitus (HCC) 01/15/2013   Heart palpation 01/15/2013   Congestive heart failure (HCC) 11/13/2009   HYPERCALCEMIA 05/08/2007   Clinical depression 10/20/2006   Obstructive apnea 03/22/2006   ONSET DATE: Oct 2024   REFERRING DIAG: Parkinson's Disease   THERAPY DIAG:  Muscle weakness (generalized)  Rationale for Evaluation and Treatment: Rehabilitation  SUBJECTIVE:  SUBJECTIVE STATEMENT: Pt reports some R thumb pain when working with her theraputty at home. Pt accompanied by: Alfreda Grice  PERTINENT HISTORY: Per medical record from [facility] neurology on 10/12/23: Ms. Grulke is a 63 y.o. female PMH anemia, arthritis, anxiety, depression, BMI 33, HTN, HLD, T2DM, CKD4, CHF, gout, OSA, headaches, history of seizures, Bell's palsy, pre-syncope, BL adrenal adenomas, right optic neuropathy, presenting in consultation for evaluation of tremor.   #Parkinson's Disease Patient endorses progressive intermittent bilateral hand tremor present since 2023. On exam has re-emergent hand tremor, jaw tremor, postural instability, rigidity, masked facies consistent with early Parkinson disease. Has additional features of REM sleep behavior disorder and memory loss. Had good initial response to sinemet, will increase dose as patient complains of tremor that worsens later in the day. - Increase Sinemet 25-100mg  QID to 1 pill 8AM, 1 pill noon, 1.5 pills 5PM, 1.5 pills 9PM - After up-titrating sinemet, would start amantadine as second agent given predominant tremor - Continue PT/OT  - Continue bowel regimen   PRECAUTIONS: Fall and Other: seizures  WEIGHT BEARING RESTRICTIONS: No  PAIN: 12/26/23:  mild-moderate R thumb pain with resistive putty exercises Are you having pain? No  FALLS: Has patient fallen in last 6 months? No  LIVING ENVIRONMENT: Lives with: lives with their family, spouse and 69 y/o grandson  Lives in: 1 level home  Stairs: Yes: External: 1 in the front , 5 steps in the back steps; can reach both on the back of the house Has following equipment at home: Single point cane and Walker - 2 wheeled  PLOF: Independent with ADLs/IADLs prior to onset of PD, but has been on disability since the 80's d/t seizures  PATIENT GOALS: I want to get my trembling under control.   OBJECTIVE:  Note: Objective measures were completed at Evaluation unless otherwise noted.  HAND DOMINANCE: Right (L side more involved with tremors)  ADLs: Overall ADLs: Spouse assists as needed Transfers/ambulation related to ADLs: indep-modified indep Eating: sometimes has to eat with 2 hands, some difficulty with utensils, sometimes spills  Grooming: difficulty controlling  electric toothbrush UB Dressing: no difficulties with clothing fasteners, including good ability to hook a bra  LB Dressing: no difficulties Toileting: indep Bathing: intermittent supv,  If she needs me she calls me, per spouse  Tub Shower transfers: supv-min A with tub/shower Equipment: none (spouse reports they are looking in to tearing out tub/shower and replacing with walk in shower)  IADLs: Shopping: daughter gets Arts administrator, though pt attends on shorter shopping trips (wears a mask to avoid germs) Light housekeeping: able to stand and wash dishes but keeps rollator behind her in case of needing rest break Meal Prep: with supv; assist with getting food in/out of the oven d/t tremors; pt reports she requires help to dish up her own plate of food and transfer food to table. Community mobility: indep-modified indep Medication management: indep with weekly pill organizer, some difficulty with pill manipulation   Financial management: indep with auto pay, shared task between pt and spouse Handwriting: 90% legible, shaky; noted very mild micrographia  POSTURE COMMENTS:  rounded shoulders, forward head, and anterior pelvic tilt   ACTIVITY TOLERANCE: Activity tolerance: TBD within functional contexts  UPPER EXTREMITY ROM:  BUEs WFL for ADLs  UPPER EXTREMITY MMT:  BUEs WFL for ADLs  HAND FUNCTION: Grip strength: Right: 54 lbs; Left: 40 lbs, Lateral pinch: Right: 9 lbs, Left: 9 lbs, 3 point pinch: Right: 9 lbs, Left: 8 lbs, and Tip pinch: Right NT lbs, Left: NT lbs  COORDINATION: Finger Nose Finger test: Good with mild tremor bilaterally  9 Hole Peg test: Right: 22 sec; Left: 23 sec  SENSATION: WFL  EDEMA: No visible edema  MUSCLE TONE: RUE: Within functional limits and LUE: Within functional limits  COGNITION: Overall cognitive status: Impaired; hx of memory impairment  VISION: wears glasses  OBSERVATIONS:  Pt pleasant and cooperative and eager to work towards OT goals.                                                                                                                             TREATMENT DATE: 01/10/24:  Self-care:   -Pt. education was provided about compensatory strategies during daily routines for ADLs, and IADL tasks.   -Reviewed strategies for stabilizing coffee at the trunk while scooping, performing hair care, writing. -Reviewed strategies for proximal forearm support to promote more distal control to assist with minimizing tremors during ADL/IADL tasks. -Reviewed strategies for writing. Pt. was able to write one 30 word sentence in 2 min. & 59 sec. In cursive form with 100% legibility, no deviation from the line, and consistent letter size throughout. -Pt. education was provided about weighted items to help minimize tremors including weighted bracelets. -Reviewed A/E to assist Pt. With pouring from heavier containers.  -PATIENT EDUCATION: Education details:   compensatory strategies, weighted bracelets Person educated: Patient and Spouse Education method: Explanation, Demonstration, and Verbal cues Education comprehension: verbalized understanding  HOME EXERCISE PROGRAM: Yellow theraputty  GOALS: Goals reviewed with patient? Yes  SHORT  TERM GOALS: Target date: 01/23/24  Pt will verbalize and implement 1-2 strategies to minimize micrographia and manage tremors for improved handwriting legibility. Baseline: D/C: Met Eval: Initiated education, further training needed Goal status:  Achieved  2.  Pt will be indep to perform HEP for maintaining hand strength and coordination for daily tasks.   Baseline: D/C:  Met Eval: HEP not yet initiated  Goal status: Achieved  LONG TERM GOALS: Target date: 03/05/24  Pt will identify and implement 2-3 strategies to manage BUE tremors to reduce mess with self feeding. Baseline: D/C: Met Eval: Educ not yet provided; pt often eats with 2 hands and reports often makes a mess. Goal status:Achieved  2.  Pt will identify and implement 1-2 strategies for transporting kitchen items/food plate from counter to table with reduced spilling risk. Baseline: D/C: Met Eval: Educ not yet initiated; spouse assists with item transport in kitchen Goal status: IAchieved  3.  Pt will identify and implement 1-2 strategies to enable pouring liquids with reduced risk of spilling.   Baseline: D/C: Met. Eval: Difficulty pouring liquids; spouse assists  Goal status: Achieved  4.  Pt will identify and implement 1-2 strategies to send a legible greeting card.   Baseline: D/C: Met Eval: Handwriting is shaky with reduced legibility; pt reports sending holiday cards brings her a lot of joy, but this has gotten difficult Goal status: Achieved  ASSESSMENT:  CLINICAL IMPRESSION:   Reviewed compensatory strategies with the Pt., and her husband to help minimize tremors at home. Pt. Reports having implemented strategies that work for her  to help her maintain as much independence as possible at home. Pt. Reports having learned enough strategies to continue to implement, and try at home. Pt. Is now appropriate for discharge form OT services. Pt. Is in agreement.  PERFORMANCE DEFICITS: in functional skills including ADLs, IADLs, coordination, dexterity, strength, Fine motor control, Gross motor control, mobility, balance, body mechanics, endurance, decreased knowledge of precautions, decreased knowledge of use of DME, and UE functional use, cognitive skills including memory, and psychosocial skills including coping strategies, environmental adaptation, habits, and routines and behaviors.   IMPAIRMENTS: are limiting patient from ADLs, IADLs, leisure, and social participation.   CO-MORBIDITIES: has co-morbidities such as CKD, spondylolisthesis of lumbar spine, seizure d/o, DM2 that affects occupational performance. Patient will benefit from skilled OT to address above impairments and improve overall function.  MODIFICATION OR ASSISTANCE TO COMPLETE EVALUATION: No modification of tasks or assist necessary to complete an evaluation.  OT OCCUPATIONAL PROFILE AND HISTORY: Detailed assessment: Review of records and additional review of physical, cognitive, psychosocial history related to current functional performance.  CLINICAL DECISION MAKING: Moderate - several treatment options, min-mod task modification necessary  REHAB POTENTIAL: Good  EVALUATION COMPLEXITY: Low    PLAN:  OT FREQUENCY: 1-2x/week  OT DURATION: 12 weeks  PLANNED INTERVENTIONS: 97168 OT Re-evaluation, 97535 self care/ADL training, 02889 therapeutic exercise, 97530 therapeutic activity, 97112 neuromuscular re-education, 97140 manual therapy, 97010 moist heat, 97010 cryotherapy, 97129 Cognitive training (first 15 min), 02249 Physical Performance Testing, balance training, functional mobility training, psychosocial skills training, energy conservation, coping  strategies training, patient/family education, and DME and/or AE instructions  RECOMMENDED OTHER SERVICES: None at this time  CONSULTED AND AGREED WITH PLAN OF CARE: Patient and family Adult nurse (spouse, Koren)  PLAN FOR NEXT SESSION: see above byron Richardson Otter, MS, OTR/L   01/11/2024, 9:16 AM

## 2024-01-12 ENCOUNTER — Encounter: Admitting: Occupational Therapy

## 2024-01-12 ENCOUNTER — Encounter: Payer: Self-pay | Admitting: Oncology

## 2024-01-12 ENCOUNTER — Ambulatory Visit

## 2024-01-12 ENCOUNTER — Ambulatory Visit: Payer: Medicare Other | Admitting: Allergy & Immunology

## 2024-01-17 ENCOUNTER — Ambulatory Visit: Admitting: Occupational Therapy

## 2024-01-17 ENCOUNTER — Ambulatory Visit: Admitting: Allergy & Immunology

## 2024-01-17 ENCOUNTER — Ambulatory Visit: Admitting: Physical Therapy

## 2024-01-19 ENCOUNTER — Ambulatory Visit: Admitting: Physical Therapy

## 2024-01-19 ENCOUNTER — Encounter

## 2024-01-20 ENCOUNTER — Telehealth: Payer: Self-pay

## 2024-01-20 NOTE — Telephone Encounter (Signed)
 Copied from CRM [id number]. Topic: Clinical - Prescription Issue >> Jan 19, 2024  3:33 PM Mia F wrote: Reason for CRM: Pt husband is calling satiating that pt is need of a Libre meter but the paperwork that was recently completed and the paperwork says Hyperglycemia when it should say HYPOglycimia. Paper need to be completed immediately so she can get the meter. They also will like a meter to hold over. Please call [phone] (M)

## 2024-01-21 ENCOUNTER — Other Ambulatory Visit: Payer: Self-pay | Admitting: Family Medicine

## 2024-01-21 DIAGNOSIS — R569 Unspecified convulsions: Secondary | ICD-10-CM

## 2024-01-23 ENCOUNTER — Other Ambulatory Visit (HOSPITAL_COMMUNITY): Payer: Self-pay

## 2024-01-23 NOTE — Telephone Encounter (Signed)
 I am receiving a paid claim on freestyle libre sensors, do we have the paper that the pt is referring to? If so, could it please be scanned into chart. I attempted to call pharmacy, but looks like a paper copy was given, and [facility] does not have a script for pt's freestyle libre sensors. Can we ask pt which pharmacy she is having these filled at, please and thank you

## 2024-01-23 NOTE — Telephone Encounter (Signed)
 Requested Prescriptions  Pending Prescriptions Disp Refills   carbamazepine  (TEGRETOL -XR) 400 MG 12 hr tablet [Pharmacy Med Name: TEGRETOL  XR 400 MG TABLET] 180 tablet 0    Sig: TAKE 1 TABLET BY MOUTH TWICE A DAY (NEED OFFICE VISIT)     Neurology:  Anticonvulsants - carbamazepine  Failed - 01/23/2024 11:33 AM      Failed - Carbamazepine  (serum) in normal range and within 360 days    Carbamazepine , Total  Date Value Ref Range Status  08/09/2013 4.9 4.0 - 12.0 mcg/mL Final   Carbamazepine  (Tegretol ), S  Date Value Ref Range Status  12/16/2021 5.2 4.0 - 12.0 ug/mL Final    Comment:             In conjunction with other antiepileptic drugs                                Therapeutic  4.0 -  8.0                                Toxicity     9.0 - 12.0                                    Carbamazepine  alone                                Therapeutic  8.0 - 12.0                                 Detection Limit =  2.0                           <2.0 indicates None Detected          Failed - HGB in normal range and within 360 days    Hemoglobin  Date Value Ref Range Status  01/03/2024 9.0 (L) 12.0 - 15.0 g/dL Final  97/86/7975 9.4 (L) 11.1 - 15.9 g/dL Final         Failed - Na in normal range and within 360 days    Sodium  Date Value Ref Range Status  07/13/2022 139 135 - 145 mmol/L Final  06/15/2022 143 134 - 144 mmol/L Final  08/16/2014 144 mmol/L Final    Comment:    135-145 NOTE: New Reference Range  07/09/14          Failed - HCT in normal range and within 360 days    HCT  Date Value Ref Range Status  01/03/2024 29.0 (L) 36.0 - 46.0 % Final   Hematocrit  Date Value Ref Range Status  06/15/2022 29.1 (L) 34.0 - 46.6 % Final         Failed - Cr in normal range and within 360 days    Creat  Date Value Ref Range Status  02/28/2017 1.03 0.50 - 1.05 mg/dL Final    Comment:    For patients >74 years of age, the reference limit for Creatinine is approximately 13% higher for  people identified as African-American. .    Creatinine, Ser  Date Value Ref Range Status  07/13/2022 2.24 (H) 0.44 - 1.00 mg/dL Final  Creatinine, POC  Date Value Ref Range Status  07/20/2016 NA mg/dL Final   Creatinine, Urine  Date Value Ref Range Status  12/14/2022 128.6  Final         Passed - AST in normal range and within 360 days    AST  Date Value Ref Range Status  09/08/2023 25 0 - 40 IU/L Final   SGOT(AST)  Date Value Ref Range Status  08/16/2014 54 (H) U/L Final    Comment:    15-41 NOTE: New Reference Range  07/09/14          Passed - ALT in normal range and within 360 days    ALT  Date Value Ref Range Status  09/08/2023 14 0 - 32 IU/L Final   SGPT (ALT)  Date Value Ref Range Status  08/16/2014 49 U/L Final    Comment:    14-54 NOTE: New Reference Range  07/09/14          Passed - WBC in normal range and within 360 days    WBC  Date Value Ref Range Status  09/21/2022 4.9 4.0 - 10.5 K/uL Final   WBC Count  Date Value Ref Range Status  01/03/2024 5.6 4.0 - 10.5 K/uL Final         Passed - PLT in normal range and within 360 days    Platelets  Date Value Ref Range Status  06/15/2022 255 150 - 450 x10E3/uL Final   Platelet Count  Date Value Ref Range Status  01/03/2024 176 150 - 400 K/uL Final         Passed - Completed PHQ-2 or PHQ-9 in the last 360 days      Passed - Valid encounter within last 12 months    Recent Outpatient Visits           2 months ago Depression, major, recurrent, moderate (HCC)   [facility] [place], Jon HERO, MD   4 months ago Swelling at injection site   [facility], [place], PA-C   4 months ago Encounter for annual physical exam   [facility] [place], Jon HERO, MD   6 months ago Seasonal allergies   [facility] [place], Lauraine SAILOR, DO       Future Appointments             In 2 weeks  Iva, Marty Saltness, MD [facility]

## 2024-01-24 ENCOUNTER — Ambulatory Visit: Admitting: Physical Therapy

## 2024-01-24 ENCOUNTER — Encounter

## 2024-01-24 ENCOUNTER — Telehealth: Payer: Self-pay

## 2024-01-24 NOTE — Telephone Encounter (Signed)
 Copied from CRM #[id number]. Topic: Clinical - Prescription Issue >> Jan 24, 2024 11:36 AM Laymon HERO wrote: Reason for CRM: [facility] the [facility] the patient is using for the meter- Processing department # [phone] option 3

## 2024-01-26 ENCOUNTER — Encounter

## 2024-01-26 ENCOUNTER — Ambulatory Visit: Admitting: Physical Therapy

## 2024-01-26 NOTE — Telephone Encounter (Signed)
 I haven't seen or completed any form. I do not know what form he is talking about

## 2024-01-27 ENCOUNTER — Ambulatory Visit: Admitting: Clinical

## 2024-01-27 DIAGNOSIS — F330 Major depressive disorder, recurrent, mild: Secondary | ICD-10-CM

## 2024-01-27 DIAGNOSIS — F411 Generalized anxiety disorder: Secondary | ICD-10-CM

## 2024-01-27 MED ORDER — FREESTYLE LIBRE 3 PLUS SENSOR MISC
0 refills | Status: DC
Start: 2024-01-27 — End: 2024-03-02

## 2024-01-27 NOTE — Progress Notes (Signed)
  Behavioral Health Counselor/Therapist Progress Note  Patient ID: Abigail Figueroa, MRN: [id number],    Date: 01/27/2024  Time Spent: 12:35pm - 1:26pm : 51 minutes   Treatment Type: Individual Therapy  Reported Symptoms: depressed mood  Mental Status Exam: Appearance:  Neat and Well Groomed     Behavior: Appropriate  Motor: Normal  Speech/Language:  Clear and Coherent and Normal Rate  Affect: Appropriate  Mood: Patient stated, I'm a little low today in response to mood.   Thought process: normal  Thought content:   WNL  Sensory/Perceptual disturbances:   WNL  Orientation: oriented to person, place, time/date, and situation  Attention: Good  Concentration: Good  Memory: WNL  Fund of knowledge:  Good  Insight:   Good  Judgment:  Good  Impulse Control: Good   Risk Assessment: Danger to Self:  No Patient denied current suicidal ideation  Self-injurious Behavior: No Danger to Others: No Patient denied current homicidal ideation Duty to Warn:no Physical Aggression / Violence:No  Access to Firearms a concern: No  Gang Involvement:No   Subjective: Patient stated, they've been going pretty good.  Patient stated, everything's been going good at home, I've been to church, everything, its ok in reference to positive events since last session. Patient stated, physically its been rough, I haven't had much energy, the tremors are getting next to me, I've been trying to push my way through in reference to tremors. Patient stated, I've been hanging in, its been rough too dealing with the usual in response to mood since last session. Patient stated, it was rough but I tried in response to homework assignment. Patient stated, I can't change anything that's out of my control. Patient stated, I need to stop dwelling on the negative in response to homework assignment. Patient stated, I feel like I need to stop dwelling on it, can control what church patient chooses  to attend, and stated, I can control me thinking about it all the time in reference to aspects of the situation patient can control. Patient stated, I'm just thinking about it all the time in reference to brother's behaviors/decisions.  Patient stated, that is an assumption in reference to patient's feelings regarding brother's awareness of brother's behaviors. During today's session, patient acknowledged multiple assumptions regarding brother's behaviors. Patient stated, I just believe God doesn't want it this way in reference to situation with brother and barriers to thoughts.   Interventions: Cognitive Behavioral Therapy. Clinician conducted session via caregility video from clinician's home office. Patient provided verbal consent to proceed with telehealth session and is aware of limitations of telephone or video visits. Patient participated in session from patient's home. Reviewed events since last session and assessed for changes. Explored positive events since last session. Discussed recent stressors. Reviewed patient's homework assignment. Assisted patient in identifying additional aspects regarding situation with patient's brother that patient can/can not control. Explored concept of dialectic. Clinician challenged statements/thoughts to assist patient in reframing situation regarding brother. Assisted patient in challenging assumptions related to situation regarding patient's brother. Provided psycho education related to use of positive self talk. Clinician requested for homework patient practice challenging cognitive distortions and practice positive self talk.    Collaboration of Care: not required at this time   Diagnosis:  Mild episode of recurrent major depressive disorder (HCC)    Generalized anxiety disorder   Plan: Patient is to utilize [employer] Therapy, thought re-framing, relaxation techniques, mindfulness, effective communication, and coping strategies to decrease  symptoms associated with Major Depressive Disorder  and Generalized Anxiety Disorder. Frequency: bi-weekly  Modality: individual      Long-term goal:   Patient stated, I want to see a change in the anxiety.      Reduce overall level, frequency, and intensity of the symptoms of anxiety and depression as evidenced by decreased sadness, feelings of anxiety, feeling lost, lack of energy, difficulty falling and staying asleep, changes in concentration, psychomotor retardation, rapid heart rate, excessive worry, restlessness, feeling jittery, and feeling on edge from 5 to 6 days per week to 0 to 1 days per week per patient reported for at least 3 consecutive months.   Target Date: 09/11/24  Progress: progressing    Short-term goal:  Develop coping strategies to utilize in response to symptoms of depression/anxiety and stressors  Target Date: 09/11/24  Progress: progressing    Verbally express patient's thoughts and feelings to others and utilize effective communication strategies when expressing patient's thoughts/feelings    Target Date: 09/11/24  Progress: progressing    Verbalize an understanding of the relationship between symptoms of anxiety and the impact on patient's thought patterns and behaviors    Target Date: 09/11/24  Progress: progressing    Develop strategies for patient to utilize to support patient's daughter and grandson as it relates to grandson using illegal substances.  Target Date: 09/11/24  Progress: progressing    Develop and implement coping strategies for patient to utilize in response to grandson using illegal substances Target Date: 09/11/24  Progress: progressing    Darice Seats, LCSW

## 2024-01-27 NOTE — Progress Notes (Signed)
   Darice Seats, LCSW

## 2024-01-27 NOTE — Addendum Note (Signed)
 Addended by: LILIAN SEVERO RAMAN on: 01/27/2024 10:21 AM   Modules accepted: Orders

## 2024-01-27 NOTE — Telephone Encounter (Signed)
 Patient advised. Verbalized understanding. Advised of information faxed over and that if they are needing additional info she may have to ask endo to handle her rx since they are managing her DM II and we only have record of one hypoglycemic episode. She verbalized understanding and wanted to see if she could receive samples for the upcoming month. Sample provided and left at the front desk

## 2024-01-30 DIAGNOSIS — N2581 Secondary hyperparathyroidism of renal origin: Secondary | ICD-10-CM | POA: Diagnosis not present

## 2024-01-30 DIAGNOSIS — I129 Hypertensive chronic kidney disease with stage 1 through stage 4 chronic kidney disease, or unspecified chronic kidney disease: Secondary | ICD-10-CM | POA: Diagnosis not present

## 2024-01-30 DIAGNOSIS — N184 Chronic kidney disease, stage 4 (severe): Secondary | ICD-10-CM | POA: Diagnosis not present

## 2024-01-30 DIAGNOSIS — E1122 Type 2 diabetes mellitus with diabetic chronic kidney disease: Secondary | ICD-10-CM | POA: Diagnosis not present

## 2024-01-31 ENCOUNTER — Ambulatory Visit: Admitting: Physical Therapy

## 2024-01-31 ENCOUNTER — Encounter

## 2024-02-01 ENCOUNTER — Other Ambulatory Visit: Payer: Self-pay

## 2024-02-01 NOTE — Telephone Encounter (Signed)
 OV Note, POCT lab and forms completed by Dr.B on 12/12/23 refaxed. Copies are available at providers nurse station until concern resolved.

## 2024-02-01 NOTE — Telephone Encounter (Signed)
 Copied from CRM #[id number]. Topic: General - Other >> Feb 01, 2024  3:55 PM Turkey B wrote: Reason for CRM: [place] from [facility], called in, states still needs, office notes, if patient has had history  of level 2 or 3 hypoglycemic events . Phone is [phone] opt 3 fx is [phone] >> Feb 01, 2024  4:03 PM Delon D wrote: Please clarify what needs to be sent

## 2024-02-01 NOTE — Telephone Encounter (Signed)
 I sent this fax to [facility] to them on 01/27/24.

## 2024-02-06 ENCOUNTER — Encounter

## 2024-02-06 DIAGNOSIS — D631 Anemia in chronic kidney disease: Secondary | ICD-10-CM | POA: Diagnosis not present

## 2024-02-06 DIAGNOSIS — N184 Chronic kidney disease, stage 4 (severe): Secondary | ICD-10-CM | POA: Diagnosis not present

## 2024-02-06 DIAGNOSIS — E1122 Type 2 diabetes mellitus with diabetic chronic kidney disease: Secondary | ICD-10-CM | POA: Diagnosis not present

## 2024-02-06 DIAGNOSIS — I10 Essential (primary) hypertension: Secondary | ICD-10-CM | POA: Diagnosis not present

## 2024-02-09 ENCOUNTER — Ambulatory Visit: Admitting: Allergy & Immunology

## 2024-02-09 DIAGNOSIS — G43719 Chronic migraine without aura, intractable, without status migrainosus: Secondary | ICD-10-CM | POA: Diagnosis not present

## 2024-02-09 DIAGNOSIS — M791 Myalgia, unspecified site: Secondary | ICD-10-CM | POA: Diagnosis not present

## 2024-02-09 DIAGNOSIS — M542 Cervicalgia: Secondary | ICD-10-CM | POA: Diagnosis not present

## 2024-02-10 ENCOUNTER — Ambulatory Visit: Admitting: Clinical

## 2024-02-13 ENCOUNTER — Encounter

## 2024-02-15 ENCOUNTER — Encounter

## 2024-02-15 NOTE — Telephone Encounter (Signed)
 faxed

## 2024-02-17 ENCOUNTER — Inpatient Hospital Stay

## 2024-02-17 ENCOUNTER — Telehealth: Payer: Self-pay | Admitting: Oncology

## 2024-02-17 NOTE — Telephone Encounter (Signed)
 Pt called and left vm to r/s appts  I called pt back and spoke with her. Appts are r/s from today 10/17 to wed 10/22.   Abigail Figueroa, will the future lab/injections need to be adjusted as well?

## 2024-02-20 ENCOUNTER — Encounter

## 2024-02-22 ENCOUNTER — Inpatient Hospital Stay: Attending: Oncology

## 2024-02-22 ENCOUNTER — Inpatient Hospital Stay

## 2024-02-22 ENCOUNTER — Encounter

## 2024-02-22 VITALS — BP 131/63 | HR 68

## 2024-02-22 DIAGNOSIS — D631 Anemia in chronic kidney disease: Secondary | ICD-10-CM | POA: Diagnosis present

## 2024-02-22 DIAGNOSIS — N184 Chronic kidney disease, stage 4 (severe): Secondary | ICD-10-CM | POA: Insufficient documentation

## 2024-02-22 LAB — HEMOGLOBIN AND HEMATOCRIT (CANCER CENTER ONLY)
HCT: 29.2 % — ABNORMAL LOW (ref 36.0–46.0)
Hemoglobin: 9.4 g/dL — ABNORMAL LOW (ref 12.0–15.0)

## 2024-02-22 MED ORDER — EPOETIN ALFA-EPBX 20000 UNIT/ML IJ SOLN
20000.0000 [IU] | Freq: Once | INTRAMUSCULAR | Status: AC
  Administered 2024-02-22: 20000 [IU] via SUBCUTANEOUS
  Filled 2024-02-22: qty 1

## 2024-02-24 ENCOUNTER — Ambulatory Visit: Admitting: Clinical

## 2024-02-24 DIAGNOSIS — F330 Major depressive disorder, recurrent, mild: Secondary | ICD-10-CM | POA: Diagnosis not present

## 2024-02-24 DIAGNOSIS — F411 Generalized anxiety disorder: Secondary | ICD-10-CM | POA: Diagnosis not present

## 2024-02-24 NOTE — Progress Notes (Signed)
   Darice Seats, LCSW

## 2024-02-24 NOTE — Progress Notes (Signed)
 [facility] Counselor/Therapist Progress Note  Patient ID: Abigail Figueroa, MRN: [id number],    Date: 02/24/2024  Time Spent: 12:35pm - 1:33pm : 58 minutes   Treatment Type: Individual Therapy  Reported Symptoms: depressed mood  Mental Status Exam: Appearance:  Neat and Well Groomed     Behavior: Appropriate  Motor: Normal  Speech/Language:  Clear and Coherent and Normal Rate  Affect: Appropriate  Mood: Patient stated, I feel a little down but I'm ok in response to current mood.   Thought process: normal  Thought content:   WNL  Sensory/Perceptual disturbances:   WNL  Orientation: oriented to person, place, time/date, and situation  Attention: Good  Concentration: Good  Memory: WNL  Fund of knowledge:  Good  Insight:   Good  Judgment:  Good  Impulse Control: Good   Risk Assessment: Danger to Self:  No Patient denied current suicidal ideation  Self-injurious Behavior: No Danger to Others: No Patient denied current homicidal ideation Duty to Warn:no Physical Aggression / Violence:No  Access to Firearms a concern: No  Gang Involvement:No   Subjective: Patient stated, Jesus been doing pretty good, hanging in there, taking one day at a time in response to events since last session. Patient stated, I've been a little tired, my energy is low, I've been dealing with it. Patient stated, still trying to cope with some things in reference to situation with brother. Patient stated, trying to make some decisions about it, still trying to make decision if I should go to another church or what I should do.  Patient stated, I know I need to but I don't want to in reference to changing churches. Patient stated, there's not anyone I can trust in reference to seeking faith based support/guidance regarding patient's faith and challenges related to patient's faith. Patient identified the following pros related to patient's decision: patient stated, being able to  sit in the audience and my mind to be free and open and be focused on the word, I don't have to worry about my family, if there hurt or not hurting, I'm free of that, I can stay focused on the word, my mind wont be wondering, I can receive God's word and take it with me. Patient identified the following cons related to patient's decision: patient stated, will miss my church family, I would miss seeing my brother and his family, I wont be able to stay in contact with him (brother), creating distance in relationship with brother, I love Sunday school. Patient stated, if I was to leave and went to another church that would destroy him (brother). Patient reported patient's pros are patient's priority. Patient reported plans to discuss decision with patient's family and stated, that'll be my homework.   Interventions: Cognitive Behavioral Therapy. Clinician conducted session via caregility video from clinician's home office. Patient provided verbal consent to proceed with telehealth session and is aware of limitations of telephone or video visits. Patient participated in session from patient's home. Reviewed events since last session and assessed for changes. Discussed patient's thoughts/feelings related to pending decision. Assisted patient in exploring and identifying pros/cons related to patient's pending decision. Assisted patient in prioritizing pros/cons related to patient's pending decision. Discussed initiating a conversation with family members regarding patient's decision.    Collaboration of Care: not required at this time   Diagnosis:  Mild episode of recurrent major depressive disorder (HCC)    Generalized anxiety disorder   Plan: Patient is to utilize [employer] Therapy, thought re-framing, relaxation  techniques, mindfulness, effective communication, and coping strategies to decrease symptoms associated with Major Depressive Disorder and Generalized Anxiety  Disorder. Frequency: bi-weekly  Modality: individual      Long-term goal:   Patient stated, I want to see a change in the anxiety.      Reduce overall level, frequency, and intensity of the symptoms of anxiety and depression as evidenced by decreased sadness, feelings of anxiety, feeling lost, lack of energy, difficulty falling and staying asleep, changes in concentration, psychomotor retardation, rapid heart rate, excessive worry, restlessness, feeling jittery, and feeling on edge from 5 to 6 days per week to 0 to 1 days per week per patient reported for at least 3 consecutive months.   Target Date: 09/11/24  Progress: progressing    Short-term goal:  Develop coping strategies to utilize in response to symptoms of depression/anxiety and stressors  Target Date: 09/11/24  Progress: progressing    Verbally express patient's thoughts and feelings to others and utilize effective communication strategies when expressing patient's thoughts/feelings    Target Date: 09/11/24  Progress: progressing    Verbalize an understanding of the relationship between symptoms of anxiety and the impact on patient's thought patterns and behaviors    Target Date: 09/11/24  Progress: progressing    Develop strategies for patient to utilize to support patient's daughter and grandson as it relates to grandson using illegal substances.  Target Date: 09/11/24  Progress: progressing    Develop and implement coping strategies for patient to utilize in response to grandson using illegal substances Target Date: 09/11/24  Progress: progressing       Darice Seats, LCSW

## 2024-02-27 ENCOUNTER — Encounter

## 2024-02-27 NOTE — Progress Notes (Unsigned)
 Cardiology Office Note:    Date:  02/29/2024   ID:  Abigail Figueroa, DOB [birth year], MRN [id number]  PCP:  [name], MD   [facility] Providers Cardiologist:  [name], MD Cardiology APP:  [name], PA     Previously followed by Dr. Burnard.  Referring MD: [name], MD   Chief Complaint  Patient presents with   Follow-up    palpitations    History of Present Illness:    Abigail Figueroa is a 63 y.o. female with a hx of hypertension, palpitations, hyperlipidemia, OSA on CPAP, seizure disorder, DM 2, Parkinson's disease, bilateral adrenal adenomas, primary aldosteronism, and anemia.   Heart catheterization 08/2015 with normal coronaries and normal LV function.  This was in response to an abnormal stress test most likely due to body habitus.  Unfortunately she stopped using her CPAP machine because it started making a loud noise.  She was seen 01/09/21 with [name] NP. She reported chest discomfort and arm pain at rest that sounded atypical. No ischemic evaluation planned. I saw her in follow up 01/07/22 with better compliance on CPAP but increased sodium in her diet leading to DOE, weight gain, and lower extremity edema.  I advised to reduce salt and increase lasix  to 40 / 20 mg x 5 days, then 40 mg daily with an extra 20 mg three times weekly.   Unfortunately, BMP reveals worsening renal function, confirmed on recheck. I advised to reduce lasix  to 40 mg daily and to stay hydrated. I referred to nephrology.  Echo was repeated and showed LVEF 60-65%, grade 1 DD, normal RV, and no significant valvular disease.   When I saw her back 04/12/22 she reported palpitations several times weekly but did not want to wear a heart monitor. Chest discomfort treated medically given reassuring heart cath in 2017 and renal function precluding CCTA, prior nuclear stress test with false positive. She was doing well when she saw Dr. Burnard in Jan 2024. She saw  [name] NP 07/2022. Imdur  was stopped due to persistent headaches. BP now managed with increased dose of amlodipine .  She was very concerned about sarcoid as she has a family history (mother, uncle).   Per NP Finder: We could actually do the PET scan (it will not negatively effect the kidneys as it does not use contrast eye) or an MRI. With PET we can see active inflammation but the MRI lets see better if there is or isn't sarcoid but harder to see which is new vs old. Both are reasonable choices with pros and cons.    In speaking with Dr. Loni (our cardiologist who specializes in imaging) she said we can start with either test. MRI is a long exam (probably at least 45 minutes) whereas PET is shorter test (probably 10 minutes) but there is a diet prep you have to do beforehand. With the pre-PET diet you have to do a high fat, zero carb diet for 24 hours prior to the test to get the most accurate results. Our imaging team can give more specific details on how to follow that diet one day before the CT.  I saw her for follow-up of an ER visit 07/13/2022 for chest pain and syncope which was unwitnessed.  Workup was largely negative and she was back to baseline without admission.  She had an additional possible syncopal episode when she fell back on the stairs-this episode was witnessed but unclear if she had loss of  consciousness.  Given that she had 2 episodes of questionable syncope in 6 weeks I opted to place a 30-day monitor.  Heart monitor was overall reassuring without pauses, A-fib, or significant arrhythmia without heart block.  She also had a nuclear stress test 07/21/2022 that was nonischemic.  She presents today for routine follow-up. EKG initially concerning for atypical atrial flutter. However, subsequent EKG and rhythm strip shows NSR without ST changes.   In the interim, has been diagnosed with parkinson's, primary aldosteronism, bilateral adrenal adenomas, following with endocrinology  and nephrology.   She reports palpitations over the last 2 months, but no frank chest pain.    Past Medical History:  Diagnosis Date   Abnormal stress test    a. 10/2009: Normal myocardial perfusion imaging; b. 08/2015 MV: medium defect of mod severity in mid ant apical region w/ mild HK of the distal inf wall;  c. 08/2015 Cath: nl Cors, EF 60%.   Allergy     Not sure   Anemia    Angio-edema    Anxiety    Arthritis    Bell's palsy    CHF (congestive heart failure) (HCC)    Not sure   Chronic kidney disease 2024   Depression    Dermatitis 09/10/2014   Edema    Essential hypertension    Frequent urination    Frequent urination at night    Headache    migraines, gets botox injections   Heart palpitations 10/2011   Event monitor showing sinus tachycardia, PACs with couplets and triplets.   Hx of echocardiogram    a. 10/2009 Echo: showed normal left ventricular function, mild left ventricular hypertrophy, and no significant valve abnormalities.   Hypercholesterolemia    Migraine    Morbid obesity (HCC)    Neuromuscular disorder (HCC)    OSA (obstructive sleep apnea)    has been on continuous positive airway pressure   Seizure disorder (HCC)    Seizures (HCC)    Sleep apnea    Not sure   Type II diabetes mellitus (HCC)    Urticaria     Past Surgical History:  Procedure Laterality Date   ABDOMINAL HYSTERECTOMY  1990   without BSO   BACK SURGERY  1900s 2001   x2 with cage put in   BILATERAL SALPINGOOPHORECTOMY  1990   CARDIAC CATHETERIZATION N/A 08/06/2015   Procedure: Left Heart Cath and Coronary Angiography;  Surgeon: Debby DELENA Sor, MD;  Location: [facility];  Service: Cardiovascular;  Laterality: N/A;   CARPAL TUNNEL RELEASE Bilateral    CHOLECYSTECTOMY  1980   COLONOSCOPY WITH PROPOFOL  N/A 08/30/2017   Procedure: COLONOSCOPY WITH PROPOFOL ;  Surgeon: Unk Corinn Skiff, MD;  Location: [facility];  Service: Endoscopy;  Laterality: N/A;   DG THUMB LEFT HAND   01/24/2018   repair joint in left thumb   DORSAL COMPARTMENT RELEASE Left 10/25/2014   Procedure: LEFT FIRST  DORSAL COMPARTMENT RELEASE AND RADIAL TENOSYNOVECTOMY ;  Surgeon: Elsie Mussel, MD;  Location: [facility];  Service: Orthopedics;  Laterality: Left;   HAND SURGERY     KNEE SURGERY Right 2012   meniscus tear   KNEE SURGERY  2012   LAMINECTOMY  1995   TUBAL LIGATION  1982   WRIST SURGERY Right     Current Medications: Current Meds  Medication Sig   ARIPiprazole (ABILIFY) 10 MG tablet SMARTSIG:1 Tablet(s) By Mouth Every Evening   Azelastine  HCl 0.15 % SOLN PLACE 2 SPRAYS INTO BOTH NOSTRILS 2 (  TWO) TIMES DAILY   Azelastine -Fluticasone  (DYMISTA ) 137-50 MCG/ACT SUSP PLACE 2 SPRAYS INTO BOTH NOSTRILS IN THE MORNING AND AT BEDTIME.   BOTOX 100 UNITS SOLR injection Inject into the muscle every 3 (three) months.    busPIRone  (BUSPAR ) 15 MG tablet TAKE 1 TABLET BY MOUTH TWICE A DAY   carbamazepine  (TEGRETOL -XR) 400 MG 12 hr tablet TAKE 1 TABLET BY MOUTH TWICE A DAY (NEED OFFICE VISIT)   carbidopa-levodopa (SINEMET IR) 25-100 MG tablet Take 1 tablet by mouth Three (3) times a day. Start with 1/2 pill three times a day, after a week can increase to 1 pill three times a day.   cetirizine  (ZYRTEC ) 10 MG tablet TAKE 1 TABLET BY MOUTH EVERY DAY   chlorproMAZINE  (THORAZINE ) 25 MG tablet Take 50 mg by mouth 2 (two) times daily as needed.   chlorzoxazone (PARAFON) 500 MG tablet Take by mouth 4 (four) times daily as needed for muscle spasms.   clotrimazole -betamethasone  (LOTRISONE ) cream Apply 1 Application topically 2 (two) times daily.   Continuous Glucose Sensor (FREESTYLE LIBRE 3 PLUS SENSOR) MISC Change sensor every 15 days.   EMGALITY 120 MG/ML SOAJ Inject into the skin as needed.   EPINEPHrine  0.3 mg/0.3 mL IJ SOAJ injection Inject into the muscle.   ezetimibe  (ZETIA ) 10 MG tablet TAKE 1 TABLET BY MOUTH EVERY DAY   fluocinolone (SYNALAR) 0.01 % external solution Apply  topically as needed.   FLUoxetine (PROZAC) 10 MG capsule Take 40 mg by mouth daily.   fluticasone  (FLONASE ) 50 MCG/ACT nasal spray PLACE 2 SPRAYS INTO BOTH NOSTRILS IN THE MORNING AND AT BEDTIME.   furosemide  (LASIX ) 40 MG tablet TAKE 1 TABLET BY MOUTH EVERY DAY   hydroxypropyl methylcellulose / hypromellose (ISOPTO TEARS / GONIOVISC) 2.5 % ophthalmic solution Place 1 drop into both eyes 3 (three) times daily as needed for dry eyes.   lamoTRIgine  (LAMICTAL ) 100 MG tablet Take 100 mg by mouth 2 (two) times daily.   Lancets (ONETOUCH ULTRASOFT) lancets Test fasting each morning and 2 hours before supper. Retest if having hypoglycemic symptoms.   levocetirizine (XYZAL ) 5 MG tablet TAKE 1 TABLET (5 MG TOTAL) BY MOUTH 2 TIMES A WEEK   metoprolol  tartrate (LOPRESSOR ) 100 MG tablet TAKE 1 TABLET BY MOUTH TWICE A DAY   montelukast  (SINGULAIR ) 10 MG tablet TAKE 1 TABLET BY MOUTH EVERYDAY AT BEDTIME   ONETOUCH VERIO test strip TEST FASTING GLUCOSE DAILY AS DIRECTED   promethazine  (PHENERGAN ) 25 MG tablet Take 25 mg by mouth every 4 (four) hours as needed.   rosuvastatin  (CRESTOR ) 20 MG tablet TAKE 1 TABLET BY MOUTH EVERY DAY   Semaglutide ,0.25 or 0.5MG /DOS, (OZEMPIC , 0.25 OR 0.5 MG/DOSE,) 2 MG/3ML SOPN Inject 0.25 mg into the skin once a week.   tretinoin (RETIN-A) 0.025 % cream Apply topically at bedtime.   triamcinolone  ointment (KENALOG ) 0.1 % Apply 1 Application topically 2 (two) times daily.   [DISCONTINUED] amLODipine  (NORVASC ) 5 MG tablet TAKE 1.5 TABLET (7.5MG ) BY MOUTH DAILY. (Patient taking differently: Take 5 mg by mouth daily. TAKE 1.5 TABLET (7.5MG ) BY MOUTH DAILY.)   [DISCONTINUED] doxycycline  (VIBRA -TABS) 100 MG tablet Take 1 tablet (100 mg total) by mouth 2 (two) times daily.     Allergies:   Morphine, Morphine and codeine, Oxycodone , Oxycodone  hcl, Aimovig [erenumab-aooe], Augmentin  [amoxicillin -pot clavulanate], and Morphine sulfate   Social History   Socioeconomic History   Marital  status: Married    Spouse name: Not on file   Number of children: 2  Years of education: Not on file   Highest education level: 12th grade  Occupational History   Occupation: disabled  Tobacco Use   Smoking status: Former    Current packs/day: 0.00    Types: Cigarettes    Quit date: 01/03/1983    Years since quitting: 41.1   Smokeless tobacco: Never   Tobacco comments:    quit in 1984  Vaping Use   Vaping status: Never Used  Substance and Sexual Activity   Alcohol use: No   Drug use: No   Sexual activity: Never  Other Topics Concern   Not on file  Social History Narrative   ** Merged History Encounter ** She is a married mother of 2, grandmother 3. She tries to get exercise but is not doing any routine program.She quit smoking over 25 years ago does not drink alcohol.   No caffiene since 03/2022.    Social Drivers of Corporate Investment Banker Strain: Low Risk  (12/14/2023)   Overall Financial Resource Strain (CARDIA)    Difficulty of Paying Living Expenses: Not very hard  Food Insecurity: No Food Insecurity (12/14/2023)   Hunger Vital Sign    Worried About Running Out of Food in the Last Year: Never true    Ran Out of Food in the Last Year: Never true  Transportation Needs: No Transportation Needs (12/14/2023)   PRAPARE - Administrator, Civil Service (Medical): No    Lack of Transportation (Non-Medical): No  Physical Activity: Insufficiently Active (12/14/2023)   Exercise Vital Sign    Days of Exercise per Week: 3 days    Minutes of Exercise per Session: 20 min  Stress: No Stress Concern Present (12/14/2023)   [employer] of Occupational Health - Occupational Stress Questionnaire    Feeling of Stress: Not at all  Social Connections: Socially Integrated (12/14/2023)   Social Connection and Isolation Panel    Frequency of Communication with Friends and Family: More than three times a week    Frequency of Social Gatherings with Friends and Family: Twice a  week    Attends Religious Services: More than 4 times per year    Active Member of [employer] or Organizations: Yes    Attends Engineer, Structural: More than 4 times per year    Marital Status: Married     Family History: The patient's family history includes Alzheimer's disease in her maternal grandmother; Dementia in her maternal grandmother; Diabetes in her maternal grandfather; Heart attack in her mother; Hypertension in her maternal grandfather; Sarcoidosis in her mother; Seizures in her mother. There is no history of Breast cancer, Allergic rhinitis, Asthma, Eczema, or Urticaria.  ROS:   Please see the history of present illness.     All other systems reviewed and are negative.  EKGs/Labs/Other Studies Reviewed:    The following studies were reviewed today:  EKG Interpretation Date/Time:  Wednesday February 29 2024 10:15:35 EDT Ventricular Rate:  69 PR Interval:  110 QRS Duration:  100 QT Interval:  394 QTC Calculation: 422 R Axis:   2  Text Interpretation: Sinus rhythm with short PR Moderate voltage criteria for LVH, may be normal variant ( R in aVL , Cornell product ) ST elevation, consider early repolarization, pericarditis, or injury T wave abnormality, consider inferior ischemia When compared with ECG of 07-Apr-2023 09:40, ST elevation now present in Inferior leads ST elevation now present in Lateral leads Confirmed by Madie Slough ([id number]) on 02/29/2024 10:23:45 AM    Recent  Labs: 09/08/2023: ALT 14 01/03/2024: Platelet Count 176 02/22/2024: Hemoglobin 9.4  Recent Lipid Panel    Component Value Date/Time   CHOL 150 09/08/2023 1017   CHOL 277 (H) 08/16/2014 0843   TRIG 61 09/08/2023 1017   TRIG 167 (H) 08/16/2014 0843   HDL 83 09/08/2023 1017   HDL 77 08/16/2014 0843   CHOLHDL 1.8 09/08/2023 1017   CHOLHDL 2.8 02/27/2019 0818   VLDL 22 02/27/2019 0818   VLDL 33 08/16/2014 0843   LDLCALC 55 09/08/2023 1017   LDLCALC 190 (H) 02/28/2017 1009   LDLCALC 167 (H)  08/16/2014 0843     Risk Assessment/Calculations:                Physical Exam:    VS:  BP 116/72 (BP Location: Right Arm, Patient Position: Sitting, Cuff Size: Normal)   Pulse 70   Ht 5' 3 (1.6 m)   Wt 184 lb (83.5 kg)   LMP  (LMP Unknown)   SpO2 97%   BMI 32.59 kg/m     Wt Readings from Last 3 Encounters:  02/29/24 184 lb (83.5 kg)  01/06/24 182 lb 4.8 oz (82.7 kg)  11/01/23 182 lb 11.2 oz (82.9 kg)     GEN:  Well nourished, well developed in no acute distress HEENT: Normal NECK: No JVD; No carotid bruits LYMPHATICS: No lymphadenopathy CARDIAC: RRR, no murmurs, rubs, gallops RESPIRATORY:  Clear to auscultation without rales, wheezing or rhonchi  ABDOMEN: Soft, non-tender, non-distended MUSCULOSKELETAL:  No edema; No deformity  SKIN: Warm and dry NEUROLOGIC:  Alert and oriented x 3 PSYCHIATRIC:  Normal affect   ASSESSMENT:    1. Palpitations   2. Nonspecific abnormal electrocardiogram (ECG) (EKG)   3. CKD (chronic kidney disease) stage 4, GFR 15-29 ml/min (HCC)   4. Mixed hyperlipidemia   5. Essential hypertension   6. Obstructive apnea    PLAN:    In order of problems listed above:  Abnormal EKG - initial EKG today concerning for atrial flutter vs artifact - repeat EKG with SR - will defer OAC for now and place a heart monitor, obtain echo - she does report recent fatigue, but also with several new diagnoses and medication changes   Palpitations - already on lopressor  100 mg BID - she has been bothered by palpitaions 1-2 times weekly x 2 months - palpitations last 1-2 minutes at a time - Mg 2.4 in 10/2023 - heart monitor as above   CKD followed by nephrology Bilateral adrenal adenomas Primary aldosteronism Now followed by nephrology and endocrinology Intermittent increase in lasix  resulted in worsening renal function, difficult to control volume status with dietary indiscretion   Hypertension - metoprolol  100 mg BID, 5 mg amlodipine , and  40 mg furosemide  as needed (has not taken in 3-4 months)  - did not tolerate isordil  due to persistent headaches   OSA on CPAP Now compliant with new mask   Hyperlipidemia with LDL goal < 70 Clean coronaries by cath in 2017 Continue statin and zetia  Lipid panel 09/2023: LDL 55 HDL 83 Trig 61   DM2 A1c ?5%, per patient   Follow up in 6 weeks after heart monitor and echo - will meet Dr. Lavona.       Medication Adjustments/Labs and Tests Ordered: Current medicines are reviewed at length with the patient today.  Concerns regarding medicines are outlined above.  Orders Placed This Encounter  Procedures   LONG TERM MONITOR (3-14 DAYS)   EKG 12-Lead   ECHOCARDIOGRAM COMPLETE  Meds ordered this encounter  Medications   amLODipine  (NORVASC ) 5 MG tablet    Sig: Take 1 tablet (5 mg total) by mouth daily.    Patient Instructions  Medication Instructions:    Your physician recommends that you continue on your current medications as directed. Please refer to the Current Medication list given to you today.  *If you need a refill on your cardiac medications before your next appointment, please call your pharmacy*   Lab Work: NONE ORDERED  TODAY    If you have labs (blood work) drawn today and your tests are completely normal, you will receive your results only by: MyChart Message (if you have MyChart) OR A paper copy in the mail If you have any lab test that is abnormal or we need to change your treatment, we will call you to review the results.    Testing/Procedures: IN  3 WEEKS   Your physician has requested that you have an echocardiogram. Echocardiography is a painless test that uses sound waves to create images of your heart. It provides your doctor with information about the size and shape of your heart and how well your heart's chambers and valves are working. This procedure takes approximately one hour. There are no restrictions for this procedure. Please do NOT  wear cologne, perfume, aftershave, or lotions (deodorant is allowed). Please arrive 15 minutes prior to your appointment time.  Please note: We ask at that you not bring children with you during ultrasound (echo/ vascular) testing. Due to room size and safety concerns, children are not allowed in the ultrasound rooms during exams. Our front office staff cannot provide observation of children in our lobby area while testing is being conducted. An adult accompanying a patient to their appointment will only be allowed in the ultrasound room at the discretion of the ultrasound technician under special circumstances. We apologize for any inconvenience.   Your physician has recommended that you wear an event monitor. Event monitors are medical devices that record the heart's electrical activity. Doctors most often us  these monitors to diagnose arrhythmias. Arrhythmias are problems with the speed or rhythm of the heartbeat. The monitor is a small, portable device. You can wear one while you do your normal daily activities. This is usually used to diagnose what is causing palpitations/syncope (passing out).    Follow-Up: At [facility], you and your health needs are our priority.  As part of our continuing mission to provide you with exceptional heart care, our providers are all part of one team.  This team includes your primary Cardiologist (physician) and Advanced Practice Providers or APPs (Physician Assistants and Nurse Practitioners) who all work together to provide you with the care you need, when you need it.  Your next appointment:     6 week(s)  Provider:   [name], MD    We recommend signing up for the patient portal called MyChart.  Sign up information is provided on this After Visit Summary.  MyChart is used to connect with patients for Virtual Visits (Telemedicine).  Patients are able to view lab/test results, encounter notes, upcoming appointments, etc.  Non-urgent  messages can be sent to your provider as well.   To learn more about what you can do with MyChart, go to [email or web address].   Other Instructions  ZIO XT- Long Term Monitor Instructions  Your physician has requested you wear a ZIO patch monitor for 14 days.  This is a single patch monitor. [employer] supplies one patch monitor  per enrollment. Additional stickers are not available. Please do not apply patch if you will be having a Nuclear Stress Test,  Echocardiogram, Cardiac CT, MRI, or Chest Xray during the period you would be wearing the  monitor. The patch cannot be worn during these tests. You cannot remove and re-apply the  ZIO XT patch monitor.  Your ZIO patch monitor will be mailed 3 day USPS to your address on file. It may take 3-5 days  to receive your monitor after you have been enrolled.  Once you have received your monitor, please review the enclosed instructions. Your monitor  has already been registered assigning a specific monitor serial # to you.  Billing and Patient Assistance Program Information  We have supplied [employer] with any of your insurance information on file for billing purposes. [employer] offers a sliding scale Patient Assistance Program for patients that do not have  insurance, or whose insurance does not completely cover the cost of the ZIO monitor.  You must apply for the Patient Assistance Program to qualify for this discounted rate.  To apply, please call [employer] at [phone], select option 4, select option 2, ask to apply for  Patient Assistance Program. Meredeth will ask your household income, and how many people  are in your household. They will quote your out-of-pocket cost based on that information.  [employer] will also be able to set up a 29-month, interest-free payment plan if needed.  Applying the monitor   Shave hair from upper left chest.  Hold abrader disc by orange tab. Rub abrader in 40 strokes over the upper left chest as  indicated in  your monitor instructions.  Clean area with 4 enclosed alcohol pads. Let dry.  Apply patch as indicated in monitor instructions. Patch will be placed under collarbone on left  side of chest with arrow pointing upward.  Rub patch adhesive wings for 2 minutes. Remove white label marked 1. Remove the white  label marked 2. Rub patch adhesive wings for 2 additional minutes.  While looking in a mirror, press and release button in center of patch. A small green light will  flash 3-4 times. This will be your only indicator that the monitor has been turned on.  Do not shower for the first 24 hours. You may shower after the first 24 hours.  Press the button if you feel a symptom. You will hear a small click. Record Date, Time and  Symptom in the Patient Logbook.  When you are ready to remove the patch, follow instructions on the last 2 pages of Patient  Logbook. Stick patch monitor onto the last page of Patient Logbook.  Place Patient Logbook in the blue and white box. Use locking tab on box and tape box closed  securely. The blue and white box has prepaid postage on it. Please place it in the mailbox as  soon as possible. Your physician should have your test results approximately 7 days after the  monitor has been mailed back to [facility].  Call [facility] Customer Care at [phone] if you have questions regarding  your ZIO XT patch monitor. Call them immediately if you see an orange light blinking on your  monitor.  If your monitor falls off in less than 4 days, contact our Monitor department at [phone].  If your monitor becomes loose or falls off after 4 days call [employer] at [phone] for  suggestions on securing your monitor           Signed, Jon  Nat Hails, [place]  02/29/2024 11:47 AM    [facility]

## 2024-02-29 ENCOUNTER — Other Ambulatory Visit: Payer: Self-pay

## 2024-02-29 ENCOUNTER — Encounter

## 2024-02-29 ENCOUNTER — Ambulatory Visit: Attending: Physician Assistant | Admitting: Physician Assistant

## 2024-02-29 ENCOUNTER — Encounter: Payer: Self-pay | Admitting: Physician Assistant

## 2024-02-29 ENCOUNTER — Ambulatory Visit: Attending: Physician Assistant

## 2024-02-29 ENCOUNTER — Encounter: Payer: Self-pay | Admitting: Oncology

## 2024-02-29 VITALS — BP 116/72 | HR 70 | Ht 63.0 in | Wt 184.0 lb

## 2024-02-29 DIAGNOSIS — I10 Essential (primary) hypertension: Secondary | ICD-10-CM

## 2024-02-29 DIAGNOSIS — R002 Palpitations: Secondary | ICD-10-CM

## 2024-02-29 DIAGNOSIS — N184 Chronic kidney disease, stage 4 (severe): Secondary | ICD-10-CM | POA: Diagnosis not present

## 2024-02-29 DIAGNOSIS — G4733 Obstructive sleep apnea (adult) (pediatric): Secondary | ICD-10-CM | POA: Diagnosis not present

## 2024-02-29 DIAGNOSIS — E782 Mixed hyperlipidemia: Secondary | ICD-10-CM

## 2024-02-29 DIAGNOSIS — R9431 Abnormal electrocardiogram [ECG] [EKG]: Secondary | ICD-10-CM | POA: Diagnosis not present

## 2024-02-29 MED ORDER — AMLODIPINE BESYLATE 5 MG PO TABS
5.0000 mg | ORAL_TABLET | Freq: Every day | ORAL | Status: AC
Start: 2024-02-29 — End: ?

## 2024-02-29 NOTE — Patient Instructions (Addendum)
 Medication Instructions:    Your physician recommends that you continue on your current medications as directed. Please refer to the Current Medication list given to you today.  *If you need a refill on your cardiac medications before your next appointment, please call your pharmacy*   Lab Work: NONE ORDERED  TODAY    If you have labs (blood work) drawn today and your tests are completely normal, you will receive your results only by: MyChart Message (if you have MyChart) OR A paper copy in the mail If you have any lab test that is abnormal or we need to change your treatment, we will call you to review the results.    Testing/Procedures: IN  3 WEEKS   Your physician has requested that you have an echocardiogram. Echocardiography is a painless test that uses sound waves to create images of your heart. It provides your doctor with information about the size and shape of your heart and how well your heart's chambers and valves are working. This procedure takes approximately one hour. There are no restrictions for this procedure. Please do NOT wear cologne, perfume, aftershave, or lotions (deodorant is allowed). Please arrive 15 minutes prior to your appointment time.  Please note: We ask at that you not bring children with you during ultrasound (echo/ vascular) testing. Due to room size and safety concerns, children are not allowed in the ultrasound rooms during exams. Our front office staff cannot provide observation of children in our lobby area while testing is being conducted. An adult accompanying a patient to their appointment will only be allowed in the ultrasound room at the discretion of the ultrasound technician under special circumstances. We apologize for any inconvenience.   Your physician has recommended that you wear an event monitor. Event monitors are medical devices that record the heart's electrical activity. Doctors most often us  these monitors to diagnose arrhythmias.  Arrhythmias are problems with the speed or rhythm of the heartbeat. The monitor is a small, portable device. You can wear one while you do your normal daily activities. This is usually used to diagnose what is causing palpitations/syncope (passing out).    Follow-Up: At [facility], you and your health needs are our priority.  As part of our continuing mission to provide you with exceptional heart care, our providers are all part of one team.  This team includes your primary Cardiologist (physician) and Advanced Practice Providers or APPs (Physician Assistants and Nurse Practitioners) who all work together to provide you with the care you need, when you need it.  Your next appointment:     6 week(s)  Provider:   [name], MD    We recommend signing up for the patient portal called MyChart.  Sign up information is provided on this After Visit Summary.  MyChart is used to connect with patients for Virtual Visits (Telemedicine).  Patients are able to view lab/test results, encounter notes, upcoming appointments, etc.  Non-urgent messages can be sent to your provider as well.   To learn more about what you can do with MyChart, go to [email or web address].   Other Instructions  ZIO XT- Long Term Monitor Instructions  Your physician has requested you wear a ZIO patch monitor for 14 days.  This is a single patch monitor. [employer] supplies one patch monitor per enrollment. Additional stickers are not available. Please do not apply patch if you will be having a Nuclear Stress Test,  Echocardiogram, Cardiac CT, MRI, or Chest Xray during the period you  would be wearing the  monitor. The patch cannot be worn during these tests. You cannot remove and re-apply the  ZIO XT patch monitor.  Your ZIO patch monitor will be mailed 3 day USPS to your address on file. It may take 3-5 days  to receive your monitor after you have been enrolled.  Once you have received your monitor, please  review the enclosed instructions. Your monitor  has already been registered assigning a specific monitor serial # to you.  Billing and Patient Assistance Program Information  We have supplied [employer] with any of your insurance information on file for billing purposes. [employer] offers a sliding scale Patient Assistance Program for patients that do not have  insurance, or whose insurance does not completely cover the cost of the ZIO monitor.  You must apply for the Patient Assistance Program to qualify for this discounted rate.  To apply, please call [employer] at [phone], select option 4, select option 2, ask to apply for  Patient Assistance Program. Meredeth will ask your household income, and how many people  are in your household. They will quote your out-of-pocket cost based on that information.  [employer] will also be able to set up a 27-month, interest-free payment plan if needed.  Applying the monitor   Shave hair from upper left chest.  Hold abrader disc by orange tab. Rub abrader in 40 strokes over the upper left chest as  indicated in your monitor instructions.  Clean area with 4 enclosed alcohol pads. Let dry.  Apply patch as indicated in monitor instructions. Patch will be placed under collarbone on left  side of chest with arrow pointing upward.  Rub patch adhesive wings for 2 minutes. Remove white label marked 1. Remove the white  label marked 2. Rub patch adhesive wings for 2 additional minutes.  While looking in a mirror, press and release button in center of patch. A small green light will  flash 3-4 times. This will be your only indicator that the monitor has been turned on.  Do not shower for the first 24 hours. You may shower after the first 24 hours.  Press the button if you feel a symptom. You will hear a small click. Record Date, Time and  Symptom in the Patient Logbook.  When you are ready to remove the patch, follow instructions on the last 2 pages of Patient   Logbook. Stick patch monitor onto the last page of Patient Logbook.  Place Patient Logbook in the blue and white box. Use locking tab on box and tape box closed  securely. The blue and white box has prepaid postage on it. Please place it in the mailbox as  soon as possible. Your physician should have your test results approximately 7 days after the  monitor has been mailed back to [facility].  Call [facility] Customer Care at [phone] if you have questions regarding  your ZIO XT patch monitor. Call them immediately if you see an orange light blinking on your  monitor.  If your monitor falls off in less than 4 days, contact our Monitor department at [phone].  If your monitor becomes loose or falls off after 4 days call [employer] at [phone] for  suggestions on securing your monitor

## 2024-02-29 NOTE — Progress Notes (Unsigned)
Enrolled patient for a 14 day Zio XT monitor to be mailed to patients home  [place] to read

## 2024-03-01 ENCOUNTER — Telehealth: Payer: Self-pay

## 2024-03-01 ENCOUNTER — Other Ambulatory Visit: Payer: Self-pay

## 2024-03-01 NOTE — Telephone Encounter (Unsigned)
 Copied from CRM [id number]. Topic: Clinical - Medication Question >> Mar 01, 2024 12:20 PM Santiya F wrote: Reason for CRM: Patient is calling in because she is requesting to speak with Claris about a new prescription for a Libre Plus 3. She is requesting it be sent to [facility] on [address] in [place].

## 2024-03-02 ENCOUNTER — Other Ambulatory Visit: Payer: Self-pay

## 2024-03-02 MED ORDER — FREESTYLE LIBRE 3 PLUS SENSOR MISC: Status: DC

## 2024-03-02 MED ORDER — FREESTYLE LIBRE 3 PLUS SENSOR MISC: 11 refills | Status: AC

## 2024-03-02 NOTE — Telephone Encounter (Signed)
 Pt advised. Rx sent. Concerned will need PA as she continues to run into issues with getting filled and would like sample. 2 monitors placed at front desk. Advised if she is able to have them filled by pharmacy to let us  know

## 2024-03-05 ENCOUNTER — Encounter

## 2024-03-07 ENCOUNTER — Encounter

## 2024-03-08 ENCOUNTER — Encounter: Payer: Self-pay | Admitting: Oncology

## 2024-03-08 ENCOUNTER — Ambulatory Visit: Admitting: Allergy & Immunology

## 2024-03-09 ENCOUNTER — Ambulatory Visit (INDEPENDENT_AMBULATORY_CARE_PROVIDER_SITE_OTHER): Admitting: Clinical

## 2024-03-09 DIAGNOSIS — F411 Generalized anxiety disorder: Secondary | ICD-10-CM | POA: Diagnosis not present

## 2024-03-09 DIAGNOSIS — F330 Major depressive disorder, recurrent, mild: Secondary | ICD-10-CM | POA: Diagnosis not present

## 2024-03-09 NOTE — Progress Notes (Signed)
   Darice Seats, LCSW

## 2024-03-09 NOTE — Progress Notes (Signed)
 [facility] Counselor/Therapist Progress Note  Patient ID: Abigail Figueroa, MRN: [id number],    Date: 03/09/2024  Time Spent: 12:37pm - 1:02pm : 25 minutes  Treatment Type: Individual Therapy  Reported Symptoms: none reported  Mental Status Exam: Appearance:  Neat and Well Groomed     Behavior: Appropriate  Motor: Normal  Speech/Language:  Clear and Coherent and Normal Rate  Affect: Appropriate  Mood: normal  Thought process: normal  Thought content:   WNL  Sensory/Perceptual disturbances:   WNL  Orientation: oriented to person, place, time/date, and situation  Attention: Good  Concentration: Good  Memory: WNL  Fund of knowledge:  Good  Insight:   Good  Judgment:  Good  Impulse Control: Good   Risk Assessment: Danger to Self:  No Patient denied current suicidal ideation  Self-injurious Behavior: No Danger to Others: No Patient denied current homicidal ideation Duty to Warn:no Physical Aggression / Violence:No  Access to Firearms a concern: No  Gang Involvement:No   Subjective: Patient stated, been going pretty good, hanging in there, been dealing with the same as usual trying to get this thing together with my brother.  Patient reported recent conflict regarding a financial decision at patient's church. Patient stated, I just spoke out how I felt about it through the text in response to conflict and patient stated, my brother didn't like that in reference to patient's response to conflict. Patient reported patient's brother has not contacted patient since the conflict occurred. Patient reported patient feels brother is waiting for patient to call brother. Patient stated, I will not call him. Patient stated, I'm through with it in reference to patient's involvement in the group in which conflict occurred. Patient stated, I'm through trying to make things work with me and my brother, I'm just tired of it all in reference to conflict with brother.  Patient stated, Its pretty good in response to current mood. Patient requested to end today's session at 1 pm.    Interventions: Cognitive Behavioral Therapy. Clinician conducted session via caregility video from clinician's home office. Patient provided verbal consent to proceed with telehealth session and is aware of limitations of telephone or video visits. Patient participated in session from patient's home. Reviewed events since last session and assessed for changes. Clarified patient's statement at the beginning of session regarding patient's brother. Discussed recent conflict and patient's response. Assisted patient in generating alternatives regarding recent conflict. Reviewed cost/benefit analysis.    Collaboration of Care: not required at this time   Diagnosis:  Mild episode of recurrent major depressive disorder (HCC)    Generalized anxiety disorder   Plan: Patient is to utilize [employer] Therapy, thought re-framing, relaxation techniques, mindfulness, effective communication, and coping strategies to decrease symptoms associated with Major Depressive Disorder and Generalized Anxiety Disorder. Frequency: bi-weekly  Modality: individual      Long-term goal:   Patient stated, I want to see a change in the anxiety.      Reduce overall level, frequency, and intensity of the symptoms of anxiety and depression as evidenced by decreased sadness, feelings of anxiety, feeling lost, lack of energy, difficulty falling and staying asleep, changes in concentration, psychomotor retardation, rapid heart rate, excessive worry, restlessness, feeling jittery, and feeling on edge from 5 to 6 days per week to 0 to 1 days per week per patient reported for at least 3 consecutive months.   Target Date: 09/11/24  Progress: progressing    Short-term goal:  Develop coping strategies to utilize in response  to symptoms of depression/anxiety and stressors  Target Date: 09/11/24  Progress:  progressing    Verbally express patient's thoughts and feelings to others and utilize effective communication strategies when expressing patient's thoughts/feelings    Target Date: 09/11/24  Progress: progressing    Verbalize an understanding of the relationship between symptoms of anxiety and the impact on patient's thought patterns and behaviors    Target Date: 09/11/24  Progress: progressing    Develop strategies for patient to utilize to support patient's daughter and grandson as it relates to grandson using illegal substances.  Target Date: 09/11/24  Progress: progressing    Develop and implement coping strategies for patient to utilize in response to grandson using illegal substances Target Date: 09/11/24  Progress: progressing     Abigail Seats, LCSW

## 2024-03-12 ENCOUNTER — Encounter

## 2024-03-14 ENCOUNTER — Encounter

## 2024-03-14 ENCOUNTER — Ambulatory Visit

## 2024-03-14 NOTE — Progress Notes (Signed)
   03/14/2024 Name: Abigail Figueroa MRN: [id number] DOB: 01-Nov-1960  Chief Complaint  Patient presents with   Medication Assistance    Husband presented to clinic for assistance in linking up [place] phone application with clinic account. Linked account to [employer] and educated on reports in the application.    Clinical team is currently working to obtain [employer] 3+ via insurance d/t recurrent hypoglycemia (level 1 and 2 events), while only on Ozempic  0.5 mg weekly. Provided additional sample in the interim. All questions and concerns addressed.  Sherissa Tenenbaum E. Marsh, PharmD, CPP Clinical Pharmacist [facility] [phone]

## 2024-03-15 ENCOUNTER — Encounter: Payer: Self-pay | Admitting: Oncology

## 2024-03-15 ENCOUNTER — Telehealth: Payer: Self-pay

## 2024-03-15 ENCOUNTER — Other Ambulatory Visit (HOSPITAL_COMMUNITY): Payer: Self-pay

## 2024-03-15 NOTE — Telephone Encounter (Unsigned)
 Copied from CRM #[id number]. Topic: Clinical - Medication Prior Auth >> Mar 15, 2024  1:16 PM Tobias L wrote: Reason for CRM: Layne with covermymeds calling to assist with prior authorization for freestyle libre sensors. Can submit prior authorization through covermymeds  Best callback number: [phone]

## 2024-03-15 NOTE — Telephone Encounter (Signed)
 PA request has been Received. New Encounter has been or will be created for follow up. For additional info see Pharmacy Prior Auth telephone encounter from 03/16/24.

## 2024-03-16 ENCOUNTER — Telehealth: Payer: Self-pay | Admitting: Pharmacy Technician

## 2024-03-16 NOTE — Telephone Encounter (Signed)
 Pharmacy Patient Advocate Encounter   Received notification from Pt Calls Messages that prior authorization for Freestyle Libre 3+ is required/requested.   Insurance verification completed.   The patient is insured through [facility].   Per test claim: PA required; PA started via CoverMyMeds. KEY [id number] . Please see clinical question(s) below that I am not finding the answer to in their chart and advise.  Are there medical records (for example, chart notes, laboratory values) documenting ONE of the following: (1) Patient is being treated with insulin, (2) Patient has a history of problematic hypoglycemia with documentation of recurrent level 2 hypoglycemic events (glucose less than 54 mg/dl [6.9 mmol/L]) that persist despite multiple attempts to adjust medication(s) and/or modify the diabetes treatment plan, (3) Patient has a history of problematic hypoglycemia with documentation of a history of a level 3 hypoglycemic event (glucose less than 54mg /dL [6.9ffno/O]) characterized by altered mental and/or physical state requiring third-party assistance for treatment of hypoglycemia? Please note, submission of medical records is required.   Allyson, based on your note, I think we may be able to get her qualified based on this second option in blue, but I need the documentation of the events. Can you do a download of her GCM and send it to me or save it to her chart for me to attach to the PA request? If we don't have the documentation it will be denied.

## 2024-03-16 NOTE — Telephone Encounter (Signed)
 Forms received via fax. Placed on sorter to be faxed Monday along with supporting information

## 2024-03-19 ENCOUNTER — Other Ambulatory Visit (HOSPITAL_COMMUNITY): Payer: Self-pay

## 2024-03-19 NOTE — Telephone Encounter (Signed)
 Pharmacy Patient Advocate Encounter  Received notification from [employer] that Prior Authorization for [facility] 3+ has been responded to as NA-We have a prior authorization request pending review for the requested drug for this member.   PA #/Case ID/Reference #: [id number]  Called the # previously given in

## 2024-03-19 NOTE — Telephone Encounter (Signed)
 Called [employer] back. Denied PA was for the Freestyle Libre Kit and initiated by the provider on [email or web address]. Not sure who initiated the PA, but it will have to be appealed. Full denial letter is in the pt's media tab. They are saying it is denied due to not meeting criteria. You should be able to use the criteria I sent for the PA I submitted to get this denial overturned.

## 2024-03-19 NOTE — Progress Notes (Signed)
 Abigail Figueroa                                          MRN: [id number]   03/19/2024   The [facility]BCI Quality Team Specialist reviewed this patient medical record for the purposes of chart review for care gap closure. The following were reviewed: abstraction for care gap closure-glycemic status assessment.    VBCI Quality Team

## 2024-03-19 NOTE — Telephone Encounter (Signed)
 Abigail Figueroa has been working on this as patient has significant hypoglycemia episodes recorded.  We did discuss with the patient that she may need to discuss with her endocrinologist. [name] to leave this for Ruch on her return.

## 2024-03-19 NOTE — Telephone Encounter (Signed)
 Called the pt's ins. They are saying there is a PA already denied, that it was just denied this morning. [id number]- waiting on denial letter. We have to do an appeal in order to move forward, or wait 65 days to resubmit a new request. Once I receive the denial letter I will see what the denial reason is and go from there to see if we have the appropriate documentation for approval.

## 2024-03-19 NOTE — Telephone Encounter (Signed)
 Received CGM download and attached to PA request.  Clinical questions have been answered and PA submitted. PA currently Pending.

## 2024-03-20 ENCOUNTER — Ambulatory Visit (HOSPITAL_COMMUNITY)

## 2024-03-21 ENCOUNTER — Ambulatory Visit (HOSPITAL_COMMUNITY)
Admission: RE | Admit: 2024-03-21 | Discharge: 2024-03-21 | Disposition: A | Discharge status: Home / Self Care | Source: Ambulatory Visit | Attending: Cardiology | Admitting: Cardiology

## 2024-03-21 DIAGNOSIS — R9431 Abnormal electrocardiogram [ECG] [EKG]: Secondary | ICD-10-CM | POA: Insufficient documentation

## 2024-03-21 LAB — ECHOCARDIOGRAM COMPLETE: S' Lateral: 3 cm

## 2024-03-22 NOTE — Telephone Encounter (Signed)
 Patient insurance ask to send supporting documentation showing  patient has a hx of problematic hypoglycemia with recurrent (more than one) level 2 hypoglycemic events [glucose less than 54 mg/dL (6.9ffno/O)] that persist despite multiple (more than one) attempts to adjust medication (s) and /or modify the diabetes treatment plan [submission of medical records (for example: chart notes, laborayory values) is required]   I advised patient her OV notes from 09/15/23 with Janna Ostwalt, PA-C were submitted along with her poct glucose on 09/27/23 and 90 day daily log from her herlene but what I have noticed is that no attempt to try differnet forms of treatment has been made since noticing her hypoglycemic episodes. Patient verbalized understanding. I advised patient to follow-up with endocrinology as they are managing her diabetes. She verbalized understanding.   FYI to rx team - No further attempts to have rx approved will be submitted by [facility]

## 2024-03-23 ENCOUNTER — Ambulatory Visit: Admitting: Clinical

## 2024-03-23 DIAGNOSIS — F330 Major depressive disorder, recurrent, mild: Secondary | ICD-10-CM

## 2024-03-23 DIAGNOSIS — F411 Generalized anxiety disorder: Secondary | ICD-10-CM

## 2024-03-23 NOTE — Progress Notes (Signed)
   Darice Seats, LCSW

## 2024-03-23 NOTE — Progress Notes (Signed)
 [facility] Counselor/Therapist Progress Note  Patient ID: Abigail Figueroa, MRN: [id number],    Date: 03/23/2024  Time Spent: 12:35pm - 1:25pm : 50 minutes   Treatment Type: Individual Therapy  Reported Symptoms: headache  Mental Status Exam: Appearance:  Neat and Well Groomed     Behavior: Appropriate  Motor: Normal  Speech/Language:  Clear and Coherent and Normal Rate  Affect: Appropriate  Mood: normal  Thought process: normal  Thought content:   WNL  Sensory/Perceptual disturbances:   Headache   Orientation: oriented to person, place, time/date, and situation  Attention: Good  Concentration: Good  Memory: WNL  Fund of knowledge:  Good  Insight:   Good  Judgment:  Good  Impulse Control: Good   Risk Assessment: Danger to Self:  No Patient denied current suicidal ideation  Self-injurious Behavior: No Danger to Others: No Patient denied current homicidal ideation Duty to Warn:no Physical Aggression / Violence:No  Access to Firearms a concern: No  Gang Involvement:No   Subjective: Patient stated, they've been going pretty good in response to events since last session. Patient stated, trying to get ready for thanksgiving, trying to get decorations together. Patient stated, I look forward to it, that's a happy moment in reference to decorating. Patient reported patient was required to wear a heart monitor recently due to concerns observed at cardiology appointment. Patient stated, I need to learn to let a lot of things go, stress wise. Patient reported patient received a call from patient's brother and stated, he talked like nothing had happened, like everything was alright. Patient reported I was fine in response to brother's phone call. Patient stated, the stress I feel, its just been too much. Patient reported patient has not been attending faith based activities due to stressors. Patient stated, I guess I need a break. Patient reported feeling  patient needs to let go of situation regarding patient's brother. Patient stated, I need to let go of things I can't change. During session, patient acknowledged patient does not have facts/evidence to support assumptions regarding patient's brother. Patient stated, I need him (brother) to come to me and tell me the truth. Patient stated, Its time for me to let it go in reference to situation regarding patient's brother. Patient stated, its been pretty good in response to mood since last session.  Interventions: Cognitive Behavioral Therapy. Clinician conducted session via caregility video from clinician's home office. Patient provided verbal consent to proceed with telehealth session and is aware of limitations of telephone or video visits. Patient participated in session from patient's home. Reviewed events since last session and assessed for changes. Reviewed positive events since last session. Discussed phone call with brother and patient's response. Explored meaning of patient's statement, I need to learn to let a lot of things go.  Discussed patient's concerns related to the impact of stressors. Challenged statements to assist patient in reframing thoughts/situation. Assisted patient in challenging thoughts and identifying evidence for/against thoughts. Assisted patient in generating alternatives to situation regarding brother. Provided psycho education related to cognitive distortions/unhelpful thinking styles and challenging thoughts. Explored strategies to let go of situation regarding brother. Reviewed cost/benefit analysis.   Collaboration of Care: not required at this time   Diagnosis:  Mild episode of recurrent major depressive disorder (HCC)    Generalized anxiety disorder   Plan: Patient is to utilize [employer] Therapy, thought re-framing, relaxation techniques, mindfulness, effective communication, and coping strategies to decrease symptoms associated with Major  Depressive Disorder and Generalized Anxiety Disorder.  Frequency: bi-weekly  Modality: individual      Long-term goal:   Patient stated, I want to see a change in the anxiety.      Reduce overall level, frequency, and intensity of the symptoms of anxiety and depression as evidenced by decreased sadness, feelings of anxiety, feeling lost, lack of energy, difficulty falling and staying asleep, changes in concentration, psychomotor retardation, rapid heart rate, excessive worry, restlessness, feeling jittery, and feeling on edge from 5 to 6 days per week to 0 to 1 days per week per patient reported for at least 3 consecutive months.   Target Date: 09/11/24  Progress: progressing    Short-term goal:  Develop coping strategies to utilize in response to symptoms of depression/anxiety and stressors  Target Date: 09/11/24  Progress: progressing    Verbally express patient's thoughts and feelings to others and utilize effective communication strategies when expressing patient's thoughts/feelings    Target Date: 09/11/24  Progress: progressing    Verbalize an understanding of the relationship between symptoms of anxiety and the impact on patient's thought patterns and behaviors    Target Date: 09/11/24  Progress: progressing    Develop strategies for patient to utilize to support patient's daughter and grandson as it relates to grandson using illegal substances.  Target Date: 09/11/24  Progress: progressing    Develop and implement coping strategies for patient to utilize in response to grandson using illegal substances Target Date: 09/11/24  Progress: progressing     Darice Seats, LCSW

## 2024-03-24 ENCOUNTER — Other Ambulatory Visit: Payer: Self-pay | Admitting: Family Medicine

## 2024-03-24 DIAGNOSIS — J302 Other seasonal allergic rhinitis: Secondary | ICD-10-CM

## 2024-03-25 DIAGNOSIS — R002 Palpitations: Secondary | ICD-10-CM

## 2024-03-27 ENCOUNTER — Ambulatory Visit: Admitting: Allergy & Immunology

## 2024-04-01 ENCOUNTER — Other Ambulatory Visit: Payer: Self-pay | Admitting: Allergy & Immunology

## 2024-04-01 NOTE — Progress Notes (Addendum)
 "  Outpatient Neurology Consult Note   [facility] [address] [place] [address]  Date: 04/13/2024 Patient Name: Abigail Figueroa MRN: [id number] PCP: [facility], MD   Assessment and Plan      Abigail Figueroa is a 63 y.o. female PMH anemia, arthritis, anxiety, depression, BMI 33, HTN, HLD, T2DM, CKD4, CHF, gout, OSA,  headaches, history of seizures, Bell's palsy, pre-syncope, BL adrenal adenomas, right optic neuropathy, presenting in consultation for evaluation of tremor.    #Parkinson's Disease, tremor predominant Patient endorses progressive intermittent bilateral hand tremor present since 2023.  On exam has re-emergent hand tremor L>R and left leg tremor, jaw tremor, postural instability, rigidity, masked facies consistent with Parkinson disease.  Has additional features of REM sleep behavior disorder, severe constipation and memory loss.  Has had minimal response to Sinemet and uptitration has only been occurring once every 6 months due to patient factors.  Her persistent tremor is now affecting her quality of life as she avoids social activities out of embarrassment.  Plan to trial off Sinemet for a week to see if tremor is worse and determine if she has had any benefit from the medication.  Would next consider trial of additional medications, noting patient already has polypharmacy.  Would then consider DBS or FUS for medically refractory Parkinson's tremor. - Trial off Sinemet 25-100mg  for 1 week.  If tremor worsens, restart current dosing for 1 week then uptitrate by half a pill 4 times daily weekly for the next 2 weeks to 2 pills QID. - If Sinemet uptitration is not effective, consider trial of pramipexole or ropinirole - If tremor is deemed medically refractory, consider DBS or FUS.  She will follow-up in DBS clinic with Dr. Curlene. - Continue bowel regimen    #Memory  impairment Endorses memory decline.  Difficulty with appointments, dates, names.  Independent with ADL's.  Likely multi-factorial from poor sleep, polypharmacy, chronic cerebral microvascular disease, and synucleinopathy. - Neuropsychologic testing ordered previously, patient to request an appointment - Wear CPAP at night - Melatonin at night  Return Visit in:  Return in about 6 months (around 10/12/2024). In DBS clinic with Dr. Curlene.    Patient was seen and discussed with Dr.Roque who agrees with assessment and plan.  Comer Gleason MD Resident Physician [facility] Department of Neurology     ATTESTATION NOTE: I saw and evaluated the patient, participating in the key portions of the service.  I reviewed the residents note and agree with the residents findings and plan as documented in their note. Toribio DELENA Curlene, MD       HPI      HPI 12/2022: Abigail Figueroa is a 63 y.o.  female PMH anemia, arthritis, anxiety, depression, BMI 33, HTN, HLD, T2DM, CKD4, CHF, gout, OSA,  headaches, history of seizures, Bell's palsy, pre-syncope, BL adrenal adenomas, right optic neuropathy, seen in consultation at the [facility] at the request of Dr. Teresia for evaluation of tremor.     Accompanied by husband, who also provides history.  Saw [facility] in 2015 for tremor, patient endorsed tremor present since 2014.  Reportedly a medication side effect per patient, stopped when she stopped the offending unknown medication.  More recently saw [facility] 10/2022 for tremor.  Here for a second opinion.  Per [facility] note, intermittent bilateral hand tremors for the past 1 and half years, she first noticed  it around January 2023. Her tremor is noticeable particularly when she holds something or passes something with her hands to someone else or when she writes. She has no other strong family history of tremors, 1 uncle had a tremor.  She quit smoking in her early  30s, she does not drink any alcohol, she stopped drinking caffeine since November 2023. She is on as needed Thorazine , but reports that she has not taken it in a long time as it is sedating for her. It is prescribed by her migraine specialist. She has recently started Prozac about a month and a half ago and stopped Viibryd  at the time [also on lamictal ]. This is through her psychiatrist. Her migraine specialist also changed her from baclofen  to Parafon prn within the last year.   Patient states she has intermittent tremor of BL hands, tongue, head, inside of her body.  Worse in frequency and intensity over time.  It's embarrassing to her in public.  Tremor is bad with handwriting, holding a utensil, passing objects.   Often losing her balance.  Had one fall going up the porch steps about 3 months ago.  Intact sense of smell.  Rare orthostasis.  For a few years while sleeping, talks and moves a few times a week.  Memory decline, forgets names, birthdays, appointments since 2023.                Pt reports hx of seizures that began in her twenties, around the time of nervous breakdown.   Sometimes provoked after being hysterical.  She reports that with her first event she passed out without warning and had the shakes.  She states that she had up to 20 other similar events at the time and was seeing a neurologist at [facility] at the time and was reportedly told that she had a small abnormality on the brain and was told that she needed lifelong medication.  Has been on Tegretol  since her twenties.  PCP manages her seizure medication.    She follows with Dr. Oneita for migraines [facility]  Follows with cardiology, nephrology, endocrinology, PCP.  Interval summary through 10/2023: - Accompanied by husband at every visit - Tremor still bothersome per husband, but patient does not like to discuss her tremor.  She uses her right hand to suppress the tremor in her left hand.  Prevents her from  enjoying her social life out of embarrassment.  Her whole body and head will also shake.  They cannot state if there are certain times of day when tremor is worse.  Previously tried increasing afternoon and evening doses as patient thought maybe tremor was worse in the afternoon, but this did not help.  They have not messaged between appointments as discussed for medication adjustments and medication titrations have happened slowly once every 6 months. - At last visit 10/2023, increased Sinemet 25-100mg  QID to 1 pill 8AM, 1 pill noon, 1.5 pills 5PM, 1.5 pills 9PM - no falls, not needing cane or walker at home - No family history of tremor - severe constipation is better after increasing bowel regimen - started PT/OT with [facility] in July 2025 but they weren't PD specialists and driving to [facility] is too far - PD social worker provided list of resources in Jan 2025 - sees a therapist, sports and child psychotherapist weekly for chronic issues  UNIFIED PARKINSON'S DISEASE RATING SCALE (UPDRS)  I. MENTATION, BEHAVIOR AND MOOD  04/20/23 1. Intellectual Impairment  1 2. Thought Disorder (Due  to dementia or drug intoxication) 0 3. Depression 2 4. Motivation/Initiative 0  II. ACTIVITIES OF DAILY LIVING (for both on and off)  04/20/23 5. Speech 0 6. Salivation 0 7. Swallowing 0 8. Handwriting 4 off, 2 on 9. Cutting food and handling utensils 4 off, 2 on 10. Dressing 0 11. Hygiene 4 off, 2 on 12. Turning in bed and adjusting bed clothes 0 13. Falling (unrelated to freezing) 0 14. Freezing when walking 1 15. Walking 0 16. Tremor (Symptomatic complaint of tremor in any part of body) 4 on, 2 off 17. Sensory complaints related to parkinsonism 0  III. MOTOR EXAMINATION  04/20/23 18. Speech 1 19. Facial Expression 2 20. Tremor at rest (head, upper and lower extremities) 1  21. Action or Postural Tremor of hands 2 22.Rigidity (Judged on passive movement of major joints with patient relaxed  in sitting position. Cogwheeling to be ignored.) 1 23. Finger Taps (Patient taps thumb with index finger in rapid succession.) 2 24. Hand Movements 0 25. Rapid Alternating Movements of Hands (Pronation-supination movements of hands, vertically and horizontally, with as large an amplitude as possible, both hands simultaneously.) 1 26. Leg Agility (Patient taps heel on the ground in rapid succession picking up entire leg. Amplitude should be at least 3 inches.) 1 27. Arising from Chair (Patient attempts to rise from a straightbacked chair, with arms folded across chest.)  0 28. Posture 1 29. Gait 1 30. Postural Stability (Response to sudden, strong posterior displacement produced by pull on shoulders while patient erect with eyes open and feet slightly apart. Patient is prepared) 4 31. Body Bradykinesia and Hypokinesia (Combining slowness, hesitancy, decreased armswing, small amplitude, and poverty of movement in general.) 2  IV COMPLICATIONS OF THERAPY (In the past week)  04/20/23 A. DYSKINESIAS 32. Duration: What proportion of the waking day are dyskinesias present? (Historical information.) 0  0 = None. 1 = 1-25% of day. 2 = 26-50% of day.  3 = 51-75% of day.  4 = 76-100% of day. 33. Disability: How disabling are the dyskinesias?  0 0 = Not disabling. 1 = Mildly disabling.  2 = Moderately disabling.  3 = Severely disabling.  4 = Completely disabled. 34. Painful Dyskinesias: How painful are the dyskinesias? 0 0 = No painful dyskinesias.  1 = Slight.  2 = Moderate.  3 = Severe.  4 = Marked. 35. Presence of Early Morning Dystonia (Historical information.)  0 = No 1 = Yes B. CLINICAL FLUCTUATIONS 36. Are off periods predictable? yes 0 = No 1 = Yes 37. Are off periods unpredictable? yes 0 = No 1 = Yes 38. Do off periods come on suddenly, within a few seconds? no 0 = No 1 = Yes 39. What proportion of the waking day is the patient off on average? 1 0 = None 1 = 1-25%  of day. 2 = 26-50% of day.  3 = 51-75% of day.  4 = 76-100% of day. C. OTHER COMPLICATIONS 40. Does the patient have anorexia, nausea, or vomiting? no 0 = No 1 = Yes 41. Any sleep disturbances, such as insomnia or hypersomnolence? yes (chronic) 0 = No 1 = Yes 42. Does the patient have symptomatic orthostasis? (Record the patient's blood pressure, height and weight on the scoring form) Yes, a few times a week 0 = No 1 = Yes  V. MODIFIED HOEHN AND YAHR STAGING: stage 3 04/20/23 STAGE 0 = No signs of disease.  STAGE 1 = Unilateral disease.  STAGE  1.5 = Unilateral plus axial involvement.  STAGE 2 = Bilateral disease, without impairment of balance.  STAGE 2.5 = Mild bilateral disease, with recovery on pull test.  STAGE 3 = Mild to moderate bilateral disease; some postural instability; physically independent.  STAGE 4 = Severe disability; still able to walk or stand unassisted.  STAGE 5 = Wheelchair bound or bedridden unless aided.  VI. [place] ACTIVITIES OF DAILY LIVING SCALE:  70 % 04/20/23 100% = Completely independent. Able to do all chores without slowness, difficulty or impairment. Essentially normal. Unaware of any difficulty. 90% = Completely independent. Able to do all chores with some degree of slowness, difficulty and impairment. Might take twice as long. Beginning to be aware of difficulty. 80% = Completely independent in most chores. Takes twice as long. Conscious of difficulty and slowness. 70% = Not completely independent. More difficulty with some chores. Three to four times as long in some. Must spend a large part of the day with chores. 60% = Some dependency. Can do most chores, but exceedingly slowly and with much effort. Errors; some impossible. 50% = More dependent. Help with half, slower, etc. Difficulty with everything. 40% = Very dependent. Can assist with all chores, but few alone. 30% = With effort, now and then does a few chores alone or begins  alone. Much help needed. 20% = Nothing alone. Can be a slight help with some chores. Severe invalid. 10% = Totally dependent, helpless. Complete invalid. 0% = Vegetative functions such as swallowing, bladder and bowel functions are not functioning. Bedridden.   Allergies  Allergen Reactions   Aimovig Autoinjector [Erenumab-Aooe] Shortness Of Breath and Swelling   Morphine Rash, Swelling, Anxiety, Hives, Itching, Nausea And Vomiting and Other (See Comments)    Nausea and vomiting    Nausea and vomiting  Nausea and vomiting    Nausea and vomiting  REACTION: swelling  REACTION: swelling  REACTION: swelling  REACTION: swelling  Nausea and vomiting    Nausea and vomiting  Nausea and vomiting    Nausea and vomiting  REACTION: swelling  Nausea and vomiting    Nausea and vomiting   Opioids - Morphine Analogues Hives    Nausea and vomiting   Oxycodone  Hives, Itching, Rash, Swelling and Nausea And Vomiting   Morphine Sulfate Itching and Nausea And Vomiting    REACTION: swelling     Current Outpatient Medications  Medication Sig Dispense Refill   amLODIPine  (NORVASC ) 5 MG tablet Take 1 tablet (5 mg total) by mouth daily.     aripiprazole (ABILIFY) 10 MG tablet Take 1 tablet (10 mg total) by mouth every evening.     azelastine  (ASTELIN ) 137 mcg (0.1 %) nasal spray 1 spray into each nostril two (2) times a day. Use in each nostril as directed (Patient taking differently: 1 spray into each nostril continuous as needed. Use in each nostril as directed)     bisacodyl  (DULCOLAX) 5 mg EC tablet Take 2 tablets (10 mg total) by mouth Take as directed. Take 2 tablets (10 mg total) as directed for bowel prep. (Patient taking differently: Take 2 tablets (10 mg total) by mouth daily as needed. Take 2 tablets (10 mg total) as directed for bowel prep.) 2 tablet 0   busPIRone  (BUSPAR ) 15 MG tablet Take 1 tablet (15 mg total) by mouth in the morning.     carBAMazepine  (TEGRETOL   XR) 400  MG 12 hr tablet      carbidopa-levodopa (SINEMET) 25-100 mg per tablet Take  1 pill 8AM, 1 pill noon, 1.5 pill 5PM, 1.5 pill 9PM 450 tablet 3   chlorproMAZINE  (THORAZINE ) 25 MG tablet chlorpromazine  25 mg tablet  TAKE 1 TO 2 TAB BY MOUTH AS NEEDED FOR HEADACHE. MAY REPEAT IN 2 HOURS. DONOT EXCEED 4-6 TABS DAILY.     chlorzoxazone (PARAFON FORTE) 500 mg tablet Take 1 tablet (500 mg total) by mouth two (2) times a day as needed for muscle spasms. Twice a day as needed     ergocalciferol-1,250 mcg, 50,000 unit, (DRISDOL) 1,250 mcg (50,000 unit) capsule Take 1 capsule (1,250 mcg total) by mouth once a week. 12 capsule 0   ezetimibe  (ZETIA ) 10 mg tablet Take 1 tablet (10 mg total) by mouth daily.     FLUoxetine (PROZAC) 10 MG capsule Take 1 capsule (10 mg total) by mouth daily.     fluticasone  (FLONASE ) 50 mcg/actuation nasal spray 1 spray into each nostril.     galcanezumab-gnlm (EMGALITY SYRINGE) 120 mg/mL Syrg Inject 120 mg under the skin every twenty-eight (28) days.     lamoTRIgine  (LAMICTAL ) 100 MG tablet Take 1 tablet (100 mg total) by mouth two (2) times a day.  0   metoPROLOL  tartrate (LOPRESSOR ) 100 MG tablet Take 1 tablet (100 mg total) by mouth two (2) times a day.     montelukast  (SINGULAIR ) 10 mg tablet Take 1 tablet (10 mg total) by mouth nightly.     polyethylene glycol (CLEARLAX) 17 gram/dose powder Take as directed for extended bowel prep. 238 g 0   promethazine  (PHENERGAN ) 25 MG tablet      rosuvastatin  (CRESTOR ) 20 MG tablet Take 1 tablet (20 mg total) by mouth daily.     semaglutide  (OZEMPIC ) 0.25 mg or 0.5 mg (2 mg/3 mL) PnIj Inject 0.5 mg under the skin every seven (7) days. 9 mL 3   tretinoin (RETIN-A) 0.025 % cream APPLY A PEA-SIZED AMOUNT TO FACE, AVOIDING EYELIDS. START EVERY OTHER NIGHT AND INCREASE TO NIGHTLY AS TOLERATED. 45 g 0   triamcinolone  (KENALOG ) 0.1 % paste Apply to affected areas in mouth twice daily as needed for irritation. Max use 2 weeks. 5 g 2    aripiprazole (ABILIFY) 5 MG tablet Take 1 tablet (5 mg total) by mouth every evening. (Patient not taking: Reported on 04/13/2024)     blood sugar diagnostic Strp      botulinum toxin Type A (BOTOX) 100 unit SolR injection Inject into the muscle.     EPINEPHrine  0.3 mg/0.3 mL Syrg Inject as directed.     fluocinonide  (LIDEX ) 0.05 % ointment fluocinonide  0.05 % topical ointment  APPLY 1 APPLICATION TOPICALLY 2 (TWO) TIMES DAILY. (Patient taking differently: once as needed.)     ONETOUCH DELICA PLUS LANCET 33 gauge Misc TEST FASTING EACH MORNING AND 2 HOURS BEFORE SUPPER. RETEST IF HAVING HYPOGLYCEMIC SYMPTOMS.     No current facility-administered medications for this visit.    Past Medical History:  Diagnosis Date   Diabetes mellitus (CMS-HCC)    Hypertension    Kidney disease    Migraine    Parkinson disease    (CMS-HCC)     No past surgical history on file.  Social History   Socioeconomic History   Marital status: Married  Tobacco Use   Smoking status: Former    Types: Cigarettes   Smokeless tobacco: Never  Vaping Use   Vaping status: Never Used  Substance and Sexual Activity   Alcohol use: No   Drug use: Never  Other Topics Concern  Do you use sunscreen? No   Tanning bed use? No   Are you easily burned? No   Excessive sun exposure? No   Blistering sunburns? No   Social Drivers of Health   Food Insecurity: No Food Insecurity (12/14/2023)   Received [facility]   Hunger Vital Sign    Within the past 12 months, you worried that your food would run out before you got the money to buy more.: Never true    Within the past 12 months, the food you bought just didn't last and you didn't have money to get more.: Never true  Tobacco Use: Medium Risk (03/02/2024)   Patient History    Smoking Tobacco Use: Former    Smokeless Tobacco Use: Never  Transportation Needs: No Transportation Needs (12/14/2023)   Received from [facility]  - Transportation    In the past 12 months, has lack of transportation kept you from medical appointments or from getting medications?: No    In the past 12 months, has lack of transportation kept you from meetings, work, or from getting things needed for daily living?: No  Physical Activity: Insufficiently Active (12/14/2023)   Received from [facility]   Exercise Vital Sign    On average, how many days per week do you engage in moderate to strenuous exercise (like a brisk walk)?: 3 days    On average, how many minutes do you engage in exercise at this level?: 20 min  Utilities: Not At Risk (12/14/2023)   Received from [facility] Utilities    In the past 12 months has the electric, gas, oil, or water  company threatened to shut off services in your home?: No  Stress: No Stress Concern Present (12/14/2023)   Received from [facility] - Occupational Stress Questionnaire    Do you feel stress - tense, restless, nervous, or anxious, or unable to sleep at night because your mind is troubled all the time - these days?: Not at all  Social Connections: Socially Integrated (12/14/2023)   Received from [facility]   Social Connection and Isolation Panel    In a typical week, how many times do you talk on the phone with family, friends, or neighbors?: More than three times a week    How often do you get together with friends or relatives?: Twice a week    How often do you attend church or religious services?: More than 4 times per year    Do you belong to any clubs or organizations such as church groups, unions, fraternal or athletic groups, or school groups?: Yes    How often do you attend meetings of the clubs or organizations you belong to?: More than 4 times per year    Are you married, widowed, divorced, separated, never married, or living with a partner?: Married  Physicist, Medical Strain: Low Risk (12/14/2023)   Received from [facility]    Overall Financial Resource Strain (CARDIA)    How hard is it for you to pay for the very basics like food, housing, medical care, and heating?: Not very hard  Health Literacy: Adequate Health Literacy (12/14/2023)   Received from [facility]   B1300 Health Literacy    How often do you need to have someone help you when you read instructions, pamphlets, or other written material from your doctor or pharmacy?: Never    Family History  Problem Relation Age of Onset  Sarcoidosis Mother    Diabetes Brother    Melanoma Neg Hx    Squamous cell carcinoma Neg Hx    Basal cell carcinoma Neg Hx    Blindness Neg Hx    Cataracts Neg Hx    Glaucoma Neg Hx    Macular degeneration Neg Hx         Objective      Vital signs: BP 158/79 (BP Site: L Arm, BP Position: Sitting, BP Cuff Size: Medium)   Pulse 64   Ht 160 cm (5' 3)   Wt 84.8 kg (187 lb)   BMI 33.13 kg/m    Vitals:   04/13/24 1528  BP Site: L Arm  BP Position: Sitting  BP Cuff Size: Medium       General Exam: General Appearance: Well appearing. In no acute distress. HEENT: Head is atraumatic and normocephalic. Sclera anicteric without injection. Oropharyngeal membranes are moist with no erythema or exudate.  Masked facies, low blink rate. Neck: Supple. Grossly normal range of motion. Lungs: Normal work of breathing.  Heart:  Warm and well perfused. Abdomen: Soft, nontender, nondistended. Extremities: No clubbing, cyanosis, or edema.  Neurological Exam: Mental Status: The patient was alert, engaged and fully oriented. Spontaneous speech was fluent without word finding pauses, dysarthria, or paraphasic errors. Comprehension was intact. Memory for recent and remote events was intact. Cranial Nerves: Visual fields intact.  PERRL. Pursuit eye movements were uninterrupted with full range and without more than end-gaze nystagmus. Facial sensation intact bilaterally to light touch in all three divisions of CNV. Face  symmetric at rest. Normal facial movement bilaterally, including forehead, eye closure and grimace/smile. Hearing intact to conversation. Shoulder shrug full strength bilaterally. Palate movement is symmetric. Tongue protrudes midline and tongue movements are normal.  Motor Exam: Normal bulk. Normal tone in the upper and lower extremities. Re-emergent postural tremor L>R.  No action tremor.  Intermittent jaw tremor.  Intermittent rest tremor of left foot and hand.   Decrement on left finger, foot taps.    Pronator drift is absent. RUE: 5/5 grossly throughout. LUE: 5/5 grossly throughout. RLE: 5/5 grossly throughout. LLE: 5/5 grossly throughout. Reflexes: DTRs are 3+ and symmetric throughout. Toes are downgoing bilaterally. Sensory: Sensation normal to light touch and temperature sensation to cold in both hands and both feet and to vibration distally in the fingers and toes.  Cerebellar/Coordination/Gait: Rapid alternating movements are abnormal on the left. Finger-to-nose is normal without ataxia or dysmetria bilaterally. Heel-to-shin is normal without ataxia or dysmetria bilaterally. Gait exam demonstrates mild stooped posture, wide base, short stride length, and en bloc turns.  Arm swing not decreased.  Pull test with 1 large step backwards during practice run with minimal pull.    Diagnostic Studies and Review of Records  Labs: Relevant labs reviewed  Imaging: MRI brain w/wo 06/2022 There are a few foci of signal abnormality within the periventricular and deep white matter.  These are nonspecific but commonly seen with small vessel ischemic changes.      "

## 2024-04-02 ENCOUNTER — Other Ambulatory Visit: Payer: Self-pay | Admitting: Cardiology

## 2024-04-02 ENCOUNTER — Other Ambulatory Visit

## 2024-04-02 ENCOUNTER — Ambulatory Visit

## 2024-04-04 ENCOUNTER — Inpatient Hospital Stay

## 2024-04-04 ENCOUNTER — Ambulatory Visit: Payer: Self-pay | Admitting: Physician Assistant

## 2024-04-04 ENCOUNTER — Inpatient Hospital Stay: Attending: Oncology

## 2024-04-04 VITALS — BP 146/75

## 2024-04-04 DIAGNOSIS — D631 Anemia in chronic kidney disease: Secondary | ICD-10-CM | POA: Insufficient documentation

## 2024-04-04 DIAGNOSIS — N184 Chronic kidney disease, stage 4 (severe): Secondary | ICD-10-CM | POA: Insufficient documentation

## 2024-04-04 LAB — HEMOGLOBIN AND HEMATOCRIT (CANCER CENTER ONLY)
HCT: 29.5 % — ABNORMAL LOW (ref 36.0–46.0)
Hemoglobin: 9.4 g/dL — ABNORMAL LOW (ref 12.0–15.0)

## 2024-04-04 MED ORDER — EPOETIN ALFA-EPBX 20000 UNIT/ML IJ SOLN
20000.0000 [IU] | Freq: Once | INTRAMUSCULAR | Status: AC
  Administered 2024-04-04: 20000 [IU] via SUBCUTANEOUS
  Filled 2024-04-04: qty 1

## 2024-04-05 ENCOUNTER — Encounter: Payer: Self-pay | Admitting: Oncology

## 2024-04-05 ENCOUNTER — Other Ambulatory Visit: Payer: Self-pay | Admitting: Cardiology

## 2024-04-06 ENCOUNTER — Ambulatory Visit: Admitting: Clinical

## 2024-04-06 DIAGNOSIS — F411 Generalized anxiety disorder: Secondary | ICD-10-CM

## 2024-04-06 DIAGNOSIS — F330 Major depressive disorder, recurrent, mild: Secondary | ICD-10-CM

## 2024-04-06 MED ORDER — METOPROLOL TARTRATE 100 MG PO TABS: 100.0000 mg | ORAL_TABLET | Freq: Two times a day (BID) | ORAL | 3 refills | Status: AC

## 2024-04-06 MED ORDER — EZETIMIBE 10 MG PO TABS: 10.0000 mg | ORAL_TABLET | Freq: Every day | ORAL | 3 refills | Status: AC

## 2024-04-06 NOTE — Progress Notes (Signed)
 [facility] Counselor/Therapist Progress Note  Patient ID: Abigail Figueroa, MRN: [id number],    Date: 04/06/2024  Time Spent: 12:35pm - 1:20pm : 45 minutes   Treatment Type: Individual Therapy  Reported Symptoms: none reported  Mental Status Exam: Appearance:  Neat and Well Groomed     Behavior: Appropriate  Motor: Normal  Speech/Language:  Clear and Coherent and Normal Rate  Affect: Appropriate  Mood: normal  Thought process: normal  Thought content:   WNL  Sensory/Perceptual disturbances:   WNL  Orientation: oriented to person, place, time/date, and situation  Attention: Good  Concentration: Good  Memory: WNL  Fund of knowledge:  Good  Insight:   Good  Judgment:  Good  Impulse Control: Good   Risk Assessment: Danger to Self:  No Patient denied current suicidal ideation  Self-injurious Behavior: No Danger to Others: No Patient denied current homicidal ideation Duty to Warn:no Physical Aggression / Violence:No  Access to Firearms a concern: No  Gang Involvement:No   Subjective: Patient stated, going pretty good in response to events since last session. Patient stated, its a happy thing when I go to the doctor and everything is ok, I got some good news about my heart. Patient stated, I've just been enjoying the holidays, I've just really enjoyed being happy about everything that's been going. Patient stated, we've been getting along, we've been able to communicate more in reference to family dynamics. Patient stated, we've had more good times than bad times in reference to family dynamics. Patient reported patient has been spending time with family and has been able to spend time with patient's son more frequently. Patient reported patient and husband have been communicating more frequently. Patient stated, I try my best to use that in reference to therapeutic tools and has been practicing challenging cognitive distortions and practicing active  listening. Patient reported practicing challenging assumptions during recent conversations related to patient's brother. Patient stated, I like that in reference to challenging cognitive distortions. Patient stated, Its better to use the tools in reference to therapeutic tools/coping skills.  Interventions: Cognitive Behavioral Therapy. Clinician conducted session via caregility video from clinician's home office. Patient provided verbal consent to proceed with telehealth session and is aware of limitations of telephone or video visits. Patient participated in session from patient's home. Reviewed events since last session and assessed for changes. Discussed positive events since last session and explored/identified contributing factors to feelings of happiness. Reviewed patient's practice of challenging negative thoughts patterns/assumptions and the outcome.  Provided psycho related to cognitive distortions and cognitive restructuring.   Collaboration of Care: not required at this time   Diagnosis:  Mild episode of recurrent major depressive disorder (HCC)    Generalized anxiety disorder   Plan: Patient is to utilize [employer] Therapy, thought re-framing, relaxation techniques, mindfulness, effective communication, and coping strategies to decrease symptoms associated with Major Depressive Disorder and Generalized Anxiety Disorder. Frequency: bi-weekly  Modality: individual      Long-term goal:   Patient stated, I want to see a change in the anxiety.      Reduce overall level, frequency, and intensity of the symptoms of anxiety and depression as evidenced by decreased sadness, feelings of anxiety, feeling lost, lack of energy, difficulty falling and staying asleep, changes in concentration, psychomotor retardation, rapid heart rate, excessive worry, restlessness, feeling jittery, and feeling on edge from 5 to 6 days per week to 0 to 1 days per week per patient reported for at  least 3 consecutive  months.   Target Date: 09/11/24  Progress: progressing    Short-term goal:  Develop coping strategies to utilize in response to symptoms of depression/anxiety and stressors  Target Date: 09/11/24  Progress: progressing    Verbally express patient's thoughts and feelings to others and utilize effective communication strategies when expressing patient's thoughts/feelings    Target Date: 09/11/24  Progress: progressing    Verbalize an understanding of the relationship between symptoms of anxiety and the impact on patient's thought patterns and behaviors    Target Date: 09/11/24  Progress: progressing    Develop strategies for patient to utilize to support patient's daughter and grandson as it relates to grandson using illegal substances.  Target Date: 09/11/24  Progress: progressing    Develop and implement coping strategies for patient to utilize in response to grandson using illegal substances Target Date: 09/11/24  Progress: progressing     Darice Seats, LCSW

## 2024-04-06 NOTE — Progress Notes (Signed)
   Darice Seats, LCSW

## 2024-04-08 DIAGNOSIS — E118 Type 2 diabetes mellitus with unspecified complications: Secondary | ICD-10-CM | POA: Insufficient documentation

## 2024-04-08 NOTE — Progress Notes (Deleted)
 Cardiology Office Note:   Date:  04/08/2024  ID:  Abigail Figueroa, DOB 30-Jun-1960, MRN [id number] PCP: [name], MD  [facility] Providers Cardiologist:  [name], MD Cardiology APP:  [name], PA {  History of Present Illness:   Abigail Figueroa is a 63 y.o. female  female with a hx of hypertension, palpitations, hyperlipidemia, OSA on CPAP, seizure disorder, DM 2, Parkinson's disease, bilateral adrenal adenomas, primary aldosteronism, and anemia.   This is my first visit with her.  She had a heart catheterization 08/2015 with normal coronaries and normal LV function.    She has been Seen by [facility] most recently.  She is being seen for palpitations.  ***   *** his was in response to an abnormal stress test most likely due to body habitus.  Unfortunately she stopped using her CPAP machine because it started making a loud noise.  She was seen 01/09/21 with [name] NP. She reported chest discomfort and arm pain at rest that sounded atypical. No ischemic evaluation planned. I saw her in follow up 01/07/22 with better compliance on CPAP but increased sodium in her diet leading to DOE, weight gain, and lower extremity edema.  I advised to reduce salt and increase lasix  to 40 / 20 mg x 5 days, then 40 mg daily with an extra 20 mg three times weekly.    Unfortunately, BMP reveals worsening renal function, confirmed on recheck. I advised to reduce lasix  to 40 mg daily and to stay hydrated. I referred to nephrology.  Echo was repeated and showed LVEF 60-65%, grade 1 DD, normal RV, and no significant valvular disease.    When I saw her back 04/12/22 she reported palpitations several times weekly but did not want to wear a heart monitor. Chest discomfort treated medically given reassuring heart cath in 2017 and renal function precluding CCTA, prior nuclear stress test with false positive. She was doing well when she saw Dr. Burnard in Jan 2024. She saw [name] NP 07/2022. Imdur  was stopped due to persistent headaches. BP now managed with increased dose of amlodipine .   She was very concerned about sarcoid as she has a family history (mother, uncle).    Per NP Finder: We could actually do the PET scan (it will not negatively effect the kidneys as it does not use contrast eye) or an MRI. With PET we can see active inflammation but the MRI lets see better if there is or isn't sarcoid but harder to see which is new vs old. Both are reasonable choices with pros and cons.    In speaking with Dr. Loni (our cardiologist who specializes in imaging) she said we can start with either test. MRI is a long exam (probably at least 45 minutes) whereas PET is shorter test (probably 10 minutes) but there is a diet prep you have to do beforehand. With the pre-PET diet you have to do a high fat, zero carb diet for 24 hours prior to the test to get the most accurate results. Our imaging team can give more specific details on how to follow that diet one day before the CT.   I saw her for follow-up of an ER visit 07/13/2022 for chest pain and syncope which was unwitnessed.  Workup was largely negative and she was back to baseline without admission.  She had an additional possible syncopal episode when she fell back on the stairs-this episode was witnessed but unclear if  she had loss of consciousness.  Given that she had 2 episodes of questionable syncope in 6 weeks I opted to place a 30-day monitor.  Heart monitor was overall reassuring without pauses, A-fib, or significant arrhythmia without heart block.  She also had a nuclear stress test 07/21/2022 that was nonischemic.   She presents today for routine follow-up. EKG initially concerning for atypical atrial flutter. However, subsequent EKG and rhythm strip shows NSR without ST changes.    In the interim, has been diagnosed with parkinson's, primary aldosteronism, bilateral adrenal adenomas, following with endocrinology  and nephrology.    She reports palpitations over the last 2 months, but no frank chest pain.       ROS: ***  Studies Reviewed:    EKG:       ***  Risk Assessment/Calculations:   {Does this patient have ATRIAL FIBRILLATION?:[phone]} No BP recorded.  {Refresh Note OR Click here to enter BP  :1}***        Physical Exam:   VS:  LMP  (LMP Unknown)    Wt Readings from Last 3 Encounters:  02/29/24 184 lb (83.5 kg)  01/06/24 182 lb 4.8 oz (82.7 kg)  11/01/23 182 lb 11.2 oz (82.9 kg)     GEN: Well nourished, well developed in no acute distress NECK: No JVD; No carotid bruits CARDIAC: ***RR, *** murmurs, rubs, gallops RESPIRATORY:  Clear to auscultation without rales, wheezing or rhonchi  ABDOMEN: Soft, non-tender, non-distended EXTREMITIES:  No edema; No deformity   ASSESSMENT AND PLAN:   Abnormal EKG:   ***  - initial EKG today concerning for atrial flutter vs artifact - repeat EKG with SR - will defer OAC for now and place a heart monitor, obtain echo - she does report recent fatigue, but also with several new diagnoses and medication changes   Palpitations:  ***  - already on lopressor  100 mg BID - she has been bothered by palpitaions 1-2 times weekly x 2 months - palpitations last 1-2 minutes at a time - Mg 2.4 in 10/2023 - heart monitor as above   CKD:  ***  followed by nephrology Bilateral adrenal adenomas Primary aldosteronism Now followed by nephrology and endocrinology Intermittent increase in lasix  resulted in worsening renal function, difficult to control volume status with dietary indiscretion   Hypertension:  ***  - metoprolol  100 mg BID, 5 mg amlodipine , and 40 mg furosemide  as needed (has not taken in 3-4 months)  - did not tolerate isordil  due to persistent headaches   OSA:  ***  on CPAP Now compliant with new mask   Hyperlipidemia :  ***   with LDL goal < 70 Clean coronaries by cath in 2017 Continue statin and zetia  Lipid panel 09/2023: LDL  55 HDL 83 Trig 61   DM2:  ***  A1c ?5%, per patient     Follow up ***  Signed, [name], MD

## 2024-04-09 ENCOUNTER — Other Ambulatory Visit: Payer: Self-pay | Admitting: Adult Health

## 2024-04-09 ENCOUNTER — Ambulatory Visit: Admitting: Cardiology

## 2024-04-09 DIAGNOSIS — I10 Essential (primary) hypertension: Secondary | ICD-10-CM

## 2024-04-09 DIAGNOSIS — E785 Hyperlipidemia, unspecified: Secondary | ICD-10-CM

## 2024-04-09 DIAGNOSIS — R002 Palpitations: Secondary | ICD-10-CM

## 2024-04-09 DIAGNOSIS — E118 Type 2 diabetes mellitus with unspecified complications: Secondary | ICD-10-CM

## 2024-04-20 ENCOUNTER — Ambulatory Visit: Admitting: Clinical

## 2024-04-22 NOTE — Telephone Encounter (Signed)
 Patient messaged about tremor.  She trialed off Sinemet as instructed and her tremor worsened, showing that she is getting some benefit from the Sinemet.  She then restarted Sinemet at 2 pills 4 times daily.  I had been prescribing  1 pill 8AM, 1 pill noon, 1.5 pills 5PM, 1.5 pills 9PM and at her last appointment confirmed that this is what she was taking.  But she now states she was taking 2 pills 4 times daily and is tolerating this well.  She is still having a bothersome tremor and does not want to delay further management with the upcoming holiday vacation.  Plan: - Continue Sinemet 2 pills 4 times daily - Will send new prescription for pramipexole that patient can trial if tremor persists after a week of higher dose sinemet  - Patient instructed to message me after pramipexole trial with results  Comer Gleason MD Resident Physician [facility] Department of Neurology

## 2024-04-24 NOTE — Telephone Encounter (Signed)
 Patient called stating that she is wanting to increase her Ozempic  back to the 1 mg. She is requesting a call back from the provider.

## 2024-04-25 ENCOUNTER — Other Ambulatory Visit: Payer: Self-pay | Admitting: Family Medicine

## 2024-04-25 DIAGNOSIS — R569 Unspecified convulsions: Secondary | ICD-10-CM

## 2024-04-27 ENCOUNTER — Other Ambulatory Visit: Payer: Self-pay | Admitting: Family Medicine

## 2024-04-27 DIAGNOSIS — F331 Major depressive disorder, recurrent, moderate: Secondary | ICD-10-CM

## 2024-05-02 ENCOUNTER — Other Ambulatory Visit: Payer: Self-pay | Admitting: Allergy & Immunology

## 2024-05-08 ENCOUNTER — Encounter: Payer: Self-pay | Admitting: Oncology

## 2024-05-11 ENCOUNTER — Other Ambulatory Visit

## 2024-05-11 ENCOUNTER — Ambulatory Visit

## 2024-05-11 ENCOUNTER — Ambulatory Visit: Admitting: Clinical

## 2024-05-11 DIAGNOSIS — F411 Generalized anxiety disorder: Secondary | ICD-10-CM

## 2024-05-11 DIAGNOSIS — F330 Major depressive disorder, recurrent, mild: Secondary | ICD-10-CM | POA: Diagnosis not present

## 2024-05-11 NOTE — Progress Notes (Signed)
 "  [facility] Counselor/Therapist Progress Note  Patient ID: Abigail Figueroa, MRN: [id number],    Date: 05/11/2024  Time Spent: 12:36pm - 1:31pm : 55 minutes   Treatment Type: Individual Therapy  Reported Symptoms: Patient reported feeling down at the beginning of session  Mental Status Exam: Appearance:  Neat and Well Groomed     Behavior: Appropriate  Motor: Normal  Speech/Language:  Clear and Coherent and Normal Rate  Affect: Appropriate  Mood: Patient reported feeling down at the beginning of session and reported feeling better at the end of session  Thought process: normal  Thought content:   WNL  Sensory/Perceptual disturbances:   WNL  Orientation: oriented to person, place, time/date, and situation  Attention: Good  Concentration: Good  Memory: WNL  Fund of knowledge:  Good  Insight:   Good  Judgment:  Good  Impulse Control: Good   Risk Assessment: Danger to Self:  No Patient denied current suicidal ideation  Self-injurious Behavior: No Danger to Others: No Patient denied current homicidal ideation Duty to Warn:no Physical Aggression / Violence:No  Access to Firearms a concern: No  Gang Involvement:No   Subjective: Patient stated, been going pretty good in response to events since last session.  Patient stated, I've been feeling pretty good in response to mood since last session. Patient stated, I got some bad news about my medicine, they took me off my extra program where I was getting my medicine for free. Patient reported financial concerns and financial stress related to obtaining patient's medications due to loss of assistance program and costs of medication.  Patient stated, it helps me, it's making me feel better because I was down in response to medication assistance resources. Patient stated, this right here, this is good in reference to medication assistance resources. Patient stated, was very worried about it in reference to  obtaining patient's medication. Patient stated, I feel better in reference to patient's mood at end of today's session. Patient reported about a 7 out of a scale of 1 to 10 (10 being best) in reference to patient's current mood.   Interventions: Cognitive Behavioral Therapy and problem solving. Clinician conducted session via caregility video from clinician's home office. Patient provided verbal consent to proceed with telehealth session and is aware of limitations of telephone or video visits. Patient participated in session from patient's home. Reviewed events since last session and assessed for changes. Discussed patient's concerns related to loss of medication assistance program and financial impact on patient. Explored community resources for medication assistance. Clinician provided information related to community resources for medication assistance, such as, Medicaid, [employer], [employer], [place] [employer], [employer] charity care and resources identified through find help community resources. Discussed impact of current stressors on patient's mood.    Collaboration of Care: not required at this time   Diagnosis:  Mild episode of recurrent major depressive disorder (HCC)    Generalized anxiety disorder   Plan: Patient is to utilize [employer] Therapy, thought re-framing, relaxation techniques, mindfulness, effective communication, and coping strategies to decrease symptoms associated with Major Depressive Disorder and Generalized Anxiety Disorder. Frequency: bi-weekly  Modality: individual      Long-term goal:   Patient stated, I want to see a change in the anxiety.      Reduce overall level, frequency, and intensity of the symptoms of anxiety and depression as evidenced by decreased sadness, feelings of anxiety, feeling lost, lack of energy, difficulty falling and staying asleep, changes in concentration, psychomotor retardation, rapid heart  rate, excessive worry, restlessness, feeling  jittery, and feeling on edge from 5 to 6 days per week to 0 to 1 days per week per patient reported for at least 3 consecutive months.   Target Date: 09/11/24  Progress: progressing    Short-term goal:  Develop coping strategies to utilize in response to symptoms of depression/anxiety and stressors  Target Date: 09/11/24  Progress: progressing    Verbally express patient's thoughts and feelings to others and utilize effective communication strategies when expressing patient's thoughts/feelings    Target Date: 09/11/24  Progress: progressing    Verbalize an understanding of the relationship between symptoms of anxiety and the impact on patient's thought patterns and behaviors    Target Date: 09/11/24  Progress: progressing    Develop strategies for patient to utilize to support patient's daughter and grandson as it relates to grandson using illegal substances.  Target Date: 09/11/24  Progress: progressing    Develop and implement coping strategies for patient to utilize in response to grandson using illegal substances Target Date: 09/11/24  Progress: progressing     Abigail Seats, LCSW    "

## 2024-05-11 NOTE — Progress Notes (Signed)
   Darice Seats, LCSW

## 2024-05-14 ENCOUNTER — Telehealth: Payer: Self-pay

## 2024-05-14 DIAGNOSIS — G43109 Migraine with aura, not intractable, without status migrainosus: Secondary | ICD-10-CM

## 2024-05-14 NOTE — Telephone Encounter (Signed)
 Copied from CRM [id number]. Topic: Referral - Status >> May 14, 2024 11:47 AM Myrick T wrote: Reason for CRM: Pam from [facility] called to f/u on the fax sent on 1/5. Please f/u with Pam at [phone] or just co to the website [email or web address] complete form/print the info and fax#[phone]

## 2024-05-15 ENCOUNTER — Ambulatory Visit: Admitting: Allergy & Immunology

## 2024-05-15 ENCOUNTER — Ambulatory Visit: Admitting: Family Medicine

## 2024-05-15 VITALS — BP 157/81 | HR 66 | Ht 63.0 in | Wt 192.4 lb

## 2024-05-15 DIAGNOSIS — I152 Hypertension secondary to endocrine disorders: Secondary | ICD-10-CM

## 2024-05-15 DIAGNOSIS — F411 Generalized anxiety disorder: Secondary | ICD-10-CM

## 2024-05-15 DIAGNOSIS — G4733 Obstructive sleep apnea (adult) (pediatric): Secondary | ICD-10-CM

## 2024-05-15 DIAGNOSIS — N184 Chronic kidney disease, stage 4 (severe): Secondary | ICD-10-CM

## 2024-05-15 DIAGNOSIS — G43109 Migraine with aura, not intractable, without status migrainosus: Secondary | ICD-10-CM

## 2024-05-15 DIAGNOSIS — E1122 Type 2 diabetes mellitus with diabetic chronic kidney disease: Secondary | ICD-10-CM

## 2024-05-15 DIAGNOSIS — J302 Other seasonal allergic rhinitis: Secondary | ICD-10-CM

## 2024-05-15 DIAGNOSIS — G40909 Epilepsy, unspecified, not intractable, without status epilepticus: Secondary | ICD-10-CM

## 2024-05-15 DIAGNOSIS — N3941 Urge incontinence: Secondary | ICD-10-CM

## 2024-05-15 DIAGNOSIS — I5022 Chronic systolic (congestive) heart failure: Secondary | ICD-10-CM

## 2024-05-15 DIAGNOSIS — F331 Major depressive disorder, recurrent, moderate: Secondary | ICD-10-CM

## 2024-05-15 DIAGNOSIS — E1169 Type 2 diabetes mellitus with other specified complication: Secondary | ICD-10-CM

## 2024-05-15 DIAGNOSIS — G510 Bell's palsy: Secondary | ICD-10-CM

## 2024-05-15 MED ORDER — BLOOD GLUCOSE TEST VI STRP: 1.0000 | ORAL_STRIP | 3 refills | Status: AC

## 2024-05-15 MED ORDER — LANCET DEVICE MISC: 1.0000 | 0 refills | Status: AC

## 2024-05-15 MED ORDER — LANCETS MISC: 1.0000 | 0 refills | Status: AC

## 2024-05-15 NOTE — Progress Notes (Signed)
 Patient left without being seen. Patient had long wait prior to beginning of visit. Refill for glucose supplies will be sent to pharmacy and patient will reschedule with PCP for next available.

## 2024-05-15 NOTE — Telephone Encounter (Signed)
 We just need to submit a referral to neuro for headaches and mention this clinic in the comments.  The referral team takes care of submitting this in the correct portal. No true form to complete from what I see in media from 05/08/24. Ok to send referral

## 2024-05-15 NOTE — Telephone Encounter (Signed)
 Referral Placed

## 2024-05-16 ENCOUNTER — Inpatient Hospital Stay: Attending: Oncology

## 2024-05-16 ENCOUNTER — Inpatient Hospital Stay

## 2024-05-16 ENCOUNTER — Telehealth: Payer: Self-pay | Admitting: Family Medicine

## 2024-05-16 VITALS — BP 135/82

## 2024-05-16 DIAGNOSIS — D631 Anemia in chronic kidney disease: Secondary | ICD-10-CM | POA: Diagnosis present

## 2024-05-16 DIAGNOSIS — N184 Chronic kidney disease, stage 4 (severe): Secondary | ICD-10-CM | POA: Diagnosis present

## 2024-05-16 LAB — HEMOGLOBIN AND HEMATOCRIT (CANCER CENTER ONLY)
HCT: 31 % — ABNORMAL LOW (ref 36.0–46.0)
Hemoglobin: 9.9 g/dL — ABNORMAL LOW (ref 12.0–15.0)

## 2024-05-16 MED ORDER — ACCU-CHEK GUIDE W/DEVICE KIT: PACK | 0 refills | Status: AC

## 2024-05-16 MED ORDER — EPOETIN ALFA-EPBX 20000 UNIT/ML IJ SOLN
20000.0000 [IU] | Freq: Once | INTRAMUSCULAR | Status: AC
  Administered 2024-05-16: 20000 [IU] via SUBCUTANEOUS
  Filled 2024-05-16: qty 1

## 2024-05-16 NOTE — Telephone Encounter (Signed)
 [facility] faxed refill request for the following medications:  Accu check guide meter    Please advise.

## 2024-05-28 ENCOUNTER — Encounter: Payer: Self-pay | Admitting: Oncology

## 2024-06-01 ENCOUNTER — Other Ambulatory Visit: Payer: Self-pay

## 2024-06-01 DIAGNOSIS — G43109 Migraine with aura, not intractable, without status migrainosus: Secondary | ICD-10-CM

## 2024-06-01 NOTE — Progress Notes (Signed)
" ° °  06/01/2024  Abigail Figueroa  DOB: 01-24-1961 MRN: [id number]  Patient contacted pharmacist regarding high-tier medications on her medication list. It appears that most Tier 3 medications are covered around $20-30, with the exception of Emgality and brand Tegretol  XL.   Explained that [employer] offers patient assistance for [employer] and she might be eligible for this. She plans to discuss with the provider at the headache clinic.  Explained that options for brand name Tegretol  would be using a coupon, such as GoodRx (~$230 a month) or contacting insurance to see if she might be eligible to set up a medication payment plan to spread out $2100 deductible over the year.   Patient believes she will not have any issues with cost of Ozempic . Could enroll in [employer] patient assistance if needed down the road.   Provided direct line for any further questions or assistance.   Antawn Sison E. Marsh, PharmD, BCACP, CPP Clinical Pharmacist [facility] [phone]    "

## 2024-06-04 ENCOUNTER — Ambulatory Visit: Admitting: Cardiology

## 2024-06-04 ENCOUNTER — Other Ambulatory Visit: Payer: Self-pay | Admitting: Cardiology

## 2024-06-04 DIAGNOSIS — N184 Chronic kidney disease, stage 4 (severe): Secondary | ICD-10-CM

## 2024-06-04 DIAGNOSIS — I10 Essential (primary) hypertension: Secondary | ICD-10-CM

## 2024-06-04 DIAGNOSIS — E118 Type 2 diabetes mellitus with unspecified complications: Secondary | ICD-10-CM

## 2024-06-04 DIAGNOSIS — G4733 Obstructive sleep apnea (adult) (pediatric): Secondary | ICD-10-CM

## 2024-06-05 ENCOUNTER — Encounter: Payer: Self-pay | Admitting: Oncology

## 2024-06-05 ENCOUNTER — Other Ambulatory Visit: Payer: Self-pay | Admitting: Family Medicine

## 2024-06-07 ENCOUNTER — Encounter: Payer: Self-pay | Admitting: Oncology

## 2024-06-07 ENCOUNTER — Ambulatory Visit: Admitting: Family Medicine

## 2024-06-08 ENCOUNTER — Ambulatory Visit: Admitting: Clinical

## 2024-06-08 DIAGNOSIS — F330 Major depressive disorder, recurrent, mild: Secondary | ICD-10-CM

## 2024-06-08 DIAGNOSIS — F411 Generalized anxiety disorder: Secondary | ICD-10-CM

## 2024-06-08 NOTE — Progress Notes (Signed)
 "  [facility] Counselor/Therapist Progress Note  Patient ID: Abigail Figueroa, MRN: [id number],    Date: 06/08/2024  Time Spent: 12:36pm - 1:20pm : 44 minutes   Treatment Type: Individual Therapy  Reported Symptoms: none reported  Mental Status Exam: Appearance:  Casual     Behavior: Appropriate  Motor: Normal  Speech/Language:  Clear and Coherent and Normal Rate  Affect: Appropriate  Mood: normal  Thought process: normal  Thought content:   WNL  Sensory/Perceptual disturbances:   WNL  Orientation: oriented to person, place, time/date, and situation  Attention: Good  Concentration: Good  Memory: WNL  Fund of knowledge:  Good  Insight:   Good  Judgment:  Good  Impulse Control: Good   Risk Assessment: Danger to Self:  No Patient denied current suicidal ideation  Self-injurious Behavior: No Danger to Others: No Patient denied current homicidal ideation Duty to Warn:no Physical Aggression / Violence:No  Access to Firearms a concern: No  Gang Involvement:No   Subjective: Patient stated, things have been going ok, pretty good in response to events since last session.  Patient reported patient has remained at home with the exception of doctors appointments during winter weather. Patient stated, I've been doing ok, mentally still dealing about my medicines. Patient reported [employer] is providing support and guidance to assist patient in accessing medication assistance and patient's physicians are assisting patient with patient assistance program through pharmaceutical companies. Patient stated, we're trying to work on that in reference to accessing medication resources. Patient reported patient contacted DSS to start the process of applying for MQB and is awaiting paperwork. Patient stated, we'll start working on that Monday in reference to [employer]. Patient stated, my mood is pretty good today. Patient stated, I've been pretty good in  reference to mood since last session. Patient stated, its been going pretty good, it's been going ok in response to family dynamics.   Interventions: Cognitive Behavioral Therapy and problem solving. Clinician conducted session via caregility video from clinician's home office. Patient provided verbal consent to proceed with telehealth session and is aware of limitations of telephone or video visits. Patient participated in session from patient's home. Reviewed events since last session and assessed for changes. Discussed recent stressors and the impact on patient's mental health. Reviewed community resources for medication assistance and the outcome of patient's outreach to resources. Clinician provided additional information related to community resources for medication assistance. Discussed status of family dynamics. Explored and identified positives since last session.   Collaboration of Care: not required at this time   Diagnosis:  Mild episode of recurrent major depressive disorder (HCC)    Generalized anxiety disorder   Plan: Patient is to utilize [employer] Therapy, thought re-framing, relaxation techniques, mindfulness, effective communication, and coping strategies to decrease symptoms associated with Major Depressive Disorder and Generalized Anxiety Disorder. Frequency: bi-weekly  Modality: individual      Long-term goal:   Patient stated, I want to see a change in the anxiety.      Reduce overall level, frequency, and intensity of the symptoms of anxiety and depression as evidenced by decreased sadness, feelings of anxiety, feeling lost, lack of energy, difficulty falling and staying asleep, changes in concentration, psychomotor retardation, rapid heart rate, excessive worry, restlessness, feeling jittery, and feeling on edge from 5 to 6 days per week to 0 to 1 days per week per patient reported for at least 3 consecutive months.   Target Date: 09/11/24  Progress:  progressing  Short-term goal:  Develop coping strategies to utilize in response to symptoms of depression/anxiety and stressors  Target Date: 09/11/24  Progress: progressing    Verbally express patient's thoughts and feelings to others and utilize effective communication strategies when expressing patient's thoughts/feelings    Target Date: 09/11/24  Progress: progressing    Verbalize an understanding of the relationship between symptoms of anxiety and the impact on patient's thought patterns and behaviors    Target Date: 09/11/24  Progress: progressing    Develop strategies for patient to utilize to support patient's daughter and grandson as it relates to grandson using illegal substances.  Target Date: 09/11/24  Progress: progressing    Develop and implement coping strategies for patient to utilize in response to grandson using illegal substances Target Date: 09/11/24  Progress: progressing       Darice Seats, LCSW    "

## 2024-06-08 NOTE — Progress Notes (Signed)
   Darice Seats, LCSW

## 2024-06-14 ENCOUNTER — Ambulatory Visit: Admitting: Allergy & Immunology

## 2024-06-14 ENCOUNTER — Ambulatory Visit: Admitting: Cardiology

## 2024-06-19 ENCOUNTER — Other Ambulatory Visit

## 2024-06-19 ENCOUNTER — Ambulatory Visit: Admitting: Family Medicine

## 2024-06-22 ENCOUNTER — Ambulatory Visit: Admitting: Clinical

## 2024-06-22 ENCOUNTER — Ambulatory Visit

## 2024-06-22 ENCOUNTER — Ambulatory Visit: Admitting: Oncology

## 2024-06-27 ENCOUNTER — Inpatient Hospital Stay

## 2024-06-27 ENCOUNTER — Inpatient Hospital Stay: Admitting: Oncology

## 2024-12-26 ENCOUNTER — Ambulatory Visit
